# Patient Record
Sex: Male | Born: 1990 | Race: White | Marital: Single | Smoking: Never smoker
Health system: Northeastern US, Academic
[De-identification: ages and names within clinical notes are randomized; demographics above are authoritative.]

## PROBLEM LIST (undated history)

## (undated) DIAGNOSIS — G40909 Epilepsy, unspecified, not intractable, without status epilepticus: Secondary | ICD-10-CM

## (undated) DIAGNOSIS — S069X9A Unspecified intracranial injury with loss of consciousness of unspecified duration, initial encounter: Secondary | ICD-10-CM

## (undated) DIAGNOSIS — J9610 Chronic respiratory failure, unspecified whether with hypoxia or hypercapnia: Secondary | ICD-10-CM

## (undated) DIAGNOSIS — S069XAA Unspecified intracranial injury with loss of consciousness status unknown, initial encounter: Secondary | ICD-10-CM

## (undated) DIAGNOSIS — E119 Type 2 diabetes mellitus without complications: Secondary | ICD-10-CM

## (undated) DIAGNOSIS — Z9911 Dependence on respirator [ventilator] status: Secondary | ICD-10-CM

## (undated) DIAGNOSIS — J45909 Unspecified asthma, uncomplicated: Secondary | ICD-10-CM

## (undated) DIAGNOSIS — R569 Unspecified convulsions: Secondary | ICD-10-CM

## (undated) DIAGNOSIS — G8250 Quadriplegia, unspecified: Secondary | ICD-10-CM

## (undated) HISTORY — PX: SP PERC PLACE GASTRIC TUBE: HXRAD333

## (undated) HISTORY — PX: CSF SHUNT: SHX92

## (undated) HISTORY — PX: TRACHEOSTOMY TUBE PLACEMENT: SHX814

## (undated) HISTORY — DX: Unspecified intracranial injury with loss of consciousness of unspecified duration, initial encounter: S06.9X9A

## (undated) HISTORY — DX: Type 2 diabetes mellitus without complications: E11.9

## (undated) HISTORY — DX: Dependence on respirator (ventilator) status: Z99.11

## (undated) HISTORY — PX: CRANIECTOMY: SHX331

## (undated) HISTORY — PX: SHUNT REVISION: SHX343

## (undated) HISTORY — DX: Chronic respiratory failure, unspecified whether with hypoxia or hypercapnia: J96.10

## (undated) HISTORY — PX: VENTRICULOPERITONEAL SHUNT: SHX204

## (undated) HISTORY — DX: Unspecified intracranial injury with loss of consciousness status unknown, initial encounter: S06.9XAA

## (undated) HISTORY — DX: Unspecified asthma, uncomplicated: J45.909

## (undated) HISTORY — DX: Epilepsy, unspecified, not intractable, without status epilepticus: G40.909

## (undated) NOTE — Progress Notes (Signed)
Formatting of this note is different from the original.  Cardiology Consultation Note:   Etsel presents today accompanied by staff.  He is a 24 year old gentleman with a traumatic brain injury and seizure disorder as well as diabetes who has been followed closely for pulmonary issues.  An x-ray was obtained at some point suggesting the possibility of a tortuous aorta although the specifics are not available and quick review of the x-rays does not reveal significant pathology.  The aide who accompanies him today does not believe he has had any CT scanning lately.  On further history he apparently has been feeling quite well.  He has not had any cardiac specific complaints.  His blood pressure and diabetes have been under good control.  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  At night only  -Asthma  -AODM. Insulin requiring  Meds:  ? ACCU-CHEK AVIVA PLUS TEST STRP Misc Strp USE 1 STRIP BY MISC.(NON-DRUG COMBO ROUTE) ROUTE 3 (THREE) TIMES DAILY AS NEEDED.   ? acetaminophen 160 mg/5 mL (5 mL) Oral suspension (PEDS) Take 15 mg/kg by mouth every 4 (four) hours as needed for Fever.   ? albuterol 2.5 mg /3 mL (0.083 %) Inhl nebulizer solution Take 3 mLs by nebulization every 6 (six) hours as needed for Wheezing.   ? ascorbic acid, vitamin C, 250 mg Oral Chew Take 2 tablets by mouth daily.   ? BACLOFEN 10 MG Oral tablet TAKE 1 TABLET BY MOUTH TWICE A DAY   ? bisacodyl 10 mg Rect suppository Insert one suppository rectally on 3rd day if no BM   ? budesonide 0.25 mg/2 mL Inhl nebulizer solution INHALE 1 VIAL VIA NEBULIZER TWICE DAILY   ? calcium carbonate-vitamin D3 600 mg (1,500 mg)-800 unit Oral Chew Take 1 tablet by mouth daily.   ? carbamide peroxide 6.5 % Otic otic solution use 5 drops twice daily on the first 3 days of each month.   ? cetirizine 10 MG Oral tablet take 1 tablet (10 MILLIGRAM total) by  mouth nightly   ? chlorhexidine 0.12 % MM solution Use as directed 15 mLs in the mouth or throat 2 (two) times daily.   ? cholecalciferol, vitamin D3, 25 mcg (1,000 unit) Oral Chew Take 1,000 Units by mouth.   ? clonazePAM 1 MG Oral tablet 1 mg tablet crushed and put through G tube as needed for cluster seizures (tx no more than 3 back to back sz)   ? dextromethorphan-guaiFENesin 10-100 mg/5 mL Oral syrup Take 10 mLs by mouth every 4 (four) hours if needed for Cough.   ? diabetic supplies, miscellan. Misc Misc Test 6 times daily   ? docusate sodium 50 mg/5 mL Oral liquid take 10 mls (100 MILLIGRAM total) by mouth 2 times daily   ? EPINEPHrine (EPIPEN) 0.3 mg/0.3 mL Inj injection Inject 0.3 mLs into the muscle daily as needed for Anaphylaxis.   ? famotidine 20 MG Oral tablet Take 1 tablet by mouth 2 (two) times daily. Ok to crush it and put it down tube if necessary   ? fluticasone propionate 50 mcg/actuation Nasl nasal spray 1 spray by Nasal route.   ? glucagon HCL 1 mg Inj SolR Inject 1 mg as directed as needed.   ? ibuprofen 600 MG Oral tablet Take 1 tablet by mouth every 8 (eight) hours as needed for Pain.   ? insulin aspart U-100 (NOVOLOG  FLEXPEN U-100 INSULIN) 100 unit/mL (3 mL) SubQ InPn Inject 2 Units into the skin With meals, as needed. (Patient taking differently: Inject 10 Units into the skin With meals, as needed.)   ? inulin (FIBER GUMMIES ORAL) Take 2 tablets by mouth daily.   ? IPRATROPIUM 0.02 % Inhl nebulizer solution TAKE 2.5 MLS BY NEBULIZATION 4 (FOUR) TIMES DAILY.   ? lacosamide 10 mg/mL Oral oral solution Take 10 MILLILITER per tube twice daily. ; MAXIMUM DAILY DOSE 20 MILLILITER   ? LACTULOSE 10 gram/15 mL Oral solution TAKE 15 MLS BY MOUTH 3 (THREE) TIMES DAILY. (Patient taking differently: as needed. TAKE 15 MLS BY MOUTH 3 (THREE) TIMES DAILY.)   ? lancets (ACCU-CHEK SOFTCLIX LANCETS) Misc Misc 1 application by Misc.(Non-Drug; Combo Route) route 3 (three) times daily as needed. DX E11.65   ?  lancing device (LANCING DEVICE WITH LANCETS) Misc Misc 1 each by Misc.(Non-Drug; Combo Route) route 3 (three) times daily. Accuchek fastclix device   ? LEVETIRACETAM 100 mg/mL Oral solution GIVE 15 MLS BY G TUBE ROUTE 2 (TWO) TIMES DAILY.   ? metFORMIN 500 mg/5 mL Oral SSRR administer 5 mls (500 MILLIGRAM total) via g tube 2 times daily (with meals)   ? miconazole 2 % Top powder In groin   ? miscellaneous medical supply Misc Misc Adult diaper- tena  48 inch waist  DX N39.46   ? miscellaneous medical supply Misc Misc Medium gloves- one box  DX N39.46   ? miscellaneous medical supply Misc Misc chux pads  N39.46   ? miscellaneous medical supply Misc Misc Washable chux pads  DX N39.46   ? miscellaneous medical supply Misc Misc Semi electric hospital bed   ? miscellaneous medical supply Misc Misc Patient needs one box of each of the following: 60cc Cath tip piston syringes, 2x2 split gauze, 4x4 Split gauze, Long Q-tips   ? miscellaneous medical supply Misc Misc 60 cc syringes use as needed   ? miscellaneous medical supply Misc Misc Over the bed table.   ? miscellaneous medical supply Misc Misc Alcohol pads   ? miscellaneous medical supply Misc Misc Please dispense single use bullet for Trach/Nebulizer   ? miscellaneous medical supply Misc Misc Please dispense Ceiling Lift to assist with quality of life  Dx: Traumatic Brain Injury S06.9X9S, Tetraplegia G82.50   ? miscellaneous medical supply Misc Misc Please dispense one gel mattress  Dx: U04.9X9A-Traumatic Brain Injury, G82.5-Tetraplegia   ? miscellaneous medical supply Misc Misc Please dispense 14 Fr Kathryne Eriksson with extension/attachments   ? multivit-minerals/folic acid (ADULT MULTIVITAMIN GUMMIES ORAL) Take 2 tablets by mouth daily.   ? nystatin Top powder Apply 1 application topically 4 (four) times daily.   ? pen needle, diabetic (BD ULTRA-FINE MINI PEN NEEDLE) 31 gauge x 3/16" Misc Ndle USE 3 (THREE) TIMES DAILY AS NEEDED.   ? senna 8.6 mg Oral tablet administer  2 tablets via g tube daily   ? sodium chloride 0.9 % IR irrigation IRRITGATE WITH 250 MLS AS DIRECTED THREE TIMES DAILY FOR G-TUBE ADMINISTRATION WHILE ILL   ? tobramycin, PF, (TOBI) 300 mg/5 mL nebulizer solution Inhale 5 mLs into the lungs Every 12 hours.   ? TOUJEO MAX U-300 SOLOSTAR 300 unit/mL (3 mL) SubQ InPn 34 Units at bedtime.   ? VALPROATE 250 mg/5 mL Oral syrup TAKE 5 MLS BY MOUTH 3 (THREE) TIMES DAILY     Allergies:  Allergies   Allergen Reactions   ? Ativan [Lorazepam] Unknown  Unknown   ? Bactrim [Sulfamethoxazole-Trimethoprim] Anaphylaxis     Unknown   ? Zosyn [Piperacillin-Tazobactam] Other (See Comments)     BP drops   ? Vancomycin Rash     unknown     Family and Social History:  Family History   Problem Relation Age of Onset   ? Depression Mother    ? Diabetes Father    ? Heart disease Father    ? Hyperlipidemia Father    ? Hypertension Father    ? Cancer Neg Hx      Social History     Socioeconomic History   ? Marital status: Single     Spouse name: Not on file   ? Number of children: Not on file   ? Years of education: Not on file   ? Highest education level: Not on file   Occupational History   ? Not on file   Tobacco Use   ? Smoking status: Never   ? Smokeless tobacco: Never   Substance and Sexual Activity   ? Alcohol use: No   ? Drug use: Not on file   ? Sexual activity: Not on file   Other Topics Concern   ? Not on file   Social History Narrative   ? Not on file     Social Determinants of Health     Financial Resource Strain: Not on file   Food Insecurity: Not on file   Transportation Needs: Not on file   Physical Activity: Not on file   Stress: Not on file   Social Connections: Not on file   Intimate Partner Violence: Not on file   Housing Stability: Not on file     Review of Systems:  A comprehensive review of systems was performed as follows:   General:  See HPI, HEENT:  Negative, Cardiovascular: See HPI, Respiratory: See HPI,  Musculoskeletal:  Negative, GI: Negative, GU:  Negative,  Skin:  Negative,  Neurologic:  Negative, Psychiatric:  Negative, Endocrine:  Negative, Hematologic/Lymphatic:  Negative,  Allergy/Rheumatologic: Negative,  Other: Negative.     height is 5\' 7"  (1.702 m). His blood pressure is 98/62 and his pulse is 90. His oxygen saturation is 97%.    Wt Readings from Last 3 Encounters:   11/07/17 185 lb (83.9 kg)   09/12/17 185 lb (83.9 kg)   08/23/17 185 lb (83.9 kg)     General Appearance:  No acute distress   Head:  Normocephalic, without obvious abnormality, atraumatic   Eyes:  Non-jaundiced   Neck: Supple,  trachea midline; no carotid bruit. No JVD   Back:   Without obvious scoliosis, lordosis   Lungs:   Clear to auscultation bilaterally, respirations unlabored   Heart:  PMI non-displaced. Regular rate and rhythm, S1 and S2 normal, no murmurs, rub, or gallop.     Abdomen:   Soft, non-tender, bowel sounds active,  no masses, liver and spleen are not enlarged, Aorta does not appear to be enlarged.  No bruits noted   Extremities: Extremities normal, atraumatic, warm with no clubbing, cyanosis or edema   Pulses: Carotids 2+, Radial 2+ Femorals 2+ without bruit, Pedal 2+   Skin: Skin color, texture, turgor normal, no rashes or lesions       Relevant Labs:  No results found for requested labs within last 365 days.     EKG reveals sinus rhythm without acute changes    Assessment:    Keylen presents today accompanied by staff.  He has had  a prior traumatic brain injury and is unable to answer questions.  Per staff apparently some testing was obtained that did suggest the finding that needed to be reviewed.  I did spend time reviewing all tests available including his chest x-rays.  There is a referral suggesting tortuous aorta but at this point I do not see any data to confirm this.  His exam and EKG are unremarkable and according to staff he has not had any cardiac specific symptoms.  There is certainly additional testing that can be done including echocardiography or chest CT  scanning but at this point I think it may be too much and I do not believe any significant cardiac pathology is showing itself at this time.  I would favor a more conservative approach for now and we have opted to hold off on additional testing.  I remain available should any further assistance be required.    Thank you  for allowing me to participate in the care of this patient.     Electronically signed by Jerene Canny, MD at 02/23/2022 11:14 AM EDT

## (undated) NOTE — Telephone Encounter (Signed)
Formatting of this note might be different from the original.  LOV 11/19/18  No upcoming appointments scheduled.  Please schedule an appointment.    Electronically signed by Deirdre Priest, GPN at 04/13/2020  3:37 PM EDT

## (undated) NOTE — Telephone Encounter (Signed)
Formatting of this note might be different from the original.  Dr. Pete Glatter, called pharmacy.  Pharmacy is aware he as a new PCP.  Electronically signed by Kaylyn Lim at 09/19/2019  1:57 PM EST

## (undated) NOTE — Telephone Encounter (Signed)
Formatting of this note is different from the original.  Medication Refill - [facility]   Lab Results   Component Value Date    HBA1C 6.7 (H) 09/14/2017    GLU 114 (H) 10/02/2017    CA 9.0 10/02/2017    NA 133 (L) 10/02/2017    K 3.6 10/02/2017    CL 99 10/02/2017    BUN <5 (L) 10/02/2017    CREAT 0.2 (L) 10/02/2017    AST 43 (H) 09/26/2017     BP Readings from Last 1 Encounters:   11/22/17 118/72      Visit due: Yes  LOV: 11/19/18  No further OV sch    Refill done for: 180 tabs,  1 refills    Labs due: No    Labs pended: No    Prev. orders mailed: No    Notification letter sent to patient: No           AND/OR    Through My Chart, if active: No   Goals    None          Electronically signed by Georgia Duff, LPN at 16/04/9603  3:53 PM EDT

## (undated) NOTE — Telephone Encounter (Signed)
Formatting of this note is different from the original.  LOV 11/20/2018  Medication Refill - [facility]   Lab Results   Component Value Date    HBA1C 6.7 (H) 09/14/2017    GLU 114 (H) 10/02/2017    CA 9.0 10/02/2017    NA 133 (L) 10/02/2017    K 3.6 10/02/2017    CL 99 10/02/2017    BUN <5 (L) 10/02/2017    CREAT 0.2 (L) 10/02/2017    AST 43 (H) 09/26/2017     BP Readings from Last 1 Encounters:   11/22/17 118/72      Visit due: No    Refill done for: 100 tabs,  1 refills    Labs due: No    Labs pended: No    Prev. orders mailed: No    Notification letter sent to patient: No           AND/OR    Through My Chart, if active: No   Goals    None          Electronically signed by Trudi Ida at 05/19/2019 12:35 PM EST

## (undated) NOTE — Telephone Encounter (Signed)
Formatting of this note might be different from the original.  No longer pt here per chart, decline medication.  Electronically signed by Gasper Sells at 04/13/2020  3:49 PM EDT

## (undated) NOTE — Telephone Encounter (Signed)
Formatting of this note might be different from the original.  No longer our patient  Electronically signed by Alinda Dooms, LPN at 16/04/9603 12:49 PM EST

## (undated) NOTE — Telephone Encounter (Signed)
Formatting of this note might be different from the original.  Dr Pete Glatter, please decline. no longer our pt.  Electronically signed by Anastasia Pall, LPN at 16/04/9603  4:43 PM EST

## (undated) NOTE — Telephone Encounter (Signed)
Formatting of this note might be different from the original.  Pt no longer receives care in this office.  Electronically signed by Anastasia Pall, LPN at 16/04/9603  9:25 AM EST

## (undated) NOTE — Telephone Encounter (Signed)
Formatting of this note might be different from the original.  LOV 11/19/18  Electronically signed by Milas Gain at 07/05/2020 12:50 PM EST

## (undated) NOTE — Telephone Encounter (Signed)
Formatting of this note might be different from the original.  LOV 11/19/18    No NOV scheduled  Electronically signed by Kaylyn Lim at 07/01/2019 12:16 PM EST

---
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
## 2015-01-08 ENCOUNTER — Emergency Department: Payer: Medicaid Other

## 2015-01-08 ENCOUNTER — Emergency Department
Admission: EM | Admit: 2015-01-08 | Discharge: 2015-01-08 | Disposition: A | Discharge status: Home / Self Care | Payer: Medicaid Other | Attending: Student | Admitting: Student

## 2015-01-08 DIAGNOSIS — J9501 Hemorrhage from tracheostomy stoma: Secondary | ICD-10-CM | POA: Insufficient documentation

## 2015-01-08 DIAGNOSIS — E119 Type 2 diabetes mellitus without complications: Secondary | ICD-10-CM | POA: Diagnosis not present

## 2015-01-08 DIAGNOSIS — Z880 Allergy status to penicillin: Secondary | ICD-10-CM | POA: Diagnosis not present

## 2015-01-08 DIAGNOSIS — J159 Unspecified bacterial pneumonia: Secondary | ICD-10-CM | POA: Insufficient documentation

## 2015-01-08 DIAGNOSIS — J189 Pneumonia, unspecified organism: Secondary | ICD-10-CM

## 2015-01-08 HISTORY — DX: Unspecified asthma, uncomplicated: J45.909

## 2015-01-08 HISTORY — DX: Unspecified convulsions: R56.9

## 2015-01-08 HISTORY — DX: Unspecified intracranial injury with loss of consciousness of unspecified duration, initial encounter: S06.9X9A

## 2015-01-08 HISTORY — DX: Type 2 diabetes mellitus without complications: E11.9

## 2015-01-08 MED ORDER — LEVOFLOXACIN 750 MG PO TABS
ORAL_TABLET | ORAL | Status: AC
  Filled 2015-01-08: qty 1

## 2015-01-08 MED ORDER — LEVOFLOXACIN 250 MG PO TABS
ORAL_TABLET | ORAL | Status: AC
  Filled 2015-01-08: qty 3

## 2015-01-08 MED ORDER — LEVOFLOXACIN 750 MG PO TABS: 750.0000 mg | ORAL_TABLET | Freq: Every day | ORAL | Status: DC

## 2015-01-08 MED ORDER — LEVOFLOXACIN 750 MG PO TABS
750.0000 mg | ORAL_TABLET | Freq: Once | ORAL | Status: AC
  Administered 2015-01-08: 750 mg

## 2015-01-08 NOTE — ED Provider Notes (Signed)
[facility] Emergency Department Provider Note  ____________________________________________  Time seen: Approximately 3:33 PM  I have reviewed the triage vital signs and the nursing notes.   HISTORY  Chief Complaint Hemoptysis  Caveat-history of present illness and review of systems Limited secondary to the patient's traumatic brain injury. All history of present illness and review of systems obtained from family at bedside as well as the patient's day nurse.  HPI Roger Tate is a 24 y.o. 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 Family at bedside reports approximately 2 days ago, there was concern for aspiration and the patient's day nurse "suctioned him 8 times in one hour". Last night there is a small amount of pink tinged sputum. Today, caregiver suctioned approximately 100 cc of frank blood from the trach however this has nearly resolved. Current severity is mild. Otherwise, he has had no fevers, chills, vomiting, diarrhea. He has been in good spirits. Father has observed him his does not think he is having any kind of pain. Father confirms that his tracheostomy tube was initially placed in 2002. Father does report that one time in the past when the patient had pneumonia he did have some blood from the trach.   Past Medical History  Diagnosis Date  . Brain injury 2002  . Diabetes mellitus without complication   . Seizures   . Asthma     There are no active problems to display for this patient.   Past Surgical History  Procedure Laterality Date  . Brain surgery  2002  . Tracheostomy tube placement    . Sp perc place gastric tube    . Csf shunt      Current Outpatient Rx  Name  Route  Sig  Dispense  Refill  . levofloxacin (LEVAQUIN) 750 MG tablet   Per Tube  Place 1 tablet (750 mg total) into feeding tube daily.   4 tablet   0     Allergies Sulfa antibiotics; Zosyn; and Penicillins  History reviewed. No pertinent family history.  Social History History  Substance Use Topics  . Smoking status: Never Smoker   . Smokeless tobacco: Not on file  . Alcohol Use: No    Review of Systems Constitutional: No fever/chills Gastrointestinal:  No nausea, no vomiting.  No diarrhea.  No constipation. Skin: Negative for rash.   Caveat-history of present illness and review of systems Limited secondary to the patient's tract brain injury. All history of present illness and review of systems obtained from family at bedside as well as the patient's day nurse. ____________________________________________   PHYSICAL EXAM:  VITAL SIGNS: ED Triage Vitals  Enc Vitals Group     BP 01/08/15 1523 105/74 mmHg     Pulse Rate 01/08/15 1523 95     Resp --      Temp 01/08/15 1512 98.4 F (36.9 C)     Temp Source 01/08/15 1512 Oral     SpO2 01/08/15 1523 96 %     Weight 01/08/15 1512 175 lb (79.379 kg)     Height 01/08/15 1512  (1.651 m)     Head Cir --      Peak Flow --      Pain Score 01/08/15 1513 0     Pain Loc --      Pain Edu? --  Excl. in GC? --     Constitutional: Alert, delayed but pleasant cooperative and answers most questions appropriately. Eyes: Conjunctivae are normal. PERRL. EOMI. Head: Atraumatic. Nose: No congestion/rhinnorhea. Mouth/Throat: Mucous membranes are moist.  Oropharynx non-erythematous. Neck: + tracheostomy in anterior neck with no active bleeding. Cardiovascular: Normal rate, regular rhythm. Grossly normal heart sounds.  Good peripheral circulation. Respiratory: Normal respiratory effort.  No retractions. Slightly coarse breath sounds bilaterally Gastrointestinal: Soft and nontender. No distention. No abdominal bruits. No CVA tenderness. Genitourinary: deferred. +wearing adult diaper. Musculoskeletal: No  lower extremity tenderness nor edema.  No joint effusions. Neurologic:  Patient answers most questions appropriately. He follows commands. He has chronic muscle wasting in bilateral upper and lower extremities with limb contractures, clonus in bilateral lower extremities. Skin:  Skin is warm, dry and intact. No rash noted. Psychiatric: Mood and affect are normal. Speech and behavior are normal.  ____________________________________________   LABS (all labs ordered are listed, but only abnormal results are displayed)  Labs Reviewed - No data to display ____________________________________________  EKG  none ____________________________________________  RADIOLOGY  CXR IMPRESSION: 1. Tracheostomy appears in good position. 2. LEFT basilar opacity which may represent atelectasis or airspace disease.  ____________________________________________   PROCEDURES  Procedure(s) performed: None  Critical Care performed: No  ____________________________________________   INITIAL IMPRESSION / ASSESSMENT AND PLAN / ED COURSE  Pertinent labs & imaging results that were available during my care of the patient were reviewed by me and considered in my medical decision making (see chart for details).   Roger Tate is a 24 y.o. 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 On exam, he is nontoxic appearing. Vital signs are stable, he is afebrile. Family at bedside reports that he is at his baseline in terms of mental status, behavior. Respiratory has suction the patient and there is a small amount of blood-tinged mucus from the trach but there is no gross blood. Suspect minor trauma in the setting of over suctioning however at this point no frank lead from the trachea ostomy. Additionally his stoma is very  mature at this point, having been in for 14 years. I discussed plan of care with the parents, we have decided that we will try to afford lab work as the patient is a very difficult stick and it is very unlikely that he has sustained a significant amount of blood loss given that he remains so hemodynamically stable and the bleeding has nearly resolved completely. We'll check a chest x-ray. Anticipate discharge.  ----------------------------------------- 5:55 PM on 01/08/2015 -----------------------------------------  Chest x-ray with possible left basilar opacity. Will Treat with Levaquin. O2 saturation 92-96% which father reports is the patient's baseline O2 saturation. We'll give first dose of Levaquin here. Discussed extensive return precautions with the patient's father and stepmother and they're comfortable with discharge plan. No continued bleeding from the tracheostomy site at this time. ____________________________________________   FINAL CLINICAL IMPRESSION(S) / ED DIAGNOSES  Final diagnoses:  Tracheostomy hemorrhage  Community acquired pneumonia      Gayla DossEryka A Emali Heyward, MD 01/09/15 [phone]

## 2015-01-08 NOTE — Discharge Instructions (Signed)
Roger Tate to return to the emergency department immediately if you again are noting a significant amount of blood from the tracheostomy, he is having fevers, difficulty breathing, if his oxygen level is low, if he is not acting like himself/seems lethargic or is agitated, he is vomiting or having diarrhea, or for any other concerns. Otherwise, see his doctor as soon as possible for follow-up.

## 2015-01-08 NOTE — ED Notes (Signed)
Waiting for EMS transport home, family at bedise

## 2015-01-08 NOTE — ED Notes (Signed)
Pt presents to ED via EMS.  Per EMS, pts home health nurse suctioned approx 100 cc of blood from trach this morning.  Pts home health sts pts HR has been elevated for pt, btw 80-90.  Pt denies pain.

## 2015-01-14 ENCOUNTER — Inpatient Hospital Stay
Admission: EM | Admit: 2015-01-14 | Discharge: 2015-01-19 | DRG: 870 | Disposition: A | Discharge status: Home Health | Payer: Medicaid Other | Attending: Internal Medicine | Admitting: Internal Medicine

## 2015-01-14 ENCOUNTER — Emergency Department: Payer: Medicaid Other

## 2015-01-14 ENCOUNTER — Encounter: Payer: Self-pay | Admitting: Emergency Medicine

## 2015-01-14 DIAGNOSIS — E872 Acidosis: Secondary | ICD-10-CM | POA: Diagnosis present

## 2015-01-14 DIAGNOSIS — Z880 Allergy status to penicillin: Secondary | ICD-10-CM | POA: Diagnosis not present

## 2015-01-14 DIAGNOSIS — Z9889 Other specified postprocedural states: Secondary | ICD-10-CM

## 2015-01-14 DIAGNOSIS — J45909 Unspecified asthma, uncomplicated: Secondary | ICD-10-CM | POA: Diagnosis present

## 2015-01-14 DIAGNOSIS — Z79899 Other long term (current) drug therapy: Secondary | ICD-10-CM | POA: Diagnosis not present

## 2015-01-14 DIAGNOSIS — R561 Post traumatic seizures: Secondary | ICD-10-CM | POA: Diagnosis not present

## 2015-01-14 DIAGNOSIS — M419 Scoliosis, unspecified: Secondary | ICD-10-CM

## 2015-01-14 DIAGNOSIS — M24642 Ankylosis, left hand: Secondary | ICD-10-CM

## 2015-01-14 DIAGNOSIS — Z87820 Personal history of traumatic brain injury: Secondary | ICD-10-CM | POA: Diagnosis not present

## 2015-01-14 DIAGNOSIS — A4102 Sepsis due to Methicillin resistant Staphylococcus aureus: Principal | ICD-10-CM | POA: Diagnosis present

## 2015-01-14 DIAGNOSIS — Z883 Allergy status to other anti-infective agents status: Secondary | ICD-10-CM | POA: Diagnosis not present

## 2015-01-14 DIAGNOSIS — J9601 Acute respiratory failure with hypoxia: Secondary | ICD-10-CM | POA: Diagnosis present

## 2015-01-14 DIAGNOSIS — J9621 Acute and chronic respiratory failure with hypoxia: Secondary | ICD-10-CM | POA: Diagnosis present

## 2015-01-14 DIAGNOSIS — Z982 Presence of cerebrospinal fluid drainage device: Secondary | ICD-10-CM

## 2015-01-14 DIAGNOSIS — J690 Pneumonitis due to inhalation of food and vomit: Secondary | ICD-10-CM | POA: Diagnosis present

## 2015-01-14 DIAGNOSIS — Z7951 Long term (current) use of inhaled steroids: Secondary | ICD-10-CM | POA: Diagnosis not present

## 2015-01-14 DIAGNOSIS — R0902 Hypoxemia: Secondary | ICD-10-CM

## 2015-01-14 DIAGNOSIS — E1165 Type 2 diabetes mellitus with hyperglycemia: Secondary | ICD-10-CM | POA: Diagnosis present

## 2015-01-14 DIAGNOSIS — Z794 Long term (current) use of insulin: Secondary | ICD-10-CM

## 2015-01-14 DIAGNOSIS — J189 Pneumonia, unspecified organism: Secondary | ICD-10-CM

## 2015-01-14 DIAGNOSIS — Z9911 Dependence on respirator [ventilator] status: Secondary | ICD-10-CM | POA: Diagnosis not present

## 2015-01-14 DIAGNOSIS — Z931 Gastrostomy status: Secondary | ICD-10-CM

## 2015-01-14 DIAGNOSIS — Z930 Tracheostomy status: Secondary | ICD-10-CM

## 2015-01-14 DIAGNOSIS — A419 Sepsis, unspecified organism: Secondary | ICD-10-CM

## 2015-01-14 DIAGNOSIS — J181 Lobar pneumonia, unspecified organism: Secondary | ICD-10-CM

## 2015-01-14 DIAGNOSIS — G8220 Paraplegia, unspecified: Secondary | ICD-10-CM | POA: Diagnosis present

## 2015-01-14 DIAGNOSIS — G40909 Epilepsy, unspecified, not intractable, without status epilepticus: Secondary | ICD-10-CM | POA: Diagnosis present

## 2015-01-14 DIAGNOSIS — Z882 Allergy status to sulfonamides status: Secondary | ICD-10-CM | POA: Diagnosis not present

## 2015-01-14 DIAGNOSIS — I959 Hypotension, unspecified: Secondary | ICD-10-CM

## 2015-01-14 LAB — CBC WITH DIFFERENTIAL/PLATELET
BASOS ABS: 0 10*3/uL (ref 0–0.1)
Basophils Relative: 0 %
EOS ABS: 0 10*3/uL (ref 0–0.7)
EOS PCT: 0 %
HCT: 39 % — ABNORMAL LOW (ref 40.0–52.0)
HEMOGLOBIN: 13.3 g/dL (ref 13.0–18.0)
Lymphocytes Relative: 18 %
Lymphs Abs: 2.2 10*3/uL (ref 1.0–3.6)
MCH: 31.5 pg (ref 26.0–34.0)
MCHC: 34.2 g/dL (ref 32.0–36.0)
MCV: 92.1 fL (ref 80.0–100.0)
MONO ABS: 1 10*3/uL (ref 0.2–1.0)
MONOS PCT: 8 %
Neutro Abs: 8.7 10*3/uL — ABNORMAL HIGH (ref 1.4–6.5)
Neutrophils Relative %: 74 %
Platelets: 235 10*3/uL (ref 150–440)
RBC: 4.23 MIL/uL — ABNORMAL LOW (ref 4.40–5.90)
RDW: 13.3 % (ref 11.5–14.5)
WBC: 11.9 10*3/uL — ABNORMAL HIGH (ref 3.8–10.6)

## 2015-01-14 LAB — COMPREHENSIVE METABOLIC PANEL
ALK PHOS: 59 U/L (ref 38–126)
ALT: 108 U/L — AB (ref 17–63)
AST: 104 U/L — ABNORMAL HIGH (ref 15–41)
Albumin: 3.5 g/dL (ref 3.5–5.0)
Anion gap: 12 (ref 5–15)
BUN: 7 mg/dL (ref 6–20)
CO2: 23 mmol/L (ref 22–32)
Calcium: 9.3 mg/dL (ref 8.9–10.3)
Chloride: 100 mmol/L — ABNORMAL LOW (ref 101–111)
Creatinine, Ser: 0.44 mg/dL — ABNORMAL LOW (ref 0.61–1.24)
GFR calc Af Amer: >60 mL/min (ref >60)
GFR calc non Af Amer: >60 mL/min (ref >60)
GLUCOSE: 136 mg/dL — AB (ref 65–99)
Potassium: 3.6 mmol/L (ref 3.5–5.1)
SODIUM: 135 mmol/L (ref 135–145)
Total Bilirubin: 0.8 mg/dL (ref 0.3–1.2)
Total Protein: 8.2 g/dL — ABNORMAL HIGH (ref 6.5–8.1)

## 2015-01-14 LAB — LACTIC ACID, PLASMA
Lactic Acid, Venous: 2.9 mmol/L (ref 0.5–2.0)
Lactic Acid, Venous: 1.9 mmol/L (ref 0.5–2.0)

## 2015-01-14 LAB — EXPECTORATED SPUTUM ASSESSMENT W GRAM STAIN, RFLX TO RESP C

## 2015-01-14 LAB — EXPECTORATED SPUTUM ASSESSMENT W REFEX TO RESP CULTURE

## 2015-01-14 LAB — TSH: TSH: 2.769 u[IU]/mL (ref 0.350–4.500)

## 2015-01-14 LAB — GLUCOSE, CAPILLARY: Glucose-Capillary: 122 mg/dL — ABNORMAL HIGH (ref 65–99)

## 2015-01-14 MED ORDER — ALBUTEROL SULFATE (2.5 MG/3ML) 0.083% IN NEBU: 2.5000 mg | INHALATION_SOLUTION | Freq: Four times a day (QID) | RESPIRATORY_TRACT | Status: DC | PRN

## 2015-01-14 MED ORDER — ONDANSETRON HCL 4 MG PO TABS: 4.0000 mg | ORAL_TABLET | Freq: Four times a day (QID) | ORAL | Status: DC | PRN

## 2015-01-14 MED ORDER — SODIUM CHLORIDE 0.9 % IV SOLN
1.0000 g | Freq: Three times a day (TID) | INTRAVENOUS | Status: DC
  Filled 2015-01-14 (×4): qty 1

## 2015-01-14 MED ORDER — RANITIDINE HCL 75 MG/5ML PO SYRP
150.0000 mg | ORAL_SOLUTION | Freq: Two times a day (BID) | ORAL | Status: DC
  Filled 2015-01-14 (×13): qty 10

## 2015-01-14 MED ORDER — CIPROFLOXACIN IN D5W 400 MG/200ML IV SOLN
400.0000 mg | Freq: Once | INTRAVENOUS | Status: AC
  Administered 2015-01-14: 400 mg via INTRAVENOUS

## 2015-01-14 MED ORDER — ACETAMINOPHEN 325 MG PO TABS
650.0000 mg | ORAL_TABLET | Freq: Four times a day (QID) | ORAL | Status: DC | PRN
  Administered 2015-01-14 – 2015-01-18 (×2): 650 mg via ORAL
  Filled 2015-01-14 (×2): qty 2

## 2015-01-14 MED ORDER — SODIUM CHLORIDE 0.9 % IV SOLN
1000.0000 mL | Freq: Once | INTRAVENOUS | Status: AC
  Administered 2015-01-14: 1000 mL via INTRAVENOUS

## 2015-01-14 MED ORDER — VALPROIC ACID 250 MG PO CAPS
500.0000 mg | ORAL_CAPSULE | Freq: Two times a day (BID) | ORAL | Status: DC
  Filled 2015-01-14 (×3): qty 2

## 2015-01-14 MED ORDER — LINEZOLID 600 MG/300ML IV SOLN
600.0000 mg | Freq: Two times a day (BID) | INTRAVENOUS | Status: DC
  Administered 2015-01-14 – 2015-01-19 (×10): 600 mg via INTRAVENOUS
  Filled 2015-01-14 (×13): qty 300

## 2015-01-14 MED ORDER — CIPROFLOXACIN IN D5W 400 MG/200ML IV SOLN
INTRAVENOUS | Status: AC
  Administered 2015-01-14: 400 mg via INTRAVENOUS
  Filled 2015-01-14: qty 200

## 2015-01-14 MED ORDER — RANITIDINE HCL 150 MG/10ML PO SYRP
150.0000 mg | ORAL_SOLUTION | Freq: Two times a day (BID) | ORAL | Status: DC
  Administered 2015-01-15: 10 mg via ORAL
  Administered 2015-01-15 – 2015-01-19 (×8): 150 mg via ORAL
  Filled 2015-01-14 (×11): qty 10

## 2015-01-14 MED ORDER — VALPROIC ACID 250 MG PO CAPS
750.0000 mg | ORAL_CAPSULE | Freq: Every day | ORAL | Status: DC
  Administered 2015-01-14: 750 mg via ORAL
  Filled 2015-01-14 (×2): qty 3

## 2015-01-14 MED ORDER — ONDANSETRON HCL 4 MG/2ML IJ SOLN: 4.0000 mg | Freq: Four times a day (QID) | INTRAMUSCULAR | Status: DC | PRN

## 2015-01-14 MED ORDER — METHYLPREDNISOLONE SODIUM SUCC 125 MG IJ SOLR
INTRAMUSCULAR | Status: AC
  Filled 2015-01-14: qty 2

## 2015-01-14 MED ORDER — HEPARIN SODIUM (PORCINE) 5000 UNIT/ML IJ SOLN
5000.0000 [IU] | Freq: Three times a day (TID) | INTRAMUSCULAR | Status: DC
  Administered 2015-01-15 – 2015-01-19 (×10): 5000 [IU] via SUBCUTANEOUS
  Filled 2015-01-14 (×9): qty 1

## 2015-01-14 MED ORDER — METHYLPREDNISOLONE SODIUM SUCC 125 MG IJ SOLR: 60.0000 mg | Freq: Four times a day (QID) | INTRAMUSCULAR | Status: DC

## 2015-01-14 MED ORDER — DIPHENHYDRAMINE HCL 50 MG/ML IJ SOLN
25.0000 mg | Freq: Once | INTRAMUSCULAR | Status: AC
  Administered 2015-01-14: 25 mg via INTRAVENOUS

## 2015-01-14 MED ORDER — BACLOFEN 10 MG PO TABS
10.0000 mg | ORAL_TABLET | Freq: Two times a day (BID) | ORAL | Status: DC
  Administered 2015-01-15 – 2015-01-19 (×9): 10 mg via ORAL
  Filled 2015-01-14 (×8): qty 1

## 2015-01-14 MED ORDER — MEROPENEM 1 G IV SOLR
1.0000 g | Freq: Once | INTRAVENOUS | Status: DC
  Filled 2015-01-14: qty 1

## 2015-01-14 MED ORDER — LACTULOSE 10 GM/15ML PO SOLN
20.0000 g | Freq: Two times a day (BID) | ORAL | Status: DC
  Administered 2015-01-15 – 2015-01-18 (×6): 20 g via ORAL
  Filled 2015-01-14 (×5): qty 30

## 2015-01-14 MED ORDER — LEVETIRACETAM 250 MG PO TABS
250.0000 mg | ORAL_TABLET | Freq: Two times a day (BID) | ORAL | Status: DC
  Filled 2015-01-14 (×3): qty 1

## 2015-01-14 MED ORDER — SODIUM CHLORIDE 0.9 % IV SOLN
1.0000 g | Freq: Three times a day (TID) | INTRAVENOUS | Status: DC
  Administered 2015-01-14 – 2015-01-17 (×9): 1 g via INTRAVENOUS
  Filled 2015-01-14 (×13): qty 1

## 2015-01-14 MED ORDER — ACETAMINOPHEN 650 MG RE SUPP: 650.0000 mg | Freq: Four times a day (QID) | RECTAL | Status: DC | PRN

## 2015-01-14 MED ORDER — SODIUM CHLORIDE 0.9 % IV SOLN: 500.0000 mg | Freq: Three times a day (TID) | INTRAVENOUS | Status: DC

## 2015-01-14 MED ORDER — SODIUM CHLORIDE 0.9 % IV SOLN
INTRAVENOUS | Status: DC
  Administered 2015-01-14 – 2015-01-19 (×7): via INTRAVENOUS

## 2015-01-14 MED ORDER — CLONAZEPAM 0.5 MG PO TABS: 0.5000 mg | ORAL_TABLET | Freq: Two times a day (BID) | ORAL | Status: DC | PRN

## 2015-01-14 NOTE — ED Notes (Signed)
Lab notified about missing results for CMP. Spoke with Darl PikesSusan who just came on shift and does not know why it was not ran, but is running the lab work right now.

## 2015-01-14 NOTE — ED Notes (Signed)
Family at bedside. 

## 2015-01-14 NOTE — Progress Notes (Signed)
Pt has a #7 Bivona trach that requires sterile water to maintain a cuff pressure. 3-4 cc's of sterile water per RT and/or RN

## 2015-01-14 NOTE — Progress Notes (Signed)
ANTIBIOTIC CONSULT NOTE - INITIAL  Pharmacy Consult for Meropenem Indication: rule out sepsis, HCAP   Allergies  Allergen Reactions  . Sulfa Antibiotics Anaphylaxis  . Zosyn [Piperacillin-Tazobactam In Dex] Anaphylaxis  . Vancomycin   . Penicillins Rash    Patient Measurements: Height: 5\' 5"  (165.1 cm) Weight: 175 lb (79.379 kg) IBW/kg (Calculated) : 61.5 Adjusted Body Weight:   Vital Signs: Temp: 98.2 F (36.8 C) (07/07 1223) Temp Source: Oral (07/07 1223) BP: 123/88 mmHg (07/07 1800) Pulse Rate: 84 (07/07 1800) Intake/Output from previous day:   Intake/Output from this shift: Total I/O In: 350 [I.V.:250; IV Piggyback:100] Out: -   Labs:  Recent Labs  01/14/15 1228  WBC 11.9*  HGB 13.3  PLT 235  CREATININE 0.44*   Estimated Creatinine Clearance: 140.7 mL/min (by C-G formula based on Cr of 0.44). No results for input(s): VANCOTROUGH, VANCOPEAK, VANCORANDOM, GENTTROUGH, GENTPEAK, GENTRANDOM, TOBRATROUGH, TOBRAPEAK, TOBRARND, AMIKACINPEAK, AMIKACINTROU, AMIKACIN in the last 72 hours.   Microbiology: Recent Results (from the past 720 hour(s))  Culture, expectorated sputum-assessment     Status: None   Collection Time: 01/14/15 12:45 PM  Result Value Ref Range Status   Specimen Description ENDOTRACHEAL  Final   Special Requests NONE  Final   Sputum evaluation THIS SPECIMEN IS ACCEPTABLE FOR SPUTUM CULTURE  Final   Report Status 01/14/2015 FINAL  Final    Medical History: Past Medical History  Diagnosis Date  . Brain injury 2002  . Diabetes mellitus without complication   . Seizures   . Asthma     Medications:  Scheduled:  . heparin  5,000 Units Subcutaneous 3 times per day  . levETIRAcetam  250 mg Oral BID  . linezolid  600 mg Intravenous Q12H  . meropenem (MERREM) IV  1 g Intravenous 3 times per day   Assessment: CrCl = 140.7 ml/min Pt has listed anaphylaxis to Zosyn,  Incidence of cross sensitivity between PCN's and carbapenems is ~ 1 %.  MD  aware of PCN allergy.  Goal of Therapy:  resolution of infection  Plan:  Expected duration 7 days with resolution of temperature and/or normalization of WBC  Meropenem 500 mg IV Q8H originally ordered.  Will adjust dose to meropenem 1 gm IV Q8H based on indication (HCAP).   Aviella Disbrow D 01/14/2015,6:31 PM

## 2015-01-14 NOTE — ED Provider Notes (Signed)
[facility] Emergency Department Provider Note  ____________________________________________  Time seen: On arrival, via EMS  I have reviewed the triage vital signs and the nursing notes.   HISTORY  Chief Complaint Respiratory Distress  History limited secondary to traumatic brain injury  HPI Georgios Kina is a 24 y.o. male who presents with shortness of breath/hypoxia. Patient with a history of TBI, with a tracheostomy who is on a vent at night. Reportedly patient seen in the emergency department last week and started on antibiotics for possible pneumonia. This morning home health nurse unable to disconnect patient from the event. She reports he would immediately desaturate to the 30s and turned blue. Father seems to think that an outpatient course of Cipro will fix this. Patient with multiple allergies     Past Medical History  Diagnosis Date  . Brain injury 2002  . Diabetes mellitus without complication   . Seizures   . Asthma     There are no active problems to display for this patient.   Past Surgical History  Procedure Laterality Date  . Brain surgery  2002  . Tracheostomy tube placement    . Sp perc place gastric tube    . Csf shunt      Current Outpatient Rx  Name  Route  Sig  Dispense  Refill  . levofloxacin (LEVAQUIN) 750 MG tablet   Per Tube   Place 1 tablet (750 mg total) into feeding tube daily.   4 tablet   0     Allergies Sulfa antibiotics; Zosyn; Vancomycin; and Penicillins  History reviewed. No pertinent family history.  Social History History  Substance Use Topics  . Smoking status: Never Smoker   . Smokeless tobacco: Not on file  . Alcohol Use: No    Review of Systems  L5 caveat: Patient  unable to provide full review of symptoms secondary to past TBI ROS per father  Father does not report fever at home. Positive shortness of breath No vomiting or diarrhea No  rash ____________________________________________   PHYSICAL EXAM:  VITAL SIGNS: ED Triage Vitals  Enc Vitals Group     BP 01/14/15 1223 107/74 mmHg     Pulse Rate 01/14/15 1223 96     Resp 01/14/15 1223 23     Temp 01/14/15 1223 98.2 F (36.8 C)     Temp Source 01/14/15 1223 Oral     SpO2 01/14/15 1217 98 %     Weight 01/14/15 1223 175 lb (79.379 kg)     Height 01/14/15 1223  (1.651 m)     Head Cir --      Peak Flow --      Pain Score --      Pain Loc --      Pain Edu? --      Excl. in GC? --      Constitutional: Alert, ill-appearing Eyes: Conjunctivae are normal.  ENT   Head: Normocephalic and atraumatic.   Mouth/Throat: Mucous membranes are dry Cardiovascular: Normal rate, regular rhythm. Normal and symmetric distal pulses are present in all extremities. No murmurs, rubs, or gallops. Respiratory: Positive Rales bilaterally. Tachypnea. Patient being bagged by EMS via trach Gastrointestinal: Soft and non-tender in all quadrants. No distention. There is no CVA tenderness. Genitourinary: deferred Musculoskeletal: Atrophy of bilateral lower extremities, no erythema or rash  Skin:  Skin is warm, damp and intact. No rash noted.   ____________________________________________    LABS (pertinent positives/negatives)  Labs Reviewed  CBC WITH  DIFFERENTIAL/PLATELET - Abnormal; Notable for the following:    WBC 11.9 (*)    RBC 4.23 (*)    HCT 39.0 (*)    Neutro Abs 8.7 (*)    All other components within normal limits  CULTURE, BLOOD (ROUTINE X 2)  CULTURE, BLOOD (ROUTINE X 2)  URINE CULTURE  CULTURE, EXPECTORATED SPUTUM-ASSESSMENT  LACTIC ACID, PLASMA  LACTIC ACID, PLASMA  COMPREHENSIVE METABOLIC PANEL  URINALYSIS COMPLETEWITH MICROSCOPIC ([facility] ONLY)    ____________________________________________   EKG  ED ECG REPORT I, Jene EveryKINNER, Vinay Ertl, the attending physician, personally viewed and interpreted this ECG.   Date: 01/14/2015  EKG Time: 12:18 PM   Rate: 87  Rhythm: normal sinus rhythm  Axis: Normal  Intervals:none  ST&T Change: Normal   ____________________________________________    RADIOLOGY I have personally reviewed any xrays that were ordered on this patient: Significant left-sided opacity on chest x-ray  ____________________________________________   PROCEDURES  Procedure(s) performed: none  Critical Care performed: yes  CRITICAL CARE Performed by: Jene EveryKINNER, Maddock Finigan   Total critical care time:35  Critical care time was exclusive of separately billable procedures and treating other patients.  Critical care was necessary to treat or prevent imminent or life-threatening deterioration.  Critical care was time spent personally by me on the following activities: development of treatment plan with patient and/or surrogate as well as nursing, discussions with consultants, evaluation of patient's response to treatment, examination of patient, obtaining history from patient or surrogate, ordering and performing treatments and interventions, ordering and review of laboratory studies, ordering and review of radiographic studies, pulse oximetry and re-evaluation of patient's condition.   ____________________________________________   INITIAL IMPRESSION / ASSESSMENT AND PLAN / ED COURSE  Pertinent labs & imaging results that were available during my care of the patient were reviewed by me and considered in my medical decision making (see chart for details).  Initial impression patient has worsening pneumonia/sepsis. Based on the vent with 100% FiO2 to keep sats above 90. Father is very hesitant for me to start an IV or to give IV antibiotics because he is worried about allergic reaction. In fact he became upset with me when I ordered antibiotics without discussing with him first. We agreed to wait for the x-ray results as a compromise  ----------------------------------------- 1:34 PM on  01/14/2015 -----------------------------------------  X-ray shows worsening opacity on the left. Blood pressure has declined to 80s over 50s. I have convinced father to allow me to give antibiotics. Given many allergies we will start meropenem and ciprofloxacin for suspected pneumonia. We will give 30 MLS per kilogram normal saline and recheck blood pressure for presumed sepsis  ----------------------------------------- 2:07 PM on 01/14/2015 -----------------------------------------  Blood pressure has improved to 116/67 with IV fluids. Lactic acid of 2.9.  ____________________________________________   FINAL CLINICAL IMPRESSION(S) / ED DIAGNOSES  Final diagnoses:  Pneumonia, organism unspecified  Sepsis, due to unspecified organism     Jene Everyobert Cayle Thunder, MD 01/14/15 1439

## 2015-01-14 NOTE — ED Notes (Signed)
Family requesting that Meropenem be started after transfer to ICU is complete in case he has any reaction to the medication on the way up there.

## 2015-01-14 NOTE — ED Notes (Signed)
Spoke with admitting doctor that patient's family is refusing him to get Solumedrol. Verbal order given to discontinue medication.

## 2015-01-14 NOTE — H&P (Signed)
[facility] - [place] at [facility]   PATIENT NAME: Roger Tate    MR#:  [id number]  DATE OF BIRTH:  [birth year]  DATE OF ADMISSION:  01/14/2015  PRIMARY CARE PHYSICIAN: Dorothey Baseman, MD   REQUESTING/REFERRING PHYSICIAN:   CHIEF COMPLAINT:   Chief Complaint  Patient presents with  . Respiratory Distress    HISTORY OF PRESENT ILLNESS: Roger Tate  is a 24 y.o. male with a known history of multiple medical problems including TBI, status post ventriculoperitoneal shunt, tracheostomy, PEG tube placement, chronic respiratory failure on ventilator at nighttime at home , who presents to the hospital with complaints of hemoptysis and cough with sputum production. Apparently patient had an aspiration episode of week ago after which a he was suctioned vigorously by a home health nurse, leading to mild hemoptysis approximately a week ago. Patient was seen in the emergency room, he was prescribed levofloxacin after he was noted to have left lower lobe pneumonia on chest x-ray. He finished his levofloxacin course, however, has been having problems with cough as well as shortness of breath. Patient's family noted that he still has continuous hemoptysis. Also is not able to talk and the has been coughing vigorously. His phlegm is thick white to yellow in color. There was no wheezing, but patient's lungs sounded rhonchorous. He also has a raspy voice. He has been having somewhat decreased appetite and is afraid to swallow and even to speak. Today in the morning his oxygen saturations were found to be 30 percent by a home health nurse.  He was brought back to emergency room for further evaluation with repeat the chest x-ray revealed worsening left lower lobe pneumonia and hospitalist services were contacted for admission. Patient himself is not able to provide review of systems because of his TBI and altered mental status due to medications and sepsis  PAST MEDICAL HISTORY:   Past  Medical History  Diagnosis Date  . Brain injury 2002  . Diabetes mellitus without complication   . Seizures   . Asthma     PAST SURGICAL HISTORY:  Past Surgical History  Procedure Laterality Date  . Brain surgery  2002  . Tracheostomy tube placement    . Sp perc place gastric tube    . Csf shunt      SOCIAL HISTORY:  History  Substance Use Topics  . Smoking status: Never Smoker   . Smokeless tobacco: Not on file  . Alcohol Use: No    FAMILY HISTORY: History reviewed. No pertinent family history.  DRUG ALLERGIES:  Allergies  Allergen Reactions  . Sulfa Antibiotics Anaphylaxis  . Zosyn [Piperacillin-Tazobactam In Dex] Anaphylaxis  . Vancomycin   . Penicillins Rash    Review of Systems  Unable to perform ROS: critical illness    MEDICATIONS AT HOME:  Prior to Admission medications   Medication Sig Start Date End Date Taking? Authorizing Provider  albuterol (PROVENTIL) (2.5 MG/3ML) 0.083% nebulizer solution Inhale 3 mLs into the lungs 4 (four) times daily as needed. 09/15/14  Yes Historical Provider, MD  Ascorbic Acid (VITAMIN C) 1000 MG tablet Take 1 tablet by mouth daily.   Yes Historical Provider, MD  baclofen (LIORESAL) 10 MG tablet Take 1 tablet by mouth 2 (two) times daily.   Yes Historical Provider, MD  bisacodyl (LAXATIVE) 10 MG suppository Place 1 suppository rectally at bedtime as needed.   Yes Historical Provider, MD  budesonide (PULMICORT) 0.25 MG/2ML nebulizer solution Inhale 2 mLs into the lungs every 12 (  twelve) hours.   Yes Historical Provider, MD  calcium carbonate (TUMS EX) 750 MG chewable tablet Chew 1 tablet by mouth daily.   Yes Historical Provider, MD  cetirizine (ZYRTEC) 10 MG tablet Take 10 mg by mouth as needed for allergies.   Yes Historical Provider, MD  clonazePAM (KLONOPIN) 1 MG tablet Take 1 mg by mouth as needed for anxiety.   Yes Historical Provider, MD  Dextromethorphan-Guaifenesin (GUAIFENESIN DM) 400-20 MG TABS Take 1 tablet by mouth  every 12 (twelve) hours.   Yes Historical Provider, MD  diphenhydrAMINE (BENADRYL) 25 mg capsule Take 1 tablet by mouth daily.   Yes Historical Provider, MD  EPINEPHrine (EPIPEN JR) 0.15 MG/0.3ML injection Inject 0.15 mg into the muscle as needed.   Yes Historical Provider, MD  FIBER PO Take 1 Dose by mouth daily.   Yes Historical Provider, MD  glucagon (GLUCAGON EMERGENCY) 1 MG injection Inject 1 mg into the muscle as needed.   Yes Historical Provider, MD  insulin aspart (NOVOLOG) 100 UNIT/ML injection Inject 2 Units into the skin as needed for high blood sugar. Prn before meals for blood glucose greater than 200   Yes Historical Provider, MD  ketoconazole (NIZORAL) 2 % cream Apply 1 application topically daily.   Yes Historical Provider, MD  lactulose (CHRONULAC) 10 GM/15ML solution Take 30 mLs by mouth 2 (two) times daily.   Yes Historical Provider, MD  levETIRAcetam (KEPPRA) 250 MG tablet Take 250 mg by mouth 2 (two) times daily.   Yes Historical Provider, MD  levofloxacin (LEVAQUIN) 750 MG tablet Place 1 tablet (750 mg total) into feeding tube daily. 01/09/15  Yes Gayla DossEryka A Gayle, MD  neomycin-bacitracin-polymyxin (NEOSPORIN) ophthalmic ointment 1 application daily.   Yes Historical Provider, MD  nystatin ointment (MYCOSTATIN) 1 application daily.   Yes Historical Provider, MD  ranitidine (ZANTAC) 75 MG/5ML syrup Take 10 mLs by mouth 2 (two) times daily. 12/21/14  Yes Historical Provider, MD  valproic acid (DEPAKENE) 250 MG capsule Take 500 mg by mouth 2 (two) times daily.   Yes Historical Provider, MD  valproic acid (DEPAKENE) 250 MG capsule Take 750 mg by mouth at bedtime.   Yes Historical Provider, MD      PHYSICAL EXAMINATION:   VITAL SIGNS: Blood pressure 116/67, pulse 80, temperature 98.2 F (36.8 C), temperature source Oral, resp. rate 20, height 5\' 5"  (1.651 m), weight 79.379 kg (175 lb), SpO2 98 %.  GENERAL:  24 y.o.-year-old patient lying in the bed in moderate respiratory distress.  Tachypneic, using accessory muscles to breathe is very somnolent but able to open his eyes and mumble by himself ineligibly.  EYES: Pupils equal, round, reactive to light and accommodation. No scleral icterus. Extraocular muscles intact.  HEENT: Head atraumatic, normocephalic. Oropharynx and nasopharynx clear. Right frontoparietal area indentation from old injury NECK:  Supple, no jugular venous distention. No thyroid enlargement, no tenderness.  LUNGS: Somewhat diminished breath sounds bilaterally, no wheezing , bilateral diffuse rales and rhonchi , also crepitations. Using accessory muscles of respiration, tachypneic and uncomfortable disconcordant movements of his chest and abdomen.  CARDIOVASCULAR: S1, S2 normal. No murmurs, rubs, or gallops.  ABDOMEN: Soft, nontender, nondistended. Bowel sounds present. No organomegaly or mass.  EXTREMITIES: No pedal edema, cyanosis, or clubbing.  NEUROLOGIC: Cranial nerves II through XII grossly intact. Muscle strength in all extremities. Not able to assess as patient is poorly responsive, able to open his eyes only, but not converse. Sensation grossly intact. Gait not checked. Diffuse tremor. 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 Briefly opens his eyes but does not converse  SKIN: No obvious rash, lesion, or ulcer.   LABORATORY PANEL:   CBC  Recent Labs Lab 01/14/15 1228  WBC 11.9*  HGB 13.3  HCT 39.0*  PLT 235  MCV 92.1  MCH 31.5  MCHC 34.2  RDW 13.3  LYMPHSABS 2.2  MONOABS 1.0  EOSABS 0.0  BASOSABS 0.0   ------------------------------------------------------------------------------------------------------------------  Chemistries  No results for input(s): NA, K, CL, CO2, GLUCOSE, BUN, CREATININE, CALCIUM, MG, AST, ALT, ALKPHOS, BILITOT in the last 168 hours.  Invalid input(s):  GFRCGP ------------------------------------------------------------------------------------------------------------------  Cardiac Enzymes No results for input(s): TROPONINI in the last 168 hours. ------------------------------------------------------------------------------------------------------------------  RADIOLOGY: Dg Chest Port 1 View  01/14/2015   CLINICAL DATA:  Respiratory distress  EXAM: PORTABLE CHEST - 1 VIEW  COMPARISON:  01/08/2015  FINDINGS: Study is limited by patient rotation. Tracheostomy tube is unchanged in position. Persistent opacity in left lower lobe which may represent atelectasis or infiltrate with worsening from prior exam. Right lung is clear. No convincing pulmonary edema.  IMPRESSION: Stable tracheostomy tube position. Again noted opacity in left lower lobe which may represent atelectasis or infiltrate with worsening from prior exam. Limited study by patient rotation.   Electronically Signed   By: Natasha Mead M.D.   On: 01/14/2015 13:01    EKG: No orders found for this or any previous visit.  IMPRESSION AND PLAN:  Principal Problem:   Sepsis Active Problems:   Acute on chronic respiratory failure   Left lower lobe pneumonia   Hypotension   S/P ventriculoperitoneal shunt   H/O tracheostomy   S/P percutaneous endoscopic gastrostomy (PEG) tube placement   Scoliosis   Ankylosis of left hand  1. Sepsis. Admit patient to medical floor, get blood cultures as well as sputum cultures, start him on broad-spectrum antibiotic therapy with meropenem and Zyvox. Following culture results 2. Acute on chronic respiratory failure. Continue mechanical ventilation. Get pulmonary consultation for further recommendations. Continue oxygen therapy at 100% FiO2, keeping pulse oximetry around 94% and above 3. Left lower lobe pneumonia. Sputum cultures will be taken. Continue antibiotic therapy with meropenem and Zyvox as above 4. Hypotension, questionable septic shock, continue IV  fluids, keeping MAP at above 65 5. Leukocytosis. Continue to follow with therapy 6. Metabolic acidosis. Get BMP and the continue IV fluids following perfusion. Follow lactic acid level serially  All the records are reviewed and case discussed with ED provider. Management plans discussed with the patient, family and they are in agreement.  CODE STATUS:  Full code  TOTAL TIME TAKING CARE OF THIS PATIENT: 1 hour 15 minutes minutes.   Prolonged discussion with patient's family about the patient's care Amarachukwu Lakatos M.D on 01/14/2015 at 3:21 PM  Between 7am to 6pm - Pager - [phone] After 6pm go to [email or web address] - password [email or web address] [facility]  [place] [facility]  Office  [phone]  CC: Primary care physician; Dorothey Baseman, MD

## 2015-01-14 NOTE — ED Notes (Signed)
Pt arrived via EMS from home. Pt has recent trauma from suctioning of his trach tube last week and developed pneumonia.  He just finished a round of Levaquin yesterday.  On Saturday he c/o sore throat and rash. He was given some Zyrtec and it cleared up.  Reported that he was talking yesterday and not talking today which family says he does for attention at times.  At home after he was taking off the ventilator his oxygen saturation was in the 40s and 50s and became cyanotic.  EMS did bag mask him and was able to breathe on his own with RR of 22/min and 98%.

## 2015-01-15 LAB — GLUCOSE, CAPILLARY
GLUCOSE-CAPILLARY: 131 mg/dL — AB (ref 65–99)
Glucose-Capillary: 116 mg/dL — ABNORMAL HIGH (ref 65–99)
Glucose-Capillary: 166 mg/dL — ABNORMAL HIGH (ref 65–99)

## 2015-01-15 LAB — CBC
HCT: 36.7 % — ABNORMAL LOW (ref 40.0–52.0)
Hemoglobin: 12.5 g/dL — ABNORMAL LOW (ref 13.0–18.0)
MCH: 31.7 pg (ref 26.0–34.0)
MCHC: 34 g/dL (ref 32.0–36.0)
MCV: 93.4 fL (ref 80.0–100.0)
Platelets: 230 10*3/uL (ref 150–440)
RBC: 3.93 MIL/uL — ABNORMAL LOW (ref 4.40–5.90)
RDW: 13 % (ref 11.5–14.5)
WBC: 10.9 10*3/uL — AB (ref 3.8–10.6)

## 2015-01-15 LAB — EXPECTORATED SPUTUM ASSESSMENT W REFEX TO RESP CULTURE: SPECIAL REQUESTS: NORMAL

## 2015-01-15 LAB — BASIC METABOLIC PANEL
Anion gap: 9 (ref 5–15)
BUN: <5 mg/dL — ABNORMAL LOW (ref 6–20)
CO2: 25 mmol/L (ref 22–32)
Calcium: 9.1 mg/dL (ref 8.9–10.3)
Chloride: 109 mmol/L (ref 101–111)
Creatinine, Ser: 0.45 mg/dL — ABNORMAL LOW (ref 0.61–1.24)
GLUCOSE: 130 mg/dL — AB (ref 65–99)
Potassium: 4.3 mmol/L (ref 3.5–5.1)
Sodium: 143 mmol/L (ref 135–145)

## 2015-01-15 LAB — URINALYSIS COMPLETE WITH MICROSCOPIC (ARMC ONLY)
BACTERIA UA: NONE SEEN
Bilirubin Urine: NEGATIVE
Glucose, UA: NEGATIVE mg/dL
HGB URINE DIPSTICK: NEGATIVE
Ketones, ur: NEGATIVE mg/dL
LEUKOCYTES UA: NEGATIVE
Nitrite: NEGATIVE
Protein, ur: NEGATIVE mg/dL
RBC / HPF: NONE SEEN RBC/hpf (ref 0–5)
Specific Gravity, Urine: 1.005 (ref 1.005–1.030)
Squamous Epithelial / LPF: NONE SEEN
WBC, UA: NONE SEEN WBC/hpf (ref 0–5)
pH: 7 (ref 5.0–8.0)

## 2015-01-15 LAB — MRSA PCR SCREENING: MRSA by PCR: NEGATIVE

## 2015-01-15 LAB — HEMOGLOBIN A1C: Hgb A1c MFr Bld: 5.6 % (ref 4.0–6.0)

## 2015-01-15 LAB — EXPECTORATED SPUTUM ASSESSMENT W GRAM STAIN, RFLX TO RESP C

## 2015-01-15 MED ORDER — FREE WATER
25.0000 mL | Status: DC
Start: 2015-01-15 — End: 2015-01-19
  Administered 2015-01-15: 25 mL

## 2015-01-15 MED ORDER — VALPROIC ACID 250 MG/5ML PO SYRP
750.0000 mg | ORAL_SOLUTION | Freq: Every day | ORAL | Status: DC
  Administered 2015-01-15 – 2015-01-18 (×4): 750 mg
  Filled 2015-01-15 (×6): qty 15

## 2015-01-15 MED ORDER — JEVITY 1.2 CAL PO LIQD
1000.0000 mL | ORAL | Status: DC
  Administered 2015-01-15: 1000 mL

## 2015-01-15 MED ORDER — CETYLPYRIDINIUM CHLORIDE 0.05 % MT LIQD
7.0000 mL | Freq: Four times a day (QID) | OROMUCOSAL | Status: DC
  Administered 2015-01-15 – 2015-01-19 (×18): 7 mL via OROMUCOSAL

## 2015-01-15 MED ORDER — LEVETIRACETAM 100 MG/ML PO SOLN
250.0000 mg | Freq: Two times a day (BID) | ORAL | Status: DC
  Administered 2015-01-15 – 2015-01-18 (×7): 250 mg
  Filled 2015-01-15 (×11): qty 2.5

## 2015-01-15 MED ORDER — ALBUTEROL SULFATE (2.5 MG/3ML) 0.083% IN NEBU
2.5000 mg | INHALATION_SOLUTION | RESPIRATORY_TRACT | Status: DC
  Administered 2015-01-15 – 2015-01-19 (×24): 2.5 mg via RESPIRATORY_TRACT
  Filled 2015-01-15 (×25): qty 3

## 2015-01-15 MED ORDER — BUDESONIDE 0.5 MG/2ML IN SUSP
0.5000 mg | Freq: Two times a day (BID) | RESPIRATORY_TRACT | Status: DC
Start: 2015-01-15 — End: 2015-01-19
  Administered 2015-01-15 – 2015-01-19 (×8): 0.5 mg via RESPIRATORY_TRACT
  Filled 2015-01-15 (×8): qty 2

## 2015-01-15 MED ORDER — CHLORHEXIDINE GLUCONATE 0.12 % MT SOLN
15.0000 mL | Freq: Two times a day (BID) | OROMUCOSAL | Status: DC
  Administered 2015-01-15 – 2015-01-19 (×9): 15 mL via OROMUCOSAL

## 2015-01-15 MED ORDER — VALPROIC ACID 250 MG/5ML PO SYRP
500.0000 mg | ORAL_SOLUTION | Freq: Two times a day (BID) | ORAL | Status: DC
Start: 2015-01-15 — End: 2015-01-19
  Administered 2015-01-15 – 2015-01-19 (×9): 500 mg
  Filled 2015-01-15 (×13): qty 10

## 2015-01-15 NOTE — Progress Notes (Addendum)
Patient stable overnight on vent through chronic tracheostomy. Assist control rate 10/tv350/o80%/peep5. Condom cath for urinary incontinence with adequate output. VSS. SR on monitor. NS@125  2 pivs. Chronic peg tube. See charting for assessments.

## 2015-01-15 NOTE — Progress Notes (Addendum)
ANTIBIOTIC CONSULT NOTE - FOLLOW UP   Pharmacy Consult for Meropenem and Electrolyte  Indication: rule out sepsis, HCAP, Electrolyte management   Allergies  Allergen Reactions  . Sulfa Antibiotics Anaphylaxis  . Zosyn [Piperacillin-Tazobactam In Dex] Anaphylaxis  . Vancomycin   . Penicillins Rash    Patient Measurements: Height: 5\' 5"  (165.1 cm) Weight: 161 lb 6 oz (73.2 kg) IBW/kg (Calculated) : 61.5 Adjusted Body Weight:   Vital Signs: Temp: 99.8 F (37.7 C) (07/08 0740) Temp Source: Oral (07/08 0740) BP: 129/84 mmHg (07/08 0700) Pulse Rate: 87 (07/08 0700) Intake/Output from previous day: 07/07 0701 - 07/08 0700 In: 2589.6 [I.V.:1789.6; IV Piggyback:600] Out: 1300 [Urine:1300] Intake/Output from this shift:    Labs:  Recent Labs  01/14/15 1228 01/15/15 0637  WBC 11.9* 10.9*  HGB 13.3 12.5*  PLT 235 230  CREATININE 0.44* 0.45*   Estimated Creatinine Clearance: 126 mL/min (by C-G formula based on Cr of 0.45). No results for input(s): VANCOTROUGH, VANCOPEAK, VANCORANDOM, GENTTROUGH, GENTPEAK, GENTRANDOM, TOBRATROUGH, TOBRAPEAK, TOBRARND, AMIKACINPEAK, AMIKACINTROU, AMIKACIN in the last 72 hours.   Microbiology: Recent Results (from the past 720 hour(s))  Blood Culture (routine x 2)     Status: None (Preliminary result)   Collection Time: 01/14/15 12:28 PM  Result Value Ref Range Status   Specimen Description BLOOD  Final   Special Requests NONE  Final   Culture NO GROWTH < 24 HOURS  Final   Report Status PENDING  Incomplete  Culture, expectorated sputum-assessment     Status: None   Collection Time: 01/14/15 12:45 PM  Result Value Ref Range Status   Specimen Description ENDOTRACHEAL  Final   Special Requests NONE  Final   Sputum evaluation THIS SPECIMEN IS ACCEPTABLE FOR SPUTUM CULTURE  Final   Report Status 01/14/2015 FINAL  Final  Blood Culture (routine x 2)     Status: None (Preliminary result)   Collection Time: 01/14/15  1:15 PM  Result Value Ref  Range Status   Specimen Description BLOOD LH  Final   Special Requests BOTTLES DRAWN AEROBIC AND ANAEROBIC 5ML  Final   Culture NO GROWTH < 24 HOURS  Final   Report Status PENDING  Incomplete  MRSA PCR Screening     Status: None   Collection Time: 01/14/15  5:25 PM  Result Value Ref Range Status   MRSA by PCR NEGATIVE NEGATIVE Final    Comment:        The GeneXpert MRSA Assay (FDA approved for NASAL specimens only), is one component of a comprehensive MRSA colonization surveillance program. It is not intended to diagnose MRSA infection nor to guide or monitor treatment for MRSA infections.     Medical History: Past Medical History  Diagnosis Date  . Brain injury 2002  . Diabetes mellitus without complication   . Seizures   . Asthma     Medications:  Scheduled:  . antiseptic oral rinse  7 mL Mouth Rinse QID  . baclofen  10 mg Oral BID  . chlorhexidine  15 mL Mouth Rinse BID  . heparin  5,000 Units Subcutaneous 3 times per day  . lactulose  20 g Oral BID  . levETIRAcetam  250 mg Per Tube BID  . linezolid  600 mg Intravenous Q12H  . meropenem (MERREM) IV  1 g Intravenous 3 times per day  . ranitidine  150 mg Oral BID  . Valproic Acid  500 mg Per Tube BID  . Valproic Acid  750 mg Per Tube QHS  Assessment: CrCl = 140.7 ml/min Pt has listed anaphylaxis to Zosyn,  Incidence of cross sensitivity between PCN's and carbapenems is ~ 1 %.  MD aware of PCN allergy.  Electrolytes within normal range Goal of Therapy:  resolution of infection  Plan:  Expected duration 7 days with resolution of temperature and/or normalization of WBC  1) Merrem: Will continue Merrem 1 g q8 hours. 2) Electrolytes: All wnl. Will order electrolyte panel with am labs.    Victoriana Aziz D 01/15/2015,8:35 AM

## 2015-01-15 NOTE — Clinical Social Work Note (Signed)
Clinical Social Worker received consult for transportation needs. Per chart review, pt is not ready for discharge at this time. Pt will likely need EMS transportation. Will re-assess when closer to discharge. Please call with any urgent needs. CSW will continue to follow.   Dede QuerySarah Zacari Stiff, MSW, LCSW Clinical Social Worker [phone]

## 2015-01-15 NOTE — Progress Notes (Signed)
Initial Nutrition Assessment     INTERVENTION:   Coordination of Care: discussed nutritional poc with MD Arrie EasternKasa, Pam M RN and during ICU rounds; received verbal order to start TF via PEG tube while on vent. Agree with SLP consult for diet advancement once off vent support EN: recommend Jevity 1.2 TF at rate of 55 ml/hr with Prostat TID Providing 119 g of protein, 1884 kcals, 1369 ml of free water. Recommend free water flush of 25 ml/hr (additional 600 mL of free water) at present. Continue to assess  NUTRITION DIAGNOSIS:  Inadequate oral intake related to inability to eat, acute illness as evidenced by NPO status.  GOAL:  Provide needs based on ASPEN/SCCM guidelines   MONITOR:   (EN, Energy Intake, Anthropometrics, Digestive System, Electrolyte/Renal profile, Glucose profile)  REASON FOR ASSESSMENT:  Ventilator, Consult Enteral/tube feeding initiation and management  ASSESSMENT:  Pt admitted with acute respiratory failure, LLL pneumonia, currently on vent via trach. Pt with trach and PEG. Noted SLP evaluation ordered.    PMHx:  Past Medical History  Diagnosis Date  . Brain injury 2002  . Diabetes mellitus without complication   . Seizures   . Asthma     Diet Order: NPO  Current Nutrition: NPO  Food/Nutrition-Related History: Family at bedside and reports that at home PEG tube is used for meds and water, no tube feeding. Pt eats regular diet at home, family reports he does have trouble with liquids, drinks too quickly. Of note, family states that in the past has not tolerated bolus feedings well.   Medications:  NS at 125 ml/hr, lactulose  Electrolyte/Renal Profile and Glucose Profile:   Recent Labs Lab 01/14/15 1228 01/15/15 0637  NA 135 143  K 3.6 4.3  CL 100* 109  CO2 23 25  BUN 7 <5*  CREATININE 0.44* 0.45*  CALCIUM 9.3 9.1  GLUCOSE 136* 130*   Protein Profile:   Recent Labs Lab 01/14/15 1228  ALBUMIN 3.5    Gastrointestinal Profile: PEG tube  in place Last BM: unknown   Nutrition-Focused Physical Exam Findings:  Unable to complete Nutrition-Focused physical exam at this time.    Weight Change: family reports pt weight has been stable Anthropometrics:  Height:  Ht Readings from Last 1 Encounters:  01/14/15 5\' 5"  (1.651 m)    Weight:  Wt Readings from Last 1 Encounters:  01/15/15 161 lb 6 oz (73.2 kg)     BMI:  Body mass index is 26.85 kg/(m^2).  Estimated Nutritional Needs:   Kcal:  1910 kcals (BEE 1654, Ve: 7.7, Tmax: 37.7) using current wt of 73.2 kg  Protein:  110-146 g (1.5-2.0 g/kg)   Fluid:  2190-2555 mL(30-35 ml/kg)   Skin:  Reviewed, no issues  Diet Order:  Diet NPO time specified  EDUCATION NEEDS:  No education needs identified at this time   Intake/Output Summary (Last 24 hours) at 01/15/15 1159 Last data filed at 01/15/15 0619  Gross per 24 hour  Intake 2589.58 ml  Output   1300 ml  Net 1289.58 ml    HIGH Care Level  Romelle Starcherate Anahlia Iseminger MS, RD, LDN [phone] Pager

## 2015-01-15 NOTE — Consult Note (Signed)
PULMONARY / CRITICAL CARE MEDICINE   Name: Genia HotterJoshua Hardacre MRN: [id number] DOB: 27-Mar-1991    ADMISSION DATE:  01/14/2015   CHIEF COMPLAINT:     Acute resp failure  HISTORY OF PRESENT ILLNESS  24 yo white male paraplegic admitted to ICU for acute on chronic resp failure, patient has chronic trach  Placed on full vent support yesterday for acute and severe  Hypoxic resp failure  Patient off sedation, on PRVC fio2 at 80%, patient father at bedside,states that he has had low grade fevers with secretions Patient started on Iv abx, sputum cultures pending     SIGNIFICANT EVENTS   On vent 7/7    PAST MEDICAL HISTORY    :  Past Medical History  Diagnosis Date  . Brain injury 2002  . Diabetes mellitus without complication   . Seizures   . Asthma    Past Surgical History  Procedure Laterality Date  . Brain surgery  2002  . Tracheostomy tube placement    . Sp perc place gastric tube    . Csf shunt     Prior to Admission medications   Medication Sig Start Date End Date Taking? Authorizing Provider  albuterol (PROVENTIL) (2.5 MG/3ML) 0.083% nebulizer solution Inhale 3 mLs into the lungs 4 (four) times daily as needed. 09/15/14  Yes Historical Provider, MD  Ascorbic Acid (VITAMIN C) 1000 MG tablet Take 1 tablet by mouth daily.   Yes Historical Provider, MD  baclofen (LIORESAL) 10 MG tablet Take 1 tablet by mouth 2 (two) times daily.   Yes Historical Provider, MD  bisacodyl (LAXATIVE) 10 MG suppository Place 1 suppository rectally at bedtime as needed.   Yes Historical Provider, MD  budesonide (PULMICORT) 0.25 MG/2ML nebulizer solution Inhale 2 mLs into the lungs every 12 (twelve) hours.   Yes Historical Provider, MD  calcium carbonate (TUMS EX) 750 MG chewable tablet Chew 1 tablet by mouth daily.   Yes Historical Provider, MD  cetirizine (ZYRTEC) 10 MG tablet Take 10 mg by mouth as needed for allergies.   Yes Historical Provider, MD  clonazePAM (KLONOPIN) 1 MG tablet Take 1 mg by  mouth as needed for anxiety.   Yes Historical Provider, MD  Dextromethorphan-Guaifenesin (GUAIFENESIN DM) 400-20 MG TABS Take 1 tablet by mouth every 12 (twelve) hours.   Yes Historical Provider, MD  diphenhydrAMINE (BENADRYL) 25 mg capsule Take 1 tablet by mouth daily.   Yes Historical Provider, MD  EPINEPHrine (EPIPEN JR) 0.15 MG/0.3ML injection Inject 0.15 mg into the muscle as needed.   Yes Historical Provider, MD  FIBER PO Take 1 Dose by mouth daily.   Yes Historical Provider, MD  glucagon (GLUCAGON EMERGENCY) 1 MG injection Inject 1 mg into the muscle as needed.   Yes Historical Provider, MD  insulin aspart (NOVOLOG) 100 UNIT/ML injection Inject 2 Units into the skin as needed for high blood sugar. Prn before meals for blood glucose greater than 200   Yes Historical Provider, MD  ketoconazole (NIZORAL) 2 % cream Apply 1 application topically daily.   Yes Historical Provider, MD  lactulose (CHRONULAC) 10 GM/15ML solution Take 30 mLs by mouth 2 (two) times daily.   Yes Historical Provider, MD  levETIRAcetam (KEPPRA) 250 MG tablet Take 250 mg by mouth 2 (two) times daily.   Yes Historical Provider, MD  levofloxacin (LEVAQUIN) 750 MG tablet Place 1 tablet (750 mg total) into feeding tube daily. 01/09/15  Yes Gayla DossEryka A Gayle, MD  neomycin-bacitracin-polymyxin (NEOSPORIN) ophthalmic ointment 1 application daily.  Yes Historical Provider, MD  nystatin ointment (MYCOSTATIN) 1 application daily.   Yes Historical Provider, MD  ranitidine (ZANTAC) 75 MG/5ML syrup Take 10 mLs by mouth 2 (two) times daily. 12/21/14  Yes Historical Provider, MD  valproic acid (DEPAKENE) 250 MG capsule Take 500 mg by mouth 2 (two) times daily.   Yes Historical Provider, MD  valproic acid (DEPAKENE) 250 MG capsule Take 750 mg by mouth at bedtime.   Yes Historical Provider, MD   Allergies  Allergen Reactions  . Sulfa Antibiotics Anaphylaxis  . Zosyn [Piperacillin-Tazobactam In Dex] Anaphylaxis  . Vancomycin   . Penicillins  Rash     FAMILY HISTORY   History reviewed. No pertinent family history.    SOCIAL HISTORY    reports that he has never smoked. He does not have any smokeless tobacco history on file. He reports that he does not drink alcohol. His drug history is not on file.  Review of Systems  Unable to perform ROS: critical illness      VITAL SIGNS    Temp:  [98.2 F (36.8 C)-99.8 F (37.7 C)] 99.8 F (37.7 C) (07/08 0740) Pulse Rate:  [67-96] 82 (07/08 1000) Resp:  [16-24] 22 (07/08 1000) BP: (65-150)/(49-106) 122/75 mmHg (07/08 1000) SpO2:  [89 %-100 %] 91 % (07/08 1000) FiO2 (%):  [70 %-100 %] 80 % (07/08 0746) Weight:  [161 lb 6 oz (73.2 kg)-175 lb (79.379 kg)] 161 lb 6 oz (73.2 kg) (07/08 0619) HEMODYNAMICS:   VENTILATOR SETTINGS: Vent Mode:  [-] PRVC FiO2 (%):  [70 %-100 %] 80 % Set Rate:  [10 bmp] 10 bmp Vt Set:  [350 mL] 350 mL PEEP:  [5 cmH20] 5 cmH20 INTAKE / OUTPUT:  Intake/Output Summary (Last 24 hours) at 01/15/15 1106 Last data filed at 01/15/15 0619  Gross per 24 hour  Intake 2589.58 ml  Output   1300 ml  Net 1289.58 ml       PHYSICAL EXAM   Physical Exam  Constitutional: No distress.  Chronic trach  HENT:  Head: Normocephalic and atraumatic.  Eyes: Pupils are equal, round, and reactive to light. No scleral icterus.  Neck: Normal range of motion. Neck supple.  Cardiovascular: Normal rate and regular rhythm.   No murmur heard. Pulmonary/Chest: He is in respiratory distress. He has no wheezes. He has rales.  resp distress  Abdominal: Soft. He exhibits no distension. There is no tenderness.  Musculoskeletal: He exhibits no edema.  contracted UE and LE ext  Neurological: He displays normal reflexes. Coordination normal.  gcs<8T  Skin: Skin is warm. No rash noted. He is diaphoretic.       LABS   LABS:  CBC  Recent Labs Lab 01/14/15 1228 01/15/15 0637  WBC 11.9* 10.9*  HGB 13.3 12.5*  HCT 39.0* 36.7*  PLT 235 230   Coag's No  results for input(s): APTT, INR in the last 168 hours. BMET  Recent Labs Lab 01/14/15 1228 01/15/15 0637  NA 135 143  K 3.6 4.3  CL 100* 109  CO2 23 25  BUN 7 <5*  CREATININE 0.44* 0.45*  GLUCOSE 136* 130*   Electrolytes  Recent Labs Lab 01/14/15 1228 01/15/15 0637  CALCIUM 9.3 9.1   Sepsis Markers  Recent Labs Lab 01/14/15 1227 01/14/15 1543  LATICACIDVEN 2.9* 1.9   ABG No results for input(s): PHART, PCO2ART, PO2ART in the last 168 hours. Liver Enzymes  Recent Labs Lab 01/14/15 1228  AST 104*  ALT 108*  ALKPHOS 59  BILITOT  0.8  ALBUMIN 3.5   Cardiac Enzymes No results for input(s): TROPONINI, PROBNP in the last 168 hours. Glucose  Recent Labs Lab 01/14/15 1839 01/15/15 0003 01/15/15 0640  GLUCAP 122* 131* 116*     Recent Results (from the past 240 hour(s))  Blood Culture (routine x 2)     Status: None (Preliminary result)   Collection Time: 01/14/15 12:28 PM  Result Value Ref Range Status   Specimen Description BLOOD  Final   Special Requests NONE  Final   Culture NO GROWTH < 24 HOURS  Final   Report Status PENDING  Incomplete  Culture, expectorated sputum-assessment     Status: None   Collection Time: 01/14/15 12:45 PM  Result Value Ref Range Status   Specimen Description ENDOTRACHEAL  Final   Special Requests NONE  Final   Sputum evaluation THIS SPECIMEN IS ACCEPTABLE FOR SPUTUM CULTURE  Final   Report Status 01/14/2015 FINAL  Final  Culture, respiratory (NON-Expectorated)     Status: None (Preliminary result)   Collection Time: 01/14/15 12:45 PM  Result Value Ref Range Status   Specimen Description ENDOTRACHEAL  Final   Special Requests NONE Reflexed from [id number]  Final   Gram Stain PENDING  Incomplete   Culture   Final    MODERATE GROWTH STAPHYLOCOCCUS AUREUS LIGHT GROWTH PROTEUS MIRABILIS SUSCEPTIBILITIES TO FOLLOW ONCE ISOLATED    Report Status PENDING  Incomplete  Blood Culture (routine x 2)     Status: None (Preliminary  result)   Collection Time: 01/14/15  1:15 PM  Result Value Ref Range Status   Specimen Description BLOOD LH  Final   Special Requests BOTTLES DRAWN AEROBIC AND ANAEROBIC  Final   Culture  Setup Time   Final    GRAM POSITIVE COCCI ANAEROBIC BOTTLE ONLY IDENTIFICATION TO FOLLOW CRITICAL RESULT CALLED TO, READ BACK BY AND VERIFIED WITH: PAM MYERS ON 01/15/15 AT 1100AM BY JEF    Culture GRAM POSITIVE COCCI IDENTIFICATION TO FOLLOW   Final   Report Status PENDING  Incomplete  MRSA PCR Screening     Status: None   Collection Time: 01/14/15  5:25 PM  Result Value Ref Range Status   MRSA by PCR NEGATIVE NEGATIVE Final    Comment:        The GeneXpert MRSA Assay (FDA approved for NASAL specimens only), is one component of a comprehensive MRSA colonization surveillance program. It is not intended to diagnose MRSA infection nor to guide or monitor treatment for MRSA infections.   Urine culture     Status: None (Preliminary result)   Collection Time: 01/15/15  2:12 AM  Result Value Ref Range Status   Specimen Description URINE, CLEAN CATCH  Final   Special Requests NONE  Final   Culture NO GROWTH < 12 HOURS  Final   Report Status PENDING  Incomplete     Current facility-administered medications:  .  0.9 %  sodium chloride infusion, , Intravenous, Continuous, Katharina Caper, MD, Last Rate: 125 mL/hr at 01/15/15 1014 .  acetaminophen (TYLENOL) tablet 650 mg, 650 mg, Oral, Q6H PRN, 650 mg at 01/14/15 2326 **OR** acetaminophen (TYLENOL) suppository 650 mg, 650 mg, Rectal, Q6H PRN, Katharina Caper, MD .  albuterol (PROVENTIL) (2.5 MG/3ML) 0.083% nebulizer solution 2.5 mg, 2.5 mg, Nebulization, Q6H PRN, Katharina Caper, MD .  antiseptic oral rinse (CPC / CETYLPYRIDINIUM CHLORIDE 0.05%) solution 7 mL, 7 mL, Mouth Rinse, QID, Oralia Manis, MD, 7 mL at 01/15/15 0315 .  baclofen (LIORESAL) tablet 10 mg,  10 mg, Oral, BID, Oralia Manis, MD, 10 mg at 01/15/15 0314 .  chlorhexidine (PERIDEX) 0.12 %  solution 15 mL, 15 mL, Mouth Rinse, BID, Oralia Manis, MD, 15 mL at 01/15/15 0800 .  clonazePAM (KLONOPIN) tablet 0.5 mg, 0.5 mg, Oral, BID PRN, Katharina Caper, MD .  heparin injection 5,000 Units, 5,000 Units, Subcutaneous, 3 times per day, Katharina Caper, MD, 5,000 Units at 01/14/15 1716 .  lactulose (CHRONULAC) 10 GM/15ML solution 20 g, 20 g, Oral, BID, Oralia Manis, MD, 20 g at 01/15/15 0314 .  levETIRAcetam (KEPPRA) 100 MG/ML solution 250 mg, 250 mg, Per Tube, BID, Oralia Manis, MD, 250 mg at 01/15/15 0315 .  linezolid (ZYVOX) IVPB 600 mg, 600 mg, Intravenous, Q12H, Katharina Caper, MD, 600 mg at 01/15/15 1014 .  meropenem (MERREM) 1 g in sodium chloride 0.9 % 100 mL IVPB, 1 g, Intravenous, 3 times per day, Katharina Caper, MD, 1 g at 01/15/15 0558 .  ondansetron (ZOFRAN) tablet 4 mg, 4 mg, Oral, Q6H PRN **OR** ondansetron (ZOFRAN) injection 4 mg, 4 mg, Intravenous, Q6H PRN, Katharina Caper, MD .  ranitidine (ZANTAC) 150 MG/10ML syrup 150 mg, 150 mg, Oral, BID, Katharina Caper, MD, 150 mg at 01/15/15 0314 .  Valproic Acid (DEPAKENE) 250 MG/5ML syrup SYRP 500 mg, 500 mg, Per Tube, BID, Oralia Manis, MD, 500 mg at 01/15/15 0559 .  Valproic Acid (DEPAKENE) 250 MG/5ML syrup SYRP 750 mg, 750 mg, Per Tube, QHS, Oralia Manis, MD  IMAGING    Dg Chest Port 1 View  01/14/2015   CLINICAL DATA:  Respiratory distress  EXAM: PORTABLE CHEST - 1 VIEW  COMPARISON:  01/08/2015  FINDINGS: Study is limited by patient rotation. Tracheostomy tube is unchanged in position. Persistent opacity in left lower lobe which may represent atelectasis or infiltrate with worsening from prior exam. Right lung is clear. No convincing pulmonary edema.  IMPRESSION: Stable tracheostomy tube position. Again noted opacity in left lower lobe which may represent atelectasis or infiltrate with worsening from prior exam. Limited study by patient rotation.   Electronically Signed   By: Natasha Mead M.D.   On: 01/14/2015 13:01      Indwelling  Urinary Catheter continued, requirement due to   Reason to continue Indwelling Urinary Catheter for strict Intake/Output monitoring for hemodynamic instability         Ventilator continued, requirement due to, resp failure    Ventilator Sedation RASS 0 to -2      ASSESSMENT/PLAN   24 yo white male with paraplegia with acute on chronic resp failure from acute hypoxic resp failure from pneumonia  PULMONARY -Respiratory Failure -continue Full MV support -continue Bronchodilator Therapy -Wean Fio2 and PEEP as tolerated -Pneumonia-Iv abx and cehc sputum cultures  CARDIOVASCULAR Continue ICU monitoring  RENAL Follow chem 7  GASTROINTESTINAL -start Tf's   INFECTIOUS -check sputum cultures -started on zyvox and merepenem  ENDOCRINE Follow fsbs  NEUROLOGIC -sedation as needed  Plan to wean fio2 as tolerated and assess for PS mode    I have personally obtained a history, examined the patient, evaluated laboratory and imaging results, formulated the assessment and plan and placed orders.  The Patient requires high complexity decision making for assessment and support, frequent evaluation and titration of therapies, application of advanced monitoring technologies and extensive interpretation of multiple databases. Critical Care Time devoted to patient care services described in this note is 45 minutes.   Overall, patient is critically ill, prognosis is guarded. Patient at high risk for cardiac arrest  and death.   Lucie Leather, M.D. Pulmonary & Critical Care Medicine [facility] Medical Director Intensive Care Unit   01/15/2015, 11:06 AM

## 2015-01-15 NOTE — Progress Notes (Signed)
[facility] [place] [facility]   PATIENT NAME: Roger Tate    MR#:  [id number]  DATE OF BIRTH:  Feb 15, 1991  SUBJECTIVE:  CHIEF COMPLAINT: Presented with respiratory distress, currently on vent. Dad at bedside   REVIEW OF SYSTEMS:  Unobtainable  DRUG ALLERGIES:   Allergies  Allergen Reactions  . Sulfa Antibiotics Anaphylaxis  . Zosyn [Piperacillin-Tazobactam In Dex] Anaphylaxis  . Vancomycin   . Penicillins Rash    VITALS:  Blood pressure 95/57, pulse 108, temperature 98.5 F (36.9 C), temperature source Oral, resp. rate 21, height 5\' 5"  (1.651 m), weight 73.2 kg (161 lb 6 oz), SpO2 94 %.  PHYSICAL EXAMINATION:  GENERAL:  24 y.o.-year-old patient lying in the bed with no acute distress.  EYES: Pupils equal, round, reactive to light and accommodation. No scleral icterus.  HEENT: Head atraumatic, normocephalic.  NECK:  Supple, no jugular venous distention. Trach site is intact LUNGS: Coarse breath sounds bilaterally, no wheezing, rales,rhonchi or crepitation. No use of accessory muscles of respiration.  CARDIOVASCULAR: S1, S2 normal. No murmurs, rubs, or gallops.  ABDOMEN: Soft, nontender, nondistended. Bowel sounds present. No organomegaly or mass.  EXTREMITIES: No pedal edema, cyanosis, or clubbing.  NEUROLOGIC: History of brain injury  PSYCHIATRIC: The patient is alert  SKIN: No obvious rash, lesion    LABORATORY PANEL:   CBC  Recent Labs Lab 01/15/15 0637  WBC 10.9*  HGB 12.5*  HCT 36.7*  PLT 230   ------------------------------------------------------------------------------------------------------------------  Chemistries   Recent Labs Lab 01/14/15 1228 01/15/15 0637  NA 135 143  K 3.6 4.3  CL 100* 109  CO2 23 25  GLUCOSE 136* 130*  BUN 7 <5*  CREATININE 0.44* 0.45*  CALCIUM 9.3 9.1  AST 104*  --   ALT 108*  --   ALKPHOS 59  --   BILITOT 0.8  --     ------------------------------------------------------------------------------------------------------------------  Cardiac Enzymes No results for input(s): TROPONINI in the last 168 hours. ------------------------------------------------------------------------------------------------------------------  RADIOLOGY:  Dg Chest Port 1 View  01/14/2015   CLINICAL DATA:  Respiratory distress  EXAM: PORTABLE CHEST - 1 VIEW  COMPARISON:  01/08/2015  FINDINGS: Study is limited by patient rotation. Tracheostomy tube is unchanged in position. Persistent opacity in left lower lobe which may represent atelectasis or infiltrate with worsening from prior exam. Right lung is clear. No convincing pulmonary edema.  IMPRESSION: Stable tracheostomy tube position. Again noted opacity in left lower lobe which may represent atelectasis or infiltrate with worsening from prior exam. Limited study by patient rotation.   Electronically Signed   By: Natasha MeadLiviu  Pop M.D.   On: 01/14/2015 13:01    EKG:   Orders placed or performed during the hospital encounter of 01/14/15  . EKG 12-Lead  . EKG 12-Lead    ASSESSMENT AND PLAN:   1. Sepsis. Admit patient to ICU , follow up blood cultures as well as sputum cultures,  on broad-spectrum antibiotic therapy with meropenem and Zyvox. 2. Acute on chronic respiratory failure. Continue mechanical ventilation. Follow-up with pulmonary consultation for further recommendations. Continue oxygen therapy at 60% FiO2, keeping pulse oximetry around 94% and above 3. Left lower lobe pneumonia. Sputum cultures will be taken. Continue antibiotic therapy with meropenem and Zyvox as above 4. Hypotension, questionable septic shock, continue IV fluids, keeping MAP at above 65 5. Leukocytosis. Continue to follow with therapy 6. Metabolic acidosis. Get BMP and the continue IV fluids following perfusion. Follow lactic acid level serially 7. Nutrition  via tube feeds-follow  up with speech  pathology.      All the records are reviewed and case discussed with Care Management/Social Workerr. Management plans discussed with the patient, family and they are in agreement.  CODE STATUS: full  TOTAL critical care  TIME TAKING CARE OF THIS PATIENT: 35 minutes.   POSSIBLE D/C IN 2-3 DAYS, DEPENDING ON CLINICAL CONDITION.   Ramonita Lab M.D on 01/15/2015 at 3:03 PM  Between 7am to 6pm - Pager - [phone] After 6pm go to [email or web address] - password [email or web address] [facility]  [facility]  Office  [phone]  CC: Primary care physician; Dorothey Baseman, MD

## 2015-01-15 NOTE — Progress Notes (Signed)
   01/15/15 1445  Clinical Encounter Type  Visited With Patient and family together  Visit Type Initial  Consult/Referral To Chaplain  Spiritual Encounters  Spiritual Needs Emotional  Stress Factors  Patient Stress Factors None identified  Family Stress Factors None identified  Chaplain rounded in the unit and introduced self to patient and family. Offered supportive and compassionate presence. Chaplain Madden Garron A. Arren Laminack Ext. [phone]

## 2015-01-15 NOTE — Progress Notes (Signed)
Speech Therapy Note: received order, reviewed chart notes. Met w/ Father who reported pt does eat a "regular" diet at home w/ tracheostomy and at times when on vent support. [name] diet includes thin liquids. Father indicated aspiration episodes occur and that he encourages pt to cough "hard", and pt is tracheally suctioned when nec. Discussed w/ Father that while pt remains on vent support (which is not his usual baseline at home), SLP does not rec. attempting any oral foods/liquids d/t increased risk for aspiration. Father agreed. Met w/ MD who agreed that while pt is still not at his baseline and requiring vent support, no po's or oral diet will be attempted. Suggested that pt will be at his best w/ reduced risk for aspiration when his medical status has improved and he is not requiring vent support. Pt does have a PEG and is receiving PEG TFs at this time. NSG updated and agreed. ST will f/u w/ pt's status.

## 2015-01-16 LAB — BASIC METABOLIC PANEL
Anion gap: 7 (ref 5–15)
CALCIUM: 8.9 mg/dL (ref 8.9–10.3)
CHLORIDE: 108 mmol/L (ref 101–111)
CO2: 24 mmol/L (ref 22–32)
CREATININE: 0.34 mg/dL — AB (ref 0.61–1.24)
GLUCOSE: 166 mg/dL — AB (ref 65–99)
POTASSIUM: 3.8 mmol/L (ref 3.5–5.1)
SODIUM: 139 mmol/L (ref 135–145)

## 2015-01-16 LAB — GLUCOSE, CAPILLARY: Glucose-Capillary: 175 mg/dL — ABNORMAL HIGH (ref 65–99)

## 2015-01-16 LAB — MAGNESIUM: MAGNESIUM: 1.9 mg/dL (ref 1.7–2.4)

## 2015-01-16 LAB — URINE CULTURE: Culture: NO GROWTH

## 2015-01-16 LAB — C DIFFICILE QUICK SCREEN W PCR REFLEX
C DIFFICILE (CDIFF) INTERP: NEGATIVE
C DIFFICILE (CDIFF) TOXIN: NEGATIVE
C DIFFICLE (CDIFF) ANTIGEN: NEGATIVE

## 2015-01-16 LAB — PHOSPHORUS: Phosphorus: 4.1 mg/dL (ref 2.5–4.6)

## 2015-01-16 MED ORDER — JEVITY 1.2 CAL PO LIQD
1000.0000 mL | ORAL | Status: DC
  Administered 2015-01-17 – 2015-01-18 (×2): 1000 mL
  Filled 2015-01-16: qty 1000

## 2015-01-16 MED ORDER — PRO-STAT SUGAR FREE PO LIQD
30.0000 mL | Freq: Three times a day (TID) | ORAL | Status: DC
  Administered 2015-01-16 – 2015-01-19 (×10): 30 mL via ORAL

## 2015-01-16 NOTE — Plan of Care (Signed)
Problem: Phase II Progression Outcomes Goal: Review culture results with MD if needed Outcome: Progressing Dr Amado CoeGouru notified at 1350 regarding moderate growth MRSA sputum result. Pt placed on contact isolation.  On zyvox and merrem Goal: Discharge plan established Outcome: Progressing Spoke with care manager.  Pt has [facility] and home vent and supplies.  Will need transport at discharge Goal: Tolerating diet Outcome: Progressing TF at 55/hr.  Having bowel movements.

## 2015-01-16 NOTE — Progress Notes (Signed)
[facility] [place] [facility]   PATIENT NAME: Genia HotterJoshua Ditommaso    MR#:  [id number]  DATE OF BIRTH:  1991/02/03  SUBJECTIVE:  CHIEF COMPLAINT: Presented with respiratory distress, currently on vent. Dad at bedside   REVIEW OF SYSTEMS:  Unobtainable  DRUG ALLERGIES:   Allergies  Allergen Reactions  . Sulfa Antibiotics Anaphylaxis  . Zosyn [Piperacillin-Tazobactam In Dex] Anaphylaxis  . Vancomycin   . Penicillins Rash    VITALS:  Blood pressure 150/96, pulse 93, temperature 98.5 F (36.9 C), temperature source Oral, resp. rate 18, height 5\' 5"  (1.651 m), weight 72.7 kg (160 lb 4.4 oz), SpO2 98 %.  PHYSICAL EXAMINATION:  GENERAL:  24 y.o.-year-old patient lying in the bed with no acute distress.  EYES: Pupils equal, round, reactive to light and accommodation. No scleral icterus.  HEENT: Head atraumatic, normocephalic.  NECK:  Supple, no jugular venous distention. Trach site is intact LUNGS: Coarse breath sounds bilaterally, no wheezing, rales,rhonchi or crepitation. No use of accessory muscles of respiration.  CARDIOVASCULAR: S1, S2 normal. No murmurs, rubs, or gallops.  ABDOMEN: Soft, nontender, nondistended. Bowel sounds present. No organomegaly or mass.  EXTREMITIES: No pedal edema, cyanosis, or clubbing.  NEUROLOGIC: History of brain injury  PSYCHIATRIC: The patient is alert  SKIN: No obvious rash, lesion    LABORATORY PANEL:   CBC  Recent Labs Lab 01/15/15 0637  WBC 10.9*  HGB 12.5*  HCT 36.7*  PLT 230   ------------------------------------------------------------------------------------------------------------------  Chemistries   Recent Labs Lab 01/14/15 1228  01/16/15 0808  NA 135  < > 139  K 3.6  < > 3.8  CL 100*  < > 108  CO2 23  < > 24  GLUCOSE 136*  < > 166*  BUN 7  < > <5*  CREATININE 0.44*  < > 0.34*  CALCIUM 9.3  < > 8.9  MG  --   --  1.9  AST 104*  --   --   ALT 108*  --   --   ALKPHOS 59  --   --    BILITOT 0.8  --   --   < > = values in this interval not displayed. ------------------------------------------------------------------------------------------------------------------  Cardiac Enzymes No results for input(s): TROPONINI in the last 168 hours. ------------------------------------------------------------------------------------------------------------------  RADIOLOGY:  No results found.  EKG:   Orders placed or performed during the hospital encounter of 01/14/15  . EKG 12-Lead  . EKG 12-Lead    ASSESSMENT AND PLAN:   1. Sepsis  -  sputum cultures with Proteus,  on broad-spectrum antibiotic therapy with meropenem and Zyvox. Continue current treatment 2. Acute on chronic respiratory failure with hypoxia with a concern for aspiration. Continue mechanical ventilation. Follow-up with pulmonary consultation for further recommendations. Continue oxygen therapy at 40% FiO2, titrate to wean off  3. Left lower lobe pneumonia probably from aspiration. Continue antibiotic therapy with meropenem and Zyvox as above SLP reports possible aspiration still upon evaluation 4. Hypotension, questionable septic shock, better today ,continue IV fluids, keeping MAP at above 65 5. Leukocytosis. Continue to follow with therapy 6. Hyperglycemia-probably stress-induced-sliding scale 7. Nutrition  via tube feeds-follow up with speech pathology.  father insists on taking him home tomorrow  -Case Worker to assess if it is safe  to consider discharge  Plan of care discussed with the patient's father, RN and pulmonology All the records are reviewed and case discussed with Care Management/Social Workerr. Management plans discussed with the patient, family and they are  in agreement.  CODE STATUS: full  TOTAL critical care  TIME TAKING CARE OF THIS PATIENT: 35 minutes.   POSSIBLE D/C IN 1-2  DAYS, DEPENDING ON CLINICAL CONDITION.   Ramonita Lab M.D on 01/16/2015 at 1:50 PM  Between 7am to 6pm -  Pager - [phone] After 6pm go to [email or web address] - password [email or web address] [facility]  [facility]  Office  [phone]  CC: Primary care physician; Dorothey Baseman, MD

## 2015-01-16 NOTE — Progress Notes (Signed)
ANTIBIOTIC CONSULT NOTE - FOLLOW UP   Pharmacy Consult for Meropenem and Electrolyte  Indication: rule out sepsis, HCAP, Electrolyte management   Allergies  Allergen Reactions  . Sulfa Antibiotics Anaphylaxis  . Zosyn [Piperacillin-Tazobactam In Dex] Anaphylaxis  . Vancomycin   . Penicillins Rash    Patient Measurements: Height:  (165.1 cm) Weight: 160 lb 4.4 oz (72.7 kg) IBW/kg (Calculated) : 61.5 Adjusted Body Weight:   Vital Signs: Temp: 99 F (37.2 C) (07/09 0712) Temp Source: Oral (07/09 0712) BP: 113/70 mmHg (07/09 1000) Pulse Rate: 100 (07/09 1000) Intake/Output from previous day: 07/08 0701 - 07/09 0700 In: 2768.8 [I.V.:1500; NG/GT:368.8; IV Piggyback:900] Out: 550 [Urine:550] Intake/Output from this shift: Total I/O In: 450 [I.V.:375; NG/GT:75] Out: -   Labs:  Recent Labs  01/14/15 1228 01/15/15 0637 01/16/15 0808  WBC 11.9* 10.9*  --   HGB 13.3 12.5*  --   PLT 235 230  --   CREATININE 0.44* 0.45* 0.34*   Estimated Creatinine Clearance: 126 mL/min (by C-Tate formula based on Cr of 0.34). No results for input(s): VANCOTROUGH, VANCOPEAK, VANCORANDOM, GENTTROUGH, GENTPEAK, GENTRANDOM, TOBRATROUGH, TOBRAPEAK, TOBRARND, AMIKACINPEAK, AMIKACINTROU, AMIKACIN in the last 72 hours.   Microbiology: Recent Results (from the past 720 hour(s))  Blood Culture (routine x 2)     Status: None (Preliminary result)   Collection Time: 01/14/15 12:28 PM  Result Value Ref Range Status   Specimen Description BLOOD  Final   Special Requests NONE  Final   Culture NO GROWTH < 24 HOURS  Final   Report Status PENDING  Incomplete  Culture, expectorated sputum-assessment     Status: None   Collection Time: 01/14/15 12:45 PM  Result Value Ref Range Status   Specimen Description ENDOTRACHEAL  Final   Special Requests NONE  Final   Sputum evaluation THIS SPECIMEN IS ACCEPTABLE FOR SPUTUM CULTURE  Final   Report Status 01/14/2015 FINAL  Final  Culture, respiratory  (NON-Expectorated)     Status: None (Preliminary result)   Collection Time: 01/14/15 12:45 PM  Result Value Ref Range Status   Specimen Description ENDOTRACHEAL  Final   Special Requests NONE Reflexed from [id number]  Final   Gram Stain   Final    MANY WBC SEEN FEW GRAM NEGATIVE RODS FEW GRAM POSITIVE COCCI IN CLUSTERS IN PAIRS GOOD SPECIMEN - 80-90% WBCS    Culture   Final    MODERATE GROWTH STAPHYLOCOCCUS AUREUS LIGHT GROWTH PROTEUS MIRABILIS SUSCEPTIBILITIES TO FOLLOW ONCE ISOLATED    Report Status PENDING  Incomplete  Blood Culture (routine x 2)     Status: None (Preliminary result)   Collection Time: 01/14/15  1:15 PM  Result Value Ref Range Status   Specimen Description BLOOD LH  Final   Special Requests BOTTLES DRAWN AEROBIC AND ANAEROBIC  Final   Culture  Setup Time   Final    GRAM POSITIVE COCCI IN BOTH AEROBIC AND ANAEROBIC BOTTLES IDENTIFICATION TO FOLLOW CRITICAL RESULT CALLED TO, READ BACK BY AND VERIFIED WITH: PAM MYERS ON 01/15/15 AT 1100AM BY JEF CRITICAL VALUE NOTED.  VALUE IS CONSISTENT WITH PREVIOUSLY REPORTED AND CALLED VALUE.    Culture   Final    GRAM POSITIVE COCCI IN BOTH AEROBIC AND ANAEROBIC BOTTLES IDENTIFICATION TO FOLLOW SUSCEPTIBILITIES TO FOLLOW AEROBIC BOTTLE ONLY HOLDING FOR POSSIBLE ADDITIONAL PATHOGENS ONCE ISOLATED    Report Status PENDING  Incomplete  MRSA PCR Screening     Status: None   Collection Time: 01/14/15  5:25 PM  Result Value  Ref Range Status   MRSA by PCR NEGATIVE NEGATIVE Final    Comment:        The GeneXpert MRSA Assay (FDA approved for NASAL specimens only), is one component of a comprehensive MRSA colonization surveillance program. It is not intended to diagnose MRSA infection nor to guide or monitor treatment for MRSA infections.   Urine culture     Status: None (Preliminary result)   Collection Time: 01/15/15  2:12 AM  Result Value Ref Range Status   Specimen Description URINE, CLEAN CATCH  Final   Special  Requests NONE  Final   Culture NO GROWTH < 12 HOURS  Final   Report Status PENDING  Incomplete  Culture, expectorated sputum-assessment     Status: None   Collection Time: 01/15/15 12:35 PM  Result Value Ref Range Status   Specimen Description TRACHEAL ASPIRATE  Final   Special Requests Normal  Final   Sputum evaluation THIS SPECIMEN IS ACCEPTABLE FOR SPUTUM CULTURE  Final   Report Status 01/15/2015 FINAL  Final    Medical History: Past Medical History  Diagnosis Date  . Brain injury 2002  . Diabetes mellitus without complication   . Seizures   . Asthma     Medications:  Scheduled:  . albuterol  2.5 mg Nebulization Q4H  . antiseptic oral rinse  7 mL Mouth Rinse QID  . baclofen  10 mg Oral BID  . budesonide (PULMICORT) nebulizer solution  0.5 mg Nebulization BID  . chlorhexidine  15 mL Mouth Rinse BID  . feeding supplement (PRO-STAT SUGAR FREE 64)  30 mL Oral TID  . heparin  5,000 Units Subcutaneous 3 times per day  . lactulose  20 Tate Oral BID  . levETIRAcetam  250 mg Per Tube BID  . linezolid  600 mg Intravenous Q12H  . meropenem (MERREM) IV  1 Tate Intravenous 3 times per day  . ranitidine  150 mg Oral BID  . Valproic Acid  500 mg Per Tube BID  . Valproic Acid  750 mg Per Tube QHS   Assessment: CrCl = 140.7 ml/min Pt has listed anaphylaxis to Zosyn,  Incidence of cross sensitivity between PCN's and carbapenems is ~ 1 %.  MD aware of PCN allergy.  Electrolytes within normal range Goal of Therapy:  resolution of infection  Plan:  Expected duration 7 days with resolution of temperature and/or normalization of WBC  1) Merrem: Will continue Merrem 1 Tate q8 hours. 2) Electrolytes: All wnl. Will order electrolyte panel with am labs.    Roger Tate 01/16/2015,10:02 AM

## 2015-01-16 NOTE — Progress Notes (Signed)
Nutrition Follow-up  INTERVENTION:  EN: Recommend titrating to goal rate of 8055mL/hr of Jevity 1.2 TF with Pro-stat TID; RN Pam adjusted to goal rate this am.    NUTRITION DIAGNOSIS:  Inadequate oral intake related to inability to eat, acute illness as evidenced by NPO status; being addressed with TF   GOAL:  Provide needs based on ASPEN/SCCM guidelines; ongoing  MONITOR:   (EN, Energy Intake, Anthropometrics, Digestive System, Electrolyte/Renal profile, Glucose profile)   ASSESSMENT:  Pt remains intubated; SLP following, continuing NPO at this time.  Diet Order: NPO  Current Nutrition: Jevity 1.2 at 5255mL/hr with Prostat TID, free water flushes of 3425mL/hr   Gastrointestinal Profile: Last BM: 7/9 loose stool Gastrointestinal Profile: WDL per Nsg   Medications: lactulose, NS at 19425mL/hr  Electrolyte/Renal Profile and Glucose Profile:   Recent Labs Lab 01/14/15 1228 01/15/15 0637 01/16/15 0808  NA 135 143 139  K 3.6 4.3 3.8  CL 100* 109 108  CO2 23 25 24   BUN 7 <5* <5*  CREATININE 0.44* 0.45* 0.34*  CALCIUM 9.3 9.1 8.9  MG  --   --  1.9  PHOS  --   --  4.1  GLUCOSE 136* 130* 166*   Protein Profile:  Recent Labs Lab 01/14/15 1228  ALBUMIN 3.5     Weight Trend since Admission: Filed Weights   01/14/15 1223 01/15/15 0619 01/16/15 0600  Weight: 175 lb (79.379 kg) 161 lb 6 oz (73.2 kg) 160 lb 4.4 oz (72.7 kg)    BMI:  Body mass index is 26.67 kg/(m^2).  Estimated Nutritional Needs:  Kcal:  1910 kcals (BEE 1654, Ve: 7.7, Tmax: 37.7) using current wt of 73.2 kg  Protein:  110-146 g (1.5-2.0 g/kg)   Fluid:  [id number] mL(30-35 ml/kg)   Skin:  Reviewed, no issues  Diet Order:  Diet NPO time specified  EDUCATION NEEDS:  No education needs identified at this time   HIGH Care Level  Leda QuailAllyson Mynor Witkop, RD, LDN Pager [phone]

## 2015-01-16 NOTE — Progress Notes (Signed)
Follow up Note - Critical Care Medicine  Patient Details:    Azaan Leask is an 24 y.o. male with h/o TBI chronic vent dependence presented with acute respiratory distress. On initial evaluation was noted to have LLL pneumonia. It is felt he may be aspirating and has been seen by SLP to assess.  Lines/tubes : Gastrostomy/Enterostomy PEG-jejunostomy LUQ (Active)  Surrounding Skin Intact 01/16/2015  8:00 AM  Tube Status Clamped 01/16/2015  8:00 AM  Dressing Status Clean;Dry;Intact 01/16/2015  8:00 AM  Dressing Type Split gauze 01/16/2015  8:00 AM  J Port Intake (mL) 50 ml 01/15/2015  6:19 AM    Microbiology/Sepsis markers: Results for orders placed or performed during the hospital encounter of 01/14/15  Blood Culture (routine x 2)     Status: None (Preliminary result)   Collection Time: 01/14/15 12:28 PM  Result Value Ref Range Status   Specimen Description BLOOD  Final   Special Requests NONE  Final   Culture NO GROWTH < 24 HOURS  Final   Report Status PENDING  Incomplete  Culture, expectorated sputum-assessment     Status: None   Collection Time: 01/14/15 12:45 PM  Result Value Ref Range Status   Specimen Description ENDOTRACHEAL  Final   Special Requests NONE  Final   Sputum evaluation THIS SPECIMEN IS ACCEPTABLE FOR SPUTUM CULTURE  Final   Report Status 01/14/2015 FINAL  Final  Culture, respiratory (NON-Expectorated)     Status: None (Preliminary result)   Collection Time: 01/14/15 12:45 PM  Result Value Ref Range Status   Specimen Description ENDOTRACHEAL  Final   Special Requests NONE Reflexed from [id number]  Final   Gram Stain   Final    MANY WBC SEEN FEW GRAM NEGATIVE RODS FEW GRAM POSITIVE COCCI IN CLUSTERS IN PAIRS GOOD SPECIMEN - 80-90% WBCS    Culture   Final    MODERATE GROWTH STAPHYLOCOCCUS AUREUS LIGHT GROWTH PROTEUS MIRABILIS SUSCEPTIBILITIES TO FOLLOW ONCE ISOLATED    Report Status PENDING  Incomplete  Blood Culture (routine x 2)     Status: None (Preliminary  result)   Collection Time: 01/14/15  1:15 PM  Result Value Ref Range Status   Specimen Description BLOOD LH  Final   Special Requests BOTTLES DRAWN AEROBIC AND ANAEROBIC  Final   Culture  Setup Time   Final    GRAM POSITIVE COCCI IN BOTH AEROBIC AND ANAEROBIC BOTTLES IDENTIFICATION TO FOLLOW CRITICAL RESULT CALLED TO, READ BACK BY AND VERIFIED WITH: PAM MYERS ON 01/15/15 AT 1100AM BY JEF CRITICAL VALUE NOTED.  VALUE IS CONSISTENT WITH PREVIOUSLY REPORTED AND CALLED VALUE.    Culture   Final    GRAM POSITIVE COCCI IN BOTH AEROBIC AND ANAEROBIC BOTTLES IDENTIFICATION TO FOLLOW SUSCEPTIBILITIES TO FOLLOW AEROBIC BOTTLE ONLY HOLDING FOR POSSIBLE ADDITIONAL PATHOGENS ONCE ISOLATED    Report Status PENDING  Incomplete  MRSA PCR Screening     Status: None   Collection Time: 01/14/15  5:25 PM  Result Value Ref Range Status   MRSA by PCR NEGATIVE NEGATIVE Final    Comment:        The GeneXpert MRSA Assay (FDA approved for NASAL specimens only), is one component of a comprehensive MRSA colonization surveillance program. It is not intended to diagnose MRSA infection nor to guide or monitor treatment for MRSA infections.   Urine culture     Status: None (Preliminary result)   Collection Time: 01/15/15  2:12 AM  Result Value Ref Range Status   Specimen Description URINE,  CLEAN CATCH  Final   Special Requests NONE  Final   Culture NO GROWTH < 12 HOURS  Final   Report Status PENDING  Incomplete  Culture, expectorated sputum-assessment     Status: None   Collection Time: 01/15/15 12:35 PM  Result Value Ref Range Status   Specimen Description TRACHEAL ASPIRATE  Final   Special Requests Normal  Final   Sputum evaluation THIS SPECIMEN IS ACCEPTABLE FOR SPUTUM CULTURE  Final   Report Status 01/15/2015 FINAL  Final    Anti-infectives:  Anti-infectives    Start     Dose/Rate Route Frequency Ordered Stop   01/14/15 1700  meropenem (MERREM) 1 g in sodium chloride 0.9 % 100 mL IVPB   Status:  Discontinued     1 g 200 mL/hr over 30 Minutes Intravenous 3 times per day 01/14/15 1654 01/14/15 1655   01/14/15 1700  meropenem (MERREM) 1 g in sodium chloride 0.9 % 100 mL IVPB     1 g 200 mL/hr over 30 Minutes Intravenous 3 times per day 01/14/15 1655     01/14/15 1530  meropenem (MERREM) 1 g in sodium chloride 0.9 % 100 mL IVPB  Status:  Discontinued     1 g 200 mL/hr over 30 Minutes Intravenous  Once 01/14/15 1451 01/14/15 1646   01/14/15 1500  linezolid (ZYVOX) IVPB 600 mg     600 mg 300 mL/hr over 60 Minutes Intravenous Every 12 hours 01/14/15 1456     01/14/15 1500  meropenem (MERREM) 500 mg in sodium chloride 0.9 % 50 mL IVPB  Status:  Discontinued     500 mg 100 mL/hr over 30 Minutes Intravenous 3 times per day 01/14/15 1456 01/14/15 1646   01/14/15 1315  ciprofloxacin (CIPRO) IVPB 400 mg    Comments:  Father says Ciprofloxacin is one of the only antibiotics he tolerates   400 mg 200 mL/hr over 60 Minutes Intravenous  Once 01/14/15 1304 01/14/15 1610       Consults:      Studies: Dg Chest 1 View  01/08/2015   CLINICAL DATA:  Blood from tracheostomy.  EXAM: CHEST  1 VIEW  COMPARISON:  None.  FINDINGS: Patient is rotated to the RIGHT. Tracheostomy appears in good position. Increased density is present at the LEFT lung base, some of which is due to overlapping soft tissues and cardiopericardial silhouette. LEFT base airspace disease cannot be excluded. Levoconvex cervicothoracic scoliosis.  IMPRESSION: 1. Tracheostomy appears in good position. 2. LEFT basilar opacity which may represent atelectasis or airspace disease.   Electronically Signed   By: Andreas Newport M.D.   On: 01/08/2015 16:54   Dg Chest Port 1 View  01/14/2015   CLINICAL DATA:  Respiratory distress  EXAM: PORTABLE CHEST - 1 VIEW  COMPARISON:  01/08/2015  FINDINGS: Study is limited by patient rotation. Tracheostomy tube is unchanged in position. Persistent opacity in left lower lobe which may represent  atelectasis or infiltrate with worsening from prior exam. Right lung is clear. No convincing pulmonary edema.  IMPRESSION: Stable tracheostomy tube position. Again noted opacity in left lower lobe which may represent atelectasis or infiltrate with worsening from prior exam. Limited study by patient rotation.   Electronically Signed   By: Natasha Mead M.D.   On: 01/14/2015 13:01     Events:  Subjective:    Overnight Issues:  No acute events noted. Has some increased Sputum still noted on Meropenem Objective:  Vital signs for last 24 hours: Temp:  [98.5  F (36.9 C)-99 F (37.2 C)] 99 F (37.2 C) (07/09 0712) Pulse Rate:  [77-113] 109 (07/09 0900) Resp:  [18-33] 19 (07/09 0900) BP: (91-140)/(47-90) 99/64 mmHg (07/09 0900) SpO2:  [80 %-100 %] 95 % (07/09 0900) FiO2 (%):  [40 %-60 %] 40 % (07/09 0900) Weight:  [72.7 kg (160 lb 4.4 oz)] 72.7 kg (160 lb 4.4 oz) (07/09 0600)  Hemodynamic parameters for last 24 hours:    Intake/Output from previous day: 07/08 0701 - 07/09 0700 In: 2768.8 [I.V.:1500; NG/GT:368.8; IV Piggyback:900] Out: 550 [Urine:550]  Intake/Output this shift: Total I/O In: 300 [I.V.:250; NG/GT:50] Out: -   Vent settings for last 24 hours: Vent Mode:  [-] PRVC FiO2 (%):  [40 %-60 %] 40 % Set Rate:  [10 bmp] 10 bmp Vt Set:  [350 mL] 350 mL PEEP:  [5 cmH20] 5 cmH20  Physical Exam:  Head atraumatic normocephalic Eyes PERRLA no incterus Throat:has trach in place Respiratory CTA bilateral no ronchi rales noted Cardio RRR s1s2 normal no g/r/m GI soft non-tender no rebound Extremities no C/C Neuro unable to assess  Results for orders placed or performed during the hospital encounter of 01/14/15 (from the past 24 hour(s))  Culture, expectorated sputum-assessment     Status: None   Collection Time: 01/15/15 12:35 PM  Result Value Ref Range   Specimen Description TRACHEAL ASPIRATE    Special Requests Normal    Sputum evaluation THIS SPECIMEN IS ACCEPTABLE FOR  SPUTUM CULTURE    Report Status 01/15/2015 FINAL   Glucose, capillary     Status: Abnormal   Collection Time: 01/15/15  1:24 PM  Result Value Ref Range   Glucose-Capillary 166 (H) 65 - 99 mg/dL  Glucose, capillary     Status: Abnormal   Collection Time: 01/16/15  7:28 AM  Result Value Ref Range   Glucose-Capillary 175 (H) 65 - 99 mg/dL  Basic metabolic panel     Status: Abnormal   Collection Time: 01/16/15  8:08 AM  Result Value Ref Range   Sodium 139 135 - 145 mmol/L   Potassium 3.8 3.5 - 5.1 mmol/L   Chloride 108 101 - 111 mmol/L   CO2 24 22 - 32 mmol/L   Glucose, Bld 166 (H) 65 - 99 mg/dL   BUN <5 (L) 6 - 20 mg/dL   Creatinine, Ser 6.28 (L) 0.61 - 1.24 mg/dL   Calcium 8.9 8.9 - 31.5 mg/dL   GFR calc non Af Amer >60 >60 mL/min   GFR calc Af Amer >60 >60 mL/min   Anion gap 7 5 - 15  Magnesium     Status: None   Collection Time: 01/16/15  8:08 AM  Result Value Ref Range   Magnesium 1.9 1.7 - 2.4 mg/dL  Phosphorus     Status: None   Collection Time: 01/16/15  8:08 AM  Result Value Ref Range   Phosphorus 4.1 2.5 - 4.6 mg/dL     Assessment/Plan:   PULMONARY-Acute respiratory failure with hypoxia -Remains on the ventilator -will titrate fio2 as tolerated -titrate PEEP as tolerated -SLP reports possible aspiration still upon evaluation -father insists on taking him home today or tomorrow I have suggested we get a Case Worker to assess if it is safe at this time to consider discharge  CardioVascular HYPOTENSION -HYPOtension Improved -will follow presures  RENAL -No active issues -monitor IOs -monitor labs  ID Acute LLL Pneumonia secondary to Aspiration -will continue with antibiotics as ordered meropenem Zyvox -reported to still be having aspiration -will  follow up on sputum cultures  GI-Aspiration  -will follow up on SLP recs  ENDO-Hyperglycemia -will monitor FSBS -SSI as needed     LOS: 2 days    Critical Care Total Time: 35MIN  Yevonne PaxSaadat A Annely Sliva,  MD Pulmonary Critical Care Medicine Sleep Medicine  I have personally obtained a history, examined the patient, evaluated laboratory and imaging results, formulated the assessment and plan and placed orders.  The Patient requires high complexity decision making for assessment and support, frequent evaluation and titration of therapies, application of advanced monitoring technologies and extensive interpretation of multiple databases.    *Care during the described time interval was provided by me and/or other providers on the critical care team.  I have reviewed this patient's available data, including medical history, events of note, physical examination and test results as part of my evaluation.

## 2015-01-16 NOTE — Care Management Note (Signed)
Case Management Note  Patient Details  Name: Genia HotterJoshua Vohs MRN: [id number] Date of Birth: 1990/07/13  Subjective/Objective:          Discussed discharge planning with Yvonne KendallJoshua Sobecki's father, per Laneta SimmersJushua is intubated. Per father's report, [facility]. Ivin BootyJoshua has a home ventilator at his home and his [facility] day of discharge if Ivin BootyJoshua is not able to be weaned to oxygen to his trach collar only. In either event, Ivin BootyJoshua will be transported home via EMS. Confirmed this discharge plan with ICU RN Pam.           Action/Plan:   Expected Discharge Date:                  Expected Discharge Plan:     In-House Referral:     Discharge planning Services     Post Acute Care Choice:    Choice offered to:     DME Arranged:    DME Agency:     HH Arranged:    HH Agency:     Status of Service:     Medicare Important Message Given:    Date Medicare IM Given:    Medicare IM give by:    Date Additional Medicare IM Given:    Additional Medicare Important Message give by:     If discussed at Long Length of Stay Meetings, dates discussed:    Additional Comments:  Sulaiman Imbert A, RN 01/16/2015, 9:43 AM

## 2015-01-17 LAB — CULTURE, RESPIRATORY W GRAM STAIN

## 2015-01-17 LAB — BASIC METABOLIC PANEL
Anion gap: 5 (ref 5–15)
BUN: <5 mg/dL — ABNORMAL LOW (ref 6–20)
CHLORIDE: 107 mmol/L (ref 101–111)
CO2: 24 mmol/L (ref 22–32)
Calcium: 8.3 mg/dL — ABNORMAL LOW (ref 8.9–10.3)
Creatinine, Ser: 0.32 mg/dL — ABNORMAL LOW (ref 0.61–1.24)
GFR calc Af Amer: >60 mL/min (ref >60)
Glucose, Bld: 217 mg/dL — ABNORMAL HIGH (ref 65–99)
Potassium: 3.4 mmol/L — ABNORMAL LOW (ref 3.5–5.1)
Sodium: 136 mmol/L (ref 135–145)

## 2015-01-17 LAB — CULTURE, RESPIRATORY

## 2015-01-17 LAB — MAGNESIUM: MAGNESIUM: 1.8 mg/dL (ref 1.7–2.4)

## 2015-01-17 LAB — PHOSPHORUS: Phosphorus: 3.3 mg/dL (ref 2.5–4.6)

## 2015-01-17 MED ORDER — POTASSIUM CHLORIDE 10 MEQ/100ML IV SOLN
10.0000 meq | INTRAVENOUS | Status: AC
  Administered 2015-01-17 (×4): 10 meq via INTRAVENOUS
  Filled 2015-01-17 (×4): qty 100

## 2015-01-17 MED ORDER — CEFAZOLIN SODIUM 1-5 GM-% IV SOLN
1.0000 g | Freq: Three times a day (TID) | INTRAVENOUS | Status: DC
  Administered 2015-01-17 – 2015-01-19 (×6): 1 g via INTRAVENOUS
  Filled 2015-01-17 (×10): qty 50

## 2015-01-17 MED ORDER — POTASSIUM CHLORIDE 10 MEQ/100ML IV SOLN
10.0000 meq | INTRAVENOUS | Status: DC
  Filled 2015-01-17 (×2): qty 100

## 2015-01-17 NOTE — Progress Notes (Signed)
PARENTERAL NUTRITION CONSULT NOTE - FOLLOW UP  Pharmacy Consult for Electrolyte Mgmt   Allergies  Allergen Reactions  . Sulfa Antibiotics Anaphylaxis  . Zosyn [Piperacillin-Tazobactam In Dex] Anaphylaxis  . Vancomycin   . Penicillins Rash    Patient Measurements: Height: 5\' 5"  (165.1 cm) Weight: 160 lb 4.4 oz (72.7 kg) IBW/kg (Calculated) : 61.5  Vital Signs: Temp: 98.9 F (37.2 C) (07/10 0000) Temp Source: Axillary (07/10 0400) BP: 129/83 mmHg (07/10 0600) Pulse Rate: 105 (07/10 0600) Intake/Output from previous day: 07/09 0701 - 07/10 0700 In: 2475 [I.V.:1375; NG/GT:300; IV Piggyback:800] Out: -  Intake/Output from this shift: Total I/O In: 425 [NG/GT:25; IV Piggyback:400] Out: -   Labs:  Recent Labs  01/14/15 1228 01/15/15 0637  WBC 11.9* 10.9*  HGB 13.3 12.5*  HCT 39.0* 36.7*  PLT 235 230     Recent Labs  01/14/15 1228 01/15/15 0637 01/16/15 0808 01/17/15 0531  NA 135 143 139 136  K 3.6 4.3 3.8 3.4*  CL 100* 109 108 107  CO2 23 25 24 24   GLUCOSE 136* 130* 166* 217*  BUN 7 <5* <5* <5*  CREATININE 0.44* 0.45* 0.34* 0.32*  CALCIUM 9.3 9.1 8.9 8.3*  MG  --   --  1.9 1.8  PHOS  --   --  4.1 3.3  PROT 8.2*  --   --   --   ALBUMIN 3.5  --   --   --   AST 104*  --   --   --   ALT 108*  --   --   --   ALKPHOS 59  --   --   --   BILITOT 0.8  --   --   --    Estimated Creatinine Clearance: 126 mL/min (by C-G formula based on Cr of 0.32).    Recent Labs  01/15/15 0640 01/15/15 1324 01/16/15 0728  GLUCAP 116* 166* 175*    Medications:  Scheduled:  . albuterol  2.5 mg Nebulization Q4H  . antiseptic oral rinse  7 mL Mouth Rinse QID  . baclofen  10 mg Oral BID  . budesonide (PULMICORT) nebulizer solution  0.5 mg Nebulization BID  . chlorhexidine  15 mL Mouth Rinse BID  . feeding supplement (PRO-STAT SUGAR FREE 64)  30 mL Oral TID  . heparin  5,000 Units Subcutaneous 3 times per day  . lactulose  20 g Oral BID  . levETIRAcetam  250 mg Per  Tube BID  . linezolid  600 mg Intravenous Q12H  . meropenem (MERREM) IV  1 g Intravenous 3 times per day  . potassium chloride  10 mEq Intravenous Q1 Hr x 2  . ranitidine  150 mg Oral BID  . Valproic Acid  500 mg Per Tube BID  . Valproic Acid  750 mg Per Tube QHS   Infusions:  . sodium chloride 125 mL/hr at 01/16/15 1800  . feeding supplement (JEVITY 1.2 CAL) 1,000 mL (01/17/15 0600)  . free water     PRN: acetaminophen **OR** acetaminophen, clonazePAM, ondansetron **OR** ondansetron (ZOFRAN) IV   Assessment: Potassium is slightly low.   Plan:  Will order potassium chloride 10 meq iv x 2 and f/u AM labs.   Roger Tate, Roger Tate 01/17/2015,6:36 AM

## 2015-01-17 NOTE — Progress Notes (Signed)
Follow up Note - Critical Care Medicine  Patient Details:    Roger Tate is an 24 y.o. male with h/o TBI chronic vent dependence presented with acute respiratory distress. On initial evaluation was noted to have LLL pneumonia. It is felt he may be aspirating SLP to reassess today on T-collar  Lines/tubes : Gastrostomy/Enterostomy PEG-jejunostomy LUQ (Active)  Surrounding Skin Reddened;Dry 01/17/2015  8:00 AM  Tube Status Clamped 01/16/2015  8:00 AM  Dressing Status Clean;Dry;Intact 01/17/2015  4:00 AM  Dressing Intervention Dressing changed 01/17/2015  8:00 AM  Dressing Type Split gauze 01/17/2015  8:00 AM  Dressing Change Due 01/18/15 01/17/2015  8:00 AM  Gastric Residual 30 mL 01/16/2015  2:00 PM  J Port Intake (mL) 50 ml 01/15/2015  6:19 AM     External Urinary Catheter (Active)  Collection Container Standard drainage bag 01/17/2015  8:55 AM  Securement Method Leg strap 01/17/2015  8:55 AM  Output (mL) 100 mL 01/16/2015  3:00 AM    Microbiology/Sepsis markers: Results for orders placed or performed during the hospital encounter of 01/14/15  Blood Culture (routine x 2)     Status: None (Preliminary result)   Collection Time: 01/14/15 12:28 PM  Result Value Ref Range Status   Specimen Description BLOOD  Final   Special Requests NONE  Final   Culture NO GROWTH 3 DAYS  Final   Report Status PENDING  Incomplete  Culture, expectorated sputum-assessment     Status: None   Collection Time: 01/14/15 12:45 PM  Result Value Ref Range Status   Specimen Description ENDOTRACHEAL  Final   Special Requests NONE  Final   Sputum evaluation THIS SPECIMEN IS ACCEPTABLE FOR SPUTUM CULTURE  Final   Report Status 01/14/2015 FINAL  Final  Culture, respiratory (NON-Expectorated)     Status: None   Collection Time: 01/14/15 12:45 PM  Result Value Ref Range Status   Specimen Description ENDOTRACHEAL  Final   Special Requests NONE Reflexed from [id number]  Final   Gram Stain   Final    MANY WBC SEEN FEW GRAM  NEGATIVE RODS FEW GRAM POSITIVE COCCI IN CLUSTERS IN PAIRS GOOD SPECIMEN - 80-90% WBCS    Culture   Final    MODERATE GROWTH METHICILLIN RESISTANT STAPHYLOCOCCUS AUREUS LIGHT GROWTH PROTEUS MIRABILIS CRITICAL RESULT CALLED TO, READ BACK BY AND VERIFIED WITH: PAM MYERS AT 1310 01/16/15 CTJ    Report Status 01/17/2015 FINAL  Final   Organism ID, Bacteria METHICILLIN RESISTANT STAPHYLOCOCCUS AUREUS  Final   Organism ID, Bacteria PROTEUS MIRABILIS  Final      Susceptibility   Proteus mirabilis - MIC*    AMPICILLIN >=32 RESISTANT Resistant     CEFAZOLIN <=4 SENSITIVE Sensitive     CEFTRIAXONE <=1 SENSITIVE Sensitive     CIPROFLOXACIN >=4 RESISTANT Resistant     GENTAMICIN <=1 SENSITIVE Sensitive     IMIPENEM 2 SENSITIVE Sensitive     NITROFURANTOIN 128 RESISTANT Resistant     TRIMETH/SULFA >=320 RESISTANT Resistant     CEFTAZIDIME Value in next row Sensitive      SENSITIVE<=1    PIP/TAZO Value in next row Sensitive      SENSITIVE<=4    LEVOFLOXACIN Value in next row Resistant      RESISTANT>=8    ERTAPENEM Value in next row Sensitive      SENSITIVE<=0.5    * LIGHT GROWTH PROTEUS MIRABILIS   Methicillin resistant staphylococcus aureus - MIC*    CIPROFLOXACIN Value in next row Resistant  SENSITIVE<=0.5    GENTAMICIN Value in next row Sensitive      SENSITIVE<=0.5    OXACILLIN Value in next row Resistant      SENSITIVE<=0.5    VANCOMYCIN Value in next row Sensitive      SENSITIVE<=0.5    TRIMETH/SULFA Value in next row Sensitive      SENSITIVE<=0.5    CEFOXITIN SCREEN Value in next row Resistant      POSITIVECEFOXITIN SCREEN - This test may be used to predict mecA-mediated oxacillin resistance, and it is based on the cefoxitin disk screen test.  The cefoxitin screen and oxacillin work in combination to determine the final interpretation reported for oxacillin.     Inducible Clindamycin Value in next row Sensitive      POSITIVECEFOXITIN SCREEN - This test may be used to predict  mecA-mediated oxacillin resistance, and it is based on the cefoxitin disk screen test.  The cefoxitin screen and oxacillin work in combination to determine the final interpretation reported for oxacillin.     ERYTHROMYCIN Value in next row Resistant      RESISTANT>=8    TETRACYCLINE Value in next row Sensitive      SENSITIVE<=1    CLINDAMYCIN Value in next row Resistant      RESISTANT>=8    LEVOFLOXACIN Value in next row Resistant      RESISTANT>=8    LINEZOLID Value in next row Sensitive      SENSITIVE2    * MODERATE GROWTH METHICILLIN RESISTANT STAPHYLOCOCCUS AUREUS  Blood Culture (routine x 2)     Status: None (Preliminary result)   Collection Time: 01/14/15  1:15 PM  Result Value Ref Range Status   Specimen Description BLOOD LH  Final   Special Requests BOTTLES DRAWN AEROBIC AND ANAEROBIC  Final   Culture  Setup Time   Final    GRAM POSITIVE COCCI IN BOTH AEROBIC AND ANAEROBIC BOTTLES IDENTIFICATION TO FOLLOW CRITICAL RESULT CALLED TO, READ BACK BY AND VERIFIED WITH: PAM MYERS ON 01/15/15 AT 1100AM BY JEF CRITICAL VALUE NOTED.  VALUE IS CONSISTENT WITH PREVIOUSLY REPORTED AND CALLED VALUE.    Culture   Final    GRAM POSITIVE COCCI IN BOTH AEROBIC AND ANAEROBIC BOTTLES IDENTIFICATION TO FOLLOW SUSCEPTIBILITIES TO FOLLOW IN BOTH AEROBIC AND ANAEROBIC BOTTLES    Report Status PENDING  Incomplete  MRSA PCR Screening     Status: None   Collection Time: 01/14/15  5:25 PM  Result Value Ref Range Status   MRSA by PCR NEGATIVE NEGATIVE Final    Comment:        The GeneXpert MRSA Assay (FDA approved for NASAL specimens only), is one component of a comprehensive MRSA colonization surveillance program. It is not intended to diagnose MRSA infection nor to guide or monitor treatment for MRSA infections.   Urine culture     Status: None   Collection Time: 01/15/15  2:12 AM  Result Value Ref Range Status   Specimen Description URINE, CLEAN CATCH  Final   Special Requests NONE   Final   Culture NO GROWTH 1 DAY  Final   Report Status 01/16/2015 FINAL  Final  Culture, expectorated sputum-assessment     Status: None   Collection Time: 01/15/15 12:35 PM  Result Value Ref Range Status   Specimen Description TRACHEAL ASPIRATE  Final   Special Requests Normal  Final   Sputum evaluation THIS SPECIMEN IS ACCEPTABLE FOR SPUTUM CULTURE  Final   Report Status 01/15/2015 FINAL  Final  Culture,  respiratory (NON-Expectorated)     Status: None (Preliminary result)   Collection Time: 01/15/15 12:35 PM  Result Value Ref Range Status   Specimen Description TRACHEAL ASPIRATE  Final   Special Requests Normal Reflexed from [id number]  Final   Gram Stain PENDING  Incomplete   Culture   Final    LIGHT GROWTH PROTEUS MIRABILIS LIGHT GROWTH STAPHYLOCOCCUS AUREUS SUSCEPTIBILITIES TO FOLLOW HOLDING FOR ADDITIONAL POSSIBLE PATHOGENS    Report Status PENDING  Incomplete   Organism ID, Bacteria PROTEUS MIRABILIS  Final      Susceptibility   Proteus mirabilis - MIC*    AMPICILLIN >=32 RESISTANT Resistant     CEFAZOLIN 8 SENSITIVE Sensitive     CEFTRIAXONE <=1 SENSITIVE Sensitive     CIPROFLOXACIN >=4 RESISTANT Resistant     GENTAMICIN <=1 SENSITIVE Sensitive     IMIPENEM 2 SENSITIVE Sensitive     NITROFURANTOIN 64 RESISTANT Resistant     TRIMETH/SULFA >=320 RESISTANT Resistant     CEFTAZIDIME Value in next row Sensitive      SENSITIVE<=1    PIP/TAZO Value in next row Sensitive      SENSITIVE<=4    LEVOFLOXACIN Value in next row Resistant      RESISTANT>=8    AMPICILLIN/SULBACTAM Value in next row Sensitive      SENSITIVE<=2    TOBRAMYCIN Value in next row Sensitive      SENSITIVE<=1    * LIGHT GROWTH PROTEUS MIRABILIS  C difficile quick scan w PCR reflex ([facility] only)     Status: None   Collection Time: 01/16/15  6:12 PM  Result Value Ref Range Status   C Diff antigen NEGATIVE  Final   C Diff toxin NEGATIVE  Final   C Diff interpretation Negative for C. difficile  Final     Anti-infectives:  Anti-infectives    Start     Dose/Rate Route Frequency Ordered Stop   01/17/15 1400  ceFAZolin (ANCEF) IVPB 1 g/50 mL premix     1 g 100 mL/hr over 30 Minutes Intravenous 3 times per day 01/17/15 1037     01/14/15 1700  meropenem (MERREM) 1 g in sodium chloride 0.9 % 100 mL IVPB  Status:  Discontinued     1 g 200 mL/hr over 30 Minutes Intravenous 3 times per day 01/14/15 1654 01/14/15 1655   01/14/15 1700  meropenem (MERREM) 1 g in sodium chloride 0.9 % 100 mL IVPB  Status:  Discontinued     1 g 200 mL/hr over 30 Minutes Intravenous 3 times per day 01/14/15 1655 01/17/15 1037   01/14/15 1530  meropenem (MERREM) 1 g in sodium chloride 0.9 % 100 mL IVPB  Status:  Discontinued     1 g 200 mL/hr over 30 Minutes Intravenous  Once 01/14/15 1451 01/14/15 1646   01/14/15 1500  linezolid (ZYVOX) IVPB 600 mg     600 mg 300 mL/hr over 60 Minutes Intravenous Every 12 hours 01/14/15 1456     01/14/15 1500  meropenem (MERREM) 500 mg in sodium chloride 0.9 % 50 mL IVPB  Status:  Discontinued     500 mg 100 mL/hr over 30 Minutes Intravenous 3 times per day 01/14/15 1456 01/14/15 1646   01/14/15 1315  ciprofloxacin (CIPRO) IVPB 400 mg    Comments:  Father says Ciprofloxacin is one of the only antibiotics he tolerates   400 mg 200 mL/hr over 60 Minutes Intravenous  Once 01/14/15 1304 01/14/15 1610       Consults:  Studies: Dg Chest 1 View  01/08/2015   CLINICAL DATA:  Blood from tracheostomy.  EXAM: CHEST  1 VIEW  COMPARISON:  None.  FINDINGS: Patient is rotated to the RIGHT. Tracheostomy appears in good position. Increased density is present at the LEFT lung base, some of which is due to overlapping soft tissues and cardiopericardial silhouette. LEFT base airspace disease cannot be excluded. Levoconvex cervicothoracic scoliosis.  IMPRESSION: 1. Tracheostomy appears in good position. 2. LEFT basilar opacity which may represent atelectasis or airspace disease.    Electronically Signed   By: Andreas Newport M.D.   On: 01/08/2015 16:54   Dg Chest Port 1 View  01/14/2015   CLINICAL DATA:  Respiratory distress  EXAM: PORTABLE CHEST - 1 VIEW  COMPARISON:  01/08/2015  FINDINGS: Study is limited by patient rotation. Tracheostomy tube is unchanged in position. Persistent opacity in left lower lobe which may represent atelectasis or infiltrate with worsening from prior exam. Right lung is clear. No convincing pulmonary edema.  IMPRESSION: Stable tracheostomy tube position. Again noted opacity in left lower lobe which may represent atelectasis or infiltrate with worsening from prior exam. Limited study by patient rotation.   Electronically Signed   By: Natasha Mead M.D.   On: 01/14/2015 13:01     Events:  Subjective:    Overnight Issues:  Weaned down to T-collar presently. Doing better this am awake Objective:  Vital signs for last 24 hours: Temp:  [98.5 F (36.9 C)-98.9 F (37.2 C)] 98.9 F (37.2 C) (07/10 0000) Pulse Rate:  [89-299] 299 (07/10 0900) Resp:  [13-22] 19 (07/10 0900) BP: (114-159)/(68-116) 122/78 mmHg (07/10 0900) SpO2:  [85 %-100 %] 94 % (07/10 0900) FiO2 (%):  [30 %] 30 % (07/10 0900)  Hemodynamic parameters for last 24 hours:    Intake/Output from previous day: 07/09 0701 - 07/10 0700 In: 2475 [I.V.:1375; NG/GT:300; IV Piggyback:800] Out: -   Intake/Output this shift:    Vent settings for last 24 hours: Vent Mode:  [-] PSV;CPAP FiO2 (%):  [30 %] 30 % PEEP:  [5 cmH20] 5 cmH20 Pressure Support:  [5 cmH20] 5 cmH20  Physical Exam:  Head atraumatic normocephalic Eyes PERRLA no incterus Throat: Respiratory CTA bilateral no ronchi rales noted Cardio RRR s1s2 normal no g/r/m GI soft non-tender no rebound Extremities no C/C/C no edema Neuro grossly normal tone normal strength  Results for orders placed or performed during the hospital encounter of 01/14/15 (from the past 24 hour(s))  C difficile quick scan w PCR reflex ([facility]  only)     Status: None   Collection Time: 01/16/15  6:12 PM  Result Value Ref Range   C Diff antigen NEGATIVE    C Diff toxin NEGATIVE    C Diff interpretation Negative for C. difficile   Basic metabolic panel     Status: Abnormal   Collection Time: 01/17/15  5:31 AM  Result Value Ref Range   Sodium 136 135 - 145 mmol/L   Potassium 3.4 (L) 3.5 - 5.1 mmol/L   Chloride 107 101 - 111 mmol/L   CO2 24 22 - 32 mmol/L   Glucose, Bld 217 (H) 65 - 99 mg/dL   BUN <5 (L) 6 - 20 mg/dL   Creatinine, Ser 1.47 (L) 0.61 - 1.24 mg/dL   Calcium 8.3 (L) 8.9 - 10.3 mg/dL   GFR calc non Af Amer >60 >60 mL/min   GFR calc Af Amer >60 >60 mL/min   Anion gap 5 5 - 15  Magnesium     Status: None   Collection Time: 01/17/15  5:31 AM  Result Value Ref Range   Magnesium 1.8 1.7 - 2.4 mg/dL  Phosphorus     Status: None   Collection Time: 01/17/15  5:31 AM  Result Value Ref Range   Phosphorus 3.3 2.5 - 4.6 mg/dL     Assessment/Plan:   PULMONARY-Acute respiratory failure with hypoxia -currently on T-collar and seems to be tolerating well -will titrate fio2 as tolerated -SLP reports possible aspiration will reassess off vent  CardioVascular HYPOTENSION -HYPOtension Improved -will follow presures  RENAL -No active issues -monitor IOs -monitor labs  ID Acute LLL Pneumonia secondary to Aspiration -will continue with antibiotics as ordered meropenem Zyvox -reported to still be having aspiration -will follow up on sputum cultures  GI-Aspiration  -will follow up on SLP recs  ENDO-Hyperglycemia -will monitor FSBS -SSI as needed     LOS: 3 days    Yevonne Pax MD [facility] Pulmonary Critical Care Medicine Sleep Medicine  Critical Care Total Time:30 MIN  I have personally obtained a history, examined the patient, evaluated laboratory and imaging results, formulated the assessment and plan and placed orders.  The Patient requires high complexity decision making for assessment and support,  frequent evaluation and titration of therapies, application of advanced monitoring technologies and extensive interpretation of multiple databases.    *Care during the described time interval was provided by me and/or other providers on the critical care team.  I have reviewed this patient's available data, including medical history, events of note, physical examination and test results as part of my evaluation.

## 2015-01-17 NOTE — Progress Notes (Signed)
[facility]  at [facility]   PATIENT NAME: Roger HotterJoshua Tate    MR#:  [id number]  DATE OF BIRTH:  03-Sep-1990  SUBJECTIVE:  CHIEF COMPLAINT: Presented with respiratory distress, currently on vent, will try to wean him down to trach collar today   REVIEW OF SYSTEMS:  Unobtainable  DRUG ALLERGIES:   Allergies  Allergen Reactions  . Sulfa Antibiotics Anaphylaxis  . Zosyn [Piperacillin-Tazobactam In Dex] Anaphylaxis  . Vancomycin   . Penicillins Rash    VITALS:  Blood pressure 129/83, pulse 105, temperature 98.9 F (37.2 C), temperature source Axillary, resp. rate 15, height 5\' 5"  (1.651 m), weight 72.7 kg (160 lb 4.4 oz), SpO2 96 %.  PHYSICAL EXAMINATION:  GENERAL:  24 y.o.-year-old patient lying in the bed with no acute distress.  EYES: Pupils equal, round, reactive to light and accommodation. No scleral icterus.  HEENT: Head atraumatic, normocephalic.  NECK:  Supple, no jugular venous distention. Trach site is intact LUNGS: Coarse breath sounds bilaterally, no wheezing,  some rales and rhonchi , no  crepitation. No use of accessory muscles of respiration.  CARDIOVASCULAR: S1, S2 normal. No murmurs, rubs, or gallops.  ABDOMEN: Soft, nontender, nondistended. Bowel sounds present. No organomegaly or mass.  EXTREMITIES: No pedal edema, cyanosis, or clubbing.  NEUROLOGIC: History of brain injury  PSYCHIATRIC: The patient is alert  SKIN: No obvious rash, lesion    LABORATORY PANEL:   CBC  Recent Labs Lab 01/15/15 0637  WBC 10.9*  HGB 12.5*  HCT 36.7*  PLT 230   ------------------------------------------------------------------------------------------------------------------  Chemistries   Recent Labs Lab 01/14/15 1228  01/17/15 0531  NA 135  < > 136  K 3.6  < > 3.4*  CL 100*  < > 107  CO2 23  < > 24  GLUCOSE 136*  < > 217*  BUN 7  < > <5*  CREATININE 0.44*  < > 0.32*  CALCIUM 9.3  < > 8.3*  MG  --   < > 1.8  AST 104*  --    --   ALT 108*  --   --   ALKPHOS 59  --   --   BILITOT 0.8  --   --   < > = values in this interval not displayed. ------------------------------------------------------------------------------------------------------------------  Cardiac Enzymes No results for input(s): TROPONINI in the last 168 hours. ------------------------------------------------------------------------------------------------------------------  RADIOLOGY:  No results found.  EKG:   Orders placed or performed during the hospital encounter of 01/14/15  . EKG 12-Lead  . EKG 12-Lead    ASSESSMENT AND PLAN:   1. Sepsis with gram-positive cocci bacteremia and sputum cultures with Proteus,  on broad-spectrum antibiotic therapy with meropenem and Zyvox.Pending sensitivity results , we will follow up on the culture results  Continue current treatment 2. Acute on chronic respiratory failure with hypoxia with a concern for aspiration. Continue mechanical ventilation. Follow-up with pulmonary consultation for further recommendations. Continue oxygen therapy, titrate to wean off  3. Left lower lobe pneumonia probably from aspiration. Continue antibiotic therapy with meropenem and Zyvox as above SLP reports possible aspiration still upon evaluationOn PEG feeds Jevity ,  4. Hypotension, questionable septic shock, better  ,continue IV fluids, keeping MAP at above 65 5. Leukocytosis. Continue to follow with therapy 6. Hyperglycemia-probably stress-induced-sliding scale 7. Nutrition  via tube feeds-follow up with speech pathology.  father insists on taking him home tomorrow  -Case Worker to assess if it is safe  to consider discharge  Plan of care discussed  with the patient's father, RN and pulmonology All the records are reviewed and case discussed with Care Management/Social Workerr. Management plans discussed with the patient, family and they are in agreement.  CODE STATUS: full  TOTAL critical care  TIME TAKING CARE  OF THIS PATIENT: 35 minutes.   POSSIBLE D/C IN 1-2  DAYS, DEPENDING ON CLINICAL CONDITION.   Ramonita Lab M.D on 01/17/2015 at 9:01 AM  Between 7am to 6pm - Pager - [phone] After 6pm go to [email or web address] - password [email or web address] [facility]  [facility] [place] [facility]  Office  [phone]  CC: Primary care physician; Dorothey Baseman, MD

## 2015-01-18 ENCOUNTER — Inpatient Hospital Stay: Payer: Medicaid Other

## 2015-01-18 DIAGNOSIS — R561 Post traumatic seizures: Secondary | ICD-10-CM

## 2015-01-18 LAB — BASIC METABOLIC PANEL
ANION GAP: 5 (ref 5–15)
BUN: <5 mg/dL — ABNORMAL LOW (ref 6–20)
CO2: 25 mmol/L (ref 22–32)
Calcium: 8.6 mg/dL — ABNORMAL LOW (ref 8.9–10.3)
Chloride: 107 mmol/L (ref 101–111)
Creatinine, Ser: 0.32 mg/dL — ABNORMAL LOW (ref 0.61–1.24)
GFR calc non Af Amer: >60 mL/min (ref >60)
Glucose, Bld: 213 mg/dL — ABNORMAL HIGH (ref 65–99)
Potassium: 4.2 mmol/L (ref 3.5–5.1)
Sodium: 137 mmol/L (ref 135–145)

## 2015-01-18 LAB — CBC
HEMATOCRIT: 34.5 % — AB (ref 40.0–52.0)
Hemoglobin: 11.7 g/dL — ABNORMAL LOW (ref 13.0–18.0)
MCH: 31.8 pg (ref 26.0–34.0)
MCHC: 34 g/dL (ref 32.0–36.0)
MCV: 93.6 fL (ref 80.0–100.0)
Platelets: 255 10*3/uL (ref 150–440)
RBC: 3.69 MIL/uL — ABNORMAL LOW (ref 4.40–5.90)
RDW: 13.2 % (ref 11.5–14.5)
WBC: 6.8 10*3/uL (ref 3.8–10.6)

## 2015-01-18 LAB — BLOOD GAS, ARTERIAL
Acid-Base Excess: 1.2 mmol/L (ref 0.0–3.0)
Allens test (pass/fail): POSITIVE — AB
Bicarbonate: 26.6 mEq/L (ref 21.0–28.0)
FIO2: 0.3 %
O2 SAT: 92.2 %
PEEP: 5 cmH2O
PO2 ART: 65 mmHg — AB (ref 83.0–108.0)
PRESSURE SUPPORT: 5 cmH2O
Patient temperature: 37
pCO2 arterial: 44 mmHg (ref 32.0–48.0)
pH, Arterial: 7.39 (ref 7.350–7.450)

## 2015-01-18 LAB — PHOSPHORUS: Phosphorus: 3 mg/dL (ref 2.5–4.6)

## 2015-01-18 LAB — GLUCOSE, CAPILLARY: GLUCOSE-CAPILLARY: 210 mg/dL — AB (ref 65–99)

## 2015-01-18 LAB — VALPROIC ACID LEVEL: Valproic Acid Lvl: 24 ug/mL — ABNORMAL LOW (ref 50.0–100.0)

## 2015-01-18 LAB — MAGNESIUM: MAGNESIUM: 1.7 mg/dL (ref 1.7–2.4)

## 2015-01-18 MED ORDER — SODIUM CHLORIDE 0.9 % IV SOLN
500.0000 mg | Freq: Two times a day (BID) | INTRAVENOUS | Status: DC
  Administered 2015-01-18 – 2015-01-19 (×3): 500 mg via INTRAVENOUS
  Filled 2015-01-18 (×6): qty 5

## 2015-01-18 MED ORDER — VALPROATE SODIUM 500 MG/5ML IV SOLN
500.0000 mg | Freq: Once | INTRAVENOUS | Status: AC
Start: 2015-01-18 — End: 2015-01-18
  Administered 2015-01-18: 500 mg via INTRAVENOUS
  Filled 2015-01-18: qty 5

## 2015-01-18 NOTE — Progress Notes (Signed)
Dr. Hilton SinclairWeiting called and notified that patient is febrile, tachycardic, and lethargic. Mother at bedside and reports that patient had a seizure last night around 05:30.  Orders received for IV keppra, chest x-ray, and ABG.  Per Dr. Hilton SinclairWeiting leave patient on vent until seen by pulmonary. Nurse will continue to monitor.

## 2015-01-18 NOTE — Progress Notes (Signed)
Dad requesting that patient be placed on trach collar for one hour.  Dr. Meredeth IdeFleming called and request made on behalf of Dad.  Per Dr. Meredeth IdeFleming patient can be placed on trach collar for 1 hour if he tolerates it.  Erica from respiratory called and was asked to place patient on trach collar.  Per nurse and respiratory therapist plan patient to simultaneously have chest PT and breathing treatment. After treatment and chest PT patient will be suctioned and then placed on trach collar as per Dad's request.

## 2015-01-18 NOTE — Consult Note (Signed)
Chief Complaint: seizures  HPI: Roger Tate is an 24 y.o. 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  Pt has chronic history of seizures. As per father pt does have history of seizures which occur on a weekly basis.     Past Medical History  Diagnosis Date  . Brain injury 2002  . Diabetes mellitus without complication   . Seizures   . Asthma     Past Surgical History  Procedure Laterality Date  . Brain surgery  2002  . Tracheostomy tube placement    . Sp perc place gastric tube    . Csf shunt      History reviewed. No pertinent family history. Social History:  reports that he has never smoked. He does not have any smokeless tobacco history on file. He reports that he does not drink alcohol. His drug history is not on file.  Allergies:  Allergies  Allergen Reactions  . Sulfa Antibiotics Anaphylaxis  . Zosyn [Piperacillin-Tazobactam In Dex] Anaphylaxis  . Vancomycin   . Penicillins Rash    Medications: I have reviewed the patient's current medications.  ROS: Unable to obtain   Physical Examination: Blood pressure 116/77, pulse 92, temperature 98.5 F (36.9 C), temperature source Axillary, resp. rate 18, height 5\' 5"  (1.651 m), weight 71.2 kg (156 lb 15.5 oz), SpO2 93 %.  Opens eyes to voice, does not follow commands Responds to visual threats  Withdraws from painful stimuli.    Laboratory Studies:  Basic Metabolic Panel:  Recent Labs Lab 01/14/15 1228 01/15/15 0637 01/16/15 0808 01/17/15 0531 01/18/15 0454  NA 135 143 139 136 137  K 3.6 4.3 3.8 3.4* 4.2  CL 100* 109 108 107 107  CO2 23 25 24 24 25   GLUCOSE 136* 130* 166* 217* 213*  BUN 7 <5* <5* <5* <5*  CREATININE 0.44* 0.45* 0.34* 0.32* 0.32*  CALCIUM 9.3 9.1 8.9 8.3* 8.6*  MG  --   --  1.9 1.8 1.7  PHOS  --   --  4.1 3.3 3.0    Liver Function Tests:  Recent Labs Lab 01/14/15 1228  AST 104*  ALT 108*   ALKPHOS 59  BILITOT 0.8  PROT 8.2*  ALBUMIN 3.5   No results for input(s): LIPASE, AMYLASE in the last 168 hours. No results for input(s): AMMONIA in the last 168 hours.  CBC:  Recent Labs Lab 01/14/15 1228 01/15/15 0637 01/18/15 0454  WBC 11.9* 10.9* 6.8  NEUTROABS 8.7*  --   --   HGB 13.3 12.5* 11.7*  HCT 39.0* 36.7* 34.5*  MCV 92.1 93.4 93.6  PLT 235 230 255    Cardiac Enzymes: No results for input(s): CKTOTAL, CKMB, CKMBINDEX, TROPONINI in the last 168 hours.  BNP: Invalid input(s): POCBNP  CBG:  Recent Labs Lab 01/14/15 1839 01/15/15 0003 01/15/15 0640 01/15/15 1324 01/16/15 0728  GLUCAP 122* 131* 116* 166* 175*    Microbiology: Results for orders placed or performed during the hospital encounter of 01/14/15  Blood Culture (routine x 2)     Status: None (Preliminary result)   Collection Time: 01/14/15 12:28 PM  Result Value Ref Range Status   Specimen Description BLOOD  Final   Special Requests NONE  Final   Culture NO GROWTH 4 DAYS  Final   Report Status PENDING  Incomplete  Culture, expectorated sputum-assessment     Status: None   Collection Time: 01/14/15 12:45 PM  Result Value Ref Range Status   Specimen Description ENDOTRACHEAL  Final   Special Requests NONE  Final   Sputum evaluation THIS SPECIMEN IS ACCEPTABLE FOR SPUTUM CULTURE  Final   Report Status 01/14/2015 FINAL  Final  Culture, respiratory (NON-Expectorated)     Status: None   Collection Time: 01/14/15 12:45 PM  Result Value Ref Range Status   Specimen Description ENDOTRACHEAL  Final   Special Requests NONE Reflexed from [id number]  Final   Gram Stain   Final    MANY WBC SEEN FEW GRAM NEGATIVE RODS FEW GRAM POSITIVE COCCI IN CLUSTERS IN PAIRS GOOD SPECIMEN - 80-90% WBCS    Culture   Final    MODERATE GROWTH METHICILLIN RESISTANT STAPHYLOCOCCUS AUREUS LIGHT GROWTH PROTEUS MIRABILIS CRITICAL RESULT CALLED TO, READ BACK BY AND VERIFIED WITH: PAM MYERS AT 1310 01/16/15 CTJ    Report  Status 01/17/2015 FINAL  Final   Organism ID, Bacteria METHICILLIN RESISTANT STAPHYLOCOCCUS AUREUS  Final   Organism ID, Bacteria PROTEUS MIRABILIS  Final      Susceptibility   Proteus mirabilis - MIC*    AMPICILLIN >=32 RESISTANT Resistant     CEFAZOLIN <=4 SENSITIVE Sensitive     CEFTRIAXONE <=1 SENSITIVE Sensitive     CIPROFLOXACIN >=4 RESISTANT Resistant     GENTAMICIN <=1 SENSITIVE Sensitive     IMIPENEM 2 SENSITIVE Sensitive     NITROFURANTOIN 128 RESISTANT Resistant     TRIMETH/SULFA >=320 RESISTANT Resistant     CEFTAZIDIME Value in next row Sensitive      SENSITIVE<=1    PIP/TAZO Value in next row Sensitive      SENSITIVE<=4    LEVOFLOXACIN Value in next row Resistant      RESISTANT>=8    ERTAPENEM Value in next row Sensitive      SENSITIVE<=0.5    * LIGHT GROWTH PROTEUS MIRABILIS   Methicillin resistant staphylococcus aureus - MIC*    CIPROFLOXACIN Value in next row Resistant      SENSITIVE<=0.5    GENTAMICIN Value in next row Sensitive      SENSITIVE<=0.5    OXACILLIN Value in next row Resistant      SENSITIVE<=0.5    VANCOMYCIN Value in next row Sensitive      SENSITIVE<=0.5    TRIMETH/SULFA Value in next row Sensitive      SENSITIVE<=0.5    CEFOXITIN SCREEN Value in next row Resistant      POSITIVECEFOXITIN SCREEN - This test may be used to predict mecA-mediated oxacillin resistance, and it is based on the cefoxitin disk screen test.  The cefoxitin screen and oxacillin work in combination to determine the final interpretation reported for oxacillin.     Inducible Clindamycin Value in next row Sensitive      POSITIVECEFOXITIN SCREEN - This test may be used to predict mecA-mediated oxacillin resistance, and it is based on the cefoxitin disk screen test.  The cefoxitin screen and oxacillin work in combination to determine the final interpretation reported for oxacillin.     ERYTHROMYCIN Value in next row Resistant      RESISTANT>=8    TETRACYCLINE Value in next row  Sensitive      SENSITIVE<=1    CLINDAMYCIN Value in next row Resistant      RESISTANT>=8    LEVOFLOXACIN Value in next row Resistant      RESISTANT>=8    LINEZOLID Value in next row Sensitive      SENSITIVE2    * MODERATE GROWTH METHICILLIN RESISTANT STAPHYLOCOCCUS  AUREUS  Blood Culture (routine x 2)     Status: None (Preliminary result)   Collection Time: 01/14/15  1:15 PM  Result Value Ref Range Status   Specimen Description BLOOD LH  Final   Special Requests BOTTLES DRAWN AEROBIC AND ANAEROBIC  Final   Culture  Setup Time   Final    GRAM POSITIVE COCCI IN BOTH AEROBIC AND ANAEROBIC BOTTLES IDENTIFICATION TO FOLLOW CRITICAL RESULT CALLED TO, READ BACK BY AND VERIFIED WITH: PAM MYERS ON 01/15/15 AT 1100AM BY JEF CRITICAL VALUE NOTED.  VALUE IS CONSISTENT WITH PREVIOUSLY REPORTED AND CALLED VALUE.    Culture   Final    STAPHYLOCOCCUS CAPITIS IN BOTH AEROBIC AND ANAEROBIC BOTTLES COAGULASE NEGATIVE STAPHYLOCOCCUS ANAEROBIC BOTTLE ONLY POSSIBLE CONTAMINATION WITH SKIN FLORA    Report Status PENDING  Incomplete   Organism ID, Bacteria STAPHYLOCOCCUS CAPITIS  Final      Susceptibility   Staphylococcus capitis - MIC*    CIPROFLOXACIN 2 INTERMEDIATE Intermediate     ERYTHROMYCIN <=0.25 SENSITIVE Sensitive     GENTAMICIN <=0.5 SENSITIVE Sensitive     OXACILLIN Value in next row Sensitive      <=0.25 SENSITIVEWARNING: For oxacillin-resistant S.aureus and coagulase-negative staphylococci (MRS), other beta-lactam agents, ie, penicillins, beta-lactam/beta-lactamase inhibitor combinations, cephems (with the exception of the cephalosporins with anti-MRSA activity), and carbapenems, may appear active in vitro, but are not effective clinically.  --CLSI, Vol.32 No.3, January 2012, pg 70.    TETRACYCLINE Value in next row Sensitive      <=0.25 SENSITIVEWARNING: For oxacillin-resistant S.aureus and coagulase-negative staphylococci (MRS), other beta-lactam agents, ie, penicillins,  beta-lactam/beta-lactamase inhibitor combinations, cephems (with the exception of the cephalosporins with anti-MRSA activity), and carbapenems, may appear active in vitro, but are not effective clinically.  --CLSI, Vol.32 No.3, January 2012, pg 70.    CLINDAMYCIN Value in next row Sensitive      <=0.25 SENSITIVEWARNING: For oxacillin-resistant S.aureus and coagulase-negative staphylococci (MRS), other beta-lactam agents, ie, penicillins, beta-lactam/beta-lactamase inhibitor combinations, cephems (with the exception of the cephalosporins with anti-MRSA activity), and carbapenems, may appear active in vitro, but are not effective clinically.  --CLSI, Vol.32 No.3, January 2012, pg 70.    LEVOFLOXACIN Value in next row Intermediate      INTERMEDIATE4    * STAPHYLOCOCCUS CAPITIS  MRSA PCR Screening     Status: None   Collection Time: 01/14/15  5:25 PM  Result Value Ref Range Status   MRSA by PCR NEGATIVE NEGATIVE Final    Comment:        The GeneXpert MRSA Assay (FDA approved for NASAL specimens only), is one component of a comprehensive MRSA colonization surveillance program. It is not intended to diagnose MRSA infection nor to guide or monitor treatment for MRSA infections.   Urine culture     Status: None   Collection Time: 01/15/15  2:12 AM  Result Value Ref Range Status   Specimen Description URINE, CLEAN CATCH  Final   Special Requests NONE  Final   Culture NO GROWTH 1 DAY  Final   Report Status 01/16/2015 FINAL  Final  Culture, expectorated sputum-assessment     Status: None   Collection Time: 01/15/15 12:35 PM  Result Value Ref Range Status   Specimen Description TRACHEAL ASPIRATE  Final   Special Requests Normal  Final   Sputum evaluation THIS SPECIMEN IS ACCEPTABLE FOR SPUTUM CULTURE  Final   Report Status 01/15/2015 FINAL  Final  Culture, respiratory (NON-Expectorated)     Status: None (Preliminary result)  Collection Time: 01/15/15 12:35 PM  Result Value Ref Range Status    Specimen Description TRACHEAL ASPIRATE  Final   Special Requests Normal Reflexed from [id number]  Final   Gram Stain   Final    MODERATE WBC SEEN FEW GRAM POSITIVE COCCI GOOD SPECIMEN - 80-90% WBCS    Culture   Final    LIGHT GROWTH PROTEUS MIRABILIS LIGHT GROWTH METHICILLIN RESISTANT STAPHYLOCOCCUS AUREUS CRITICAL RESULT CALLED TO, READ BACK BY AND VERIFIED WITH: RACHAEL VERDI AT 1300 01/18/15 DV RULING OUT POSSIBLE SECOND GRAM NEGATIVE ROD    Report Status PENDING  Incomplete   Organism ID, Bacteria PROTEUS MIRABILIS  Final   Organism ID, Bacteria METHICILLIN RESISTANT STAPHYLOCOCCUS AUREUS  Final      Susceptibility   Proteus mirabilis - MIC*    AMPICILLIN >=32 RESISTANT Resistant     CEFAZOLIN 8 SENSITIVE Sensitive     CEFTRIAXONE <=1 SENSITIVE Sensitive     CIPROFLOXACIN >=4 RESISTANT Resistant     GENTAMICIN <=1 SENSITIVE Sensitive     IMIPENEM 2 SENSITIVE Sensitive     NITROFURANTOIN 64 RESISTANT Resistant     TRIMETH/SULFA >=320 RESISTANT Resistant     CEFTAZIDIME Value in next row Sensitive      SENSITIVE<=1    PIP/TAZO Value in next row Sensitive      SENSITIVE<=4    LEVOFLOXACIN Value in next row Resistant      RESISTANT>=8    AMPICILLIN/SULBACTAM Value in next row Sensitive      SENSITIVE<=2    TOBRAMYCIN Value in next row Sensitive      SENSITIVE<=1    * LIGHT GROWTH PROTEUS MIRABILIS   Methicillin resistant staphylococcus aureus - MIC*    CIPROFLOXACIN Value in next row Resistant      SENSITIVE<=1    GENTAMICIN Value in next row Sensitive      SENSITIVE<=1    OXACILLIN Value in next row Resistant      SENSITIVE<=1    VANCOMYCIN Value in next row Sensitive      SENSITIVE<=1    TRIMETH/SULFA Value in next row Sensitive      SENSITIVE<=1    CEFOXITIN SCREEN Value in next row Resistant      POSITIVECEFOXITIN SCREEN - This test may be used to predict mecA-mediated oxacillin resistance, and it is based on the cefoxitin disk screen test.  The cefoxitin screen and  oxacillin work in combination to determine the final interpretation reported for oxacillin.     Inducible Clindamycin Value in next row Sensitive      POSITIVECEFOXITIN SCREEN - This test may be used to predict mecA-mediated oxacillin resistance, and it is based on the cefoxitin disk screen test.  The cefoxitin screen and oxacillin work in combination to determine the final interpretation reported for oxacillin.     TETRACYCLINE Value in next row Sensitive      SENSITIVE<=1    LEVOFLOXACIN Value in next row Resistant      RESISTANT>=8    CLINDAMYCIN Value in next row Resistant      RESISTANT>=8    * LIGHT GROWTH METHICILLIN RESISTANT STAPHYLOCOCCUS AUREUS  C difficile quick scan w PCR reflex ([facility] only)     Status: None   Collection Time: 01/16/15  6:12 PM  Result Value Ref Range Status   C Diff antigen NEGATIVE  Final   C Diff toxin NEGATIVE  Final   C Diff interpretation Negative for C. difficile  Final    Coagulation Studies: No results for input(s): LABPROT,  INR in the last 72 hours.  Urinalysis:  Recent Labs Lab 01/15/15 0212  COLORURINE STRAW*  LABSPEC 1.005  PHURINE 7.0  GLUCOSEU NEGATIVE  HGBUR NEGATIVE  BILIRUBINUR NEGATIVE  KETONESUR NEGATIVE  PROTEINUR NEGATIVE  NITRITE NEGATIVE  LEUKOCYTESUR NEGATIVE    Lipid Panel: No results found for: CHOL, TRIG, HDL, CHOLHDL, VLDL, LDLCALC  HgbA1C:  Lab Results  Component Value Date   HGBA1C 5.6 01/14/2015    Urine Drug Screen:  No results found for: LABOPIA, COCAINSCRNUR, LABBENZ, AMPHETMU, THCU, LABBARB  Alcohol Level: No results for input(s): ETH in the last 168 hours.    Imaging: Dg Chest Port 1 View  01/18/2015   CLINICAL DATA:  Hypoxia.  Pneumonia.  EXAM: PORTABLE CHEST - 1 VIEW  COMPARISON:  01/14/2015.  FINDINGS: Tracheostomy tube noted in good anatomic position. Cardiomegaly. Pulmonary vascularity normal. Mild right lower lobe infiltrate. Right pleural effusion. Interim near complete clearing of left lower  lobe atelectasis and consolidation. No pneumothorax.  IMPRESSION: 1. Interim near complete clearing of left lower lobe atelectasis and/or consolidation. 2. Mild right lower lobe infiltrate and right pleural effusion .   Electronically Signed   By: Maisie Fushomas  Register   On: 01/18/2015 09:07    Assessment: 24 y.o. 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  Pt has chronic history of seizures. As per father pt does have history of seizures which occur on a weekly basis.  Pt is on Keppra 500 q12 VPA 3 times a day VPA level is 24.  Will give another load of 500 IV now As per father frequents seizures despite anti epileptics so will not change doses at this point Keppra can be placed back PO/Peg tube as it is 1:1 PO vs IV  Roger Tate     01/18/2015, 1:52 PM

## 2015-01-18 NOTE — Progress Notes (Signed)
Date: 01/18/2015,   MRN# [id number] Roger Tate 03-Sep-1990 Code Status:     Code Status Orders        Start     Ordered   01/14/15 1708  Full code   Continuous     01/14/15 1708     Hosp day:@LENGTHOFSTAYDAYS @ Referring MD: @     PCP:      HPI: X-COVERING kASSA. Had a fever, ? Seizure last pm. Afebrile now, LLL infiltrate looks better.  PMHX:   Past Medical History  Diagnosis Date  . Brain injury 2002  . Diabetes mellitus without complication   . Seizures   . Asthma    Surgical Hx:  Past Surgical History  Procedure Laterality Date  . Brain surgery  2002  . Tracheostomy tube placement    . Sp perc place gastric tube    . Csf shunt     Family Hx:  History reviewed. No pertinent family history. Social Hx:   History  Substance Use Topics  . Smoking status: Never Smoker   . Smokeless tobacco: Not on file  . Alcohol Use: No   Medication:    Home Medication:  No current outpatient prescriptions on file.  Current Medication: @   Allergies:  Sulfa antibiotics; Zosyn; Vancomycin; and Penicillins  Review of Systems: Gen:  Denies  fever, sweats, chills HEENT: Denies blurred vision, double vision, ear pain, eye pain, hearing loss, nose bleeds, sore throat Cvc:  No dizziness, chest pain or heaviness Resp:    Gi: Denies swallowing difficulty, stomach pain, nausea or vomiting, diarrhea, constipation, bowel incontinence Gu:  Denies bladder incontinence, burning urine Ext:   No Joint pain, stiffness or swelling Skin: No skin rash, easy bruising or bleeding or hives Endoc:  No polyuria, polydipsia , polyphagia or weight change Psych: No depression, insomnia or hallucinations  Other:  All other systems negative  Physical Examination:   VS: BP 118/85 mmHg  Pulse 88  Temp(Src) 98.7 F (37.1 C) (Axillary)  Resp 12  Ht  (1.651 m)  Wt 158 lb 11.7 oz (72 kg)  BMI 26.41 kg/m2  SpO2 93%  General Appearance: No distress  Neuro: without focal  findings, mental status, speech normal, alert and oriented, cranial nerves 2-12 intact, reflexes normal and symmetric, sensation grossly normal  HEENT: PERRLA, EOM intact, no ptosis, no other lesions noticed, Mallampati: Pulmonary:.No wheezing, No rales  Sputum Production:   Cardiovascular:  Normal S1,S2.  No m/r/g.  Abdominal aorta pulsation normal.    Abdomen:Benign, Soft, non-tender, No masses, hepatosplenomegaly, No lymphadenopathy Endoc: No evident thyromegaly, no signs of acromegaly or Cushing features Skin:   warm, no rashes, no ecchymosis  Extremities: normal, no cyanosis, clubbing, no edema, warm with normal capillary refill. Other findings:   Labs results:   Recent Labs     01/16/15  0808  01/17/15  0531  01/18/15  0454  HGB   --    --   11.7*  HCT   --    --   34.5*  MCV   --    --   93.6  WBC   --    --   6.8  BUN  <5*  <5*  <5*  CREATININE  0.34*  0.32*  0.32*  GLUCOSE  166*  217*  213*  CALCIUM  8.9  8.3*  8.6*  ,    No results for input(s): PH in the last 72 hours.  Invalid input(s): PCO2, PO2, BASEEXCESS, BASEDEFICITE, TFT  Culture results:  Rad results:   Dg Chest Port 1 View  01/18/2015   CLINICAL DATA:  Hypoxia.  Pneumonia.  EXAM: PORTABLE CHEST - 1 VIEW  COMPARISON:  01/14/2015.  FINDINGS: Tracheostomy tube noted in good anatomic position. Cardiomegaly. Pulmonary vascularity normal. Mild right lower lobe infiltrate. Right pleural effusion. Interim near complete clearing of left lower lobe atelectasis and consolidation. No pneumothorax.  IMPRESSION: 1. Interim near complete clearing of left lower lobe atelectasis and/or consolidation. 2. Mild right lower lobe infiltrate and right pleural effusion .   Electronically Signed   By: Maisie Fushomas  Register   On: 01/18/2015 09:07      Assessment and Plan: PULMONARY-Acute respiratory failure with hypoxia -t-piece trials, moving towards his home regimen -will titrate fio2 as tolerated -SLP reports possible  aspiration will reassess off vent   ID Acute LLL Pneumonia secondary to Aspiration, CXR LOOKING BETTER -will continue with antibiotics as ordered meropenem Zyvox  CardioVascular HYPOTENSION, resolved -will follow presures  RENAL -No active issues -monitor IOs -monitor labs  GI-Aspiration  -will follow up on SLP recs   I have personally obtained a history, examined the patient, evaluated laboratory and imaging results, formulated the assessment and plan and placed orders.  The Patient requires high complexity decision making for assessment and support, frequent evaluation and titration of therapies, application of advanced monitoring technologies and extensive interpretation of multiple databases.   Herbon Fleming,M.D. Pulmonary & Critical care Medicine [facility]

## 2015-01-18 NOTE — Progress Notes (Signed)
Nutrition Follow-up   INTERVENTION:   EN: recommend continuing Jevity 1.2 TF at goal rate of 55 ml/hr with Prostat TID, free water flush of 25 ml/hr  NUTRITION DIAGNOSIS:  Inadequate oral intake related to inability to eat, acute illness as evidenced by NPO status. Being addressed via TF  GOAL:  Provide needs based on ASPEN/SCCM guidelines  MONITOR:   (EN, Energy Intake, Anthropometrics, Digestive System, Electrolyte/Renal profile, Glucose profile)  ASSESSMENT:  Pt febrile, tachycardic and lethargic this AM; on vent support via trach, trach collar trials as tolerated  EN: tolerating Jevity 1.2 TF at rate of 55 ml/hr, Prostat TID, free water 25 ml/hr; TF off this AM per father request, plan to restart Diet Order: NPO  Digestive System: +2-3 BMs per day, 30 mL residuals, no sign of TF intolerance  Electrolyte/Renal and Glucose Profile:  Recent Labs Lab 01/16/15 0808 01/17/15 0531 01/18/15 0454  NA 139 136 137  K 3.8 3.4* 4.2  CL 108 107 107  CO2 24 24 25   BUN <5* <5* <5*  CREATININE 0.34* 0.32* 0.32*  CALCIUM 8.9 8.3* 8.6*  MG 1.9 1.8 1.7  PHOS 4.1 3.3 3.0  GLUCOSE 166* 217* 213*   Meds: lactulose, NS at 125 ml/hr  Height:  Ht Readings from Last 1 Encounters:  01/14/15 5\' 5"  (1.651 m)    Weight:  Wt Readings from Last 1 Encounters:  01/17/15 156 lb 15.5 oz (71.2 kg)    Ideal Body Weight:     Wt Readings from Last 10 Encounters:  01/17/15 156 lb 15.5 oz (71.2 kg)  01/08/15 175 lb (79.379 kg)    BMI:  Body mass index is 26.12 kg/(m^2).  Estimated Nutritional Needs:  Kcal:  1910 kcals (BEE 1654, Ve: 7.7, Tmax: 37.7) using current wt of 73.2 kg  Protein:  110-146 g (1.5-2.0 g/kg)   Fluid:  2190-2555 mL(30-35 ml/kg)   Skin:  Reviewed, no issues  Diet Order:  Diet NPO time specified  EDUCATION NEEDS:  No education needs identified at this time   Intake/Output Summary (Last 24 hours) at 01/18/15 1057 Last data filed at 01/18/15 1000  Gross  per 24 hour  Intake   3805 ml  Output   4100 ml  Net   -295 ml    MODERATE Care Level  Romelle Starcherate Barnes Florek MS, RD, LDN [phone] Pager

## 2015-01-18 NOTE — Progress Notes (Addendum)
Patient Dad in to visit patient and stated "I want to hear him talk" Dad preceded to deflate cuff on patient trach while patientt on vent.  Nurse explained to Dad the risk of deflating cuff and that nurse and respiratory therapist are responsible for maintaining trach/vent while patient is in hospital unless MD given order for family to make changes. Dad verbalized that he would not touch trach or vent. Erica with respiratory called and asked to assess trach.

## 2015-01-18 NOTE — Progress Notes (Signed)
Patient ID: Roger HotterJoshua Tate, male   DOB: 11-Nov-1990, 24 y.o.   MRN: [id number]  Came back to the bedside at the request of the patient's father. Dr. Welton FlakesKhan pulmonary came to join me.  Patient's father was very upset that he was not put on a ventilator rate last night. This triggered him to be very tired. Likely contributed to his seizure last night. He states that he can have a fever or if he has a seizure. He is upset that no one spoke to him about why he is on the ventilator at night. He is on the ventilator at night because he does not take a deep enough breath.  Dr. Welton FlakesKhan from pulmonary will speak with the respiratory therapist about vent setting at night.  Hopefully tomorrow we can get the patient back to history collar and make a disposition as long as he is afebrile.  Time spent on reevaluation 15 minutes.  Dr. Alford Highlandichard Tacari Repass

## 2015-01-18 NOTE — Progress Notes (Signed)
Patient's Dad intermittently upset about patients progression.  Patient's Dad asking that patients tube feeds be stopped because he doesn't get tube feeds at home via his PEG he only gets medications.  Dad wants patient to be on trach collar and eat like he does at home.  Nurse explained to Dad that patient is not at his baseline secondary to infection.  We have consulted speech to reevaluate once patient is ready to do back on trach collar. Daily routine for vent management discussed with Dad.  Dad verbalized that patient is on trach collar during day and then on vent at night with rate of 10.  Dr. Welton FlakesKhan called and notified about vent routine at home and order obtained for trach collar during day and vent with rate at night once acute issue have been resolved.

## 2015-01-18 NOTE — Progress Notes (Signed)
Per pts father's request Dr. Renae GlossWieting and Dr. Welton FlakesKhan at bedside to discuss plan of care and questions about plans of discharge. Father agreeable to allow pt to rest comfortably on the ventilator today. Md's to reassess pt status in the morning to decide plan of care.

## 2015-01-18 NOTE — Progress Notes (Signed)
Patient ID: Roger Tate, male   DOB: 12-28-1990, 24 y.o.   MRN: [id number] [facility] [place] [facility]   PATIENT NAME: Roger Tate    MR#:  [id number]  DATE OF BIRTH:  September 19, 1990  SUBJECTIVE:  I took over his case secondary to the family requesting another doctor. As per the father at the bedside, he had a rough night with a seizure and did not sleep. I spoke with the nursing staff and he spiked a fever of 100.1. The father wants to take the patient home today, stating that the patient will get worse if he stays here.  REVIEW OF SYSTEMS:  Unobtainable  DRUG ALLERGIES:   Allergies  Allergen Reactions  . Sulfa Antibiotics Anaphylaxis  . Zosyn [Piperacillin-Tazobactam In Dex] Anaphylaxis  . Vancomycin   . Penicillins Rash    VITALS:  Blood pressure 118/67, pulse 116, temperature 98.4 F (36.9 C), temperature source Axillary, resp. rate 20, height  (1.651 m), weight 71.2 kg (156 lb 15.5 oz), SpO2 95 %.  PHYSICAL EXAMINATION:  GENERAL:  24 y.o.-year-old patient lying in the bed on the ventilator. Lethargic EYES: Pupils equal, round, reactive to light and accommodation. No scleral icterus.  HEENT: Head atraumatic, normocephalic.  NECK:  Supple, no jugular venous distention. Trach site is intact LUNGS: Coarse and decreased breath sounds bilaterally, no wheezing,  rhonchi throughout entire lung field. No use of accessory muscles of respiration.  CARDIOVASCULAR: S1, S2 tachycardic. No murmurs, rubs, or gallops.  ABDOMEN: Soft, nontender, nondistended. Bowel sounds present. No organomegaly or mass.  EXTREMITIES: No pedal edema, cyanosis, or clubbing.  NEUROLOGIC: History of brain injury. Able to squeeze hands bilaterally to command. Did not wiggle toes to command PSYCHIATRIC: The patient is alert but lethargic. SKIN: No obvious rash, lesion   LABORATORY PANEL:   CBC  Recent Labs Lab 01/18/15 0454  WBC 6.8  HGB 11.7*  HCT 34.5*  PLT 255     Chemistries   Recent Labs Lab 01/14/15 1228  01/18/15 0454  NA 135  < > 137  K 3.6  < > 4.2  CL 100*  < > 107  CO2 23  < > 25  GLUCOSE 136*  < > 213*  BUN 7  < > <5*  CREATININE 0.44*  < > 0.32*  CALCIUM 9.3  < > 8.6*  MG  --   < > 1.7  AST 104*  --   --   ALT 108*  --   --   ALKPHOS 59  --   --   BILITOT 0.8  --   --   < > = values in this interval not displayed.   RADIOLOGY:  Dg Chest Port 1 View  01/18/2015   CLINICAL DATA:  Hypoxia.  Pneumonia.  EXAM: PORTABLE CHEST - 1 VIEW  COMPARISON:  01/14/2015.  FINDINGS: Tracheostomy tube noted in good anatomic position. Cardiomegaly. Pulmonary vascularity normal. Mild right lower lobe infiltrate. Right pleural effusion. Interim near complete clearing of left lower lobe atelectasis and consolidation. No pneumothorax.  IMPRESSION: 1. Interim near complete clearing of left lower lobe atelectasis and/or consolidation. 2. Mild right lower lobe infiltrate and right pleural effusion .   Electronically Signed   By: Maisie Fus  Register   On: 01/18/2015 09:07     ASSESSMENT AND PLAN:   1. Sepsis with Proteus and MRSA in the tracheal aspirate. Bilateral Pneumonia on chest x-ray. Patient is on Zyvox and cefazolin. Patient did spike a fever this  morning of 100.1. The father wants to take the patient home. I felt uncomfortable sending the patient home with a rough night last night and a fever this morning. 2. Acute on chronic respiratory failure with hypoxia with a concern for aspiration. Continue mechanical ventilation today until pulmonary sees him. 3. Seizure last night- I switched Her to IV and increase the dose check a Depakote level. Neuro evaluation. As per father, does have seizures when he has infection. He does have 2 seizures per week at baseline. 4. Hypotension- improved with fluids 5. Leukocytosis- improved with antibiotics 6. Hyperglycemia-probably stress-induced-sliding scale 7. Nutrition  via tube feeds-follow up with speech  pathology..  Management plans discussed with the patient, family.  Father disagrees and would like to take the patient home. If he does take the patient home today would have to be AGAINST MEDICAL ADVICE. I told him that I would prescribe antibiotics.  CODE STATUS: full  TOTAL TIME TAKING CARE OF THIS PATIENT: 30 minutes.   Alford HighlandWIETING, Deral Schellenberg M.D on 01/18/2015 at 9:23 AM  Between 7am to 6pm - Pager - [phone] After 6pm go to [email or web address] - password [email or web address] [facility]  [facility]  Office  [phone]  CC: Primary care physician; Dorothey BasemanBRONSTEIN, DAVID, MD

## 2015-01-18 NOTE — Progress Notes (Signed)
10:39 patient had witnessed seizure with nurse and Dad present.  Seizure lasted 45 seconds.  Patient on vent, post seizure patient suctioned and Erica with respiratory rounded to assess respiratory status.  Currently patient is post ictal.  Dr. Hilton SinclairWeiting made aware.

## 2015-01-19 LAB — CULTURE, BLOOD (ROUTINE X 2): Culture: NO GROWTH

## 2015-01-19 LAB — BASIC METABOLIC PANEL
Anion gap: 9 (ref 5–15)
BUN: <5 mg/dL — ABNORMAL LOW (ref 6–20)
CO2: 22 mmol/L (ref 22–32)
Calcium: 8.6 mg/dL — ABNORMAL LOW (ref 8.9–10.3)
Chloride: 105 mmol/L (ref 101–111)
Creatinine, Ser: <0.3 mg/dL — ABNORMAL LOW (ref 0.61–1.24)
Glucose, Bld: 284 mg/dL — ABNORMAL HIGH (ref 65–99)
POTASSIUM: 4.1 mmol/L (ref 3.5–5.1)
Sodium: 136 mmol/L (ref 135–145)

## 2015-01-19 LAB — CULTURE, RESPIRATORY

## 2015-01-19 LAB — CULTURE, RESPIRATORY W GRAM STAIN: Special Requests: NORMAL

## 2015-01-19 LAB — GLUCOSE, CAPILLARY: GLUCOSE-CAPILLARY: 255 mg/dL — AB (ref 65–99)

## 2015-01-19 LAB — MAGNESIUM: MAGNESIUM: 1.7 mg/dL (ref 1.7–2.4)

## 2015-01-19 MED ORDER — CEFUROXIME AXETIL 500 MG PO TABS: 500.0000 mg | ORAL_TABLET | Freq: Two times a day (BID) | ORAL | Status: DC

## 2015-01-19 MED ORDER — MAGNESIUM SULFATE 2 GM/50ML IV SOLN
2.0000 g | Freq: Once | INTRAVENOUS | Status: AC
  Administered 2015-01-19: 2 g via INTRAVENOUS
  Filled 2015-01-19: qty 50

## 2015-01-19 MED ORDER — LINEZOLID 600 MG PO TABS: 600.0000 mg | ORAL_TABLET | Freq: Two times a day (BID) | ORAL | Status: DC

## 2015-01-19 MED ORDER — LEVETIRACETAM 250 MG PO TABS: 500.0000 mg | ORAL_TABLET | Freq: Two times a day (BID) | ORAL | Status: DC

## 2015-01-19 NOTE — Progress Notes (Addendum)
Patient ID: Roger Tate, male   DOB: Jan 19, 1991, 24 y.o.   MRN: [id number]  The patient has been in the hospital from 01/14/2015 until 01/19/2015. His father, Roger Tate has been at the bedside the entire hospitalization. Please excuse from work during this timeframe.  Dr. Alford Highlandichard Bernisha Verma.

## 2015-01-19 NOTE — Progress Notes (Signed)
Date: 01/19/2015,   MRN# [id number] Genia HotterJoshua Hach April 20, 1991 Code Status:     Code Status Orders        Start     Ordered   01/14/15 1708  Full code   Continuous     01/14/15 1708     Hosp day:@LENGTHOFSTAYDAYS @ Referring MD: @ATDPROV @         AdmissionWeight: 175 lb (79.379 kg)                 CurrentWeight: 158 lb 11.7 oz (72 kg)  Hpi: Awake, in good spirits, no hemoptysis, fever or chills spoke to his dad. To be d/c home today  PMHX:   Past Medical History  Diagnosis Date  . Brain injury 2002  . Diabetes mellitus without complication   . Seizures   . Asthma    Surgical Hx:  Past Surgical History  Procedure Laterality Date  . Brain surgery  2002  . Tracheostomy tube placement    . Sp perc place gastric tube    . Csf shunt     Family Hx:  History reviewed. No pertinent family history. Social Hx:   History  Substance Use Topics  . Smoking status: Never Smoker   . Smokeless tobacco: Not on file  . Alcohol Use: No   Medication:    Home Medication:  Current Outpatient Rx  Name  Route  Sig  Dispense  Refill  . cefUROXime (CEFTIN) 500 MG tablet   Oral   Take 1 tablet (500 mg total) by mouth 2 (two) times daily with a meal.   10 tablet   0   . levETIRAcetam (KEPPRA) 250 MG tablet   Oral   Take 2 tablets (500 mg total) by mouth 2 (two) times daily.   60 tablet   0   . linezolid (ZYVOX) 600 MG tablet   Oral   Take 1 tablet (600 mg total) by mouth 2 (two) times daily.   10 tablet   0     Current Medication: @CURMEDTAB @   Allergies:  Sulfa antibiotics; Zosyn; Vancomycin; and Penicillins  Review of Systems:  Physical Examination:   VS: BP 141/96 mmHg  Pulse 108  Temp(Src) 97.7 F (36.5 C) (Oral)  Resp 27  Ht 5\' 5"  (1.651 m)  Wt 158 lb 11.7 oz (72 kg)  BMI 26.41 kg/m2  SpO2 97%  General Appearance: No distress  Neuro: without focal findings, mental status, speech normal, alert and oriented, cranial nerves 2-12 intact, reflexes normal and  symmetric, sensation grossly normal  HEENT: PERRLA, EOM intact, no ptosis, no other lesions noticed, Mallampati: Pulmonary:.No wheezing, No rales  Sputum Production:   Cardiovascular:  Normal S1,S2.  No m/r/g.  Abdominal aorta pulsation normal.    Abdomen:Benign, Soft, non-tender, No masses, hepatosplenomegaly, No lymphadenopathy Endoc: No evident thyromegaly, no signs of acromegaly or Cushing features Skin:   warm, no rashes, no ecchymosis  Extremities: normal, no cyanosis, clubbing, no edema, warm with normal capillary refill. Other findings:   Labs results:   Recent Labs     01/17/15  0531  01/18/15  0454  01/19/15  0641  HGB   --   11.7*   --   HCT   --   34.5*   --   MCV   --   93.6   --   WBC   --   6.8   --   BUN  <5*  <5*  <5*  CREATININE  0.32*  0.32*  <0.30*  GLUCOSE  217*  213*  284*  CALCIUM  8.3*  8.6*  8.6*  ,  CLINICAL DATA: Hypoxia. Pneumonia.  EXAM: PORTABLE CHEST - 1 VIEW  COMPARISON: 01/14/2015.  FINDINGS: Tracheostomy tube noted in good anatomic position. Cardiomegaly. Pulmonary vascularity normal. Mild right lower lobe infiltrate. Right pleural effusion. Interim near complete clearing of left lower lobe atelectasis and consolidation. No pneumothorax.  IMPRESSION: 1. Interim near complete clearing of left lower lobe atelectasis and/or consolidation. 2. Mild right lower lobe infiltrate and right pleural effusion .   Electronically Signed  By: Maisie Fus Register  On: 01/18/2015 09:07  Other:   Assessment and Plan: PULMONARY-Acute respiratory failure with hypoxia, much improved -stable to go home -return to his baseline resp/vent management  ID Acute LLL Pneumonia secondary to Aspiration, CXR LOOKING BETTER -agree with d.c plans    I have personally obtained a history, examined the patient, evaluated laboratory and imaging results, formulated the assessment and plan and placed orders.  The Patient requires high complexity  decision making for assessment and support, frequent evaluation and titration of therapies, application of advanced monitoring technologies and extensive interpretation of multiple databases.   Dashanae Longfield,M.D. Pulmonary & Critical care Medicine [facility]

## 2015-01-19 NOTE — Progress Notes (Signed)
PHARMACY - CRITICAL CARE PROGRESS NOTE  Pharmacy Consult for Electrolyte Management  Indication: ICU Status   Allergies  Allergen Reactions  . Sulfa Antibiotics Anaphylaxis  . Zosyn [Piperacillin-Tazobactam In Dex] Anaphylaxis  . Vancomycin   . Penicillins Rash    Patient Measurements: Height: 5\' 5"  (165.1 cm) Weight: 158 lb 11.7 oz (72 kg) IBW/kg (Calculated) : 61.5   Vital Signs: Temp: 97.7 F (36.5 C) (07/12 0728) Temp Source: Oral (07/12 0728) BP: 119/84 mmHg (07/12 0800) Pulse Rate: 92 (07/12 0800) Intake/Output from previous day: 07/11 0701 - 07/12 0700 In: 5730 [I.V.:2875; NG/GT:1840; IV Piggyback:1015] Out: 3250 [Urine:3250] Intake/Output from this shift: Total I/O In: 80 [NG/GT:80] Out: -  Vent settings for last 24 hours: Vent Mode:  [-] PRVC FiO2 (%):  [30 %-35 %] 35 % Set Rate:  [10 bmp] 10 bmp Vt Set:  [350 mL] 350 mL PEEP:  [5 cmH20] 5 cmH20  Labs:  Recent Labs  01/17/15 0531 01/18/15 0454 01/19/15 0641  WBC  --  6.8  --   HGB  --  11.7*  --   HCT  --  34.5*  --   PLT  --  255  --   CREATININE 0.32* 0.32* <0.30*  MG 1.8 1.7 1.7  PHOS 3.3 3.0  --    CrCl cannot be calculated (Patient has no serum creatinine result on file.).   Recent Labs  01/18/15 1954 01/18/15 2359  GLUCAP 210* 255*    Microbiology: Recent Results (from the past 720 hour(s))  Blood Culture (routine x 2)     Status: None   Collection Time: 01/14/15 12:28 PM  Result Value Ref Range Status   Specimen Description BLOOD  Final   Special Requests NONE  Final   Culture NO GROWTH 5 DAYS  Final   Report Status 01/19/2015 FINAL  Final  Culture, expectorated sputum-assessment     Status: None   Collection Time: 01/14/15 12:45 PM  Result Value Ref Range Status   Specimen Description ENDOTRACHEAL  Final   Special Requests NONE  Final   Sputum evaluation THIS SPECIMEN IS ACCEPTABLE FOR SPUTUM CULTURE  Final   Report Status 01/14/2015 FINAL  Final  Culture, respiratory  (NON-Expectorated)     Status: None   Collection Time: 01/14/15 12:45 PM  Result Value Ref Range Status   Specimen Description ENDOTRACHEAL  Final   Special Requests NONE Reflexed from [id number]  Final   Gram Stain   Final    MANY WBC SEEN FEW GRAM NEGATIVE RODS FEW GRAM POSITIVE COCCI IN CLUSTERS IN PAIRS GOOD SPECIMEN - 80-90% WBCS    Culture   Final    MODERATE GROWTH METHICILLIN RESISTANT STAPHYLOCOCCUS AUREUS LIGHT GROWTH PROTEUS MIRABILIS CRITICAL RESULT CALLED TO, READ BACK BY AND VERIFIED WITH: PAM MYERS AT 1310 01/16/15 CTJ    Report Status 01/17/2015 FINAL  Final   Organism ID, Bacteria METHICILLIN RESISTANT STAPHYLOCOCCUS AUREUS  Final   Organism ID, Bacteria PROTEUS MIRABILIS  Final      Susceptibility   Proteus mirabilis - MIC*    AMPICILLIN >=32 RESISTANT Resistant     CEFAZOLIN <=4 SENSITIVE Sensitive     CEFTRIAXONE <=1 SENSITIVE Sensitive     CIPROFLOXACIN >=4 RESISTANT Resistant     GENTAMICIN <=1 SENSITIVE Sensitive     IMIPENEM 2 SENSITIVE Sensitive     NITROFURANTOIN 128 RESISTANT Resistant     TRIMETH/SULFA >=320 RESISTANT Resistant     CEFTAZIDIME Value in next row Sensitive  SENSITIVE<=1    PIP/TAZO Value in next row Sensitive      SENSITIVE<=4    LEVOFLOXACIN Value in next row Resistant      RESISTANT>=8    ERTAPENEM Value in next row Sensitive      SENSITIVE<=0.5    * LIGHT GROWTH PROTEUS MIRABILIS   Methicillin resistant staphylococcus aureus - MIC*    CIPROFLOXACIN Value in next row Resistant      SENSITIVE<=0.5    GENTAMICIN Value in next row Sensitive      SENSITIVE<=0.5    OXACILLIN Value in next row Resistant      SENSITIVE<=0.5    VANCOMYCIN Value in next row Sensitive      SENSITIVE<=0.5    TRIMETH/SULFA Value in next row Sensitive      SENSITIVE<=0.5    CEFOXITIN SCREEN Value in next row Resistant      POSITIVECEFOXITIN SCREEN - This test may be used to predict mecA-mediated oxacillin resistance, and it is based on the cefoxitin disk  screen test.  The cefoxitin screen and oxacillin work in combination to determine the final interpretation reported for oxacillin.     Inducible Clindamycin Value in next row Sensitive      POSITIVECEFOXITIN SCREEN - This test may be used to predict mecA-mediated oxacillin resistance, and it is based on the cefoxitin disk screen test.  The cefoxitin screen and oxacillin work in combination to determine the final interpretation reported for oxacillin.     ERYTHROMYCIN Value in next row Resistant      RESISTANT>=8    TETRACYCLINE Value in next row Sensitive      SENSITIVE<=1    CLINDAMYCIN Value in next row Resistant      RESISTANT>=8    LEVOFLOXACIN Value in next row Resistant      RESISTANT>=8    LINEZOLID Value in next row Sensitive      SENSITIVE2    * MODERATE GROWTH METHICILLIN RESISTANT STAPHYLOCOCCUS AUREUS  Blood Culture (routine x 2)     Status: None (Preliminary result)   Collection Time: 01/14/15  1:15 PM  Result Value Ref Range Status   Specimen Description BLOOD LH  Final   Special Requests BOTTLES DRAWN AEROBIC AND ANAEROBIC  Final   Culture  Setup Time   Final    GRAM POSITIVE COCCI IN BOTH AEROBIC AND ANAEROBIC BOTTLES IDENTIFICATION TO FOLLOW CRITICAL RESULT CALLED TO, READ BACK BY AND VERIFIED WITH: PAM MYERS ON 01/15/15 AT 1100AM BY JEF CRITICAL VALUE NOTED.  VALUE IS CONSISTENT WITH PREVIOUSLY REPORTED AND CALLED VALUE.    Culture   Final    STAPHYLOCOCCUS CAPITIS IN BOTH AEROBIC AND ANAEROBIC BOTTLES COAGULASE NEGATIVE STAPHYLOCOCCUS ANAEROBIC BOTTLE ONLY POSSIBLE CONTAMINATION WITH SKIN FLORA    Report Status PENDING  Incomplete   Organism ID, Bacteria STAPHYLOCOCCUS CAPITIS  Final      Susceptibility   Staphylococcus capitis - MIC*    CIPROFLOXACIN 2 INTERMEDIATE Intermediate     ERYTHROMYCIN <=0.25 SENSITIVE Sensitive     GENTAMICIN <=0.5 SENSITIVE Sensitive     OXACILLIN Value in next row Sensitive      <=0.25 SENSITIVEWARNING: For  oxacillin-resistant S.aureus and coagulase-negative staphylococci (MRS), other beta-lactam agents, ie, penicillins, beta-lactam/beta-lactamase inhibitor combinations, cephems (with the exception of the cephalosporins with anti-MRSA activity), and carbapenems, may appear active in vitro, but are not effective clinically.  --CLSI, Vol.32 No.3, January 2012, pg 70.    TETRACYCLINE Value in next row Sensitive      <=0.25 SENSITIVEWARNING: For oxacillin-resistant  S.aureus and coagulase-negative staphylococci (MRS), other beta-lactam agents, ie, penicillins, beta-lactam/beta-lactamase inhibitor combinations, cephems (with the exception of the cephalosporins with anti-MRSA activity), and carbapenems, may appear active in vitro, but are not effective clinically.  --CLSI, Vol.32 No.3, January 2012, pg 70.    CLINDAMYCIN Value in next row Sensitive      <=0.25 SENSITIVEWARNING: For oxacillin-resistant S.aureus and coagulase-negative staphylococci (MRS), other beta-lactam agents, ie, penicillins, beta-lactam/beta-lactamase inhibitor combinations, cephems (with the exception of the cephalosporins with anti-MRSA activity), and carbapenems, may appear active in vitro, but are not effective clinically.  --CLSI, Vol.32 No.3, January 2012, pg 70.    LEVOFLOXACIN Value in next row Intermediate      INTERMEDIATE4    * STAPHYLOCOCCUS CAPITIS  MRSA PCR Screening     Status: None   Collection Time: 01/14/15  5:25 PM  Result Value Ref Range Status   MRSA by PCR NEGATIVE NEGATIVE Final    Comment:        The GeneXpert MRSA Assay (FDA approved for NASAL specimens only), is one component of a comprehensive MRSA colonization surveillance program. It is not intended to diagnose MRSA infection nor to guide or monitor treatment for MRSA infections.   Urine culture     Status: None   Collection Time: 01/15/15  2:12 AM  Result Value Ref Range Status   Specimen Description URINE, CLEAN CATCH  Final   Special Requests NONE   Final   Culture NO GROWTH 1 DAY  Final   Report Status 01/16/2015 FINAL  Final  Culture, expectorated sputum-assessment     Status: None   Collection Time: 01/15/15 12:35 PM  Result Value Ref Range Status   Specimen Description TRACHEAL ASPIRATE  Final   Special Requests Normal  Final   Sputum evaluation THIS SPECIMEN IS ACCEPTABLE FOR SPUTUM CULTURE  Final   Report Status 01/15/2015 FINAL  Final  Culture, respiratory (NON-Expectorated)     Status: None (Preliminary result)   Collection Time: 01/15/15 12:35 PM  Result Value Ref Range Status   Specimen Description TRACHEAL ASPIRATE  Final   Special Requests Normal Reflexed from [id number]  Final   Gram Stain   Final    MODERATE WBC SEEN FEW GRAM POSITIVE COCCI GOOD SPECIMEN - 80-90% WBCS    Culture   Final    LIGHT GROWTH PROTEUS MIRABILIS LIGHT GROWTH METHICILLIN RESISTANT STAPHYLOCOCCUS AUREUS CRITICAL RESULT CALLED TO, READ BACK BY AND VERIFIED WITH: RACHAEL VERDI AT 1300 01/18/15 DV RULING OUT POSSIBLE SECOND GRAM NEGATIVE ROD    Report Status PENDING  Incomplete   Organism ID, Bacteria PROTEUS MIRABILIS  Final   Organism ID, Bacteria METHICILLIN RESISTANT STAPHYLOCOCCUS AUREUS  Final      Susceptibility   Proteus mirabilis - MIC*    AMPICILLIN >=32 RESISTANT Resistant     CEFAZOLIN 8 SENSITIVE Sensitive     CEFTRIAXONE <=1 SENSITIVE Sensitive     CIPROFLOXACIN >=4 RESISTANT Resistant     GENTAMICIN <=1 SENSITIVE Sensitive     IMIPENEM 2 SENSITIVE Sensitive     NITROFURANTOIN 64 RESISTANT Resistant     TRIMETH/SULFA >=320 RESISTANT Resistant     CEFTAZIDIME Value in next row Sensitive      SENSITIVE<=1    PIP/TAZO Value in next row Sensitive      SENSITIVE<=4    LEVOFLOXACIN Value in next row Resistant      RESISTANT>=8    AMPICILLIN/SULBACTAM Value in next row Sensitive      SENSITIVE<=2    TOBRAMYCIN Value in  next row Sensitive      SENSITIVE<=1    * LIGHT GROWTH PROTEUS MIRABILIS   Methicillin resistant  staphylococcus aureus - MIC*    CIPROFLOXACIN Value in next row Resistant      SENSITIVE<=1    GENTAMICIN Value in next row Sensitive      SENSITIVE<=1    OXACILLIN Value in next row Resistant      SENSITIVE<=1    VANCOMYCIN Value in next row Sensitive      SENSITIVE<=1    TRIMETH/SULFA Value in next row Sensitive      SENSITIVE<=1    CEFOXITIN SCREEN Value in next row Resistant      POSITIVECEFOXITIN SCREEN - This test may be used to predict mecA-mediated oxacillin resistance, and it is based on the cefoxitin disk screen test.  The cefoxitin screen and oxacillin work in combination to determine the final interpretation reported for oxacillin.     Inducible Clindamycin Value in next row Sensitive      POSITIVECEFOXITIN SCREEN - This test may be used to predict mecA-mediated oxacillin resistance, and it is based on the cefoxitin disk screen test.  The cefoxitin screen and oxacillin work in combination to determine the final interpretation reported for oxacillin.     TETRACYCLINE Value in next row Sensitive      SENSITIVE<=1    LEVOFLOXACIN Value in next row Resistant      RESISTANT>=8    CLINDAMYCIN Value in next row Resistant      RESISTANT>=8    * LIGHT GROWTH METHICILLIN RESISTANT STAPHYLOCOCCUS AUREUS  C difficile quick scan w PCR reflex ([facility] only)     Status: None   Collection Time: 01/16/15  6:12 PM  Result Value Ref Range Status   C Diff antigen NEGATIVE  Final   C Diff toxin NEGATIVE  Final   C Diff interpretation Negative for C. difficile  Final    Medications:  Scheduled:  . albuterol  2.5 mg Nebulization Q4H  . antiseptic oral rinse  7 mL Mouth Rinse QID  . baclofen  10 mg Oral BID  . budesonide (PULMICORT) nebulizer solution  0.5 mg Nebulization BID  .  ceFAZolin (ANCEF) IV  1 g Intravenous 3 times per day  . chlorhexidine  15 mL Mouth Rinse BID  . feeding supplement (PRO-STAT SUGAR FREE 64)  30 mL Oral TID  . heparin  5,000 Units Subcutaneous 3 times per day  .  lactulose  20 g Oral BID  . levETIRAcetam  500 mg Intravenous Q12H  . linezolid  600 mg Intravenous Q12H  . magnesium sulfate 1 - 4 g bolus IVPB  2 g Intravenous Once  . ranitidine  150 mg Oral BID  . Valproic Acid  500 mg Per Tube BID  . Valproic Acid  750 mg Per Tube QHS   Infusions:  . sodium chloride 125 mL/hr at 01/18/15 1309  . feeding supplement (JEVITY 1.2 CAL) 1,000 mL (01/18/15 0846)  . free water      Assessment: 24 yo male with history significant for TBI, chronic trach, and seizures currently in ICU. Patient is on day 6 of linezolid and day 3 of cefazolin. Patient previously received 3 days of meropenem.     Plan:   1. Electrolytes : Will order magnesium 2g IV x 1 to maintain magnesium level > 2. Will recheck electrolytes with am labs.    2. Hyperglycemia: Patient with persistent hyperglycemia over the past 24-48 hours. Please consider starting insulin therapy to  maintain blood glucose < 180.    Pharmacy will continue to monitor and adjust per consult.    Francisca Langenderfer L 01/19/2015,8:57 AM

## 2015-01-19 NOTE — Progress Notes (Signed)
Pt was discharged to home via EMS. Father with pt. Report given. VSS.  Sat 98 % on room air.  Suctioned for small white secretions just prior to transfer.  Discharge packet and instruction completed with patient father. Pt is so happy to be headed home.

## 2015-01-19 NOTE — Progress Notes (Signed)
Inpatient Diabetes Program Recommendations  AACE/ADA: New Consensus Statement on Inpatient Glycemic Control (2013)  Target Ranges:  Prepandial:   less than 140 mg/dL      Peak postprandial:   less than 180 mg/dL (1-2 hours)      Critically ill patients:  140 - 180 mg/dL    Results for Roger Tate, Roger Tate (MRN [id number]) as of 01/19/2015 08:09  Ref. Range 01/15/2015 06:40 01/15/2015 13:24 01/16/2015 07:28 01/18/2015 19:54 01/18/2015 23:59  Glucose-Capillary Latest Ref Range: 65-99 mg/dL 409116 (H) 811166 (H) 914175 (H) 210 (H) 255 (H)   Reason for assessment: elevated CBG  Diabetes history: yes Outpatient Diabetes medications: Novolog 2 units pre meals for CBG > 200mg /dl Current orders for Inpatient glycemic control: none  Please consider adding Novolog sensitive correction scale 0-9 units tid with meals and 0-5 units qhs  Consider starting Lantus 10 units qhs (~0.15units/kg)   Susette RacerJulie Norvella Loscalzo, RN, BA, [place]HA, CDE Diabetes Coordinator Inpatient Diabetes Program  [phone] (Team Pager) [phone] [facility] Office) 01/19/2015 8:10 AM

## 2015-01-19 NOTE — Discharge Instructions (Signed)

## 2015-01-19 NOTE — Evaluation (Signed)
Clinical/Bedside Swallow Evaluation Patient Details  Name: Roger Tate MRN: [id number] Date of Birth: 11-21-1990  Today's Date: 01/19/2015 Time: SLP Start Time (ACUTE ONLY): 0830 SLP Stop Time (ACUTE ONLY): 0930 SLP Time Calculation (min) (ACUTE ONLY): 60 min  Past Medical History:  Past Medical History  Diagnosis Date  . Brain injury 2002  . Diabetes mellitus without complication   . Seizures   . Asthma    Past Surgical History:  Past Surgical History  Procedure Laterality Date  . Brain surgery  2002  . Tracheostomy tube placement    . Sp perc place gastric tube    . Csf shunt     HPI:  Pt is is a 24 y.o. male with a known history of multiple medical problems including TBI, status post ventriculoperitoneal shunt, tracheostomy, PEG tube placement, chronic respiratory failure on ventilator at nighttime at home , who presents to the hospital with complaints of hemoptysis and cough with sputum production. Apparently patient had an aspiration episode of week ago after which a he was suctioned vigorously by a home health nurse, leading to mild hemoptysis approximately a week ago. Patient was seen in the emergency room, he was prescribed levofloxacin after he was noted to have left lower lobe pneumonia on chest x-ray. He finished his levofloxacin course, however, has been having problems with cough as well as shortness of breath. Patient's family noted that he still has continuous hemoptysis. Also is not able to talk and the has been coughing vigorously which is what Father stated was the tx for "anytime when pt aspirates". Father acknowledged pt does have "some" aspiration "at times" but pt is usually able to cough and clear it vs having to tracheally suction it. Pt has been septic w/ respiratory failure requring vent support during this hospitalization. Pt was weanted to trach collar last PM/early AM and is not alert/awake. MD requested BSE as pt/Father would like to discharge home "today".     Assessment / Plan / Recommendation Clinical Impression  Pt appeared to present w/ adequate toleration of po's of thin liquids and soft solids following strict aspiration precautions and feeding w/ caution using small, single bites and sips. Pt exhibited min. increased open mouth, munching patten w/ mastication of soft solid trials but was able to clear w/ time and f/u w/ sips of liquid. Pt is presented w/ 1-2 bites of food followed by 1-2 sips of liquids - Father stated this is how pt is fed at home. Father stated he understands pt has "a little aspiration sometimes, even on his own saliva" but that they manage this at home by having pt "cough hard to clear it". Pt is dependent for feeding. Father gives pt a regular diet but cuts bites small. Per Father, and per SLP's acknowledgement, pt appears at his baseline w/ oral intake. Father stated they supplement w/ water via the PEG for hydration. Rec. meds via PEG as well for easier intake overall. Father is eager for pt to start and oral diet. Will monitor first meal before pt discharges home. MD consulted, updated, and agreed. NSG updated.     Aspiration Risk  Mild    Diet Recommendation Age appropriate regular solids;Thin   Medication Administration:  (via PEG) Compensations: Slow rate;Small sips/bites (alternate food/liquid)    Other  Recommendations Oral Care Recommendations: Oral care BID;Oral care before and after PO;Staff/trained caregiver to provide oral care Other Recommendations:  (dietician consult as Arther Dames.)   Follow Up Recommendations  Frequency and Duration min 3x week  1 week   Pertinent Vitals/Pain denied    SLP Swallow Goals  see care plan   Swallow Study Prior Functional Status   pt resides at home w/ family who care for him. Pt has baseline Dysphagia sec. To TBI several years ago. Father stated they are able to manage and oral diet w/ pt at home as well as use the PEG for hydration. Pt requires vent support at night  and at times during the day. He uses the PMV during the day for verbal communication and po's w/out difficulty.     General Date of Onset: 01/14/15 Other Pertinent Information: Pt is is a 24 y.o. male with a known history of multiple medical problems including TBI, status post ventriculoperitoneal shunt, tracheostomy, PEG tube placement, chronic respiratory failure on ventilator at nighttime at home , who presents to the hospital with complaints of hemoptysis and cough with sputum production. Apparently patient had an aspiration episode of week ago after which a he was suctioned vigorously by a home health nurse, leading to mild hemoptysis approximately a week ago. Patient was seen in the emergency room, he was prescribed levofloxacin after he was noted to have left lower lobe pneumonia on chest x-ray. He finished his levofloxacin course, however, has been having problems with cough as well as shortness of breath. Patient's family noted that he still has continuous hemoptysis. Also is not able to talk and the has been coughing vigorously which is what Father stated was the tx for "anytime when pt aspirates". Father acknowledged pt does have "some" aspiration "at times" but pt is usually able to cough and clear it vs having to tracheally suction it. Pt has been septic w/ respiratory failure requring vent support during this hospitalization. Pt was weanted to trach collar last PM/early AM and is not alert/awake. MD requested BSE as pt/Father would like to discharge home "today".  Type of Study: Bedside swallow evaluation Previous Swallow Assessment:  (Father stated pt has had a "cookie swallow test" in the past) Diet Prior to this Study: Regular;Thin liquids (pt is fed a 1-2 bites of food, then a 1-2 sips of liquid ) Temperature Spikes Noted: No (wbc 6.8) Respiratory Status: Room air Trach Size and Type: With PMSV in place (baseline tracheostomy ) History of Recent Intubation:  (baseline tracheostomy; on  vent support at night at home) Behavior/Cognition: Alert;Cooperative;Requires cueing;Distractible Oral Cavity - Dentition: Adequate natural dentition/normal for age Self-Feeding Abilities: Total assist Patient Positioning: Upright in bed (head forward) Baseline Vocal Quality: Normal Volitional Cough: Strong Volitional Swallow:  (unable to assess sec. to head movements and trach)    Oral/Motor/Sensory Function Overall Oral Motor/Sensory Function:  (difficult to assess sec. to pt's follow through/activeness) Labial ROM: Within Functional Limits Labial Symmetry: Within Functional Limits Labial Strength: Within Functional Limits Lingual ROM: Within Functional Limits Lingual Symmetry: Within Functional Limits Lingual Strength: Within Functional Limits Facial Symmetry: Within Functional Limits   Ice Chips Ice chips: Within functional limits Presentation:  (fed by SLP x1 trial(did not want more))   Thin Liquid Thin Liquid: Within functional limits Presentation: Straw (fed by SLP; straw use baseline for pt) Other Comments: took small, min. amounts each sip    Nectar Thick Nectar Thick Liquid: Not tested   Honey Thick Honey Thick Liquid: Not tested   Puree Puree: Within functional limits Presentation: Spoon (fed by SLP; 6 trials)   Solid   GO    Solid: Impaired Presentation:  (fed by  SLP; 2 trials) Oral Phase Impairments:  (munching pattern w/ mastication) Oral Phase Functional Implications:  (cleared w/ time and followed by sips of liquids) Pharyngeal Phase Impairments:  (none w/ all trials) Other Comments: pt easily distracted; talking during/with po trials       Watson,Katherine 01/19/2015,11:32 AM

## 2015-01-19 NOTE — Care Management Note (Signed)
Case Management Note  Patient Details  Name: Roger Tate MRN: [id number] Date of Birth: Feb 23, 1991  Subjective/Objective: Email sent to Theodis Sato with [facility] with discharge plans and resumption of care orders. Met with patient's dad at bedside and patient has all needed equipment at home. All questions answered. No acute concerns. Patient discharging on PO antibiotics. CSW will arrange discharge via EMS.                   Action/Plan: Home with home health/ [facility] (resumption of care)  Expected Discharge Date:                  Expected Discharge Plan:  [place]  In-House Referral:     Discharge planning Services     Post Acute Care Choice:    Choice offered to:     DME Arranged:    DME Agency:     HH Arranged:    [place]:  [place]  Status of Service:  Completed, signed off  Medicare Important Message Given:    Date Medicare IM Given:    Medicare IM give by:    Date Additional Medicare IM Given:    Additional Medicare Important Message give by:     If discussed at [place] of Stay Meetings, dates discussed:    Additional Comments:  Jolly Mango, RN 01/19/2015, 10:41 AM

## 2015-01-19 NOTE — Discharge Summary (Addendum)
[facility] [place] [facility]   PATIENT NAME: Roger HotterJoshua Tate    MR#:  [id number]  DATE OF BIRTH:  10/24/1990  DATE OF ADMISSION:  01/14/2015 ADMITTING PHYSICIAN: Katharina Caperima [place], MD  DATE OF DISCHARGE: 01/19/2015  PRIMARY CARE PHYSICIAN: Dorothey BasemanBRONSTEIN, DAVID, MD    ADMISSION DIAGNOSIS:  Pneumonia, organism unspecified [J18.9] Sepsis, due to unspecified organism [A41.9]  DISCHARGE DIAGNOSIS:  Principal Problem:   Sepsis Active Problems:   Acute on chronic respiratory failure   Left lower lobe pneumonia   Hypotension   S/P ventriculoperitoneal shunt   H/O tracheostomy   S/P percutaneous endoscopic gastrostomy (PEG) tube placement   Scoliosis   Ankylosis of left hand   SECONDARY DIAGNOSIS:   Past Medical History  Diagnosis Date  . Brain injury 2002  . Diabetes mellitus without complication   . Seizures   . Asthma     HOSPITAL COURSE:   1. Clinical sepsis, bilateral pneumonia. MRSA and Proteus grew out of the tracheal aspirate. The patient was initially on Zyvox and meropenem. Patient was switched from meropenem over to Ancef. Upon discharge I will continue course of Zyvox and Ceftin to complete a 10 day course of antibiotics. 2. Seizure disorder- patient had 2 seizures on 01/18/2015. The patient's Keppra was switched to IV and increase dose. The patient's Depakote level was a little low and an IV dose of Depakote was given. I will increase the dose of Keppra to 500 mg twice a day upon discharge. 3. Chronic respiratory failure using the ventilator at night. Father has all the needed equipment at home and will follow-up with home health. 4. Diabetes mellitus without complication 5. Traumatic brain injury in 2002.  DISCHARGE CONDITIONS:   Satisfactory  CONSULTS OBTAINED:  Treatment Team:  Judyann Munsonynthia Snider, MD  DRUG ALLERGIES:   Allergies  Allergen Reactions  . Sulfa Antibiotics Anaphylaxis  . Zosyn [Piperacillin-Tazobactam In Dex]  Anaphylaxis  . Vancomycin   . Penicillins Rash    DISCHARGE MEDICATIONS:   Current Discharge Medication List    START taking these medications   Details  cefUROXime (CEFTIN) 500 MG tablet Take 1 tablet (500 mg total) by mouth 2 (two) times daily with a meal. Qty: 10 tablet, Refills: 0    linezolid (ZYVOX) 600 MG tablet Take 1 tablet (600 mg total) by mouth 2 (two) times daily. Qty: 10 tablet, Refills: 0      CONTINUE these medications which have CHANGED   Details  levETIRAcetam (KEPPRA) 250 MG tablet Take 2 tablets (500 mg total) by mouth 2 (two) times daily. Qty: 60 tablet, Refills: 0      CONTINUE these medications which have NOT CHANGED   Details  albuterol (PROVENTIL) (2.5 MG/3ML) 0.083% nebulizer solution Inhale 3 mLs into the lungs 4 (four) times daily as needed.    Ascorbic Acid (VITAMIN C) 1000 MG tablet Take 1 tablet by mouth daily.    baclofen (LIORESAL) 10 MG tablet Take 1 tablet by mouth 2 (two) times daily.    bisacodyl (LAXATIVE) 10 MG suppository Place 1 suppository rectally at bedtime as needed.    budesonide (PULMICORT) 0.25 MG/2ML nebulizer solution Inhale 2 mLs into the lungs every 12 (twelve) hours.    calcium carbonate (TUMS EX) 750 MG chewable tablet Chew 1 tablet by mouth daily.    cetirizine (ZYRTEC) 10 MG tablet Take 10 mg by mouth as needed for allergies.    clonazePAM (KLONOPIN) 1 MG tablet Take 1 mg by mouth as needed for anxiety.  Dextromethorphan-Guaifenesin (GUAIFENESIN DM) 400-20 MG TABS Take 1 tablet by mouth every 12 (twelve) hours.    diphenhydrAMINE (BENADRYL) 25 mg capsule Take 1 tablet by mouth daily.    EPINEPHrine (EPIPEN JR) 0.15 MG/0.3ML injection Inject 0.15 mg into the muscle as needed.    FIBER PO Take 1 Dose by mouth daily.    glucagon (GLUCAGON EMERGENCY) 1 MG injection Inject 1 mg into the muscle as needed.    insulin aspart (NOVOLOG) 100 UNIT/ML injection Inject 2 Units into the skin as needed for high blood sugar.  Prn before meals for blood glucose greater than 200    ketoconazole (NIZORAL) 2 % cream Apply 1 application topically daily.    lactulose (CHRONULAC) 10 GM/15ML solution Take 30 mLs by mouth 2 (two) times daily.    neomycin-bacitracin-polymyxin (NEOSPORIN) ophthalmic ointment 1 application daily.    nystatin ointment (MYCOSTATIN) 1 application daily.    ranitidine (ZANTAC) 75 MG/5ML syrup Take 10 mLs by mouth 2 (two) times daily. Refills: 4    !! valproic acid (DEPAKENE) 250 MG capsule Take 500 mg by mouth 2 (two) times daily.    !! valproic acid (DEPAKENE) 250 MG capsule Take 750 mg by mouth at bedtime.     !! - Potential duplicate medications found. Please discuss with provider.    STOP taking these medications     levofloxacin (LEVAQUIN) 750 MG tablet          DISCHARGE INSTRUCTIONS:   Follow-up with Dr. Terance Hart one week.  If you experience worsening of your admission symptoms, develop shortness of breath, life threatening emergency, suicidal or homicidal thoughts you must seek medical attention immediately by calling 911 or calling your MD immediately  if symptoms less severe.  You Must read complete instructions/literature along with all the possible adverse reactions/side effects for all the Medicines you take and that have been prescribed to you. Take any new Medicines after you have completely understood and accept all the possible adverse reactions/side effects.   Please note  You were cared for by a hospitalist during your hospital stay. If you have any questions about your discharge medications or the care you received while you were in the hospital after you are discharged, you can call the unit and asked to speak with the hospitalist on call if the hospitalist that took care of you is not available. Once you are discharged, your primary care physician will handle any further medical issues. Please note that NO REFILLS for any discharge medications will be  authorized once you are discharged, as it is imperative that you return to your primary care physician (or establish a relationship with a primary care physician if you do not have one) for your aftercare needs so that they can reassess your need for medications and monitor your lab values.    Today   CHIEF COMPLAINT:   Chief Complaint  Patient presents with  . Respiratory Distress    HISTORY OF PRESENT ILLNESS:  Roger Tate  is a 24 y.o. male with a known history of dramatic brain injury presented with acute respiratory failure, clinical sepsis and found to have bilateral pneumonia which grew out MRSA and Proteus   VITAL SIGNS:  Blood pressure 141/96, pulse 108, temperature 97.7 F (36.5 C), temperature source Oral, resp. rate 27, height 5\' 5"  (1.651 m), weight 72 kg (158 lb 11.7 oz), SpO2 97 %.  I/O:   Intake/Output Summary (Last 24 hours) at 01/19/15 1008 Last data filed at 01/19/15 [phone]  Gross per 24 hour  Intake   5620 ml  Output   3225 ml  Net   2395 ml    PHYSICAL EXAMINATION:  GENERAL:  24 y.o.-year-old patient lying in the bed with no acute distress.  EYES: Pupils equal, round, reactive to light and accommodation. No scleral icterus. Extraocular muscles intact.  HEENT: Head atraumatic, normocephalic. Oropharynx and nasopharynx clear.  NECK:  Supple, no jugular venous distention. No thyroid enlargement, no tenderness.  LUNGS: Normal breath sounds bilaterally, some rhonchi bilaterally- less after suctioning. No use of accessory muscles of respiration.  CARDIOVASCULAR: S1, S2 tachycardic. No murmurs, rubs, or gallops.  ABDOMEN: Soft, non-tender, non-distended. Bowel sounds present. No organomegaly or mass.  EXTREMITIES: No pedal edema, cyanosis, or clubbing.  NEUROLOGIC: Patient moving his arms and his own. PSYCHIATRIC: The patient is alert. Answers questions appropriately. SKIN: No obvious rash, lesion, or ulcer.   DATA REVIEW:   CBC  Recent Labs Lab  01/18/15 0454  WBC 6.8  HGB 11.7*  HCT 34.5*  PLT 255    Chemistries   Recent Labs Lab 01/14/15 1228  01/19/15 0641  NA 135  < > 136  K 3.6  < > 4.1  CL 100*  < > 105  CO2 23  < > 22  GLUCOSE 136*  < > 284*  BUN 7  < > <5*  CREATININE 0.44*  < > <0.30*  CALCIUM 9.3  < > 8.6*  MG  --   < > 1.7  AST 104*  --   --   ALT 108*  --   --   ALKPHOS 59  --   --   BILITOT 0.8  --   --   < > = values in this interval not displayed.    RADIOLOGY:  Dg Chest Port 1 View  01/18/2015   CLINICAL DATA:  Hypoxia.  Pneumonia.  EXAM: PORTABLE CHEST - 1 VIEW  COMPARISON:  01/14/2015.  FINDINGS: Tracheostomy tube noted in good anatomic position. Cardiomegaly. Pulmonary vascularity normal. Mild right lower lobe infiltrate. Right pleural effusion. Interim near complete clearing of left lower lobe atelectasis and consolidation. No pneumothorax.  IMPRESSION: 1. Interim near complete clearing of left lower lobe atelectasis and/or consolidation. 2. Mild right lower lobe infiltrate and right pleural effusion .   Electronically Signed   By: Maisie Fus  Register   On: 01/18/2015 09:07    Management plans discussed with the patient, family and they are in agreement.  CODE STATUS:     Code Status Orders        Start     Ordered   01/14/15 1708  Full code   Continuous     01/14/15 1708      TOTAL TIME TAKING CARE OF THIS PATIENT: 40 minutes.    Alford Highland M.D on 01/19/2015 at 10:08 AM  Between 7am to 6pm - Pager - [phone]  After 6pm go to [email or web address] - password [email or web address] [facility]  [facility]  Office  [phone]  CC: Primary care physician; Dorothey Baseman, MD

## 2015-01-19 NOTE — Plan of Care (Signed)
Problem: SLP Dysphagia Goals Goal: Misc Dysphagia Goal Pt will safely tolerate po diet of least restrictive consistency w/ no overt s/s of aspiration noted by Staff/pt/family x3 sessions.    

## 2015-02-11 ENCOUNTER — Other Ambulatory Visit: Payer: Self-pay | Admitting: Family Medicine

## 2015-02-11 DIAGNOSIS — Z87820 Personal history of traumatic brain injury: Secondary | ICD-10-CM

## 2015-02-17 ENCOUNTER — Ambulatory Visit: Payer: Medicaid Other

## 2015-02-17 ENCOUNTER — Ambulatory Visit: Payer: Medicaid Other | Attending: Family Medicine

## 2015-02-24 ENCOUNTER — Ambulatory Visit: Payer: Medicaid Other | Attending: Family Medicine

## 2015-02-24 ENCOUNTER — Other Ambulatory Visit: Payer: Self-pay | Admitting: Family Medicine

## 2015-02-24 DIAGNOSIS — R569 Unspecified convulsions: Secondary | ICD-10-CM

## 2015-03-06 ENCOUNTER — Emergency Department
Admission: EM | Admit: 2015-03-06 | Discharge: 2015-03-06 | Disposition: A | Discharge status: Home / Self Care | Payer: Medicaid Other | Source: Home / Self Care | Attending: Emergency Medicine | Admitting: Emergency Medicine

## 2015-03-06 ENCOUNTER — Encounter: Payer: Self-pay | Admitting: Emergency Medicine

## 2015-03-06 ENCOUNTER — Emergency Department: Payer: Medicaid Other

## 2015-03-06 DIAGNOSIS — T361X5A Adverse effect of cephalosporins and other beta-lactam antibiotics, initial encounter: Secondary | ICD-10-CM | POA: Diagnosis not present

## 2015-03-06 DIAGNOSIS — E119 Type 2 diabetes mellitus without complications: Secondary | ICD-10-CM

## 2015-03-06 DIAGNOSIS — Z794 Long term (current) use of insulin: Secondary | ICD-10-CM | POA: Insufficient documentation

## 2015-03-06 DIAGNOSIS — Z8701 Personal history of pneumonia (recurrent): Secondary | ICD-10-CM

## 2015-03-06 DIAGNOSIS — G40909 Epilepsy, unspecified, not intractable, without status epilepticus: Secondary | ICD-10-CM | POA: Diagnosis present

## 2015-03-06 DIAGNOSIS — Z880 Allergy status to penicillin: Secondary | ICD-10-CM

## 2015-03-06 DIAGNOSIS — Z792 Long term (current) use of antibiotics: Secondary | ICD-10-CM | POA: Insufficient documentation

## 2015-03-06 DIAGNOSIS — J159 Unspecified bacterial pneumonia: Secondary | ICD-10-CM | POA: Insufficient documentation

## 2015-03-06 DIAGNOSIS — Z79899 Other long term (current) drug therapy: Secondary | ICD-10-CM

## 2015-03-06 DIAGNOSIS — Z7401 Bed confinement status: Secondary | ICD-10-CM

## 2015-03-06 DIAGNOSIS — Z930 Tracheostomy status: Secondary | ICD-10-CM

## 2015-03-06 DIAGNOSIS — M62838 Other muscle spasm: Secondary | ICD-10-CM | POA: Diagnosis present

## 2015-03-06 DIAGNOSIS — Y92239 Unspecified place in hospital as the place of occurrence of the external cause: Secondary | ICD-10-CM

## 2015-03-06 DIAGNOSIS — Z881 Allergy status to other antibiotic agents status: Secondary | ICD-10-CM

## 2015-03-06 DIAGNOSIS — Z87820 Personal history of traumatic brain injury: Secondary | ICD-10-CM

## 2015-03-06 DIAGNOSIS — S069X0S Unspecified intracranial injury without loss of consciousness, sequela: Secondary | ICD-10-CM

## 2015-03-06 DIAGNOSIS — D649 Anemia, unspecified: Secondary | ICD-10-CM | POA: Diagnosis present

## 2015-03-06 DIAGNOSIS — J9811 Atelectasis: Secondary | ICD-10-CM | POA: Diagnosis present

## 2015-03-06 DIAGNOSIS — L270 Generalized skin eruption due to drugs and medicaments taken internally: Secondary | ICD-10-CM | POA: Diagnosis not present

## 2015-03-06 DIAGNOSIS — G8250 Quadriplegia, unspecified: Secondary | ICD-10-CM | POA: Diagnosis present

## 2015-03-06 DIAGNOSIS — J156 Pneumonia due to other aerobic Gram-negative bacteria: Secondary | ICD-10-CM | POA: Diagnosis present

## 2015-03-06 DIAGNOSIS — Z882 Allergy status to sulfonamides status: Secondary | ICD-10-CM

## 2015-03-06 DIAGNOSIS — Z9911 Dependence on respirator [ventilator] status: Secondary | ICD-10-CM

## 2015-03-06 DIAGNOSIS — Z931 Gastrostomy status: Secondary | ICD-10-CM

## 2015-03-06 DIAGNOSIS — Z7951 Long term (current) use of inhaled steroids: Secondary | ICD-10-CM

## 2015-03-06 DIAGNOSIS — Z818 Family history of other mental and behavioral disorders: Secondary | ICD-10-CM

## 2015-03-06 DIAGNOSIS — E222 Syndrome of inappropriate secretion of antidiuretic hormone: Secondary | ICD-10-CM | POA: Diagnosis present

## 2015-03-06 DIAGNOSIS — J690 Pneumonitis due to inhalation of food and vomit: Secondary | ICD-10-CM | POA: Diagnosis present

## 2015-03-06 DIAGNOSIS — T17990A Other foreign object in respiratory tract, part unspecified in causing asphyxiation, initial encounter: Secondary | ICD-10-CM | POA: Diagnosis present

## 2015-03-06 DIAGNOSIS — Z833 Family history of diabetes mellitus: Secondary | ICD-10-CM

## 2015-03-06 DIAGNOSIS — A4159 Other Gram-negative sepsis: Principal | ICD-10-CM | POA: Diagnosis present

## 2015-03-06 DIAGNOSIS — J45909 Unspecified asthma, uncomplicated: Secondary | ICD-10-CM | POA: Diagnosis present

## 2015-03-06 DIAGNOSIS — J9621 Acute and chronic respiratory failure with hypoxia: Secondary | ICD-10-CM | POA: Diagnosis not present

## 2015-03-06 DIAGNOSIS — J189 Pneumonia, unspecified organism: Secondary | ICD-10-CM

## 2015-03-06 DIAGNOSIS — F419 Anxiety disorder, unspecified: Secondary | ICD-10-CM | POA: Diagnosis present

## 2015-03-06 DIAGNOSIS — Z8614 Personal history of Methicillin resistant Staphylococcus aureus infection: Secondary | ICD-10-CM

## 2015-03-06 DIAGNOSIS — J151 Pneumonia due to Pseudomonas: Secondary | ICD-10-CM | POA: Diagnosis present

## 2015-03-06 LAB — CBC
HEMATOCRIT: 37.8 % — AB (ref 40.0–52.0)
HEMOGLOBIN: 13.2 g/dL (ref 13.0–18.0)
MCH: 31.5 pg (ref 26.0–34.0)
MCHC: 34.9 g/dL (ref 32.0–36.0)
MCV: 90.4 fL (ref 80.0–100.0)
Platelets: 208 10*3/uL (ref 150–440)
RBC: 4.18 MIL/uL — AB (ref 4.40–5.90)
RDW: 14.1 % (ref 11.5–14.5)
WBC: 13.2 10*3/uL — AB (ref 3.8–10.6)

## 2015-03-06 LAB — BASIC METABOLIC PANEL
ANION GAP: 11 (ref 5–15)
BUN: <5 mg/dL — ABNORMAL LOW (ref 6–20)
CHLORIDE: 94 mmol/L — AB (ref 101–111)
CO2: 20 mmol/L — AB (ref 22–32)
Calcium: 8.8 mg/dL — ABNORMAL LOW (ref 8.9–10.3)
Creatinine, Ser: 0.37 mg/dL — ABNORMAL LOW (ref 0.61–1.24)
GFR calc non Af Amer: >60 mL/min (ref >60)
GLUCOSE: 134 mg/dL — AB (ref 65–99)
POTASSIUM: 3.7 mmol/L (ref 3.5–5.1)
Sodium: 125 mmol/L — ABNORMAL LOW (ref 135–145)

## 2015-03-06 MED ORDER — LEVOFLOXACIN IN D5W 750 MG/150ML IV SOLN
750.0000 mg | Freq: Once | INTRAVENOUS | Status: AC
  Administered 2015-03-06: 750 mg via INTRAVENOUS
  Filled 2015-03-06: qty 150

## 2015-03-06 MED ORDER — SODIUM CHLORIDE 0.9 % IV BOLUS (SEPSIS)
500.0000 mL | Freq: Once | INTRAVENOUS | Status: AC
  Administered 2015-03-06: 500 mL via INTRAVENOUS

## 2015-03-06 MED ORDER — LEVOFLOXACIN 750 MG PO TABS: 750.0000 mg | ORAL_TABLET | Freq: Every day | ORAL | Status: AC

## 2015-03-06 NOTE — ED Notes (Signed)
Patient suctioned at this time per family request. Small amount of bloody secretions noted. Pt tolerated well. 2 RN's present at this time during procedure.

## 2015-03-06 NOTE — ED Notes (Signed)
Pt arrived via EMS from home with family.  Mother reports patient was seen by a home health agency on Monday and they were changing his clothes and pulled out the trach.  Mother states that he has had trouble coming off the vent this week. A few days ago, red blood has been seen with suctioning which is concerning to family and other home health agency.

## 2015-03-06 NOTE — Discharge Instructions (Signed)
As we have discussed your workup is most consistent with pneumonia. Please take your antibiotics as prescribed for their entire course. Please follow-up with your primary care doctor on Monday for recheck. Return to the emergency department immediately for any worsening cough, congestion, fever, or any trouble breathing.  Pneumonia, Adult Pneumonia is an infection of the lungs. It may be caused by a germ (virus or bacteria). Some types of pneumonia can spread easily from person to person. This can happen when you cough or sneeze. HOME CARE  Only take medicine as told by your doctor.  Take your medicine (antibiotics) as told. Finish it even if you start to feel better.  Do not smoke.  You may use a vaporizer or humidifier in your room. This can help loosen thick spit (mucus).  Sleep so you are almost sitting up (semi-upright). This helps reduce coughing.  Rest. A shot (vaccine) can help prevent pneumonia. Shots are often advised for:  People over 11 years old.  Patients on chemotherapy.  People with long-term (chronic) lung problems.  People with immune system problems. GET HELP RIGHT AWAY IF:   You are getting worse.  You cannot control your cough, and you are losing sleep.  You cough up blood.  Your pain gets worse, even with medicine.  You have a fever.  Any of your problems are getting worse, not better.  You have shortness of breath or chest pain. MAKE SURE YOU:   Understand these instructions.  Will watch your condition.  Will get help right away if you are not doing well or get worse. Document Released: 12/13/2007 Document Revised: 09/18/2011 Document Reviewed: 09/16/2010 [facility] Patient Information 2015 [place], [place]. This information is not intended to replace advice given to you by your health care provider. Make sure you discuss any questions you have with your health care provider.

## 2015-03-06 NOTE — ED Provider Notes (Signed)
[facility] Emergency Department Provider Note  Time seen: 10:02 AM  I have reviewed the triage vital signs and the nursing notes.   HISTORY  Chief Complaint Shortness of Breath    HPI Roger Tate is a 24 y.o. male with a past medical history of brain surgery, C-section, diabetes, seizure disorder, tracheostomy, who presents the emergency department with occasional bloody sputum as well as increased sputum production. According to mom 5 days ago the home health nurse while changing the patient's closed accidentally disrupted his tracheostomy causing a mild amount of bleeding around the tracheostomy site. Mom states this has continued for the past 5 days with intermittent bloody sputum in the morning, and then it clears by the afternoon. Also states cough with increased sputum production, and the patient has complained to her of soreness around the tracheostomy and throat. Mom denies any fever.    Past Medical History  Diagnosis Date  . Brain injury 2002  . Diabetes mellitus without complication   . Seizures   . Asthma     Patient Active Problem List   Diagnosis Date Noted  . Acute on chronic respiratory failure 01/14/2015  . Left lower lobe pneumonia 01/14/2015  . Hypotension 01/14/2015  . Sepsis 01/14/2015  . S/P ventriculoperitoneal shunt 01/14/2015  . H/O tracheostomy 01/14/2015  . S/P percutaneous endoscopic gastrostomy (PEG) tube placement 01/14/2015  . Scoliosis 01/14/2015  . 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 Brain surgery  2002  . Tracheostomy tube placement    . Sp perc place gastric tube    . Csf shunt      Current Outpatient Rx  Name  Route  Sig  Dispense  Refill  . albuterol (PROVENTIL) (2.5 MG/3ML) 0.083% nebulizer solution   Inhalation   Inhale 3 mLs into the lungs 4 (four) times daily as needed.         . Ascorbic Acid (VITAMIN C) 1000 MG tablet   Oral   Take 1  tablet by mouth daily.         . baclofen (LIORESAL) 10 MG tablet   Oral   Take 1 tablet by mouth 2 (two) times daily.         . bisacodyl (LAXATIVE) 10 MG suppository   Rectal   Place 1 suppository rectally at bedtime as needed.         . budesonide (PULMICORT) 0.25 MG/2ML nebulizer solution   Inhalation   Inhale 2 mLs into the lungs every 12 (twelve) hours.         . calcium carbonate (TUMS EX) 750 MG chewable tablet   Oral   Chew 1 tablet by mouth daily.         . cefUROXime (CEFTIN) 500 MG tablet   Oral   Take 1 tablet (500 mg total) by mouth 2 (two) times daily with a meal.   10 tablet   0   . cetirizine (ZYRTEC) 10 MG tablet   Oral   Take 10 mg by mouth as needed for allergies.         . clonazePAM (KLONOPIN) 1 MG tablet   Oral   Take 1 mg by mouth as needed for anxiety.         . Dextromethorphan-Guaifenesin (GUAIFENESIN DM) 400-20 MG TABS   Oral   Take 1 tablet by mouth every 12 (twelve) hours.         . diphenhydrAMINE (BENADRYL) 25  mg capsule   Oral   Take 1 tablet by mouth daily.         Marland Kitchen EPINEPHrine (EPIPEN JR) 0.15 MG/0.3ML injection   Intramuscular   Inject 0.15 mg into the muscle as needed.         Marland Kitchen FIBER PO   Oral   Take 1 Dose by mouth daily.         Marland Kitchen glucagon (GLUCAGON EMERGENCY) 1 MG injection   Intramuscular   Inject 1 mg into the muscle as needed.         . insulin aspart (NOVOLOG) 100 UNIT/ML injection   Subcutaneous   Inject 2 Units into the skin as needed for high blood sugar. Prn before meals for blood glucose greater than 200         . ketoconazole (NIZORAL) 2 % cream   Topical   Apply 1 application topically daily.         Marland Kitchen lactulose (CHRONULAC) 10 GM/15ML solution   Oral   Take 30 mLs by mouth 2 (two) times daily.         Marland Kitchen levETIRAcetam (KEPPRA) 250 MG tablet   Oral   Take 2 tablets (500 mg total) by mouth 2 (two) times daily.   60 tablet   0   . linezolid (ZYVOX) 600 MG tablet    Oral   Take 1 tablet (600 mg total) by mouth 2 (two) times daily.   10 tablet   0   . neomycin-bacitracin-polymyxin (NEOSPORIN) ophthalmic ointment      1 application daily.         Marland Kitchen nystatin ointment (MYCOSTATIN)      1 application daily.         . ranitidine (ZANTAC) 75 MG/5ML syrup   Oral   Take 10 mLs by mouth 2 (two) times daily.      4   . valproic acid (DEPAKENE) 250 MG capsule   Oral   Take 500 mg by mouth 2 (two) times daily.         Marland Kitchen valproic acid (DEPAKENE) 250 MG capsule   Oral   Take 750 mg by mouth at bedtime.           Allergies Sulfa antibiotics; Zosyn; Vancomycin; and Penicillins  History reviewed. No pertinent family history.  Social History Social History  Substance Use Topics  . Smoking status: Never Smoker   . Smokeless tobacco: None  . Alcohol Use: No    Review of Systems Constitutional: Negative for fever Cardiovascular: Negative for chest pain. Respiratory: Positive for intermittent shortness of breath. Increased cough and sputum. Gastrointestinal: Negative for abdominal pain Skin: Negative for rash. No erythema surrounding tracheostomy site. Neurological: Negative for headache 10-point ROS otherwise negative.  ____________________________________________   PHYSICAL EXAM:  VITAL SIGNS: ED Triage Vitals  Enc Vitals Group     BP 03/06/15 0945 103/62 mmHg     Pulse Rate 03/06/15 0945 92     Resp 03/06/15 0945 16     Temp 03/06/15 0945 97.6 F (36.4 C)     Temp Source 03/06/15 0945 Axillary     SpO2 03/06/15 0945 94 %     Weight 03/06/15 0945 115 lb 11.9 oz (52.5 kg)     Height --      Head Cir --      Peak Flow --      Pain Score 03/06/15 0947 0     Pain Loc --  Pain Edu? --      Excl. in GC? --     Constitutional: Alert and oriented. Well appearing and in no distress. Eyes: Normal exam ENT   Mouth/Throat: Mucous membranes are moist. No oral findings noted. Tracheostomy site appears well, no bleeding  noted. No blood within the trach currently. Cardiovascular: Normal rate, regular rhythm. No murmur Respiratory: Normal respiratory effort without tachypnea nor retractions. Patient does have lung sounds consistent with secretions bilaterally, moderate rhonchi bilaterally, but more so on the right side. Gastrointestinal: Soft and nontender. No distention.   Musculoskeletal: Nontender with normal range of motion in all extremities. Neurologic:  Largely at baseline besides the patient does not want to speak per mom. Skin:  Skin is warm, dry and intact.  Psychiatric: Mood and affect are normal.  ____________________________________________     RADIOLOGY  X-ray consistent with left-sided atelectasis versus pneumonia.  ____________________________________________    INITIAL IMPRESSION / ASSESSMENT AND PLAN / ED COURSE  Pertinent labs & imaging results that were available during my care of the patient were reviewed by me and considered in my medical decision making (see chart for details).  Patient with increased cough, sputum, and occasional bloody secretions from his tracheostomy. Mom denies any fever. We'll check labs, chest x-ray, and closely monitor in the emergency department. Respiratory therapy will be down to suction the patient.  X-ray consistent with left-sided atelectasis versus pneumonia. The patient had an elevated white blood cell count of 13, and a low sodium of 125. I discussed these results with the patient's stepmother as well as the patient's father. They strongly wish for the patient to go home, even after I have recommended admission. They were not happy with the care the patient received last time after being admitted to the hospital, and would rather do a trial of home treatment with follow-up with his doctor on Monday. The father states that he will very closely monitor the patient and if anything worsens or changes he'll return the patient immediately to the emergency  department. Given his low sodium, we will dose the patient with 500 cc of normal saline in the emergency department, and begin a dose of IV Levaquin. The patient will then be discharged on Levaquin, with primary care follow-up on Monday. Patient's vitals otherwise remained within normal limits including his temperature.  ____________________________________________   FINAL CLINICAL IMPRESSION(S) / ED DIAGNOSES  Pneumonia   Minna Antis, MD 03/06/15 1444

## 2015-03-06 NOTE — Progress Notes (Signed)
This note also relates to the following rows which could not be included: Pulse Rate - Cannot attach notes to unvalidated device data SpO2 - Cannot attach notes to unvalidated device data     03/06/15 1035  Respiratory Assessment  Respiratory Pattern Regular  Chest Assessment Chest expansion symmetrical  Cough Non-productive;Weak  Sputum How Obtained (trach)  Bilateral Breath Sounds Rhonchi  Tracheostomy Bivona Fome-Cuf 7 mm Cuffed  No Placement Date or Time found.   Inserted prior to hospital arrival?: Yes  Brand: Bivona Fome-Cuf  Size (mm): 7 mm  Style: Cuffed  Status Secured  Site Assessment Dry  Ties Assessment Secure  Respiratory  Airway LDA Tracheostomy  Suctioned patient through tracheostomy revealing blood tinged secretions.  Patients mother states that in home nursing agency had caused trauma due to frequent suctioning of tracheostomy.

## 2015-03-06 NOTE — Progress Notes (Signed)
Spoke with patients step-mother and in home nurse about home ventilator settings, medications, and therapies.  This patient is placed on the ventilator at night with the following settings:  SIMV PS 13, Peep 5, RR 10, inspiratory time 1.0, high alarm 50, low alarm 5.  Fi02 is tirated between 1-5lmp per patient demand; however, there is no mention of Vt.  Ventilator used is [id number].  The patient also uses chest vest q4 for secretion mobilization and Pulmicort nebulizer q12.  The patients trach tube is a bivona size 7 which has a cuff that requires 3-31ml of sterile or distilled water.

## 2015-03-06 NOTE — ED Notes (Signed)
Suctioned at family's request.  Minimal amount of secretions with blood tinge removed.

## 2015-03-06 NOTE — ED Notes (Signed)
Per Mother, patient was due to see Dr. Willeen Cass with ENT on Wednesday this passed week, but was unable to go to appointment because of transportation needs.

## 2015-03-07 ENCOUNTER — Encounter: Payer: Self-pay | Admitting: Emergency Medicine

## 2015-03-07 ENCOUNTER — Inpatient Hospital Stay
Admission: EM | Admit: 2015-03-07 | Discharge: 2015-03-18 | DRG: 853 | Disposition: A | Discharge status: Home / Self Care | Payer: Medicaid Other | Attending: Internal Medicine | Admitting: Internal Medicine

## 2015-03-07 DIAGNOSIS — J156 Pneumonia due to other aerobic Gram-negative bacteria: Secondary | ICD-10-CM | POA: Diagnosis present

## 2015-03-07 DIAGNOSIS — J988 Other specified respiratory disorders: Secondary | ICD-10-CM

## 2015-03-07 DIAGNOSIS — J9601 Acute respiratory failure with hypoxia: Secondary | ICD-10-CM | POA: Diagnosis not present

## 2015-03-07 DIAGNOSIS — Z882 Allergy status to sulfonamides status: Secondary | ICD-10-CM | POA: Diagnosis not present

## 2015-03-07 DIAGNOSIS — J151 Pneumonia due to Pseudomonas: Secondary | ICD-10-CM | POA: Diagnosis present

## 2015-03-07 DIAGNOSIS — Z930 Tracheostomy status: Secondary | ICD-10-CM | POA: Diagnosis not present

## 2015-03-07 DIAGNOSIS — Z8614 Personal history of Methicillin resistant Staphylococcus aureus infection: Secondary | ICD-10-CM | POA: Diagnosis not present

## 2015-03-07 DIAGNOSIS — Z931 Gastrostomy status: Secondary | ICD-10-CM | POA: Diagnosis not present

## 2015-03-07 DIAGNOSIS — J9620 Acute and chronic respiratory failure, unspecified whether with hypoxia or hypercapnia: Secondary | ICD-10-CM | POA: Diagnosis not present

## 2015-03-07 DIAGNOSIS — Z9911 Dependence on respirator [ventilator] status: Secondary | ICD-10-CM | POA: Diagnosis not present

## 2015-03-07 DIAGNOSIS — J9600 Acute respiratory failure, unspecified whether with hypoxia or hypercapnia: Secondary | ICD-10-CM | POA: Diagnosis not present

## 2015-03-07 DIAGNOSIS — Z87820 Personal history of traumatic brain injury: Secondary | ICD-10-CM | POA: Diagnosis not present

## 2015-03-07 DIAGNOSIS — R509 Fever, unspecified: Secondary | ICD-10-CM

## 2015-03-07 DIAGNOSIS — J9811 Atelectasis: Secondary | ICD-10-CM | POA: Diagnosis present

## 2015-03-07 DIAGNOSIS — A4159 Other Gram-negative sepsis: Secondary | ICD-10-CM | POA: Diagnosis present

## 2015-03-07 DIAGNOSIS — L270 Generalized skin eruption due to drugs and medicaments taken internally: Secondary | ICD-10-CM | POA: Diagnosis not present

## 2015-03-07 DIAGNOSIS — D649 Anemia, unspecified: Secondary | ICD-10-CM | POA: Diagnosis present

## 2015-03-07 DIAGNOSIS — Z818 Family history of other mental and behavioral disorders: Secondary | ICD-10-CM | POA: Diagnosis not present

## 2015-03-07 DIAGNOSIS — J189 Pneumonia, unspecified organism: Secondary | ICD-10-CM | POA: Diagnosis not present

## 2015-03-07 DIAGNOSIS — J9621 Acute and chronic respiratory failure with hypoxia: Secondary | ICD-10-CM | POA: Diagnosis not present

## 2015-03-07 DIAGNOSIS — R000 Tachycardia, unspecified: Secondary | ICD-10-CM

## 2015-03-07 DIAGNOSIS — J677 Air conditioner and humidifier lung: Secondary | ICD-10-CM

## 2015-03-07 DIAGNOSIS — G40909 Epilepsy, unspecified, not intractable, without status epilepticus: Secondary | ICD-10-CM | POA: Diagnosis present

## 2015-03-07 DIAGNOSIS — J181 Lobar pneumonia, unspecified organism: Secondary | ICD-10-CM

## 2015-03-07 DIAGNOSIS — J690 Pneumonitis due to inhalation of food and vomit: Secondary | ICD-10-CM | POA: Diagnosis present

## 2015-03-07 DIAGNOSIS — Z79899 Other long term (current) drug therapy: Secondary | ICD-10-CM | POA: Diagnosis not present

## 2015-03-07 DIAGNOSIS — Z833 Family history of diabetes mellitus: Secondary | ICD-10-CM | POA: Diagnosis not present

## 2015-03-07 DIAGNOSIS — M62838 Other muscle spasm: Secondary | ICD-10-CM | POA: Diagnosis present

## 2015-03-07 DIAGNOSIS — Z880 Allergy status to penicillin: Secondary | ICD-10-CM | POA: Diagnosis not present

## 2015-03-07 DIAGNOSIS — R0600 Dyspnea, unspecified: Secondary | ICD-10-CM

## 2015-03-07 DIAGNOSIS — Y92239 Unspecified place in hospital as the place of occurrence of the external cause: Secondary | ICD-10-CM | POA: Diagnosis not present

## 2015-03-07 DIAGNOSIS — J9819 Other pulmonary collapse: Secondary | ICD-10-CM | POA: Diagnosis not present

## 2015-03-07 DIAGNOSIS — R7881 Bacteremia: Secondary | ICD-10-CM

## 2015-03-07 DIAGNOSIS — J9690 Respiratory failure, unspecified, unspecified whether with hypoxia or hypercapnia: Secondary | ICD-10-CM

## 2015-03-07 DIAGNOSIS — E119 Type 2 diabetes mellitus without complications: Secondary | ICD-10-CM | POA: Diagnosis present

## 2015-03-07 DIAGNOSIS — Z8701 Personal history of pneumonia (recurrent): Secondary | ICD-10-CM | POA: Diagnosis not present

## 2015-03-07 DIAGNOSIS — F419 Anxiety disorder, unspecified: Secondary | ICD-10-CM | POA: Diagnosis present

## 2015-03-07 DIAGNOSIS — Z881 Allergy status to other antibiotic agents status: Secondary | ICD-10-CM | POA: Diagnosis not present

## 2015-03-07 DIAGNOSIS — T361X5A Adverse effect of cephalosporins and other beta-lactam antibiotics, initial encounter: Secondary | ICD-10-CM | POA: Diagnosis not present

## 2015-03-07 DIAGNOSIS — E222 Syndrome of inappropriate secretion of antidiuretic hormone: Secondary | ICD-10-CM | POA: Diagnosis present

## 2015-03-07 DIAGNOSIS — J45909 Unspecified asthma, uncomplicated: Secondary | ICD-10-CM | POA: Diagnosis present

## 2015-03-07 DIAGNOSIS — Z7401 Bed confinement status: Secondary | ICD-10-CM | POA: Diagnosis not present

## 2015-03-07 DIAGNOSIS — G8250 Quadriplegia, unspecified: Secondary | ICD-10-CM | POA: Diagnosis present

## 2015-03-07 DIAGNOSIS — Z794 Long term (current) use of insulin: Secondary | ICD-10-CM | POA: Diagnosis not present

## 2015-03-07 DIAGNOSIS — T17990A Other foreign object in respiratory tract, part unspecified in causing asphyxiation, initial encounter: Secondary | ICD-10-CM | POA: Diagnosis present

## 2015-03-07 DIAGNOSIS — S069X0S Unspecified intracranial injury without loss of consciousness, sequela: Secondary | ICD-10-CM | POA: Diagnosis not present

## 2015-03-07 LAB — URINALYSIS COMPLETE WITH MICROSCOPIC (ARMC ONLY)
Bacteria, UA: NONE SEEN
Bilirubin Urine: NEGATIVE
Glucose, UA: NEGATIVE mg/dL
Hgb urine dipstick: NEGATIVE
Ketones, ur: NEGATIVE mg/dL
Leukocytes, UA: NEGATIVE
Nitrite: NEGATIVE
Protein, ur: NEGATIVE mg/dL
Specific Gravity, Urine: 1.012 (ref 1.005–1.030)
Squamous Epithelial / HPF: NONE SEEN
pH: 6 (ref 5.0–8.0)

## 2015-03-07 LAB — BASIC METABOLIC PANEL
Anion gap: 9 (ref 5–15)
BUN: <5 mg/dL — ABNORMAL LOW (ref 6–20)
CO2: 22 mmol/L (ref 22–32)
Calcium: 9.1 mg/dL (ref 8.9–10.3)
Chloride: 100 mmol/L — ABNORMAL LOW (ref 101–111)
Creatinine, Ser: 0.45 mg/dL — ABNORMAL LOW (ref 0.61–1.24)
GFR calc Af Amer: >60 mL/min (ref >60)
GFR calc non Af Amer: >60 mL/min (ref >60)
Glucose, Bld: 128 mg/dL — ABNORMAL HIGH (ref 65–99)
Potassium: 4.3 mmol/L (ref 3.5–5.1)
Sodium: 131 mmol/L — ABNORMAL LOW (ref 135–145)

## 2015-03-07 LAB — CBC
HCT: 36.4 % — ABNORMAL LOW (ref 40.0–52.0)
Hemoglobin: 12.8 g/dL — ABNORMAL LOW (ref 13.0–18.0)
MCH: 32.1 pg (ref 26.0–34.0)
MCHC: 35.1 g/dL (ref 32.0–36.0)
MCV: 91.4 fL (ref 80.0–100.0)
Platelets: 203 10*3/uL (ref 150–440)
RBC: 3.98 MIL/uL — ABNORMAL LOW (ref 4.40–5.90)
RDW: 13.8 % (ref 11.5–14.5)
WBC: 12.2 10*3/uL — ABNORMAL HIGH (ref 3.8–10.6)

## 2015-03-07 LAB — MRSA PCR SCREENING: MRSA by PCR: NEGATIVE

## 2015-03-07 MED ORDER — VITAMIN C 500 MG PO TABS
250.0000 mg | ORAL_TABLET | Freq: Every day | ORAL | Status: DC
  Administered 2015-03-08 – 2015-03-18 (×10): 250 mg via ORAL
  Filled 2015-03-07 (×2): qty 1
  Filled 2015-03-07: qty 2
  Filled 2015-03-07: qty 0.5
  Filled 2015-03-07 (×3): qty 1
  Filled 2015-03-07: qty 0.5
  Filled 2015-03-07: qty 2
  Filled 2015-03-07: qty 1
  Filled 2015-03-07: qty 2

## 2015-03-07 MED ORDER — CALCIUM CARBONATE ANTACID 500 MG PO CHEW
1.0000 | CHEWABLE_TABLET | Freq: Every day | ORAL | Status: DC
  Administered 2015-03-10 – 2015-03-17 (×8): 200 mg via ORAL
  Filled 2015-03-07 (×12): qty 1

## 2015-03-07 MED ORDER — ANTISEPTIC ORAL RINSE SOLUTION (CORINZ)
7.0000 mL | Freq: Four times a day (QID) | OROMUCOSAL | Status: DC
  Administered 2015-03-08 – 2015-03-18 (×36): 7 mL via OROMUCOSAL
  Filled 2015-03-07 (×46): qty 7

## 2015-03-07 MED ORDER — BACLOFEN 10 MG PO TABS
10.0000 mg | ORAL_TABLET | Freq: Two times a day (BID) | ORAL | Status: DC
  Administered 2015-03-08 – 2015-03-09 (×3): 10 mg via ORAL
  Filled 2015-03-07 (×4): qty 1

## 2015-03-07 MED ORDER — LEVOFLOXACIN IN D5W 750 MG/150ML IV SOLN: 750.0000 mg | Freq: Once | INTRAVENOUS | Status: DC

## 2015-03-07 MED ORDER — KETOCONAZOLE 2 % EX CREA
1.0000 "application " | TOPICAL_CREAM | Freq: Two times a day (BID) | CUTANEOUS | Status: DC | PRN
  Filled 2015-03-07: qty 15

## 2015-03-07 MED ORDER — ACETAMINOPHEN 650 MG RE SUPP: 650.0000 mg | Freq: Four times a day (QID) | RECTAL | Status: DC | PRN

## 2015-03-07 MED ORDER — GUAIFENESIN-DM 100-10 MG/5ML PO SYRP
10.0000 mL | ORAL_SOLUTION | Freq: Two times a day (BID) | ORAL | Status: DC
  Administered 2015-03-08 – 2015-03-09 (×3): 10 mL via ORAL
  Filled 2015-03-07 (×4): qty 10

## 2015-03-07 MED ORDER — RANITIDINE HCL 150 MG/10ML PO SYRP
150.0000 mg | ORAL_SOLUTION | Freq: Once | ORAL | Status: AC
  Administered 2015-03-07: 150 mg via ORAL
  Filled 2015-03-07: qty 10

## 2015-03-07 MED ORDER — VALPROIC ACID 250 MG/5ML PO SYRP
500.0000 mg | ORAL_SOLUTION | ORAL | Status: DC
Start: 2015-03-08 — End: 2015-03-07

## 2015-03-07 MED ORDER — VALPROIC ACID 250 MG/5ML PO SYRP: 500.0000 mg | ORAL_SOLUTION | ORAL | Status: DC

## 2015-03-07 MED ORDER — BISACODYL 10 MG RE SUPP: 10.0000 mg | Freq: Every evening | RECTAL | Status: DC | PRN

## 2015-03-07 MED ORDER — DEXTROSE 5 % IV SOLN
250.0000 mg | INTRAVENOUS | Status: DC
  Administered 2015-03-07 – 2015-03-08 (×2): 250 mg via INTRAVENOUS
  Filled 2015-03-07 (×3): qty 250

## 2015-03-07 MED ORDER — LACTULOSE 10 GM/15ML PO SOLN
20.0000 g | Freq: Two times a day (BID) | ORAL | Status: DC
  Administered 2015-03-08 – 2015-03-09 (×3): 20 g via ORAL
  Filled 2015-03-07 (×4): qty 30

## 2015-03-07 MED ORDER — DEXTROSE 5 % IV SOLN
1.0000 g | Freq: Once | INTRAVENOUS | Status: AC
  Administered 2015-03-07: 1 g via INTRAVENOUS
  Filled 2015-03-07: qty 10

## 2015-03-07 MED ORDER — ACETAMINOPHEN 325 MG PO TABS
650.0000 mg | ORAL_TABLET | Freq: Four times a day (QID) | ORAL | Status: DC | PRN
  Administered 2015-03-10 – 2015-03-18 (×10): 650 mg via ORAL
  Filled 2015-03-07 (×10): qty 2

## 2015-03-07 MED ORDER — CLONAZEPAM 0.5 MG PO TABS: 1.0000 mg | ORAL_TABLET | ORAL | Status: DC | PRN

## 2015-03-07 MED ORDER — BACLOFEN 10 MG PO TABS
10.0000 mg | ORAL_TABLET | Freq: Once | ORAL | Status: AC
  Administered 2015-03-07: 10 mg via ORAL
  Filled 2015-03-07: qty 1

## 2015-03-07 MED ORDER — ALBUTEROL SULFATE (2.5 MG/3ML) 0.083% IN NEBU
3.0000 mL | INHALATION_SOLUTION | RESPIRATORY_TRACT | Status: DC | PRN
  Administered 2015-03-09: 3 mL via RESPIRATORY_TRACT
  Filled 2015-03-07: qty 3

## 2015-03-07 MED ORDER — ONDANSETRON HCL 4 MG PO TABS: 4.0000 mg | ORAL_TABLET | Freq: Four times a day (QID) | ORAL | Status: DC | PRN

## 2015-03-07 MED ORDER — DEXTROSE 5 % IV SOLN
2.0000 g | Freq: Three times a day (TID) | INTRAVENOUS | Status: DC
  Administered 2015-03-07 – 2015-03-09 (×5): 2 g via INTRAVENOUS
  Filled 2015-03-07 (×9): qty 2

## 2015-03-07 MED ORDER — CLOTRIMAZOLE 1 % EX CREA
TOPICAL_CREAM | Freq: Two times a day (BID) | CUTANEOUS | Status: DC
  Administered 2015-03-08 – 2015-03-18 (×21): via TOPICAL
  Filled 2015-03-07: qty 15

## 2015-03-07 MED ORDER — VALPROIC ACID 250 MG/5ML PO SYRP
750.0000 mg | ORAL_SOLUTION | ORAL | Status: DC
  Administered 2015-03-07 – 2015-03-09 (×3): 750 mg
  Filled 2015-03-07 (×5): qty 15

## 2015-03-07 MED ORDER — NYSTATIN 100000 UNIT/GM EX CREA
1.0000 "application " | TOPICAL_CREAM | Freq: Three times a day (TID) | CUTANEOUS | Status: DC
  Administered 2015-03-08 – 2015-03-18 (×31): 1 via TOPICAL
  Filled 2015-03-07 (×2): qty 15

## 2015-03-07 MED ORDER — ONDANSETRON HCL 4 MG/2ML IJ SOLN
4.0000 mg | Freq: Four times a day (QID) | INTRAMUSCULAR | Status: DC | PRN
  Administered 2015-03-11 (×2): 4 mg via INTRAVENOUS
  Filled 2015-03-07 (×2): qty 2

## 2015-03-07 MED ORDER — VALPROIC ACID 250 MG/5ML PO SYRP
500.0000 mg | ORAL_SOLUTION | Freq: Once | ORAL | Status: AC
  Administered 2015-03-07: 500 mg
  Filled 2015-03-07: qty 10

## 2015-03-07 MED ORDER — CHLORHEXIDINE GLUCONATE 0.12% ORAL RINSE (MEDLINE KIT)
15.0000 mL | Freq: Two times a day (BID) | OROMUCOSAL | Status: DC
  Administered 2015-03-07 – 2015-03-18 (×21): 15 mL via OROMUCOSAL
  Filled 2015-03-07 (×24): qty 15

## 2015-03-07 MED ORDER — INSULIN ASPART 100 UNIT/ML ~~LOC~~ SOLN: 2.0000 [IU] | SUBCUTANEOUS | Status: DC | PRN

## 2015-03-07 MED ORDER — DIPHENHYDRAMINE HCL 50 MG/ML IJ SOLN
25.0000 mg | Freq: Once | INTRAMUSCULAR | Status: AC
  Administered 2015-03-07: 25 mg via INTRAVENOUS
  Filled 2015-03-07: qty 1

## 2015-03-07 MED ORDER — LORATADINE 10 MG PO TABS
10.0000 mg | ORAL_TABLET | Freq: Every day | ORAL | Status: DC
  Administered 2015-03-08 – 2015-03-17 (×10): 10 mg via ORAL
  Filled 2015-03-07 (×13): qty 1

## 2015-03-07 MED ORDER — SODIUM CHLORIDE 0.9 % IV BOLUS (SEPSIS)
250.0000 mL | Freq: Once | INTRAVENOUS | Status: AC
  Administered 2015-03-07: 250 mL via INTRAVENOUS

## 2015-03-07 MED ORDER — LACTULOSE 10 GM/15ML PO SOLN
20.0000 g | Freq: Once | ORAL | Status: AC
  Administered 2015-03-07: 20 g via ORAL
  Filled 2015-03-07: qty 30

## 2015-03-07 MED ORDER — LEVETIRACETAM 100 MG/ML PO SOLN
2250.0000 mg | Freq: Once | ORAL | Status: AC
  Administered 2015-03-07: 2250 mg
  Filled 2015-03-07: qty 22.5

## 2015-03-07 MED ORDER — VALPROIC ACID 250 MG/5ML PO SYRP
500.0000 mg | ORAL_SOLUTION | ORAL | Status: DC
  Administered 2015-03-08 – 2015-03-10 (×3): 500 mg
  Filled 2015-03-07 (×6): qty 10

## 2015-03-07 MED ORDER — RANITIDINE HCL 150 MG/10ML PO SYRP
150.0000 mg | ORAL_SOLUTION | Freq: Two times a day (BID) | ORAL | Status: DC
  Administered 2015-03-08 – 2015-03-09 (×3): 150 mg via ORAL
  Filled 2015-03-07 (×8): qty 10

## 2015-03-07 MED ORDER — VALPROIC ACID 250 MG/5ML PO SYRP
500.0000 mg | ORAL_SOLUTION | ORAL | Status: DC
  Administered 2015-03-08 – 2015-03-09 (×2): 500 mg
  Filled 2015-03-07 (×3): qty 10

## 2015-03-07 MED ORDER — LEVETIRACETAM 100 MG/ML PO SOLN
2250.0000 mg | Freq: Two times a day (BID) | ORAL | Status: DC
  Administered 2015-03-08 – 2015-03-10 (×5): 2250 mg
  Filled 2015-03-07 (×17): qty 22.5

## 2015-03-07 MED ORDER — DIPHENHYDRAMINE HCL 25 MG PO CAPS
25.0000 mg | ORAL_CAPSULE | Freq: Every day | ORAL | Status: DC | PRN
  Administered 2015-03-13 – 2015-03-17 (×3): 25 mg via ORAL
  Filled 2015-03-07 (×4): qty 1

## 2015-03-07 MED ORDER — BUDESONIDE 0.5 MG/2ML IN SUSP
0.5000 mg | Freq: Two times a day (BID) | RESPIRATORY_TRACT | Status: DC
  Administered 2015-03-07 – 2015-03-13 (×12): 0.5 mg via RESPIRATORY_TRACT
  Filled 2015-03-07 (×15): qty 2

## 2015-03-07 MED ORDER — ENOXAPARIN SODIUM 40 MG/0.4ML ~~LOC~~ SOLN
40.0000 mg | SUBCUTANEOUS | Status: DC
  Administered 2015-03-07 – 2015-03-17 (×11): 40 mg via SUBCUTANEOUS
  Filled 2015-03-07 (×11): qty 0.4

## 2015-03-07 MED ORDER — ADULT MULTIVITAMIN W/MINERALS CH
ORAL_TABLET | Freq: Every day | ORAL | Status: DC
  Administered 2015-03-08 – 2015-03-18 (×11): 1 via ORAL
  Filled 2015-03-07 (×12): qty 1

## 2015-03-07 NOTE — H&P (Signed)
[facility] - [place] at [facility]   PATIENT NAME: Roger Tate    MR#:  [id number]  DATE OF BIRTH:  [birth year]  DATE OF ADMISSION:  03/07/2015  PRIMARY CARE PHYSICIAN: Dorothey Baseman, MD   REQUESTING/REFERRING PHYSICIAN: Dr. Minna Antis  CHIEF COMPLAINT:   Chief Complaint  Patient presents with  . Blood Infection   bacteremia  HISTORY OF PRESENT ILLNESS:  Roger Tate  is a 24 y.o. male with a known history of traumatic brain injury, diabetes, seizures, asthma, status post PEG, status post tracheostomy who presents to the hospital as he was noted to have positive blood cultures. Patient apparently was brought to the emergency room yesterday as he was having some bloody tracheal secretions. Patient was seen in the ER and diagnosed with a pneumonia and discharged home yesterday on oral Levaquin. Today the blood cultures from yesterday were known to be positive with gram-negative rod and the patient's family was called to bring the patient back to the emergency room. As per the patient's parents he has not had any fever, nausea, vomiting, abdominal pain, diarrhea. He's only had some bloody tracheal secretions which they attribute to some trauma to the tracheostomy by the home health nurse. Given his bacteremia hospitalist services were contacted further treatment and evaluation.  PAST MEDICAL HISTORY:   Past Medical History  Diagnosis Date  . Brain injury 2002  . Diabetes mellitus without complication   . Seizures   . Asthma     PAST SURGICAL HISTORY:   Past Surgical History  Procedure Laterality Date  . Brain surgery  2002  . Tracheostomy tube placement    . Sp perc place gastric tube    . Csf shunt      SOCIAL HISTORY:   Social History  Substance Use Topics  . Smoking status: Never Smoker   . Smokeless tobacco: Not on file  . Alcohol Use: No    FAMILY HISTORY:   Family History  Problem Relation Age of Onset  . Bipolar disorder  Mother   . Diabetes Father     DRUG ALLERGIES:   Allergies  Allergen Reactions  . Sulfa Antibiotics Anaphylaxis  . Zosyn [Piperacillin-Tazobactam In Dex] Anaphylaxis  . Ciprofloxacin Other (See Comments)    HYPOTENSION  . Vancomycin   . Penicillins Rash    REVIEW OF SYSTEMS:   Review of Systems  Constitutional: Negative for fever and weight loss.  HENT: Negative for congestion, nosebleeds and tinnitus.   Eyes: Negative for blurred vision, double vision and redness.  Respiratory: Negative for cough, hemoptysis and shortness of breath.   Cardiovascular: Negative for chest pain, orthopnea, leg swelling and PND.  Gastrointestinal: Negative for nausea, vomiting, abdominal pain, diarrhea and melena.  Genitourinary: Negative for dysuria, urgency and hematuria.  Musculoskeletal: Negative for joint pain and falls.  Neurological: Negative for dizziness, tingling, sensory change, focal weakness, seizures, weakness and headaches.  Endo/Heme/Allergies: Negative for polydipsia. Does not bruise/bleed easily.  Psychiatric/Behavioral: Negative for depression and memory loss. The patient is not nervous/anxious.     MEDICATIONS AT HOME:   Prior to Admission medications   Medication Sig Start Date End Date Taking? Authorizing Provider  ACCU-CHEK SMARTVIEW test strip Test 4 times a day (before each meal and at bedtime). 02/17/15  Yes Historical Provider, MD  albuterol (PROVENTIL) (2.5 MG/3ML) 0.083% nebulizer solution Inhale 3 mLs into the lungs 4 (four) times daily as needed. 09/15/14  Yes Historical Provider, MD  ascorbic acid (VITAMIN C) 250 MG CHEW  Chew 250 mg by mouth daily.   Yes Historical Provider, MD  baclofen (LIORESAL) 10 MG tablet Take 1 tablet by mouth 2 (two) times daily.   Yes Historical Provider, MD  calcium carbonate (TUMS EX) 750 MG chewable tablet Chew 1 tablet by mouth daily.   Yes Historical Provider, MD  cetirizine (ZYRTEC) 10 MG tablet Take 10 mg by mouth daily as needed for  allergies or rhinitis.    Yes Historical Provider, MD  clonazePAM (KLONOPIN) 1 MG tablet Take 1 mg by mouth as needed for anxiety (for 3 or more seizures within an hour.).    Yes Historical Provider, MD  Dextromethorphan-Guaifenesin (GUAIFENESIN DM) 400-20 MG TABS Take 1 tablet by mouth every 12 (twelve) hours.   Yes Historical Provider, MD  diphenhydrAMINE (BENADRYL) 25 mg capsule Take 25 mg by mouth daily as needed (to pretreat antibiotics.).    Yes Historical Provider, MD  EPINEPHrine (EPIPEN JR) 0.15 MG/0.3ML injection Inject 0.15 mg into the muscle as needed.   Yes Historical Provider, MD  FIBER PO Take 2 tablets by mouth daily.    Yes Historical Provider, MD  glucagon (GLUCAGON EMERGENCY) 1 MG injection Inject 1 mg into the muscle as needed.   Yes Historical Provider, MD  insulin aspart (NOVOLOG) 100 UNIT/ML injection Inject 2 Units into the skin as needed for high blood sugar. Prn before meals for blood glucose greater than 200   Yes Historical Provider, MD  ketoconazole (NIZORAL) 2 % cream Apply 1 application topically 2 (two) times daily as needed for irritation.    Yes Historical Provider, MD  lactulose (CHRONULAC) 10 GM/15ML solution Take 30 mLs by mouth 2 (two) times daily.   Yes Historical Provider, MD  levETIRAcetam (KEPPRA) 100 MG/ML solution Place 2,250 mg into feeding tube 2 (two) times daily. 01/19/15  Yes Historical Provider, MD  levofloxacin (LEVAQUIN) 750 MG tablet Take 1 tablet (750 mg total) by mouth daily. 03/06/15 03/13/15 Yes Minna Antis, MD  Multiple Vitamin (MULTI-VITAMINS PO) Take 2 each by mouth daily. Take gummies.   Yes Historical Provider, MD  nystatin cream (MYCOSTATIN) Apply 1 application topically 3 (three) times daily as needed. For rash. 01/28/15  Yes Historical Provider, MD  OXYGEN Inhale 1-2 L into the lungs at bedtime.   Yes Historical Provider, MD  PULMICORT 0.5 MG/2ML nebulizer solution Inhale 1 vial into the lungs 2 (two) times daily. 02/08/15  Yes Historical  Provider, MD  ranitidine (ZANTAC) 75 MG/5ML syrup Take 10 mLs by mouth 2 (two) times daily. 12/21/14  Yes Historical Provider, MD  Valproic Acid (DEPAKENE) 250 MG/5ML SYRP syrup Take 10-15 mLs by mouth See admin instructions.  Take 10 mls orally twice daily (5am, 3pm) and 15 mls orally nightly at 11pm. 01/28/15  Yes Historical Provider, MD  bisacodyl (LAXATIVE) 10 MG suppository Place 1 suppository rectally at bedtime as needed for mild constipation, moderate constipation or severe constipation.     Historical Provider, MD  cefUROXime (CEFTIN) 500 MG tablet Take 1 tablet (500 mg total) by mouth 2 (two) times daily with a meal. 01/19/15   Alford Highland, MD  levETIRAcetam (KEPPRA) 250 MG tablet Take 2 tablets (500 mg total) by mouth 2 (two) times daily. 01/19/15   Alford Highland, MD  linezolid (ZYVOX) 600 MG tablet Take 1 tablet (600 mg total) by mouth 2 (two) times daily. 01/19/15   Alford Highland, MD      VITAL SIGNS:  Blood pressure 113/69, pulse 100, temperature 98.1 F (36.7 C), resp. rate  18, height 5\' 5"  (1.651 m), weight 73.029 kg (161 lb), SpO2 95 %.  PHYSICAL EXAMINATION:  Physical Exam  GENERAL:  24 y.o.-year-old patient lying in the bed in no acute distress.  EYES: Pupils equal, round, reactive to light and accommodation. No scleral icterus. Extraocular muscles intact.  HEENT: Head atraumatic, normocephalic. Oropharynx and nasopharynx clear. No oropharyngeal erythema, moist oral mucosa  NECK:  Supple, no jugular venous distention. No thyroid enlargement, no tenderness. Positive tracheostomy in place LUNGS: Normal breath sounds bilaterally, no wheezing, rales, rhonchi. No use of accessory muscles of respiration.  CARDIOVASCULAR: S1, S2 RRR. No murmurs, rubs, gallops, clicks.  ABDOMEN: Soft, nontender, nondistended. Bowel sounds present. No organomegaly or mass. Positive PEG tube in place EXTREMITIES: No pedal edema, cyanosis, or clubbing. + 2 pedal & radial pulses b/l.  Positive  muscular atrophy NEUROLOGIC: Quadriplegic, no focal motor or sensory deficits. PSYCHIATRIC: The patient is alert and oriented x 3.  SKIN: No obvious rash, lesion, or ulcer.   LABORATORY PANEL:   CBC  Recent Labs Lab 03/07/15 1315  WBC 12.2*  HGB 12.8*  HCT 36.4*  PLT 203   ------------------------------------------------------------------------------------------------------------------  Chemistries   Recent Labs Lab 03/07/15 1315  NA 131*  K 4.3  CL 100*  CO2 22  GLUCOSE 128*  BUN <5*  CREATININE 0.45*  CALCIUM 9.1   ------------------------------------------------------------------------------------------------------------------  Cardiac Enzymes No results for input(s): TROPONINI in the last 168 hours. ------------------------------------------------------------------------------------------------------------------  RADIOLOGY:  Dg Chest Portable 1 View  03/06/2015   CLINICAL DATA:  Pt arrived via EMS from home with family. Mother reports patient was seen by a home health agency on Monday and they were changing his clothes and pulled out the trach. Mother states that he has had trouble coming off the vent this week. A few days ago, red blood has been seen with suctioning which is concerning to family and other home health agency.  EXAM: PORTABLE CHEST - 1 VIEW  COMPARISON:  01/18/2015  FINDINGS: The left lung is now mostly opacified. Right lung remains clear. There is some apparent volume loss on the left suggesting that at least a component of the left lung opacity is due to atelectasis.  No pleural effusion or pneumothorax. The cardiac silhouette is well defined on this study.  Tracheostomy appears well positioned.  IMPRESSION: 1. New opacity throughout most of the left lung. This could all reflect atelectasis. This could be due to pneumonia or a combination. Clear right lung.   Electronically Signed   By: Amie Portland M.D.   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  #1 bacteremia-patient is noted to have gram-negative rod bacteremia in 2 out of 2 bottles. -The exact source of this is unclear. We will await urinalysis. Patient has no acute GI or urinary symptoms. -I will go ahead and start patient on IV ceftazidime. We'll get ID consult and discussed the case with Dr. Sampson Goon.  #2 pneumonia-patient was noted to have bloody tracheal secretions and had a chest x-ray findings yesterday in the ER suggested pneumonia. I will continue ceftazidime, Zithromax. -Continue local trach care.  #3 history of seizures-continue Keppra.  #4 diabetes type 2 without complication-continue NovoLog insulin.  #5 anxiety-continue Klonopin.  #6 traumatic brain injury status post tracheostomy/PEG  tube placement-continue local trach care. -Patient will be on a ventilator at bedtime. Continue vent settings as per home settings.    All the records are reviewed and case discussed with ED provider. Management plans discussed with the patient, family and they are in agreement.  CODE STATUS: Full  TOTAL TIME TAKING CARE OF THIS PATIENT: 50 minutes.    Houston Siren M.D on 03/07/2015 at 5:40 PM  Between 7am to 6pm - Pager - [phone]  After 6pm go to [email or web address] - password [email or web address] [facility]  [place] [facility]  Office  [phone]  CC: Primary care physician; Dorothey Baseman, MD

## 2015-03-07 NOTE — ED Provider Notes (Addendum)
[facility] Emergency Department Provider Note  Time seen: 1:20 PM  I have reviewed the triage vital signs and the nursing notes.   HISTORY  Chief Complaint Blood Infection    HPI Exodus Kutzer is a 24 y.o. male with a past medical history of traumatic brain injury, diabetes, seizure disorder, presents the emergency department with positive blood cultures. Patient was seen in the emergency department yesterday, diagnosed with pneumonia and discharged on Levaquin. At that time the patient was encouraged to be admitted but the father strongly wish for the patient to be discharged home. Patient's blood cultures have returned positive for gram-negative rods. Father denies any fever at home, states the patient has been doing well. Continues to state some hemoptysis with bloody sputum from the tracheostomy.     Past Medical History  Diagnosis Date  . Brain injury 2002  . Diabetes mellitus without complication   . Seizures   . Asthma     Patient Active Problem List   Diagnosis Date Noted  . Acute on chronic respiratory failure 01/14/2015  . Left lower lobe pneumonia 01/14/2015  . Hypotension 01/14/2015  . Sepsis 01/14/2015  . S/P ventriculoperitoneal shunt 01/14/2015  . H/O tracheostomy 01/14/2015  . S/P percutaneous endoscopic gastrostomy (PEG) tube placement 01/14/2015  . Scoliosis 01/14/2015  . 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 Brain surgery  2002  . Tracheostomy tube placement    . Sp perc place gastric tube    . Csf shunt      Current Outpatient Rx  Name  Route  Sig  Dispense  Refill  . albuterol (PROVENTIL) (2.5 MG/3ML) 0.083% nebulizer solution   Inhalation   Inhale 3 mLs into the lungs 4 (four) times daily as needed.         . Ascorbic Acid (VITAMIN C) 1000 MG tablet   Oral   Take 1 tablet by mouth daily.         . baclofen (LIORESAL) 10 MG tablet   Oral   Take 1 tablet by  mouth 2 (two) times daily.         . bisacodyl (LAXATIVE) 10 MG suppository   Rectal   Place 1 suppository rectally at bedtime as needed.         . budesonide (PULMICORT) 0.25 MG/2ML nebulizer solution   Inhalation   Inhale 2 mLs into the lungs every 12 (twelve) hours.         . calcium carbonate (TUMS EX) 750 MG chewable tablet   Oral   Chew 1 tablet by mouth daily.         . cefUROXime (CEFTIN) 500 MG tablet   Oral   Take 1 tablet (500 mg total) by mouth 2 (two) times daily with a meal.   10 tablet   0   . cetirizine (ZYRTEC) 10 MG tablet   Oral   Take 10 mg by mouth as needed for allergies.         . clonazePAM (KLONOPIN) 1 MG tablet   Oral   Take 1 mg by mouth as needed for anxiety.         . Dextromethorphan-Guaifenesin (GUAIFENESIN DM) 400-20 MG TABS   Oral   Take 1 tablet by mouth every 12 (twelve) hours.         . diphenhydrAMINE (BENADRYL) 25 mg capsule   Oral   Take 1 tablet by mouth daily.         Marland Kitchen  EPINEPHrine (EPIPEN JR) 0.15 MG/0.3ML injection   Intramuscular   Inject 0.15 mg into the muscle as needed.         Marland Kitchen FIBER PO   Oral   Take 1 Dose by mouth daily.         Marland Kitchen glucagon (GLUCAGON EMERGENCY) 1 MG injection   Intramuscular   Inject 1 mg into the muscle as needed.         . insulin aspart (NOVOLOG) 100 UNIT/ML injection   Subcutaneous   Inject 2 Units into the skin as needed for high blood sugar. Prn before meals for blood glucose greater than 200         . ketoconazole (NIZORAL) 2 % cream   Topical   Apply 1 application topically daily.         Marland Kitchen lactulose (CHRONULAC) 10 GM/15ML solution   Oral   Take 30 mLs by mouth 2 (two) times daily.         Marland Kitchen levETIRAcetam (KEPPRA) 250 MG tablet   Oral   Take 2 tablets (500 mg total) by mouth 2 (two) times daily.   60 tablet   0   . levofloxacin (LEVAQUIN) 750 MG tablet   Oral   Take 1 tablet (750 mg total) by mouth daily.   10 tablet   0   . linezolid (ZYVOX)  600 MG tablet   Oral   Take 1 tablet (600 mg total) by mouth 2 (two) times daily.   10 tablet   0   . neomycin-bacitracin-polymyxin (NEOSPORIN) ophthalmic ointment      1 application daily.         Marland Kitchen nystatin ointment (MYCOSTATIN)      1 application daily.         . ranitidine (ZANTAC) 75 MG/5ML syrup   Oral   Take 10 mLs by mouth 2 (two) times daily.      4   . valproic acid (DEPAKENE) 250 MG capsule   Oral   Take 500 mg by mouth 2 (two) times daily.         Marland Kitchen valproic acid (DEPAKENE) 250 MG capsule   Oral   Take 750 mg by mouth at bedtime.           Allergies Sulfa antibiotics; Zosyn; Vancomycin; and Penicillins  History reviewed. No pertinent family history.  Social History Social History  Substance Use Topics  . Smoking status: Never Smoker   . Smokeless tobacco: None  . Alcohol Use: No    Review of Systems Constitutional: Negative for fever Cardiovascular: Negative for chest pain. Respiratory: Negative for shortness of breath. Positive for cough with occasional bloody sputum. Gastrointestinal: Negative for abdominal pain 10-point ROS otherwise negative.  ____________________________________________   PHYSICAL EXAM:  VITAL SIGNS: ED Triage Vitals  Enc Vitals Group     BP 03/07/15 1317 140/70 mmHg     Pulse Rate 03/07/15 1317 102     Resp 03/07/15 1317 18     Temp 03/07/15 1317 98.1 F (36.7 C)     Temp src --      SpO2 03/07/15 1317 95 %     Weight 03/07/15 1317 161 lb (73.029 kg)     Height 03/07/15 1317 5\' 5"  (1.651 m)     Head Cir --      Peak Flow --      Pain Score --      Pain Loc --      Pain Edu? --  Excl. in GC? --     Constitutional: Alert.  Well appearing and in no distress. Eyes: Normal exam ENT   Head: Normocephalic and atraumatic. Tracheostomy present. No bloody sputum currently. Cardiovascular: Normal rate, regular rhythm around 100 bpm. Respiratory: Normal respiratory effort without tachypnea nor  retractions. Breath sounds are clear and equal bilaterally. No obvious rhonchi on exam. Gastrointestinal: Soft and nontender. No distention.  No reaction to abdominal palpation. Musculoskeletal: Nontender  Neurologic:  Patient at baseline per father. Skin:  Skin is warm, dry and intact.  Psychiatric: Mood and affect are normal, for patient. At baseline.  ____________________________________________   INITIAL IMPRESSION / ASSESSMENT AND PLAN / ED COURSE  Pertinent labs & imaging results that were available during my care of the patient were reviewed by me and considered in my medical decision making (see chart for details).  Patient diagnosed yesterday with a pneumonia. Discharge on Levaquin but has grown positive blood cultures. We will recheck labs today, begin IV antibiotics, recheck blood cultures and admit to the hospital. Father states a rash with penicillin's, we will pretreat with Benadryl and dose Rocephin IV while awaiting blood culture/sensitivity results.  ----------------------------------------- 2:41 PM on 03/07/2015 -----------------------------------------  Ultrasound-guided IV placed by myself under sterile technique, a 20-gauge IV catheter placed the left antecubital fossa. No complications, patient tolerated procedure very well.   Labs, urinalysis pending. I discussed the patient with hospitalist, they are awaiting lab results before admission. Patient care signed out to Dr. Shaune Pollack.  ____________________________________________   FINAL CLINICAL IMPRESSION(S) / ED DIAGNOSES  Bacteremia Pneumonia   Minna Antis, MD 03/07/15 1324  Minna Antis, MD 03/07/15 1442  Minna Antis, MD 03/07/15 [phone]

## 2015-03-07 NOTE — Progress Notes (Signed)
ANTIBIOTIC CONSULT NOTE - INITIAL  Pharmacy Consult for Ceftazidime Indication: sepsis  Allergies  Allergen Reactions  . Sulfa Antibiotics Anaphylaxis  . Zosyn [Piperacillin-Tazobactam In Dex] Anaphylaxis  . Ciprofloxacin Other (See Comments)    HYPOTENSION  . Vancomycin   . Penicillins Rash    Patient Measurements: Height: 5\' 5"  (165.1 cm) Weight: 161 lb (73.029 kg) IBW/kg (Calculated) : 61.5 Adjusted Body Weight:   Vital Signs: Temp: 98.1 F (36.7 C) (08/28 1652) BP: 113/67 mmHg (08/28 1830) Pulse Rate: 102 (08/28 1830) Intake/Output from previous day:   Intake/Output from this shift:    Labs:  Recent Labs  03/06/15 1035 03/07/15 1315  WBC 13.2* 12.2*  HGB 13.2 12.8*  PLT 208 203  CREATININE 0.37* 0.45*   Estimated Creatinine Clearance: 126 mL/min (by C-G formula based on Cr of 0.45). No results for input(s): VANCOTROUGH, VANCOPEAK, VANCORANDOM, GENTTROUGH, GENTPEAK, GENTRANDOM, TOBRATROUGH, TOBRAPEAK, TOBRARND, AMIKACINPEAK, AMIKACINTROU, AMIKACIN in the last 72 hours.   Microbiology: Recent Results (from the past 720 hour(s))  Blood culture (routine x 2)     Status: None (Preliminary result)   Collection Time: 03/06/15 11:47 AM  Result Value Ref Range Status   Specimen Description BLOOD RIGHT HAND  Final   Special Requests BOTTLES DRAWN AEROBIC AND ANAEROBIC 1CC  Final   Culture  Setup Time   Final    GRAM NEGATIVE RODS IN BOTH AEROBIC AND ANAEROBIC BOTTLES CRITICAL RESULT CALLED TO, READ BACK BY AND VERIFIED WITH: RACHEL HAYDEN AT 0536 ON 03/07/15 RWW    Culture   Final    GRAM NEGATIVE RODS IN BOTH AEROBIC AND ANAEROBIC BOTTLES IDENTIFICATION TO FOLLOW    Report Status PENDING  Incomplete    Medical History: Past Medical History  Diagnosis Date  . Brain injury 2002  . Diabetes mellitus without complication   . Seizures   . Asthma     Medications:  Prescriptions prior to admission  Medication Sig Dispense Refill Last Dose  . ACCU-CHEK  SMARTVIEW test strip Test 4 times a day (before each meal and at bedtime).  11 03/06/2015 at Unknown time  . albuterol (PROVENTIL) (2.5 MG/3ML) 0.083% nebulizer solution Inhale 3 mLs into the lungs 4 (four) times daily as needed.   03/06/2015 at Unknown time  . ascorbic acid (VITAMIN C) 250 MG CHEW Chew 250 mg by mouth daily.   03/06/2015 at Unknown time  . baclofen (LIORESAL) 10 MG tablet Take 1 tablet by mouth 2 (two) times daily.   03/06/2015 at Unknown time  . calcium carbonate (TUMS EX) 750 MG chewable tablet Chew 1 tablet by mouth daily.   03/06/2015 at Unknown time  . cetirizine (ZYRTEC) 10 MG tablet Take 10 mg by mouth daily as needed for allergies or rhinitis.    03/06/2015 at Unknown time  . clonazePAM (KLONOPIN) 1 MG tablet Take 1 mg by mouth as needed for anxiety (for 3 or more seizures within an hour.).    prn  . Dextromethorphan-Guaifenesin (GUAIFENESIN DM) 400-20 MG TABS Take 1 tablet by mouth every 12 (twelve) hours.   prn  . diphenhydrAMINE (BENADRYL) 25 mg capsule Take 25 mg by mouth daily as needed (to pretreat antibiotics.).    03/06/2015 at Unknown time  . EPINEPHrine (EPIPEN JR) 0.15 MG/0.3ML injection Inject 0.15 mg into the muscle as needed.   prn  . FIBER PO Take 2 tablets by mouth daily.    03/06/2015 at Unknown time  . glucagon (GLUCAGON EMERGENCY) 1 MG injection Inject 1 mg into  the muscle as needed.   prn  . insulin aspart (NOVOLOG) 100 UNIT/ML injection Inject 2 Units into the skin as needed for high blood sugar. Prn before meals for blood glucose greater than 200   prn  . ketoconazole (NIZORAL) 2 % cream Apply 1 application topically 2 (two) times daily as needed for irritation.    prn  . lactulose (CHRONULAC) 10 GM/15ML solution Take 30 mLs by mouth 2 (two) times daily.   03/06/2015 at Unknown time  . levETIRAcetam (KEPPRA) 100 MG/ML solution Place 2,250 mg into feeding tube 2 (two) times daily.  0 03/06/2015 at Unknown time  . levofloxacin (LEVAQUIN) 750 MG tablet Take 1 tablet  (750 mg total) by mouth daily. 10 tablet 0 03/07/2015 at Unknown time  . Multiple Vitamin (MULTI-VITAMINS PO) Take 2 each by mouth daily. Take gummies.   03/06/2015 at Unknown time  . nystatin cream (MYCOSTATIN) Apply 1 application topically 3 (three) times daily as needed. For rash.  1 prn  . OXYGEN Inhale 1-2 L into the lungs at bedtime.   prn  . PULMICORT 0.5 MG/2ML nebulizer solution Inhale 1 vial into the lungs 2 (two) times daily.  3 03/06/2015 at Unknown time  . ranitidine (ZANTAC) 75 MG/5ML syrup Take 10 mLs by mouth 2 (two) times daily.  4 03/06/2015 at Unknown time  . Valproic Acid (DEPAKENE) 250 MG/5ML SYRP syrup Take 10-15 mLs by mouth See admin instructions.  Take 10 mls orally twice daily (5am, 3pm) and 15 mls orally nightly at 11pm.  4 03/06/2015 at Unknown time  . bisacodyl (LAXATIVE) 10 MG suppository Place 1 suppository rectally at bedtime as needed for mild constipation, moderate constipation or severe constipation.    prn  . cefUROXime (CEFTIN) 500 MG tablet Take 1 tablet (500 mg total) by mouth 2 (two) times daily with a meal. 10 tablet 0   . levETIRAcetam (KEPPRA) 250 MG tablet Take 2 tablets (500 mg total) by mouth 2 (two) times daily. 60 tablet 0   . linezolid (ZYVOX) 600 MG tablet Take 1 tablet (600 mg total) by mouth 2 (two) times daily. 10 tablet 0    Assessment: CrCl = 126 ml/min  Pt growing GNR in BC X 2. Pt allergic to PCN (anaphylaxis to zosyn).   Goal of Therapy:  resolution of infection  Plan:  Will order Ceftazidime 2 gm IV Q8H .  Phelicia Dantes D 03/07/2015,7:38 PM

## 2015-03-07 NOTE — ED Provider Notes (Signed)
[facility]  I accepted care from Dr. Lenard Lance ____________________________________________    LABS (pertinent positives/negatives)  White blood count 12.2, hemoglobin 12.8, platelets 203 Basic metabolic panel significant for sodium 131, chloride 100, glucose 128, BUN less than 5 and creatinine 0.45 Urinalysis pending ____________________________________________    RADIOLOGY All xrays were viewed by me. Imaging interpreted by radiologist.  None  ____________________________________________   PROCEDURES  Procedure(s) performed: None  Critical Care performed: None  ____________________________________________   INITIAL IMPRESSION / ASSESSMENT AND PLAN / ED COURSE  CONSULTATIONS: Face-to-face with hospitalist for admission  Pertinent labs & imaging results that were available during my care of the patient were reviewed by me and considered in my medical decision making (see chart for details).  Patient to be admitted for bacteremia and left lower lobe pneumonia. Extensive amount of time spent with the family and the patient's MAR to determine the home medications, preparation, and times.  Patient's 3 PM meds were written for to be given here in the emergency department.  I provided the [facility] with the chart for the hospitalist physician,Dr. Cherlynn Kaiser, and the inpatient nurse in order to make the home medication schedule for here in Hospital match their home.   Patient / Family / Caregiver informed of clinical course, medical decision-making process, and agree with plan.   ____________________________________________   FINAL CLINICAL IMPRESSION(S) / ED DIAGNOSES  Final diagnoses:  Bacteremia  Left lower lobe pneumonia        Governor Rooks, MD 03/07/15 1752

## 2015-03-07 NOTE — ED Notes (Signed)
Was seen yesterday for possible infection then called back in today for positive blood cultures

## 2015-03-07 NOTE — ED Notes (Signed)
RN attempted contact x2, no response. Left voicemail for return contact to [phone]

## 2015-03-08 DIAGNOSIS — J189 Pneumonia, unspecified organism: Secondary | ICD-10-CM

## 2015-03-08 DIAGNOSIS — J9600 Acute respiratory failure, unspecified whether with hypoxia or hypercapnia: Secondary | ICD-10-CM

## 2015-03-08 DIAGNOSIS — R7881 Bacteremia: Secondary | ICD-10-CM

## 2015-03-08 LAB — BASIC METABOLIC PANEL
ANION GAP: 7 (ref 5–15)
BUN: 5 mg/dL — ABNORMAL LOW (ref 6–20)
CALCIUM: 8.8 mg/dL — AB (ref 8.9–10.3)
CO2: 25 mmol/L (ref 22–32)
Chloride: 104 mmol/L (ref 101–111)
Creatinine, Ser: 0.39 mg/dL — ABNORMAL LOW (ref 0.61–1.24)
GFR calc non Af Amer: >60 mL/min (ref >60)
Glucose, Bld: 111 mg/dL — ABNORMAL HIGH (ref 65–99)
Potassium: 4 mmol/L (ref 3.5–5.1)
SODIUM: 136 mmol/L (ref 135–145)

## 2015-03-08 LAB — BLOOD GAS, ARTERIAL
ALLENS TEST (PASS/FAIL): POSITIVE — AB
Acid-base deficit: 2.7 mmol/L — ABNORMAL HIGH (ref 0.0–2.0)
Acid-base deficit: 1.8 mmol/L (ref 0.0–2.0)
Bicarbonate: 20.6 mEq/L — ABNORMAL LOW (ref 21.0–28.0)
Bicarbonate: 23.7 mEq/L (ref 21.0–28.0)
FIO2: 0.65
FIO2: 1
MECHANICAL RATE: 15
MECHVT: 370 mL
Mechanical Rate: 10
O2 Saturation: 99 %
O2 Saturation: 95.1 %
PATIENT TEMPERATURE: 37
PCO2 ART: 42 mmHg (ref 32.0–48.0)
PEEP: 5 cmH2O
PEEP: 15 cmH2O
PH ART: 7.36 (ref 7.350–7.450)
Patient temperature: 37
RATE: 15 resp/min
VT: 400 mL
pCO2 arterial: 31 mmHg — ABNORMAL LOW (ref 32.0–48.0)
pH, Arterial: 7.43 (ref 7.350–7.450)
pO2, Arterial: 127 mmHg — ABNORMAL HIGH (ref 83.0–108.0)
pO2, Arterial: 79 mmHg — ABNORMAL LOW (ref 83.0–108.0)

## 2015-03-08 LAB — GLUCOSE, CAPILLARY
GLUCOSE-CAPILLARY: 244 mg/dL — AB (ref 65–99)
Glucose-Capillary: 149 mg/dL — ABNORMAL HIGH (ref 65–99)

## 2015-03-08 LAB — CBC
HCT: 37.3 % — ABNORMAL LOW (ref 40.0–52.0)
HEMOGLOBIN: 13 g/dL (ref 13.0–18.0)
MCH: 32.3 pg (ref 26.0–34.0)
MCHC: 34.8 g/dL (ref 32.0–36.0)
MCV: 92.8 fL (ref 80.0–100.0)
Platelets: 220 10*3/uL (ref 150–440)
RBC: 4.02 MIL/uL — AB (ref 4.40–5.90)
RDW: 14.2 % (ref 11.5–14.5)
WBC: 10.9 10*3/uL — AB (ref 3.8–10.6)

## 2015-03-08 LAB — URINE CULTURE: Culture: NO GROWTH

## 2015-03-08 MED ORDER — FUROSEMIDE 10 MG/ML IJ SOLN
40.0000 mg | Freq: Once | INTRAMUSCULAR | Status: AC
  Administered 2015-03-08: 40 mg via INTRAVENOUS
  Filled 2015-03-08: qty 4

## 2015-03-08 MED ORDER — VECURONIUM BROMIDE 10 MG IV SOLR: 10.0000 mg | INTRAVENOUS | Status: DC | PRN

## 2015-03-08 MED ORDER — IPRATROPIUM-ALBUTEROL 0.5-2.5 (3) MG/3ML IN SOLN
RESPIRATORY_TRACT | Status: AC
  Filled 2015-03-08: qty 3

## 2015-03-08 MED ORDER — IPRATROPIUM-ALBUTEROL 0.5-2.5 (3) MG/3ML IN SOLN
RESPIRATORY_TRACT | Status: AC
  Administered 2015-03-08: 3 mL
  Filled 2015-03-08: qty 3

## 2015-03-08 MED ORDER — FENTANYL CITRATE (PF) 100 MCG/2ML IJ SOLN
25.0000 ug | INTRAMUSCULAR | Status: DC | PRN
  Administered 2015-03-08 – 2015-03-14 (×6): 25 ug via INTRAVENOUS
  Filled 2015-03-08 (×7): qty 2

## 2015-03-08 NOTE — Consult Note (Signed)
PULMONARY / CRITICAL CARE MEDICINE   Name: Roger Tate MRN: [id number] DOB: 06-Jul-1991    ADMISSION DATE:  03/07/2015    CHIEF COMPLAINT:   Acute hypoxic resp failure   HISTORY OF PRESENT ILLNESS   24 y.o. male with a known history of traumatic brain injury, diabetes, seizures, asthma, status post PEG, status post tracheostomy who presents to the hospital as he was noted to have positive blood cultures(gram neg bacteremia)   Patient apparently was brought to the emergency room yesterday as he was having some bloody tracheal secretions. Patient was seen in the ER and diagnosed with a pneumonia.    As per the patient's parents he has not had any fever, nausea, vomiting, abdominal pain, diarrhea. He's only had some bloody tracheal secretions which they attribute to some trauma to the tracheostomy by the home health nurse.   CXR shows Left sided opacificiation, fio2 at 55% now Plan for bed side bronc today   The Risks and Benefits of the Bronchoscopy were explained to patient/family and I have discussed the risk for acute bleeding, increased chance of infection, increased chance of respiratory failure and cardiac arrest and death.   The patient/family understand the risks and benefits and have agreed to proceed with procedure. Father signed consent    SIGNIFICANT EVENTS   TBI, s/p trach, s/p peg tube CHRONIC VENT DEPENDANT   PAST MEDICAL HISTORY    :  Past Medical History  Diagnosis Date  . Brain injury 2002  . Diabetes mellitus without complication   . Seizures   . Asthma    Past Surgical History  Procedure Laterality Date  . Brain surgery  2002  . Tracheostomy tube placement    . Sp perc place gastric tube    . Csf shunt     Prior to Admission medications   Medication Sig Start Date End Date Taking? Authorizing Provider  ACCU-CHEK SMARTVIEW test strip Test 4 times a day (before each meal and at bedtime). 02/17/15  Yes Historical Provider, MD  albuterol  (PROVENTIL) (2.5 MG/3ML) 0.083% nebulizer solution Inhale 3 mLs into the lungs 4 (four) times daily as needed. 09/15/14  Yes Historical Provider, MD  ascorbic acid (VITAMIN C) 250 MG CHEW Chew 250 mg by mouth daily.   Yes Historical Provider, MD  baclofen (LIORESAL) 10 MG tablet Take 1 tablet by mouth 2 (two) times daily.   Yes Historical Provider, MD  calcium carbonate (TUMS EX) 750 MG chewable tablet Chew 1 tablet by mouth daily.   Yes Historical Provider, MD  cetirizine (ZYRTEC) 10 MG tablet Take 10 mg by mouth daily as needed for allergies or rhinitis.    Yes Historical Provider, MD  clonazePAM (KLONOPIN) 1 MG tablet Take 1 mg by mouth as needed for anxiety (for 3 or more seizures within an hour.).    Yes Historical Provider, MD  Dextromethorphan-Guaifenesin (GUAIFENESIN DM) 400-20 MG TABS Take 1 tablet by mouth every 12 (twelve) hours.   Yes Historical Provider, MD  diphenhydrAMINE (BENADRYL) 25 mg capsule Take 25 mg by mouth daily as needed (to pretreat antibiotics.).    Yes Historical Provider, MD  EPINEPHrine (EPIPEN JR) 0.15 MG/0.3ML injection Inject 0.15 mg into the muscle as needed.   Yes Historical Provider, MD  FIBER PO Take 2 tablets by mouth daily.    Yes Historical Provider, MD  glucagon (GLUCAGON EMERGENCY) 1 MG injection Inject 1 mg into the muscle as needed.   Yes Historical Provider, MD  insulin aspart (NOVOLOG) 100 UNIT/ML  injection Inject 2 Units into the skin as needed for high blood sugar. Prn before meals for blood glucose greater than 200   Yes Historical Provider, MD  ketoconazole (NIZORAL) 2 % cream Apply 1 application topically 2 (two) times daily as needed for irritation.    Yes Historical Provider, MD  lactulose (CHRONULAC) 10 GM/15ML solution Take 30 mLs by mouth 2 (two) times daily.   Yes Historical Provider, MD  levETIRAcetam (KEPPRA) 100 MG/ML solution Place 2,250 mg into feeding tube 2 (two) times daily. 01/19/15  Yes Historical Provider, MD  levofloxacin (LEVAQUIN)  750 MG tablet Take 1 tablet (750 mg total) by mouth daily. 03/06/15 03/13/15 Yes Minna Antis, MD  Multiple Vitamin (MULTI-VITAMINS PO) Take 2 each by mouth daily. Take gummies.   Yes Historical Provider, MD  nystatin cream (MYCOSTATIN) Apply 1 application topically 3 (three) times daily as needed. For rash. 01/28/15  Yes Historical Provider, MD  OXYGEN Inhale 1-2 L into the lungs at bedtime.   Yes Historical Provider, MD  PULMICORT 0.5 MG/2ML nebulizer solution Inhale 1 vial into the lungs 2 (two) times daily. 02/08/15  Yes Historical Provider, MD  ranitidine (ZANTAC) 75 MG/5ML syrup Take 10 mLs by mouth 2 (two) times daily. 12/21/14  Yes Historical Provider, MD  Valproic Acid (DEPAKENE) 250 MG/5ML SYRP syrup Take 10-15 mLs by mouth See admin instructions.  Take 10 mls orally twice daily (5am, 3pm) and 15 mls orally nightly at 11pm. 01/28/15  Yes Historical Provider, MD  bisacodyl (LAXATIVE) 10 MG suppository Place 1 suppository rectally at bedtime as needed for mild constipation, moderate constipation or severe constipation.     Historical Provider, MD  cefUROXime (CEFTIN) 500 MG tablet Take 1 tablet (500 mg total) by mouth 2 (two) times daily with a meal. 01/19/15   Alford Highland, MD  levETIRAcetam (KEPPRA) 250 MG tablet Take 2 tablets (500 mg total) by mouth 2 (two) times daily. 01/19/15   Alford Highland, MD  linezolid (ZYVOX) 600 MG tablet Take 1 tablet (600 mg total) by mouth 2 (two) times daily. 01/19/15   Alford Highland, MD   Allergies  Allergen Reactions  . Sulfa Antibiotics Anaphylaxis  . Zosyn [Piperacillin-Tazobactam In Dex] Anaphylaxis  . Ciprofloxacin Other (See Comments)    HYPOTENSION  . Vancomycin   . Penicillins Rash     FAMILY HISTORY   Family History  Problem Relation Age of Onset  . Bipolar disorder Mother   . Diabetes Father       SOCIAL HISTORY    reports that he has never smoked. He does not have any smokeless tobacco history on file. He reports that he does  not drink alcohol or use illicit drugs.  Review of Systems  Unable to perform ROS: critical illness      VITAL SIGNS    Temp:  [98 F (36.7 C)-100 F (37.8 C)] 98.7 F (37.1 C) (08/29 0600) Pulse Rate:  [92-107] 105 (08/29 0800) Resp:  [17-26] 18 (08/29 0800) BP: (79-140)/(56-77) 106/66 mmHg (08/29 0800) SpO2:  [87 %-100 %] 89 % (08/29 0800) FiO2 (%):  [28 %-65 %] 65 % (08/29 0550) Weight:  [161 lb (73.029 kg)] 161 lb (73.029 kg) (08/28 1317) HEMODYNAMICS:   VENTILATOR SETTINGS: Vent Mode:  [-] SIMV;PSV FiO2 (%):  [28 %-65 %] 65 % Set Rate:  [10 bmp] 10 bmp Vt Set:  [370 mL] 370 mL PEEP:  [5 cmH20] 5 cmH20 Pressure Support:  [0 cmH20] 0 cmH20 INTAKE / OUTPUT:  Intake/Output Summary (Last  24 hours) at 03/08/15 1109 Last data filed at 03/08/15 0545  Gross per 24 hour  Intake    915 ml  Output    300 ml  Net    615 ml       PHYSICAL EXAM   Physical Exam  Constitutional: He appears distressed.  HENT:  Head: Normocephalic and atraumatic.  Eyes: Pupils are equal, round, and reactive to light. No scleral icterus.  Neck: Normal range of motion. Neck supple.  Cardiovascular: Normal rate and regular rhythm.   No murmur heard. Pulmonary/Chest: He is in respiratory distress. He has no wheezes. He has rales.  resp distress  Abdominal: Soft. He exhibits no distension. There is no tenderness.  Musculoskeletal: He exhibits no edema.  Contracted ext  Neurological: He displays normal reflexes. Coordination normal.  Unresosponsive, moans and groans at bedside  Skin: Skin is warm. No rash noted. He is diaphoretic.       LABS   LABS:  CBC  Recent Labs Lab 03/06/15 1035 03/07/15 1315 03/08/15 0443  WBC 13.2* 12.2* 10.9*  HGB 13.2 12.8* 13.0  HCT 37.8* 36.4* 37.3*  PLT 208 203 220   Coag's No results for input(s): APTT, INR in the last 168 hours. BMET  Recent Labs Lab 03/06/15 1035 03/07/15 1315 03/08/15 0443  NA 125* 131* 136  K 3.7 4.3 4.0  CL  94* 100* 104  CO2 20* 22 25  BUN <5* <5* 5*  CREATININE 0.37* 0.45* 0.39*  GLUCOSE 134* 128* 111*   Electrolytes  Recent Labs Lab 03/06/15 1035 03/07/15 1315 03/08/15 0443  CALCIUM 8.8* 9.1 8.8*   Sepsis Markers No results for input(s): LATICACIDVEN, PROCALCITON, O2SATVEN in the last 168 hours. ABG  Recent Labs Lab 03/08/15 0650  PHART 7.43  PCO2ART 31*  PO2ART 127*   Liver Enzymes No results for input(s): AST, ALT, ALKPHOS, BILITOT, ALBUMIN in the last 168 hours. Cardiac Enzymes No results for input(s): TROPONINI, PROBNP in the last 168 hours. Glucose  Recent Labs Lab 03/08/15 0023 03/08/15 0835  GLUCAP 244* 149*     Recent Results (from the past 240 hour(s))  Blood culture (routine x 2)     Status: None (Preliminary result)   Collection Time: 03/06/15 10:39 AM  Result Value Ref Range Status   Specimen Description BLOOD RIGHT HAND  Final   Special Requests BOTTLES DRAWN AEROBIC AND ANAEROBIC  1CC  Final   Culture NO GROWTH 2 DAYS  Final   Report Status PENDING  Incomplete  Blood culture (routine x 2)     Status: None (Preliminary result)   Collection Time: 03/06/15 11:47 AM  Result Value Ref Range Status   Specimen Description BLOOD RIGHT HAND  Final   Special Requests BOTTLES DRAWN AEROBIC AND ANAEROBIC 1CC  Final   Culture  Setup Time   Final    GRAM POSITIVE COCCI ANAEROBIC BOTTLE ONLY CRITICAL RESULT CALLED TO, READ BACK BY AND VERIFIED WITH: JOANNA LINDSAY AT 1610 03/08/15 DV GRAM NEGATIVE RODS AEROBIC BOTTLE ONLY CRITICAL RESULT CALLED TO, READ BACK BY AND VERIFIED WITH: RACHEL HAYDEN AT 0536 ON 03/07/15 RWW    Culture   Final    GRAM NEGATIVE RODS AEROBIC BOTTLE ONLY COAGULASE NEGATIVE STAPHYLOCOCCUS ANAEROBIC BOTTLE ONLY IDENTIFICATION AND SUSCEPTIBILITIES TO FOLLOW    Report Status PENDING  Incomplete  Blood culture (routine x 2)     Status: None (Preliminary result)   Collection Time: 03/07/15  3:00 PM  Result Value Ref Range Status  Specimen Description BLOOD LEFT ASSIST CONTROL  Final   Special Requests   Final    BOTTLES DRAWN AEROBIC AND ANAEROBIC  AER 5CC ANA 2CC   Culture NO GROWTH < 24 HOURS  Final   Report Status PENDING  Incomplete  Blood culture (routine x 2)     Status: None (Preliminary result)   Collection Time: 03/07/15  4:42 PM  Result Value Ref Range Status   Specimen Description BLOOD LEFT ARM  Final   Special Requests BOTTLES DRAWN AEROBIC AND ANAEROBIC  4CC  Final   Culture NO GROWTH < 24 HOURS  Final   Report Status PENDING  Incomplete  MRSA PCR Screening     Status: None   Collection Time: 03/07/15  7:43 PM  Result Value Ref Range Status   MRSA by PCR NEGATIVE NEGATIVE Final    Comment:        The GeneXpert MRSA Assay (FDA approved for NASAL specimens only), is one component of a comprehensive MRSA colonization surveillance program. It is not intended to diagnose MRSA infection nor to guide or monitor treatment for MRSA infections.      Current facility-administered medications:  .  acetaminophen (TYLENOL) tablet 650 mg, 650 mg, Oral, Q6H PRN **OR** acetaminophen (TYLENOL) suppository 650 mg, 650 mg, Rectal, Q6H PRN, Houston Siren, MD .  albuterol (PROVENTIL) (2.5 MG/3ML) 0.083% nebulizer solution 3 mL, 3 mL, Inhalation, Q4H PRN, Houston Siren, MD .  antiseptic oral rinse solution (CORINZ), 7 mL, Mouth Rinse, QID, Wyatt Haste, MD, 7 mL at 03/08/15 0346 .  azithromycin (ZITHROMAX) 250 mg in dextrose 5 % 125 mL IVPB, 250 mg, Intravenous, Q24H, Houston Siren, MD, 250 mg at 03/07/15 2005 .  baclofen (LIORESAL) tablet 10 mg, 10 mg, Oral, BID, Houston Siren, MD, 10 mg at 03/08/15 1610 .  bisacodyl (DULCOLAX) suppository 10 mg, 10 mg, Rectal, QHS PRN, Houston Siren, MD .  budesonide (PULMICORT) nebulizer solution 0.5 mg, 0.5 mg, Inhalation, BID, Houston Siren, MD, 0.5 mg at 03/08/15 0801 .  calcium carbonate (TUMS - dosed in mg elemental calcium) chewable tablet 200 mg of  elemental calcium, 1 tablet, Oral, Daily, Houston Siren, MD, 200 mg of elemental calcium at 03/07/15 2239 .  cefTAZidime (FORTAZ) 2 g in dextrose 5 % 50 mL IVPB, 2 g, Intravenous, 3 times per day, Houston Siren, MD, 2 g at 03/08/15 0545 .  chlorhexidine gluconate (PERIDEX) 0.12 % solution 15 mL, 15 mL, Mouth Rinse, BID, Wyatt Haste, MD, 15 mL at 03/07/15 2303 .  clonazePAM (KLONOPIN) tablet 1 mg, 1 mg, Oral, PRN, Houston Siren, MD .  clotrimazole (LOTRIMIN) 1 % cream, , Topical, BID, Houston Siren, MD .  diphenhydrAMINE (BENADRYL) capsule 25 mg, 25 mg, Oral, Daily PRN, Houston Siren, MD .  enoxaparin (LOVENOX) injection 40 mg, 40 mg, Subcutaneous, Q24H, Houston Siren, MD, 40 mg at 03/07/15 2258 .  guaiFENesin-dextromethorphan (ROBITUSSIN DM) 100-10 MG/5ML syrup 10 mL, 10 mL, Oral, Q12H, Houston Siren, MD, 10 mL at 03/08/15 0313 .  insulin aspart (novoLOG) injection 2 Units, 2 Units, Subcutaneous, PRN, Houston Siren, MD .  lactulose (CHRONULAC) 10 GM/15ML solution 20 g, 20 g, Oral, BID, Houston Siren, MD, 20 g at 03/08/15 9604 .  levETIRAcetam (KEPPRA) 100 MG/ML solution 2,250 mg, 2,250 mg, Per Tube, BID, Houston Siren, MD, 2,250 mg at 03/08/15 0346 .  loratadine (CLARITIN) tablet 10 mg, 10 mg, Oral, Daily,  Houston Siren, MD, 10 mg at 03/07/15 2238 .  multivitamin with minerals tablet, , Oral, Daily, Houston Siren, MD .  nystatin cream (MYCOSTATIN) 1 application, 1 application, Topical, TID, Houston Siren, MD, 1 application at 03/08/15 0133 .  ondansetron (ZOFRAN) tablet 4 mg, 4 mg, Oral, Q6H PRN **OR** ondansetron (ZOFRAN) injection 4 mg, 4 mg, Intravenous, Q6H PRN, Houston Siren, MD .  ranitidine (ZANTAC) 150 MG/10ML syrup 150 mg, 150 mg, Oral, BID, Houston Siren, MD, 150 mg at 03/08/15 0345 .  Valproic Acid (DEPAKENE) 250 MG/5ML syrup SYRP 500 mg, 500 mg, Per Tube, Daily, Governor Rooks, MD .  Valproic Acid (DEPAKENE) 250 MG/5ML syrup SYRP 500 mg, 500 mg, Per Tube,  Daily, Governor Rooks, MD, 500 mg at 03/08/15 0612 .  Valproic Acid (DEPAKENE) 250 MG/5ML syrup SYRP 500-750 mg, 500-750 mg, Oral, See admin instructions, Houston Siren, MD .  Valproic Acid (DEPAKENE) 250 MG/5ML syrup SYRP 750 mg, 750 mg, Per Tube, Daily, Governor Rooks, MD, 750 mg at 03/07/15 2334 .  vitamin C (ASCORBIC ACID) tablet 250 mg, 250 mg, Oral, Daily, Houston Siren, MD, 250 mg at 03/07/15 2239  IMAGING      MAJOR EVENTS/TEST RESULTS:   INDWELLING DEVICES::  MICRO DATA: MRSA PCR >>neg Urine >> Blood>>gram neg  Resp   ANTIMICROBIALS: fortaz day 2     ASSESSMENT/PLAN   24 yo white male with TBI s/p trach and PEG with acute hypoxic resp failure from acute pneumonia with sepsis with gram negative bacteremia  PULMONARY Pneumonia -plan for BRonch/BAL -continue iv abx  RENAL Follow uo Follow chem 7  GASTROINTESTINAL GI prophyaxis  HEMATOLOGIC Follow CBC, H/h  INFECTIOUS Gram neg bactermia with sepsis-await sens BCx2 gram neg Abx: fortaz day 2  ENDOCRINE - ICU hypoglycemic\Hyperglycemia protocol    NEUROLOGIC -baseline function nonverbal, moans and groans,occasional words -avoid sedatives   I have personally obtained a history, examined the patient, evaluated laboratory and independently reviewed  imaging results, formulated the assessment and plan and placed orders.  The Patient requires high complexity decision making for assessment and support, frequent evaluation and titration of therapies, application of advanced monitoring technologies and extensive interpretation of multiple databases. Critical Care Time devoted to patient care services described in this note is 45 minutes.   Overall, patient is critically ill, prognosis is guarded.    Lucie Leather, M.D.  [facility]  Medical Director [facility] Medical Director [facility] Cardio-Pulmonary Department

## 2015-03-08 NOTE — Consult Note (Signed)
[facility] Infectious Disease     Reason for Consult: Bacteremia     Referring Physician: Gladstone Lighter Date of Admission:  03/07/2015   Active Problems:   Bacteremia   HPI: Roger Tate is a 24 y.o. male with a known history of traumatic brain injury, diabetes, seizures, asthma, status post PEG, status post tracheostomy who was brought to the emergency room 8/27 as he was having some bloody tracheal secretions after accidental disruption of his trach.  He was also having increased sputum production, had a wbc of 13 and possible pna vs atelectasis on cxr. Was treated with IVF and dced on oral levofloxacin. He was called back due to 1 of 2 bcx + for GNR. Since admit cxs now show 1 bottle with coag neg stah, one with a gram neg rod and the other set negative.  WBC on readmit was down to 12 and currently 10.9. Started on IV ceftazidime and azithro pending ID.  He had L sided white out on cxr and had bronch with suctioning of abundant secretions today. Is on vent support through trach currently  Past Medical History  Diagnosis Date  . Brain injury 2002  . Diabetes mellitus without complication   . Seizures   . Asthma    Past Surgical History  Procedure Laterality Date  . Brain surgery  2002  . Tracheostomy tube placement    . Sp perc place gastric tube    . Csf shunt     Social History  Substance Use Topics  . Smoking status: Never Smoker   . Smokeless tobacco: None  . Alcohol Use: No   Family History  Problem Relation Age of Onset  . Bipolar disorder Mother   . Diabetes Father     Allergies:  Allergies  Allergen Reactions  . Sulfa Antibiotics Anaphylaxis  . Zosyn [Piperacillin-Tazobactam In Dex] Anaphylaxis  . Ciprofloxacin Other (See Comments)    HYPOTENSION  . Vancomycin   . Penicillins Rash    Current antibiotics: Antibiotics Given (last 72 hours)    Date/Time Action Medication Dose Rate   03/07/15 2005 Given   azithromycin (ZITHROMAX) 250 mg in dextrose 5 % 125  mL IVPB 250 mg 125 mL/hr   03/07/15 2257 Given   cefTAZidime (FORTAZ) 2 g in dextrose 5 % 50 mL IVPB 2 g 100 mL/hr   03/08/15 0545 Given   cefTAZidime (FORTAZ) 2 g in dextrose 5 % 50 mL IVPB 2 g 100 mL/hr   03/08/15 1440 Given   cefTAZidime (FORTAZ) 2 g in dextrose 5 % 50 mL IVPB 2 g 100 mL/hr      MEDICATIONS: . antiseptic oral rinse  7 mL Mouth Rinse QID  . azithromycin  250 mg Intravenous Q24H  . baclofen  10 mg Oral BID  . budesonide  0.5 mg Inhalation BID  . calcium carbonate  1 tablet Oral Daily  . cefTAZidime (FORTAZ)  IV  2 g Intravenous 3 times per day  . chlorhexidine gluconate  15 mL Mouth Rinse BID  . clotrimazole   Topical BID  . enoxaparin (LOVENOX) injection  40 mg Subcutaneous Q24H  . guaiFENesin-dextromethorphan  10 mL Oral Q12H  . ipratropium-albuterol      . lactulose  20 g Oral BID  . levETIRAcetam  2,250 mg Per Tube BID  . loratadine  10 mg Oral Daily  . multivitamin with minerals   Oral Daily  . nystatin cream  1 application Topical TID  . ranitidine  150 mg Oral BID  .  Valproic Acid  500 mg Per Tube Daily  . Valproic Acid  500 mg Per Tube Daily  . Valproic Acid  500-750 mg Oral See admin instructions  . Valproic Acid  750 mg Per Tube Daily  . vitamin C  250 mg Oral Daily    Review of Systems - unable to obtain   OBJECTIVE: Temp:  [98 F (36.7 C)-100 F (37.8 C)] 99.2 F (37.3 C) (08/29 1410) Pulse Rate:  [92-134] 134 (08/29 1200) Resp:  [17-26] 22 (08/29 1200) BP: (79-115)/(56-83) 112/83 mmHg (08/29 1200) SpO2:  [84 %-100 %] 88 % (08/29 1200) FiO2 (%):  [28 %-85 %] 85 % (08/29 1410) . GENERAL: 24 y.o.-year-old patient lying in the bed in no acute distress. chronically ill apeearing, on vent EYES: Pupils equal, round, reactive to light and accommodation. No scleral icterus. Extraocular muscles intact.  HEENT: Head atraumatic, normocephalic. Oropharynx and nasopharynx clear. No oropharyngeal erythema, moist oral mucosa  NECK: Supple, no  jugular venous distention. No thyroid enlargement, no tenderness. Positive tracheostomy in place LUNGS: rhonchi builaterally CARDIOVASCULAR: S1, S2 RRR.  ABDOMEN: Soft, nontender, nondistended. Bowel sounds present. No organomegaly or mass. Positive PEG tube in place EXTREMITIES: No pedal edema, cyanosis, or clubbing. + 2 pedal & radial pulses b/l. Positive muscular atrophy NEUROLOGIC: Quadriplegic, no focal motor or sensory deficits. PSYCHIATRIC: The patient is alert and oriented x 3.  SKIN: No obvious rash, lesion, or ulcer.   LABS: Results for orders placed or performed during the hospital encounter of 03/07/15 (from the past 48 hour(s))  CBC     Status: Abnormal   Collection Time: 03/07/15  1:15 PM  Result Value Ref Range   WBC 12.2 (H) 3.8 - 10.6 K/uL   RBC 3.98 (L) 4.40 - 5.90 MIL/uL   Hemoglobin 12.8 (L) 13.0 - 18.0 g/dL   HCT 36.4 (L) 40.0 - 52.0 %   MCV 91.4 80.0 - 100.0 fL   MCH 32.1 26.0 - 34.0 pg   MCHC 35.1 32.0 - 36.0 g/dL   RDW 13.8 11.5 - 14.5 %   Platelets 203 150 - 440 K/uL  Basic metabolic panel     Status: Abnormal   Collection Time: 03/07/15  1:15 PM  Result Value Ref Range   Sodium 131 (L) 135 - 145 mmol/L   Potassium 4.3 3.5 - 5.1 mmol/L   Chloride 100 (L) 101 - 111 mmol/L   CO2 22 22 - 32 mmol/L   Glucose, Bld 128 (H) 65 - 99 mg/dL   BUN <5 (L) 6 - 20 mg/dL   Creatinine, Ser 0.45 (L) 0.61 - 1.24 mg/dL   Calcium 9.1 8.9 - 10.3 mg/dL   GFR calc non Af Amer >60 >60 mL/min   GFR calc Af Amer >60 >60 mL/min    Comment: (NOTE) The eGFR has been calculated using the CKD EPI equation. This calculation has not been validated in all clinical situations. eGFR's persistently <60 mL/min signify possible Chronic Kidney Disease.    Anion gap 9 5 - 15  Blood culture (routine x 2)     Status: None (Preliminary result)   Collection Time: 03/07/15  3:00 PM  Result Value Ref Range   Specimen Description BLOOD LEFT ASSIST CONTROL    Special Requests      BOTTLES  DRAWN AEROBIC AND ANAEROBIC  AER 5CC ANA 2CC   Culture NO GROWTH < 24 HOURS    Report Status PENDING   Blood culture (routine x 2)     Status:  None (Preliminary result)   Collection Time: 03/07/15  4:42 PM  Result Value Ref Range   Specimen Description BLOOD LEFT ARM    Special Requests BOTTLES DRAWN AEROBIC AND ANAEROBIC  4CC    Culture NO GROWTH < 24 HOURS    Report Status PENDING   Urinalysis complete, with microscopic ([facility] only)     Status: Abnormal   Collection Time: 03/07/15  6:13 PM  Result Value Ref Range   Color, Urine YELLOW (A) YELLOW   APPearance CLEAR (A) CLEAR   Glucose, UA NEGATIVE NEGATIVE mg/dL   Bilirubin Urine NEGATIVE NEGATIVE   Ketones, ur NEGATIVE NEGATIVE mg/dL   Specific Gravity, Urine 1.012 1.005 - 1.030   Hgb urine dipstick NEGATIVE NEGATIVE   pH 6.0 5.0 - 8.0   Protein, ur NEGATIVE NEGATIVE mg/dL   Nitrite NEGATIVE NEGATIVE   Leukocytes, UA NEGATIVE NEGATIVE   RBC / HPF 0-5 0 - 5 RBC/hpf   WBC, UA 0-5 0 - 5 WBC/hpf   Bacteria, UA NONE SEEN NONE SEEN   Squamous Epithelial / LPF NONE SEEN NONE SEEN   Mucous PRESENT   Urine culture     Status: None   Collection Time: 03/07/15  6:13 PM  Result Value Ref Range   Specimen Description URINE, CLEAN CATCH    Special Requests NONE    Culture NO GROWTH    Report Status 03/08/2015 FINAL   MRSA PCR Screening     Status: None   Collection Time: 03/07/15  7:43 PM  Result Value Ref Range   MRSA by PCR NEGATIVE NEGATIVE    Comment:        The GeneXpert MRSA Assay (FDA approved for NASAL specimens only), is one component of a comprehensive MRSA colonization surveillance program. It is not intended to diagnose MRSA infection nor to guide or monitor treatment for MRSA infections.   Glucose, capillary     Status: Abnormal   Collection Time: 03/08/15 12:23 AM  Result Value Ref Range   Glucose-Capillary 244 (H) 65 - 99 mg/dL  Basic metabolic panel     Status: Abnormal   Collection Time: 03/08/15  4:43 AM   Result Value Ref Range   Sodium 136 135 - 145 mmol/L   Potassium 4.0 3.5 - 5.1 mmol/L   Chloride 104 101 - 111 mmol/L   CO2 25 22 - 32 mmol/L   Glucose, Bld 111 (H) 65 - 99 mg/dL   BUN 5 (L) 6 - 20 mg/dL   Creatinine, Ser 0.39 (L) 0.61 - 1.24 mg/dL   Calcium 8.8 (L) 8.9 - 10.3 mg/dL   GFR calc non Af Amer >60 >60 mL/min   GFR calc Af Amer >60 >60 mL/min    Comment: (NOTE) The eGFR has been calculated using the CKD EPI equation. This calculation has not been validated in all clinical situations. eGFR's persistently <60 mL/min signify possible Chronic Kidney Disease.    Anion gap 7 5 - 15  CBC     Status: Abnormal   Collection Time: 03/08/15  4:43 AM  Result Value Ref Range   WBC 10.9 (H) 3.8 - 10.6 K/uL   RBC 4.02 (L) 4.40 - 5.90 MIL/uL   Hemoglobin 13.0 13.0 - 18.0 g/dL   HCT 37.3 (L) 40.0 - 52.0 %   MCV 92.8 80.0 - 100.0 fL   MCH 32.3 26.0 - 34.0 pg   MCHC 34.8 32.0 - 36.0 g/dL   RDW 14.2 11.5 - 14.5 %   Platelets 220 150 -  440 K/uL  Blood gas, arterial     Status: Abnormal   Collection Time: 03/08/15  6:50 AM  Result Value Ref Range   FIO2 0.65    Delivery systems VENTILATOR    VT 370 mL   Peep/cpap 5.0 cm H20   pH, Arterial 7.43 7.350 - 7.450   pCO2 arterial 31 (L) 32.0 - 48.0 mmHg   pO2, Arterial 127 (H) 83.0 - 108.0 mmHg   Bicarbonate 20.6 (L) 21.0 - 28.0 mEq/L   Acid-base deficit 2.7 (H) 0.0 - 2.0 mmol/L   O2 Saturation 99.0 %   Patient temperature 37.0    Collection site RIGHT RADIAL    Sample type ARTERIAL DRAW    Allens test (pass/fail) PASS PASS   Mechanical Rate 10   Glucose, capillary     Status: Abnormal   Collection Time: 03/08/15  8:35 AM  Result Value Ref Range   Glucose-Capillary 149 (H) 65 - 99 mg/dL   No components found for: ESR, C REACTIVE PROTEIN MICRO: Recent Results (from the past 720 hour(s))  Blood culture (routine x 2)     Status: None (Preliminary result)   Collection Time: 03/06/15 10:39 AM  Result Value Ref Range Status    Specimen Description BLOOD RIGHT HAND  Final   Special Requests BOTTLES DRAWN AEROBIC AND ANAEROBIC  1CC  Final   Culture NO GROWTH 2 DAYS  Final   Report Status PENDING  Incomplete  Blood culture (routine x 2)     Status: None (Preliminary result)   Collection Time: 03/06/15 11:47 AM  Result Value Ref Range Status   Specimen Description BLOOD RIGHT HAND  Final   Special Requests BOTTLES DRAWN AEROBIC AND ANAEROBIC 1CC  Final   Culture  Setup Time   Final    GRAM POSITIVE COCCI ANAEROBIC BOTTLE ONLY CRITICAL RESULT CALLED TO, READ BACK BY AND VERIFIED WITH: JOANNA LINDSAY AT 8270 03/08/15 DV GRAM NEGATIVE RODS AEROBIC BOTTLE ONLY CRITICAL RESULT CALLED TO, READ BACK BY AND VERIFIED WITH: RACHEL HAYDEN AT [place] ON 03/07/15 RWW    Culture   Final    GRAM NEGATIVE RODS AEROBIC BOTTLE ONLY COAGULASE NEGATIVE STAPHYLOCOCCUS ANAEROBIC BOTTLE ONLY IDENTIFICATION AND SUSCEPTIBILITIES TO FOLLOW    Report Status PENDING  Incomplete  Blood culture (routine x 2)     Status: None (Preliminary result)   Collection Time: 03/07/15  3:00 PM  Result Value Ref Range Status   Specimen Description BLOOD LEFT ASSIST CONTROL  Final   Special Requests   Final    BOTTLES DRAWN AEROBIC AND ANAEROBIC  AER 5CC ANA 2CC   Culture NO GROWTH < 24 HOURS  Final   Report Status PENDING  Incomplete  Blood culture (routine x 2)     Status: None (Preliminary result)   Collection Time: 03/07/15  4:42 PM  Result Value Ref Range Status   Specimen Description BLOOD LEFT ARM  Final   Special Requests BOTTLES DRAWN AEROBIC AND ANAEROBIC  4CC  Final   Culture NO GROWTH < 24 HOURS  Final   Report Status PENDING  Incomplete  Urine culture     Status: None   Collection Time: 03/07/15  6:13 PM  Result Value Ref Range Status   Specimen Description URINE, CLEAN CATCH  Final   Special Requests NONE  Final   Culture NO GROWTH  Final   Report Status 03/08/2015 FINAL  Final  MRSA PCR Screening     Status: None   Collection  Time: 03/07/15  7:43 PM  Result Value Ref Range Status   MRSA by PCR NEGATIVE NEGATIVE Final    Comment:        The GeneXpert MRSA Assay (FDA approved for NASAL specimens only), is one component of a comprehensive MRSA colonization surveillance program. It is not intended to diagnose MRSA infection nor to guide or monitor treatment for MRSA infections.     IMAGING: Dg Chest Portable 1 View  03/06/2015   CLINICAL DATA:  Pt arrived via EMS from home with family. Mother reports patient was seen by a home health agency on Monday and they were changing his clothes and pulled out the trach. Mother states that he has had trouble coming off the vent this week. A few days ago, red blood has been seen with suctioning which is concerning to family and other home health agency.  EXAM: PORTABLE CHEST - 1 VIEW  COMPARISON:  01/18/2015  FINDINGS: The left lung is now mostly opacified. Right lung remains clear. There is some apparent volume loss on the left suggesting that at least a component of the left lung opacity is due to atelectasis.  No pleural effusion or pneumothorax. The cardiac silhouette is well defined on this study.  Tracheostomy appears well positioned.  IMPRESSION: 1. New opacity throughout most of the left lung. This could all reflect atelectasis. This could be due to pneumonia or a combination. Clear right lung.   Electronically Signed   By: Lajean Manes M.D.   On: 03/06/2015 10:30    Assessment:   Halo Laski is a 24 y.o. male with TBI, home trach admtted with bloody sputum, Gram neg bacteremia, and white out lung on L s/p suctioning and Bronch 8/29.  Currently on ceftazidime.   Recommendations Continue ceftazidime pending further ID of bcx GNR and of bronch cultures Would add flagyl for anaerobic coverage Further recs based on culture results.  Thank you very much for allowing me to participate in the care of this patient. Please call with questions.   Cheral Marker. Ola Spurr,  MD

## 2015-03-08 NOTE — Progress Notes (Signed)
[facility] [name] at [facility]   PATIENT NAME: Roger Tate    MR#:  [id number]  DATE OF BIRTH:  1991/02/10  SUBJECTIVE:  CHIEF COMPLAINT:   Chief Complaint  Patient presents with  . Blood Infection   - Patient is s/p TBI, bed bound at baseline, parents at bedside. Was from ER for positive blood cultures, pneumonia on CXR, increased trach secretions - for bronch this afternoon -hypoxic, now on 65% fio2  REVIEW OF SYSTEMS:  Review of Systems  Unable to perform ROS: critical illness    DRUG ALLERGIES:   Allergies  Allergen Reactions  . Sulfa Antibiotics Anaphylaxis  . Zosyn [Piperacillin-Tazobactam In Dex] Anaphylaxis  . Ciprofloxacin Other (See Comments)    HYPOTENSION  . Vancomycin   . Penicillins Rash    VITALS:  Blood pressure 106/66, pulse 105, temperature 98.7 F (37.1 C), temperature source Axillary, resp. rate 18, height 5\' 5"  (1.651 m), weight 73.029 kg (161 lb), SpO2 89 %.  PHYSICAL EXAMINATION:  Physical Exam  GENERAL:  24 y.o.-year-old patient lying in the bed with no acute distress. Sleeping, family requested not to wake him up. EYES: Pupils equal, round, reactive to light and accommodation. No scleral icterus.  HEENT: Head atraumatic, normocephalic. Oropharynx and nasopharynx clear.  NECK:  Supple, no jugular venous distention. No thyroid enlargement, no tenderness.  Trach in place. LUNGS: Coarse breath sounds bilaterally with rhonchi, worse on left side. no wheezing, rales, or crepitation. No use of accessory muscles of respiration.  CARDIOVASCULAR: S1, S2 normal. No murmurs, rubs, or gallops.  ABDOMEN: Soft, nontender, nondistended. Bowel sounds present. No organomegaly or mass.  EXTREMITIES: 2+ pedal edema, No cyanosis, or clubbing. Atrophic extremities NEUROLOGIC: Unable to do neuro exam, as patient sleeping. No facial droop, alert and interactive at baseline per family. Quadriplegic and bed bound. PSYCHIATRIC: The  patient is sleeping this morning  SKIN: No obvious rash, lesion, or ulcer.    LABORATORY PANEL:   CBC  Recent Labs Lab 03/08/15 0443  WBC 10.9*  HGB 13.0  HCT 37.3*  PLT 220   ------------------------------------------------------------------------------------------------------------------  Chemistries   Recent Labs Lab 03/08/15 0443  NA 136  K 4.0  CL 104  CO2 25  GLUCOSE 111*  BUN 5*  CREATININE 0.39*  CALCIUM 8.8*   ------------------------------------------------------------------------------------------------------------------  Cardiac Enzymes No results for input(s): TROPONINI in the last 168 hours. ------------------------------------------------------------------------------------------------------------------  RADIOLOGY:  No results found.  EKG:   Orders placed or performed during the hospital encounter of 01/14/15  . EKG 12-Lead  . EKG 12-Lead  . 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  1. Sepsis-tachycardia and temp at home - positive blood cultures- gram negative bacteremia- from 03/06/15 - discharged on levaquin, repeat blood cultures from 03/07/15 so far are negative - bronch and lavage and cultures today - ID consulted - on cefepime for now - UA with no infection  2. Acute hypoxic respiratory failure--secondary to pneumonia, left sided- ? Collapse lung - mucus plug or aspiration - s/p chronic trach, now on 65% fio2 - appreciate pulmonary consult - possible bronch this afternoon - cont vent support.  3. TBI and h/o seizure disorder- on keppra and depakote Also on baclofen for muscle spasms.  4. DM-feeds by mouth normally. Cont  home dose of as needed novolog  5. Anxiety-cont home meds  6. DVT  prophylaxis- lovenox  Parents at bedside, they want to take patient home as soon as possible- they were hoping to be today. Dr. Belia Heman will do bronch later today and I called micro for ID and sensitivities of the organism- they said it would be back tomorrow AM  All the records are reviewed and case discussed with Care Management/Social Workerr. Management plans discussed with the patient, family and they are in agreement.  CODE STATUS: FULL CODE  TOTAL CRITICAL CARE TIME SPENT IN TAKING CARE OF THIS PATIENT: 45 minutes.   POSSIBLE D/C IN 1 DAY, DEPENDING ON CLINICAL CONDITION.   Deavin Forst M.D on 03/08/2015 at 1:19 PM  Between 7am to 6pm - Pager - [phone]  After 6pm go to [email or web address] - password [email or web address] [facility]  [facility]  Office  [phone]  CC: Primary care physician; Dorothey Baseman, MD

## 2015-03-08 NOTE — Progress Notes (Signed)
Patient with low o2 sats. ABG obtained without difficulty. Suctioned and lavaged patient x3 for moderate amount of yellow thick secretions. 100% o2 with suction. After receiving results of abg decreased o2 back to 65%. See abg results. Patient tolerated all well.

## 2015-03-08 NOTE — Procedures (Signed)
PROCEDURE: BRONCHOSCOPY Therapeutic Aspiration of Tracheobronchial Tree  PROCEDURE DATE: 03/08/2015  TIME:  NAME:  Roger Tate  DOB:[birth year]  MRN: [id number] LOC:  IC18A/IC18A-AA    HOSP DAY: @ CODE STATUS:      Code Status Orders        Start     Ordered   03/07/15 1910  Full code   Continuous     03/07/15 1909          Indications/Preliminary Diagnosis:   Consent: (Place X beside choice/s below)  The benefits, risks and possible complications of the procedure were        explained to:  ___ patient  _x__ patient's family  ___ other:___________  who verbalized understanding and gave:  ___ verbal  ___ written  _x__ verbal and written  ___ telephone  ___ other:________ consent.      Unable to obtain consent; procedure performed on emergent basis.     Other:       PRESEDATION ASSESSMENT: History and Physical has been performed. Patient meds and allergies have been reviewed. Presedation airway examination has been performed and documented. Baseline vital signs, sedation score, oxygenation status, and cardiac rhythm were reviewed. Patient was deemed to be in satisfactory condition to undergo the procedure.        PROCEDURE DETAILS: Timeout performed and correct patient, name, & ID confirmed. Following prep per Pulmonary policy, appropriate sedation was administered. The Bronchoscope was inserted in to oral cavity with bite block in place. Therapeutic aspiration of Tracheobronchial tree was performed.  Airway exam proceeded with findings, technical procedures, and specimen collection as noted below. At the end of exam the scope was withdrawn without incident. Impression and Plan as noted below.           Airway Prep (Place X beside choice below)   1% Transtracheal Lidocaine Anesthetization 7 cc   Patient prepped per Bronchoscopy Lab Policy       Insertion Route (Place X beside choice below)   Nasal   Oral   Endotracheal Tube  x  Tracheostomy   INTRAPROCEDURE MEDICATIONS:  Sedative/Narcotic Amt Dose   Versed  mg   Fentanyl  mcg  Diprivan  mg       Medication Amt Dose  Medication Amt Dose  Lidocaine 1%  cc  Epinephrine 1:10,000 sol  cc  Xylocaine 4%  cc  Cocaine  cc   TECHNICAL PROCEDURES: (Place X beside choice below)   Procedures  Description    None     Electrocautery     Cryotherapy     Balloon Dilatation     Bronchography     Stent Placement   x  Therapeutic Aspiration Extensive amounts of purulant secretions form left lung    Laser/Argon Plasma    Brachytherapy Catheter Placement    Foreign Body Removal         SPECIMENS (Sites): (Place X beside choice below)  Specimens Description   No Specimens Obtained     Washings   x Lavage Left Main stem   Biopsies    Fine Needle Aspirates    Brushings    Sputum    FINDINGS: extensive amounst fo purulant secretions ESTIMATED BLOOD LOSS: none COMPLICATIONS/RESOLUTION: patient with worsening hypoxia -will need vent support     IMPRESSION:POST-PROCEDURE DX: pneumonia    RECOMMENDATION/PLAN: follow up BAL -will need repeat Bronc at some point     Lucie Leather, M.D.  [facility]  Medical Director [facility]  Medical Director [place] Department

## 2015-03-08 NOTE — Care Management Note (Signed)
Case Management Note  Patient Details  Name: Roger Tate MRN: [id number] Date of Birth: [birth year]  Subjective/Objective:    TBI in with bacterial infection. Blood cultures positive.                 Action/Plan: Attempted to speak with family and nurse states they had just stepped out. Broch today with possible discharge in the next 1-2 days. On IV antibiotics. Patient has a #7 trach and uses a vent at home. He has a Dentist for medications and flushes. [facility] provides private duty nursing. WiIl clarify with family as to services and follow up  Expected Discharge Date:                  Expected Discharge Plan:  Home w Home Health Services  In-House Referral:     Discharge planning Services  CM Consult  Post Acute Care Choice:    Choice offered to:     DME Arranged:    DME Agency:     HH Arranged:    HH Agency:     Status of Service:  In process, will continue to follow  Medicare Important Message Given:    Date Medicare IM Given:    Medicare IM give by:    Date Additional Medicare IM Given:    Additional Medicare Important Message give by:     If discussed at Long Length of Stay Meetings, dates discussed:    Additional Comments:  Marily Memos, RN 03/08/2015, 3:11 PM

## 2015-03-08 NOTE — Progress Notes (Signed)
Pt. Placed on vent during bronchoscopy for desaturations.

## 2015-03-08 NOTE — Progress Notes (Addendum)
This RN called pt's dad to update on changes in vent support, message left, nursing will cont to monitor Update: [name] dad aware of changes in vent management

## 2015-03-08 NOTE — Progress Notes (Signed)
ANTIBIOTIC CONSULT NOTE - INITIAL  Pharmacy Consult for Ceftazidime Indication: sepsis  Allergies  Allergen Reactions  . Sulfa Antibiotics Anaphylaxis  . Zosyn [Piperacillin-Tazobactam In Dex] Anaphylaxis  . Ciprofloxacin Other (See Comments)    HYPOTENSION  . Vancomycin   . Penicillins Rash    Patient Measurements: Height: 5\' 5"  (165.1 cm) Weight: 161 lb (73.029 kg) IBW/kg (Calculated) : 61.5 Adjusted Body Weight:   Vital Signs: Temp: 98.7 F (37.1 C) (08/29 0600) Temp Source: Axillary (08/29 0600) BP: 106/66 mmHg (08/29 0800) Pulse Rate: 105 (08/29 0800) Intake/Output from previous day: 08/28 0701 - 08/29 0700 In: 915 [P.O.:240; IV Piggyback:225] Out: 300 [Urine:300] Intake/Output from this shift:    Labs:  Recent Labs  03/06/15 1035 03/07/15 1315 03/08/15 0443  WBC 13.2* 12.2* 10.9*  HGB 13.2 12.8* 13.0  PLT 208 203 220  CREATININE 0.37* 0.45* 0.39*   Estimated Creatinine Clearance: 126 mL/min (by C-G formula based on Cr of 0.39). No results for input(s): VANCOTROUGH, VANCOPEAK, VANCORANDOM, GENTTROUGH, GENTPEAK, GENTRANDOM, TOBRATROUGH, TOBRAPEAK, TOBRARND, AMIKACINPEAK, AMIKACINTROU, AMIKACIN in the last 72 hours.   Microbiology: Recent Results (from the past 720 hour(s))  Blood culture (routine x 2)     Status: None (Preliminary result)   Collection Time: 03/06/15 10:39 AM  Result Value Ref Range Status   Specimen Description BLOOD RIGHT HAND  Final   Special Requests BOTTLES DRAWN AEROBIC AND ANAEROBIC  1CC  Final   Culture NO GROWTH 2 DAYS  Final   Report Status PENDING  Incomplete  Blood culture (routine x 2)     Status: None (Preliminary result)   Collection Time: 03/06/15 11:47 AM  Result Value Ref Range Status   Specimen Description BLOOD RIGHT HAND  Final   Special Requests BOTTLES DRAWN AEROBIC AND ANAEROBIC 1CC  Final   Culture  Setup Time   Final    GRAM POSITIVE COCCI ANAEROBIC BOTTLE ONLY CRITICAL RESULT CALLED TO, READ BACK BY AND  VERIFIED WITH: JOANNA LINDSAY AT 0755 03/08/15 DV GRAM NEGATIVE RODS AEROBIC BOTTLE ONLY CRITICAL RESULT CALLED TO, READ BACK BY AND VERIFIED WITH: RACHEL HAYDEN AT 0536 ON 03/07/15 RWW    Culture   Final    GRAM NEGATIVE RODS AEROBIC BOTTLE ONLY COAGULASE NEGATIVE STAPHYLOCOCCUS ANAEROBIC BOTTLE ONLY IDENTIFICATION AND SUSCEPTIBILITIES TO FOLLOW    Report Status PENDING  Incomplete  Blood culture (routine x 2)     Status: None (Preliminary result)   Collection Time: 03/07/15  3:00 PM  Result Value Ref Range Status   Specimen Description BLOOD LEFT ASSIST CONTROL  Final   Special Requests   Final    BOTTLES DRAWN AEROBIC AND ANAEROBIC  AER 5CC ANA 2CC   Culture NO GROWTH < 24 HOURS  Final   Report Status PENDING  Incomplete  Blood culture (routine x 2)     Status: None (Preliminary result)   Collection Time: 03/07/15  4:42 PM  Result Value Ref Range Status   Specimen Description BLOOD LEFT ARM  Final   Special Requests BOTTLES DRAWN AEROBIC AND ANAEROBIC  4CC  Final   Culture NO GROWTH < 24 HOURS  Final   Report Status PENDING  Incomplete  Urine culture     Status: None   Collection Time: 03/07/15  6:13 PM  Result Value Ref Range Status   Specimen Description URINE, CLEAN CATCH  Final   Special Requests NONE  Final   Culture NO GROWTH  Final   Report Status 03/08/2015 FINAL  Final  MRSA  PCR Screening     Status: None   Collection Time: 03/07/15  7:43 PM  Result Value Ref Range Status   MRSA by PCR NEGATIVE NEGATIVE Final    Comment:        The GeneXpert MRSA Assay (FDA approved for NASAL specimens only), is one component of a comprehensive MRSA colonization surveillance program. It is not intended to diagnose MRSA infection nor to guide or monitor treatment for MRSA infections.     Medical History: Past Medical History  Diagnosis Date  . Brain injury 2002  . Diabetes mellitus without complication   . Seizures   . Asthma     Medications:  Prescriptions prior  to admission  Medication Sig Dispense Refill Last Dose  . ACCU-CHEK SMARTVIEW test strip Test 4 times a day (before each meal and at bedtime).  11 03/06/2015 at Unknown time  . albuterol (PROVENTIL) (2.5 MG/3ML) 0.083% nebulizer solution Inhale 3 mLs into the lungs 4 (four) times daily as needed.   03/06/2015 at Unknown time  . ascorbic acid (VITAMIN C) 250 MG CHEW Chew 250 mg by mouth daily.   03/06/2015 at Unknown time  . baclofen (LIORESAL) 10 MG tablet Take 1 tablet by mouth 2 (two) times daily.   03/06/2015 at Unknown time  . calcium carbonate (TUMS EX) 750 MG chewable tablet Chew 1 tablet by mouth daily.   03/06/2015 at Unknown time  . cetirizine (ZYRTEC) 10 MG tablet Take 10 mg by mouth daily as needed for allergies or rhinitis.    03/06/2015 at Unknown time  . clonazePAM (KLONOPIN) 1 MG tablet Take 1 mg by mouth as needed for anxiety (for 3 or more seizures within an hour.).    prn  . Dextromethorphan-Guaifenesin (GUAIFENESIN DM) 400-20 MG TABS Take 1 tablet by mouth every 12 (twelve) hours.   prn  . diphenhydrAMINE (BENADRYL) 25 mg capsule Take 25 mg by mouth daily as needed (to pretreat antibiotics.).    03/06/2015 at Unknown time  . EPINEPHrine (EPIPEN JR) 0.15 MG/0.3ML injection Inject 0.15 mg into the muscle as needed.   prn  . FIBER PO Take 2 tablets by mouth daily.    03/06/2015 at Unknown time  . glucagon (GLUCAGON EMERGENCY) 1 MG injection Inject 1 mg into the muscle as needed.   prn  . insulin aspart (NOVOLOG) 100 UNIT/ML injection Inject 2 Units into the skin as needed for high blood sugar. Prn before meals for blood glucose greater than 200   prn  . ketoconazole (NIZORAL) 2 % cream Apply 1 application topically 2 (two) times daily as needed for irritation.    prn  . lactulose (CHRONULAC) 10 GM/15ML solution Take 30 mLs by mouth 2 (two) times daily.   03/06/2015 at Unknown time  . levETIRAcetam (KEPPRA) 100 MG/ML solution Place 2,250 mg into feeding tube 2 (two) times daily.  0 03/06/2015  at Unknown time  . levofloxacin (LEVAQUIN) 750 MG tablet Take 1 tablet (750 mg total) by mouth daily. 10 tablet 0 03/07/2015 at Unknown time  . Multiple Vitamin (MULTI-VITAMINS PO) Take 2 each by mouth daily. Take gummies.   03/06/2015 at Unknown time  . nystatin cream (MYCOSTATIN) Apply 1 application topically 3 (three) times daily as needed. For rash.  1 prn  . OXYGEN Inhale 1-2 L into the lungs at bedtime.   prn  . PULMICORT 0.5 MG/2ML nebulizer solution Inhale 1 vial into the lungs 2 (two) times daily.  3 03/06/2015 at Unknown time  . ranitidine (  ZANTAC) 75 MG/5ML syrup Take 10 mLs by mouth 2 (two) times daily.  4 03/06/2015 at Unknown time  . Valproic Acid (DEPAKENE) 250 MG/5ML SYRP syrup Take 10-15 mLs by mouth See admin instructions.  Take 10 mls orally twice daily (5am, 3pm) and 15 mls orally nightly at 11pm.  4 03/06/2015 at Unknown time  . bisacodyl (LAXATIVE) 10 MG suppository Place 1 suppository rectally at bedtime as needed for mild constipation, moderate constipation or severe constipation.    prn  . cefUROXime (CEFTIN) 500 MG tablet Take 1 tablet (500 mg total) by mouth 2 (two) times daily with a meal. 10 tablet 0   . levETIRAcetam (KEPPRA) 250 MG tablet Take 2 tablets (500 mg total) by mouth 2 (two) times daily. 60 tablet 0   . linezolid (ZYVOX) 600 MG tablet Take 1 tablet (600 mg total) by mouth 2 (two) times daily. 10 tablet 0    Assessment: 24 y/o with a h/o TBI on empiric abx for sepsis. Pt growing GNR and CNS in 1/2 blood cx.  Pt allergic to PCN (anaphylaxis to zosyn) and vancomycin.   Plan:  Will continue Ceftazidime 2 gm IV Q8H .  Luisa Hart D 03/08/2015,1:33 PM

## 2015-03-08 NOTE — Evaluation (Signed)
Clinical/Bedside Swallow Evaluation Patient Details  Name: Roger Tate MRN: [id number] Date of Birth: [birth year]  Today's Date: 03/08/2015 Time: SLP Start Time (ACUTE ONLY): 0900 SLP Stop Time (ACUTE ONLY): 1000 SLP Time Calculation (min) (ACUTE ONLY): 60 min  Past Medical History:  Past Medical History  Diagnosis Date  . Brain injury 2002  . Diabetes mellitus without complication   . Seizures   . Asthma    Past Surgical History:  Past Surgical History  Procedure Laterality Date  . Brain surgery  2002  . Tracheostomy tube placement    . Sp perc place gastric tube    . Csf shunt     HPI:      Assessment / Plan / Recommendation Clinical Impression  Pt appears to present w/ increased risk for aspiration sec. to his declined respiratory support and inability to cough and protect/clear his airway. Pt does present w/ increased tracheal secretions at this time and is requiring increased tracheal suctioning per NSG and RT. Pt appeared to tolerate small, single sips of thin liquids via straw w/ Mother w/out immediate decline in status(medical and respiratory wise). However, pt remains at risk for aspiration; parents have acknowledged such. MD agreed w/ trial of meal(s) today to determine next step in further assessment such as MBSS. NSG informed; Mother agreed. ST will f/u tomorrow.     Aspiration Risk  Moderate    Diet Recommendation Age appropriate regular solids;Thin (per parent request and ok; monitoring of such)   Medication Administration: Crushed with puree (as able) Compensations: Slow rate;Small sips/bites;Minimize environmental distractions;Check for pocketing;Multiple dry swallows after each bite/sip    Other  Recommendations Recommended Consults:  (Dietician following) Oral Care Recommendations: Oral care before and after PO;Staff/trained caregiver to provide oral care Other Recommendations:  (TBD)   Follow Up Recommendations       Frequency and Duration min 3x week   1 week   Pertinent Vitals/Pain None noted during session    SLP Swallow Goals  see care plan   Swallow Study Prior Functional Status       General Date of Onset: 03/07/15    Oral/Motor/Sensory Function     Ice Chips     Thin Liquid      Nectar Thick     Honey Thick     Puree     Solid   GO           Roger Som, MS, CCC-SLP  Roger Tate 03/08/2015,4:06 PM

## 2015-03-08 NOTE — Progress Notes (Signed)
Initial Nutrition Assessment     INTERVENTION:  Coordination of care: Discussed in ICU rounds, SLP planning to continue current consistency diet at this time.  Pt uses PEG for flushes and medications, no feeding prior to admission. Recommend at least 30ml q 6 hr free water flush to keep tube patent, discussed with RN, [employer] and snacks: Cater to pt preferences  Nutrition Supplement: Will add magic cup for added nutrition BID   NUTRITION DIAGNOSIS:   Increased nutrient needs related to acute illness as evidenced by estimated needs.    GOAL:   Patient will meet greater than or equal to 90% of their needs    MONITOR:    (Energy intake, Digestive system, Electrolyte and renal profile)  REASON FOR ASSESSMENT:   Other (Comment) (trach, PEG)    ASSESSMENT:      Pt admitted with shortness of breath, bactermia, pneumonia  Past Medical History  Diagnosis Date  . Brain injury 2002  . Diabetes mellitus without complication   . Seizures   . Asthma     Current Nutrition: regular with thin liquids. Ate 1/2 pancake and drank juice this am per RN, Dois Davenport  Food/Nutrition-Related History: eats normally at home prior to admission with frequent suctioning   Medications: MVI, vit c, lasix, aspart, lactulose  Electrolyte/Renal Profile and Glucose Profile:   Recent Labs Lab 03/06/15 1035 03/07/15 1315 03/08/15 0443  NA 125* 131* 136  K 3.7 4.3 4.0  CL 94* 100* 104  CO2 20* 22 25  BUN <5* <5* 5*  CREATININE 0.37* 0.45* 0.39*  CALCIUM 8.8* 9.1 8.8*  GLUCOSE 134* 128* 111*    Gastrointestinal Profile:abdomen taut, firm Last BM:PTA   Nutrition-Focused Physical Exam Findings:  Unable to complete Nutrition-Focused physical exam at this time.     Weight Change: reviewed wt trends     Diet Order:  Diet regular Room service appropriate?: Yes; Fluid consistency:: Thin  Skin:   reviewed  Height:   Ht Readings from Last 1 Encounters:  03/07/15  (1.651  m)    Weight:   Wt Readings from Last 1 Encounters:  03/07/15 161 lb (73.029 kg)         BMI:  Body mass index is 26.79 kg/(m^2).  Estimated Nutritional Needs:   Kcal:  BEE 1656 kcals (IF 1.1-1.2, AF 1.2) 9147-8295 kcals/d.   Protein:  (1.1-1.3 g/kg) 80-95 g/d  Fluid:  (30-35 g/kg) 6213-0865 ml/d  EDUCATION NEEDS:   No education needs identified at this time  MODERATE Care Level  Mady Oubre B. Freida Busman, RD, LDN [phone] (pager)

## 2015-03-08 NOTE — Progress Notes (Signed)
Fio2 has been increased throughout the shift due to pt sat reading in the low to mid 80's. Frequent suction per family member has occurred will small to moderate amounts of suctions.

## 2015-03-08 NOTE — Progress Notes (Signed)
Patient and mother at bedside resting intermittently overnight, difficulty obtaining pulse ox overnight with expressed concern from mother.  Patient previously received lasix 40 mg IV with noted results. ABG performed PO2 127, mother updated on condition and plan. Attempts made to reassure mother as she has had many concerns prior to and since admission.  Questions answered as asked POC discussed, see CHL for further update. Will continue to assess for changes/need.

## 2015-03-09 ENCOUNTER — Inpatient Hospital Stay: Payer: Medicaid Other

## 2015-03-09 DIAGNOSIS — J9621 Acute and chronic respiratory failure with hypoxia: Secondary | ICD-10-CM

## 2015-03-09 LAB — BLOOD GAS, ARTERIAL
ACID-BASE DEFICIT: 2.7 mmol/L — AB (ref 0.0–2.0)
ALLENS TEST (PASS/FAIL): POSITIVE — AB
Acid-Base Excess: 2.4 mmol/L (ref 0.0–3.0)
Allens test (pass/fail): POSITIVE — AB
BICARBONATE: 22.6 meq/L (ref 21.0–28.0)
BICARBONATE: 27.2 meq/L (ref 21.0–28.0)
FIO2: 0.85
FIO2: 80
LHR: 15 {breaths}/min
MECHVT: 400 mL
MECHVT: 550 mL
O2 SAT: 81.6 %
O2 SAT: 74.8 %
PCO2 ART: 40 mmHg (ref 32.0–48.0)
PEEP/CPAP: 15 cmH2O
PEEP/CPAP: 12 cmH2O
PH ART: 7.36 (ref 7.350–7.450)
PO2 ART: 39 mmHg — AB (ref 83.0–108.0)
Patient temperature: 37
Patient temperature: 37
RATE: 15 resp/min
pCO2 arterial: 42 mmHg (ref 32.0–48.0)
pH, Arterial: 7.42 (ref 7.350–7.450)
pO2, Arterial: 48 mmHg — ABNORMAL LOW (ref 83.0–108.0)

## 2015-03-09 LAB — CULTURE, BLOOD (ROUTINE X 2)

## 2015-03-09 LAB — BASIC METABOLIC PANEL
ANION GAP: 8 (ref 5–15)
BUN: 7 mg/dL (ref 6–20)
CO2: 25 mmol/L (ref 22–32)
Calcium: 9.2 mg/dL (ref 8.9–10.3)
Chloride: 104 mmol/L (ref 101–111)
Creatinine, Ser: 0.49 mg/dL — ABNORMAL LOW (ref 0.61–1.24)
GLUCOSE: 159 mg/dL — AB (ref 65–99)
POTASSIUM: 4 mmol/L (ref 3.5–5.1)
Sodium: 137 mmol/L (ref 135–145)

## 2015-03-09 MED ORDER — DIAZEPAM 5 MG/ML IJ SOLN: 5.0000 mg | INTRAMUSCULAR | Status: DC | PRN

## 2015-03-09 MED ORDER — RANITIDINE HCL 150 MG/10ML PO SYRP
150.0000 mg | ORAL_SOLUTION | Freq: Two times a day (BID) | ORAL | Status: DC
  Administered 2015-03-09 – 2015-03-18 (×18): 150 mg
  Filled 2015-03-09 (×21): qty 10

## 2015-03-09 MED ORDER — LORAZEPAM 2 MG/ML IJ SOLN: 2.0000 mg | INTRAMUSCULAR | Status: DC

## 2015-03-09 MED ORDER — LACTULOSE 10 GM/15ML PO SOLN
20.0000 g | Freq: Two times a day (BID) | ORAL | Status: DC
  Administered 2015-03-09 – 2015-03-11 (×4): 20 g
  Filled 2015-03-09 (×5): qty 30

## 2015-03-09 MED ORDER — FREE WATER
200.0000 mL | Freq: Three times a day (TID) | Status: DC
  Administered 2015-03-09 – 2015-03-14 (×16): 200 mL

## 2015-03-09 MED ORDER — ALBUTEROL SULFATE (2.5 MG/3ML) 0.083% IN NEBU
2.5000 mg | INHALATION_SOLUTION | Freq: Four times a day (QID) | RESPIRATORY_TRACT | Status: DC
  Administered 2015-03-09 – 2015-03-13 (×16): 2.5 mg via RESPIRATORY_TRACT
  Filled 2015-03-09 (×16): qty 3

## 2015-03-09 MED ORDER — LORAZEPAM 2 MG/ML IJ SOLN: 2.0000 mg | INTRAMUSCULAR | Status: DC | PRN

## 2015-03-09 MED ORDER — VITAL AF 1.2 CAL PO LIQD
1000.0000 mL | ORAL | Status: DC
  Administered 2015-03-09: 1000 mL

## 2015-03-09 MED ORDER — ACETYLCYSTEINE 20 % IN SOLN
2.0000 mL | RESPIRATORY_TRACT | Status: DC
  Filled 2015-03-09 (×11): qty 4

## 2015-03-09 MED ORDER — BACLOFEN 10 MG PO TABS
10.0000 mg | ORAL_TABLET | Freq: Two times a day (BID) | ORAL | Status: DC
  Administered 2015-03-09 – 2015-03-18 (×18): 10 mg
  Filled 2015-03-09 (×20): qty 1

## 2015-03-09 MED ORDER — ACETYLCYSTEINE 10 % IN SOLN
4.0000 mL | RESPIRATORY_TRACT | Status: DC
  Filled 2015-03-09 (×4): qty 4

## 2015-03-09 MED ORDER — DIPHENHYDRAMINE HCL 50 MG/ML IJ SOLN
25.0000 mg | Freq: Once | INTRAMUSCULAR | Status: AC
  Administered 2015-03-09: 25 mg via INTRAVENOUS
  Filled 2015-03-09: qty 1

## 2015-03-09 MED ORDER — SODIUM CHLORIDE 0.9 % IV SOLN
1.0000 g | Freq: Three times a day (TID) | INTRAVENOUS | Status: DC
  Administered 2015-03-09: 1 g via INTRAVENOUS
  Filled 2015-03-09 (×4): qty 1

## 2015-03-09 NOTE — Progress Notes (Addendum)
eLink Physician-Brief Progress Note Patient Name: Roger Tate DOB: 03/16/1991 MRN: [id number]   Date of Service  03/09/2015  HPI/Events of Note  Refractory hypoxemia, increased FiO2 and PEEP requirements--CXR > R lung opacification  eICU Interventions  Started nebulized mucomyst, discussed with on-call physician for possible bronch.      Intervention Category Major Interventions: Respiratory failure - evaluation and management  Shane Crutch 03/09/2015, 8:18 PM

## 2015-03-09 NOTE — Progress Notes (Signed)
ANTIBIOTIC CONSULT NOTE - INITIAL  Pharmacy Consult for Meropenem Dosing Indication: bacteremia  Allergies  Allergen Reactions  . Sulfa Antibiotics Anaphylaxis  . Zosyn [Piperacillin-Tazobactam In Dex] Anaphylaxis  . Ciprofloxacin Other (See Comments)    HYPOTENSION  . Lorazepam Other (See Comments)    Per pt's father. Reaction unknown. Diazepam and clonazepam OK  . Vancomycin   . Penicillins Rash    Patient Measurements: Height: 5\' 5"  (165.1 cm) Weight: 161 lb (73.029 kg) IBW/kg (Calculated) : 61.5  Vital Signs: Temp: 99.5 F (37.5 C) (08/30 0700) BP: 101/68 mmHg (08/30 0700) Pulse Rate: 115 (08/30 0700) Intake/Output from previous day:   Intake/Output from this shift:    Labs:  Recent Labs  03/07/15 1315 03/08/15 0443 03/09/15 0434  WBC 12.2* 10.9*  --   HGB 12.8* 13.0  --   PLT 203 220  --   CREATININE 0.45* 0.39* 0.49*   Estimated Creatinine Clearance: 126 mL/min (by C-G formula based on Cr of 0.49). No results for input(s): VANCOTROUGH, VANCOPEAK, VANCORANDOM, GENTTROUGH, GENTPEAK, GENTRANDOM, TOBRATROUGH, TOBRAPEAK, TOBRARND, AMIKACINPEAK, AMIKACINTROU, AMIKACIN in the last 72 hours.   Microbiology: Recent Results (from the past 720 hour(s))  Blood culture (routine x 2)     Status: None (Preliminary result)   Collection Time: 03/06/15 10:39 AM  Result Value Ref Range Status   Specimen Description BLOOD RIGHT HAND  Final   Special Requests BOTTLES DRAWN AEROBIC AND ANAEROBIC  1CC  Final   Culture NO GROWTH 3 DAYS  Final   Report Status PENDING  Incomplete  Blood culture (routine x 2)     Status: None   Collection Time: 03/06/15 11:47 AM  Result Value Ref Range Status   Specimen Description BLOOD RIGHT HAND  Final   Special Requests BOTTLES DRAWN AEROBIC AND ANAEROBIC 1CC  Final   Culture  Setup Time   Final    GRAM POSITIVE COCCI ANAEROBIC BOTTLE ONLY CRITICAL RESULT CALLED TO, READ BACK BY AND VERIFIED WITH: JOANNA LINDSAY AT 4098 03/08/15 DV GRAM  NEGATIVE RODS AEROBIC BOTTLE ONLY CRITICAL RESULT CALLED TO, READ BACK BY AND VERIFIED WITH: RACHEL HAYDEN AT 0536 ON 03/07/15 RWW    Culture   Final    ACINETOBACTER BAUMANNII AEROBIC BOTTLE ONLY COAGULASE NEGATIVE STAPHYLOCOCCUS ANAEROBIC BOTTLE ONLY COAGULASE NEGATIVE STAPHYLOCOCCUS CONSISTENT WITH CONTAMINTION    Report Status 03/09/2015 FINAL  Final   Organism ID, Bacteria ACINETOBACTER BAUMANNII  Final      Susceptibility   Acinetobacter baumannii - MIC*    CEFTRIAXONE Value in next row Resistant      RESISTANT>=64    CIPROFLOXACIN Value in next row Sensitive      SENSITIVE1    GENTAMICIN Value in next row Sensitive      SENSITIVE<=1    IMIPENEM Value in next row Sensitive      SENSITIVE<=0.25    PIP/TAZO Value in next row Resistant      RESISTANT>=128    CEFTAZIDIME Value in next row Intermediate      INTERMEDIATE16    * ACINETOBACTER BAUMANNII  Blood culture (routine x 2)     Status: None (Preliminary result)   Collection Time: 03/07/15  3:00 PM  Result Value Ref Range Status   Specimen Description BLOOD LEFT ASSIST CONTROL  Final   Special Requests   Final    BOTTLES DRAWN AEROBIC AND ANAEROBIC  AER 5CC ANA 2CC   Culture NO GROWTH 2 DAYS  Final   Report Status PENDING  Incomplete  Blood culture (  routine x 2)     Status: None (Preliminary result)   Collection Time: 03/07/15  4:42 PM  Result Value Ref Range Status   Specimen Description BLOOD LEFT ARM  Final   Special Requests BOTTLES DRAWN AEROBIC AND ANAEROBIC  4CC  Final   Culture NO GROWTH 2 DAYS  Final   Report Status PENDING  Incomplete  Urine culture     Status: None   Collection Time: 03/07/15  6:13 PM  Result Value Ref Range Status   Specimen Description URINE, CLEAN CATCH  Final   Special Requests NONE  Final   Culture NO GROWTH  Final   Report Status 03/08/2015 FINAL  Final  MRSA PCR Screening     Status: None   Collection Time: 03/07/15  7:43 PM  Result Value Ref Range Status   MRSA by PCR  NEGATIVE NEGATIVE Final    Comment:        The GeneXpert MRSA Assay (FDA approved for NASAL specimens only), is one component of a comprehensive MRSA colonization surveillance program. It is not intended to diagnose MRSA infection nor to guide or monitor treatment for MRSA infections.   Culture, bal-quantitative     Status: None (Preliminary result)   Collection Time: 03/08/15  2:50 PM  Result Value Ref Range Status   Specimen Description BRONCHIAL ALVEOLAR LAVAGE  Final   Special Requests NONE  Final   Gram Stain   Final    GOOD SPECIMEN - 80-90% WBCS MODERATE WBC SEEN RARE GRAM POSITIVE COCCI IN PAIRS RARE GRAM POSITIVE RODS    Culture NO GROWTH < 12 HOURS  Final   Report Status PENDING  Incomplete    Medical History: Past Medical History  Diagnosis Date  . Brain injury 2002  . Diabetes mellitus without complication   . Seizures   . Asthma     Medications:  Scheduled:  . albuterol  2.5 mg Inhalation Q6H  . antiseptic oral rinse  7 mL Mouth Rinse QID  . baclofen  10 mg Per Tube BID  . budesonide  0.5 mg Inhalation BID  . calcium carbonate  1 tablet Oral Daily  . chlorhexidine gluconate  15 mL Mouth Rinse BID  . clotrimazole   Topical BID  . enoxaparin (LOVENOX) injection  40 mg Subcutaneous Q24H  . free water  200 mL Per Tube 3 times per day  . lactulose  20 g Per Tube BID  . levETIRAcetam  2,250 mg Per Tube BID  . loratadine  10 mg Oral Daily  . meropenem (MERREM) IV  1 g Intravenous 3 times per day  . multivitamin with minerals   Oral Daily  . nystatin cream  1 application Topical TID  . ranitidine  150 mg Per Tube BID  . Valproic Acid  500 mg Per Tube Daily  . Valproic Acid  500 mg Per Tube Daily  . Valproic Acid  500-750 mg Oral See admin instructions  . Valproic Acid  750 mg Per Tube Daily  . vitamin C  250 mg Oral Daily   Infusions:  . feeding supplement (VITAL AF 1.2 CAL)     Assessment: Pharmacy consulted to dose meropenem for 24 yo male with  complex history being treated for acinetobacter bacteremia. Patient previously treated with cefuroxime and azithromycin.   Plan:  Will initiate patient on meropenem 1g IV Q8hr.   Pharmacy will continue to monitor and adjust per consult.   Simpson,Michael L 03/09/2015,4:28 PM

## 2015-03-09 NOTE — Progress Notes (Signed)
Pt had brief tonic clonic seizure, lasting about one minute. Was postictal for approx 2 min and is now back to baseline. No changes in vital signs or respiratory status. Dr. Clint Guy notified, have order for ativan if needed.

## 2015-03-09 NOTE — Consult Note (Signed)
I was Called to Evaluate patient by [facility], patient family at bedside, patient with acute worsening hypoxia with RT sided opacification on CXR. After discussion with family and obtaining permission from father and mother, I performed aggressive suctioning with saline bullets after placing patient in left lateral decubitus position. Extensive amounts of purulent secretions extracted with 14FR suction catheter with intermittant bagging with ambu bag. Father and Mother at bedside while aggressive suctioning performed  Father and mother also very concerned about facial rash and blotching, I have stopped abx and will give one time dose benadryl IV 25 mg. ABX regimen to be discussed with Dr. Sampson Goon  Follow up CXR pending.    Lucie Leather, M.D.  [facility]  Medical Director [facility] Medical Director [facility] Cardio-Pulmonary Department

## 2015-03-09 NOTE — Progress Notes (Signed)
Nutrition Follow-up    INTERVENTION:  EN: Dr Sung Amabile wanting to start enteral nutrition secondary to PEG in place and pt currently on vent. Recommend vital high protein at goal rate of 25ml/hr. Will provide 1872 kcals, 136 gm of protein and free water. Recommend free water flush of q 8 hr  NUTRITION DIAGNOSIS:   Increased nutrient needs related to acute illness as evidenced by estimated needs.    GOAL:   Patient will meet greater than or equal to 90% of their needs    MONITOR:    (Energy intake, Digestive system, Electrolyte and renal profile)  REASON FOR ASSESSMENT:   Other (Comment) (trach, PEG)    ASSESSMENT:      Pt s/p bronch yesterday on vent at this time   Current Nutrition: NPO   Gastrointestinal Profile: Last BM: PTA   Medications: reviewed  Electrolyte/Renal Profile and Glucose Profile:   Recent Labs Lab 03/07/15 1315 03/08/15 0443 03/09/15 0434  NA 131* 136 137  K 4.3 4.0 4.0  CL 100* 104 104  CO2 BUN <5* 5* 7  CREATININE 0.45* 0.39* 0.49*  CALCIUM 9.1 8.8* 9.2  GLUCOSE 128* 111* 159*    Nutrition-Focused Physical Exam Findings:  Unable to complete Nutrition-Focused physical exam at this time.     Weight Trend since Admission: Filed Weights   03/07/15 1317  Weight: 161 lb (73.029 kg)      Diet Order:  Diet regular Room service appropriate?: Yes; Fluid consistency:: Thin  Skin:   reviewed   Height:   Ht Readings from Last 1 Encounters:  03/07/15  (1.651 m)    Weight:   Wt Readings from Last 1 Encounters:  03/07/15 161 lb (73.029 kg)     BMI:  Body mass index is 26.79 kg/(m^2).  Estimated Nutritional Needs:   Kcal:  TEE 1931 kcals/d (Ve 9.4, Tmax 37.5)   Protein:  (1.2-2.0 g/kg) 88-146 g/d  Fluid:  (30-73ml/kg) 1610-9604 ml/d  EDUCATION NEEDS:   No education needs identified at this time  HIGH Care Level  Allanna Bresee B. Freida Busman, RD, LDN [phone] (pager)

## 2015-03-09 NOTE — Progress Notes (Signed)
[facility] - [place] at [facility]   PATIENT NAME: Roger Tate    MR#:  [id number]  DATE OF BIRTH:  [birth year]  SUBJECTIVE:  CHIEF COMPLAINT:   Chief Complaint  Patient presents with  . Blood Infection   - Patient is s/p TBI, bed bound at baseline, parents at bedside. - Blood cultures from 03/06/2015 growing Acinetobacter sensitive to carbapenems. - Had an episode of tonic-clonic seizure early this morning improved with Ativan. Remains hypoxic, status post trach requiring vent support, FiO2 of 85% this morning. -Chest x-ray with improvement of left lung collapse.  REVIEW OF SYSTEMS:  Review of Systems  Unable to perform ROS: critical illness    DRUG ALLERGIES:   Allergies  Allergen Reactions  . Sulfa Antibiotics Anaphylaxis  . Zosyn [Piperacillin-Tazobactam In Dex] Anaphylaxis  . Ciprofloxacin Other (See Comments)    HYPOTENSION  . Vancomycin   . Penicillins Rash    VITALS:  Blood pressure 101/68, pulse 115, temperature 99.5 F (37.5 C), temperature source Axillary, resp. rate 22, height 5\' 5"  (1.651 m), weight 73.029 kg (161 lb), SpO2 95 %.  PHYSICAL EXAMINATION:  Physical Exam  GENERAL:  24 y.o.-year-old patient lying in the bed with no acute distress. Sleeping, family requested not to wake him up. EYES: Pupils equal, round, reactive to light and accommodation. No scleral icterus.  HEENT: Head atraumatic, normocephalic. Oropharynx and nasopharynx clear.  NECK:  Supple, no jugular venous distention. No thyroid enlargement, no tenderness.  Trach in place. LUNGS: Coarse breath sounds bilaterally with rhonchi, much improved today. no wheezing, rales, or crepitation. No use of accessory muscles of respiration.  CARDIOVASCULAR: S1, S2 normal. No murmurs, rubs, or gallops.  ABDOMEN: Soft, nontender, nondistended. Bowel sounds present. No organomegaly or mass.  EXTREMITIES: 2+ pedal edema, No cyanosis, or clubbing. Atrophic  extremities NEUROLOGIC: Eye contact. Nodding head sometimes. Not following commands. No facial droop, alert and interactive at baseline per family. Quadriplegic and bed bound. PSYCHIATRIC: Patient is alert.  SKIN: No obvious rash, lesion, or ulcer.    LABORATORY PANEL:   CBC  Recent Labs Lab 03/08/15 0443  WBC 10.9*  HGB 13.0  HCT 37.3*  PLT 220   ------------------------------------------------------------------------------------------------------------------  Chemistries   Recent Labs Lab 03/09/15 0434  NA 137  K 4.0  CL 104  CO2 25  GLUCOSE 159*  BUN 7  CREATININE 0.49*  CALCIUM 9.2   ------------------------------------------------------------------------------------------------------------------  Cardiac Enzymes No results for input(s): TROPONINI in the last 168 hours. ------------------------------------------------------------------------------------------------------------------  RADIOLOGY:  Dg Chest 1 View  03/09/2015   CLINICAL DATA:  Ventilation pneumonitis.  EXAM: CHEST  1 VIEW  COMPARISON:  03/06/2015  FINDINGS: Tracheostomy tube unchanged and in adequate position. Lungs are hypoinflated as there is opacification over the medial right base which may be due to atelectasis/ effusion, although cannot exclude infection. Resolved left base opacification. Cardiomediastinal silhouette and remainder of the exam is unchanged.  IMPRESSION: Opacification over the medial right base which may be due to atelectasis/effusion, although cannot exclude infection.  Tracheostomy tube unchanged.   Electronically Signed   By: Elberta Fortis M.D.   On: 03/09/2015 10:35    EKG:   Orders placed or performed during the hospital encounter of 01/14/15  . EKG 12-Lead  . EKG 12-Lead  . 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  1. Sepsis- - positive blood cultures- Acinetobacter- from 03/06/15 - repeat blood cultures from 03/07/15 so far are negative - bronch and lavage and cultures pending - ID consult appreciated - changed ABX to meropenem based on sensitivities - Might need PICC and outpatient 2 weeks ABX - UA with no infection  2. Acute hypoxic respiratory failure--secondary to pneumonia, left sided- ? Collapse lung on admission - s/p chronic trach, now on 85% fio2 - appreciate pulmonary consult - s/p bronch yesterday and lavage done, CXR with improvement, but still requiring high o2. - cont vent support. - Re-evaluate again tomorrow , if needed- repeat bronch. CXR in AM  3. TBI and h/o seizure disorder- on keppra and depakote Also on baclofen for muscle spasms.  4. DM-feeds by mouth normally. Cont home dose of as needed novolog  5. Anxiety-cont home meds  6. DVT prophylaxis- lovenox   All the records are reviewed and case discussed with Care Management/Social Workerr. Management plans discussed with the patient, family and they are in agreement.  CODE STATUS: FULL CODE  TOTAL CRITICAL CARE TIME SPENT IN TAKING CARE OF THIS PATIENT: 40 minutes.   POSSIBLE D/C IN 1-2 DAYS, DEPENDING ON CLINICAL CONDITION.  Franziska Podgurski M.D on 03/09/2015 at 1:07 PM  Between 7am to 6pm - Pager - [phone]  After 6pm go to [email or web address] - password [email or web address] [facility]  [place] [facility]  Office  [phone]  CC: Primary care physician; Dorothey Baseman, MD

## 2015-03-09 NOTE — Progress Notes (Signed)
Called to patients bedside before rounds for low SPO2. Patients SPO2 went into the low 70's. Placed patient from 90%Fio2 back to 100%Fio2. Suctioned patient, bilateral breath sounds heard. After SPO2 returned within parameters treatments were started.

## 2015-03-09 NOTE — Progress Notes (Signed)
RASS 0 No overt distress Remains vent dependent  Filed Vitals:   03/09/15 0353 03/09/15 0400 03/09/15 0513 03/09/15 0700  BP:  105/33  101/68  Pulse: 114 112  115  Temp:  99.1 F (37.3 C)  99.5 F (37.5 C)  TempSrc:      Resp: Height:      Weight:      SpO2: 98% 99% 95% 95%   NAD HEENT WNL Trach site clean Diffuse rhonchi, moderate mucopurulent secretions Tachy, reg, no M Abd soft, G-tube site clean Contractures, no edema  BMP Latest Ref Rng 03/09/2015 03/08/2015 03/07/2015  Glucose 65 - 99 mg/dL 409(W) 119(J) 478(G)  BUN 6 - 20 mg/dL 7 5(L) <9(F)  Creatinine 0.61 - 1.24 mg/dL 6.21(H) 0.86(V) 7.84(O)  Sodium 135 - 145 mmol/L 137 136 131(L)  Potassium 3.5 - 5.1 mmol/L 4.0 4.0 4.3  Chloride 101 - 111 mmol/L 104 104 100(L)  CO2 22 - 32 mmol/L Calcium 8.9 - 10.3 mg/dL 9.2 9.6(E) 9.1    CBC Latest Ref Rng 03/08/2015 03/07/2015 03/06/2015  WBC 3.8 - 10.6 K/uL 10.9(H) 12.2(H) 13.2(H)  Hemoglobin 13.0 - 18.0 g/dL 95.2 12.8(L) 13.2  Hematocrit 40.0 - 52.0 % 37.3(L) 36.4(L) 37.8(L)  Platelets 150 - 440 K/uL 220 203 208    CXR: FUlly re-expanded L lung. RLL atx  IMPRESSION: Remote TBI Chronic trach and nocturnal ventilation Chronic G-tube Acinetobacter PNA and bacteremia Severe, recurrent mucus plugging VDRF reactive sinus tachycardia  PLAN/REC: Cont full vent support - settings reviewed and/or adjusted Cont vent bundle Daily SBT if/when meets criteria Cont chest percussion Cont nebulized steroids and albuterol Frequent trach suctioning Monitor BMET intermittently Monitor I/Os Correct electrolytes as indicated Begin TFs Abx discussed with ID Sampson Goon)  Cont meropenem  Will need PICC for IV abx after DC home - requested Step mother and father both updated independently  CCM time: 22 The above time includes time spent in consultation with patient and/or family members and reviewing care plan on multidisciplinary rounds   Billy Fischer,  MD PCCM service Mobile [phone] Pager [phone]

## 2015-03-09 NOTE — Progress Notes (Addendum)
eLink Physician-Brief Progress Note Patient Name: Roger Tate DOB: 08/02/1990 MRN: [id number]   Date of Service  03/09/2015  HPI/Events of Note  desat after bathing  eICU Interventions  Had RT perform recruitment maneuver, then increased PEEP to 12 from 8. Will check ABG.      Intervention Category Major Interventions: Respiratory failure - evaluation and management  Shane Crutch 03/09/2015, 6:39 PM

## 2015-03-10 ENCOUNTER — Inpatient Hospital Stay: Payer: Medicaid Other

## 2015-03-10 DIAGNOSIS — J9601 Acute respiratory failure with hypoxia: Secondary | ICD-10-CM

## 2015-03-10 LAB — GLUCOSE, CAPILLARY
GLUCOSE-CAPILLARY: 147 mg/dL — AB (ref 65–99)
GLUCOSE-CAPILLARY: 173 mg/dL — AB (ref 65–99)
Glucose-Capillary: 204 mg/dL — ABNORMAL HIGH (ref 65–99)
Glucose-Capillary: 162 mg/dL — ABNORMAL HIGH (ref 65–99)

## 2015-03-10 LAB — CBC
HEMATOCRIT: 32.7 % — AB (ref 40.0–52.0)
HEMOGLOBIN: 11.4 g/dL — AB (ref 13.0–18.0)
MCH: 32.2 pg (ref 26.0–34.0)
MCHC: 34.8 g/dL (ref 32.0–36.0)
MCV: 92.6 fL (ref 80.0–100.0)
Platelets: 222 10*3/uL (ref 150–440)
RBC: 3.53 MIL/uL — AB (ref 4.40–5.90)
RDW: 13.7 % (ref 11.5–14.5)
WBC: 18.6 10*3/uL — AB (ref 3.8–10.6)

## 2015-03-10 LAB — BASIC METABOLIC PANEL
ANION GAP: 7 (ref 5–15)
BUN: 11 mg/dL (ref 6–20)
CO2: 26 mmol/L (ref 22–32)
Calcium: 8.9 mg/dL (ref 8.9–10.3)
Chloride: 104 mmol/L (ref 101–111)
Creatinine, Ser: 0.54 mg/dL — ABNORMAL LOW (ref 0.61–1.24)
Glucose, Bld: 219 mg/dL — ABNORMAL HIGH (ref 65–99)
POTASSIUM: 3.9 mmol/L (ref 3.5–5.1)
SODIUM: 137 mmol/L (ref 135–145)

## 2015-03-10 MED ORDER — CIPROFLOXACIN IN D5W 400 MG/200ML IV SOLN
400.0000 mg | Freq: Two times a day (BID) | INTRAVENOUS | Status: DC
  Administered 2015-03-10 – 2015-03-11 (×2): 400 mg via INTRAVENOUS
  Filled 2015-03-10 (×5): qty 200

## 2015-03-10 MED ORDER — LEVETIRACETAM 100 MG/ML PO SOLN
1500.0000 mg | Freq: Two times a day (BID) | ORAL | Status: DC
  Administered 2015-03-10 – 2015-03-18 (×16): 1500 mg
  Filled 2015-03-10 (×23): qty 15

## 2015-03-10 MED ORDER — VITAL AF 1.2 CAL PO LIQD
1000.0000 mL | ORAL | Status: DC
  Administered 2015-03-10 – 2015-03-14 (×7): 1000 mL

## 2015-03-10 MED ORDER — VALPROIC ACID 250 MG/5ML PO SYRP
500.0000 mg | ORAL_SOLUTION | Freq: Three times a day (TID) | ORAL | Status: DC
  Administered 2015-03-10 – 2015-03-18 (×24): 500 mg
  Filled 2015-03-10 (×28): qty 10

## 2015-03-10 MED ORDER — DIPHENHYDRAMINE HCL 50 MG/ML IJ SOLN
25.0000 mg | Freq: Two times a day (BID) | INTRAMUSCULAR | Status: DC
  Administered 2015-03-10 – 2015-03-16 (×15): 25 mg via INTRAVENOUS
  Filled 2015-03-10 (×15): qty 1

## 2015-03-10 NOTE — Progress Notes (Signed)
Speech Therapy Note: reviewed chart notes; consulted NSG re: pt's status. Pt had to be placed on full vent support last evening post MD assessment d/t declined respiratory status. Pt is currently on the vent and is NPO. Rec. F/u oral care protocol. ST will f/u as indicated per MD.

## 2015-03-10 NOTE — Progress Notes (Addendum)
RASS 0 No overt distress Remains vent dependent  Filed Vitals:   03/10/15 1700 03/10/15 1800 03/10/15 1900 03/10/15 1940  BP:  96/76 109/89   Pulse: 124 119 124   Temp: 98.6 F (37 C)     TempSrc: Axillary     Resp: Height:      Weight:      SpO2: 94% 94% 92% 100%   NAD HEENT WNL Trach site clean Diffuse rhonchi, moderate mucopurulent secretions Tachy, reg, no M Abd soft, G-tube site clean Contractures, no edema  BMP Latest Ref Rng 03/10/2015 03/09/2015 03/08/2015  Glucose 65 - 99 mg/dL 829(F) 621(H) 086(V)  BUN 6 - 20 mg/dL 11 7 5(L)  Creatinine 7.84 - 1.24 mg/dL 6.96(E) 9.52(W) 4.13(K)  Sodium 135 - 145 mmol/L 137 137 136  Potassium 3.5 - 5.1 mmol/L 3.9 4.0 4.0  Chloride 101 - 111 mmol/L 104 104 104  CO2 22 - 32 mmol/L Calcium 8.9 - 10.3 mg/dL 8.9 9.2 4.4(W)    CBC Latest Ref Rng 03/10/2015 03/08/2015 03/07/2015  WBC 3.8 - 10.6 K/uL 18.6(H) 10.9(H) 12.2(H)  Hemoglobin 13.0 - 18.0 g/dL 11.4(L) 13.0 12.8(L)  Hematocrit 40.0 - 52.0 % 32.7(L) 37.3(L) 36.4(L)  Platelets 150 - 440 K/uL 222 220 203    CXR: Partial collapse of R lung  IMPRESSION: Remote TBI Chronic trach and nocturnal ventilation Chronic G-tube Acinetobacter PNA and bacteremia Severe, recurrent mucus plugging VDRF reactive sinus tachycardia  PLAN/REC: Cont full vent support - settings reviewed and/or adjusted Cont vent bundle Daily SBT if/when meets criteria Cont chest percussion Cont nebulized steroids and albuterol Frequent trach suctioning Monitor BMET intermittently Monitor I/Os Correct electrolytes as indicated cont TFs Abx discussed with ID Sampson Goon)  Change to Ciprofloxacin on concern of rash due to meropenem  Father updated at bedside  CCM time: 30 The above time includes time spent in consultation with patient and/or family members and reviewing care plan on multidisciplinary rounds   Billy Fischer, MD PCCM service Mobile [phone] Pager [phone]

## 2015-03-10 NOTE — Progress Notes (Signed)
[facility] -  at [facility]   PATIENT NAME: Roger Tate    MR#:  [id number]  DATE OF BIRTH:  October 29, 1990  SUBJECTIVE:  CHIEF COMPLAINT:   Chief Complaint  Patient presents with  . Blood Infection   - Patient is s/p TBI, bed bound at baseline, parents at bedside. - Blood cultures from 03/06/2015 growing Acinetobacter . - Patient had bronch on 03/08/15 and last night bronch to lavage done as he became hypoxic, now on 85% fio2 - NPO by mouth and on Tube feeds - developed red blotches on skin- meropenem discontinued for the same  REVIEW OF SYSTEMS:  Review of Systems  Unable to perform ROS: critical illness    DRUG ALLERGIES:   Allergies  Allergen Reactions  . Sulfa Antibiotics Anaphylaxis  . Zosyn [Piperacillin-Tazobactam In Dex] Anaphylaxis  . Ciprofloxacin Other (See Comments)    HYPOTENSION  . Lorazepam Other (See Comments)    Per pt's father. Reaction unknown. Diazepam and clonazepam OK  . Vancomycin   . Penicillins Rash    VITALS:  Blood pressure 110/63, pulse 134, temperature 98.8 F (37.1 C), temperature source Axillary, resp. rate 23, height 5\' 5"  (1.651 m), weight 73.029 kg (161 lb), SpO2 95 %.  PHYSICAL EXAMINATION:  Physical Exam  GENERAL:  24 y.o.-year-old patient lying in the bed, bedbound, contracted, with no acute distress. Gets anxious when trying to examine EYES: Pupils equal, round, reactive to light and accommodation. No scleral icterus.  HEENT: Head atraumatic, normocephalic. Oropharynx and nasopharynx clear.  NECK:  Supple, no jugular venous distention. No thyroid enlargement, no tenderness.  Trach in place. LUNGS: Coarse breath sounds bilaterally with rhonchi. no wheezing, rales, or crepitation. No use of accessory muscles of respiration.  CARDIOVASCULAR: S1, S2 normal. No murmurs, rubs, or gallops.  ABDOMEN: Soft, nontender, nondistended. Bowel sounds present. No organomegaly or mass. PEG tube in  place EXTREMITIES: 2+ pedal edema, No cyanosis, or clubbing. Atrophic extremities NEUROLOGIC: Eye contact. Nodding head sometimes. Not following commands. No facial droop, alert and interactive at baseline per family. Quadriplegic and bed bound. PSYCHIATRIC: Patient is alert.  SKIN: No obvious rash, lesion, or ulcer. Right dorsum hand and forearm skin red blotch   LABORATORY PANEL:   CBC  Recent Labs Lab 03/10/15 0537  WBC 18.6*  HGB 11.4*  HCT 32.7*  PLT 222   ------------------------------------------------------------------------------------------------------------------  Chemistries   Recent Labs Lab 03/10/15 0537  NA 137  K 3.9  CL 104  CO2 26  GLUCOSE 219*  BUN 11  CREATININE 0.54*  CALCIUM 8.9   ------------------------------------------------------------------------------------------------------------------  Cardiac Enzymes No results for input(s): TROPONINI in the last 168 hours. ------------------------------------------------------------------------------------------------------------------  RADIOLOGY:  Dg Chest 1 View  03/09/2015   CLINICAL DATA:  Central line placement  EXAM: CHEST  1 VIEW  COMPARISON:  03/09/2015  FINDINGS: The patient is rotated to the right. Right-sided PICC line tip terminates over the upper SVC. Tracheostomy tube is appropriately positioned. Heart size is normal. The left lung is grossly clear. Presumed ventriculoperitoneal shunt catheter tubing partly visualized over the right hemi thorax, contiguous in its visualized aspects. There is new total opacification of the right hemi thorax.  IMPRESSION: New total opacification of the right hemi thorax highly suspicious for lung collapse which could indicate aspiration or airway occlusion at the level of the right mainstem bronchus. Pleural effusion, less likely hemothorax in the setting of recent line placement, could also appear similar.  These results will be called to the ordering clinician  or representative by the Radiologist Assistant, and communication documented in the PACS or zVision Dashboard.   Electronically Signed   By: Christiana Pellant M.D.   On: 03/09/2015 19:53   Dg Chest 1 View  03/09/2015   CLINICAL DATA:  Ventilation pneumonitis.  EXAM: CHEST  1 VIEW  COMPARISON:  03/06/2015  FINDINGS: Tracheostomy tube unchanged and in adequate position. Lungs are hypoinflated as there is opacification over the medial right base which may be due to atelectasis/ effusion, although cannot exclude infection. Resolved left base opacification. Cardiomediastinal silhouette and remainder of the exam is unchanged.  IMPRESSION: Opacification over the medial right base which may be due to atelectasis/effusion, although cannot exclude infection.  Tracheostomy tube unchanged.   Electronically Signed   By: Elberta Fortis M.D.   On: 03/09/2015 10:35   Dg Chest Port 1 View  03/10/2015   CLINICAL DATA:  Respiratory failure, history diabetes mellitus  EXAM: PORTABLE CHEST - 1 VIEW  COMPARISON:  Portable exam 0605 hours compared 03/09/2015  FINDINGS: RIGHT arm PICC line tip projects over SVC.  Shunt tubing traverses RIGHT chest.  Tracheostomy tube projects over tracheal air column with tip 3.8 cm above carina.  Normal heart size and mediastinal contours for technique.  Persistent opacity in RIGHT hemi thorax, likely a combination of pleural effusion and basilar atelectasis versus consolidation, little changed when accounting for differences in technique.  LEFT lung clear.  Thoracolumbar scoliosis and osseous demineralization again seen.  IMPRESSION: Persistent RIGHT pleural effusion with atelectasis versus consolidation in RIGHT lung.   Electronically Signed   By: Ulyses Southward M.D.   On: 03/10/2015 07:10   Dg Chest Port 1 View  03/09/2015   CLINICAL DATA:  Respiratory failure  EXAM: PORTABLE CHEST - 1 VIEW  COMPARISON:  03/09/2015  FINDINGS: Tracheostomy appliance appears unremarkable. Right upper extremity PICC  line appears satisfactorily positioned. There is improved aeration on the right although there still is significant right hemi thorax opacity which probably represents a combination of right lung hypoventilation and right pleural effusion. Left lung is clear. No pneumothorax is evident.  IMPRESSION: Improved, with some aeration in the right lung although significant right hemithorax opacity persists.   Electronically Signed   By: Ellery Plunk M.D.   On: 03/09/2015 22:53    EKG:   Orders placed or performed during the hospital encounter of 01/14/15  . EKG 12-Lead  . EKG 12-Lead  . 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  1. Sepsis- - positive blood cultures- Acinetobacter- from 03/06/15 - repeat blood cultures from 03/07/15 so far are negative - bronch and lavage and cultures pending - ID consult appreciated - on meropenem based on sensitivities- but possible allergic reaction to that. - change ABX- to cipro (but patient allergic to it) or gentamycin - will need 2 weeks ABX - UA with no infection  2. Acute hypoxic respiratory failure--secondary to pneumonia,? Collapse lung on admission - s/p chronic trach, now on 85% fio2 - appreciate pulmonary consult - s/p bronch 03/08/15 done, CXR with improvement of left side followed by that and then last night became hypoxic, increased PEEP, Fio2. Repeat bronch again, right sided mucus plug and consolidation now - WBC elevated as well - cont vent support. - meropenem on hold-  awaiting ID input  3. TBI and h/o seizure disorder- on keppra and depakote Also on baclofen for muscle spasms.  4. DM-Cont home dose of as needed novolog On tube feeds  5. Anxiety-cont home meds  6. DVT prophylaxis-  lovenox   All the records are reviewed and case discussed with Care Management/Social Workerr. Management plans discussed with the patient, family and they are in agreement.  CODE STATUS: FULL CODE  TOTAL CRITICAL CARE TIME SPENT IN TAKING CARE OF THIS PATIENT: 35 minutes.   Enid Baas M.D on 03/10/2015 at 9:49 AM  Between 7am to 6pm - Pager - [phone]  After 6pm go to [email or web address] - password [email or web address] [facility]  [place] [facility]  Office  [phone]  CC: Primary care physician; Dorothey Baseman, MD

## 2015-03-10 NOTE — Progress Notes (Signed)
Inpatient Diabetes Program Recommendations  AACE/ADA: New Consensus Statement on Inpatient Glycemic Control (2013)  Target Ranges:  Prepandial:   less than 140 mg/dL      Peak postprandial:   less than 180 mg/dL (1-2 hours)      Critically ill patients:  140 - 180 mg/dL   Results for MUHANNAD, BIGNELL (MRN [id number]) as of 03/10/2015 11:13  Ref. Range 03/10/2015 08:12  Glucose-Capillary Latest Ref Range: 65-99 mg/dL 782 (H)   Results for JABIN, TAPP (MRN [id number]) as of 03/10/2015 11:13  Ref. Range 03/09/2015 04:34 03/10/2015 05:37  Glucose Latest Ref Range: 65-99 mg/dL 578 (H) 469 (H)    Outpatient Diabetes medications: Novolog 2 units PRN if glucose is greater than 200 mg/dl Current orders for Inpatient glycemic control: Novolog 2 units PRN  Inpatient Diabetes Program Recommendations Correction (SSI): Patient is receiving tube feedings which is contributing to hyperglycemia. Please discontinue current Novolog 2 units PRN order and use Glycemic Control order set to order Novolog 0-9 units Q4H.  Thanks, Orlando Penner, RN, MSN, CCRN, CDE Diabetes Coordinator Inpatient Diabetes Program [phone] (Team Pager from 8am to 5pm) [phone] (AP office) [phone] [facility] office) [phone] [facility] office)

## 2015-03-10 NOTE — Progress Notes (Signed)
[facility] CLINIC INFECTIOUS DISEASE PROGRESS NOTE Date of Admission:  03/07/2015     ID: Roger Tate is a 24 y.o. male with acinetobacter bacteremia  Active Problems:   Bacteremia  Subjective: He remains on vent, low grade temp.  Had hypoxia and then bronch with lavage. Developed rash so meropenem stopped and placed on cipro.  ROS  Eleven systems are reviewed and negative except per hpi  Medications:  Antibiotics Given (last 72 hours)    Date/Time Action Medication Dose Rate   03/07/15 2005 Given   azithromycin (ZITHROMAX) 250 mg in dextrose 5 % 125 mL IVPB 250 mg 125 mL/hr   03/07/15 2257 Given   cefTAZidime (FORTAZ) 2 g in dextrose 5 % 50 mL IVPB 2 g 100 mL/hr   03/08/15 0545 Given   cefTAZidime (FORTAZ) 2 g in dextrose 5 % 50 mL IVPB 2 g 100 mL/hr   03/08/15 1440 Given   cefTAZidime (FORTAZ) 2 g in dextrose 5 % 50 mL IVPB 2 g 100 mL/hr   03/08/15 1918 Given   azithromycin (ZITHROMAX) 250 mg in dextrose 5 % 125 mL IVPB 250 mg 125 mL/hr   03/08/15 2151 Given   cefTAZidime (FORTAZ) 2 g in dextrose 5 % 50 mL IVPB 2 g 100 mL/hr   03/09/15 0508 Given   cefTAZidime (FORTAZ) 2 g in dextrose 5 % 50 mL IVPB 2 g 100 mL/hr   03/09/15 1550 Given   meropenem (MERREM) 1 g in sodium chloride 0.9 % 100 mL IVPB 1 g 200 mL/hr     . albuterol  2.5 mg Inhalation Q6H  . antiseptic oral rinse  7 mL Mouth Rinse QID  . baclofen  10 mg Per Tube BID  . budesonide  0.5 mg Inhalation BID  . calcium carbonate  1 tablet Oral Daily  . chlorhexidine gluconate  15 mL Mouth Rinse BID  . clotrimazole   Topical BID  . enoxaparin (LOVENOX) injection  40 mg Subcutaneous Q24H  . free water  200 mL Per Tube 3 times per day  . lactulose  20 g Per Tube BID  . levETIRAcetam  2,250 mg Per Tube BID  . loratadine  10 mg Oral Daily  . multivitamin with minerals   Oral Daily  . nystatin cream  1 application Topical TID  . ranitidine  150 mg Per Tube BID  . Valproic Acid  500 mg Per Tube Daily  . Valproic Acid   500 mg Per Tube Daily  . Valproic Acid  500-750 mg Oral See admin instructions  . Valproic Acid  750 mg Per Tube Daily  . vitamin C  250 mg Oral Daily    Objective: Vital signs in last 24 hours: Temp:  [98.8 F (37.1 C)-100.3 F (37.9 C)] 99.3 F (37.4 C) (08/31 1130) Pulse Rate:  [111-148] 113 (08/31 1100) Resp:  [15-26] 21 (08/31 1100) BP: (86-121)/(51-78) 98/58 mmHg (08/31 1100) SpO2:  [83 %-100 %] 99 % (08/31 1100) FiO2 (%):  [60 %-100 %] 100 % (08/31 0740) GENERAL: 24 y.o.-year-old patient lying in the bed, bedbound, contracted, with no acute distress. Gets anxious when trying to examine EYES: Pupils equal, round, reactive to light and accommodation. No scleral icterus.  HEENT: Head atraumatic, normocephalic. Oropharynx and nasopharynx clear.  NECK: Supple, no jugular venous distention. No thyroid enlargement, no tenderness.  Trach in place. LUNGS: Coarse breath sounds bilaterally with rhonchi. no wheezing, rales, or crepitation. No use of accessory muscles of respiration.  CARDIOVASCULAR: S1, S2 normal. No  murmurs, rubs, or gallops.  ABDOMEN: Soft, nontender, nondistended. Bowel sounds present. No organomegaly or mass. PEG tube in place EXTREMITIES: 2+ pedal edema, No cyanosis, or clubbing. Atrophic extremities NEUROLOGIC: Eye contact. Nodding head sometimes. Not following commands. No facial droop, alert and interactive at baseline per family. Quadriplegic and bed bound. PSYCHIATRIC: Patient is alert.  SKIN: No obvious rash, lesion, or ulcer. Right dorsum hand and forearm skin red blotch  Lab Results  Recent Labs  03/08/15 0443 03/09/15 0434 03/10/15 0537  WBC 10.9*  --  18.6*  HGB 13.0  --  11.4*  HCT 37.3*  --  32.7*  NA 136 137 137  K 4.0 4.0 3.9  CL 104 104 104  CO2 25 25 26   BUN 5* 7 11  CREATININE 0.39* 0.49* 0.54*    Microbiology: Results for orders placed or performed during the hospital encounter of 03/07/15  Blood culture (routine x 2)      Status: None (Preliminary result)   Collection Time: 03/07/15  3:00 PM  Result Value Ref Range Status   Specimen Description BLOOD LEFT ASSIST CONTROL  Final   Special Requests   Final    BOTTLES DRAWN AEROBIC AND ANAEROBIC  AER 5CC ANA 2CC   Culture NO GROWTH 3 DAYS  Final   Report Status PENDING  Incomplete  Blood culture (routine x 2)     Status: None (Preliminary result)   Collection Time: 03/07/15  4:42 PM  Result Value Ref Range Status   Specimen Description BLOOD LEFT ARM  Final   Special Requests BOTTLES DRAWN AEROBIC AND ANAEROBIC  4CC  Final   Culture NO GROWTH 3 DAYS  Final   Report Status PENDING  Incomplete  Urine culture     Status: None   Collection Time: 03/07/15  6:13 PM  Result Value Ref Range Status   Specimen Description URINE, CLEAN CATCH  Final   Special Requests NONE  Final   Culture NO GROWTH  Final   Report Status 03/08/2015 FINAL  Final  MRSA PCR Screening     Status: None   Collection Time: 03/07/15  7:43 PM  Result Value Ref Range Status   MRSA by PCR NEGATIVE NEGATIVE Final    Comment:        The GeneXpert MRSA Assay (FDA approved for NASAL specimens only), is one component of a comprehensive MRSA colonization surveillance program. It is not intended to diagnose MRSA infection nor to guide or monitor treatment for MRSA infections.   Culture, bal-quantitative     Status: None (Preliminary result)   Collection Time: 03/08/15  2:50 PM  Result Value Ref Range Status   Specimen Description BRONCHIAL ALVEOLAR LAVAGE  Final   Special Requests NONE  Final   Gram Stain   Final    GOOD SPECIMEN - 80-90% WBCS MODERATE WBC SEEN RARE GRAM POSITIVE COCCI IN PAIRS RARE GRAM POSITIVE RODS    Culture   Final    LIGHT GROWTH GRAM NEGATIVE RODS IDENTIFICATION AND SUSCEPTIBILITIES TO FOLLOW    Report Status PENDING  Incomplete    Studies/Results: Dg Chest 1 View  03/09/2015   CLINICAL DATA:  Central line placement  EXAM: CHEST  1 VIEW  COMPARISON:   03/09/2015  FINDINGS: The patient is rotated to the right. Right-sided PICC line tip terminates over the upper SVC. Tracheostomy tube is appropriately positioned. Heart size is normal. The left lung is grossly clear. Presumed ventriculoperitoneal shunt catheter tubing partly visualized over the right hemi thorax,  contiguous in its visualized aspects. There is new total opacification of the right hemi thorax.  IMPRESSION: New total opacification of the right hemi thorax highly suspicious for lung collapse which could indicate aspiration or airway occlusion at the level of the right mainstem bronchus. Pleural effusion, less likely hemothorax in the setting of recent line placement, could also appear similar.  These results will be called to the ordering clinician or representative by the Radiologist Assistant, and communication documented in the PACS or zVision Dashboard.   Electronically Signed   By: Christiana Pellant M.D.   On: 03/09/2015 19:53   Dg Chest 1 View  03/09/2015   CLINICAL DATA:  Ventilation pneumonitis.  EXAM: CHEST  1 VIEW  COMPARISON:  03/06/2015  FINDINGS: Tracheostomy tube unchanged and in adequate position. Lungs are hypoinflated as there is opacification over the medial right base which may be due to atelectasis/ effusion, although cannot exclude infection. Resolved left base opacification. Cardiomediastinal silhouette and remainder of the exam is unchanged.  IMPRESSION: Opacification over the medial right base which may be due to atelectasis/effusion, although cannot exclude infection.  Tracheostomy tube unchanged.   Electronically Signed   By: Elberta Fortis M.D.   On: 03/09/2015 10:35   Dg Chest Port 1 View  03/10/2015   CLINICAL DATA:  Respiratory failure, history diabetes mellitus  EXAM: PORTABLE CHEST - 1 VIEW  COMPARISON:  Portable exam 0605 hours compared 03/09/2015  FINDINGS: RIGHT arm PICC line tip projects over SVC.  Shunt tubing traverses RIGHT chest.  Tracheostomy tube projects  over tracheal air column with tip 3.8 cm above carina.  Normal heart size and mediastinal contours for technique.  Persistent opacity in RIGHT hemi thorax, likely a combination of pleural effusion and basilar atelectasis versus consolidation, little changed when accounting for differences in technique.  LEFT lung clear.  Thoracolumbar scoliosis and osseous demineralization again seen.  IMPRESSION: Persistent RIGHT pleural effusion with atelectasis versus consolidation in RIGHT lung.   Electronically Signed   By: Ulyses Southward M.D.   On: 03/10/2015 07:10   Dg Chest Port 1 View  03/09/2015   CLINICAL DATA:  Respiratory failure  EXAM: PORTABLE CHEST - 1 VIEW  COMPARISON:  03/09/2015  FINDINGS: Tracheostomy appliance appears unremarkable. Right upper extremity PICC line appears satisfactorily positioned. There is improved aeration on the right although there still is significant right hemi thorax opacity which probably represents a combination of right lung hypoventilation and right pleural effusion. Left lung is clear. No pneumothorax is evident.  IMPRESSION: Improved, with some aeration in the right lung although significant right hemithorax opacity persists.   Electronically Signed   By: Ellery Plunk M.D.   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 Was treated with ceftazidime but changed to meropenem once sensis back on acinetobacter. However he developed a mild rash so meropenem stopped and now on cipro IV.  Has had recurrent issues with mucus plugging (the R side on 8/30) and has had 2 bronchs.  Cultures from bronch showing GNR.    Recommendations Start IV ciprofloxacin - can give IV benadryl 30 min prior to dosing May need to broaden if other organisms found on BAL cultures  I have asked micro lab to check sensitivity to minocycline but  this will take several days Further recs based on culture results. Thank you very  much for the consult. Will follow with you.  FITZGERALD, DAVID   03/10/2015, 1:50 PM

## 2015-03-10 NOTE — Progress Notes (Signed)
Nutrition Follow-up       INTERVENTION:  EN: Recommend increasing vital 1.2 to goal rate of 18ml/hr at this time. Discussed with Dr. Sung Amabile and agreed for rate increase.     NUTRITION DIAGNOSIS:   Increased nutrient needs related to acute illness as evidenced by estimated needs, being addressed with tube feeding    GOAL:   Patient will meet greater than or equal to 90% of their needs    MONITOR:    (Energy intake, Digestive system, Electrolyte and renal profile)  REASON FOR ASSESSMENT:   Other (Comment) (trach, PEG)    ASSESSMENT:      Pt remains on vent.    Current Nutrition: Pt tolerating vital 1.2 at 74ml/hr   Gastrointestinal Profile:Residuals 83ml-30ml noted, no BM in 3 days, abdomen with ascites per RN, Charli Last BM: PTA   Medications: lactulose, dulcolax prn  Electrolyte/Renal Profile and Glucose Profile:   Recent Labs Lab 03/08/15 0443 03/09/15 0434 03/10/15 0537  NA 136 137 137  K 4.0 4.0 3.9  CL 104 104 104  CO2 BUN 5* 7 11  CREATININE 0.39* 0.49* 0.54*  CALCIUM 8.8* 9.2 8.9  GLUCOSE 111* 159* 219*     Weight Trend since Admission: Filed Weights   03/07/15 1317  Weight: 161 lb (73.029 kg)       Diet Order:   NPO  Skin:   reviewed   Height:   Ht Readings from Last 1 Encounters:  03/07/15  (1.651 m)    Weight:   Wt Readings from Last 1 Encounters:  03/07/15 161 lb (73.029 kg)       BMI:  Body mass index is 26.79 kg/(m^2).  Estimated Nutritional Needs:   Kcal:  TEE 1931 kcals/d (Ve 9.4, Tmax 37.5)   Protein:  (1.2-2.0 g/kg) 88-146 g/d  Fluid:  (30-6ml/kg) 1610-9604 ml/d  EDUCATION NEEDS:   No education needs identified at this time  HIGH Care Level Meelah Tallo B. Freida Busman, RD, LDN [phone] (pager)

## 2015-03-10 NOTE — Progress Notes (Signed)
Discussed with patient's neurologist Dr. Malvin Johns, about adjusting his antiepileptics. -Keppra levels and Depakote levels that were ordered few weeks ago versus super high as an outpatient. -So neurologist wants to decrease patient's Keppra to 1500 mg twice a day and Depakote to 500 mg 3 times a day.

## 2015-03-10 NOTE — Care Management Note (Signed)
Case Management Note  Patient Details  Name: Dain Laseter MRN: [id number] Date of Birth: 10-22-1990  Subjective/Objective:    Briefly spoke with patients dad. Patient is followed by [facility] for 16 hour per day nursing coverage.   Patient has no additional nursing or personal care needs at home and has all needed equipment.  Following progression.              Action/Plan:   Expected Discharge Date:                  Expected Discharge Plan:  Home w Home Health Services  In-House Referral:     Discharge planning Services  CM Consult  Post Acute Care Choice:    Choice offered to:     DME Arranged:    DME Agency:     HH Arranged:    HH Agency:     Status of Service:  In process, will continue to follow  Medicare Important Message Given:    Date Medicare IM Given:    Medicare IM give by:    Date Additional Medicare IM Given:    Additional Medicare Important Message give by:     If discussed at Long Length of Stay Meetings, dates discussed:    Additional Comments:  Marily Memos, RN 03/10/2015, 1:25 PM

## 2015-03-11 ENCOUNTER — Inpatient Hospital Stay: Payer: Medicaid Other

## 2015-03-11 DIAGNOSIS — Z930 Tracheostomy status: Secondary | ICD-10-CM

## 2015-03-11 LAB — CULTURE, BAL-QUANTITATIVE W GRAM STAIN

## 2015-03-11 LAB — CBC
HCT: 30.3 % — ABNORMAL LOW (ref 40.0–52.0)
Hemoglobin: 10.3 g/dL — ABNORMAL LOW (ref 13.0–18.0)
MCH: 31.6 pg (ref 26.0–34.0)
MCHC: 33.8 g/dL (ref 32.0–36.0)
MCV: 93.5 fL (ref 80.0–100.0)
PLATELETS: 206 10*3/uL (ref 150–440)
RBC: 3.25 MIL/uL — ABNORMAL LOW (ref 4.40–5.90)
RDW: 13.7 % (ref 11.5–14.5)
WBC: 13 10*3/uL — AB (ref 3.8–10.6)

## 2015-03-11 LAB — CULTURE, BAL-QUANTITATIVE

## 2015-03-11 LAB — CULTURE, BLOOD (ROUTINE X 2): Culture: NO GROWTH

## 2015-03-11 LAB — BASIC METABOLIC PANEL
ANION GAP: 7 (ref 5–15)
BUN: 10 mg/dL (ref 6–20)
CALCIUM: 8.6 mg/dL — AB (ref 8.9–10.3)
CO2: 27 mmol/L (ref 22–32)
CREATININE: 0.41 mg/dL — AB (ref 0.61–1.24)
Chloride: 101 mmol/L (ref 101–111)
Glucose, Bld: 241 mg/dL — ABNORMAL HIGH (ref 65–99)
Potassium: 4.1 mmol/L (ref 3.5–5.1)
SODIUM: 135 mmol/L (ref 135–145)

## 2015-03-11 LAB — GLUCOSE, CAPILLARY
GLUCOSE-CAPILLARY: 224 mg/dL — AB (ref 65–99)
GLUCOSE-CAPILLARY: 278 mg/dL — AB (ref 65–99)
Glucose-Capillary: 178 mg/dL — ABNORMAL HIGH (ref 65–99)
Glucose-Capillary: 200 mg/dL — ABNORMAL HIGH (ref 65–99)
Glucose-Capillary: 261 mg/dL — ABNORMAL HIGH (ref 65–99)

## 2015-03-11 MED ORDER — CLONAZEPAM 0.5 MG PO TABS: 1.0000 mg | ORAL_TABLET | ORAL | Status: DC | PRN

## 2015-03-11 MED ORDER — LACTULOSE 10 GM/15ML PO SOLN: 20.0000 g | Freq: Two times a day (BID) | ORAL | Status: DC | PRN

## 2015-03-11 MED ORDER — INSULIN ASPART 100 UNIT/ML ~~LOC~~ SOLN
0.0000 [IU] | SUBCUTANEOUS | Status: DC
  Administered 2015-03-11 (×2): 5 [IU] via SUBCUTANEOUS
  Administered 2015-03-11: 2 [IU] via SUBCUTANEOUS
  Administered 2015-03-12: 5 [IU] via SUBCUTANEOUS
  Administered 2015-03-12: 3 [IU] via SUBCUTANEOUS
  Administered 2015-03-12: 5 [IU] via SUBCUTANEOUS
  Administered 2015-03-12 (×3): 7 [IU] via SUBCUTANEOUS
  Administered 2015-03-13: 5 [IU] via SUBCUTANEOUS
  Administered 2015-03-13: 7 [IU] via SUBCUTANEOUS
  Administered 2015-03-13: 5 [IU] via SUBCUTANEOUS
  Administered 2015-03-13: 3 [IU] via SUBCUTANEOUS
  Administered 2015-03-13: 5 [IU] via SUBCUTANEOUS
  Administered 2015-03-13: 2 [IU] via SUBCUTANEOUS
  Administered 2015-03-14 (×2): 3 [IU] via SUBCUTANEOUS
  Administered 2015-03-14: 2 [IU] via SUBCUTANEOUS
  Administered 2015-03-14 (×2): 3 [IU] via SUBCUTANEOUS
  Administered 2015-03-14: 5 [IU] via SUBCUTANEOUS
  Administered 2015-03-15: 3 [IU] via SUBCUTANEOUS
  Administered 2015-03-15 (×4): 5 [IU] via SUBCUTANEOUS
  Administered 2015-03-15: 2 [IU] via SUBCUTANEOUS
  Administered 2015-03-16: 5 [IU] via SUBCUTANEOUS
  Administered 2015-03-16: 3 [IU] via SUBCUTANEOUS
  Administered 2015-03-16 (×2): 7 [IU] via SUBCUTANEOUS
  Administered 2015-03-16 – 2015-03-17 (×4): 5 [IU] via SUBCUTANEOUS
  Administered 2015-03-17: 2 [IU] via SUBCUTANEOUS
  Administered 2015-03-17: 1 [IU] via SUBCUTANEOUS
  Administered 2015-03-17: 3 [IU] via SUBCUTANEOUS
  Administered 2015-03-18: 5 [IU] via SUBCUTANEOUS
  Administered 2015-03-18: 100 [IU] via SUBCUTANEOUS
  Administered 2015-03-18: 3 [IU] via SUBCUTANEOUS
  Administered 2015-03-18: 1 [IU] via SUBCUTANEOUS
  Filled 2015-03-11: qty 5
  Filled 2015-03-11: qty 7
  Filled 2015-03-11: qty 2
  Filled 2015-03-11 (×2): qty 5
  Filled 2015-03-11: qty 3
  Filled 2015-03-11 (×3): qty 5
  Filled 2015-03-11: qty 1
  Filled 2015-03-11: qty 3
  Filled 2015-03-11: qty 5
  Filled 2015-03-11: qty 7
  Filled 2015-03-11 (×2): qty 5
  Filled 2015-03-11 (×2): qty 3
  Filled 2015-03-11: qty 5
  Filled 2015-03-11: qty 1
  Filled 2015-03-11: qty 2
  Filled 2015-03-11: qty 3
  Filled 2015-03-11: qty 5
  Filled 2015-03-11: qty 7
  Filled 2015-03-11: qty 3
  Filled 2015-03-11: qty 1
  Filled 2015-03-11: qty 5
  Filled 2015-03-11: qty 7
  Filled 2015-03-11 (×2): qty 2
  Filled 2015-03-11: qty 5
  Filled 2015-03-11: qty 2
  Filled 2015-03-11: qty 5
  Filled 2015-03-11: qty 3
  Filled 2015-03-11: qty 7
  Filled 2015-03-11: qty 5
  Filled 2015-03-11: qty 3
  Filled 2015-03-11 (×3): qty 5
  Filled 2015-03-11: qty 9
  Filled 2015-03-11: qty 7
  Filled 2015-03-11: qty 5

## 2015-03-11 MED ORDER — CIPROFLOXACIN IN D5W 400 MG/200ML IV SOLN
400.0000 mg | Freq: Three times a day (TID) | INTRAVENOUS | Status: DC
  Administered 2015-03-11 – 2015-03-13 (×6): 400 mg via INTRAVENOUS
  Filled 2015-03-11 (×10): qty 200

## 2015-03-11 NOTE — Progress Notes (Signed)
Speech Therapy Note: pt remains on full vent support at this time w/ declined respiratory and medical status' as per chart notes. When pt improves and the option to return to an oral diet is discussed by family/MD, would strongly rec. a MBSS to objectively assess pt's swallow function d/t his high risk for aspiration. ST will f/u w/ pt's status next 1-2 days.

## 2015-03-11 NOTE — Progress Notes (Signed)
Nutrition Follow-up    INTERVENTION:  EN: Continue vital 1.2 at 15ml/hr to meet nutritional needs   NUTRITION DIAGNOSIS:   Increased nutrient needs related to acute illness as evidenced by estimated needs.    GOAL:   Patient will meet greater than or equal to 90% of their needs    MONITOR:    (Energy intake, Digestive system, Electrolyte and renal profile)  REASON FOR ASSESSMENT:   Other (Comment) (trach, PEG)    ASSESSMENT:      Pt remains on vent.     Current Nutrition:Tube feeding was off this am until 11:00 secondary to pt compliant of nausea per RN, Charli. Residuals have been , 30ml, 0 ml noted (<557ml threshold to stop feeding)   Gastrointestinal Profile: Last BM: 9/1 large bowel movement 8/31 large bowel movement noted   Medications: reviewed  Electrolyte/Renal Profile and Glucose Profile:   Recent Labs Lab 03/09/15 0434 03/10/15 0537 03/11/15 0408  NA 137 137 135  K 4.0 3.9 4.1  CL 104 104 101  CO2 BUN CREATININE 0.49* 0.54* 0.41*  CALCIUM 9.2 8.9 8.6*  GLUCOSE 159* 219* 241*     Weight Trend since Admission: Filed Weights   03/07/15 1317  Weight: 161 lb (73.029 kg)      Diet Order:   NPO  Skin:   reviewed     Height:   Ht Readings from Last 1 Encounters:  03/07/15  (1.651 m)    Weight:   Wt Readings from Last 1 Encounters:  03/07/15 161 lb (73.029 kg)     BMI:  Body mass index is 26.79 kg/(m^2).  Estimated Nutritional Needs:   Kcal:  TEE 1931 kcals/d (Ve 9.4, Tmax 37.5)   Protein:  (1.2-2.0 g/kg) 88-146 g/d  Fluid:  (30-56ml/kg) 1610-9604 ml/d  EDUCATION NEEDS:   No education needs identified at this time  HIGH Care Level  Samona Chihuahua B. Freida Busman, RD, LDN [phone] (pager)

## 2015-03-11 NOTE — Care Management Note (Signed)
Case Management Note  Patient Details  Name: Roger Tate MRN: [id number] Date of Birth: 05-20-1991  Subjective/Objective:   Blood cultures from 03/06/2015 growing Acinetobacter . Patient remains on 85% FiO2, PEEP decreased to 10. Still febrile, had a temperature of 10 71F this morning. ID consulted.    [facility] updated on clinical status               Action/Plan: Following. Will discharge home when medically stable with resumption of care from [facility]  Expected Discharge Date:                  Expected Discharge Plan:  Home w Home Health Services  In-House Referral:     Discharge planning Services  CM Consult  Post Acute Care Choice:    Choice offered to:     DME Arranged:    DME Agency:     HH Arranged:    HH Agency:     Status of Service:  In process, will continue to follow  Medicare Important Message Given:    Date Medicare IM Given:    Medicare IM give by:    Date Additional Medicare IM Given:    Additional Medicare Important Message give by:     If discussed at Long Length of Stay Meetings, dates discussed:    Additional Comments:  Marily Memos, RN 03/11/2015, 8:22 AM

## 2015-03-11 NOTE — Progress Notes (Signed)
[facility] CLINIC INFECTIOUS DISEASE PROGRESS NOTE Date of Admission:  03/07/2015     ID: Roger Tate is a 24 y.o. male with acinetobacter bacteremia  Active Problems:   Bacteremia  Subjective: He remains on vent - , requiring repeated suctioning with lots of secretions.  Seems to be tolerating ciprofloxacin.  Had a high TF residual and had some nausea when rate was increased   ROS  Eleven systems are reviewed and negative except per hpi  Medications:  Antibiotics Given (last 72 hours)    Date/Time Action Medication Dose Rate   03/08/15 1440 Given   cefTAZidime (FORTAZ) 2 g in dextrose 5 % 50 mL IVPB 2 g 100 mL/hr   03/08/15 1918 Given   azithromycin (ZITHROMAX) 250 mg in dextrose 5 % 125 mL IVPB 250 mg 125 mL/hr   03/08/15 2151 Given   cefTAZidime (FORTAZ) 2 g in dextrose 5 % 50 mL IVPB 2 g 100 mL/hr   03/09/15 0508 Given   cefTAZidime (FORTAZ) 2 g in dextrose 5 % 50 mL IVPB 2 g 100 mL/hr   03/09/15 1550 Given   meropenem (MERREM) 1 g in sodium chloride 0.9 % 100 mL IVPB 1 g 200 mL/hr   03/10/15 1819 Given   ciprofloxacin (CIPRO) IVPB 400 mg 400 mg 200 mL/hr   03/11/15 0223 Given   ciprofloxacin (CIPRO) IVPB 400 mg 400 mg 200 mL/hr     . albuterol  2.5 mg Inhalation Q6H  . antiseptic oral rinse  7 mL Mouth Rinse QID  . baclofen  10 mg Per Tube BID  . budesonide  0.5 mg Inhalation BID  . calcium carbonate  1 tablet Oral Daily  . chlorhexidine gluconate  15 mL Mouth Rinse BID  . ciprofloxacin  400 mg Intravenous Q12H  . clotrimazole   Topical BID  . diphenhydrAMINE  25 mg Intravenous Q12H  . enoxaparin (LOVENOX) injection  40 mg Subcutaneous Q24H  . free water  200 mL Per Tube 3 times per day  . insulin aspart  0-9 Units Subcutaneous 6 times per day  . lactulose  20 g Per Tube BID  . levETIRAcetam  1,500 mg Per Tube BID  . loratadine  10 mg Oral Daily  . multivitamin with minerals   Oral Daily  . nystatin cream  1 application Topical TID  . ranitidine  150 mg Per Tube  BID  . Valproic Acid  500 mg Per Tube TID  . vitamin C  250 mg Oral Daily    Objective: Vital signs in last 24 hours: Temp:  [98.6 F (37 C)-101 F (38.3 C)] 99.9 F (37.7 C) (09/01 0600) Pulse Rate:  [106-132] 119 (09/01 0600) Resp:  [17-30] 19 (09/01 0600) BP: (96-123)/(58-89) 104/63 mmHg (09/01 0600) SpO2:  [90 %-100 %] 97 % (09/01 0600) FiO2 (%):  [85 %] 85 % (09/01 0517) GENERAL: 24 y.o.-year-old patient lying in the bed, bedbound, contracted, with no acute distress. Gets anxious when trying to examine EYES: Pupils equal, round, reactive to light and accommodation. No scleral icterus.  HEENT: Head atraumatic, normocephalic. Oropharynx and nasopharynx clear.  NECK: Supple, no jugular venous distention. No thyroid enlargement, no tenderness.  Trach in place. LUNGS: Coarse breath sounds bilaterally with rhonchi. no wheezing, rales, or crepitation. No use of accessory muscles of respiration.  CARDIOVASCULAR: S1, S2 normal. No murmurs, rubs, or gallops.  PICC RUE ABDOMEN: Soft, nontender, nondistended. Bowel sounds present. No organomegaly or mass. PEG tube in place EXTREMITIES: 2+ pedal edema, No cyanosis,  or clubbing. Atrophic extremities NEUROLOGIC: Eye contact. Nodding head sometimes. Not following commands. No facial droop, alert and interactive at baseline per family. Quadriplegic and bed bound. PSYCHIATRIC: Patient is alert.  SKIN: No obvious rash, lesion, or ulcer. Right dorsum hand and forearm skin red blotch  Lab Results  Recent Labs  03/10/15 0537 03/11/15 0408  WBC 18.6* 13.0*  HGB 11.4* 10.3*  HCT 32.7* 30.3*  NA 137 135  K 3.9 4.1  CL 104 101  CO2 26 27  BUN 11 10  CREATININE 0.54* 0.41*    Microbiology: Results for orders placed or performed during the hospital encounter of 03/07/15  Blood culture (routine x 2)     Status: None (Preliminary result)   Collection Time: 03/07/15  3:00 PM  Result Value Ref Range Status   Specimen Description  BLOOD LEFT ASSIST CONTROL  Final   Special Requests   Final    BOTTLES DRAWN AEROBIC AND ANAEROBIC  AER 5CC ANA 2CC   Culture NO GROWTH 4 DAYS  Final   Report Status PENDING  Incomplete  Blood culture (routine x 2)     Status: None (Preliminary result)   Collection Time: 03/07/15  4:42 PM  Result Value Ref Range Status   Specimen Description BLOOD LEFT ARM  Final   Special Requests BOTTLES DRAWN AEROBIC AND ANAEROBIC  4CC  Final   Culture NO GROWTH 4 DAYS  Final   Report Status PENDING  Incomplete  Urine culture     Status: None   Collection Time: 03/07/15  6:13 PM  Result Value Ref Range Status   Specimen Description URINE, CLEAN CATCH  Final   Special Requests NONE  Final   Culture NO GROWTH  Final   Report Status 03/08/2015 FINAL  Final  MRSA PCR Screening     Status: None   Collection Time: 03/07/15  7:43 PM  Result Value Ref Range Status   MRSA by PCR NEGATIVE NEGATIVE Final    Comment:        The GeneXpert MRSA Assay (FDA approved for NASAL specimens only), is one component of a comprehensive MRSA colonization surveillance program. It is not intended to diagnose MRSA infection nor to guide or monitor treatment for MRSA infections.   Culture, bal-quantitative     Status: None   Collection Time: 03/08/15  2:50 PM  Result Value Ref Range Status   Specimen Description BRONCHIAL ALVEOLAR LAVAGE  Final   Special Requests NONE  Final   Gram Stain   Final    GOOD SPECIMEN - 80-90% WBCS MODERATE WBC SEEN RARE GRAM POSITIVE COCCI IN PAIRS RARE GRAM NEGATIVE RODS    Culture LIGHT GROWTH PSEUDOMONAS AERUGINOSA  Final   Report Status 03/11/2015 FINAL  Final   Organism ID, Bacteria PSEUDOMONAS AERUGINOSA  Final      Susceptibility   Pseudomonas aeruginosa - MIC*    CEFTAZIDIME Value in next row Sensitive      SENSITIVE2    CIPROFLOXACIN Value in next row Sensitive      SENSITIVE0.5    GENTAMICIN Value in next row Resistant      RESISTANT>=16    IMIPENEM Value in next  row Resistant      RESISTANT>=16    PIP/TAZO Value in next row Sensitive      SENSITIVE<=4    CEFEPIME Value in next row Sensitive      SENSITIVE8    * LIGHT GROWTH PSEUDOMONAS AERUGINOSA    Studies/Results: Dg Chest 1 View  03/09/2015   CLINICAL DATA:  Central line placement  EXAM: CHEST  1 VIEW  COMPARISON:  03/09/2015  FINDINGS: The patient is rotated to the right. Right-sided PICC line tip terminates over the upper SVC. Tracheostomy tube is appropriately positioned. Heart size is normal. The left lung is grossly clear. Presumed ventriculoperitoneal shunt catheter tubing partly visualized over the right hemi thorax, contiguous in its visualized aspects. There is new total opacification of the right hemi thorax.  IMPRESSION: New total opacification of the right hemi thorax highly suspicious for lung collapse which could indicate aspiration or airway occlusion at the level of the right mainstem bronchus. Pleural effusion, less likely hemothorax in the setting of recent line placement, could also appear similar.  These results will be called to the ordering clinician or representative by the Radiologist Assistant, and communication documented in the PACS or zVision Dashboard.   Electronically Signed   By: Christiana Pellant M.D.   On: 03/09/2015 19:53   Dg Chest Port 1 View  03/11/2015   CLINICAL DATA:  Respiratory failure, diabetes mellitus  EXAM: PORTABLE CHEST - 1 VIEW  COMPARISON:  Portable exam 0544 hours compared to 03/10/2015  FINDINGS: VP shunt tubing traverses RIGHT chest.  Tip of tracheostomy tube projects 4.4 cm above carina.  RIGHT arm PICC line tip projects over SVC.  Scoliosis and thoracic deformity.  Upper normal heart size.  Persistent atelectasis versus consolidation in medial RIGHT lower lobe.  Probable small layered RIGHT pleural effusion.  New subsegmental atelectasis LEFT base.  No pneumothorax.  IMPRESSION: Persistent opacity at the medial RIGHT lower lobe with new subsegmental  atelectasis at LEFT base.   Electronically Signed   By: Ulyses Southward M.D.   On: 03/11/2015 07:38   Dg Chest Port 1 View  03/10/2015   CLINICAL DATA:  Respiratory failure, history diabetes mellitus  EXAM: PORTABLE CHEST - 1 VIEW  COMPARISON:  Portable exam 0605 hours compared 03/09/2015  FINDINGS: RIGHT arm PICC line tip projects over SVC.  Shunt tubing traverses RIGHT chest.  Tracheostomy tube projects over tracheal air column with tip 3.8 cm above carina.  Normal heart size and mediastinal contours for technique.  Persistent opacity in RIGHT hemi thorax, likely a combination of pleural effusion and basilar atelectasis versus consolidation, little changed when accounting for differences in technique.  LEFT lung clear.  Thoracolumbar scoliosis and osseous demineralization again seen.  IMPRESSION: Persistent RIGHT pleural effusion with atelectasis versus consolidation in RIGHT lung.   Electronically Signed   By: Ulyses Southward M.D.   On: 03/10/2015 07:10   Dg Chest Port 1 View  03/09/2015   CLINICAL DATA:  Respiratory failure  EXAM: PORTABLE CHEST - 1 VIEW  COMPARISON:  03/09/2015  FINDINGS: Tracheostomy appliance appears unremarkable. Right upper extremity PICC line appears satisfactorily positioned. There is improved aeration on the right although there still is significant right hemi thorax opacity which probably represents a combination of right lung hypoventilation and right pleural effusion. Left lung is clear. No pneumothorax is evident.  IMPRESSION: Improved, with some aeration in the right lung although significant right hemithorax opacity persists.   Electronically Signed   By: Ellery Plunk M.D.   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 Was treated with ceftazidime but changed to meropenem once sensis back on acinetobacter. However he  developed a mild  rash so meropenem stopped and now on cipro IV.  Has had recurrent issues with mucus plugging (the R side on 8/30) and has had 2 bronchs.  Cultures from bronch showing Pseudomonas (sensitive to cipro) He continues to have fevers, abundant tracheal secretions and xray findings.   Recommendations Continue IV ciprofloxacin but increase to pseudomonal dosing with 400 IV q 8(ordered)  - continue  IV benadryl 30 min prior to dosing  I have asked micro lab to check sensitivity to minocycline but this will take several days and the minocycline will not cover pseudomonas  Given repeat fever - check bcx x 2.   I suspect he is aspirating given the high TF residual and CXR fidings.  I have discussed with the micro lab doing anaerobic culture on the trach asp which we usually do not do, however given his drug allergies I want to see if we can identify instead of empiric treatment.   If worsens would add clinda for aspiration but high risk for  C diff.  Thank you very much for the consult. Will follow with you.  Roger Tate   03/11/2015, 12:47 PM

## 2015-03-11 NOTE — Progress Notes (Signed)
Patient vitals stable.  ST on monitor. No fever today- though blood culture and sputum culture collected to fever during the night.  Pt to start chest PT tomorrow.  Tolerating Cipro-no reaction- Tolerating tube feeds- no residuals.

## 2015-03-11 NOTE — Progress Notes (Addendum)
RASS 0 No overt distress Remains vent dependent on high FiO2/PEEP  Filed Vitals:   03/11/15 1000 03/11/15 1100 03/11/15 1200 03/11/15 1300  BP: 114/67 143/103 116/71   Pulse: 118 118 108   Temp:    98.6 F (37 C)  TempSrc:    Axillary  Resp: Height:      Weight:      SpO2: 95% 92% 92%    NAD HEENT WNL Trach site clean Diffuse rhonchi, moderate mucopurulent secretions Tachy, reg, no M Abd soft, G-tube site clean Contractures, no edema  BMP Latest Ref Rng 03/11/2015 03/10/2015 03/09/2015  Glucose 65 - 99 mg/dL 161(W) 960(A) 540(J)  BUN 6 - 20 mg/dL Creatinine 0.61 - 1.24 mg/dL 8.11(B) 1.47(W) 2.95(A)  Sodium 135 - 145 mmol/L 135 137 137  Potassium 3.5 - 5.1 mmol/L 4.1 3.9 4.0  Chloride 101 - 111 mmol/L 101 104 104  CO2 22 - 32 mmol/L Calcium 8.9 - 10.3 mg/dL 2.1(H) 8.9 9.2    CBC Latest Ref Rng 03/11/2015 03/10/2015 03/08/2015  WBC 3.8 - 10.6 K/uL 13.0(H) 18.6(H) 10.9(H)  Hemoglobin 13.0 - 18.0 g/dL 10.3(L) 11.4(L) 13.0  Hematocrit 40.0 - 52.0 % 30.3(L) 32.7(L) 37.3(L)  Platelets 150 - 440 K/uL 206 222 220    CXR: genralized haziness of R hemi-thorax  IMPRESSION: Remote TBI Chronic trach and nocturnal ventilation Chronic G-tube Acinetobacter PNA and bacteremia Severe, recurrent mucus plugging VDRF - still on high vent, FiO2 requirements reactive sinus tachycardia, improved  PLAN/REC: Cont full vent support - settings reviewed and/or adjusted Cont vent bundle Daily SBT if/when meets criteria Cont chest percussion and frequent trach suctioning Cont nebulized steroids and albuterol Monitor BMET intermittently Monitor I/Os Correct electrolytes as indicated cont TFs Abx per ID Sampson Goon) Father updated over phone. He indicated that he wishes to take pt home today. I strongly discouraged that as I do not believe that his care requirements can be provided in home environment  CCM time: 35 mins The above time includes time spent in  consultation with patient and/or family members and reviewing care plan on multidisciplinary rounds   Billy Fischer, MD PCCM service Mobile [phone] Pager [phone]    ADD: I spoke with father again in person this afternoon. He expressed extreme frustration and even anger over lack of improvement. I again explained that I do not think that the level of care currently required can be safely provided @ home. He reports that manual chest physiotherapy is most effective @ home and I have ordered this for here. I have asked him to demonstrate to our RTs how they perform this @ home. He has stated adamantly that he wishes to take [facility] (09/03) morning. To this end, I have asked him to bring in the home ventilator so that we may place Melissa on this tomorrow to ensure that it can provide sufficient support.  Billy Fischer, MD PCCM service Mobile [phone] Pager [phone]

## 2015-03-11 NOTE — Progress Notes (Signed)
[facility] - [place] at [place] [facility]   PATIENT NAME: Roger Tate    MR#:  [id number]  DATE OF BIRTH:  08/16/1990  SUBJECTIVE:  CHIEF COMPLAINT:   Chief Complaint  Patient presents with  . Blood Infection   - Patient is s/p TBI, bed bound at baseline, parents at bedside. - Blood cultures from 03/06/2015 growing Acinetobacter . - Patient remains on 85% FiO2, PEEP decreased to 10 -Still febrile, had a temperature of 10 63F this morning  REVIEW OF SYSTEMS:  Review of Systems  Unable to perform ROS: critical illness    DRUG ALLERGIES:   Allergies  Allergen Reactions  . Sulfa Antibiotics Anaphylaxis  . Zosyn [Piperacillin-Tazobactam In Dex] Anaphylaxis  . Ciprofloxacin Other (See Comments)    HYPOTENSION  . Lorazepam Other (See Comments)    Per pt's father. Reaction unknown. Diazepam and clonazepam OK  . Vancomycin   . Penicillins Rash    VITALS:  Blood pressure 104/63, pulse 119, temperature 99.9 F (37.7 C), temperature source Oral, resp. rate 19, height 5\' 5"  (1.651 m), weight 73.029 kg (161 lb), SpO2 97 %.  PHYSICAL EXAMINATION:  Physical Exam  GENERAL:  24 y.o.-year-old patient lying in the bed, bedbound, contracted, with no acute distress. Gets anxious when trying to examine EYES: Pupils equal, round, reactive to light and accommodation. No scleral icterus.  HEENT: Head atraumatic, normocephalic. Oropharynx and nasopharynx clear.  NECK:  Supple, no jugular venous distention. No thyroid enlargement, no tenderness.  Trach in place. LUNGS: Coarse breath sounds bilaterally with rhonchi. no wheezing, rales, or crepitation. No use of accessory muscles of respiration.  CARDIOVASCULAR: S1, S2 normal. No murmurs, rubs, or gallops.  ABDOMEN: Soft, nontender, nondistended. Bowel sounds present. No organomegaly or mass. PEG tube in place EXTREMITIES: 2+ pedal edema, No cyanosis, or clubbing. Atrophic extremities NEUROLOGIC: Eye contact. Nodding head  sometimes. Not following commands. No facial droop, alert and interactive at baseline per family. Quadriplegic and bed bound. PSYCHIATRIC: Patient is alert.  SKIN: No obvious rash, lesion, or ulcer. Right dorsum hand and forearm skin red blotch   LABORATORY PANEL:   CBC  Recent Labs Lab 03/11/15 0408  WBC 13.0*  HGB 10.3*  HCT 30.3*  PLT 206   ------------------------------------------------------------------------------------------------------------------  Chemistries   Recent Labs Lab 03/11/15 0408  NA 135  K 4.1  CL 101  CO2 27  GLUCOSE 241*  BUN 10  CREATININE 0.41*  CALCIUM 8.6*   ------------------------------------------------------------------------------------------------------------------  Cardiac Enzymes No results for input(s): TROPONINI in the last 168 hours. ------------------------------------------------------------------------------------------------------------------  RADIOLOGY:  Dg Chest 1 View  03/09/2015   CLINICAL DATA:  Central line placement  EXAM: CHEST  1 VIEW  COMPARISON:  03/09/2015  FINDINGS: The patient is rotated to the right. Right-sided PICC line tip terminates over the upper SVC. Tracheostomy tube is appropriately positioned. Heart size is normal. The left lung is grossly clear. Presumed ventriculoperitoneal shunt catheter tubing partly visualized over the right hemi thorax, contiguous in its visualized aspects. There is new total opacification of the right hemi thorax.  IMPRESSION: New total opacification of the right hemi thorax highly suspicious for lung collapse which could indicate aspiration or airway occlusion at the level of the right mainstem bronchus. Pleural effusion, less likely hemothorax in the setting of recent line placement, could also appear similar.  These results will be called to the ordering clinician or representative by the Radiologist Assistant, and communication documented in the PACS or zVision Dashboard.    Electronically Signed  By: Christiana Pellant M.D.   On: 03/09/2015 19:53   Dg Chest 1 View  03/09/2015   CLINICAL DATA:  Ventilation pneumonitis.  EXAM: CHEST  1 VIEW  COMPARISON:  03/06/2015  FINDINGS: Tracheostomy tube unchanged and in adequate position. Lungs are hypoinflated as there is opacification over the medial right base which may be due to atelectasis/ effusion, although cannot exclude infection. Resolved left base opacification. Cardiomediastinal silhouette and remainder of the exam is unchanged.  IMPRESSION: Opacification over the medial right base which may be due to atelectasis/effusion, although cannot exclude infection.  Tracheostomy tube unchanged.   Electronically Signed   By: Elberta Fortis M.D.   On: 03/09/2015 10:35   Dg Chest Port 1 View  03/11/2015   CLINICAL DATA:  Respiratory failure, diabetes mellitus  EXAM: PORTABLE CHEST - 1 VIEW  COMPARISON:  Portable exam 0544 hours compared to 03/10/2015  FINDINGS: VP shunt tubing traverses RIGHT chest.  Tip of tracheostomy tube projects 4.4 cm above carina.  RIGHT arm PICC line tip projects over SVC.  Scoliosis and thoracic deformity.  Upper normal heart size.  Persistent atelectasis versus consolidation in medial RIGHT lower lobe.  Probable small layered RIGHT pleural effusion.  New subsegmental atelectasis LEFT base.  No pneumothorax.  IMPRESSION: Persistent opacity at the medial RIGHT lower lobe with new subsegmental atelectasis at LEFT base.   Electronically Signed   By: Ulyses Southward M.D.   On: 03/11/2015 07:38   Dg Chest Port 1 View  03/10/2015   CLINICAL DATA:  Respiratory failure, history diabetes mellitus  EXAM: PORTABLE CHEST - 1 VIEW  COMPARISON:  Portable exam 0605 hours compared 03/09/2015  FINDINGS: RIGHT arm PICC line tip projects over SVC.  Shunt tubing traverses RIGHT chest.  Tracheostomy tube projects over tracheal air column with tip 3.8 cm above carina.  Normal heart size and mediastinal contours for technique.  Persistent  opacity in RIGHT hemi thorax, likely a combination of pleural effusion and basilar atelectasis versus consolidation, little changed when accounting for differences in technique.  LEFT lung clear.  Thoracolumbar scoliosis and osseous demineralization again seen.  IMPRESSION: Persistent RIGHT pleural effusion with atelectasis versus consolidation in RIGHT lung.   Electronically Signed   By: Ulyses Southward M.D.   On: 03/10/2015 07:10   Dg Chest Port 1 View  03/09/2015   CLINICAL DATA:  Respiratory failure  EXAM: PORTABLE CHEST - 1 VIEW  COMPARISON:  03/09/2015  FINDINGS: Tracheostomy appliance appears unremarkable. Right upper extremity PICC line appears satisfactorily positioned. There is improved aeration on the right although there still is significant right hemi thorax opacity which probably represents a combination of right lung hypoventilation and right pleural effusion. Left lung is clear. No pneumothorax is evident.  IMPRESSION: Improved, with some aeration in the right lung although significant right hemithorax opacity persists.   Electronically Signed   By: Ellery Plunk M.D.   On: 03/09/2015 22:53    EKG:   Orders placed or performed during the hospital encounter of 01/14/15  . EKG 12-Lead  . EKG 12-Lead  . 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  1. Sepsis- - positive blood cultures- Acinetobacter- from 03/06/15 - repeat blood cultures from 03/07/15 so far are negative - bronch and lavage and cultures  growing gram-negative rods - ID consult appreciated - Due to allergic reaction to meropenem, started on Cipro with Benadryl -Continues to spike fevers. - will need 2 weeks ABX - UA with no infection -If Blood cultures remain negative, PICC line will  be placed.  2. Acute hypoxic respiratory failure--secondary to pneumonia,? Collapse left lung on admission - s/p chronic trach, now on 85% fio2, PEEP decreased to 10 today - appreciate pulmonary consult - s/p bronch 03/08/15 done, CXR with improvement of left side followed by that and followed by Repeat bronch again on 03/09/2015, right sided mucus plug and consolidation - WBC elevated as well - cont vent support.  3. TBI and h/o seizure disorder- on keppra and depakote-doses adjusted as recent outpatient levels were high. Discussed with Dr. Malvin Johns Also on baclofen for muscle spasms.  4. DM-Cont home dose of as needed novolog On tube feeds-on hold this morning due to high residuals. Will be to restart it later again.  5. Anxiety-cont home meds  6. DVT prophylaxis- lovenox   All the records are reviewed and case discussed with Care Management/Social Workerr. Management plans discussed with the patient, family and they are in agreement.  CODE STATUS: FULL CODE  TOTAL CRITICAL CARE TIME SPENT IN TAKING CARE OF THIS PATIENT: 35 minutes.   Enid Baas M.D on 03/11/2015 at 8:14 AM  Between 7am to 6pm - Pager - [phone]  After 6pm go to [email or web address] - password [email or web address] [facility]  [facility] [facility]  Office  [phone]  CC: Primary care physician; Dorothey Baseman, MD

## 2015-03-11 NOTE — Progress Notes (Signed)
Inpatient Diabetes Program Recommendations  AACE/ADA: New Consensus Statement on Inpatient Glycemic Control (2013)  Target Ranges:  Prepandial:   less than 140 mg/dL      Peak postprandial:   less than 180 mg/dL (1-2 hours)      Critically ill patients:  140 - 180 mg/dL  Results for IVEY, CINA (MRN [id number]) as of 03/11/2015 09:04  Ref. Range 03/08/2015 08:35 03/10/2015 08:12 03/10/2015 11:29 03/10/2015 16:05 03/10/2015 20:57 03/11/2015 00:31 03/11/2015 04:40  Glucose-Capillary Latest Ref Range: 65-99 mg/dL 119 (H) 147 (H) 829 (H) 147 (H) 173 (H) 178 (H) 224 (H)   Outpatient Diabetes medications: Novolog 2 units PRN if glucose is greater than 200 mg/dl Current orders for Inpatient glycemic control: Novolog 2 units PRN  Inpatient Diabetes Program Recommendations Correction (SSI): Patient is receiving tube feedings which is contributing to hyperglycemia. Patient has NOT RECEIVED any Novolog correction. Please discontinue current Novolog 2 units PRN for high glucose order and use Glycemic Control order set to order Novolog 0-9 units Q4H.  Thanks, Orlando Penner, RN, MSN, CCRN, CDE Diabetes Coordinator Inpatient Diabetes Program [phone] (Team Pager from 8am to 5pm) [phone] ([place] office) [phone] [facility] office) [phone] [facility] office)

## 2015-03-12 ENCOUNTER — Inpatient Hospital Stay: Payer: Medicaid Other

## 2015-03-12 DIAGNOSIS — J9819 Other pulmonary collapse: Secondary | ICD-10-CM | POA: Insufficient documentation

## 2015-03-12 LAB — CBC
HEMATOCRIT: 29.9 % — AB (ref 40.0–52.0)
HEMOGLOBIN: 10.1 g/dL — AB (ref 13.0–18.0)
MCH: 31.4 pg (ref 26.0–34.0)
MCHC: 33.7 g/dL (ref 32.0–36.0)
MCV: 93.2 fL (ref 80.0–100.0)
Platelets: 229 10*3/uL (ref 150–440)
RBC: 3.21 MIL/uL — ABNORMAL LOW (ref 4.40–5.90)
RDW: 13.9 % (ref 11.5–14.5)
WBC: 12.3 10*3/uL — ABNORMAL HIGH (ref 3.8–10.6)

## 2015-03-12 LAB — BASIC METABOLIC PANEL
Anion gap: 7 (ref 5–15)
BUN: 8 mg/dL (ref 6–20)
CALCIUM: 8.4 mg/dL — AB (ref 8.9–10.3)
CHLORIDE: 96 mmol/L — AB (ref 101–111)
CO2: 28 mmol/L (ref 22–32)
CREATININE: 0.32 mg/dL — AB (ref 0.61–1.24)
GFR calc Af Amer: >60 mL/min (ref >60)
GFR calc non Af Amer: >60 mL/min (ref >60)
GLUCOSE: 277 mg/dL — AB (ref 65–99)
Potassium: 4.1 mmol/L (ref 3.5–5.1)
Sodium: 131 mmol/L — ABNORMAL LOW (ref 135–145)

## 2015-03-12 LAB — GLUCOSE, CAPILLARY
GLUCOSE-CAPILLARY: 251 mg/dL — AB (ref 65–99)
GLUCOSE-CAPILLARY: 310 mg/dL — AB (ref 65–99)
Glucose-Capillary: 241 mg/dL — ABNORMAL HIGH (ref 65–99)
Glucose-Capillary: 262 mg/dL — ABNORMAL HIGH (ref 65–99)
Glucose-Capillary: 312 mg/dL — ABNORMAL HIGH (ref 65–99)
Glucose-Capillary: 312 mg/dL — ABNORMAL HIGH (ref 65–99)

## 2015-03-12 LAB — CULTURE, BLOOD (ROUTINE X 2)
CULTURE: NO GROWTH
Culture: NO GROWTH

## 2015-03-12 MED ORDER — MIDAZOLAM HCL 2 MG/2ML IJ SOLN
4.0000 mg | Freq: Once | INTRAMUSCULAR | Status: AC
  Administered 2015-03-12: 2 mg via INTRAVENOUS
  Filled 2015-03-12: qty 4

## 2015-03-12 MED ORDER — ACETYLCYSTEINE 20 % IN SOLN
3.0000 mL | Freq: Four times a day (QID) | RESPIRATORY_TRACT | Status: DC
  Filled 2015-03-12 (×4): qty 4

## 2015-03-12 MED ORDER — FENTANYL CITRATE (PF) 100 MCG/2ML IJ SOLN: 50.0000 ug | Freq: Once | INTRAMUSCULAR | Status: DC

## 2015-03-12 MED ORDER — ACETYLCYSTEINE 20 % IN SOLN
3.0000 mL | Freq: Four times a day (QID) | RESPIRATORY_TRACT | Status: AC
  Administered 2015-03-12: 4 mL via RESPIRATORY_TRACT
  Administered 2015-03-12 – 2015-03-14 (×8): 3 mL via RESPIRATORY_TRACT
  Administered 2015-03-15: 30 mL via RESPIRATORY_TRACT
  Administered 2015-03-15: 3 mL via RESPIRATORY_TRACT
  Filled 2015-03-12 (×10): qty 4

## 2015-03-12 MED ORDER — INSULIN GLARGINE 100 UNIT/ML ~~LOC~~ SOLN
20.0000 [IU] | Freq: Every day | SUBCUTANEOUS | Status: DC
  Administered 2015-03-12: 20 [IU] via SUBCUTANEOUS
  Filled 2015-03-12 (×3): qty 0.2

## 2015-03-12 MED ORDER — ACETYLCYSTEINE 20 % IN SOLN
10.0000 mL | Freq: Once | RESPIRATORY_TRACT | Status: AC
  Administered 2015-03-12: 10 mL via RESPIRATORY_TRACT
  Filled 2015-03-12: qty 10

## 2015-03-12 MED ORDER — CLINDAMYCIN PHOSPHATE 600 MG/50ML IV SOLN
600.0000 mg | Freq: Three times a day (TID) | INTRAVENOUS | Status: DC
  Administered 2015-03-12 – 2015-03-17 (×15): 600 mg via INTRAVENOUS
  Filled 2015-03-12 (×19): qty 50

## 2015-03-12 NOTE — Progress Notes (Signed)
[facility] CLINIC INFECTIOUS DISEASE PROGRESS NOTE Date of Admission:  03/07/2015     ID: Roger Tate is a 24 y.o. male with acinetobacter bacteremia  Active Problems:   Bacteremia   Lung collapse  Subjective: He remains on vent - , requiring repeated suctioning with lots of secretions.  Had bronch this am again for removal of thick mucopurulent secretions and large plug iN l main bronchus. Temp to 101 again.  Seems to be tolerating ciprofloxacin.    ROS  Eleven systems are reviewed and negative except per hpi  Medications:  Antibiotics Given (last 72 hours)    Date/Time Action Medication Dose Rate   03/09/15 1550 Given   meropenem (MERREM) 1 g in sodium chloride 0.9 % 100 mL IVPB 1 g 200 mL/hr   03/10/15 1819 Given   ciprofloxacin (CIPRO) IVPB 400 mg 400 mg 200 mL/hr   03/11/15 0223 Given   ciprofloxacin (CIPRO) IVPB 400 mg 400 mg 200 mL/hr   03/11/15 1532 Given   ciprofloxacin (CIPRO) IVPB 400 mg 400 mg 200 mL/hr   03/11/15 2145 Given   ciprofloxacin (CIPRO) IVPB 400 mg 400 mg 200 mL/hr   03/12/15 4098 Given   ciprofloxacin (CIPRO) IVPB 400 mg 400 mg 200 mL/hr   03/12/15 1333 Given   ciprofloxacin (CIPRO) IVPB 400 mg 400 mg 200 mL/hr     . acetylcysteine  3 mL Nebulization Q6H  . albuterol  2.5 mg Inhalation Q6H  . antiseptic oral rinse  7 mL Mouth Rinse QID  . baclofen  10 mg Per Tube BID  . budesonide  0.5 mg Inhalation BID  . calcium carbonate  1 tablet Oral Daily  . chlorhexidine gluconate  15 mL Mouth Rinse BID  . ciprofloxacin  400 mg Intravenous Q8H  . clotrimazole   Topical BID  . diphenhydrAMINE  25 mg Intravenous Q12H  . enoxaparin (LOVENOX) injection  40 mg Subcutaneous Q24H  . fentaNYL (SUBLIMAZE) injection  50 mcg Intravenous Once  . free water  200 mL Per Tube 3 times per day  . insulin aspart  0-9 Units Subcutaneous 6 times per day  . insulin glargine  20 Units Subcutaneous Daily  . levETIRAcetam  1,500 mg Per Tube BID  . loratadine  10 mg Oral Daily   . multivitamin with minerals   Oral Daily  . nystatin cream  1 application Topical TID  . ranitidine  150 mg Per Tube BID  . Valproic Acid  500 mg Per Tube TID  . vitamin C  250 mg Oral Daily    Objective: Vital signs in last 24 hours: Temp:  [98.9 F (37.2 C)-101.3 F (38.5 C)] 99.2 F (37.3 C) (09/02 1300) Pulse Rate:  [107-152] 111 (09/02 1300) Resp:  [16-31] 19 (09/02 1300) BP: (103-140)/(63-88) 115/67 mmHg (09/02 1300) SpO2:  [87 %-100 %] 95 % (09/02 1300) FiO2 (%):  [70 %-80 %] 70 % (09/02 1225) GENERAL: 24 y.o.-year-old patient lying in the bed, bedbound, contracted, with no acute distress. Gets anxious when trying to examine EYES: Pupils equal, round, reactive to light and accommodation. No scleral icterus.  HEENT: Head atraumatic, normocephalic. Oropharynx and nasopharynx clear.  NECK: Supple, no jugular venous distention. No thyroid enlargement, no tenderness.  Trach in place. LUNGS: Coarse breath sounds bilaterally with rhonchi. no wheezing, rales, or crepitation. No use of accessory muscles of respiration.  CARDIOVASCULAR: S1, S2 normal. No murmurs, rubs, or gallops.  PICC RUE ABDOMEN: Soft, nontender, nondistended. Bowel sounds present. No organomegaly or mass. PEG  tube in place EXTREMITIES: 2+ pedal edema, No cyanosis, or clubbing. Atrophic extremities NEUROLOGIC: Eye contact. Nodding head sometimes. Not following commands. No facial droop, alert and interactive at baseline per family. Quadriplegic and bed bound. PSYCHIATRIC: Patient is alert.  SKIN: No obvious rash, lesion, or ulcer. Right dorsum hand and forearm skin red blotch  Lab Results  Recent Labs  03/11/15 0408 03/12/15 0458  WBC 13.0* 12.3*  HGB 10.3* 10.1*  HCT 30.3* 29.9*  NA 135 131*  K 4.1 4.1  CL 101 96*  CO2 27 28  BUN 10 8  CREATININE 0.41* 0.32*    Microbiology: Results for orders placed or performed during the hospital encounter of 03/07/15  Blood culture (routine x 2)      Status: None   Collection Time: 03/07/15  3:00 PM  Result Value Ref Range Status   Specimen Description BLOOD LEFT ASSIST CONTROL  Final   Special Requests   Final    BOTTLES DRAWN AEROBIC AND ANAEROBIC  AER 5CC ANA 2CC   Culture NO GROWTH 5 DAYS  Final   Report Status 03/12/2015 FINAL  Final  Blood culture (routine x 2)     Status: None   Collection Time: 03/07/15  4:42 PM  Result Value Ref Range Status   Specimen Description BLOOD LEFT ARM  Final   Special Requests BOTTLES DRAWN AEROBIC AND ANAEROBIC  4CC  Final   Culture NO GROWTH 5 DAYS  Final   Report Status 03/12/2015 FINAL  Final  Urine culture     Status: None   Collection Time: 03/07/15  6:13 PM  Result Value Ref Range Status   Specimen Description URINE, CLEAN CATCH  Final   Special Requests NONE  Final   Culture NO GROWTH  Final   Report Status 03/08/2015 FINAL  Final  MRSA PCR Screening     Status: None   Collection Time: 03/07/15  7:43 PM  Result Value Ref Range Status   MRSA by PCR NEGATIVE NEGATIVE Final    Comment:        The GeneXpert MRSA Assay (FDA approved for NASAL specimens only), is one component of a comprehensive MRSA colonization surveillance program. It is not intended to diagnose MRSA infection nor to guide or monitor treatment for MRSA infections.   Culture, bal-quantitative     Status: None   Collection Time: 03/08/15  2:50 PM  Result Value Ref Range Status   Specimen Description BRONCHIAL ALVEOLAR LAVAGE  Final   Special Requests NONE  Final   Gram Stain   Final    GOOD SPECIMEN - 80-90% WBCS MODERATE WBC SEEN RARE GRAM POSITIVE COCCI IN PAIRS RARE GRAM NEGATIVE RODS    Culture LIGHT GROWTH PSEUDOMONAS AERUGINOSA  Final   Report Status 03/11/2015 FINAL  Final   Organism ID, Bacteria PSEUDOMONAS AERUGINOSA  Final      Susceptibility   Pseudomonas aeruginosa - MIC*    CEFTAZIDIME Value in next row Sensitive      SENSITIVE2    CIPROFLOXACIN Value in next row Sensitive       SENSITIVE0.5    GENTAMICIN Value in next row Resistant      RESISTANT>=16    IMIPENEM Value in next row Resistant      RESISTANT>=16    PIP/TAZO Value in next row Sensitive      SENSITIVE<=4    CEFEPIME Value in next row Sensitive      SENSITIVE8    * LIGHT GROWTH PSEUDOMONAS AERUGINOSA  Culture,  blood (routine x 2)     Status: None (Preliminary result)   Collection Time: 03/11/15  1:43 PM  Result Value Ref Range Status   Specimen Description Blood  Final   Special Requests Normal  Final   Culture NO GROWTH < 24 HOURS  Final   Report Status PENDING  Incomplete  Culture, blood (routine x 2)     Status: None (Preliminary result)   Collection Time: 03/11/15  1:50 PM  Result Value Ref Range Status   Specimen Description BLOOD LEFT HAND  Final   Special Requests   Final    BOTTLES DRAWN AEROBIC AND ANAEROBIC 5CC AERP 3 CC ANAERO   Culture NO GROWTH < 24 HOURS  Final   Report Status PENDING  Incomplete  Culture, blood (single)     Status: None (Preliminary result)   Collection Time: 03/11/15  5:00 PM  Result Value Ref Range Status   Specimen Description BLOOD RIGHT ASSIST CONTROL  Final   Special Requests BOTTLES DRAWN AEROBIC AND ANAEROBIC  Final   Culture NO GROWTH < 24 HOURS  Final   Report Status PENDING  Incomplete    Studies/Results: Dg Chest Port 1 View  03/12/2015   CLINICAL DATA:  Respiratory failure, tracheostomy 2  EXAM: PORTABLE CHEST - 1 VIEW  COMPARISON:  Portable chest x-ray of March 11, 2015  FINDINGS: The patient is markedly rotated on this study. There has developed opacification throughout much of the left hemi thorax since yesterday's study. A small amount of aerated left lung persists in the perihilar region. The right lung is well-expanded. The interstitial markings are increased on the right. The cardiac silhouette is obscured. The pulmonary vascularity is mildly prominent centrally.  IMPRESSION: 1. Interval near total opacification of the left hemithorax as  compared to yesterday's study which may reflect mucus plugging or aspiration. Fairly stable interstitial density at the right lung base. 2. The endotracheal tube tip lies 4.7 cm above the carina. 3. These results were called by telephone at the time of interpretation on 03/12/2015 at 7:55 am to Huel Coventry, RN,who verbally acknowledged these results.   Electronically Signed   By: Ailany Koren  [place] M.D.   On: 03/12/2015 07:56   Dg Chest Port 1 View  03/11/2015   CLINICAL DATA:  Respiratory failure, diabetes mellitus  EXAM: PORTABLE CHEST - 1 VIEW  COMPARISON:  Portable exam 0544 hours compared to 03/10/2015  FINDINGS: VP shunt tubing traverses RIGHT chest.  Tip of tracheostomy tube projects 4.4 cm above carina.  RIGHT arm PICC line tip projects over SVC.  Scoliosis and thoracic deformity.  Upper normal heart size.  Persistent atelectasis versus consolidation in medial RIGHT lower lobe.  Probable small layered RIGHT pleural effusion.  New subsegmental atelectasis LEFT base.  No pneumothorax.  IMPRESSION: Persistent opacity at the medial RIGHT lower lobe with new subsegmental atelectasis at LEFT base.   Electronically Signed   By: Ulyses Southward M.D.   On: 03/11/2015 07:38    Assessment/Plan: Caylor Tallarico is a 24 y.o. male with TBI, home trach admtted with bloody sputum, Acinetobacter bacteremia, and white out lung on L s/p suctioning and Bronch 8/29. Was treated with ceftazidime but changed to meropenem once sensis back on acinetobacter. However he developed a mild rash so meropenem stopped and now on cipro IV.  Has had recurrent issues with mucus plugging (the R side on 8/30) and has had 2 bronchs.  Cultures from bronch showing Pseudomonas (sensitive to cipro) He continues to have  fevers, abundant tracheal secretions and xray findings. Given repeat fever he had bcx x 2  9/1 and these are negative to date I suspect he is aspirating given the high TF residual and CXR fidings.  I have discussed with the micro lab  doing anaerobic culture on the trach asp which we usually do not do, however given his drug allergies I want to see if we can identify instead of empiric treatment.   - I have also asked micro lab to check sensitivity to minocycline but this will take several days and the minocycline will not cover pseudomonas   Recommendations Continue IV ciprofloxacin at  pseudomonal dosing with 400 IV q 8(ordered).   - continue  IV benadryl 30 min prior to dosing  Since fevers persist I will add clindamycin for a 7 day course. Can be given IV at first but then changed to oral 300 po q 8 hrs at DC  I have completed a home IV abx order sheet in a separate progress note as it is mentioned he will be going home tomorrow at his father's insistence. He will need a 21 day course for the pseudomonas - stop date 9/18. However I do not think all of this needs to be IV. He can cont IV until 9/10 then have picc pulled and start cipro 500 po q 8 until 9/18. He can be switched to oral clinda at dc 300 q 8 until 9/10  Thank you very much for the consult. Will follow with you.  Roger Tate   03/12/2015, 1:58 PM

## 2015-03-12 NOTE — Progress Notes (Signed)
RASS 0 No overt distress Remains vent dependent on high FiO2/PEEP Recurrent L lung collapse  Filed Vitals:   03/12/15 0727 03/12/15 0800 03/12/15 0900 03/12/15 1000  BP:  124/69 117/70 121/71  Pulse:  124 110 108  Temp:      TempSrc:      Resp:  Height:      Weight:      SpO2: 97% 100% 99% 100%   NAD HEENT WNL Trach site clean Absent BS on L, rhonchi on R, moderate mucopurulent secretions Tachy, reg, no M Abd soft, G-tube site clean Contractures, no edema  BMP Latest Ref Rng 03/12/2015 03/11/2015 03/10/2015  Glucose 65 - 99 mg/dL 161(W) 960(A) 540(J)  BUN 6 - 20 mg/dL Creatinine 0.61 - 1.24 mg/dL 8.11(B) 1.47(W) 2.95(A)  Sodium 135 - 145 mmol/L 131(L) 135 137  Potassium 3.5 - 5.1 mmol/L 4.1 4.1 3.9  Chloride 101 - 111 mmol/L 96(L) 101 104  CO2 22 - 32 mmol/L Calcium 8.9 - 10.3 mg/dL 2.1(H) 0.8(M) 8.9    CBC Latest Ref Rng 03/12/2015 03/11/2015 03/10/2015  WBC 3.8 - 10.6 K/uL 12.3(H) 13.0(H) 18.6(H)  Hemoglobin 13.0 - 18.0 g/dL 10.1(L) 10.3(L) 11.4(L)  Hematocrit 40.0 - 52.0 % 29.9(L) 30.3(L) 32.7(L)  Platelets 150 - 440 K/uL 229 206 222    CXR: L lung collapse  IMPRESSION: Remote TBI Chronic trach and chronic nocturnal ventilation Chronic G-tube Acinetobacter PNA and bacteremia Severe, recurrent mucus plugging Recurrent L lung collapse  FOB 8/29, 9/02  VDRF - still on high vent, FiO2 requirements reactive sinus tachycardia, improved  PLAN/REC: Cont full vent support - settings reviewed and/or adjusted Cont vent bundle Daily SBT if/when meets criteria Cont chest percussion, bed and manual, and frequent trach suctioning Cont nebulized steroids and albuterol Monitor BMET intermittently Monitor I/Os Correct electrolytes as indicated cont TFs Abx per ID Sampson Goon) Father updated over phone.   CCM time: 35 mins The above time includes time spent in consultation with patient and/or family members and reviewing care plan on  multidisciplinary rounds   Billy Fischer, MD PCCM service Mobile [phone] Pager [phone]

## 2015-03-12 NOTE — Procedures (Signed)
Bronchoscopy Procedure Note Md Smola [id number] [birth year]  Procedure: Bronchoscopy Indications: Remove secretions  Procedure Details Consent: Risks of procedure as well as the alternatives and risks of each were explained to the (patient/caregiver).  Consent for procedure obtained. Time Out: Verified patient identification, verified procedure, site/side was marked, verified correct patient position, special equipment/implants available, medications/allergies/relevent history reviewed, required imaging and test results available.  Performed  In preparation for procedure, patient was given 100% FiO2 and bronchoscope lubricated. Sedation: Benzodiazepines  Airway entered and the following bronchi were examined: RUL, RML, RLL, LUL, LLL and Bronchi.   Procedures performed: Washings and removal of copious, very thick mucopurulent secretions, Large plug in L main bronchus Bronchoscope removed.    Evaluation Hemodynamic Status: BP stable throughout; O2 sats: stable throughout Patient's Current Condition: stable Specimens:  Gram's stain and culture Complications: No apparent complications Patient did tolerate procedure well.   Billy Fischer 03/12/2015

## 2015-03-12 NOTE — Progress Notes (Signed)
Pt currently resting under full vent support.  Bronch today at bedside, pt tolerated well. Father visited and has been updated on current status.  No apparent distress.  According to father, pt is more alert and communicating better today than previously.

## 2015-03-12 NOTE — Progress Notes (Signed)
Initial Nutrition Assessment    INTERVENTION:   EN: continue TF as tolerated at current goal rate  NUTRITION DIAGNOSIS:   Increased nutrient needs related to acute illness as evidenced by estimated needs.  GOAL:   Patient will meet greater than or equal to 90% of their needs   MONITOR:    (Energy intake, Digestive system, Electrolyte and renal profile)   ASSESSMENT:     Pt remains on vent via trach, bronch today with very thick secretions, large plug in left main bronchus   EN: tolerating Vital High Protein at rate of 65 ml/hr  Digestive System: no signs of TF intolerance, +BM 9/1  Electrolyte and Renal Profile:  Recent Labs Lab 03/10/15 0537 03/11/15 0408 03/12/15 0458  BUN CREATININE 0.54* 0.41* 0.32*  NA 137 135 131*  K 3.9 4.1 4.1   Glucose Profile:  Recent Labs  03/12/15 0423 03/12/15 0751 03/12/15 1226  GLUCAP 241* 312* 251*   Nutritional Anemia Profile:  CBC Latest Ref Rng 03/12/2015 03/11/2015 03/10/2015  WBC 3.8 - 10.6 K/uL 12.3(H) 13.0(H) 18.6(H)  Hemoglobin 13.0 - 18.0 g/dL 10.1(L) 10.3(L) 11.4(L)  Hematocrit 40.0 - 52.0 % 29.9(L) 30.3(L) 32.7(L)  Platelets 150 - 440 K/uL 229 206 222    Meds: ss novolog, lactulose, MVI  Height:   Ht Readings from Last 1 Encounters:  03/07/15  (1.651 m)    Weight:   Wt Readings from Last 1 Encounters:  03/07/15 161 lb (73.029 kg)    BMI:  Body mass index is 26.79 kg/(m^2).  Estimated Nutritional Needs:   Kcal:  TEE 1931 kcals/d (Ve 9.4, Tmax 37.5)   Protein:  (1.2-2.0 g/kg) 88-146 g/d  Fluid:  (30-32ml/kg) 7829-5621 ml/d  EDUCATION NEEDS:   No education needs identified at this time  HIGH Care Level  Romelle Starcher MS, RD, LDN [phone] Pager

## 2015-03-12 NOTE — Progress Notes (Signed)
Infectious Disease Long Term IV Antibiotic Orders  Diagnosis Acinetobacter bacteremia Pseudomonal PNA Possible aspiration PNA  Culture results Results for orders placed or performed during the hospital encounter of 03/07/15  Blood culture (routine x 2)     Status: None   Collection Time: 03/07/15  3:00 PM  Result Value Ref Range Status   Specimen Description BLOOD LEFT ASSIST CONTROL  Final   Special Requests   Final    BOTTLES DRAWN AEROBIC AND ANAEROBIC  AER 5CC ANA 2CC   Culture NO GROWTH 5 DAYS  Final   Report Status 03/12/2015 FINAL  Final  Blood culture (routine x 2)     Status: None   Collection Time: 03/07/15  4:42 PM  Result Value Ref Range Status   Specimen Description BLOOD LEFT ARM  Final   Special Requests BOTTLES DRAWN AEROBIC AND ANAEROBIC  4CC  Final   Culture NO GROWTH 5 DAYS  Final   Report Status 03/12/2015 FINAL  Final  Urine culture     Status: None   Collection Time: 03/07/15  6:13 PM  Result Value Ref Range Status   Specimen Description URINE, CLEAN CATCH  Final   Special Requests NONE  Final   Culture NO GROWTH  Final   Report Status 03/08/2015 FINAL  Final  MRSA PCR Screening     Status: None   Collection Time: 03/07/15  7:43 PM  Result Value Ref Range Status   MRSA by PCR NEGATIVE NEGATIVE Final    Comment:        The GeneXpert MRSA Assay (FDA approved for NASAL specimens only), is one component of a comprehensive MRSA colonization surveillance program. It is not intended to diagnose MRSA infection nor to guide or monitor treatment for MRSA infections.   Culture, bal-quantitative     Status: None   Collection Time: 03/08/15  2:50 PM  Result Value Ref Range Status   Specimen Description BRONCHIAL ALVEOLAR LAVAGE  Final   Special Requests NONE  Final   Gram Stain   Final    GOOD SPECIMEN - 80-90% WBCS MODERATE WBC SEEN RARE GRAM POSITIVE COCCI IN PAIRS RARE GRAM NEGATIVE RODS    Culture LIGHT GROWTH PSEUDOMONAS AERUGINOSA  Final   Report Status 03/11/2015 FINAL  Final   Organism ID, Bacteria PSEUDOMONAS AERUGINOSA  Final      Susceptibility   Pseudomonas aeruginosa - MIC*    CEFTAZIDIME Value in next row Sensitive      SENSITIVE2    CIPROFLOXACIN Value in next row Sensitive      SENSITIVE0.5    GENTAMICIN Value in next row Resistant      RESISTANT>=16    IMIPENEM Value in next row Resistant      RESISTANT>=16    PIP/TAZO Value in next row Sensitive      SENSITIVE<=4    CEFEPIME Value in next row Sensitive      SENSITIVE8    * LIGHT GROWTH PSEUDOMONAS AERUGINOSA  Culture, blood (routine x 2)     Status: None (Preliminary result)   Collection Time: 03/11/15  1:43 PM  Result Value Ref Range Status   Specimen Description Blood  Final   Special Requests Normal  Final   Culture NO GROWTH < 24 HOURS  Final   Report Status PENDING  Incomplete  Culture, blood (routine x 2)     Status: None (Preliminary result)   Collection Time: 03/11/15  1:50 PM  Result Value Ref Range Status   Specimen Description BLOOD  LEFT HAND  Final   Special Requests   Final    BOTTLES DRAWN AEROBIC AND ANAEROBIC 5CC AERP 3 CC ANAERO   Culture NO GROWTH < 24 HOURS  Final   Report Status PENDING  Incomplete  Culture, blood (single)     Status: None (Preliminary result)   Collection Time: 03/11/15  5:00 PM  Result Value Ref Range Status   Specimen Description BLOOD RIGHT ASSIST CONTROL  Final   Special Requests BOTTLES DRAWN AEROBIC AND ANAEROBIC 3ML  Final   Culture NO GROWTH < 24 HOURS  Final   Report Status PENDING  Incomplete     Allergies:  Allergies  Allergen Reactions  . Sulfa Antibiotics Anaphylaxis  . Zosyn [Piperacillin-Tazobactam In Dex] Anaphylaxis  . Ciprofloxacin Other (See Comments)    HYPOTENSION  . Lorazepam Other (See Comments)    Per pt's father. Reaction unknown. Diazepam and clonazepam OK  . Vancomycin   . Penicillins Rash    Discharge antibiotics Ciprofloxacin  400 IV q 8 hours until 9/10 then 500 mg po  q 8 hours until 9/18 Clindamycin 300 mg po q 8 hours until 9/10 then stop   PICC Care per protocol Labs weekly while on IV antibiotics      CBC w diff   Comprehensive met panel  Follow up clinic date TBD  FAX weekly labs to [phone]  Leonel Ramsay, MD

## 2015-03-12 NOTE — Progress Notes (Signed)
[facility] - [place] at [facility]   PATIENT NAME: Roger Tate    MR#:  [id number]  DATE OF BIRTH:  Nov 20, 1990  SUBJECTIVE:  CHIEF COMPLAINT:   Chief Complaint  Patient presents with  . 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 Admitted for aspiration pneumonitis. Bronchoalveolar lavage revealed light growth of Pseudomonas aeruginosa resistant to imipenem, gentamicin. Blood cultures 28th of August 2016 showed no growth, repeated on 02/08/2015 also showed no growth - Blood cultures from 03/06/2015 grew Acinetobacter, per history . - Patient remains on 80% FiO2, PEEP decreased to 10 -Still febrile, had a temperature of 10 63F last night and every night. Status post bronchoscopy today on 02/09/2015 by Dr. Darrol Angel,  large mucous plug was eliminated from left main bronchus.   REVIEW OF SYSTEMS:  Review of Systems  Unable to perform ROS: critical illness    DRUG ALLERGIES:   Allergies  Allergen Reactions  . Sulfa Antibiotics Anaphylaxis  . Zosyn [Piperacillin-Tazobactam In Dex] Anaphylaxis  . Ciprofloxacin Other (See Comments)    HYPOTENSION  . Lorazepam Other (See Comments)    Per pt's father. Reaction unknown. Diazepam and clonazepam OK  . Vancomycin   . Penicillins Rash    VITALS:  Blood pressure 115/67, pulse 111, temperature 99.2 F (37.3 C), temperature source Oral, resp. rate 19, height 5\' 5"  (1.651 m), weight 73.029 kg (161 lb), SpO2 95 %.  PHYSICAL EXAMINATION:  Physical Exam  GENERAL:  24 y.o.-year-old patient lying in the bed, bedbound, contracted, with no acute distress. Sedated, not able to get much response  EYES: Pupils equal, round, reactive to light and accommodation. No scleral icterus.  HEENT: Head atraumatic, normocephalic. Oropharynx and nasopharynx clear.  NECK:  Supple, no jugular venous distention. No thyroid enlargement, no tenderness.  Trach in place. LUNGS: Coarse breath sounds bilaterally with  rhonchi. no wheezing, rales, or crepitation. No use of accessory muscles of respiration. There is good air entrance bilaterally CARDIOVASCULAR: S1, S2 normal. No murmurs, rubs, or gallops.  ABDOMEN: Soft, nontender, nondistended. Bowel sounds present. No organomegaly or mass. PEG tube in place EXTREMITIES: 2+ pedal edema, No cyanosis, or clubbing. Atrophic extremities NEUROLOGIC: Eye contact. Nodding head sometimes. Not following commands. No facial droop, alert and interactive at baseline per family. Quadriplegic and bed bound. PSYCHIATRIC: Patient is alert.  SKIN: No obvious rash, lesion, or ulcer. Right dorsum hand and forearm skin red blotch Somnolent today. Not able to interact  LABORATORY PANEL:   CBC  Recent Labs Lab 03/12/15 0458  WBC 12.3*  HGB 10.1*  HCT 29.9*  PLT 229   ------------------------------------------------------------------------------------------------------------------  Chemistries   Recent Labs Lab 03/12/15 0458  NA 131*  K 4.1  CL 96*  CO2 28  GLUCOSE 277*  BUN 8  CREATININE 0.32*  CALCIUM 8.4*   ------------------------------------------------------------------------------------------------------------------  Cardiac Enzymes No results for input(s): TROPONINI in the last 168 hours. ------------------------------------------------------------------------------------------------------------------  RADIOLOGY:  Dg Chest Port 1 View  03/12/2015   CLINICAL DATA:  Respiratory failure, tracheostomy 2  EXAM: PORTABLE CHEST - 1 VIEW  COMPARISON:  Portable chest x-ray of March 11, 2015  FINDINGS: The patient is markedly rotated on this study. There has developed opacification throughout much of the left hemi thorax since yesterday's study. A small amount of aerated left lung persists in the perihilar region. The right lung is well-expanded. The interstitial markings are increased on the right. The cardiac silhouette is obscured. The pulmonary  vascularity is mildly prominent  centrally.  IMPRESSION: 1. Interval near total opacification of the left hemithorax as compared to yesterday's study which may reflect mucus plugging or aspiration. Fairly stable interstitial density at the right lung base. 2. The endotracheal tube tip lies 4.7 cm above the carina. 3. These results were called by telephone at the time of interpretation on 03/12/2015 at 7:55 am to Huel Coventry, RN,who verbally acknowledged these results.   Electronically Signed   By: David  Swaziland M.D.   On: 03/12/2015 07:56   Dg Chest Port 1 View  03/11/2015   CLINICAL DATA:  Respiratory failure, diabetes mellitus  EXAM: PORTABLE CHEST - 1 VIEW  COMPARISON:  Portable exam 0544 hours compared to 03/10/2015  FINDINGS: VP shunt tubing traverses RIGHT chest.  Tip of tracheostomy tube projects 4.4 cm above carina.  RIGHT arm PICC line tip projects over SVC.  Scoliosis and thoracic deformity.  Upper normal heart size.  Persistent atelectasis versus consolidation in medial RIGHT lower lobe.  Probable small layered RIGHT pleural effusion.  New subsegmental atelectasis LEFT base.  No pneumothorax.  IMPRESSION: Persistent opacity at the medial RIGHT lower lobe with new subsegmental atelectasis at LEFT base.   Electronically Signed   By: Ulyses Southward M.D.   On: 03/11/2015 07:38    EKG:   Orders placed or performed during the hospital encounter of 01/14/15  . EKG 12-Lead  . EKG 12-Lead  . 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  1. Sepsis due to Acinetobacter- - positive blood cultures- Acinetobacter- from 03/06/15 - repeat blood cultures from 03/07/15 so far are negative - bronch and lavage and cultures growing Pseudomonas , now on the ciprofloxacin IV - ID  consult appreciated - Due to allergic reaction to meropenem, started on Cipro with Benadryl -Continues to spike fevers, could be due to atelectasis, now status post bronchoscopy on 03/12/2015 by Dr. Darrol Angel during which large amount of secretions were suctioned from left main bronchus. - will need 2 weeks ABX - UA with no infection -As Blood cultures remain negative, PICC line should be placed.  2. Acute hypoxic respiratory failure--secondary to pneumonia,? Collapse left lung on admission - s/p chronic trach, now on 80% % fio2, PEEP decreased to 10 today - appreciate pulmonary consult - s/p bronch 03/08/15 done, CXR with improvement of left side followed by that and followed by Repeat bronch again on 03/09/2015, right sided mucus plug and consolidation, most recent bronchoscopy second of September 2016 by Dr. Darrol Angel mucous plug from left main bronchus was removed , now on Mucomyst inhalations, other cell count overall is improving daily - WBC elevated as well - cont vent support.  3. TBI and h/o seizure disorder- on keppra and depakote-doses adjusted as recent outpatient levels were high. Discussed with Dr. Malvin Johns Also on baclofen for muscle spasms. Stable. No intervention  4. DM-Cont Lantus at 20 units home dose of as needed novolog On tube feeds, had to be  held yesterday due to high residual, however, restarted again  5. Anxiety-cont home meds  6. DVT prophylaxis- lovenox   All the records are reviewed and case discussed with Care Management/Social Workerr. Management plans discussed with the patient, family and they are in agreement.  CODE STATUS: FULL CODE Discussed with nursing staff TOTAL CRITICAL CARE  TIME SPENT IN TAKING CARE OF THIS PATIENT: 35 minutes.   Katharina Caper M.D on 03/12/2015 at 1:29 PM  Between 7am to 6pm - Pager - [phone]  After 6pm go to [email or web address] - password [email or web address] [facility]  [facility]  Office  [phone]  CC: Primary care physician;  Dorothey Baseman, MD

## 2015-03-13 ENCOUNTER — Inpatient Hospital Stay: Payer: Medicaid Other

## 2015-03-13 LAB — CBC
HCT: 30 % — ABNORMAL LOW (ref 40.0–52.0)
Hemoglobin: 10.1 g/dL — ABNORMAL LOW (ref 13.0–18.0)
MCH: 31.2 pg (ref 26.0–34.0)
MCHC: 33.7 g/dL (ref 32.0–36.0)
MCV: 92.7 fL (ref 80.0–100.0)
PLATELETS: 260 10*3/uL (ref 150–440)
RBC: 3.24 MIL/uL — AB (ref 4.40–5.90)
RDW: 13.7 % (ref 11.5–14.5)
WBC: 12.5 10*3/uL — AB (ref 3.8–10.6)

## 2015-03-13 LAB — GLUCOSE, CAPILLARY
GLUCOSE-CAPILLARY: 135 mg/dL — AB (ref 65–99)
GLUCOSE-CAPILLARY: 238 mg/dL — AB (ref 65–99)
GLUCOSE-CAPILLARY: 264 mg/dL — AB (ref 65–99)
GLUCOSE-CAPILLARY: 276 mg/dL — AB (ref 65–99)
GLUCOSE-CAPILLARY: 311 mg/dL — AB (ref 65–99)
Glucose-Capillary: 169 mg/dL — ABNORMAL HIGH (ref 65–99)
Glucose-Capillary: 244 mg/dL — ABNORMAL HIGH (ref 65–99)
Glucose-Capillary: 280 mg/dL — ABNORMAL HIGH (ref 65–99)

## 2015-03-13 LAB — BASIC METABOLIC PANEL
ANION GAP: 7 (ref 5–15)
BUN: 10 mg/dL (ref 6–20)
CALCIUM: 8.8 mg/dL — AB (ref 8.9–10.3)
CO2: 29 mmol/L (ref 22–32)
Chloride: 95 mmol/L — ABNORMAL LOW (ref 101–111)
Creatinine, Ser: 0.35 mg/dL — ABNORMAL LOW (ref 0.61–1.24)
GFR calc Af Amer: >60 mL/min (ref >60)
GLUCOSE: 309 mg/dL — AB (ref 65–99)
POTASSIUM: 4.3 mmol/L (ref 3.5–5.1)
SODIUM: 131 mmol/L — AB (ref 135–145)

## 2015-03-13 MED ORDER — IPRATROPIUM-ALBUTEROL 0.5-2.5 (3) MG/3ML IN SOLN
3.0000 mL | Freq: Four times a day (QID) | RESPIRATORY_TRACT | Status: DC
  Administered 2015-03-13 – 2015-03-18 (×20): 3 mL via RESPIRATORY_TRACT
  Filled 2015-03-13 (×21): qty 3

## 2015-03-13 MED ORDER — INSULIN GLARGINE 100 UNIT/ML ~~LOC~~ SOLN
35.0000 [IU] | Freq: Every day | SUBCUTANEOUS | Status: DC
  Administered 2015-03-14: 35 [IU] via SUBCUTANEOUS
  Filled 2015-03-13 (×2): qty 0.35

## 2015-03-13 MED ORDER — CIPROFLOXACIN IN D5W 400 MG/200ML IV SOLN
400.0000 mg | Freq: Three times a day (TID) | INTRAVENOUS | Status: DC
  Administered 2015-03-13 – 2015-03-16 (×9): 400 mg via INTRAVENOUS
  Filled 2015-03-13 (×13): qty 200

## 2015-03-13 MED ORDER — INSULIN GLARGINE 100 UNIT/ML ~~LOC~~ SOLN
25.0000 [IU] | Freq: Every day | SUBCUTANEOUS | Status: DC
  Administered 2015-03-13: 25 [IU] via SUBCUTANEOUS
  Filled 2015-03-13 (×2): qty 0.25

## 2015-03-13 NOTE — Progress Notes (Addendum)
[facility] Physicians - [place] at [facility]   PATIENT NAME: Roger Tate    MR#:  [id number]  DATE OF BIRTH:  [birth year]  SUBJECTIVE:  CHIEF COMPLAINT:   Chief Complaint  Patient presents with  . 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 Admitted for aspiration pneumonitis. Bronchoalveolar lavage revealed light growth of Pseudomonas aeruginosa resistant to imipenem, gentamicin. Blood cultures 28th of August 2016 showed no growth, repeated on 02/08/2015 also showed no growth - Blood cultures from 03/06/2015 grew Acinetobacter, per history . - Patient remains on 80% FiO2, PEEP decreased to 10 -Still febrile, had a temperature of 103F last night and every night. Status post bronchoscopy today on 02/09/2015 by Dr. Darrol Angel,  large mucous plug was eliminated from left main bronchus. Oxygen saturations have improved and now on 60% FiO2 with 5 cm of PEEP. Not able to provide review of systems since patient is intubated. Very short of breath, hypoxic when moved around in bed. Tracheal fluid is being cultured for anaerobic bacteria, moderate growth of gram-negative rods is noted in bronchial alveolar lavage, ID pending. Now on ciprofloxacin and clindamycin.   REVIEW OF SYSTEMS:  Review of Systems  Unable to perform ROS: critical illness    DRUG ALLERGIES:   Allergies  Allergen Reactions  . Sulfa Antibiotics Anaphylaxis  . Zosyn [Piperacillin-Tazobactam In Dex] Anaphylaxis  . Ciprofloxacin Other (See Comments)    HYPOTENSION  . Lorazepam Other (See Comments)    Per pt's father. Reaction unknown. Diazepam and clonazepam OK  . Vancomycin   . Penicillins Rash    VITALS:  Blood pressure 121/83, pulse 125, temperature 100.6 F (38.1 C), temperature source Oral, resp. rate 19, height  (1.651 m), weight 73.029 kg (161 lb), SpO2 98 %.  PHYSICAL EXAMINATION:  Physical Exam  GENERAL:  24 y.o.-year-old patient lying in the bed, bedbound, contracted,  in moderate to severe respiratory distress. Struggling to breathe and gasping, tachypneic EYES: Pupils equal, round, reactive to light and accommodation. No scleral icterus.  HEENT: Head atraumatic, normocephalic. Oropharynx and nasopharynx clear.  NECK:  Supple, no jugular venous distention. No thyroid enlargement, no tenderness.  Trach in place. LUNGS: Coarse breath sounds bilaterally with rhonchi. Scattered bilateral wheezing, few rales at bases , no crepitation. Using accessory muscles of respiration. Poor air entrance at bases,  better on the right CARDIOVASCULAR: S1, S2 normal. No murmurs, rubs, or gallops.  ABDOMEN: Soft, nontender, nondistended. Bowel sounds present. No organomegaly or mass. PEG tube in place EXTREMITIES: 2+ pedal edema, No cyanosis, or clubbing. Atrophic extremities NEUROLOGIC: Eye contact. Nodding head sometimes. Not following commands. No facial droop, alert and interactive at baseline per family. Quadriplegic and bed bound. PSYCHIATRIC: Patient is alert.  SKIN: No obvious rash, lesion, or ulcer. Right dorsum hand and forearm skin red blotch Somnolent today. Not able to interact  LABORATORY PANEL:   CBC  Recent Labs Lab 03/13/15 0954  WBC 12.5*  HGB 10.1*  HCT 30.0*  PLT 260   ------------------------------------------------------------------------------------------------------------------  Chemistries   Recent Labs Lab 03/13/15 0954  NA 131*  K 4.3  CL 95*  CO2 29  GLUCOSE 309*  BUN 10  CREATININE 0.35*  CALCIUM 8.8*   ------------------------------------------------------------------------------------------------------------------  Cardiac Enzymes No results for input(s): TROPONINI in the last 168 hours. ------------------------------------------------------------------------------------------------------------------  RADIOLOGY:  Dg Chest Port 1 View  03/13/2015   CLINICAL DATA:  Respiratory failure.  Bacterial infection  EXAM: PORTABLE  CHEST - 1 VIEW  COMPARISON:  Radiograph 03/12/2015  FINDINGS: Tracheostomy tube unchanged in position. Improvement aeration to the LEFT upper lobe compared to prior. There is increased atelectasis in the RIGHT lung with increased central venous congestion. No pneumothorax. Bibasilar atelectasis versus infiltrates.  IMPRESSION: 1. Tracheostomy tube unchanged. 2. Improvement aeration to the LEFT lung with clearing of the LEFT upper lobe. 3. Increased atelectasis in the RIGHT lung. 4. Bibasilar atelectasis versus infiltrates.   Electronically Signed   By: Genevive Bi M.D.   On: 03/13/2015 07:49   Dg Chest Port 1 View  03/12/2015   CLINICAL DATA:  Respiratory failure, tracheostomy 2  EXAM: PORTABLE CHEST - 1 VIEW  COMPARISON:  Portable chest x-ray of March 11, 2015  FINDINGS: The patient is markedly rotated on this study. There has developed opacification throughout much of the left hemi thorax since yesterday's study. A small amount of aerated left lung persists in the perihilar region. The right lung is well-expanded. The interstitial markings are increased on the right. The cardiac silhouette is obscured. The pulmonary vascularity is mildly prominent centrally.  IMPRESSION: 1. Interval near total opacification of the left hemithorax as compared to yesterday's study which may reflect mucus plugging or aspiration. Fairly stable interstitial density at the right lung base. 2. The endotracheal tube tip lies 4.7 cm above the carina. 3. These results were called by telephone at the time of interpretation on 03/12/2015 at 7:55 am to Huel Coventry, RN,who verbally acknowledged these results.   Electronically Signed   By: David  [place] M.D.   On: 03/12/2015 07:56    EKG:   Orders placed or performed during the hospital encounter of 01/14/15  . EKG 12-Lead  . EKG 12-Lead  . 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  1. Sepsis due to Acinetobacter- - positive blood cultures- Acinetobacter- from 03/06/15 - repeat blood cultures from 03/07/15 and from the  first of September 2016 so far are negative - bronch and lavage and cultures growing Pseudomonas , now on the ciprofloxacin IV, repeated bronchoscopy, bronchoalveolar lavage, also shows moderate growth of gram-negative rods. ID to follow likely pseudomonas, patient continues to spike high fevers, rule out anaerobic infection versus fungal - ID consult appreciated - Due to allergic reaction to meropenem, started on Cipro with Benadryl -Continues to spike fevers, could be due to atelectasis, now status post bronchoscopy on 03/12/2015 by Dr. Darrol Angel during which large amount of secretions were suctioned from left main bronchus. Chest x-ray today third of September 2016 revealed improvement of aeration left lung clearing of left upper lobe abnormalities. However, increased atelectasis in the right lung was noted - will need 2 weeks ABX - UA with no infection -As Blood cultures remain negative, PICC line may need to be placed.  2. Acute on chronic hypoxic respiratory failure--secondary to pneumonia,? Collapse left lung on admission - s/p chronic trach, now on 60% % fio2, PEEP decreased to 5 today - appreciate pulmonary consult - s/p bronch 03/08/15 done, CXR with improvement of left side followed by that and followed by Repeat bronch again on 03/09/2015, right sided mucus plug and consolidation, most recent bronchoscopy second of September 2016 by Dr. Darrol Angel mucous plug from left main bronchus was removed , now on Mucomyst inhalations, white blood count cell count overall  is improving daily, following intermittently - WBC remains elevated , but stable - cont vent support.  3. TBI and h/o seizure disorder- on keppra and  depakote-doses adjusted as recent outpatient levels were high. Discussed with Dr. Malvin Johns in the past Also on baclofen for muscle spasms. Stable. No intervention  4. DM-Cont Lantus at 30 units and as needed novolog On tube feeds, had to be  held yesterday due to high residual, however, restarted again  5. Anxiety-cont home meds  6. DVT prophylaxis- lovenox 7. High fevers of unclear etiology. Blood cultures are negative so far, sputum cultures are growing Pseudomonas which is covered with ciprofloxacin. Anaerobic cultures are pending. However, patient is receiving clindamycin, may need to add anti-fungal medication, awaiting for Dr. Jarrett Ables recommendations  All the records are reviewed and case discussed with Care Management/Social Workerr. Management plans discussed with the patient, family and they are in agreement.  CODE STATUS: FULL CODE  prolonged discussion this patient's father for approximately 15 minutes about patient's medical condition, treatment plan,  lab studies, culture results. All questions were answered he voiced understanding.  Emotional support was provided TOTAL CRITICAL CARE TIME SPENT IN TAKING CARE OF THIS PATIENT:  75 minutes.   Katharina Caper M.D on 03/13/2015 at 12:39 PM  Between 7am to 6pm - Pager - [phone]  After 6pm go to [email or web address] - password [email or web address] [facility]  [place] [facility]  Office  [phone]  CC: Primary care physician; Dorothey Baseman, MD

## 2015-03-13 NOTE — Progress Notes (Signed)
RASS 0 No overt distress Remains vent dependent on high FiO2, PEEP weaned down to 5.  Recurrent L lung collapse  Doing better this am, appears more comfotable.   Filed Vitals:   03/13/15 0300 03/13/15 0400 03/13/15 0700 03/13/15 0743  BP: 119/68 119/70 120/70   Pulse: 116 118 102   Temp: 100.7 F (38.2 C)   100.6 F (38.1 C)  TempSrc:    Oral  Resp: Height:      Weight:      SpO2: 92% 97% 100%    NAD HEENT WNL Trach site clean Absent BS on L, rhonchi on R, moderate mucopurulent secretions Tachy, reg, no M Abd soft, G-tube site clean Contractures, no edema  BMP Latest Ref Rng 03/12/2015 03/11/2015 03/10/2015  Glucose 65 - 99 mg/dL 161(W) 960(A) 540(J)  BUN 6 - 20 mg/dL Creatinine 0.61 - 1.24 mg/dL 8.11(B) 1.47(W) 2.95(A)  Sodium 135 - 145 mmol/L 131(L) 135 137  Potassium 3.5 - 5.1 mmol/L 4.1 4.1 3.9  Chloride 101 - 111 mmol/L 96(L) 101 104  CO2 22 - 32 mmol/L Calcium 8.9 - 10.3 mg/dL 2.1(H) 0.8(M) 8.9    CBC Latest Ref Rng 03/12/2015 03/11/2015 03/10/2015  WBC 3.8 - 10.6 K/uL 12.3(H) 13.0(H) 18.6(H)  Hemoglobin 13.0 - 18.0 g/dL 10.1(L) 10.3(L) 11.4(L)  Hematocrit 40.0 - 52.0 % 29.9(L) 30.3(L) 32.7(L)  Platelets 150 - 440 K/uL 229 206 222    CXR: resolving L lung collapse  IMPRESSION: Remote TBI Chronic trach and chronic nocturnal ventilation Chronic G-tube Acinetobacter and pseudomonas PNA and bacteremia Severe, recurrent mucus plugging Recurrent left and right lung collapse due to mucus plugging.   FOB 8/29, 9/02  VDRF - still on high  FiO2 requirements reactive sinus tachycardia, improved  PLAN/REC: Cont full vent support - settings reviewed and/or adjusted Cont vent bundle Wean down FiO2 as tolerated.  Daily SBT if/when meets criteria Cont chest percussion, bed and manual, and frequent trach suctioning Can DC nebulized steroid, continue and albuterol, mucomyst, CPT.  Monitor BMET intermittently Monitor I/Os cont TFs Abx per ID  Sampson Goon)    Wells Guiles, M.D.  PCCM service

## 2015-03-13 NOTE — Progress Notes (Signed)
Pt remains on vent via trach FiO2 60% with O2 sats mid 90's; vss; adequate uop pt incontinent of urine; no c/o pain; tolerating tube feedings; sinus tach on cardiac monitor; temps and blood sugars improved during shift; pts father updated about plan of care and questions answered will continue to monitor and assess pt

## 2015-03-13 NOTE — Progress Notes (Signed)
Patients' dad, Brett Canales, called for an update. Patient just received CPT and is calm, watching tv. Dad suggested giving Tylenol to help son relax.

## 2015-03-14 ENCOUNTER — Inpatient Hospital Stay: Payer: Medicaid Other

## 2015-03-14 LAB — GLUCOSE, CAPILLARY
GLUCOSE-CAPILLARY: 262 mg/dL — AB (ref 65–99)
Glucose-Capillary: 215 mg/dL — ABNORMAL HIGH (ref 65–99)
Glucose-Capillary: 235 mg/dL — ABNORMAL HIGH (ref 65–99)
Glucose-Capillary: 239 mg/dL — ABNORMAL HIGH (ref 65–99)
Glucose-Capillary: 211 mg/dL — ABNORMAL HIGH (ref 65–99)
Glucose-Capillary: 199 mg/dL — ABNORMAL HIGH (ref 65–99)

## 2015-03-14 LAB — BASIC METABOLIC PANEL
Anion gap: 9 (ref 5–15)
BUN: 10 mg/dL (ref 6–20)
CALCIUM: 8.6 mg/dL — AB (ref 8.9–10.3)
CO2: 27 mmol/L (ref 22–32)
Chloride: 94 mmol/L — ABNORMAL LOW (ref 101–111)
GLUCOSE: 266 mg/dL — AB (ref 65–99)
Potassium: 4.2 mmol/L (ref 3.5–5.1)
Sodium: 130 mmol/L — ABNORMAL LOW (ref 135–145)

## 2015-03-14 LAB — CBC
HCT: 28 % — ABNORMAL LOW (ref 40.0–52.0)
Hemoglobin: 9.7 g/dL — ABNORMAL LOW (ref 13.0–18.0)
MCH: 31.8 pg (ref 26.0–34.0)
MCHC: 34.5 g/dL (ref 32.0–36.0)
MCV: 92.3 fL (ref 80.0–100.0)
PLATELETS: 261 10*3/uL (ref 150–440)
RBC: 3.03 MIL/uL — ABNORMAL LOW (ref 4.40–5.90)
RDW: 13.9 % (ref 11.5–14.5)
WBC: 9.8 10*3/uL (ref 3.8–10.6)

## 2015-03-14 MED ORDER — IBUPROFEN 100 MG/5ML PO SUSP
200.0000 mg | Freq: Four times a day (QID) | ORAL | Status: DC | PRN
  Administered 2015-03-14 – 2015-03-16 (×4): 200 mg via ORAL
  Filled 2015-03-14 (×6): qty 10

## 2015-03-14 MED ORDER — INSULIN GLARGINE 100 UNIT/ML ~~LOC~~ SOLN
45.0000 [IU] | Freq: Every day | SUBCUTANEOUS | Status: DC
  Administered 2015-03-15 – 2015-03-16 (×2): 45 [IU] via SUBCUTANEOUS
  Filled 2015-03-14 (×6): qty 0.45

## 2015-03-14 MED ORDER — GUAIFENESIN 100 MG/5ML PO SOLN
200.0000 mg | ORAL | Status: DC
  Administered 2015-03-14 – 2015-03-18 (×20): 200 mg via ORAL
  Filled 2015-03-14 (×32): qty 10

## 2015-03-14 NOTE — Progress Notes (Signed)
Rested on and off during shift. Father visited intermittently during shift along with other friends. VSS. Stach per cardiac monitor.

## 2015-03-14 NOTE — Progress Notes (Signed)
Patient's father requested that tube feeds be stopped so that patient can lay down and nap.  RN stopped tube feeds and patient is laying down at this time with father at bedside.

## 2015-03-14 NOTE — Progress Notes (Signed)
[facility] - [place] [facility]   PATIENT NAME: Roger Tate    MR#:  [id number]  DATE OF BIRTH:  Sep 17, 1990  SUBJECTIVE:  CHIEF COMPLAINT:   Chief Complaint  Patient presents with  . 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 Admitted for aspiration pneumonitis. Bronchoalveolar lavage revealed light growth of Pseudomonas aeruginosa resistant to imipenem, gentamicin. Blood cultures 28th of August 2016 showed no growth, repeated on 02/08/2015 also showed no growth - Blood cultures from 03/06/2015 grew Acinetobacter, per history . - Patient remains on 80% FiO2, PEEP decreased to 10 -Still febrile, had a temperature of  about102F last night and every night. Status post bronchoscopy today on 02/09/2015 by Dr. Darrol Angel,  large mucous plug was eliminated from left main bronchus. Oxygen saturations have improved and now on 50% FiO2 with 5 cm of PEEP. Not able to provide review of systems since patient is intubated. Very short of breath, hypoxic when moved around in bed. Tracheal fluid is being cultured for anaerobic bacteria, moderate growth of gram-negative rods is noted in bronchial alveolar lavage, ID pending. Now on ciprofloxacin and clindamycin.   Nurses report patient complaining of urination issues,  Possibly wanting to avoid and not able to.   Bladder scan was  Done, was found 250 cc,  not postvoidal  though .  Not able to review systems as patient is intubated REVIEW OF SYSTEMS:  Review of Systems  Unable to perform ROS: critical illness    DRUG ALLERGIES:   Allergies  Allergen Reactions  . Sulfa Antibiotics Anaphylaxis  . Zosyn [Piperacillin-Tazobactam In Dex] Anaphylaxis  . Ciprofloxacin Other (See Comments)    HYPOTENSION  . Lorazepam Other (See Comments)    Per pt's father. Reaction unknown. Diazepam and clonazepam OK  . Vancomycin   . Penicillins Rash    VITALS:  Blood pressure 116/72, pulse 118, temperature 101.8 F (38.8  C), temperature source Oral, resp. rate 25, height 5\' 5"  (1.651 m), weight 73.029 kg (161 lb), SpO2 96 %.  PHYSICAL EXAMINATION:  Physical Exam  GENERAL:  24 y.o.-year-old patient lying in the bed, bedbound, contracted, in  mild respiratory distress.  Tachycardic,  Tachypneic,  Suspected due to high fever EYES: Pupils equal, round, reactive to light and accommodation. No scleral icterus.  HEENT: Head atraumatic, normocephalic. Oropharynx and nasopharynx clear.  NECK:  Supple, no jugular venous distention. No thyroid enlargement, no tenderness.  Trach in place. LUNGS: Coarse breath sounds bilaterally with rhonchi. Scattered bilateral wheezing, few rales at bases , no crepitation. Using accessory muscles of respiration. Poor air entrance at bases,  better on the right CARDIOVASCULAR: S1, S2 normal. No murmurs, rubs, or gallops.  ABDOMEN: Soft, nontender, nondistended. Bowel sounds present. No organomegaly or mass. PEG tube in place EXTREMITIES: 2+ pedal edema, No cyanosis, or clubbing. Atrophic extremities NEUROLOGIC: Eye contact. Nodding head sometimes. Not following commands. No facial droop, alert and interactive at baseline per family. Quadriplegic and bed bound. PSYCHIATRIC: Patient is alert.  SKIN: No obvious rash, lesion, or ulcer. Right dorsum hand and forearm skin red blotch Somnolent today. Not able to interact  LABORATORY PANEL:   CBC  Recent Labs Lab 03/14/15 0533  WBC 9.8  HGB 9.7*  HCT 28.0*  PLT 261   ------------------------------------------------------------------------------------------------------------------  Chemistries   Recent Labs Lab 03/14/15 0533  NA 130*  K 4.2  CL 94*  CO2 27  GLUCOSE 266*  BUN 10  CREATININE <0.30*  CALCIUM 8.6*   ------------------------------------------------------------------------------------------------------------------  Cardiac Enzymes No results for input(s): TROPONINI in the last 168  hours. ------------------------------------------------------------------------------------------------------------------  RADIOLOGY:  Dg Chest 1 View  03/14/2015   CLINICAL DATA:  Respiratory failure.  Bacterial infection.  EXAM: CHEST  1 VIEW  COMPARISON:  03/13/2015 and prior radiographs  FINDINGS: A tracheostomy tube and right PICC line with tip overlying the lower SVC again noted.  Right lung airspace disease/ atelectasis has slightly improved. Improved left lower lung aeration also noted.  There is no evidence of pneumothorax.  No other interval changes identified.  IMPRESSION: Improved right lung airspace disease/ atelectasis and left lower lung aeration. No other significant change.   Electronically Signed   By: Harmon Pier M.D.   On: 03/14/2015 09:43   Dg Chest Port 1 View  03/13/2015   CLINICAL DATA:  Respiratory failure.  Bacterial infection  EXAM: PORTABLE CHEST - 1 VIEW  COMPARISON:  Radiograph 03/12/2015  FINDINGS: Tracheostomy tube unchanged in position. Improvement aeration to the LEFT upper lobe compared to prior. There is increased atelectasis in the RIGHT lung with increased central venous congestion. No pneumothorax. Bibasilar atelectasis versus infiltrates.  IMPRESSION: 1. Tracheostomy tube unchanged. 2. Improvement aeration to the LEFT lung with clearing of the LEFT upper lobe. 3. Increased atelectasis in the RIGHT lung. 4. Bibasilar atelectasis versus infiltrates.   Electronically Signed   By: Genevive Bi M.D.   On: 03/13/2015 07:49    EKG:   Orders placed or performed during the hospital encounter of 01/14/15  . EKG 12-Lead  . EKG 12-Lead  . 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  1. Sepsis due to Acinetobacter- - positive blood  cultures- Acinetobacter- from 03/06/15 - repeat blood cultures from 03/07/15 and from the  first of September 2016 so far are negative - bronch and lavage and cultures growing Pseudomonas , now on the ciprofloxacin IV, repeated bronchoscopy, bronchoalveolar lavage, also shows moderate growth of gram-negative rods. ID to follow,  likely pseudomonas, patient continues to spike high fevers, rule out anaerobic infection versus fungal - ID consult appreciated - Due to allergic reaction to meropenem, started on Cipro with Benadryl -Continues to spike fevers, could be due to atelectasis, now status post bronchoscopy on 03/12/2015 by Dr. Darrol Angel during which large amount of secretions were suctioned from left main bronchus. Chest x-ray today third of September 2016 revealed improvement of aeration left lung clearing of left upper lobe abnormalities. However, increased atelectasis in the right lung was noted.  Admitting Robitussin to   Facilitate sputum removal - will need 2 weeks ABX - UA with no infection -As Blood cultures remain negative, PICC line may need to be placed.  2. Acute on chronic hypoxic respiratory failure--secondary to pneumonia,? Collapse left lung on admission - s/p chronic trach, now on 50% % fio2, PEEP decreased to 5  September  third - appreciate pulmonary consult - s/p bronch 03/08/15 done, CXR with improvement of left side followed by that and followed by Repeat bronch again on 03/09/2015, right sided mucus plug and consolidation, most recent bronchoscopy second of September 2016 by Dr. Darrol Angel mucous plug from left main bronchus was removed , now on Mucomyst inhalations, white blood count cell count overall is improving daily, following intermittently - WBC remains elevated , but stable -  cont vent support.  Adding Robitussin  3. TBI and h/o seizure disorder- on keppra and depakote-doses adjusted as recent outpatient levels were high. Discussed with Dr. Malvin Johns in the past Also on  baclofen for muscle spasms. Stable. No intervention  4. DM- advance Lantus    To 45 units and as needed novolog On tube feeds, had to be  held  few days ago due to high residual, however, restarted again  5. Anxiety-cont home meds  6. DVT prophylaxis- lovenox  7. High fevers of unclear etiology. Blood cultures are negative so far, sputum cultures are growing Pseudomonas which is covered with ciprofloxacin. Anaerobic cultures are pending. However, patient is receiving clindamycin, may need to add anti-fungal medication, awaiting for Dr. Jarrett Ables recommendations.  Add Advil as needed  8.  Questionable dysuria  Versus voiding difficulty,  Get postvoid though bladder scan and place Foley catheter is if needed.   All the records are reviewed and case discussed with Care Management/Social Workerr. Management plans discussed with the patient, family and they are in agreement.  CODE STATUS: FULL CODE  Discussed with  patient's father yesterday extensively for approximately  15 minutes,  All questions were  answered. He voiced understanding.   TOTAL CRITICAL CARE TIME SPENT IN TAKING CARE OF THIS PATIENT: .   Katharina Caper M.D on 03/14/2015 at 10:43 AM  Between 7am to 6pm - Pager - [phone]  After 6pm go to [email or web address] - password [email or web address] [facility]  [place] [facility]  Office  [phone]  CC: Primary care physician; Dorothey Baseman, MD

## 2015-03-14 NOTE — Progress Notes (Addendum)
RASS 0 No overt distress Remains vent dependent on high FiO2, PEEP weaned down to 5.  Recurrent L and R lung collapse  Doing better this am, appears more comfotable. Review of this morning's chest x-ray report and images. In comparison with yesterday's chest x-ray shows continued improvement in the aeration of the bilateral lungs. The patient's FiO2 has been decreased from 60% to 50%, currently with an oxygen saturation of 96% on current vent settings.  Filed Vitals:   03/14/15 0600 03/14/15 0700 03/14/15 0800 03/14/15 0900  BP: 115/68 113/71 118/77 135/84  Pulse: 110 114 114 132  Temp:  100.5 F (38.1 C)    TempSrc:  Oral    Resp: 24 32 29 28  Height:      Weight:      SpO2: 93% 99% 100% 95%   NAD HEENT WNL Trach site clean Absent BS on L, rhonchi on R, moderate mucopurulent secretions Tachy, reg, no M Abd soft, G-tube site clean Contractures, no edema  BMP Latest Ref Rng 03/14/2015 03/13/2015 03/12/2015  Glucose 65 - 99 mg/dL 161(W) 960(A) 540(J)  BUN 6 - 20 mg/dL Creatinine 0.61 - 1.24 mg/dL <8.11(B) 1.47(W) 2.95(A)  Sodium 135 - 145 mmol/L 130(L) 131(L) 131(L)  Potassium 3.5 - 5.1 mmol/L 4.2 4.3 4.1  Chloride 101 - 111 mmol/L 94(L) 95(L) 96(L)  CO2 22 - 32 mmol/L Calcium 8.9 - 10.3 mg/dL 2.1(H) 0.8(M) 5.7(Q)    CBC Latest Ref Rng 03/14/2015 03/13/2015 03/12/2015  WBC 3.8 - 10.6 K/uL 9.8 12.5(H) 12.3(H)  Hemoglobin 13.0 - 18.0 g/dL 4.6(N) 10.1(L) 10.1(L)  Hematocrit 40.0 - 52.0 % 28.0(L) 30.0(L) 29.9(L)  Platelets 150 - 440 K/uL 261 260 229    CXR: resolving L lung collapse  IMPRESSION: Remote TBI Chronic trach and chronic nocturnal ventilation Chronic G-tube Acinetobacter and pseudomonas PNA and bacteremia Severe, recurrent mucus plugging Recurrent left and right lung collapse due to mucus plugging. Bilateral lung aeration has improved today. Oxygen saturation has improved on a lower FiO2  FOB 8/29, 9/02  VDRF - still on high  FiO2 requirements, but  improving reactive sinus tachycardia, improved  PLAN/REC: Cont full vent support - settings reviewed and/or adjusted Cont vent bundle Wean down FiO2 as tolerated.  Daily SBT if/when meets criteria Cont chest percussion, bed and manual, and frequent trach suctioning Discontinued nebulized steroid, continue  albuterol, mucomyst, CPT.  Monitor BMET intermittently Monitor I/Os cont TFs Abx per ID Sampson Goon)    Wells Guiles, M.D.  PCCM service

## 2015-03-15 ENCOUNTER — Inpatient Hospital Stay: Payer: Medicaid Other

## 2015-03-15 DIAGNOSIS — J9620 Acute and chronic respiratory failure, unspecified whether with hypoxia or hypercapnia: Secondary | ICD-10-CM

## 2015-03-15 DIAGNOSIS — J9819 Other pulmonary collapse: Secondary | ICD-10-CM

## 2015-03-15 LAB — BASIC METABOLIC PANEL
Anion gap: 8 (ref 5–15)
BUN: 7 mg/dL (ref 6–20)
CO2: 28 mmol/L (ref 22–32)
Calcium: 8.6 mg/dL — ABNORMAL LOW (ref 8.9–10.3)
Chloride: 92 mmol/L — ABNORMAL LOW (ref 101–111)
Glucose, Bld: 157 mg/dL — ABNORMAL HIGH (ref 65–99)
Potassium: 4.1 mmol/L (ref 3.5–5.1)
SODIUM: 128 mmol/L — AB (ref 135–145)

## 2015-03-15 LAB — CBC
HCT: 28.4 % — ABNORMAL LOW (ref 40.0–52.0)
Hemoglobin: 9.8 g/dL — ABNORMAL LOW (ref 13.0–18.0)
MCH: 31.5 pg (ref 26.0–34.0)
MCHC: 34.6 g/dL (ref 32.0–36.0)
MCV: 91.1 fL (ref 80.0–100.0)
PLATELETS: 280 10*3/uL (ref 150–440)
RBC: 3.11 MIL/uL — AB (ref 4.40–5.90)
RDW: 13.7 % (ref 11.5–14.5)
WBC: 11.3 10*3/uL — ABNORMAL HIGH (ref 3.8–10.6)

## 2015-03-15 LAB — GLUCOSE, CAPILLARY
GLUCOSE-CAPILLARY: 237 mg/dL — AB (ref 65–99)
Glucose-Capillary: 160 mg/dL — ABNORMAL HIGH (ref 65–99)
Glucose-Capillary: 257 mg/dL — ABNORMAL HIGH (ref 65–99)
Glucose-Capillary: 255 mg/dL — ABNORMAL HIGH (ref 65–99)
Glucose-Capillary: 278 mg/dL — ABNORMAL HIGH (ref 65–99)
Glucose-Capillary: 300 mg/dL — ABNORMAL HIGH (ref 65–99)

## 2015-03-15 LAB — CULTURE, BAL-QUANTITATIVE

## 2015-03-15 LAB — CULTURE, BAL-QUANTITATIVE W GRAM STAIN

## 2015-03-15 MED ORDER — VITAL 1.5 CAL PO LIQD
1000.0000 mL | ORAL | Status: DC
  Administered 2015-03-15 – 2015-03-17 (×3): 1000 mL

## 2015-03-15 MED ORDER — IOHEXOL 350 MG/ML SOLN
100.0000 mL | Freq: Once | INTRAVENOUS | Status: AC | PRN
  Administered 2015-03-15: 100 mL via INTRAVENOUS

## 2015-03-15 MED ORDER — FREE WATER
25.0000 mL | Freq: Three times a day (TID) | Status: DC
  Administered 2015-03-15 – 2015-03-18 (×9): 25 mL

## 2015-03-15 MED ORDER — PRO-STAT SUGAR FREE PO LIQD
30.0000 mL | Freq: Every day | ORAL | Status: DC
  Administered 2015-03-15 – 2015-03-18 (×4): 30 mL via ORAL

## 2015-03-15 NOTE — Progress Notes (Signed)
Pt requires increase in Fio2 after being turned in bed for bath, linen changes and cleaned following bowel movements.

## 2015-03-15 NOTE — Progress Notes (Signed)
Nutrition Follow-up   INTERVENTION:   EN: in past discussions, father has reported that pt does not tolerate bolus feedings. Discussed changing current 24 hour continuous feeding to an intermittent continuous feeding during ICU rounds. MD Kasa agreeable. Recommend changing to Vital 1.5 (more calorically dense) with goal rate of 75 ml/hr over 16 hour time frame (6am to 10pm)-pt would ideally benefit from volume based vs rate based TF regimen but unable to implement at present. Recommend addition of Prostat daily. Provides total of 1900 kcals, 97 g of protein, 912 mL of free water. Discussed hyponatremia (sodium trending down daily). MD Kasa requesting decrease in free water flushes; order tchanged to 25 mL q 8 hours. Discussed TF plan with Sarah RN   NUTRITION DIAGNOSIS:   Increased nutrient needs related to acute illness as evidenced by estimated needs. Being addressed via TF  GOAL:   Patient will meet greater than or equal to 90% of their needs   MONITOR:    (Energy intake, Digestive system, Electrolyte and renal profile)    ASSESSMENT:    Pt remains on vent via trach, alert  EN: tolerating Vital 1.2 TF at rate of 65 ml/hr; of note, TF on hold at times due to father and pt's request for pt to lie flat for napping  Digestive System: 5 mL residuals, no signs of TF intolerance  Last BM:  9/5  Electrolyte and Renal Profile:  Recent Labs Lab 03/13/15 0954 03/14/15 0533 03/15/15 0435  BUN CREATININE 0.35* <0.30* <0.30*  NA 131* 130* 128*  K 4.3 4.2 4.1   Glucose Profile:  Recent Labs  03/15/15 0421 03/15/15 0805 03/15/15 1103  GLUCAP 160* 257* 255*   Protein Profile: No results for input(s): ALBUMIN in the last 168 hours.   Meds: ss novolog, lantus, MVI  Height:   Ht Readings from Last 1 Encounters:  03/07/15  (1.651 m)    Weight:   Wt Readings from Last 1 Encounters:  03/07/15 161 lb (73.029 kg)    BMI:  Body mass index is 26.79  kg/(m^2).  Estimated Nutritional Needs:   Kcal:  TEE 1931 kcals/d (Ve 9.4, Tmax 37.5)   Protein:  (1.2-2.0 g/kg) 88-146 g/d  Fluid:  (30-61ml/kg) 4098-1191 ml/d  EDUCATION NEEDS:   No education needs identified at this time  HIGH Care Level  Romelle Starcher MS, RD, LDN [phone] Pager

## 2015-03-15 NOTE — Progress Notes (Signed)
RASS 0, more alert and awake this AM,  No overt distress Remains vent dependent on high FiO2, PEEP weaned down to 5.  Recurrent L and R lung collapse  Doing better this am, appears more comfotable.still with febrile episodes -will consider CT chest PE protocol  Filed Vitals:   03/15/15 0900 03/15/15 1000 03/15/15 1100 03/15/15 1103  BP: 116/62 112/80 123/81   Pulse: 127 130 142 131  Temp:      TempSrc:      Resp: Height:      Weight:      SpO2: 92% 96% 78% 100%   NAD HEENT WNL Trach site clean Absent BS on L, rhonchi on R, moderate mucopurulent secretions Tachy, reg, no M Abd soft, G-tube site clean Contractures, no edema  BMP Latest Ref Rng 03/15/2015 03/14/2015 03/13/2015  Glucose 65 - 99 mg/dL 161(W) 960(A) 540(J)  BUN 6 - 20 mg/dL Creatinine 0.61 - 1.24 mg/dL <8.11(B) <1.47(W) 2.95(A)  Sodium 135 - 145 mmol/L 128(L) 130(L) 131(L)  Potassium 3.5 - 5.1 mmol/L 4.1 4.2 4.3  Chloride 101 - 111 mmol/L 92(L) 94(L) 95(L)  CO2 22 - 32 mmol/L Calcium 8.9 - 10.3 mg/dL 2.1(H) 0.8(M) 5.7(Q)    CBC Latest Ref Rng 03/15/2015 03/14/2015 03/13/2015  WBC 3.8 - 10.6 K/uL 11.3(H) 9.8 12.5(H)  Hemoglobin 13.0 - 18.0 g/dL 4.6(N) 6.2(X) 10.1(L)  Hematocrit 40.0 - 52.0 % 28.4(L) 28.0(L) 30.0(L)  Platelets 150 - 440 K/uL 280 261 260    CXR: resolving R/L lung collapse  IMPRESSION: 24 yo white male with chronic vent dep resp failure with acute b/l pneumonia with failure to wean from vent With h/o TBI  Remote TBI Chronic trach and chronic nocturnal ventilation Chronic G-tube Acinetobacter and pseudomonas PNA and bacteremia Severe, recurrent mucus plugging Recurrent left and right lung collapse due to mucus plugging. Bilateral lung aeration has improved today. Oxygen saturation has improved on a lower FiO2  FOB 8/29, 9/02  VDRF - still on high  FiO2 requirements reactive sinus tachycardia, improved  PLAN/REC: Cont full vent support - settings reviewed and/or  adjusted Cont vent bundle Wean down FiO2 as tolerated.  Cont chest percussion, bed and manual, and frequent trach suctioning Discontinued nebulized steroid, continue  albuterol, mucomyst, CPT.  Monitor I/Os cont TFs Abx per ID Sampson Goon) -decrease h2o flushes in setting of hyponatremia    I have personally obtained a history, examined the patient, evaluated Pertinent laboratory and RadioGraphic/imaging results, and  formulated the assessment and plan   The Patient requires high complexity decision making for assessment and support, frequent evaluation and titration of therapies. Time Spent with patient 35 mins   Meloni Hinz Santiago Glad, M.D.  [facility]  Medical Director [facility] Medical Director [facility] Cardio-Pulmonary Department

## 2015-03-15 NOTE — Progress Notes (Signed)
[facility] - [place] at [facility]   PATIENT NAME: Roger Tate    MR#:  [id number]  DATE OF BIRTH:  Nov 11, 1990  SUBJECTIVE:  CHIEF COMPLAINT:   Chief Complaint  Patient presents with  . 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 Admitted for aspiration pneumonitis. Bronchoalveolar lavage revealed light growth of Pseudomonas aeruginosa resistant to imipenem, gentamicin. Blood cultures 28th of August 2016 showed no growth, repeated on 02/08/2015 also showed no growth - Blood cultures from 03/06/2015 grew Acinetobacter, per history . - Patient remains on 80% FiO2, PEEP decreased to 10 -Still febrile, had a temperature of  about103F today in the morning. Status post bronchoscopy today on 02/09/2015 by Dr. Darrol Angel,  large mucous plug was eliminated from left main bronchus. Oxygen saturations have improved , but then worsens to 75% FiO2 today with 5 cm of PEEP. More alert today and is talking. Confused.  Now on ciprofloxacin and clindamycin.   REVIEW OF SYSTEMS:  Review of Systems  Unable to perform ROS: critical illness    DRUG ALLERGIES:   Allergies  Allergen Reactions  . Sulfa Antibiotics Anaphylaxis  . Zosyn [Piperacillin-Tazobactam In Dex] Anaphylaxis  . Ciprofloxacin Other (See Comments)    HYPOTENSION  . Lorazepam Other (See Comments)    Per pt's father. Reaction unknown. Diazepam and clonazepam OK  . Vancomycin   . Penicillins Rash    VITALS:  Blood pressure 121/72, pulse 39, temperature 102.2 F (39 C), temperature source Oral, resp. rate 31, height  (1.651 m), weight 73.029 kg (161 lb), SpO2 100 %.  PHYSICAL EXAMINATION:  Physical Exam  GENERAL:  24 y.o.-year-old patient lying in the bed, bedbound, contracted, in  mild respiratory distress.  Tachycardic,  Tachypneic, open his eyes and converses confused, more alert EYES: Pupils equal, round, reactive to light and accommodation. No scleral icterus.  HEENT: Head  atraumatic, normocephalic. Oropharynx and nasopharynx clear.  NECK:  Supple, no jugular venous distention. No thyroid enlargement, no tenderness.  Trach in place. LUNGS: Coarse breath sounds bilaterally with rhonchi. Scattered bilateral wheezing, few rales at bases , no crepitation. Using accessory muscles of respiration. Better air entrance overall.  CARDIOVASCULAR: S1, S2 normal. No murmurs, rubs, or gallops.  ABDOMEN: Soft, nontender, nondistended. Bowel sounds present. No organomegaly or mass. PEG tube in place EXTREMITIES: 2+ pedal edema, No cyanosis, or clubbing. Atrophic extremities NEUROLOGIC: Eye contact. Nodding head sometimes. Not following commands. No facial droop, alert and interactive at baseline per family. Quadriplegic and bed bound. PSYCHIATRIC: Patient is alert.  SKIN: No obvious rash, lesion, or ulcer. Right dorsum hand and forearm skin red blotch Somnolent today. Not able to interact  LABORATORY PANEL:   CBC  Recent Labs Lab 03/15/15 0435  WBC 11.3*  HGB 9.8*  HCT 28.4*  PLT 280   ------------------------------------------------------------------------------------------------------------------  Chemistries   Recent Labs Lab 03/15/15 0435  NA 128*  K 4.1  CL 92*  CO2 28  GLUCOSE 157*  BUN 7  CREATININE <0.30*  CALCIUM 8.6*   ------------------------------------------------------------------------------------------------------------------  Cardiac Enzymes No results for input(s): TROPONINI in the last 168 hours. ------------------------------------------------------------------------------------------------------------------  RADIOLOGY:  Dg Chest 1 View  03/14/2015   CLINICAL DATA:  Respiratory failure.  Bacterial infection.  EXAM: CHEST  1 VIEW  COMPARISON:  03/13/2015 and prior radiographs  FINDINGS: A tracheostomy tube and right PICC line with tip overlying the lower SVC again noted.  Right lung airspace disease/ atelectasis has slightly improved.  Improved left  lower lung aeration also noted.  There is no evidence of pneumothorax.  No other interval changes identified.  IMPRESSION: Improved right lung airspace disease/ atelectasis and left lower lung aeration. No other significant change.   Electronically Signed   By: Harmon Pier M.D.   On: 03/14/2015 09:43    EKG:   Orders placed or performed during the hospital encounter of 01/14/15  . EKG 12-Lead  . EKG 12-Lead  . 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  1. Sepsis due to Acinetobacter- - positive blood cultures- Acinetobacter- from 03/06/15 - repeat blood cultures from 03/07/15 and from the  first of September 2016 so far are negative - bronch and lavage and cultures growing Pseudomonas , now on the ciprofloxacin IV, repeated bronchoscopy, bronchoalveolar lavage, also shows moderate growth of  pseudomonas, also some other gram-negative flora ID to follow , patient continues to spike high fevers, despite addition of clindamycin - ID consult appreciated - Due to allergic reaction to meropenem, started on Cipro with Benadryl -Continues to spike fevers, could be due to atelectasis, now status post bronchoscopy on 03/12/2015 by Dr. Darrol Angel during which large amount of secretions were suctioned from left main bronchus. Chest x-ray today third of September 2016 revealed improvement of aeration left lung clearing of left upper lobe abnormalities. However, increased atelectasis in the right lung was noted.  Added Robitussin to   Facilitate sputum removal - will need 2 weeks ABX - UA with no infection -As Blood cultures remain negative, PICC line may need to be placed. Getting CT scan of the chest to rule out pulmonary embolism as well as lower extremity  Dopplers to rule out DVT as workup for fevers of unknown etiology  2. Acute on chronic hypoxic respiratory failure--secondary to pneumonia,? Collapse left lung on admission - s/p chronic trach, now on 50% % fio2, PEEP decreased to 5  September  third - appreciate pulmonary consult - s/p bronch 03/08/15 done, CXR with improvement of left side followed by that and followed by Repeat bronch again on 03/09/2015, right sided mucus plug and consolidation, most recent bronchoscopy second of September 2016 by Dr. Darrol Angel mucous plug from left main bronchus was removed , now on Mucomyst inhalations, white blood count cell count overall is improving daily, following intermittently - WBC remains elevated , but stable - cont vent support.  Adding Robitussin. Getting CT scan of the chest with IV contrast to rule out pulmonary embolism since patient's FiO2 went up to 75% again  3. TBI and h/o seizure disorder- on keppra and depakote-doses adjusted as recent outpatient levels were high. Discussed with Dr. Malvin Johns in the past Also on baclofen for muscle spasms. Stable. No intervention. Patient is confused, likely delirious getting sitter involved  4. DM- some better blood glucose control with advanced Lantus    To 45 units, but not good enough. We will be advancing Lantus even higher and continue as needed novolog On tube feeds, had to be  held  few days ago due to high residual, however, restarted again  5. Anxiety-cont home meds  6. DVT prophylaxis- lovenox, getting Dopplers to rule out deep vein thrombosis  7. High fevers of unclear etiology. Blood cultures are negative so far, sputum cultures are growing Pseudomonas which is  covered with ciprofloxacin. Anaerobic cultures are pending. However, patient is receiving clindamycin. Get a CTA of chest to rule out PE as well as Doppler ultrasound to rule out DVT as possible source of patient's fevers 8.  Questionable dysuria  Versus voiding difficulty,  Get postvoid  though bladder scan and place Foley catheter is if needed.   All the records are reviewed and case discussed with Care Management/Social Workerr. Management plans discussed with the patient, family and they are in agreement.  CODE STATUS: FULL CODE Discussed is Dr. Belia Heman treatment plan as well as evaluation  TOTAL CRITICAL CARE TIME SPENT IN TAKING CARE OF THIS PATIENT: .   Katharina Caper M.D on 03/15/2015 at 1:40 PM  Between 7am to 6pm - Pager - [phone]  After 6pm go to [email or web address] - password [email or web address] [facility]  [facility]  Office  [phone]  CC: Primary care physician; Dorothey Baseman, MD

## 2015-03-16 LAB — CBC
HEMATOCRIT: 27.5 % — AB (ref 40.0–52.0)
HEMOGLOBIN: 9.3 g/dL — AB (ref 13.0–18.0)
MCH: 31 pg (ref 26.0–34.0)
MCHC: 33.8 g/dL (ref 32.0–36.0)
MCV: 91.6 fL (ref 80.0–100.0)
PLATELETS: 291 10*3/uL (ref 150–440)
RBC: 3 MIL/uL — AB (ref 4.40–5.90)
RDW: 13.9 % (ref 11.5–14.5)
WBC: 11.7 10*3/uL — AB (ref 3.8–10.6)

## 2015-03-16 LAB — CULTURE, BLOOD (SINGLE): Culture: NO GROWTH

## 2015-03-16 LAB — BASIC METABOLIC PANEL
ANION GAP: 7 (ref 5–15)
BUN: 8 mg/dL (ref 6–20)
CHLORIDE: 92 mmol/L — AB (ref 101–111)
CO2: 28 mmol/L (ref 22–32)
Calcium: 8.3 mg/dL — ABNORMAL LOW (ref 8.9–10.3)
Creatinine, Ser: <0.3 mg/dL — ABNORMAL LOW (ref 0.61–1.24)
Glucose, Bld: 115 mg/dL — ABNORMAL HIGH (ref 65–99)
POTASSIUM: 3.8 mmol/L (ref 3.5–5.1)
SODIUM: 127 mmol/L — AB (ref 135–145)

## 2015-03-16 LAB — GLUCOSE, CAPILLARY
GLUCOSE-CAPILLARY: 313 mg/dL — AB (ref 65–99)
Glucose-Capillary: 116 mg/dL — ABNORMAL HIGH (ref 65–99)
Glucose-Capillary: 228 mg/dL — ABNORMAL HIGH (ref 65–99)
Glucose-Capillary: 258 mg/dL — ABNORMAL HIGH (ref 65–99)
Glucose-Capillary: 281 mg/dL — ABNORMAL HIGH (ref 65–99)
Glucose-Capillary: 306 mg/dL — ABNORMAL HIGH (ref 65–99)

## 2015-03-16 LAB — CULTURE, BLOOD (ROUTINE X 2)
CULTURE: NO GROWTH
Culture: NO GROWTH
Special Requests: NORMAL

## 2015-03-16 LAB — ANAEROBIC CULTURE: SPECIAL REQUESTS: NORMAL

## 2015-03-16 MED ORDER — HALOPERIDOL LACTATE 5 MG/ML IJ SOLN
5.0000 mg | Freq: Once | INTRAMUSCULAR | Status: AC
  Administered 2015-03-16: 5 mg via INTRAVENOUS

## 2015-03-16 MED ORDER — LEVOFLOXACIN IN D5W 750 MG/150ML IV SOLN
750.0000 mg | INTRAVENOUS | Status: DC
  Administered 2015-03-16 – 2015-03-18 (×3): 750 mg via INTRAVENOUS
  Filled 2015-03-16 (×4): qty 150

## 2015-03-16 MED ORDER — HALOPERIDOL LACTATE 5 MG/ML IJ SOLN
INTRAMUSCULAR | Status: AC
  Administered 2015-03-16: 5 mg via INTRAVENOUS
  Filled 2015-03-16: qty 1

## 2015-03-16 MED ORDER — INSULIN ASPART 100 UNIT/ML ~~LOC~~ SOLN: 4.0000 [IU] | Freq: Four times a day (QID) | SUBCUTANEOUS | Status: DC | PRN

## 2015-03-16 MED ORDER — INSULIN ASPART 100 UNIT/ML ~~LOC~~ SOLN
4.0000 [IU] | Freq: Four times a day (QID) | SUBCUTANEOUS | Status: DC
  Administered 2015-03-16 (×2): 4 [IU] via SUBCUTANEOUS
  Administered 2015-03-17: 100 [IU] via SUBCUTANEOUS
  Administered 2015-03-17 – 2015-03-18 (×5): 4 [IU] via SUBCUTANEOUS
  Filled 2015-03-16 (×9): qty 4

## 2015-03-16 MED ORDER — POTASSIUM CHLORIDE IN NACL 20-0.9 MEQ/L-% IV SOLN
INTRAVENOUS | Status: DC
  Administered 2015-03-16 – 2015-03-17 (×2): via INTRAVENOUS
  Filled 2015-03-16 (×6): qty 1000

## 2015-03-16 NOTE — Progress Notes (Signed)
Speech Therapy Note: reviewed chart notes; consulted MD re: pt's status. Pt remains on vent support via trach w/ PEG TFs at this time. Pt has a baseline of aspiration and high risk for aspiration. Pt may benefit from continuing w/ PEG TFs for an extended period of time until medical and respiratory status' are improved. MD to reconsult ST services when appropriate. Rec. Oral care daily.

## 2015-03-16 NOTE — Progress Notes (Signed)
Spoke with Dr. Winona Legato about prn insulin order. MD clarified order, that order is for coverage while on cyclic tube feedings per recommendations from Diabetes Coordinator.

## 2015-03-16 NOTE — Progress Notes (Signed)
Nutrition Follow-up    INTERVENTION:   EN: pt tolerating 16 hours feedings of Vital 1.5 at 75 ml/hr, free water flush of 25 mL q 8 hours. Total free water 987 mL. Discussed current sodium level during ICU rounds, MD started NS infusion. Plan to place condom cath to better assess UOP. No new weight since 8/28, will request new weight   NUTRITION DIAGNOSIS:   Increased nutrient needs related to acute illness as evidenced by estimated needs. Being addressed via TF   GOAL:   Patient will meet greater than or equal to 90% of their needs  MONITOR:    (Energy intake, Digestive system, Electrolyte and renal profile)  REASON FOR ASSESSMENT:   Other (Comment) (trach, PEG)    ASSESSMENT:    Pt remains on vent via trach  EN: tolerated change in formula and rate, Vital 1.5 TF at 75 ml/hr for 16 hours  Digestive System: no signs of TF intolerance, residuals 65 mL  Electrolyte and Renal Profile:  Recent Labs Lab 03/14/15 0533 03/15/15 0435 03/16/15 0358  BUN CREATININE <0.30* <0.30* <0.30*  NA 130* 128* 127*  K 4.2 4.1 3.8   Glucose Profile:  Recent Labs  03/16/15 0349 03/16/15 0740 03/16/15 1143  GLUCAP 116* 228* 306*   Nutritional Anemia Profile:  CBC Latest Ref Rng 03/16/2015 03/15/2015 03/14/2015  WBC 3.8 - 10.6 K/uL 11.7(H) 11.3(H) 9.8  Hemoglobin 13.0 - 18.0 g/dL 1.6(X) 0.9(U) 0.4(V)  Hematocrit 40.0 - 52.0 % 27.5(L) 28.4(L) 28.0(L)  Platelets 150 - 440 K/uL 291 280 261    Meds: NS at 100 ml/hr ordered this AM, ss novolog, novolog, lantus, MVI, prn bowel regimen  Urine Volume: no UOP documented, pt wearing diapers  Height:   Ht Readings from Last 1 Encounters:  03/07/15  (1.651 m)    Weight:   Wt Readings from Last 1 Encounters:  03/07/15 161 lb (73.029 kg)     BMI:  Body mass index is 26.79 kg/(m^2).  Estimated Nutritional Needs:   Kcal:  TEE 1931 kcals/d (Ve 9.4, Tmax 37.5)   Protein:  (1.2-2.0 g/kg) 88-146 g/d  Fluid:   (30-42ml/kg) 4098-1191 ml/d  EDUCATION NEEDS:   No education needs identified at this time  HIGH Care Level  Romelle Starcher MS, RD, LDN [phone] Pager

## 2015-03-16 NOTE — Evaluation (Addendum)
Speech Language Pathology Evaluation Patient Details Name: Roger Tate MRN: [id number] DOB: 1991/03/10 Today's Date: 03/16/2015 Time: 1600-1700 SLP Time Calculation (min) (ACUTE ONLY): 60 min  Problem List:  Patient Active Problem List   Diagnosis Date Noted  . Lung collapse   . Bacteremia 03/07/2015  . Acute on chronic respiratory failure 01/14/2015  . Left lower lobe pneumonia 01/14/2015  . Hypotension 01/14/2015  . Sepsis 01/14/2015  . S/P ventriculoperitoneal shunt 01/14/2015  . H/O tracheostomy 01/14/2015  . S/P percutaneous endoscopic gastrostomy (PEG) tube placement 01/14/2015  . Scoliosis 01/14/2015  . 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 Brain injury 2002  . Diabetes mellitus without complication   . Seizures   . Asthma    Past Surgical History:  Past Surgical History  Procedure Laterality Date  . Brain surgery  2002  . Tracheostomy tube placement    . Sp perc place gastric tube    . Csf shunt     HPI:      Assessment / Plan / Recommendation Clinical Impression  Pt appears to be able to tolerate brief use/wear of PMV for verbal communication w/out significant decline in respiratory status or O2 desaturation. Pt did appear to fatigue w/ continued wear of the PMV after ~15 mins. He stated he wanted the PMV removed and wanted to "go to bed now". Pt appears at min. increased risk for poor toleration of PMV at this time d/t the moderate tracheal secretions but suspect w/ strict supervision and monitoring as well as tracheal suctioning prior to use, pt may be able to tolerate short periods of use/wear. Rec. PMV be removed if any discomfort or desaturation is noted; if pt exhibits increased tracheal secretions. Pt MUST only wear the PMV w/ NSG supervision at this time. Father consulted on above and agreed. NSG present.      SLP Assessment  Patient needs continued Speech Lanaguage Pathology Services     Follow Up Recommendations   (TBD)    Frequency and Duration min 3x week  1 week   Pertinent Vitals/Pain Pain Assessment: No/denies pain   SLP Goals  Patient/Family Stated Goal: to talk Potential to Achieve Goals (ACUTE ONLY): Fair Potential Considerations (ACUTE ONLY): Ability to learn/carryover information;Co-morbidities;Family/community support  SLP Evaluation Prior Functioning   pt resides at home w/ total care for ADLs. Father stated pt is able to use the PMV at home and has even "slept" w/ it on. Father indicated that pt does wear it "all day" sometimes. He stated pt enjoys talking.    Cognition  Orientation Level: Oriented to person;Disoriented to situation;Disoriented to place;Other (comment) (knows month but not year or day)    Comprehension   baseline TBI w/ Cognitive impairment   Expression  see above  Oral / Motor Motor Speech Intelligibility: Intelligible (fair) Decreased breath support for speech - 1-2 word responses noted.   GO      [name], MS, CCC-SLP  Roger Tate 03/16/2015, 5:39 PM

## 2015-03-16 NOTE — Progress Notes (Signed)
Inpatient Diabetes Program Recommendations  AACE/ADA: New Consensus Statement on Inpatient Glycemic Control (2013)  Target Ranges:  Prepandial:   less than 140 mg/dL      Peak postprandial:   less than 180 mg/dL (1-2 hours)      Critically ill patients:  140 - 180 mg/dL    Results for BELAL, SCALLON (MRN [id number]) as of 03/16/2015 10:05  Ref. Range 03/15/2015 00:19 03/15/2015 04:21 03/15/2015 08:05 03/15/2015 11:03 03/15/2015 16:00 03/15/2015 20:24  Glucose-Capillary Latest Ref Range: 65-99 mg/dL 409 (H) 811 (H) 914 (H) 255 (H) 278 (H) 300 (H)    Results for DOVBER, ERNEST (MRN [id number]) as of 03/16/2015 10:05  Ref. Range 03/16/2015 00:09 03/16/2015 03:49 03/16/2015 07:40  Glucose-Capillary Latest Ref Range: 65-99 mg/dL 086 (H) 578 (H) 469 (H)    Current DM Orders: Lantus 45 units daily            Novolog Sensitive SSI (0-9 units) Q4 hours     -Note patient receiving Vital tube feeds at 75 cc/hour between the hours of 6am- 10pm.  -Glucose levels elevated while patient receiving tube feeds.  -Note Lantus increased to 45 units daily yesterday (09/05).    MD- Please consider the following in-hospital insulin adjustments:  Instead of further increasing Lantus today, please consider adding Novolog Tube Feed coverage during the hours patient is receiving continuous tube feeds.  Recommend Novolog 4 units set dose to be given at the following times: 8am, 12pm, 4pm, 8pm.  Hold this dose of Novolog if tube feeds held for any reason.     Will follow Ambrose Finland RN, MSN, CDE Diabetes Coordinator Inpatient Glycemic Control Team Team Pager: [phone] (8a-5p)

## 2015-03-16 NOTE — Progress Notes (Signed)
RASS 0, more alert and awake this AM,  No overt distress Remains vent dependent on high FiO2, PEEP weaned down to 50%.  Recurrent L and R lung collapse Patient placed on PS mode and he felt SOB   Doing better this am, appears more comfotable.still with febrile episodes  Filed Vitals:   03/16/15 0500 03/16/15 0600 03/16/15 0700 03/16/15 0800  BP: 105/60 126/86 110/60 115/74  Pulse: 102 112 105 102  Temp: 101.6 F (38.7 C)   101.7 F (38.7 C)  TempSrc: Axillary   Oral  Resp: Height:      Weight:      SpO2: 96% 100% 98% 98%   NAD HEENT WNL Trach site clean Absent BS on L, rhonchi on R, moderate mucopurulent secretions Tachy, reg, no M Abd soft, G-tube site clean Contractures, no edema  BMP Latest Ref Rng 03/16/2015 03/15/2015 03/14/2015  Glucose 65 - 99 mg/dL 782(N) 562(Z) 308(M)  BUN 6 - 20 mg/dL Creatinine 0.61 - 1.24 mg/dL <5.78(I) <6.96(E) <9.52(W)  Sodium 135 - 145 mmol/L 127(L) 128(L) 130(L)  Potassium 3.5 - 5.1 mmol/L 3.8 4.1 4.2  Chloride 101 - 111 mmol/L 92(L) 92(L) 94(L)  CO2 22 - 32 mmol/L Calcium 8.9 - 10.3 mg/dL 8.3(L) 8.6(L) 8.6(L)    CBC Latest Ref Rng 03/16/2015 03/15/2015 03/14/2015  WBC 3.8 - 10.6 K/uL 11.7(H) 11.3(H) 9.8  Hemoglobin 13.0 - 18.0 g/dL 4.1(L) 2.4(M) 0.1(U)  Hematocrit 40.0 - 52.0 % 27.5(L) 28.4(L) 28.0(L)  Platelets 150 - 440 K/uL 291 280 261    CXR: resolving R/L lung collapse  IMPRESSION: 24 yo white male with chronic vent dep resp failure with acute b/l pneumonia with failure to wean from vent With h/o TBI  Remote TBI Chronic trach and chronic nocturnal ventilation Chronic G-tube Acinetobacter and pseudomonas PNA and bacteremia Severe, recurrent mucus plugging Recurrent left and right lung collapse due to mucus plugging. Bilateral lung aeration has improved today. Oxygen saturation has improved on a lower FiO2  FOB 8/29, 9/02  VDRF - still on high  FiO2 requirements reactive sinus tachycardia,  improved  PLAN/REC: Cont full vent support - settings reviewed and/or adjusted-wean fio2 and to PS as tolerated Cont vent bundle Wean down FiO2 as tolerated.  Cont chest percussion, bed and manual, and frequent trach suctioning Discontinued nebulized steroid, continue  albuterol, mucomyst, CPT.  Monitor I/Os cont TFs Abx per ID Sampson Goon) -decrease h2o flushes in setting of hyponatremia    I have personally obtained a history, examined the patient, evaluated Pertinent laboratory and RadioGraphic/imaging results, and  formulated the assessment and plan   The Patient requires high complexity decision making for assessment and support, frequent evaluation and titration of therapies. Time Spent with patient 35 mins   Kim Oki Santiago Glad, M.D.  [facility]  Medical Director [facility] Medical Director [facility] Cardio-Pulmonary Department

## 2015-03-16 NOTE — Progress Notes (Signed)
[facility] - [place] at [facility]   PATIENT NAME: Roger Tate    MR#:  [id number]  DATE OF BIRTH:  03-25-1991  SUBJECTIVE:  CHIEF COMPLAINT:   Chief Complaint  Patient presents with  . 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 Admitted for aspiration pneumonitis. Bronchoalveolar lavage revealed light growth of Pseudomonas aeruginosa resistant to imipenem, gentamicin. Blood cultures 28th of August 2016 showed no growth, repeated on 02/08/2015 also showed no growth - Blood cultures from 03/06/2015 grew Acinetobacter, per history . - Patient remains on 80% FiO2, PEEP decreased to 10 -Continues fevers despite antibiotic therapy. CT scan of the chest with IV contrast to rule out pulmonary embolism, as well as lower extremity Dopplers to rule out DVT were negative. CT scan reveals pneumonia. BAL cultures reveal moderate growth of Pseudomonas aeruginosa as well as stenotrophomonas maltophilia, patient is on ciprofloxacin and clindamycin at present. According to family members, patient has intermittent fevers at home, which were thought to be related to his brain injury. Sodium level is low at 127.   REVIEW OF SYSTEMS:  Review of Systems  Unable to perform ROS: critical illness    DRUG ALLERGIES:   Allergies  Allergen Reactions  . Sulfa Antibiotics Anaphylaxis  . Zosyn [Piperacillin-Tazobactam In Dex] Anaphylaxis  . Ciprofloxacin Other (See Comments)    HYPOTENSION  . Lorazepam Other (See Comments)    Per pt's father. Reaction unknown. Diazepam and clonazepam OK  . Vancomycin   . Penicillins Rash    VITALS:  Blood pressure 130/82, pulse 114, temperature 100.9 F (38.3 C), temperature source Oral, resp. rate 31, height 5\' 5"  (1.651 m), weight 73.029 kg (161 lb), SpO2 99 %.  PHYSICAL EXAMINATION:  Physical Exam  GENERAL:  24 y.o.-year-old patient lying in the bed, bedbound, contracted, in  mild respiratory distress.  Tachycardic,   Tachypneic, open his eyes and converses confused, more alert EYES: Pupils equal, round, reactive to light and accommodation. No scleral icterus.  HEENT: Head atraumatic, normocephalic. Oropharynx and nasopharynx clear.  NECK:  Supple, no jugular venous distention. No thyroid enlargement, no tenderness.  Trach in place. LUNGS: Coarse breath sounds bilaterally with rhonchi. Scattered bilateral wheezing, few rales at bases , no crepitation. Using accessory muscles of respiration. Better air entrance overall.  CARDIOVASCULAR: S1, S2 normal. No murmurs, rubs, or gallops.  ABDOMEN: Soft, nontender, nondistended. Bowel sounds present. No organomegaly or mass. PEG tube in place EXTREMITIES: 2+ pedal edema, No cyanosis, or clubbing. Atrophic extremities NEUROLOGIC: Eye contact. Nodding head sometimes. Not following commands. No facial droop, alert and interactive at baseline per family. Quadriplegic and bed bound. PSYCHIATRIC: Patient is alert.  SKIN: No obvious rash, lesion, or ulcer. Right dorsum hand and forearm skin red blotch Somnolent today. Not able to interact  LABORATORY PANEL:   CBC  Recent Labs Lab 03/16/15 0358  WBC 11.7*  HGB 9.3*  HCT 27.5*  PLT 291   ------------------------------------------------------------------------------------------------------------------  Chemistries   Recent Labs Lab 03/16/15 0358  NA 127*  K 3.8  CL 92*  CO2 28  GLUCOSE 115*  BUN 8  CREATININE <0.30*  CALCIUM 8.3*   ------------------------------------------------------------------------------------------------------------------  Cardiac Enzymes No results for input(s): TROPONINI in the last 168 hours. ------------------------------------------------------------------------------------------------------------------  RADIOLOGY:  Ct Angio Chest Pe W/cm &/or Wo Cm  03/15/2015   CLINICAL DATA:  Respiratory distress. Dilution dependent. Chronic atelectasis. Concern for pulmonary embolism.   EXAM: CT ANGIOGRAPHY CHEST WITH CONTRAST  TECHNIQUE: Multidetector CT imaging  of the chest was performed using the standard protocol during bolus administration of intravenous contrast. Multiplanar CT image reconstructions and MIPs were obtained to evaluate the vascular anatomy.  CONTRAST:  OMNIPAQUE IOHEXOL 350 MG/ML SOLN  COMPARISON:  Radiograph 03/14/2015  FINDINGS: Mediastinum/Nodes: Exam is degraded by ventilator dependent respiratory motion. No filling defects within the proximal pulmonary arteries. The lower lobe pulmonary arteries are poorly evaluated. No upper lobe filling defect identified. No acute findings aorta great vessels. No pericardial fluid.  Lungs/Pleura: There is dense consolidation in the RIGHT lower lobe. Patchy consolidation in the RIGHT middle lobe. These findings are consistent multi lobar pneumonia. Patchy consolidation in the RIGHT upper lobe additionally. LEFT lung is relatively clear.  Upper abdomen: Limited view of the liver, kidneys, pancreas are unremarkable. Normal adrenal glands.  Musculoskeletal: No aggressive osseous lesion.  Review of the MIP images confirms the above findings.  IMPRESSION: 1. No gross evidence of pulmonary embolism in suboptimal exam secondary to ventilator dependent respiratory motion. 2. Multifocal pneumonia within the RIGHT upper lobe, RIGHT middle lobe and RIGHT lower lobe with dense consolidation in the RIGHT lower lobe.   Electronically Signed   By: Genevive Bi M.D.   On: 03/15/2015 14:05   US Venous Img Lower Bilateral  03/15/2015   CLINICAL DATA:  Fever, quadriplegic patient bed found.  EXAM: BILATERAL LOWER EXTREMITY VENOUS DOPPLER ULTRASOUND  TECHNIQUE: Gray-scale sonography with graded compression, as well as color Doppler and duplex ultrasound were performed to evaluate the lower extremity deep venous systems from the level of the common femoral vein and including the common femoral, femoral, profunda femoral, popliteal and calf veins  including the posterior tibial, peroneal and gastrocnemius veins when visible. The superficial great saphenous vein was also interrogated. Spectral Doppler was utilized to evaluate flow at rest and with distal augmentation maneuvers in the common femoral, femoral and popliteal veins.  COMPARISON:  None.  FINDINGS: RIGHT LOWER EXTREMITY  Common Femoral Vein: No evidence of thrombus. Normal compressibility, respiratory phasicity and response to augmentation.  Saphenofemoral Junction: No evidence of thrombus. Normal compressibility and flow on color Doppler imaging.  Profunda Femoral Vein: No evidence of thrombus. Normal compressibility and flow on color Doppler imaging.  Femoral Vein: No evidence of thrombus. Normal compressibility, respiratory phasicity and response to augmentation.  Popliteal Vein: No evidence of thrombus. Normal compressibility, respiratory phasicity and response to augmentation.  Calf Veins: No evidence of thrombus. Normal compressibility and flow on color Doppler imaging.  Superficial Great Saphenous Vein: No evidence of thrombus. Normal compressibility and flow on color Doppler imaging.  LEFT LOWER EXTREMITY  Common Femoral Vein: No evidence of thrombus. Normal compressibility, respiratory phasicity and response to augmentation.  Saphenofemoral Junction: No evidence of thrombus. Normal compressibility and flow on color Doppler imaging.  Profunda Femoral Vein: No evidence of thrombus. Normal compressibility and flow on color Doppler imaging.  Femoral Vein: No evidence of thrombus. Normal compressibility, respiratory phasicity and response to augmentation.  Popliteal Vein: No evidence of thrombus. Normal compressibility, respiratory phasicity and response to augmentation.  Calf Veins: No evidence of thrombus. Normal compressibility and flow on color Doppler imaging.  Superficial Great Saphenous Vein: No evidence of thrombus. Normal compressibility and flow on color Doppler imaging.  IMPRESSION: No  evidence of deep venous thrombosis.   Electronically Signed   By: Genevive Bi M.D.   On: 03/15/2015 22:22    EKG:   Orders placed or performed during the hospital encounter of 01/14/15  . EKG 12-Lead  .  EKG 12-Lead  . 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  1. Sepsis due to Acinetobacter- - positive blood cultures- Acinetobacter- from 03/06/15 - repeat blood cultures from 03/07/15 and from the  first of September 2016 so far are negative - bronch and lavage and cultures growing Pseudomonas and stenotrophomonas , now on the ciprofloxacin IV, clindamycin - ID consult appreciated - Due to allergic reaction to meropenem, started on Cipro with Benadryl -Continues to spike fevers, possibly related to brain injury - will need 2 weeks ABX - UA with no infection -As Blood cultures remain negative, PICC line should be placed.   2. Acute on chronic hypoxic respiratory failure--secondary to pneumonia,? Collapse left lung on admission - s/p chronic trach, now on 45% % fio2, PEEP decreased to 5  centimeters - appreciate pulmonary consult - s/p bronch 03/08/15 done, CXR with improvement of left side followed by that and followed by Repeat bronch again on 03/09/2015, right sided mucus plug and consolidation, most recent bronchoscopy second of September 2016 by Dr. Darrol Angel mucous plug from left main bronchus was removed , now on Mucomyst inhalations, Robitussin .- WBC remains elevated , but stable - cont vent support.    3. TBI and h/o seizure disorder- on keppra and depakote-doses adjusted as recent outpatient levels were high. Discussed with Dr. Malvin Johns in the past Also on baclofen for muscle spasms. Stable. No intervention. Patient was confused, likely  delirious, given haldol, better with Haldol as well as sitter at the bedside  4. DM- better blood glucose control with advanced Lantus  to 45 units, adding pre-meal novolog for diabetes educator recommendation. On tube feeds, had to be  held  few days ago due to high residual, however, restarted again  5. Anxiety-cont home meds  6. DVT prophylaxis- lovenox, getting Dopplers to rule out deep vein thrombosis  7. High fevers of unclear etiology. Blood cultures are negative so far, likely noninfectious and related to brain injury, as per family 8.  Questionable dysuria  Versus voiding difficulty,  Get postvoid though bladder scan and place Foley catheter is if needed.   All the records are reviewed and case discussed with Care Management/Social Workerr. Management plans discussed with the patient, family and they are in agreement.  CODE STATUS: FULL CODE Discussed is Dr. Belia Heman treatment plan as well as evaluation  TOTAL CRITICAL CARE TIME SPENT IN TAKING CARE OF THIS PATIENT: .   Katharina Caper M.D on 03/16/2015 at 11:18 AM  Between 7am to 6pm - Pager - [phone]  After 6pm go to [email or web address] - password [email or web address]  [facility] [place] [facility]  Office  [phone]  CC: Primary care physician; Dorothey Baseman, MD

## 2015-03-16 NOTE — Progress Notes (Signed)
[facility] CLINIC INFECTIOUS DISEASE PROGRESS NOTE Date of Admission:  03/07/2015     ID: Roger Tate is a 24 y.o. male with acinetobacter bacteremia  Active Problems:   Bacteremia   Lung collapse  Subjective: He remains on vent still febirle CT with multifocal pna Seems to be tolerating ciprofloxacin, clinda WBC done some  ROS  Eleven systems are reviewed and negative except per hpi  Medications:  Antibiotics Given (last 72 hours)    Date/Time Action Medication Dose Rate   03/13/15 1557 Given   clindamycin (CLEOCIN) IVPB 600 mg 600 mg 100 mL/hr   03/13/15 2107 Given   clindamycin (CLEOCIN) IVPB 600 mg 600 mg 100 mL/hr   03/13/15 2107 Given   ciprofloxacin (CIPRO) IVPB 400 mg 400 mg 200 mL/hr   03/14/15 0420 Given   ciprofloxacin (CIPRO) IVPB 400 mg 400 mg 200 mL/hr   03/14/15 0530 Given   clindamycin (CLEOCIN) IVPB 600 mg 600 mg 100 mL/hr   03/14/15 1309 Given   ciprofloxacin (CIPRO) IVPB 400 mg 400 mg 200 mL/hr   03/14/15 1528 Given   clindamycin (CLEOCIN) IVPB 600 mg 600 mg 100 mL/hr   03/14/15 2031 Given   ciprofloxacin (CIPRO) IVPB 400 mg 400 mg 200 mL/hr   03/14/15 2102 Given   clindamycin (CLEOCIN) IVPB 600 mg 600 mg 100 mL/hr   03/15/15 0425 Given   ciprofloxacin (CIPRO) IVPB 400 mg 400 mg 200 mL/hr   03/15/15 1308 Given   clindamycin (CLEOCIN) IVPB 600 mg 600 mg 100 mL/hr   03/15/15 1346 Given   ciprofloxacin (CIPRO) IVPB 400 mg 400 mg 200 mL/hr   03/15/15 1548 Given   clindamycin (CLEOCIN) IVPB 600 mg 600 mg 100 mL/hr   03/15/15 2155 Given   ciprofloxacin (CIPRO) IVPB 400 mg 400 mg 200 mL/hr   03/15/15 2328 Given   clindamycin (CLEOCIN) IVPB 600 mg 600 mg 100 mL/hr   03/16/15 0538 Given   ciprofloxacin (CIPRO) IVPB 400 mg 400 mg 200 mL/hr   03/16/15 6578 Given   clindamycin (CLEOCIN) IVPB 600 mg 600 mg 100 mL/hr   03/16/15 1135 Given   levofloxacin (LEVAQUIN) IVPB 750 mg 750 mg 100 mL/hr   03/16/15 1408 Given   clindamycin (CLEOCIN) IVPB 600 mg 600 mg  100 mL/hr     . antiseptic oral rinse  7 mL Mouth Rinse QID  . baclofen  10 mg Per Tube BID  . calcium carbonate  1 tablet Oral Daily  . chlorhexidine gluconate  15 mL Mouth Rinse BID  . clindamycin (CLEOCIN) IV  600 mg Intravenous 3 times per day  . clotrimazole   Topical BID  . diphenhydrAMINE  25 mg Intravenous Q12H  . enoxaparin (LOVENOX) injection  40 mg Subcutaneous Q24H  . feeding supplement (PRO-STAT SUGAR FREE 64)  30 mL Oral Daily  . fentaNYL (SUBLIMAZE) injection  50 mcg Intravenous Once  . free water  25 mL Per Tube 3 times per day  . guaiFENesin  200 mg Oral Q4H  . insulin aspart  0-9 Units Subcutaneous 6 times per day  . insulin glargine  45 Units Subcutaneous Daily  . ipratropium-albuterol  3 mL Nebulization Q6H  . levETIRAcetam  1,500 mg Per Tube BID  . levofloxacin (LEVAQUIN) IV  750 mg Intravenous Q24H  . loratadine  10 mg Oral Daily  . multivitamin with minerals   Oral Daily  . nystatin cream  1 application Topical TID  . ranitidine  150 mg Per Tube BID  . Valproic  Acid  500 mg Per Tube TID  . vitamin C  250 mg Oral Daily    Objective: Vital signs in last 24 hours: Temp:  [99.7 F (37.6 C)-101.7 F (38.7 C)] 100.9 F (38.3 C) (09/06 0945) Pulse Rate:  [100-123] 108 (09/06 1400) Resp:  [18-32] 21 (09/06 1400) BP: (96-142)/(54-91) 127/76 mmHg (09/06 1400) SpO2:  [77 %-100 %] 95 % (09/06 1400) FiO2 (%):  [30 %-60 %] 40 % (09/06 1355) GENERAL: 24 y.o.-year-old patient lying in the bed, bedbound, contracted, with no acute distress. Gets anxious when trying to examine EYES: Pupils equal, round, reactive to light and accommodation. No scleral icterus.  HEENT: Head atraumatic, normocephalic. Oropharynx and nasopharynx clear.  NECK: Supple, no jugular venous distention. No thyroid enlargement, no tenderness.  Trach in place. LUNGS: Coarse breath sounds bilaterally with rhonchi. no wheezing, rales, or crepitation. No use of accessory muscles of respiration.   CARDIOVASCULAR: S1, S2 normal. No murmurs, rubs, or gallops.  PICC RUE ABDOMEN: Soft, nontender, nondistended. Bowel sounds present. No organomegaly or mass. PEG tube in place EXTREMITIES: 2+ pedal edema, No cyanosis, or clubbing. Atrophic extremities NEUROLOGIC: Eye contact. Nodding head sometimes. Not following commands. No facial droop, alert and interactive at baseline per family. Quadriplegic and bed bound. PSYCHIATRIC: Patient is alert.  SKIN: No obvious rash, lesion, or ulcer. Right dorsum hand and forearm skin red blotch  Lab Results  Recent Labs  03/15/15 0435 03/16/15 0358  WBC 11.3* 11.7*  HGB 9.8* 9.3*  HCT 28.4* 27.5*  NA 128* 127*  K 4.1 3.8  CL 92* 92*  CO2 28 28  BUN 7 8  CREATININE <0.30* <0.30*    Microbiology: Results for orders placed or performed during the hospital encounter of 03/07/15  Blood culture (routine x 2)     Status: None   Collection Time: 03/07/15  3:00 PM  Result Value Ref Range Status   Specimen Description BLOOD LEFT ASSIST CONTROL  Final   Special Requests   Final    BOTTLES DRAWN AEROBIC AND ANAEROBIC  AER 5CC ANA 2CC   Culture NO GROWTH 5 DAYS  Final   Report Status 03/12/2015 FINAL  Final  Blood culture (routine x 2)     Status: None   Collection Time: 03/07/15  4:42 PM  Result Value Ref Range Status   Specimen Description BLOOD LEFT ARM  Final   Special Requests BOTTLES DRAWN AEROBIC AND ANAEROBIC  4CC  Final   Culture NO GROWTH 5 DAYS  Final   Report Status 03/12/2015 FINAL  Final  Urine culture     Status: None   Collection Time: 03/07/15  6:13 PM  Result Value Ref Range Status   Specimen Description URINE, CLEAN CATCH  Final   Special Requests NONE  Final   Culture NO GROWTH  Final   Report Status 03/08/2015 FINAL  Final  MRSA PCR Screening     Status: None   Collection Time: 03/07/15  7:43 PM  Result Value Ref Range Status   MRSA by PCR NEGATIVE NEGATIVE Final    Comment:        The GeneXpert MRSA Assay  (FDA approved for NASAL specimens only), is one component of a comprehensive MRSA colonization surveillance program. It is not intended to diagnose MRSA infection nor to guide or monitor treatment for MRSA infections.   Culture, bal-quantitative     Status: None   Collection Time: 03/08/15  2:50 PM  Result Value Ref Range Status   Specimen  Description BRONCHIAL ALVEOLAR LAVAGE  Final   Special Requests NONE  Final   Gram Stain   Final    GOOD SPECIMEN - 80-90% WBCS MODERATE WBC SEEN RARE GRAM POSITIVE COCCI IN PAIRS RARE GRAM NEGATIVE RODS    Culture LIGHT GROWTH PSEUDOMONAS AERUGINOSA  Final   Report Status 03/11/2015 FINAL  Final   Organism ID, Bacteria PSEUDOMONAS AERUGINOSA  Final      Susceptibility   Pseudomonas aeruginosa - MIC*    CEFTAZIDIME Value in next row Sensitive      SENSITIVE2    CIPROFLOXACIN Value in next row Sensitive      SENSITIVE0.5    GENTAMICIN Value in next row Resistant      RESISTANT>=16    IMIPENEM Value in next row Resistant      RESISTANT>=16    PIP/TAZO Value in next row Sensitive      SENSITIVE<=4    CEFEPIME Value in next row Sensitive      SENSITIVE8    * LIGHT GROWTH PSEUDOMONAS AERUGINOSA  Culture, blood (routine x 2)     Status: None   Collection Time: 03/11/15  1:43 PM  Result Value Ref Range Status   Specimen Description Blood  Final   Special Requests Normal  Final   Culture NO GROWTH 5 DAYS  Final   Report Status 03/16/2015 FINAL  Final  Culture, blood (routine x 2)     Status: None   Collection Time: 03/11/15  1:50 PM  Result Value Ref Range Status   Specimen Description BLOOD LEFT HAND  Final   Special Requests   Final    BOTTLES DRAWN AEROBIC AND ANAEROBIC 5CC AERP 3 CC ANAERO   Culture NO GROWTH 5 DAYS  Final   Report Status 03/16/2015 FINAL  Final  Anaerobic culture     Status: None   Collection Time: 03/11/15  5:00 PM  Result Value Ref Range Status   Specimen Description FLUID  Final   Special Requests Normal   Final   Culture NO ANAEROBES ISOLATED  Final   Report Status 03/16/2015 FINAL  Final  Culture, blood (single)     Status: None   Collection Time: 03/11/15  5:00 PM  Result Value Ref Range Status   Specimen Description BLOOD RIGHT ASSIST CONTROL  Final   Special Requests BOTTLES DRAWN AEROBIC AND ANAEROBIC  Final   Culture NO GROWTH 5 DAYS  Final   Report Status 03/16/2015 FINAL  Final  Culture, bal-quantitative     Status: None   Collection Time: 03/12/15 11:53 AM  Result Value Ref Range Status   Specimen Description BRONCHIAL ALVEOLAR LAVAGE  Final   Special Requests NONE  Final   Gram Stain   Final    MANY WBC SEEN MANY GRAM NEGATIVE RODS EXCELLENT SPECIMEN - 90-100% WBCS    Culture   Final    MODERATE GROWTH PSEUDOMONAS AERUGINOSA MODERATE GROWTH STENOTROPHOMONAS MALTOPHILIA NORMAL FLORA ABSENT    Report Status 03/15/2015 FINAL  Final   Organism ID, Bacteria STENOTROPHOMONAS MALTOPHILIA  Final   Organism ID, Bacteria PSEUDOMONAS AERUGINOSA  Final      Susceptibility   Pseudomonas aeruginosa - MIC*    CEFTAZIDIME Value in next row Sensitive      SENSITIVE4    CIPROFLOXACIN Value in next row Sensitive      SENSITIVE1    GENTAMICIN Value in next row Resistant      RESISTANT>=16    IMIPENEM Value in next row Resistant  RESISTANT>=16    PIP/TAZO Value in next row Sensitive      SENSITIVE8    CEFEPIME Value in next row Sensitive      SENSITIVE8    * MODERATE GROWTH PSEUDOMONAS AERUGINOSA   Stenotrophomonas maltophilia - MIC*    LEVOFLOXACIN Value in next row Sensitive      SENSITIVE1    TRIMETH/SULFA Value in next row Sensitive      SENSITIVE<=20    * MODERATE GROWTH STENOTROPHOMONAS MALTOPHILIA    Studies/Results: Ct Angio Chest Pe W/cm &/or Wo Cm  03/15/2015   CLINICAL DATA:  Respiratory distress. Dilution dependent. Chronic atelectasis. Concern for pulmonary embolism.  EXAM: CT ANGIOGRAPHY CHEST WITH CONTRAST  TECHNIQUE: Multidetector CT imaging of the  chest was performed using the standard protocol during bolus administration of intravenous contrast. Multiplanar CT image reconstructions and MIPs were obtained to evaluate the vascular anatomy.  CONTRAST:  OMNIPAQUE IOHEXOL 350 MG/ML SOLN  COMPARISON:  Radiograph 03/14/2015  FINDINGS: Mediastinum/Nodes: Exam is degraded by ventilator dependent respiratory motion. No filling defects within the proximal pulmonary arteries. The lower lobe pulmonary arteries are poorly evaluated. No upper lobe filling defect identified. No acute findings aorta great vessels. No pericardial fluid.  Lungs/Pleura: There is dense consolidation in the RIGHT lower lobe. Patchy consolidation in the RIGHT middle lobe. These findings are consistent multi lobar pneumonia. Patchy consolidation in the RIGHT upper lobe additionally. LEFT lung is relatively clear.  Upper abdomen: Limited view of the liver, kidneys, pancreas are unremarkable. Normal adrenal glands.  Musculoskeletal: No aggressive osseous lesion.  Review of the MIP images confirms the above findings.  IMPRESSION: 1. No gross evidence of pulmonary embolism in suboptimal exam secondary to ventilator dependent respiratory motion. 2. Multifocal pneumonia within the RIGHT upper lobe, RIGHT middle lobe and RIGHT lower lobe with dense consolidation in the RIGHT lower lobe.   Electronically Signed   By: Genevive Bi M.D.   On: 03/15/2015 14:05   US Venous Img Lower Bilateral  03/15/2015   CLINICAL DATA:  Fever, quadriplegic patient bed found.  EXAM: BILATERAL LOWER EXTREMITY VENOUS DOPPLER ULTRASOUND  TECHNIQUE: Gray-scale sonography with graded compression, as well as color Doppler and duplex ultrasound were performed to evaluate the lower extremity deep venous systems from the level of the common femoral vein and including the common femoral, femoral, profunda femoral, popliteal and calf veins including the posterior tibial, peroneal and gastrocnemius veins when visible. The  superficial great saphenous vein was also interrogated. Spectral Doppler was utilized to evaluate flow at rest and with distal augmentation maneuvers in the common femoral, femoral and popliteal veins.  COMPARISON:  None.  FINDINGS: RIGHT LOWER EXTREMITY  Common Femoral Vein: No evidence of thrombus. Normal compressibility, respiratory phasicity and response to augmentation.  Saphenofemoral Junction: No evidence of thrombus. Normal compressibility and flow on color Doppler imaging.  Profunda Femoral Vein: No evidence of thrombus. Normal compressibility and flow on color Doppler imaging.  Femoral Vein: No evidence of thrombus. Normal compressibility, respiratory phasicity and response to augmentation.  Popliteal Vein: No evidence of thrombus. Normal compressibility, respiratory phasicity and response to augmentation.  Calf Veins: No evidence of thrombus. Normal compressibility and flow on color Doppler imaging.  Superficial Great Saphenous Vein: No evidence of thrombus. Normal compressibility and flow on color Doppler imaging.  LEFT LOWER EXTREMITY  Common Femoral Vein: No evidence of thrombus. Normal compressibility, respiratory phasicity and response to augmentation.  Saphenofemoral Junction: No evidence of thrombus. Normal compressibility and flow on color Doppler  imaging.  Profunda Femoral Vein: No evidence of thrombus. Normal compressibility and flow on color Doppler imaging.  Femoral Vein: No evidence of thrombus. Normal compressibility, respiratory phasicity and response to augmentation.  Popliteal Vein: No evidence of thrombus. Normal compressibility, respiratory phasicity and response to augmentation.  Calf Veins: No evidence of thrombus. Normal compressibility and flow on color Doppler imaging.  Superficial Great Saphenous Vein: No evidence of thrombus. Normal compressibility and flow on color Doppler imaging.  IMPRESSION: No evidence of deep venous thrombosis.   Electronically Signed   By: Genevive Bi  M.D.   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 Was treated with ceftazidime but changed to meropenem once sensis back on acinetobacter. However he developed a mild rash so meropenem stopped and placed on  cipro IV.  Has had recurrent issues with mucus plugging (the R side on 8/30) and has had 2 bronchs.  Cultures from bronch showing Pseudomonas (sensitive to cipro) and acinetobacter He continues to have fevers, abundant tracheal secretions and xray findings. Given repeat fever he had bcx x 2  9/1 and these are negative to date I suspect he is aspirating given the high TF residual and CXR fidings.   Recommendations Continue IV quinolone - changed to levo at  pseudomonal dosing 750 q 24   - continue  IV benadryl 30 min prior to dosing  Continue clindamycin for a 7 day course.   He will need a 21 day course for the pseudomonas - stop date 9/18. However I do not think all of this needs to be IV. IF he stabilizes can be changed to oral. He can be switched to oral clinda at dc 300 q 8 until 9/10  Thank you very much for the consult. Will follow with you.  Bernardo Brayman   03/16/2015, 2:55 PM

## 2015-03-16 NOTE — Progress Notes (Signed)
Pt was becoming increasingly agitated and anxious throughout shift, after multiple attempts to calm pt MD was informed.  Pt was having frequent desats due to continuous talking and anxiousness.  RT informed of desats and adjusted pt vent settings accordingly. MD ordered Haldol  IV push one time, after which pt became calm and slept comfortably. VVS at this time.

## 2015-03-17 LAB — BASIC METABOLIC PANEL
ANION GAP: 6 (ref 5–15)
BUN: 8 mg/dL (ref 6–20)
CALCIUM: 8.7 mg/dL — AB (ref 8.9–10.3)
CO2: 26 mmol/L (ref 22–32)
Chloride: 93 mmol/L — ABNORMAL LOW (ref 101–111)
GLUCOSE: 142 mg/dL — AB (ref 65–99)
Potassium: 4.7 mmol/L (ref 3.5–5.1)
Sodium: 125 mmol/L — ABNORMAL LOW (ref 135–145)

## 2015-03-17 LAB — CBC
HCT: 30.8 % — ABNORMAL LOW (ref 40.0–52.0)
HEMOGLOBIN: 10.6 g/dL — AB (ref 13.0–18.0)
MCH: 31.4 pg (ref 26.0–34.0)
MCHC: 34.5 g/dL (ref 32.0–36.0)
MCV: 90.8 fL (ref 80.0–100.0)
PLATELETS: 319 10*3/uL (ref 150–440)
RBC: 3.39 MIL/uL — AB (ref 4.40–5.90)
RDW: 13.9 % (ref 11.5–14.5)
WBC: 13.5 10*3/uL — ABNORMAL HIGH (ref 3.8–10.6)

## 2015-03-17 LAB — GLUCOSE, CAPILLARY
GLUCOSE-CAPILLARY: 124 mg/dL — AB (ref 65–99)
GLUCOSE-CAPILLARY: 251 mg/dL — AB (ref 65–99)
GLUCOSE-CAPILLARY: 259 mg/dL — AB (ref 65–99)
Glucose-Capillary: 172 mg/dL — ABNORMAL HIGH (ref 65–99)
Glucose-Capillary: 249 mg/dL — ABNORMAL HIGH (ref 65–99)

## 2015-03-17 LAB — OSMOLALITY: OSMOLALITY: 260 mosm/kg — AB (ref 275–300)

## 2015-03-17 LAB — SODIUM
Sodium: 122 mmol/L — ABNORMAL LOW (ref 135–145)
Sodium: 121 mmol/L — ABNORMAL LOW (ref 135–145)

## 2015-03-17 LAB — OSMOLALITY, URINE: OSMOLALITY UR: 381 mosm/kg — AB (ref 390–1090)

## 2015-03-17 LAB — SODIUM, URINE, RANDOM: SODIUM UR: 118 mmol/L

## 2015-03-17 MED ORDER — TOLVAPTAN 15 MG PO TABS
15.0000 mg | ORAL_TABLET | ORAL | Status: DC
  Administered 2015-03-17: 15 mg via ORAL
  Filled 2015-03-17: qty 1

## 2015-03-17 MED ORDER — FUROSEMIDE 10 MG/ML IJ SOLN
40.0000 mg | Freq: Once | INTRAMUSCULAR | Status: AC
  Administered 2015-03-17: 40 mg via INTRAVENOUS
  Filled 2015-03-17: qty 4

## 2015-03-17 MED ORDER — TOBRAMYCIN 300 MG/5ML IN NEBU
300.0000 mg | INHALATION_SOLUTION | Freq: Two times a day (BID) | RESPIRATORY_TRACT | Status: DC
Start: 2015-03-17 — End: 2015-03-18
  Administered 2015-03-17 – 2015-03-18 (×2): 300 mg via RESPIRATORY_TRACT
  Filled 2015-03-17 (×4): qty 5

## 2015-03-17 MED ORDER — VECURONIUM BROMIDE 10 MG IV SOLR
INTRAVENOUS | Status: AC
  Filled 2015-03-17: qty 10

## 2015-03-17 NOTE — Plan of Care (Signed)
Problem: Phase I Progression Outcomes Goal: Patient tolerating weaning plan Outcome: Progressing Seems to have days and nights turned around.  Too tired to stay on trach collar earlier.  Pt now more wakeful and father will be returning this evening. We will place on trach collar then.  Problem: Phase II Progression Outcomes Goal: Hemodynamically stable Outcome: Progressing Vss with exception of elevated temp.  Father states elevated temp is normal for him Goal: Progress activities as ordered Outcome: Progressing Pt is bed bound quadrapalegic. He has been turned q 2-3 hrs. Bed has been rotated and percussed.  He has been logrolled for numerous diaper changes and bath Goal: Tolerating prescribed nutrition plan Outcome: Progressing Tolerating TF.  TF off at night 10p-6am. Goal: Discharge/transfer plan updated Outcome: Progressing Plans to be discharged home tomorrow on levaquin per peg and tobramycin via nebs. He has home vent, percussion vest and home health services. Father is supportive and knowledgeable regarding care.

## 2015-03-17 NOTE — Progress Notes (Signed)
[facility] INFECTIOUS DISEASE PROGRESS NOTE Date of Admission:  03/07/2015     ID: Roger Tate is a 24 y.o. 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 CT with multifocal pna Seems to be tolerating ciprofloxacin, clinda  ROS  Eleven systems are reviewed and negative except per hpi  Medications:  Antibiotics Given (last 72 hours)    Date/Time Action Medication Dose Rate   03/14/15 1528 Given   clindamycin (CLEOCIN) IVPB 600 mg 600 mg 100 mL/hr   03/14/15 2031 Given   ciprofloxacin (CIPRO) IVPB 400 mg 400 mg 200 mL/hr   03/14/15 2102 Given   clindamycin (CLEOCIN) IVPB 600 mg 600 mg 100 mL/hr   03/15/15 0425 Given   ciprofloxacin (CIPRO) IVPB 400 mg 400 mg 200 mL/hr   03/15/15 1610 Given   clindamycin (CLEOCIN) IVPB 600 mg 600 mg 100 mL/hr   03/15/15 1346 Given   ciprofloxacin (CIPRO) IVPB 400 mg 400 mg 200 mL/hr   03/15/15 1548 Given   clindamycin (CLEOCIN) IVPB 600 mg 600 mg 100 mL/hr   03/15/15 2155 Given   ciprofloxacin (CIPRO) IVPB 400 mg 400 mg 200 mL/hr   03/15/15 2328 Given   clindamycin (CLEOCIN) IVPB 600 mg 600 mg 100 mL/hr   03/16/15 0538 Given   ciprofloxacin (CIPRO) IVPB 400 mg 400 mg 200 mL/hr   03/16/15 9604 Given   clindamycin (CLEOCIN) IVPB 600 mg 600 mg 100 mL/hr   03/16/15 1135 Given   levofloxacin (LEVAQUIN) IVPB 750 mg 750 mg 100 mL/hr   03/16/15 1408 Given   clindamycin (CLEOCIN) IVPB 600 mg 600 mg 100 mL/hr   03/16/15 2201 Given   clindamycin (CLEOCIN) IVPB 600 mg 600 mg 100 mL/hr   03/17/15 5409 Given   clindamycin (CLEOCIN) IVPB 600 mg 600 mg 100 mL/hr   03/17/15 1054 Given   levofloxacin (LEVAQUIN) IVPB 750 mg 750 mg 100 mL/hr     . antiseptic oral rinse  7 mL Mouth Rinse QID  . baclofen  10 mg Per Tube BID  . calcium carbonate  1 tablet Oral Daily  . chlorhexidine gluconate  15 mL Mouth Rinse BID  .  clindamycin (CLEOCIN) IV  600 mg Intravenous 3 times per day  . clotrimazole   Topical BID  . enoxaparin (LOVENOX) injection  40 mg Subcutaneous Q24H  . feeding supplement (PRO-STAT SUGAR FREE 64)  30 mL Oral Daily  . fentaNYL (SUBLIMAZE) injection  50 mcg Intravenous Once  . free water  25 mL Per Tube 3 times per day  . guaiFENesin  200 mg Oral Q4H  . insulin aspart  0-9 Units Subcutaneous 6 times per day  . insulin aspart  4 Units Subcutaneous QID  . insulin glargine  45 Units Subcutaneous Daily  . ipratropium-albuterol  3 mL Nebulization Q6H  . levETIRAcetam  1,500 mg Per Tube BID  . levofloxacin (LEVAQUIN) IV  750 mg Intravenous Q24H  . loratadine  10 mg Oral Daily  . multivitamin with minerals   Oral Daily  . nystatin cream  1 application Topical TID  . ranitidine  150 mg Per Tube BID  . tobramycin (PF)  300 mg Nebulization BID  . Valproic Acid  500 mg Per Tube TID  . vecuronium      . vitamin C  250 mg Oral Daily    Objective: Vital signs in last 24 hours:  Temp:  [100.8 F (38.2 C)-101.2 F (38.4 C)] 101 F (38.3 C) (09/07 1200) Pulse Rate:  [102-123] 114 (09/07 1200) Resp:  [21-32] 29 (09/07 1200) BP: (108-140)/(61-96) 121/73 mmHg (09/07 1200) SpO2:  [81 %-100 %] 98 % (09/07 1200) FiO2 (%):  [40 %-70 %] 40 % (09/07 1200) Weight:  [70.8 kg (156 lb 1.4 oz)] 70.8 kg (156 lb 1.4 oz) (09/07 0500) GENERAL: 24 y.o.-year-old patient lying in the bed, bedbound, contracted, with no acute distress. Gets anxious when trying to examine EYES: Pupils equal, round, reactive to light and accommodation. No scleral icterus.  HEENT: Head atraumatic, normocephalic. Oropharynx and nasopharynx clear.  NECK: Supple, no jugular venous distention. No thyroid enlargement, no tenderness.  Trach in place. LUNGS: Coarse breath sounds bilaterally with rhonchi. no wheezing, rales, or crepitation. No use of accessory muscles of respiration.  CARDIOVASCULAR: S1, S2 normal. No murmurs, rubs, or  gallops.  PICC RUE ABDOMEN: Soft, nontender, nondistended. Bowel sounds present. No organomegaly or mass. PEG tube in place EXTREMITIES: 2+ pedal edema, No cyanosis, or clubbing. Atrophic extremities NEUROLOGIC: Eye contact. Nodding head sometimes. Not following commands. No facial droop, alert and interactive at baseline per family. Quadriplegic and bed bound. PSYCHIATRIC: Patient is alert.  SKIN: No obvious rash, lesion, or ulcer. Right dorsum hand and forearm skin red blotch  Lab Results  Recent Labs  03/16/15 0358 03/17/15 0443 03/17/15 1225  WBC 11.7* 13.5*  --   HGB 9.3* 10.6*  --   HCT 27.5* 30.8*  --   NA 127* 125* 122*  K 3.8 4.7  --   CL 92* 93*  --   CO2 28 26  --   BUN 8 8  --   CREATININE <0.30* <0.30*  --     Microbiology: Results for orders placed or performed during the hospital encounter of 03/07/15  Blood culture (routine x 2)     Status: None   Collection Time: 03/07/15  3:00 PM  Result Value Ref Range Status   Specimen Description BLOOD LEFT ASSIST CONTROL  Final   Special Requests   Final    BOTTLES DRAWN AEROBIC AND ANAEROBIC  AER 5CC ANA 2CC   Culture NO GROWTH 5 DAYS  Final   Report Status 03/12/2015 FINAL  Final  Blood culture (routine x 2)     Status: None   Collection Time: 03/07/15  4:42 PM  Result Value Ref Range Status   Specimen Description BLOOD LEFT ARM  Final   Special Requests BOTTLES DRAWN AEROBIC AND ANAEROBIC  4CC  Final   Culture NO GROWTH 5 DAYS  Final   Report Status 03/12/2015 FINAL  Final  Urine culture     Status: None   Collection Time: 03/07/15  6:13 PM  Result Value Ref Range Status   Specimen Description URINE, CLEAN CATCH  Final   Special Requests NONE  Final   Culture NO GROWTH  Final   Report Status 03/08/2015 FINAL  Final  MRSA PCR Screening     Status: None   Collection Time: 03/07/15  7:43 PM  Result Value Ref Range Status   MRSA by PCR NEGATIVE NEGATIVE Final    Comment:        The GeneXpert MRSA Assay  (FDA approved for NASAL specimens only), is one component of a comprehensive MRSA colonization surveillance program. It is not intended to diagnose MRSA infection nor to guide or monitor treatment for MRSA infections.   Culture, bal-quantitative     Status: None  Collection Time: 03/08/15  2:50 PM  Result Value Ref Range Status   Specimen Description BRONCHIAL ALVEOLAR LAVAGE  Final   Special Requests NONE  Final   Gram Stain   Final    GOOD SPECIMEN - 80-90% WBCS MODERATE WBC SEEN RARE GRAM POSITIVE COCCI IN PAIRS RARE GRAM NEGATIVE RODS    Culture LIGHT GROWTH PSEUDOMONAS AERUGINOSA  Final   Report Status 03/11/2015 FINAL  Final   Organism ID, Bacteria PSEUDOMONAS AERUGINOSA  Final      Susceptibility   Pseudomonas aeruginosa - MIC*    CEFTAZIDIME Value in next row Sensitive      SENSITIVE2    CIPROFLOXACIN Value in next row Sensitive      SENSITIVE0.5    GENTAMICIN Value in next row Resistant      RESISTANT>=16    IMIPENEM Value in next row Resistant      RESISTANT>=16    PIP/TAZO Value in next row Sensitive      SENSITIVE<=4    CEFEPIME Value in next row Sensitive      SENSITIVE8    * LIGHT GROWTH PSEUDOMONAS AERUGINOSA  Culture, blood (routine x 2)     Status: None   Collection Time: 03/11/15  1:43 PM  Result Value Ref Range Status   Specimen Description Blood  Final   Special Requests Normal  Final   Culture NO GROWTH 5 DAYS  Final   Report Status 03/16/2015 FINAL  Final  Culture, blood (routine x 2)     Status: None   Collection Time: 03/11/15  1:50 PM  Result Value Ref Range Status   Specimen Description BLOOD LEFT HAND  Final   Special Requests   Final    BOTTLES DRAWN AEROBIC AND ANAEROBIC 5CC AERP 3 CC ANAERO   Culture NO GROWTH 5 DAYS  Final   Report Status 03/16/2015 FINAL  Final  Anaerobic culture     Status: None   Collection Time: 03/11/15  5:00 PM  Result Value Ref Range Status   Specimen Description FLUID  Final   Special Requests Normal   Final   Culture NO ANAEROBES ISOLATED  Final   Report Status 03/16/2015 FINAL  Final  Culture, blood (single)     Status: None   Collection Time: 03/11/15  5:00 PM  Result Value Ref Range Status   Specimen Description BLOOD RIGHT ASSIST CONTROL  Final   Special Requests BOTTLES DRAWN AEROBIC AND ANAEROBIC  Final   Culture NO GROWTH 5 DAYS  Final   Report Status 03/16/2015 FINAL  Final  Culture, bal-quantitative     Status: None   Collection Time: 03/12/15 11:53 AM  Result Value Ref Range Status   Specimen Description BRONCHIAL ALVEOLAR LAVAGE  Final   Special Requests NONE  Final   Gram Stain   Final    MANY WBC SEEN MANY GRAM NEGATIVE RODS EXCELLENT SPECIMEN - 90-100% WBCS    Culture   Final    MODERATE GROWTH PSEUDOMONAS AERUGINOSA MODERATE GROWTH STENOTROPHOMONAS MALTOPHILIA NORMAL FLORA ABSENT    Report Status 03/15/2015 FINAL  Final   Organism ID, Bacteria STENOTROPHOMONAS MALTOPHILIA  Final   Organism ID, Bacteria PSEUDOMONAS AERUGINOSA  Final      Susceptibility   Pseudomonas aeruginosa - MIC*    CEFTAZIDIME Value in next row Sensitive      SENSITIVE4    CIPROFLOXACIN Value in next row Sensitive      SENSITIVE1    GENTAMICIN Value in next row Resistant  RESISTANT>=16    IMIPENEM Value in next row Resistant      RESISTANT>=16    PIP/TAZO Value in next row Sensitive      SENSITIVE8    CEFEPIME Value in next row Sensitive      SENSITIVE8    * MODERATE GROWTH PSEUDOMONAS AERUGINOSA   Stenotrophomonas maltophilia - MIC*    LEVOFLOXACIN Value in next row Sensitive      SENSITIVE1    TRIMETH/SULFA Value in next row Sensitive      SENSITIVE<=20    * MODERATE GROWTH STENOTROPHOMONAS MALTOPHILIA    Studies/Results: Ct Angio Chest Pe W/cm &/or Wo Cm  03/15/2015   CLINICAL DATA:  Respiratory distress. Dilution dependent. Chronic atelectasis. Concern for pulmonary embolism.  EXAM: CT ANGIOGRAPHY CHEST WITH CONTRAST  TECHNIQUE: Multidetector CT imaging of the  chest was performed using the standard protocol during bolus administration of intravenous contrast. Multiplanar CT image reconstructions and MIPs were obtained to evaluate the vascular anatomy.  CONTRAST:  OMNIPAQUE IOHEXOL 350 MG/ML SOLN  COMPARISON:  Radiograph 03/14/2015  FINDINGS: Mediastinum/Nodes: Exam is degraded by ventilator dependent respiratory motion. No filling defects within the proximal pulmonary arteries. The lower lobe pulmonary arteries are poorly evaluated. No upper lobe filling defect identified. No acute findings aorta great vessels. No pericardial fluid.  Lungs/Pleura: There is dense consolidation in the RIGHT lower lobe. Patchy consolidation in the RIGHT middle lobe. These findings are consistent multi lobar pneumonia. Patchy consolidation in the RIGHT upper lobe additionally. LEFT lung is relatively clear.  Upper abdomen: Limited view of the liver, kidneys, pancreas are unremarkable. Normal adrenal glands.  Musculoskeletal: No aggressive osseous lesion.  Review of the MIP images confirms the above findings.  IMPRESSION: 1. No gross evidence of pulmonary embolism in suboptimal exam secondary to ventilator dependent respiratory motion. 2. Multifocal pneumonia within the RIGHT upper lobe, RIGHT middle lobe and RIGHT lower lobe with dense consolidation in the RIGHT lower lobe.   Electronically Signed   By: Genevive Bi M.D.   On: 03/15/2015 14:05   US Venous Img Lower Bilateral  03/15/2015   CLINICAL DATA:  Fever, quadriplegic patient bed found.  EXAM: BILATERAL LOWER EXTREMITY VENOUS DOPPLER ULTRASOUND  TECHNIQUE: Gray-scale sonography with graded compression, as well as color Doppler and duplex ultrasound were performed to evaluate the lower extremity deep venous systems from the level of the common femoral vein and including the common femoral, femoral, profunda femoral, popliteal and calf veins including the posterior tibial, peroneal and gastrocnemius veins when visible. The  superficial great saphenous vein was also interrogated. Spectral Doppler was utilized to evaluate flow at rest and with distal augmentation maneuvers in the common femoral, femoral and popliteal veins.  COMPARISON:  None.  FINDINGS: RIGHT LOWER EXTREMITY  Common Femoral Vein: No evidence of thrombus. Normal compressibility, respiratory phasicity and response to augmentation.  Saphenofemoral Junction: No evidence of thrombus. Normal compressibility and flow on color Doppler imaging.  Profunda Femoral Vein: No evidence of thrombus. Normal compressibility and flow on color Doppler imaging.  Femoral Vein: No evidence of thrombus. Normal compressibility, respiratory phasicity and response to augmentation.  Popliteal Vein: No evidence of thrombus. Normal compressibility, respiratory phasicity and response to augmentation.  Calf Veins: No evidence of thrombus. Normal compressibility and flow on color Doppler imaging.  Superficial Great Saphenous Vein: No evidence of thrombus. Normal compressibility and flow on color Doppler imaging.  LEFT LOWER EXTREMITY  Common Femoral Vein: No evidence of thrombus. Normal compressibility, respiratory phasicity and response to  augmentation.  Saphenofemoral Junction: No evidence of thrombus. Normal compressibility and flow on color Doppler imaging.  Profunda Femoral Vein: No evidence of thrombus. Normal compressibility and flow on color Doppler imaging.  Femoral Vein: No evidence of thrombus. Normal compressibility, respiratory phasicity and response to augmentation.  Popliteal Vein: No evidence of thrombus. Normal compressibility, respiratory phasicity and response to augmentation.  Calf Veins: No evidence of thrombus. Normal compressibility and flow on color Doppler imaging.  Superficial Great Saphenous Vein: No evidence of thrombus. Normal compressibility and flow on color Doppler imaging.  IMPRESSION: No evidence of deep venous thrombosis.   Electronically Signed   By: Genevive Bi  M.D.   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 Was treated with ceftazidime but changed to meropenem once sensis back on acinetobacter. However he developed a mild rash so meropenem stopped and placed on  cipro IV.  Has had recurrent issues with mucus plugging (the R side on 8/30) and has had 2 bronchs.  Cultures from bronch showing Pseudomonas (sensitive to cipro) and acinetobacter He continues to have fevers, abundant tracheal secretions and xray findings. Given repeat fever he had bcx x 2  9/1 and these are negative to date I suspect he is aspirating given the high TF residual and CXR fidings.   Recommendations Continue IV quinolone - changed to levo at  pseudomonal dosing 750 q 24 - can be changed to oral  - stop date 9/18.  Will stop clinda Add inhaled tobramycin 300 bid until 9/18 - I confirmed with lab that the pseudomonas is sensitive to tobra  Thank you very much for the consult. Will follow with you.  Raley Novicki   03/17/2015, 1:46 PM

## 2015-03-17 NOTE — Consult Note (Signed)
[facility]  CONSULT NOTE    Date: 03/17/2015                  Patient Name:  Roger Tate  MRN: [id number]  DOB: 04-05-1991  Age / Sex: 24 y.o., male         PCP: Dorothey Baseman, MD                 Service Requesting Consult: Dr. Belia Heman                 Reason for Consult: Hyponatremia            History of Present Illness: Mr. Roger Tate is a 24 y.o. white male with traumatic brain injury with quadriplegia, tracheostomy on chronic mechanical ventilation, seizure disorder, PEG placement, CSF shunt, diabetes mellitus type II, asthma who was admitted to [facility] on 03/07/2015 for bacteremia and pneumonia.  On admission, patient's serum sodium was 131, close to baseline. However this sodium level has stayed in the 130s during his hospitalization. His hospitalization has been complicated by neurogenic fevers, pneumonia resistant to several antibiotics.  It seems that patient was ordered NS with 20KCl on 9/6. There was concern for patient being hypovolemic. Then his serum sodium started to trend down to 127, then 125 today. Nephrology consulted. There is concern for cerebral salt wasting.  Father at bedside who assists with history taking.   Medications: Outpatient medications: Prescriptions prior to admission  Medication Sig Dispense Refill Last Dose  . ACCU-CHEK SMARTVIEW test strip Test 4 times a day (before each meal and at bedtime).  11 03/06/2015 at Unknown time  . albuterol (PROVENTIL) (2.5 MG/3ML) 0.083% nebulizer solution Inhale 3 mLs into the lungs 4 (four) times daily as needed.   03/06/2015 at Unknown time  . ascorbic acid (VITAMIN C) 250 MG CHEW Chew 250 mg by mouth daily.   03/06/2015 at Unknown time  . baclofen (LIORESAL) 10 MG tablet Take 1 tablet by mouth 2 (two) times daily.   03/06/2015 at Unknown time  . calcium carbonate (TUMS EX) 750 MG chewable tablet Chew 1 tablet by mouth daily.   03/06/2015 at Unknown time  . cetirizine (ZYRTEC) 10 MG tablet Take  10 mg by mouth daily as needed for allergies or rhinitis.    03/06/2015 at Unknown time  . clonazePAM (KLONOPIN) 1 MG tablet Take 1 mg by mouth as needed for anxiety (for 3 or more seizures within an hour.).    prn  . Dextromethorphan-Guaifenesin (GUAIFENESIN DM) 400-20 MG TABS Take 1 tablet by mouth every 12 (twelve) hours.   prn  . diphenhydrAMINE (BENADRYL) 25 mg capsule Take 25 mg by mouth daily as needed (to pretreat antibiotics.).    03/06/2015 at Unknown time  . EPINEPHrine (EPIPEN JR) 0.15 MG/0.3ML injection Inject 0.15 mg into the muscle as needed.   prn  . FIBER PO Take 2 tablets by mouth daily.    03/06/2015 at Unknown time  . glucagon (GLUCAGON EMERGENCY) 1 MG injection Inject 1 mg into the muscle as needed.   prn  . insulin aspart (NOVOLOG) 100 UNIT/ML injection Inject 2 Units into the skin as needed for high blood sugar. Prn before meals for blood glucose greater than 200   prn  . ketoconazole (NIZORAL) 2 % cream Apply 1 application topically 2 (two) times daily as needed for irritation.    prn  . lactulose (CHRONULAC) 10 GM/15ML solution Take 30 mLs by mouth 2 (two) times daily.  03/06/2015 at Unknown time  . levETIRAcetam (KEPPRA) 100 MG/ML solution Place 2,250 mg into feeding tube 2 (two) times daily.  0 03/06/2015 at Unknown time  . [EXPIRED] levofloxacin (LEVAQUIN) 750 MG tablet Take 1 tablet (750 mg total) by mouth daily. 10 tablet 0 03/07/2015 at Unknown time  . Multiple Vitamin (MULTI-VITAMINS PO) Take 2 each by mouth daily. Take gummies.   03/06/2015 at Unknown time  . nystatin cream (MYCOSTATIN) Apply 1 application topically 3 (three) times daily as needed. For rash.  1 prn  . OXYGEN Inhale 1-2 L into the lungs at bedtime.   prn  . PULMICORT 0.5 MG/2ML nebulizer solution Inhale 1 vial into the lungs 2 (two) times daily.  3 03/06/2015 at Unknown time  . ranitidine (ZANTAC) 75 MG/5ML syrup Take 10 mLs by mouth 2 (two) times daily.  4 03/06/2015 at Unknown time  . Valproic Acid  (DEPAKENE) 250 MG/5ML SYRP syrup Take 10-15 mLs by mouth See admin instructions.  Take 10 mls orally twice daily (5am, 3pm) and 15 mls orally nightly at 11pm.  4 03/06/2015 at Unknown time  . bisacodyl (LAXATIVE) 10 MG suppository Place 1 suppository rectally at bedtime as needed for mild constipation, moderate constipation or severe constipation.    prn  . cefUROXime (CEFTIN) 500 MG tablet Take 1 tablet (500 mg total) by mouth 2 (two) times daily with a meal. 10 tablet 0   . levETIRAcetam (KEPPRA) 250 MG tablet Take 2 tablets (500 mg total) by mouth 2 (two) times daily. 60 tablet 0   . linezolid (ZYVOX) 600 MG tablet Take 1 tablet (600 mg total) by mouth 2 (two) times daily. 10 tablet 0     Current medications: Current Facility-Administered Medications  Medication Dose Route Frequency Provider Last Rate Last Dose  . 0.9 % NaCl with KCl 20 mEq/ L  infusion   Intravenous Continuous Katharina Caper, MD 100 mL/hr at 03/17/15 0800    . acetaminophen (TYLENOL) tablet 650 mg  650 mg Oral Q6H PRN Houston Siren, MD   650 mg at 03/16/15 0617   Or  . acetaminophen (TYLENOL) suppository 650 mg  650 mg Rectal Q6H PRN Houston Siren, MD      . antiseptic oral rinse solution (CORINZ)  7 mL Mouth Rinse QID Wyatt Haste, MD   7 mL at 03/17/15 0400  . baclofen (LIORESAL) tablet 10 mg  10 mg Per Tube BID Merwyn Katos, MD   10 mg at 03/17/15 1410  . bisacodyl (DULCOLAX) suppository 10 mg  10 mg Rectal QHS PRN Houston Siren, MD      . calcium carbonate (TUMS - dosed in mg elemental calcium) chewable tablet 200 mg of elemental calcium  1 tablet Oral Daily Houston Siren, MD   200 mg of elemental calcium at 03/17/15 1410  . chlorhexidine gluconate (PERIDEX) 0.12 % solution 15 mL  15 mL Mouth Rinse BID Wyatt Haste, MD   15 mL at 03/17/15 0911  . clonazePAM (KLONOPIN) tablet 1 mg  1 mg Per Tube PRN Merwyn Katos, MD      . clotrimazole (LOTRIMIN) 1 % cream   Topical BID Houston Siren, MD      . diazepam  (VALIUM) injection 5 mg  5 mg Intravenous PRN Merwyn Katos, MD      . diphenhydrAMINE (BENADRYL) capsule 25 mg  25 mg Oral Daily PRN Houston Siren, MD   25 mg at 03/13/15 1242  .  enoxaparin (LOVENOX) injection 40 mg  40 mg Subcutaneous Q24H Houston Siren, MD   40 mg at 03/16/15 1956  . feeding supplement (PRO-STAT SUGAR FREE 64) liquid 30 mL  30 mL Oral Daily Erin Fulling, MD   30 mL at 03/17/15 0921  . feeding supplement (VITAL 1.5 CAL) liquid 1,000 mL  1,000 mL Per Tube Continuous Erin Fulling, MD 75 mL/hr at 03/17/15 0800 1,000 mL at 03/17/15 0800  . fentaNYL (SUBLIMAZE) injection 25 mcg  25 mcg Intravenous Q1H PRN Stephanie Acre, MD   25 mcg at 03/14/15 2058  . fentaNYL (SUBLIMAZE) injection 50 mcg  50 mcg Intravenous Once Merwyn Katos, MD   50 mcg at 03/12/15 1000  . free water 25 mL  25 mL Per Tube 3 times per day Erin Fulling, MD   25 mL at 03/17/15 1400  . guaiFENesin (ROBITUSSIN) 100 MG/5ML solution 200 mg  200 mg Oral Q4H Katharina Caper, MD   200 mg at 03/17/15 1410  . ibuprofen (ADVIL,MOTRIN) 100 MG/5ML suspension 200 mg  200 mg Oral Q6H PRN Katharina Caper, MD   200 mg at 03/16/15 0843  . insulin aspart (novoLOG) injection 0-9 Units  0-9 Units Subcutaneous 6 times per day Merwyn Katos, MD   5 Units at 03/17/15 1245  . insulin aspart (novoLOG) injection 4 Units  4 Units Subcutaneous QID Katharina Caper, MD   4 Units at 03/17/15 1244  . insulin glargine (LANTUS) injection 45 Units  45 Units Subcutaneous Daily Katharina Caper, MD   45 Units at 03/16/15 1044  . ipratropium-albuterol (DUONEB) 0.5-2.5 (3) MG/3ML nebulizer solution 3 mL  3 mL Nebulization Q6H Shane Crutch, MD   3 mL at 03/17/15 1412  . lactulose (CHRONULAC) 10 GM/15ML solution 20 g  20 g Per Tube BID PRN Enid Baas, MD      . levETIRAcetam (KEPPRA) 100 MG/ML solution 1,500 mg  1,500 mg Per Tube BID Enid Baas, MD   1,500 mg at 03/17/15 1410  . levofloxacin (LEVAQUIN) IVPB 750 mg  750 mg Intravenous Q24H  Clydie Braun, MD   750 mg at 03/17/15 1054  . loratadine (CLARITIN) tablet 10 mg  10 mg Oral Daily Houston Siren, MD   10 mg at 03/16/15 2201  . multivitamin with minerals tablet   Oral Daily Houston Siren, MD   1 tablet at 03/17/15 0859  . nystatin cream (MYCOSTATIN) 1 application  1 application Topical TID Houston Siren, MD   1 application at 03/17/15 [phone]  . ondansetron (ZOFRAN) tablet 4 mg  4 mg Oral Q6H PRN Houston Siren, MD       Or  . ondansetron (ZOFRAN) injection 4 mg  4 mg Intravenous Q6H PRN Houston Siren, MD   4 mg at 03/11/15 1037  . ranitidine (ZANTAC) 150 MG/10ML syrup 150 mg  150 mg Per Tube BID Merwyn Katos, MD   150 mg at 03/17/15 1409  . tobramycin (PF) (TOBI) nebulizer solution 300 mg  300 mg Nebulization BID Clydie Braun, MD      . Valproic Acid (DEPAKENE) 250 MG/5ML syrup SYRP 500 mg  500 mg Per Tube TID Enid Baas, MD   500 mg at 03/17/15 0909  . vitamin C (ASCORBIC ACID) tablet 250 mg  250 mg Oral Daily Houston Siren, MD   250 mg at 03/17/15 0859      Allergies: Allergies  Allergen Reactions  . Sulfa Antibiotics Anaphylaxis  . Zosyn [Piperacillin-Tazobactam In  Dex] Anaphylaxis  . Ciprofloxacin Other (See Comments)    HYPOTENSION  . Lorazepam Other (See Comments)    Per pt's father. Reaction unknown. Diazepam and clonazepam OK  . Vancomycin   . Penicillins Rash      Past Medical History: Past Medical History  Diagnosis Date  . Brain injury 2002  . Diabetes mellitus without complication   . Seizures   . Asthma      Past Surgical History: Past Surgical History  Procedure Laterality Date  . Brain surgery  2002  . Tracheostomy tube placement    . Sp perc place gastric tube    . Csf shunt       Family History: Family History  Problem Relation Age of Onset  . Bipolar disorder Mother   . Diabetes Father      Social History: Social History   Social History  . Marital Status: Single    Spouse Name: N/A  . Number  of Children: N/A  . Years of Education: N/A   Occupational History  . Not on file.   Social History Main Topics  . Smoking status: Never Smoker   . Smokeless tobacco: Not on file  . Alcohol Use: No  . Drug Use: No  . Sexual Activity: Not on file   Other Topics Concern  . Not on file   Social History Narrative     Review of Systems: Review of Systems  Unable to perform ROS: acuity of condition    Vital Signs: Blood pressure 120/79, pulse 110, temperature 100.9 F (38.3 C), temperature source Oral, resp. rate 28, height 5\' 5"  (1.651 m), weight 70.8 kg (156 lb 1.4 oz), SpO2 98 %.  Weight trends: Filed Weights   03/07/15 1317 03/17/15 0500  Weight: 73.029 kg (161 lb) 70.8 kg (156 lb 1.4 oz)    Physical Exam: General: Critically ill  Head: Moist oral mucosal membranes  Eyes: Anicteric, PERRL  Neck: tracheostomy  Lungs:  Course breath sounds, PRVC FiO2 40%  Heart: Sinus tachycardia  Abdomen:  Soft, nontender,  +PEG  Extremities: trace peripheral edema.  Neurologic: Does not follow commands.   Skin: No lesions        Lab results: Basic Metabolic Panel:  Recent Labs Lab 03/15/15 0435 03/16/15 0358 03/17/15 0443 03/17/15 1225  NA 128* 127* 125* 122*  K 4.1 3.8 4.7  --   CL 92* 92* 93*  --   CO2 28 28 26   --   GLUCOSE 157* 115* 142*  --   BUN 7 8 8   --   CREATININE <0.30* <0.30* <0.30*  --   CALCIUM 8.6* 8.3* 8.7*  --     Liver Function Tests: No results for input(s): AST, ALT, ALKPHOS, BILITOT, PROT, ALBUMIN in the last 168 hours. No results for input(s): LIPASE, AMYLASE in the last 168 hours. No results for input(s): AMMONIA in the last 168 hours.  CBC:  Recent Labs Lab 03/13/15 0954 03/14/15 0533 03/15/15 0435 03/16/15 0358 03/17/15 0443  WBC 12.5* 9.8 11.3* 11.7* 13.5*  HGB 10.1* 9.7* 9.8* 9.3* 10.6*  HCT 30.0* 28.0* 28.4* 27.5* 30.8*  MCV 92.7 92.3 91.1 91.6 90.8  PLT 260 261 280 291 319    Cardiac Enzymes: No results for input(s):  CKTOTAL, CKMB, CKMBINDEX, TROPONINI in the last 168 hours.  BNP: Invalid input(s): POCBNP  CBG:  Recent Labs Lab 03/16/15 1941 03/17/15 0002 03/17/15 0342 03/17/15 0808 03/17/15 1122  GLUCAP 313* 259* 124* 172* 251*    Microbiology:  Results for orders placed or performed during the hospital encounter of 03/07/15  Blood culture (routine x 2)     Status: None   Collection Time: 03/07/15  3:00 PM  Result Value Ref Range Status   Specimen Description BLOOD LEFT ASSIST CONTROL  Final   Special Requests   Final    BOTTLES DRAWN AEROBIC AND ANAEROBIC  AER 5CC ANA 2CC   Culture NO GROWTH 5 DAYS  Final   Report Status 03/12/2015 FINAL  Final  Blood culture (routine x 2)     Status: None   Collection Time: 03/07/15  4:42 PM  Result Value Ref Range Status   Specimen Description BLOOD LEFT ARM  Final   Special Requests BOTTLES DRAWN AEROBIC AND ANAEROBIC  4CC  Final   Culture NO GROWTH 5 DAYS  Final   Report Status 03/12/2015 FINAL  Final  Urine culture     Status: None   Collection Time: 03/07/15  6:13 PM  Result Value Ref Range Status   Specimen Description URINE, CLEAN CATCH  Final   Special Requests NONE  Final   Culture NO GROWTH  Final   Report Status 03/08/2015 FINAL  Final  MRSA PCR Screening     Status: None   Collection Time: 03/07/15  7:43 PM  Result Value Ref Range Status   MRSA by PCR NEGATIVE NEGATIVE Final    Comment:        The GeneXpert MRSA Assay (FDA approved for NASAL specimens only), is one component of a comprehensive MRSA colonization surveillance program. It is not intended to diagnose MRSA infection nor to guide or monitor treatment for MRSA infections.   Culture, bal-quantitative     Status: None   Collection Time: 03/08/15  2:50 PM  Result Value Ref Range Status   Specimen Description BRONCHIAL ALVEOLAR LAVAGE  Final   Special Requests NONE  Final   Gram Stain   Final    GOOD SPECIMEN - 80-90% WBCS MODERATE WBC SEEN RARE GRAM POSITIVE  COCCI IN PAIRS RARE GRAM NEGATIVE RODS    Culture LIGHT GROWTH PSEUDOMONAS AERUGINOSA  Final   Report Status 03/11/2015 FINAL  Final   Organism ID, Bacteria PSEUDOMONAS AERUGINOSA  Final      Susceptibility   Pseudomonas aeruginosa - MIC*    CEFTAZIDIME Value in next row Sensitive      SENSITIVE2    CIPROFLOXACIN Value in next row Sensitive      SENSITIVE0.5    GENTAMICIN Value in next row Resistant      RESISTANT>=16    IMIPENEM Value in next row Resistant      RESISTANT>=16    PIP/TAZO Value in next row Sensitive      SENSITIVE<=4    CEFEPIME Value in next row Sensitive      SENSITIVE8    * LIGHT GROWTH PSEUDOMONAS AERUGINOSA  Culture, blood (routine x 2)     Status: None   Collection Time: 03/11/15  1:43 PM  Result Value Ref Range Status   Specimen Description Blood  Final   Special Requests Normal  Final   Culture NO GROWTH 5 DAYS  Final   Report Status 03/16/2015 FINAL  Final  Culture, blood (routine x 2)     Status: None   Collection Time: 03/11/15  1:50 PM  Result Value Ref Range Status   Specimen Description BLOOD LEFT HAND  Final   Special Requests   Final    BOTTLES DRAWN AEROBIC AND ANAEROBIC 5CC AERP 3  CC ANAERO   Culture NO GROWTH 5 DAYS  Final   Report Status 03/16/2015 FINAL  Final  Anaerobic culture     Status: None   Collection Time: 03/11/15  5:00 PM  Result Value Ref Range Status   Specimen Description FLUID  Final   Special Requests Normal  Final   Culture NO ANAEROBES ISOLATED  Final   Report Status 03/16/2015 FINAL  Final  Culture, blood (single)     Status: None   Collection Time: 03/11/15  5:00 PM  Result Value Ref Range Status   Specimen Description BLOOD RIGHT ASSIST CONTROL  Final   Special Requests BOTTLES DRAWN AEROBIC AND ANAEROBIC  Final   Culture NO GROWTH 5 DAYS  Final   Report Status 03/16/2015 FINAL  Final  Culture, bal-quantitative     Status: None   Collection Time: 03/12/15 11:53 AM  Result Value Ref Range Status    Specimen Description BRONCHIAL ALVEOLAR LAVAGE  Final   Special Requests NONE  Final   Gram Stain   Final    MANY WBC SEEN MANY GRAM NEGATIVE RODS EXCELLENT SPECIMEN - 90-100% WBCS    Culture   Final    MODERATE GROWTH PSEUDOMONAS AERUGINOSA MODERATE GROWTH STENOTROPHOMONAS MALTOPHILIA NORMAL FLORA ABSENT    Report Status 03/15/2015 FINAL  Final   Organism ID, Bacteria STENOTROPHOMONAS MALTOPHILIA  Final   Organism ID, Bacteria PSEUDOMONAS AERUGINOSA  Final      Susceptibility   Pseudomonas aeruginosa - MIC*    CEFTAZIDIME Value in next row Sensitive      SENSITIVE4    CIPROFLOXACIN Value in next row Sensitive      SENSITIVE1    GENTAMICIN Value in next row Resistant      RESISTANT>=16    IMIPENEM Value in next row Resistant      RESISTANT>=16    PIP/TAZO Value in next row Sensitive      SENSITIVE8    CEFEPIME Value in next row Sensitive      SENSITIVE8    * MODERATE GROWTH PSEUDOMONAS AERUGINOSA   Stenotrophomonas maltophilia - MIC*    LEVOFLOXACIN Value in next row Sensitive      SENSITIVE1    TRIMETH/SULFA Value in next row Sensitive      SENSITIVE<=20    * MODERATE GROWTH STENOTROPHOMONAS MALTOPHILIA    Coagulation Studies: No results for input(s): LABPROT, INR in the last 72 hours.  Urinalysis: No results for input(s): COLORURINE, LABSPEC, PHURINE, GLUCOSEU, HGBUR, BILIRUBINUR, KETONESUR, PROTEINUR, UROBILINOGEN, NITRITE, LEUKOCYTESUR in the last 72 hours.  Invalid input(s): APPERANCEUR    Imaging: US Venous Img Lower Bilateral  03/15/2015   CLINICAL DATA:  Fever, quadriplegic patient bed found.  EXAM: BILATERAL LOWER EXTREMITY VENOUS DOPPLER ULTRASOUND  TECHNIQUE: Gray-scale sonography with graded compression, as well as color Doppler and duplex ultrasound were performed to evaluate the lower extremity deep venous systems from the level of the common femoral vein and including the common femoral, femoral, profunda femoral, popliteal and calf veins including the  posterior tibial, peroneal and gastrocnemius veins when visible. The superficial great saphenous vein was also interrogated. Spectral Doppler was utilized to evaluate flow at rest and with distal augmentation maneuvers in the common femoral, femoral and popliteal veins.  COMPARISON:  None.  FINDINGS: RIGHT LOWER EXTREMITY  Common Femoral Vein: No evidence of thrombus. Normal compressibility, respiratory phasicity and response to augmentation.  Saphenofemoral Junction: No evidence of thrombus. Normal compressibility and flow on color Doppler imaging.  Profunda Femoral Vein: No  evidence of thrombus. Normal compressibility and flow on color Doppler imaging.  Femoral Vein: No evidence of thrombus. Normal compressibility, respiratory phasicity and response to augmentation.  Popliteal Vein: No evidence of thrombus. Normal compressibility, respiratory phasicity and response to augmentation.  Calf Veins: No evidence of thrombus. Normal compressibility and flow on color Doppler imaging.  Superficial Great Saphenous Vein: No evidence of thrombus. Normal compressibility and flow on color Doppler imaging.  LEFT LOWER EXTREMITY  Common Femoral Vein: No evidence of thrombus. Normal compressibility, respiratory phasicity and response to augmentation.  Saphenofemoral Junction: No evidence of thrombus. Normal compressibility and flow on color Doppler imaging.  Profunda Femoral Vein: No evidence of thrombus. Normal compressibility and flow on color Doppler imaging.  Femoral Vein: No evidence of thrombus. Normal compressibility, respiratory phasicity and response to augmentation.  Popliteal Vein: No evidence of thrombus. Normal compressibility, respiratory phasicity and response to augmentation.  Calf Veins: No evidence of thrombus. Normal compressibility and flow on color Doppler imaging.  Superficial Great Saphenous Vein: No evidence of thrombus. Normal compressibility and flow on color Doppler imaging.  IMPRESSION: No evidence of  deep venous thrombosis.   Electronically Signed   By: Genevive Bi M.D.   On: 03/15/2015 22:22      Assessment & Plan: Mr. Zackaria Burkey is a 24 y.o. white male with traumatic brain injury with quadriplegia, tracheostomy on chronic mechanical ventilation, seizure disorder, PEG placement, CSF shunt, diabetes mellitus type II, asthma who was admitted to [facility] on 03/07/2015 for bacteremia and pneumonia.   1. Hyponatremia: worsening with IV saline. Concern for SIADH versus cerebral salt wasting. Difficult to determine volume status with chronic peripheral edema and decreased muscle mass.  Usually, cerebral salt wasting has a high urine sodium, will attempt to check this level. Seems to be difficult to urine specimen. Suspect this is acute on chronic hyponatremia exacerbated by free water surplus.  - Agree with decreasing free water flushes with tube feeds.  - Stop IV fluids, Give IV furosemide to see this is excess free water. Serially check serum sodiums - Salt tabs and saline IV infusion usually will help if this is cerebral salt wasting.   2. Sepsis with pneumonia: peeudomonas and stenotrophomonas growing from BAL 9/2 and 8/29. Initial blood cultures with Acinetobacer on 8/27 - ID following. Current antibiotic regimen of inhaled tobramycin, levofloxacin - Unclear if fevers are neurogenic.   3. Seizure disorder status post TBI: on valproic acid and levetiracetam. No recent seizures   4. Anemia: normocytic with hemoglobin of 10.6. Concern for anemia of chronic illness. Continue to monitor.    LOS: 10 Clotilde Loth 9/7/20164:41 PM

## 2015-03-17 NOTE — Progress Notes (Signed)
Sleepy much of day.  Days and nights seem turned around. It was reported pt was awake all night. Tried trach collar with passi- muir valve for . Sats were 93- 96% but pt was drowsy and dad requested we let him sleep. Secretions have been white to tan and slightly yellow at times. Good cough effort. Janina Mayo site looks good. Lungs with rhonchi throughout. Temp 100.8-101.0 oral. . Dad states this is normal for pt. Dr Sampson Goon has IV levaquin and tobramycin nebs ordered  Dr Wynelle Link was consulted regarding low sodium.  Lasix was given with large unmeasured output in diapers.  Condom cath was reapplied but falls off. Dr Wynelle Link was called with repeat Na+ and urine osmolality and urine sodium results . Dr Wynelle Link will write orders for treatment. Pt and his father hope he will be discharged home in am.

## 2015-03-17 NOTE — Plan of Care (Signed)
Problem: Phase I Progression Outcomes Goal: Initial discharge plan identified Outcome: Progressing To home with father and home health nursing via EMS

## 2015-03-17 NOTE — Progress Notes (Signed)
RASS 0,  alert and awake this AM, father participated in ICU rounds this AM-discussion of possible d/c home No overt distress Remains vent dependent on high FiO2, PEEP weaned down to 40%.  Plan for TCT  Doing better this am, appears more comfotable.still with febrile episodes  Filed Vitals:   03/17/15 0500 03/17/15 0600 03/17/15 0700 03/17/15 0800  BP: 130/83 140/96 138/91 117/74  Pulse: 116 114 104 102  Temp:    100.8 F (38.2 C)  TempSrc:    Oral  Resp: Height:      Weight: 156 lb 1.4 oz (70.8 kg)     SpO2: 98% 100% 95% 99%   NAD HEENT WNL Trach site clean Absent BS on L, rhonchi on R, moderate mucopurulent secretions Tachy, reg, no M Abd soft, G-tube site clean Contractures, no edema  BMP Latest Ref Rng 03/17/2015 03/16/2015 03/15/2015  Glucose 65 - 99 mg/dL 409(W) 119(J) 478(G)  BUN 6 - 20 mg/dL Creatinine 0.61 - 1.24 mg/dL <9.56(O) <1.30(Q) <6.57(Q)  Sodium 135 - 145 mmol/L 125(L) 127(L) 128(L)  Potassium 3.5 - 5.1 mmol/L 4.7 3.8 4.1  Chloride 101 - 111 mmol/L 93(L) 92(L) 92(L)  CO2 22 - 32 mmol/L Calcium 8.9 - 10.3 mg/dL 4.6(N) 8.3(L) 8.6(L)    CBC Latest Ref Rng 03/17/2015 03/16/2015 03/15/2015  WBC 3.8 - 10.6 K/uL 13.5(H) 11.7(H) 11.3(H)  Hemoglobin 13.0 - 18.0 g/dL 10.6(L) 9.3(L) 9.8(L)  Hematocrit 40.0 - 52.0 % 30.8(L) 27.5(L) 28.4(L)  Platelets 150 - 440 K/uL 319 291 280    CXR: resolving R/L lung collapse  IMPRESSION: 24 yo white male with chronic vent dep resp failure with acute b/l pneumonia with failure to wean from vent With h/o TBI  Remote TBI Chronic trach and chronic nocturnal ventilation Chronic G-tube Acinetobacter bacteremia and pseudomonas PNA Severe, recurrent mucus plugging Recurrent left and right lung collapse due to mucus plugging. Bilateral lung aeration has improved today. Oxygen saturation has improved on a lower FiO2  FOB 8/29, 9/02  VDRF - wean as tolerated reactive sinus tachycardia,  improved  PLAN/REC: Cont full vent support - settings reviewed and/or adjusted-wean fio2 and to PS as tolerated and TCT as tolerated Cont vent bundle Wean down FiO2 as tolerated.  Cont chest percussion, bed and manual, and frequent trach suctioning -continue  albuterol, CPT.  Monitor I/Os cont TFs Abx per ID Sampson Goon) -decrease h2o flushes in setting of hyponatremia-obtain nephrology consult -plan to D/c home in next 24-48 hrs. Father happy about plan of action    I have personally obtained a history, examined the patient, evaluated Pertinent laboratory and RadioGraphic/imaging results, and  formulated the assessment and plan   The Patient requires high complexity decision making for assessment and support, frequent evaluation and titration of therapies. Time Spent with patient 35 mins   Aisia Correira Santiago Glad, M.D.  [facility]  Medical Director [facility] Medical Director [facility] Cardio-Pulmonary Department

## 2015-03-17 NOTE — Plan of Care (Signed)
Problem: Phase III Progression Outcomes Goal: Nutritional plan in place Outcome: Progressing Pt on tube feeds at goal

## 2015-03-17 NOTE — Care Management Note (Signed)
Case Management Note  Patient Details  Name: Roger Tate MRN: [id number] Date of Birth: May 20, 1991  Subjective/Objective:  Case discussed with team in progression. Plan is for patient to return home tomorrow with resumption of home care services provided by [facility]. Have spoken with Rennis Chris with [facility] in a face to face meeting regarding current level of care. Also spoke with [facility] with [facility] and updated of POC and Level of care.  Both agencies can resume services tomorrow. Patient will be discharged without PICC line and on antibiotics via PEG. Monica, CSW updated. Dad is in agreement with POC.              Action/Plan: Home tomorrow with resumption of home care services.  Expected Discharge Date:   03/18/2015               Expected Discharge Plan:  Home w Home Health Services  In-House Referral:     Discharge planning Services  CM Consult  Post Acute Care Choice:    Choice offered to:     DME Arranged:    DME Agency:     HH Arranged:    HH Agency:     Status of Service:  In process, will continue to follow  Medicare Important Message Given:    Date Medicare IM Given:    Medicare IM give by:    Date Additional Medicare IM Given:    Additional Medicare Important Message give by:     If discussed at Long Length of Stay Meetings, dates discussed:    Additional Comments:  Marily Memos, RN 03/17/2015, 11:18 AM

## 2015-03-17 NOTE — Progress Notes (Signed)
Nutrition Follow-up    INTERVENTION:   EN: pt tolerating 16 hours feedings of Vital 1.5 at 75 ml/hr, free water flush of 25 mL q 8 hours. Total free water 987 mL. Pt also on NS at 100 ml/hr; limited documentation of UOP as condom cath kept following off, not currently on. Discussed current sodium level during ICU rounds, MD ordering nephrology consult  NUTRITION DIAGNOSIS:   Increased nutrient needs related to acute illness as evidenced by estimated needs. Being addressed via TF  GOAL:   Patient will meet greater than or equal to 90% of their needs  MONITOR:    (Energy intake, Digestive system, Electrolyte and renal profile)   ASSESSMENT:    Pt remains on vent via trach, trach collar trial today as tolerated, using PMV as tolerated  EN: tolerating Vital 1.5 TF at rate of 75 ml/hr for 16 hours daily  Digestive System: no signs of TF intolerance, +BM 9/7, 10 mL residuals  Electrolyte and Renal Profile:  Recent Labs Lab 03/15/15 0435 03/16/15 0358 03/17/15 0443  BUN CREATININE <0.30* <0.30* <0.30*  NA 128* 127* 125*  K 4.1 3.8 4.7   Glucose Profile:  Recent Labs  03/17/15 0002 03/17/15 0342 03/17/15 0808  GLUCAP 259* 124* 172*   Nutritional Anemia Profile:  CBC Latest Ref Rng 03/17/2015 03/16/2015 03/15/2015  WBC 3.8 - 10.6 K/uL 13.5(H) 11.7(H) 11.3(H)  Hemoglobin 13.0 - 18.0 g/dL 10.6(L) 9.3(L) 9.8(L)  Hematocrit 40.0 - 52.0 % 30.8(L) 27.5(L) 28.4(L)  Platelets 150 - 440 K/uL 319 291 280    Meds: NS at 100 ml/hr, ss novolog, lantus, MVI  Urine Volume: 350 mL documented yesterday, not accurate as not able to keep condom cath on  Height:   Ht Readings from Last 1 Encounters:  03/07/15  (1.651 m)    Weight: noted weight trend  Wt Readings from Last 1 Encounters:  03/17/15 156 lb 1.4 oz (70.8 kg)   Filed Weights   03/07/15 1317 03/17/15 0500  Weight: 161 lb (73.029 kg) 156 lb 1.4 oz (70.8 kg)     BMI:  Body mass index is 25.97  kg/(m^2).  Estimated Nutritional Needs:   Kcal:  TEE 1931 kcals/d (Ve 9.4, Tmax 37.5)   Protein:  (1.2-2.0 g/kg) 88-146 g/d  Fluid:  (30-73ml/kg) 1610-9604 ml/d  EDUCATION NEEDS:   No education needs identified at this time  HIGH Care Level  Romelle Starcher MS, RD, LDN [phone] Pager

## 2015-03-17 NOTE — Progress Notes (Signed)
[facility] [place] at [facility]   PATIENT NAME: Roger Tate    MR#:  [id number]  DATE OF BIRTH:  12/08/1990  SUBJECTIVE:  CHIEF COMPLAINT:   Chief Complaint  Patient presents with  . 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 Admitted for aspiration pneumonitis. Bronchoalveolar lavage revealed light growth of Pseudomonas aeruginosa resistant to imipenem, gentamicin. Blood cultures 28th of August 2016 showed no growth, repeated on 02/08/2015 also showed no growth - Blood cultures from 03/06/2015 grew Acinetobacter, per history . - Patient remains on 80% FiO2, PEEP decreased  -Continues fevers despite antibiotic therapy. CT scan of the chest with IV contrast to rule out pulmonary embolism, as well as lower extremity Dopplers to rule out DVT were negative. CT scan reveals pneumonia. BAL cultures reveal moderate growth of Pseudomonas aeruginosa as well as stenotrophomonas maltophilia, patient is on levofloxacin and clindamycin at present. According to family members, patient has intermittent fevers at home, which were thought to be related to his brain injury. Sodium level is low at 125.   REVIEW OF SYSTEMS:  Review of Systems  Unable to perform ROS: critical illness    DRUG ALLERGIES:   Allergies  Allergen Reactions  . Sulfa Antibiotics Anaphylaxis  . Zosyn [Piperacillin-Tazobactam In Dex] Anaphylaxis  . Ciprofloxacin Other (See Comments)    HYPOTENSION  . Lorazepam Other (See Comments)    Per pt's father. Reaction unknown. Diazepam and clonazepam OK  . Vancomycin   . Penicillins Rash    VITALS:  Blood pressure 117/74, pulse 102, temperature 100.8 F (38.2 C), temperature source Oral, resp. rate 27, height  (1.651 m), weight 70.8 kg (156 lb 1.4 oz), SpO2 99 %.  PHYSICAL EXAMINATION:  Physical Exam  GENERAL:  24 y.o.-year-old patient lying in the bed, bedbound, contracted, in  mild respiratory distress.  Tachycardic,   Tachypneic, open his eyes and converses confused, more alert EYES: Pupils equal, round, reactive to light and accommodation. No scleral icterus.  HEENT: Head atraumatic, normocephalic. Oropharynx and nasopharynx clear.  NECK:  Supple, no jugular venous distention. No thyroid enlargement, no tenderness.  Trach in place. LUNGS: Coarse breath sounds bilaterally with rhonchi. Scattered bilateral wheezing, few rales at bases , no crepitation. On vent support via Trach tube. Better air entrance overall.  CARDIOVASCULAR: S1, S2 normal. No murmurs, rubs, or gallops.  ABDOMEN: Soft, nontender, nondistended. Bowel sounds present. No organomegaly or mass. PEG tube in place EXTREMITIES: 2+ pedal edema, No cyanosis, or clubbing. Atrophic extremities NEUROLOGIC: Eye contact. Nodding head sometimes. Not following commands. No facial droop, alert and interactive at baseline per family. Quadriplegic and bed bound. PSYCHIATRIC: Patient is alert.  SKIN: No obvious rash, lesion, or ulcer.   LABORATORY PANEL:   CBC  Recent Labs Lab 03/17/15 0443  WBC 13.5*  HGB 10.6*  HCT 30.8*  PLT 319   ------------------------------------------------------------------------------------------------------------------  Chemistries   Recent Labs Lab 03/17/15 0443  NA 125*  K 4.7  CL 93*  CO2 26  GLUCOSE 142*  BUN 8  CREATININE <0.30*  CALCIUM 8.7*   ------------------------------------------------------------------------------------------------------------------  Cardiac Enzymes No results for input(s): TROPONINI in the last 168 hours. ------------------------------------------------------------------------------------------------------------------  RADIOLOGY:  Ct Angio Chest Pe W/cm &/or Wo Cm  03/15/2015   CLINICAL DATA:  Respiratory distress. Dilution dependent. Chronic atelectasis. Concern for pulmonary embolism.  EXAM: CT ANGIOGRAPHY CHEST WITH CONTRAST  TECHNIQUE: Multidetector CT imaging of the chest  was performed using the standard protocol during bolus  administration of intravenous contrast. Multiplanar CT image reconstructions and MIPs were obtained to evaluate the vascular anatomy.  CONTRAST:  OMNIPAQUE IOHEXOL 350 MG/ML SOLN  COMPARISON:  Radiograph 03/14/2015  FINDINGS: Mediastinum/Nodes: Exam is degraded by ventilator dependent respiratory motion. No filling defects within the proximal pulmonary arteries. The lower lobe pulmonary arteries are poorly evaluated. No upper lobe filling defect identified. No acute findings aorta great vessels. No pericardial fluid.  Lungs/Pleura: There is dense consolidation in the RIGHT lower lobe. Patchy consolidation in the RIGHT middle lobe. These findings are consistent multi lobar pneumonia. Patchy consolidation in the RIGHT upper lobe additionally. LEFT lung is relatively clear.  Upper abdomen: Limited view of the liver, kidneys, pancreas are unremarkable. Normal adrenal glands.  Musculoskeletal: No aggressive osseous lesion.  Review of the MIP images confirms the above findings.  IMPRESSION: 1. No gross evidence of pulmonary embolism in suboptimal exam secondary to ventilator dependent respiratory motion. 2. Multifocal pneumonia within the RIGHT upper lobe, RIGHT middle lobe and RIGHT lower lobe with dense consolidation in the RIGHT lower lobe.   Electronically Signed   By: Genevive Bi M.D.   On: 03/15/2015 14:05   US Venous Img Lower Bilateral  03/15/2015   CLINICAL DATA:  Fever, quadriplegic patient bed found.  EXAM: BILATERAL LOWER EXTREMITY VENOUS DOPPLER ULTRASOUND  TECHNIQUE: Gray-scale sonography with graded compression, as well as color Doppler and duplex ultrasound were performed to evaluate the lower extremity deep venous systems from the level of the common femoral vein and including the common femoral, femoral, profunda femoral, popliteal and calf veins including the posterior tibial, peroneal and gastrocnemius veins when visible. The  superficial great saphenous vein was also interrogated. Spectral Doppler was utilized to evaluate flow at rest and with distal augmentation maneuvers in the common femoral, femoral and popliteal veins.  COMPARISON:  None.  FINDINGS: RIGHT LOWER EXTREMITY  Common Femoral Vein: No evidence of thrombus. Normal compressibility, respiratory phasicity and response to augmentation.  Saphenofemoral Junction: No evidence of thrombus. Normal compressibility and flow on color Doppler imaging.  Profunda Femoral Vein: No evidence of thrombus. Normal compressibility and flow on color Doppler imaging.  Femoral Vein: No evidence of thrombus. Normal compressibility, respiratory phasicity and response to augmentation.  Popliteal Vein: No evidence of thrombus. Normal compressibility, respiratory phasicity and response to augmentation.  Calf Veins: No evidence of thrombus. Normal compressibility and flow on color Doppler imaging.  Superficial Great Saphenous Vein: No evidence of thrombus. Normal compressibility and flow on color Doppler imaging.  LEFT LOWER EXTREMITY  Common Femoral Vein: No evidence of thrombus. Normal compressibility, respiratory phasicity and response to augmentation.  Saphenofemoral Junction: No evidence of thrombus. Normal compressibility and flow on color Doppler imaging.  Profunda Femoral Vein: No evidence of thrombus. Normal compressibility and flow on color Doppler imaging.  Femoral Vein: No evidence of thrombus. Normal compressibility, respiratory phasicity and response to augmentation.  Popliteal Vein: No evidence of thrombus. Normal compressibility, respiratory phasicity and response to augmentation.  Calf Veins: No evidence of thrombus. Normal compressibility and flow on color Doppler imaging.  Superficial Great Saphenous Vein: No evidence of thrombus. Normal compressibility and flow on color Doppler imaging.  IMPRESSION: No evidence of deep venous thrombosis.   Electronically Signed   By: Genevive Bi  M.D.   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  1. Sepsis due to Acinetobacter- - positive blood cultures- Acinetobacter- from 03/06/15 - repeat blood cultures from 03/07/15 and from the  first of September 2016 so far are negative - bronch and lavage and cultures growing Pseudomonas and stenotrophomonas , now on the levofloxacin IV, clindamycin - ID consult appreciated - Due to allergic reaction to meropenem, started on Cipro with Benadryl - Continues to spike fevers, possibly related to brain injury - As per Dr. Sampson Goon- need Levoflox up to 03/28/15, and clinda till 03/20/15 - UA with no infection -As Blood cultures remain negative, PICC line in place.   2. Acute on chronic hypoxic respiratory failure--secondary to pneumonia,? Collapse left lung on admission - s/p chronic trach, now on 45% % fio2, PEEP decreased to 5  centimeters - appreciate pulmonary consult - s/p bronch 03/08/15 done, CXR with improvement of left side followed by that and followed by Repeat bronch again on 03/09/2015, right sided mucus plug and consolidation, most recent bronchoscopy second of September 2016 by Dr. Darrol Angel mucous plug from left main bronchus was removed , now on Mucomyst inhalations, Robitussin .- WBC remains elevated , but stable - cont vent support.   - might be difficult to wean.  3. TBI and h/o seizure disorder- on keppra and depakote-doses adjusted as recent outpatient levels were high. Discussed with Dr. Malvin Johns in the past Also on baclofen for muscle spasms. Stable. No intervention. Patient was confused, likely delirious, given haldol, better with Haldol as well as sitter at the bedside  4. DM- better blood glucose control with advanced Lantus   to 45 units, adding pre-meal novolog for diabetes educator recommendation. On tube feeds, had to be  held  few days ago due to high residual, however, restarted again  5. Anxiety-cont home meds  6. DVT prophylaxis- lovenox, getting Dopplers to rule out deep vein thrombosis  7. High fevers of unclear etiology. Blood cultures are negative so far, likely noninfectious and related to brain injury, as per family  8.  Questionable dysuria  Versus voiding difficulty,  Get postvoid though bladder scan and place Foley catheter is if needed.   9. Hyponatremia- unknown reason for now.   May be due to infection, cont IV fluids- monitor.  All the records are reviewed and case discussed with Care Management/Social Workerr. Management plans discussed with the patient, family and they are in agreement.  CODE STATUS: FULL CODE Discussed is Dr. Belia Heman treatment plan as well as evaluation  TOTAL CRITICAL CARE TIME SPENT IN TAKING CARE OF THIS PATIENT: .   Altamese Dilling M.D on 03/17/2015 at 8:59 AM  Between 7am to 6pm - Pager - [phone]  After 6pm go to [email or web address] - password [email or web address] [facility]  [place] [facility]  Office  [phone]  CC: Primary care physician; Dorothey Baseman, MD

## 2015-03-18 DIAGNOSIS — J151 Pneumonia due to Pseudomonas: Secondary | ICD-10-CM

## 2015-03-18 LAB — BLOOD GAS, ARTERIAL
ACID-BASE EXCESS: 3.5 mmol/L — AB (ref 0.0–3.0)
Allens test (pass/fail): POSITIVE — AB
BICARBONATE: 26.7 meq/L (ref 21.0–28.0)
FIO2: 0.4
LHR: 15 {breaths}/min
MECHVT: 500 mL
Mechanical Rate: 15
O2 SAT: 97.2 %
PATIENT TEMPERATURE: 37.8
PEEP/CPAP: 5 cmH2O
PH ART: 7.48 — AB (ref 7.350–7.450)
PO2 ART: 89 mmHg (ref 83.0–108.0)
pCO2 arterial: 36 mmHg (ref 32.0–48.0)

## 2015-03-18 LAB — SODIUM: Sodium: 129 mmol/L — ABNORMAL LOW (ref 135–145)

## 2015-03-18 LAB — CBC
HEMATOCRIT: 31.7 % — AB (ref 40.0–52.0)
Hemoglobin: 10.9 g/dL — ABNORMAL LOW (ref 13.0–18.0)
MCH: 31.1 pg (ref 26.0–34.0)
MCHC: 34.3 g/dL (ref 32.0–36.0)
MCV: 90.5 fL (ref 80.0–100.0)
Platelets: 274 10*3/uL (ref 150–440)
RBC: 3.5 MIL/uL — AB (ref 4.40–5.90)
RDW: 14.2 % (ref 11.5–14.5)
WBC: 13.5 10*3/uL — AB (ref 3.8–10.6)

## 2015-03-18 LAB — BASIC METABOLIC PANEL
ANION GAP: 8 (ref 5–15)
BUN: 8 mg/dL (ref 6–20)
CALCIUM: 8.9 mg/dL (ref 8.9–10.3)
CO2: 26 mmol/L (ref 22–32)
Chloride: 93 mmol/L — ABNORMAL LOW (ref 101–111)
Creatinine, Ser: 0.32 mg/dL — ABNORMAL LOW (ref 0.61–1.24)
Glucose, Bld: 139 mg/dL — ABNORMAL HIGH (ref 65–99)
POTASSIUM: 4.4 mmol/L (ref 3.5–5.1)
Sodium: 127 mmol/L — ABNORMAL LOW (ref 135–145)

## 2015-03-18 LAB — GLUCOSE, CAPILLARY
GLUCOSE-CAPILLARY: 283 mg/dL — AB (ref 65–99)
GLUCOSE-CAPILLARY: 157 mg/dL — AB (ref 65–99)
Glucose-Capillary: 142 mg/dL — ABNORMAL HIGH (ref 65–99)
Glucose-Capillary: 263 mg/dL — ABNORMAL HIGH (ref 65–99)
Glucose-Capillary: 226 mg/dL — ABNORMAL HIGH (ref 65–99)

## 2015-03-18 LAB — COMPREHENSIVE METABOLIC PANEL
ALBUMIN: 2.7 g/dL — AB (ref 3.5–5.0)
ALT: 58 U/L (ref 17–63)
ANION GAP: 9 (ref 5–15)
AST: 74 U/L — ABNORMAL HIGH (ref 15–41)
Alkaline Phosphatase: 56 U/L (ref 38–126)
BUN: 10 mg/dL (ref 6–20)
CALCIUM: 8.8 mg/dL — AB (ref 8.9–10.3)
CHLORIDE: 95 mmol/L — AB (ref 101–111)
CO2: 26 mmol/L (ref 22–32)
Creatinine, Ser: 0.31 mg/dL — ABNORMAL LOW (ref 0.61–1.24)
GFR calc non Af Amer: >60 mL/min (ref >60)
Glucose, Bld: 211 mg/dL — ABNORMAL HIGH (ref 65–99)
POTASSIUM: 4.2 mmol/L (ref 3.5–5.1)
SODIUM: 130 mmol/L — AB (ref 135–145)
Total Bilirubin: 0.7 mg/dL (ref 0.3–1.2)
Total Protein: 7.3 g/dL (ref 6.5–8.1)

## 2015-03-18 MED ORDER — LEVOFLOXACIN 750 MG PO TABS: 750.0000 mg | ORAL_TABLET | Freq: Every day | ORAL | Status: AC

## 2015-03-18 MED ORDER — PRO-STAT SUGAR FREE PO LIQD: 30.0000 mL | Freq: Every day | ORAL | Status: AC

## 2015-03-18 MED ORDER — TOBRAMYCIN 300 MG/5ML IN NEBU: 300.0000 mg | INHALATION_SOLUTION | Freq: Two times a day (BID) | RESPIRATORY_TRACT | Status: AC

## 2015-03-18 NOTE — Discharge Summary (Signed)
[facility] - [place] at [facility]   PATIENT NAME: Roger Tate    MR#:  [id number]  DATE OF BIRTH:  1991-04-08  DATE OF ADMISSION:  03/07/2015 ADMITTING PHYSICIAN: Houston Siren, MD  DATE OF DISCHARGE:03/18/2015  PRIMARY CARE PHYSICIAN: Dorothey Baseman, MD    ADMISSION DIAGNOSIS:  Bacteremia [R78.81] Left lower lobe pneumonia [J18.9]  DISCHARGE DIAGNOSIS:  Active Problems:   Bacteremia   Lung collapse  pseudomonas in Bronchial lavage  ACINETOBACTER BAUMANNII in blood cx on 03/07/15  SECONDARY DIAGNOSIS:   Past Medical History  Diagnosis Date  . Brain injury 2002  . Diabetes mellitus without complication   . Seizures   . 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  1. Sepsis due to Acinetobacter- - positive blood cultures- Acinetobacter- from 03/06/15 - repeat blood cultures from 03/07/15 and from the first of September 2016 so far are negative - bronch and lavage and cultures growing Pseudomonas and stenotrophomonas , now on the levofloxacin IV, clindamycin - ID consult appreciated - Due to allergic reaction to meropenem, started on Cipro with Benadryl - Continues to spike fevers, possibly related to brain injury - As per Dr. Sampson Goon- need Levoflox up to 03/28/15, and clinda till 03/20/15 - UA with no infection -As Blood cultures remain negative, PICC line in place- later on d/c removed PICC line as no need for IV ABx.  - final recommendation from ID- Levaquin oral and tobramycin inhaled till 03/28/15.  2. Acute on chronic hypoxic respiratory failure--secondary to pneumonia,? Collapse left lung on admission - s/p chronic trach, now on 45% % fio2, PEEP decreased to 5 centimeters - appreciate pulmonary  consult - s/p bronch 03/08/15 done, CXR with improvement of left side followed by that and followed by Repeat bronch again on 03/09/2015, right sided mucus plug and consolidation, most recent bronchoscopy second of September 2016 by Dr. Darrol Angel mucous plug from left main bronchus was removed , now on Mucomyst inhalations, Robitussin .- WBC remains elevated , but stable - cont vent support.  - as per father - he has ventilator at home. And he put on it, if needed.  3. TBI and h/o seizure disorder- on keppra and depakote-doses adjusted as recent outpatient levels were high. Discussed with Dr. Malvin Johns in the past Also on baclofen for muscle spasms. Stable. No intervention. Patient was confused, likely delirious, given haldol, better with Haldol as well as sitter at the bedside  4. DM- better blood glucose control with advanced Lantus to 45 units, adding pre-meal novolog for diabetes educator recommendation. On tube feeds, had to be held few days ago due to high residual, however, restarted again  5. Anxiety-cont home meds  6. DVT prophylaxis- lovenox, getting Dopplers to rule out deep vein thrombosis  7. High fevers of unclear etiology. Blood cultures are negative so far, likely noninfectious and related to brain injury, as per family  8. Questionable dysuria Versus voiding difficulty, Get postvoid though bladder scan and place Foley catheter is if needed.   9. Hyponatremia- unknown reason for now.  May be due to infection, cont IV fluids- monitor.  appreciated nephrology consult- they gave tolvaptan- Na came up to 127.  DISCHARGE CONDITIONS:   stable  CONSULTS OBTAINED:  Treatment Team:  Clydie Braun, MD Wallis Bamberg  Belia Heman, MD Altamese Dilling, MD Lamont Dowdy, MD  DRUG ALLERGIES:   Allergies  Allergen Reactions  . Sulfa Antibiotics Anaphylaxis  . Zosyn [Piperacillin-Tazobactam In Dex] Anaphylaxis  . Ciprofloxacin Other (See Comments)    HYPOTENSION  . Lorazepam  Other (See Comments)    Per pt's father. Reaction unknown. Diazepam and clonazepam OK  . Vancomycin   . Penicillins Rash    DISCHARGE MEDICATIONS:   Current Discharge Medication List    START taking these medications   Details  Amino Acids-Protein Hydrolys (FEEDING SUPPLEMENT, PRO-STAT SUGAR FREE 64,) LIQD Take 30 mLs by mouth daily. Qty: 900 mL, Refills: 0    tobramycin, PF, (TOBI) 300 MG/5ML nebulizer solution Take 5 mLs (300 mg total) by nebulization 2 (two) times daily. Qty: 280 mL, Refills: 0      CONTINUE these medications which have CHANGED   Details  levofloxacin (LEVAQUIN) 750 MG tablet Take 1 tablet (750 mg total) by mouth daily. Qty: 11 tablet, Refills: 0      CONTINUE these medications which have NOT CHANGED   Details  ACCU-CHEK SMARTVIEW test strip Test 4 times a day (before each meal and at bedtime). Refills: 11    albuterol (PROVENTIL) (2.5 MG/3ML) 0.083% nebulizer solution Inhale 3 mLs into the lungs 4 (four) times daily as needed.    ascorbic acid (VITAMIN C) 250 MG CHEW Chew 250 mg by mouth daily.    baclofen (LIORESAL) 10 MG tablet Take 1 tablet by mouth 2 (two) times daily.    calcium carbonate (TUMS EX) 750 MG chewable tablet Chew 1 tablet by mouth daily.    cetirizine (ZYRTEC) 10 MG tablet Take 10 mg by mouth daily as needed for allergies or rhinitis.     clonazePAM (KLONOPIN) 1 MG tablet Take 1 mg by mouth as needed for anxiety (for 3 or more seizures within an hour.).     Dextromethorphan-Guaifenesin (GUAIFENESIN DM) 400-20 MG TABS Take 1 tablet by mouth every 12 (twelve) hours.    diphenhydrAMINE (BENADRYL) 25 mg capsule Take 25 mg by mouth daily as needed (to pretreat antibiotics.).     EPINEPHrine (EPIPEN JR) 0.15 MG/0.3ML injection Inject 0.15 mg into the muscle as needed.    FIBER PO Take 2 tablets by mouth daily.     glucagon (GLUCAGON EMERGENCY) 1 MG injection Inject 1 mg into the muscle as needed.    insulin aspart (NOVOLOG) 100  UNIT/ML injection Inject 2 Units into the skin as needed for high blood sugar. Prn before meals for blood glucose greater than 200    ketoconazole (NIZORAL) 2 % cream Apply 1 application topically 2 (two) times daily as needed for irritation.     lactulose (CHRONULAC) 10 GM/15ML solution Take 30 mLs by mouth 2 (two) times daily.    levETIRAcetam (KEPPRA) 100 MG/ML solution Place 2,250 mg into feeding tube 2 (two) times daily. Refills: 0    Multiple Vitamin (MULTI-VITAMINS PO) Take 2 each by mouth daily. Take gummies.    nystatin cream (MYCOSTATIN) Apply 1 application topically 3 (three) times daily as needed. For rash. Refills: 1    OXYGEN Inhale 1-2 L into the lungs at bedtime.    PULMICORT 0.5 MG/2ML nebulizer solution Inhale 1 vial into the lungs 2 (two) times daily. Refills: 3    ranitidine (ZANTAC) 75 MG/5ML syrup Take 10 mLs by mouth 2 (two) times daily. Refills: 4    Valproic Acid (DEPAKENE) 250 MG/5ML SYRP syrup Take 10-15 mLs by mouth See admin instructions.  Take 10 mls orally twice daily (5am, 3pm) and 15 mls orally nightly at 11pm. Refills: 4    bisacodyl (LAXATIVE) 10 MG suppository Place 1 suppository rectally at bedtime as needed for mild constipation, moderate constipation or severe constipation.       STOP taking these medications     cefUROXime (CEFTIN) 500 MG tablet      levETIRAcetam (KEPPRA) 250 MG tablet      linezolid (ZYVOX) 600 MG tablet          DISCHARGE INSTRUCTIONS:    Follow with PMD in 1 week.  If you experience worsening of your admission symptoms, develop shortness of breath, life threatening emergency, suicidal or homicidal thoughts you must seek medical attention immediately by calling 911 or calling your MD immediately  if symptoms less severe.  You Must read complete instructions/literature along with all the possible adverse reactions/side effects for all the Medicines you take and that have been prescribed to you. Take any new  Medicines after you have completely understood and accept all the possible adverse reactions/side effects.   Please note  You were cared for by a hospitalist during your hospital stay. If you have any questions about your discharge medications or the care you received while you were in the hospital after you are discharged, you can call the unit and asked to speak with the hospitalist on call if the hospitalist that took care of you is not available. Once you are discharged, your primary care physician will handle any further medical issues. Please note that NO REFILLS for any discharge medications will be authorized once you are discharged, as it is imperative that you return to your primary care physician (or establish a relationship with a primary care physician if you do not have one) for your aftercare needs so that they can reassess your need for medications and monitor your lab values.    Today   CHIEF COMPLAINT:   Chief Complaint  Patient presents with  . Blood Infection    HISTORY OF PRESENT ILLNESS:  Rollyn Scialdone  is a 24 y.o. male with a known history of traumatic brain injury, diabetes, seizures, asthma, status post PEG, status post tracheostomy who presents to the hospital as he was noted to have positive blood cultures. Patient apparently was brought to the emergency room yesterday as he was having some bloody tracheal secretions. Patient was seen in the ER and diagnosed with a pneumonia and discharged home yesterday on oral Levaquin. Today the blood cultures from yesterday were known to be positive with gram-negative rod and the patient's family was called to bring the patient back to the emergency room. As per the patient's parents he has not had any fever, nausea, vomiting, abdominal pain, diarrhea. He's only had some bloody tracheal secretions which they attribute to some trauma to the tracheostomy by the home health nurse. Given his bacteremia hospitalist services were contacted  further treatment and evaluation.   VITAL SIGNS:  Blood pressure 108/64, pulse 108, temperature 100 F (37.8 C), temperature source Axillary, resp. rate 23, height 5\' 5"  (1.651 m), weight 70.8 kg (156 lb 1.4 oz), SpO2 95 %.  I/O:   Intake/Output Summary (Last 24 hours) at 03/18/15 1151 Last data filed at 03/18/15 1000  Gross per 24 hour  Intake   1745 ml  Output      0 ml  Net   1745 ml    PHYSICAL EXAMINATION:  GENERAL: 24 y.o.-year-old patient lying in the bed, bedbound, contracted,  in mild respiratory distress. Tachycardic, Tachypneic, open his eyes and converses confused, more alert EYES: Pupils equal, round, reactive to light and accommodation. No scleral icterus.  HEENT: Head atraumatic, normocephalic. Oropharynx and nasopharynx clear.  NECK: Supple, no jugular venous distention. No thyroid enlargement, no tenderness.  Trach in place. LUNGS: Coarse breath sounds bilaterally with rhonchi. Scattered bilateral wheezing, few rales at bases , no crepitation. On vent support via Trach tube. Better air entrance overall.  CARDIOVASCULAR: S1, S2 normal. No murmurs, rubs, or gallops.  ABDOMEN: Soft, nontender, nondistended. Bowel sounds present. No organomegaly or mass. PEG tube in place EXTREMITIES: 2+ pedal edema, No cyanosis, or clubbing. Atrophic extremities NEUROLOGIC: Eye contact. Nodding head sometimes. Not following commands. No facial droop, alert and interactive at baseline per family. Quadriplegic and bed bound. PSYCHIATRIC: Patient is alert.  SKIN: No obvious rash, lesion, or ulcer.   DATA REVIEW:   CBC  Recent Labs Lab 03/18/15 0330  WBC 13.5*  HGB 10.9*  HCT 31.7*  PLT 274    Chemistries   Recent Labs Lab 03/18/15 1005  NA 130*  129*  K 4.2  CL 95*  CO2 26  GLUCOSE 211*  BUN 10  CREATININE 0.31*  CALCIUM 8.8*  AST 74*  ALT 58  ALKPHOS 56  BILITOT 0.7    Cardiac Enzymes No results for input(s): TROPONINI in the last 168  hours.  Microbiology Results  Results for orders placed or performed during the hospital encounter of 03/07/15  Blood culture (routine x 2)     Status: None   Collection Time: 03/07/15  3:00 PM  Result Value Ref Range Status   Specimen Description BLOOD LEFT ASSIST CONTROL  Final   Special Requests   Final    BOTTLES DRAWN AEROBIC AND ANAEROBIC  AER 5CC ANA 2CC   Culture NO GROWTH 5 DAYS  Final   Report Status 03/12/2015 FINAL  Final  Blood culture (routine x 2)     Status: None   Collection Time: 03/07/15  4:42 PM  Result Value Ref Range Status   Specimen Description BLOOD LEFT ARM  Final   Special Requests BOTTLES DRAWN AEROBIC AND ANAEROBIC  4CC  Final   Culture NO GROWTH 5 DAYS  Final   Report Status 03/12/2015 FINAL  Final  Urine culture     Status: None   Collection Time: 03/07/15  6:13 PM  Result Value Ref Range Status   Specimen Description URINE, CLEAN CATCH  Final   Special Requests NONE  Final   Culture NO GROWTH  Final   Report Status 03/08/2015 FINAL  Final  MRSA PCR Screening     Status: None   Collection Time: 03/07/15  7:43 PM  Result Value Ref Range Status   MRSA by PCR NEGATIVE NEGATIVE Final    Comment:        The GeneXpert MRSA Assay (FDA approved for NASAL specimens only), is one component of a comprehensive MRSA colonization surveillance program. It is not intended to diagnose MRSA infection nor to guide or monitor treatment for MRSA infections.   Culture, bal-quantitative     Status: None   Collection Time: 03/08/15  2:50 PM  Result Value Ref Range Status   Specimen Description BRONCHIAL ALVEOLAR LAVAGE  Final   Special Requests NONE  Final   Gram Stain   Final    GOOD SPECIMEN - 80-90% WBCS MODERATE WBC SEEN RARE GRAM POSITIVE COCCI IN PAIRS RARE GRAM NEGATIVE RODS    Culture LIGHT  GROWTH PSEUDOMONAS AERUGINOSA  Final   Report Status 03/11/2015 FINAL  Final   Organism ID, Bacteria PSEUDOMONAS AERUGINOSA  Final      Susceptibility    Pseudomonas aeruginosa - MIC*    CEFTAZIDIME Value in next row Sensitive      SENSITIVE2    CIPROFLOXACIN Value in next row Sensitive      SENSITIVE0.5    GENTAMICIN Value in next row Resistant      RESISTANT>=16    IMIPENEM Value in next row Resistant      RESISTANT>=16    PIP/TAZO Value in next row Sensitive      SENSITIVE<=4    CEFEPIME Value in next row Sensitive      SENSITIVE8    * LIGHT GROWTH PSEUDOMONAS AERUGINOSA  Culture, blood (routine x 2)     Status: None   Collection Time: 03/11/15  1:43 PM  Result Value Ref Range Status   Specimen Description Blood  Final   Special Requests Normal  Final   Culture NO GROWTH 5 DAYS  Final   Report Status 03/16/2015 FINAL  Final  Culture, blood (routine x 2)     Status: None   Collection Time: 03/11/15  1:50 PM  Result Value Ref Range Status   Specimen Description BLOOD LEFT HAND  Final   Special Requests   Final    BOTTLES DRAWN AEROBIC AND ANAEROBIC 5CC AERP 3 CC ANAERO   Culture NO GROWTH 5 DAYS  Final   Report Status 03/16/2015 FINAL  Final  Anaerobic culture     Status: None   Collection Time: 03/11/15  5:00 PM  Result Value Ref Range Status   Specimen Description FLUID  Final   Special Requests Normal  Final   Culture NO ANAEROBES ISOLATED  Final   Report Status 03/16/2015 FINAL  Final  Culture, blood (single)     Status: None   Collection Time: 03/11/15  5:00 PM  Result Value Ref Range Status   Specimen Description BLOOD RIGHT ASSIST CONTROL  Final   Special Requests BOTTLES DRAWN AEROBIC AND ANAEROBIC  Final   Culture NO GROWTH 5 DAYS  Final   Report Status 03/16/2015 FINAL  Final  Culture, bal-quantitative     Status: None   Collection Time: 03/12/15 11:53 AM  Result Value Ref Range Status   Specimen Description BRONCHIAL ALVEOLAR LAVAGE  Final   Special Requests NONE  Final   Gram Stain   Final    MANY WBC SEEN MANY GRAM NEGATIVE RODS EXCELLENT SPECIMEN - 90-100% WBCS    Culture   Final    MODERATE  GROWTH PSEUDOMONAS AERUGINOSA MODERATE GROWTH STENOTROPHOMONAS MALTOPHILIA NORMAL FLORA ABSENT    Report Status 03/15/2015 FINAL  Final   Organism ID, Bacteria STENOTROPHOMONAS MALTOPHILIA  Final   Organism ID, Bacteria PSEUDOMONAS AERUGINOSA  Final      Susceptibility   Pseudomonas aeruginosa - MIC*    CEFTAZIDIME Value in next row Sensitive      SENSITIVE4    CIPROFLOXACIN Value in next row Sensitive      SENSITIVE1    GENTAMICIN Value in next row Resistant      RESISTANT>=16    IMIPENEM Value in next row Resistant      RESISTANT>=16    PIP/TAZO Value in next row Sensitive      SENSITIVE8    CEFEPIME Value in next row Sensitive      SENSITIVE8    * MODERATE GROWTH PSEUDOMONAS AERUGINOSA   Stenotrophomonas  maltophilia - MIC*    LEVOFLOXACIN Value in next row Sensitive      SENSITIVE1    TRIMETH/SULFA Value in next row Sensitive      SENSITIVE<=20    * MODERATE GROWTH STENOTROPHOMONAS MALTOPHILIA    RADIOLOGY:  No results found.  EKG:   Orders placed or performed during the hospital encounter of 01/14/15  . EKG 12-Lead  . EKG 12-Lead  . EKG      Management plans discussed with the patient, family and they are in agreement.  CODE STATUS:     Code Status Orders        Start     Ordered   03/07/15 1910  Full code   Continuous     03/07/15 1909      TOTAL TIME TAKING CARE OF THIS PATIENT: 40 minutes.    Altamese Dilling M.D on 03/18/2015 at 11:51 AM  Between 7am to 6pm - Pager - [phone]  After 6pm go to [email or web address] - password [email or web address] [facility]  [facility]  Office  [phone]  CC: Primary care physician; Dorothey Baseman, MD

## 2015-03-18 NOTE — Progress Notes (Signed)
Pt was sleepy this am.  ABG result was good.  Awake and alert later in morning. Placed on trach collar and tolerated well.  Father states his son is at baseline and is ready to go home. He is confident with the care provided at home.  He verbalizes very good understanding of meds, new ABX prescriptions, home care and symptoms to report. Father has spoken to both Dr Greggory Brandy and Dr Belia Heman regarding home care and new RX. Pt with rhonchi throughout lung fields is less than yesterday.  Secretions tan to white. Tolerates suctioning well. Sodium has improved to 129 and 130. And Dr Wynelle Link aware of most recent Na+ and pt plans for discharge soon.

## 2015-03-18 NOTE — Care Management Note (Signed)
Case Management Note  Patient Details  Name: Roger Tate MRN: [id number] Date of Birth: Dec 31, 1990  Subjective/Objective:  [facility] to transport home today with pick up at approximately 3 pm. Primary nurse, Pam notified,dad updated and MAxim and BAyada notified with resumption of care today                  Action/Plan:   Expected Discharge Date:    03/18/2015              Expected Discharge Plan:  Home w Home Health Services  In-House Referral:     Discharge planning Services  CM Consult  Post Acute Care Choice:    Choice offered to:     DME Arranged:    DME Agency:     HH Arranged:    HH Agency:     Status of Service:  Complete  Medicare Important Message Given:    Date Medicare IM Given:    Medicare IM give by:    Date Additional Medicare IM Given:    Additional Medicare Important Message give by:     If discussed at Long Length of Stay Meetings, dates discussed:    Additional Comments:  Marily Memos, RN 03/18/2015, 11:30 AM

## 2015-03-18 NOTE — Discharge Instructions (Signed)
Follow with PMD in 1-2 weeks. °

## 2015-03-18 NOTE — Care Management Note (Signed)
Case Management Note  Patient Details  Name: Roger Tate MRN: [id number] Date of Birth: [birth year]  Subjective/Objective:   Patient on trach collar. Dad is insistent that he doesn't want to wait on care link. Nurse scheduled transport with [employer]. RNCM notified [facility]link                 Action/Plan:   Expected Discharge Date:                  Expected Discharge Plan:  Home w Home Health Services  In-House Referral:     Discharge planning Services  CM Consult  Post Acute Care Choice:    Choice offered to:     DME Arranged:    DME Agency:     HH Arranged:    HH Agency:     Status of Service:  In process, will continue to follow  Medicare Important Message Given:    Date Medicare IM Given:    Medicare IM give by:    Date Additional Medicare IM Given:    Additional Medicare Important Message give by:     If discussed at Long Length of Stay Meetings, dates discussed:    Additional Comments:  Marily Memos, RN 03/18/2015, 1:11 PM

## 2015-03-18 NOTE — Progress Notes (Signed)
RASS 0,  alert and awake this AM,  discussion of possible d/c home No overt distress Remains vent dependent on high FiO2, PEEP weaned down to 40%.  Plan for TCT as tolerated, secretions better this AM, sodium level back up to 127  Doing better this am, appears more comfotable  Filed Vitals:   03/18/15 0326 03/18/15 0400 03/18/15 0500 03/18/15 0600  BP:  121/74 115/71 110/64  Pulse:  114 109 104  Temp: 100.8 F (38.2 C)     TempSrc:      Resp:  Height:      Weight:      SpO2:  96% 97% 97%   NAD HEENT WNL Trach site clean Absent BS on L, rhonchi on R, moderate mucopurulent secretions Tachy, reg, no M Abd soft, G-tube site clean Contractures, no edema  BMP Latest Ref Rng 03/18/2015 03/17/2015 03/17/2015  Glucose 65 - 99 mg/dL 161(W) - -  BUN 6 - 20 mg/dL 8 - -  Creatinine 9.60 - 1.24 mg/dL 4.54(U) - -  Sodium 981 - 145 mmol/L 127(L) 121(L) 122(L)  Potassium 3.5 - 5.1 mmol/L 4.4 - -  Chloride 101 - 111 mmol/L 93(L) - -  CO2 22 - 32 mmol/L 26 - -  Calcium 8.9 - 10.3 mg/dL 8.9 - -    CBC Latest Ref Rng 03/18/2015 03/17/2015 03/16/2015  WBC 3.8 - 10.6 K/uL 13.5(H) 13.5(H) 11.7(H)  Hemoglobin 13.0 - 18.0 g/dL 10.9(L) 10.6(L) 9.3(L)  Hematocrit 40.0 - 52.0 % 31.7(L) 30.8(L) 27.5(L)  Platelets 150 - 440 K/uL 274 319 291    CXR: resolving R/L lung collapse  IMPRESSION: 24 yo white male with chronic vent dep resp failure with acute b/l pneumonia with failure to wean from vent With h/o TBI  Remote TBI Chronic trach and chronic  ventilation Chronic G-tube Acinetobacter bacteremia and pseudomonas PNA Severe, recurrent mucus plugging-improving Recurrent left and right lung collapse due to mucus plugging. Bilateral lung aeration has improved today. Oxygen saturation has improved on a lower FiO2  FOB 8/29, 9/02  VDRF - wean as tolerated reactive sinus tachycardia, improved  PLAN/REC: Cont full vent support -as needed settings reviewed and/or adjusted- wean fio2 and to PS  as tolerated and TCT as tolerated Cont vent bundle Wean down FiO2 as tolerated.  Cont chest percussion, bed and manual, and frequent trach suctioning -continue dounebs, CPT.  Monitor I/Os cont TFs Abx per ID Sampson Goon) -sodium level improved -plan to D/c home in next 24 hrs if father agrees   I have personally obtained a history, examined the patient, evaluated Pertinent laboratory and RadioGraphic/imaging results, and  formulated the assessment and plan   The Patient requires high complexity decision making for assessment and support, frequent evaluation and titration of therapies. Time Spent with patient 35 mins   Kiyana Vazguez Santiago Glad, M.D.  [facility]  Medical Director [facility] Medical Director [facility] Cardio-Pulmonary Department

## 2015-03-18 NOTE — Progress Notes (Signed)
[facility]  ROUNDING NOTE   Subjective:   Patient resting comfortably. Sitter at bedside.  Na 127 - status post tolvaptan  Objective:  Vital signs in last 24 hours:  Temp:  [100.4 F (38 C)-101 F (38.3 C)] 100.8 F (38.2 C) (09/08 0326) Pulse Rate:  [104-119] 104 (09/08 0600) Resp:  [20-33] 20 (09/08 0600) BP: (110-147)/(57-99) 110/64 mmHg (09/08 0600) SpO2:  [94 %-100 %] 97 % (09/08 0600) FiO2 (%):  [40 %-50 %] 40 % (09/08 0800)  Weight change:  Filed Weights   03/07/15 1317 03/17/15 0500  Weight: 73.029 kg (161 lb) 70.8 kg (156 lb 1.4 oz)    Intake/Output: I/O last 3 completed shifts: In: 4135 [I.V.:2200; NG/GT:1685; IV Piggyback:250] Out: -    Intake/Output this shift:     Physical Exam: General: Critically ill  Head: Moist oral mucosal membranes  Eyes: Anicteric, PERRL  Neck: +tracheostomy  Lungs:  Coarse breath sounds, PRVC FiO2 40%  Heart: Sinus tachycardia  Abdomen:  Soft, nontender, +PEG  Extremities: trace peripheral and dependent edema.  Neurologic: Not following commands  Skin: No lesions       Basic Metabolic Panel:  Recent Labs Lab 03/14/15 0533 03/15/15 0435 03/16/15 0358 03/17/15 0443 03/17/15 1225 03/17/15 1815 03/18/15 0330  NA 130* 128* 127* 125* 122* 121* 127*  K 4.2 4.1 3.8 4.7  --   --  4.4  CL 94* 92* 92* 93*  --   --  93*  CO2 27 28 28 26   --   --  26  GLUCOSE 266* 157* 115* 142*  --   --  139*  BUN 10 7 8 8   --   --  8  CREATININE <0.30* <0.30* <0.30* <0.30*  --   --  0.32*  CALCIUM 8.6* 8.6* 8.3* 8.7*  --   --  8.9    Liver Function Tests: No results for input(s): AST, ALT, ALKPHOS, BILITOT, PROT, ALBUMIN in the last 168 hours. No results for input(s): LIPASE, AMYLASE in the last 168 hours. No results for input(s): AMMONIA in the last 168 hours.  CBC:  Recent Labs Lab 03/14/15 0533 03/15/15 0435 03/16/15 0358 03/17/15 0443 03/18/15 0330  WBC 9.8 11.3* 11.7* 13.5* 13.5*  HGB 9.7* 9.8* 9.3*  10.6* 10.9*  HCT 28.0* 28.4* 27.5* 30.8* 31.7*  MCV 92.3 91.1 91.6 90.8 90.5  PLT 261 280 291 319 274    Cardiac Enzymes: No results for input(s): CKTOTAL, CKMB, CKMBINDEX, TROPONINI in the last 168 hours.  BNP: Invalid input(s): POCBNP  CBG:  Recent Labs Lab 03/17/15 1639 03/17/15 2011 03/18/15 0007 03/18/15 0409 03/18/15 0727  GLUCAP 249* 283* 263* 157* 142*    Microbiology: Results for orders placed or performed during the hospital encounter of 03/07/15  Blood culture (routine x 2)     Status: None   Collection Time: 03/07/15  3:00 PM  Result Value Ref Range Status   Specimen Description BLOOD LEFT ASSIST CONTROL  Final   Special Requests   Final    BOTTLES DRAWN AEROBIC AND ANAEROBIC  AER 5CC ANA 2CC   Culture NO GROWTH 5 DAYS  Final   Report Status 03/12/2015 FINAL  Final  Blood culture (routine x 2)     Status: None   Collection Time: 03/07/15  4:42 PM  Result Value Ref Range Status   Specimen Description BLOOD LEFT ARM  Final   Special Requests BOTTLES DRAWN AEROBIC AND ANAEROBIC  4CC  Final   Culture NO GROWTH  5 DAYS  Final   Report Status 03/12/2015 FINAL  Final  Urine culture     Status: None   Collection Time: 03/07/15  6:13 PM  Result Value Ref Range Status   Specimen Description URINE, CLEAN CATCH  Final   Special Requests NONE  Final   Culture NO GROWTH  Final   Report Status 03/08/2015 FINAL  Final  MRSA PCR Screening     Status: None   Collection Time: 03/07/15  7:43 PM  Result Value Ref Range Status   MRSA by PCR NEGATIVE NEGATIVE Final    Comment:        The GeneXpert MRSA Assay (FDA approved for NASAL specimens only), is one component of a comprehensive MRSA colonization surveillance program. It is not intended to diagnose MRSA infection nor to guide or monitor treatment for MRSA infections.   Culture, bal-quantitative     Status: None   Collection Time: 03/08/15  2:50 PM  Result Value Ref Range Status   Specimen Description  BRONCHIAL ALVEOLAR LAVAGE  Final   Special Requests NONE  Final   Gram Stain   Final    GOOD SPECIMEN - 80-90% WBCS MODERATE WBC SEEN RARE GRAM POSITIVE COCCI IN PAIRS RARE GRAM NEGATIVE RODS    Culture LIGHT GROWTH PSEUDOMONAS AERUGINOSA  Final   Report Status 03/11/2015 FINAL  Final   Organism ID, Bacteria PSEUDOMONAS AERUGINOSA  Final      Susceptibility   Pseudomonas aeruginosa - MIC*    CEFTAZIDIME Value in next row Sensitive      SENSITIVE2    CIPROFLOXACIN Value in next row Sensitive      SENSITIVE0.5    GENTAMICIN Value in next row Resistant      RESISTANT>=16    IMIPENEM Value in next row Resistant      RESISTANT>=16    PIP/TAZO Value in next row Sensitive      SENSITIVE<=4    CEFEPIME Value in next row Sensitive      SENSITIVE8    * LIGHT GROWTH PSEUDOMONAS AERUGINOSA  Culture, blood (routine x 2)     Status: None   Collection Time: 03/11/15  1:43 PM  Result Value Ref Range Status   Specimen Description Blood  Final   Special Requests Normal  Final   Culture NO GROWTH 5 DAYS  Final   Report Status 03/16/2015 FINAL  Final  Culture, blood (routine x 2)     Status: None   Collection Time: 03/11/15  1:50 PM  Result Value Ref Range Status   Specimen Description BLOOD LEFT HAND  Final   Special Requests   Final    BOTTLES DRAWN AEROBIC AND ANAEROBIC 5CC AERP 3 CC ANAERO   Culture NO GROWTH 5 DAYS  Final   Report Status 03/16/2015 FINAL  Final  Anaerobic culture     Status: None   Collection Time: 03/11/15  5:00 PM  Result Value Ref Range Status   Specimen Description FLUID  Final   Special Requests Normal  Final   Culture NO ANAEROBES ISOLATED  Final   Report Status 03/16/2015 FINAL  Final  Culture, blood (single)     Status: None   Collection Time: 03/11/15  5:00 PM  Result Value Ref Range Status   Specimen Description BLOOD RIGHT ASSIST CONTROL  Final   Special Requests BOTTLES DRAWN AEROBIC AND ANAEROBIC  Final   Culture NO GROWTH 5 DAYS  Final    Report Status 03/16/2015 FINAL  Final  Culture,  bal-quantitative     Status: None   Collection Time: 03/12/15 11:53 AM  Result Value Ref Range Status   Specimen Description BRONCHIAL ALVEOLAR LAVAGE  Final   Special Requests NONE  Final   Gram Stain   Final    MANY WBC SEEN MANY GRAM NEGATIVE RODS EXCELLENT SPECIMEN - 90-100% WBCS    Culture   Final    MODERATE GROWTH PSEUDOMONAS AERUGINOSA MODERATE GROWTH STENOTROPHOMONAS MALTOPHILIA NORMAL FLORA ABSENT    Report Status 03/15/2015 FINAL  Final   Organism ID, Bacteria STENOTROPHOMONAS MALTOPHILIA  Final   Organism ID, Bacteria PSEUDOMONAS AERUGINOSA  Final      Susceptibility   Pseudomonas aeruginosa - MIC*    CEFTAZIDIME Value in next row Sensitive      SENSITIVE4    CIPROFLOXACIN Value in next row Sensitive      SENSITIVE1    GENTAMICIN Value in next row Resistant      RESISTANT>=16    IMIPENEM Value in next row Resistant      RESISTANT>=16    PIP/TAZO Value in next row Sensitive      SENSITIVE8    CEFEPIME Value in next row Sensitive      SENSITIVE8    * MODERATE GROWTH PSEUDOMONAS AERUGINOSA   Stenotrophomonas maltophilia - MIC*    LEVOFLOXACIN Value in next row Sensitive      SENSITIVE1    TRIMETH/SULFA Value in next row Sensitive      SENSITIVE<=20    * MODERATE GROWTH STENOTROPHOMONAS MALTOPHILIA    Coagulation Studies: No results for input(s): LABPROT, INR in the last 72 hours.  Urinalysis: No results for input(s): COLORURINE, LABSPEC, PHURINE, GLUCOSEU, HGBUR, BILIRUBINUR, KETONESUR, PROTEINUR, UROBILINOGEN, NITRITE, LEUKOCYTESUR in the last 72 hours.  Invalid input(s): APPERANCEUR    Imaging: No results found.   Medications:   . feeding supplement (VITAL 1.5 CAL) 1,000 mL (03/17/15 0800)   . antiseptic oral rinse  7 mL Mouth Rinse QID  . baclofen  10 mg Per Tube BID  . calcium carbonate  1 tablet Oral Daily  . chlorhexidine gluconate  15 mL Mouth Rinse BID  . clotrimazole   Topical BID  .  enoxaparin (LOVENOX) injection  40 mg Subcutaneous Q24H  . feeding supplement (PRO-STAT SUGAR FREE 64)  30 mL Oral Daily  . fentaNYL (SUBLIMAZE) injection  50 mcg Intravenous Once  . free water  25 mL Per Tube 3 times per day  . guaiFENesin  200 mg Oral Q4H  . insulin aspart  0-9 Units Subcutaneous 6 times per day  . insulin aspart  4 Units Subcutaneous QID  . insulin glargine  45 Units Subcutaneous Daily  . ipratropium-albuterol  3 mL Nebulization Q6H  . levETIRAcetam  1,500 mg Per Tube BID  . levofloxacin (LEVAQUIN) IV  750 mg Intravenous Q24H  . loratadine  10 mg Oral Daily  . multivitamin with minerals   Oral Daily  . nystatin cream  1 application Topical TID  . ranitidine  150 mg Per Tube BID  . tobramycin (PF)  300 mg Nebulization BID  . tolvaptan  15 mg Oral Q24H  . Valproic Acid  500 mg Per Tube TID  . vitamin C  250 mg Oral Daily   acetaminophen **OR** acetaminophen, bisacodyl, clonazePAM, diazepam, diphenhydrAMINE, fentaNYL (SUBLIMAZE) injection, ibuprofen, lactulose, ondansetron **OR** ondansetron (ZOFRAN) IV  Assessment/ Plan:  Mr. Roger Tate is a 24 y.o. white male with traumatic brain injury with quadriplegia, tracheostomy on chronic mechanical ventilation, seizure disorder, PEG  placement, CSF shunt, diabetes mellitus type II, asthma who was admitted to [facility] on 03/07/2015 for bacteremia and pneumonia.   1. Hyponatremia: worsening with IV saline. Concern for SIADH versus cerebral salt wasting. More likely due to acute SIADH from lung injury and pneumonia. Acute on chronic hyponatremia exacerbated by free water surplus.  Difficult to determine volume status with chronic peripheral edema and decreased muscle mass.  Usually, cerebral salt wasting has a high urine sodium, will attempt to check this level.  - no recent albumin - Stop IV fluids, Limit free water. Serially check serum sodiums - Continue tolvaptan, goal sodium of >130  2. Sepsis with pneumonia: pseudomonas  and stenotrophomonas growing from BAL 9/2 and 8/29. Initial blood cultures with Acinetobacer on 8/27 - ID following. Current antibiotic regimen of inhaled tobramycin, levofloxacin - Unclear if fevers are neurogenic.   3. Seizure disorder status post TBI: on valproic acid and levetiracetam. No recent seizures   4. Anemia: normocytic with hemoglobin of 10.6. Concern for anemia of chronic illness. Continue to monitor.    LOS: 11 Roger Tate 9/8/20169:39 AM

## 2015-03-18 NOTE — Plan of Care (Signed)
Problem: Phase I Progression Outcomes Goal: Baseline oxygen/pH stable Outcome: Completed/Met Date Met:  03/18/15 On 40 % trach collar with speaking valve at intervals.  Tolerates well with good sats and strong voice. Goal: Progressing towards optiumm acitivities Outcome: Completed/Met Date Met:  03/18/15 At baseline for prior admission. Pt  is quadrapalegic since TBI at 80yr Goal: Initial discharge plan identified Outcome: Completed/Met Date Met:  03/18/15 Pt sent home via EMS on trach collar. Prescriptions for tobramycin nebs and levaquin were given to father.  All discharge instructions regarding meds and symptoms to report with good teachback from father.  Father is very knowledgeable and confident with care of his son at home.   Problem: Phase II Progression Outcomes Goal: Date pt extubated/weaned off vent Outcome: Completed/Met Date Met:  03/18/15 Pt off vent and very alert. Communicating with speaking valve.  Sats good.  Father states he is at his baseline,    Problem: Phase III Progression Outcomes Goal: Transfer/discharge plan in place Outcome: Completed/Met Date Met:  03/18/15 Left via EMS at 1400.  Father following EMS

## 2015-03-20 LAB — MISC LABCORP TEST (SEND OUT): LABCORP TEST CODE: 96388

## 2015-06-06 ENCOUNTER — Emergency Department: Payer: Medicaid Other

## 2015-06-06 ENCOUNTER — Emergency Department
Admission: EM | Admit: 2015-06-06 | Discharge: 2015-06-06 | Disposition: A | Discharge status: Home / Self Care | Payer: Medicaid Other | Attending: Emergency Medicine | Admitting: Emergency Medicine

## 2015-06-06 DIAGNOSIS — Z7951 Long term (current) use of inhaled steroids: Secondary | ICD-10-CM | POA: Diagnosis not present

## 2015-06-06 DIAGNOSIS — J209 Acute bronchitis, unspecified: Secondary | ICD-10-CM | POA: Insufficient documentation

## 2015-06-06 DIAGNOSIS — Z794 Long term (current) use of insulin: Secondary | ICD-10-CM | POA: Diagnosis not present

## 2015-06-06 DIAGNOSIS — Z79899 Other long term (current) drug therapy: Secondary | ICD-10-CM | POA: Insufficient documentation

## 2015-06-06 DIAGNOSIS — Z880 Allergy status to penicillin: Secondary | ICD-10-CM | POA: Insufficient documentation

## 2015-06-06 DIAGNOSIS — Z930 Tracheostomy status: Secondary | ICD-10-CM | POA: Insufficient documentation

## 2015-06-06 DIAGNOSIS — R0602 Shortness of breath: Secondary | ICD-10-CM | POA: Diagnosis present

## 2015-06-06 DIAGNOSIS — E119 Type 2 diabetes mellitus without complications: Secondary | ICD-10-CM | POA: Diagnosis not present

## 2015-06-06 LAB — COMPREHENSIVE METABOLIC PANEL
ALK PHOS: 64 U/L (ref 38–126)
ALT: 61 U/L (ref 17–63)
AST: 45 U/L — AB (ref 15–41)
Albumin: 4.1 g/dL (ref 3.5–5.0)
Anion gap: 9 (ref 5–15)
BILIRUBIN TOTAL: 0.7 mg/dL (ref 0.3–1.2)
CALCIUM: 9.6 mg/dL (ref 8.9–10.3)
CO2: 25 mmol/L (ref 22–32)
Chloride: 99 mmol/L — ABNORMAL LOW (ref 101–111)
Creatinine, Ser: 0.39 mg/dL — ABNORMAL LOW (ref 0.61–1.24)
GFR calc Af Amer: >60 mL/min (ref >60)
Glucose, Bld: 128 mg/dL — ABNORMAL HIGH (ref 65–99)
POTASSIUM: 4.6 mmol/L (ref 3.5–5.1)
Sodium: 133 mmol/L — ABNORMAL LOW (ref 135–145)
TOTAL PROTEIN: 9 g/dL — AB (ref 6.5–8.1)

## 2015-06-06 LAB — CBC
HEMATOCRIT: 40.5 % (ref 40.0–52.0)
Hemoglobin: 13.8 g/dL (ref 13.0–18.0)
MCH: 30.3 pg (ref 26.0–34.0)
MCHC: 34 g/dL (ref 32.0–36.0)
MCV: 89 fL (ref 80.0–100.0)
Platelets: 175 10*3/uL (ref 150–440)
RBC: 4.55 MIL/uL (ref 4.40–5.90)
RDW: 14 % (ref 11.5–14.5)
WBC: 14.9 10*3/uL — ABNORMAL HIGH (ref 3.8–10.6)

## 2015-06-06 MED ORDER — LEVOFLOXACIN IN D5W 750 MG/150ML IV SOLN: 750.0000 mg | Freq: Once | INTRAVENOUS | Status: DC

## 2015-06-06 MED ORDER — LEVOFLOXACIN 25 MG/ML PO SOLN: 750.0000 mg | Freq: Every day | ORAL | Status: DC

## 2015-06-06 MED ORDER — LEVOFLOXACIN 750 MG PO TABS: 750.0000 mg | ORAL_TABLET | Freq: Every day | ORAL | Status: DC

## 2015-06-06 MED ORDER — DIPHENHYDRAMINE HCL 50 MG/ML IJ SOLN
25.0000 mg | Freq: Once | INTRAMUSCULAR | Status: DC
  Filled 2015-06-06: qty 1

## 2015-06-06 MED ORDER — LEVOFLOXACIN IN D5W 750 MG/150ML IV SOLN
INTRAVENOUS | Status: AC
  Filled 2015-06-06: qty 150

## 2015-06-06 MED ORDER — LEVOFLOXACIN 750 MG PO TABS
750.0000 mg | ORAL_TABLET | Freq: Once | ORAL | Status: AC
  Administered 2015-06-06: 750 mg via ORAL
  Filled 2015-06-06: qty 1

## 2015-06-06 MED ORDER — LEVOFLOXACIN 750 MG PO TABS: 750.0000 mg | ORAL_TABLET | Freq: Once | ORAL | Status: DC

## 2015-06-06 NOTE — ED Notes (Signed)
Pt BIB EMS from home. Pt on ventilator at night. Pts parents report that when taking him off his ventilator his sats dropped to 50s-60s and there were secretions and blood. Pts parents report not talking for short period of time. Pt came in 100% with EMS bagging.

## 2015-06-06 NOTE — ED Provider Notes (Signed)
[facility] Emergency Department Provider Note  ____________________________________________  Time seen: 3:05 PM  I have reviewed the triage vital signs and the nursing notes.   HISTORY  Chief Complaint Shortness of Breath    HPI Roger Tate is a 24 y.o. male status post TBI with VP shunt and tracheostomy who is chronically ventilator dependent at night is brought by his parents due to lower oxygen saturations on room air today and increased secretions. They have not changed then settings and his oxygen saturation has been good with the event and he has not had increased work of breathing. This morning when they discontinued the ventilator, on room air his oxygen saturation dropped down into the 50s. It often will be low and then when they talked to him and get him moving and more active, his sat will come up into the 90s. Today when they were talking to him in getting him more active his sat came up to a peak of 90. No fever or chills,vomiting diarrhea or pain. The patient denies complaints when asked yes/no questions     Past Medical History  Diagnosis Date  . Brain injury (HCC) 2002  . Diabetes mellitus without complication (HCC)   . Seizures (HCC)   . Asthma      Patient Active Problem List   Diagnosis Date Noted  . Lung collapse   . Bacteremia 03/07/2015  . Acute on chronic respiratory failure (HCC) 01/14/2015  . Left lower lobe pneumonia 01/14/2015  . Hypotension 01/14/2015  . Sepsis (HCC) 01/14/2015  . S/P ventriculoperitoneal shunt 01/14/2015  . H/O tracheostomy (HCC) 01/14/2015  . S/P percutaneous endoscopic gastrostomy (PEG) tube placement (HCC) 01/14/2015  . Scoliosis 01/14/2015  . 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 Brain surgery  2002  . Tracheostomy tube placement    . Sp perc place gastric tube    . Csf shunt       Current Outpatient Rx  Name  Route  Sig  Dispense   Refill  . ACCU-CHEK SMARTVIEW test strip      Test 4 times a day (before each meal and at bedtime).      11     Dispense as written.   Marland Kitchen albuterol (PROVENTIL) (2.5 MG/3ML) 0.083% nebulizer solution   Inhalation   Inhale 3 mLs into the lungs 4 (four) times daily as needed.         . Amino Acids-Protein Hydrolys (FEEDING SUPPLEMENT, PRO-STAT SUGAR FREE 64,) LIQD   Oral   Take 30 mLs by mouth daily.   900 mL   0   . ascorbic acid (VITAMIN C) 250 MG CHEW   Oral   Chew 250 mg by mouth daily.         . baclofen (LIORESAL) 10 MG tablet   Oral   Take 1 tablet by mouth 2 (two) times daily.         . bisacodyl (LAXATIVE) 10 MG suppository   Rectal   Place 1 suppository rectally at bedtime as needed for mild constipation, moderate constipation or severe constipation.          . calcium carbonate (TUMS EX) 750 MG chewable tablet   Oral   Chew 1 tablet by mouth daily.         . cetirizine (ZYRTEC) 10 MG tablet   Oral   Take 10 mg by mouth daily as needed for allergies or rhinitis.          Marland Kitchen  clonazePAM (KLONOPIN) 1 MG tablet   Oral   Take 1 mg by mouth as needed for anxiety (for 3 or more seizures within an hour.).          Marland Kitchen Dextromethorphan-Guaifenesin (GUAIFENESIN DM) 400-20 MG TABS   Oral   Take 1 tablet by mouth every 12 (twelve) hours.         . diphenhydrAMINE (BENADRYL) 25 mg capsule   Oral   Take 25 mg by mouth daily as needed (to pretreat antibiotics.).          Marland Kitchen EPINEPHrine (EPIPEN JR) 0.15 MG/0.3ML injection   Intramuscular   Inject 0.15 mg into the muscle as needed.         Marland Kitchen FIBER PO   Oral   Take 2 tablets by mouth daily.          Marland Kitchen glucagon (GLUCAGON EMERGENCY) 1 MG injection   Intramuscular   Inject 1 mg into the muscle as needed.         . insulin aspart (NOVOLOG) 100 UNIT/ML injection   Subcutaneous   Inject 2 Units into the skin as needed for high blood sugar. Prn before meals for blood glucose greater than 200          . ketoconazole (NIZORAL) 2 % cream   Topical   Apply 1 application topically 2 (two) times daily as needed for irritation.          Marland Kitchen lactulose (CHRONULAC) 10 GM/15ML solution   Oral   Take 30 mLs by mouth 2 (two) times daily.         Marland Kitchen levETIRAcetam (KEPPRA) 100 MG/ML solution   Per Tube   Place 2,250 mg into feeding tube 2 (two) times daily.      0   . levofloxacin (LEVAQUIN) 750 MG tablet   Oral   Take 1 tablet (750 mg total) by mouth daily.   7 tablet   0   . Multiple Vitamin (MULTI-VITAMINS PO)   Oral   Take 2 each by mouth daily. Take gummies.         Marland Kitchen nystatin cream (MYCOSTATIN)   Topical   Apply 1 application topically 3 (three) times daily as needed. For rash.      1   . OXYGEN   Inhalation   Inhale 1-2 L into the lungs at bedtime.         Marland Kitchen PULMICORT 0.5 MG/2ML nebulizer solution   Inhalation   Inhale 1 vial into the lungs 2 (two) times daily.      3     Dispense as written.   . ranitidine (ZANTAC) 75 MG/5ML syrup   Oral   Take 10 mLs by mouth 2 (two) times daily.      4   . Valproic Acid (DEPAKENE) 250 MG/5ML SYRP syrup   Oral   Take 10-15 mLs by mouth See admin instructions.  Take 10 mls orally twice daily (5am, 3pm) and 15 mls orally nightly at 11pm.      4      Allergies Sulfa antibiotics; Zosyn; Ciprofloxacin; Lorazepam; Vancomycin; and Penicillins   Family History  Problem Relation Age of Onset  . Bipolar disorder Mother   . Diabetes Father     Social History Social History  Substance Use Topics  . Smoking status: Never Smoker   . Smokeless tobacco: None  . Alcohol Use: No    Review of Systems  Constitutional:   No fever or chills. No weight  changes Eyes:   No blurry vision or double vision.  ENT:   No sore throat. Cardiovascular:   No chest pain. Respiratory:   No dyspnea. Increased cough, increased sputum production. Gastrointestinal:   Negative for abdominal pain, vomiting and diarrhea.  No BRBPR or  melena. Genitourinary:   Negative for dysuria, urinary retention, bloody urine, or difficulty urinating. Musculoskeletal:   Negative for back pain. No joint swelling or pain. Skin:   Negative for rash. Neurological:   Negative for headaches, focal weakness or numbness. Psychiatric:  No anxiety or depression.   Endocrine:  No hot/cold intolerance, changes in energy, or sleep difficulty.  10-point ROS otherwise negative.  ____________________________________________   PHYSICAL EXAM:  VITAL SIGNS: ED Triage Vitals  Enc Vitals Group     BP 06/06/15 1433 116/80 mmHg     Pulse Rate 06/06/15 1433 104     Resp 06/06/15 1500 17     Temp 06/06/15 1433 97.9 F (36.6 C)     Temp Source 06/06/15 1433 Oral     SpO2 06/06/15 1433 100 %     Weight 06/06/15 1433 175 lb (79.379 kg)     Height 06/06/15 1433  (1.676 m)     Head Cir --      Peak Flow --      Pain Score --      Pain Loc --      Pain Edu? --      Excl. in GC? --      Constitutional:   Alert and oriented. Well appearing and in no distress. Eyes:   No scleral icterus. No conjunctival pallor. PERRL. EOMI ENT   Head:   Normocephalic and atraumatic.   Nose:   No congestion/rhinnorhea. No septal hematoma   Mouth/Throat:   MMM, no pharyngeal erythema. No peritonsillar mass. No uvula shift.   Neck:   No stridor. No SubQ emphysema. No meningismus. Tracheostomy site clean and intact without bleeding or copious secretions. No inflammation. Hematological/Lymphatic/Immunilogical:   No cervical lymphadenopathy. Cardiovascular:   RRR. Normal and symmetric distal pulses are present in all extremities. No murmurs, rubs, or gallops. Respiratory:   Normal respiratory effort without tachypnea nor retractions. Coarse breath sounds diffusely without focal rhonchi or consolidative findings. Gastrointestinal:   Soft and nontender. No distention. There is no CVA tenderness.  No rebound, rigidity, or guarding. Genitourinary:    deferred Musculoskeletal:   Nontender with normal range of motion in all extremities. No joint effusions.  No lower extremity tenderness.  No edema. Neurologic:   Baseline speech and language.  CN 2-10 normal. Motor grossly intact. No gross focal neurologic deficits are appreciated. Patient in neurologic baseline per parents at bedside Skin:    Skin is warm, dry and intact. No rash noted.  No petechiae, purpura, or bullae. Psychiatric:   Mood and affect are normal. Speech and behavior are normal.  ____________________________________________    LABS (pertinent positives/negatives) (all labs ordered are listed, but only abnormal results are displayed) Labs Reviewed  CBC - Abnormal; Notable for the following:    WBC 14.9 (*)    All other components within normal limits  COMPREHENSIVE METABOLIC PANEL - Abnormal; Notable for the following:    Sodium 133 (*)    Chloride 99 (*)    Glucose, Bld 128 (*)    BUN <5 (*)    Creatinine, Ser 0.39 (*)    Total Protein 9.0 (*)    AST 45 (*)    All other components within  normal limits   ____________________________________________   EKG    ____________________________________________    RADIOLOGY  Chest x-ray unremarkable  ____________________________________________   PROCEDURES   ____________________________________________   INITIAL IMPRESSION / ASSESSMENT AND PLAN / ED COURSE  Pertinent labs & imaging results that were available during my care of the patient were reviewed by me and considered in my medical decision making (see chart for details).  Patient presents with increased coughing and increased secretions. X-ray and exam are reassuring. Vital signs are unremarkable, afebrile. His slightly elevated white blood cell count is roughly baseline. He is active and energetic and speaking in the ED and his caregivers are reassured about his current mental state. This appears to be a bronchitis. He does have humidified air  and his ventilator at night, but I encouraged him to also add room humidifier's during the day as we are currently having a change of seasons and home heating systems are starting to be more active.. That we will also be helpful at night for his upper airway. Due to his chronic pulmonary comorbidities, I will start him on Levaquin. His father reports that he tolerates Levaquin just fine if he takes Benadryl with it and that he is not certain that he even has a fluoroquinolone allergy. They have a hospital bed and ventilator at home, respiratory therapy and 16 hour nursing care per day. They're very well-informed and knowledgeable and I think that there is no benefit to keeping him in the hospital. [facility] Low suspicion of PE pneumothorax or sepsis.     ____________________________________________   FINAL CLINICAL IMPRESSION(S) / ED DIAGNOSES  Final diagnoses:  Acute bronchitis, unspecified organism      Sharman CheekPhillip Ercell Perlman, MD 06/06/15 1538

## 2015-06-06 NOTE — Discharge Instructions (Signed)

## 2016-12-09 ENCOUNTER — Inpatient Hospital Stay
Admission: EM | Admit: 2016-12-09 | Discharge: 2016-12-13 | DRG: 853 | Disposition: A | Discharge status: Home / Self Care | Payer: Medicaid Other | Attending: Internal Medicine | Admitting: Internal Medicine

## 2016-12-09 ENCOUNTER — Emergency Department: Payer: Medicaid Other

## 2016-12-09 ENCOUNTER — Encounter: Payer: Self-pay | Admitting: Internal Medicine

## 2016-12-09 DIAGNOSIS — R0600 Dyspnea, unspecified: Secondary | ICD-10-CM

## 2016-12-09 DIAGNOSIS — G8250 Quadriplegia, unspecified: Secondary | ICD-10-CM | POA: Diagnosis present

## 2016-12-09 DIAGNOSIS — Z930 Tracheostomy status: Secondary | ICD-10-CM

## 2016-12-09 DIAGNOSIS — J189 Pneumonia, unspecified organism: Secondary | ICD-10-CM | POA: Diagnosis present

## 2016-12-09 DIAGNOSIS — M419 Scoliosis, unspecified: Secondary | ICD-10-CM | POA: Diagnosis present

## 2016-12-09 DIAGNOSIS — J181 Lobar pneumonia, unspecified organism: Secondary | ICD-10-CM

## 2016-12-09 DIAGNOSIS — Z794 Long term (current) use of insulin: Secondary | ICD-10-CM | POA: Diagnosis not present

## 2016-12-09 DIAGNOSIS — Z881 Allergy status to other antibiotic agents status: Secondary | ICD-10-CM | POA: Diagnosis not present

## 2016-12-09 DIAGNOSIS — T17990A Other foreign object in respiratory tract, part unspecified in causing asphyxiation, initial encounter: Secondary | ICD-10-CM | POA: Diagnosis present

## 2016-12-09 DIAGNOSIS — Z818 Family history of other mental and behavioral disorders: Secondary | ICD-10-CM

## 2016-12-09 DIAGNOSIS — Z888 Allergy status to other drugs, medicaments and biological substances status: Secondary | ICD-10-CM | POA: Diagnosis not present

## 2016-12-09 DIAGNOSIS — Z880 Allergy status to penicillin: Secondary | ICD-10-CM

## 2016-12-09 DIAGNOSIS — E119 Type 2 diabetes mellitus without complications: Secondary | ICD-10-CM | POA: Diagnosis present

## 2016-12-09 DIAGNOSIS — Z833 Family history of diabetes mellitus: Secondary | ICD-10-CM

## 2016-12-09 DIAGNOSIS — Z982 Presence of cerebrospinal fluid drainage device: Secondary | ICD-10-CM

## 2016-12-09 DIAGNOSIS — A419 Sepsis, unspecified organism: Secondary | ICD-10-CM | POA: Diagnosis present

## 2016-12-09 DIAGNOSIS — Z8249 Family history of ischemic heart disease and other diseases of the circulatory system: Secondary | ICD-10-CM

## 2016-12-09 DIAGNOSIS — G40909 Epilepsy, unspecified, not intractable, without status epilepticus: Secondary | ICD-10-CM | POA: Diagnosis present

## 2016-12-09 DIAGNOSIS — Z931 Gastrostomy status: Secondary | ICD-10-CM | POA: Diagnosis not present

## 2016-12-09 DIAGNOSIS — J45909 Unspecified asthma, uncomplicated: Secondary | ICD-10-CM | POA: Diagnosis present

## 2016-12-09 DIAGNOSIS — A4101 Sepsis due to Methicillin susceptible Staphylococcus aureus: Secondary | ICD-10-CM | POA: Diagnosis not present

## 2016-12-09 DIAGNOSIS — J9621 Acute and chronic respiratory failure with hypoxia: Secondary | ICD-10-CM | POA: Diagnosis present

## 2016-12-09 DIAGNOSIS — E871 Hypo-osmolality and hyponatremia: Secondary | ICD-10-CM | POA: Diagnosis present

## 2016-12-09 DIAGNOSIS — E872 Acidosis: Secondary | ICD-10-CM | POA: Diagnosis present

## 2016-12-09 DIAGNOSIS — J9690 Respiratory failure, unspecified, unspecified whether with hypoxia or hypercapnia: Secondary | ICD-10-CM

## 2016-12-09 DIAGNOSIS — J9819 Other pulmonary collapse: Secondary | ICD-10-CM | POA: Diagnosis not present

## 2016-12-09 DIAGNOSIS — R0602 Shortness of breath: Secondary | ICD-10-CM | POA: Diagnosis present

## 2016-12-09 DIAGNOSIS — Z87820 Personal history of traumatic brain injury: Secondary | ICD-10-CM

## 2016-12-09 DIAGNOSIS — J9809 Other diseases of bronchus, not elsewhere classified: Secondary | ICD-10-CM | POA: Diagnosis not present

## 2016-12-09 HISTORY — DX: Quadriplegia, unspecified: G82.50

## 2016-12-09 LAB — CBC WITH DIFFERENTIAL/PLATELET
BASOS ABS: 0 10*3/uL (ref 0–0.1)
Basophils Relative: 0 %
Eosinophils Absolute: 0 10*3/uL (ref 0–0.7)
Eosinophils Relative: 0 %
HEMATOCRIT: 36.1 % — AB (ref 40.0–52.0)
HEMOGLOBIN: 12.9 g/dL — AB (ref 13.0–18.0)
LYMPHS PCT: 7 %
Lymphs Abs: 0.9 10*3/uL — ABNORMAL LOW (ref 1.0–3.6)
MCH: 31.9 pg (ref 26.0–34.0)
MCHC: 35.7 g/dL (ref 32.0–36.0)
MCV: 89.6 fL (ref 80.0–100.0)
Monocytes Absolute: 0.7 10*3/uL (ref 0.2–1.0)
Monocytes Relative: 5 %
NEUTROS ABS: 12.1 10*3/uL — AB (ref 1.4–6.5)
NEUTROS PCT: 88 %
Platelets: 141 10*3/uL — ABNORMAL LOW (ref 150–440)
RBC: 4.03 MIL/uL — AB (ref 4.40–5.90)
RDW: 13.6 % (ref 11.5–14.5)
WBC: 13.7 10*3/uL — AB (ref 3.8–10.6)

## 2016-12-09 LAB — COMPREHENSIVE METABOLIC PANEL
ALK PHOS: 57 U/L (ref 38–126)
ALT: 76 U/L — ABNORMAL HIGH (ref 17–63)
ANION GAP: 10 (ref 5–15)
AST: 45 U/L — ABNORMAL HIGH (ref 15–41)
Albumin: 3.6 g/dL (ref 3.5–5.0)
BILIRUBIN TOTAL: 0.9 mg/dL (ref 0.3–1.2)
BUN: <5 mg/dL — ABNORMAL LOW (ref 6–20)
CALCIUM: 8.9 mg/dL (ref 8.9–10.3)
CO2: 24 mmol/L (ref 22–32)
Chloride: 89 mmol/L — ABNORMAL LOW (ref 101–111)
Creatinine, Ser: 0.38 mg/dL — ABNORMAL LOW (ref 0.61–1.24)
GFR calc non Af Amer: >60 mL/min (ref >60)
Glucose, Bld: 204 mg/dL — ABNORMAL HIGH (ref 65–99)
POTASSIUM: 3.6 mmol/L (ref 3.5–5.1)
Sodium: 123 mmol/L — ABNORMAL LOW (ref 135–145)
TOTAL PROTEIN: 7.7 g/dL (ref 6.5–8.1)

## 2016-12-09 LAB — PROCALCITONIN: Procalcitonin: 0.22 ng/mL

## 2016-12-09 LAB — LACTIC ACID, PLASMA
LACTIC ACID, VENOUS: 3 mmol/L — AB (ref 0.5–1.9)
LACTIC ACID, VENOUS: 2.5 mmol/L — AB (ref 0.5–1.9)
Lactic Acid, Venous: 3.4 mmol/L (ref 0.5–1.9)

## 2016-12-09 LAB — MRSA PCR SCREENING: MRSA by PCR: NEGATIVE

## 2016-12-09 LAB — GLUCOSE, CAPILLARY
GLUCOSE-CAPILLARY: 291 mg/dL — AB (ref 65–99)
Glucose-Capillary: 349 mg/dL — ABNORMAL HIGH (ref 65–99)

## 2016-12-09 MED ORDER — LAMOTRIGINE 25 MG PO TABS
25.0000 mg | ORAL_TABLET | Freq: Every day | ORAL | Status: DC
  Administered 2016-12-10: 25 mg via ORAL
  Filled 2016-12-09: qty 1

## 2016-12-09 MED ORDER — IPRATROPIUM-ALBUTEROL 0.5-2.5 (3) MG/3ML IN SOLN: 3.0000 mL | Freq: Four times a day (QID) | RESPIRATORY_TRACT | Status: DC | PRN

## 2016-12-09 MED ORDER — VALPROATE SODIUM 250 MG/5ML PO SOLN
500.0000 mg | Freq: Three times a day (TID) | ORAL | Status: DC
  Administered 2016-12-09 – 2016-12-11 (×5): 500 mg via ORAL
  Filled 2016-12-09 (×8): qty 10

## 2016-12-09 MED ORDER — PRO-STAT SUGAR FREE PO LIQD
30.0000 mL | Freq: Every day | ORAL | Status: DC
  Administered 2016-12-10 – 2016-12-13 (×4): 30 mL via ORAL

## 2016-12-09 MED ORDER — BISACODYL 10 MG RE SUPP: 10.0000 mg | Freq: Every evening | RECTAL | Status: DC | PRN

## 2016-12-09 MED ORDER — ENOXAPARIN SODIUM 40 MG/0.4ML ~~LOC~~ SOLN
40.0000 mg | SUBCUTANEOUS | Status: DC
  Administered 2016-12-09 – 2016-12-12 (×4): 40 mg via SUBCUTANEOUS
  Filled 2016-12-09 (×5): qty 0.4

## 2016-12-09 MED ORDER — SODIUM CHLORIDE 0.9 % IV BOLUS (SEPSIS)
1000.0000 mL | Freq: Once | INTRAVENOUS | Status: AC
Start: 2016-12-09 — End: 2016-12-09
  Administered 2016-12-09: 1000 mL via INTRAVENOUS

## 2016-12-09 MED ORDER — CLINDAMYCIN HCL 150 MG PO CAPS
300.0000 mg | ORAL_CAPSULE | Freq: Three times a day (TID) | ORAL | Status: DC
  Administered 2016-12-09 – 2016-12-11 (×5): 300 mg via ORAL
  Filled 2016-12-09 (×6): qty 2

## 2016-12-09 MED ORDER — MUPIROCIN 2 % EX OINT
TOPICAL_OINTMENT | Freq: Two times a day (BID) | CUTANEOUS | Status: DC
  Administered 2016-12-11 – 2016-12-13 (×5): via NASAL
  Filled 2016-12-09 (×2): qty 22

## 2016-12-09 MED ORDER — LEVOFLOXACIN IN D5W 500 MG/100ML IV SOLN: 500.0000 mg | INTRAVENOUS | Status: DC

## 2016-12-09 MED ORDER — SODIUM CHLORIDE 0.9 % IV SOLN
INTRAVENOUS | Status: AC
  Administered 2016-12-09: 17:00:00 via INTRAVENOUS
  Administered 2016-12-10: 100 mL/h via INTRAVENOUS
  Administered 2016-12-10: 04:00:00 via INTRAVENOUS

## 2016-12-09 MED ORDER — PSYLLIUM 95 % PO PACK
1.0000 | PACK | Freq: Every day | ORAL | Status: DC
  Administered 2016-12-10: 1 via ORAL
  Filled 2016-12-09: qty 1

## 2016-12-09 MED ORDER — LEVOFLOXACIN IN D5W 500 MG/100ML IV SOLN
500.0000 mg | INTRAVENOUS | Status: DC
  Administered 2016-12-10 – 2016-12-11 (×2): 500 mg via INTRAVENOUS
  Filled 2016-12-09 (×3): qty 100

## 2016-12-09 MED ORDER — CALCIUM CARBONATE ANTACID 1250 MG/5ML PO SUSP
500.0000 mg | Freq: Every day | ORAL | Status: DC
  Filled 2016-12-09: qty 5

## 2016-12-09 MED ORDER — LORATADINE 10 MG PO TABS
10.0000 mg | ORAL_TABLET | Freq: Every day | ORAL | Status: DC
  Administered 2016-12-10: 10 mg via ORAL
  Filled 2016-12-09: qty 1

## 2016-12-09 MED ORDER — LACTULOSE 10 GM/15ML PO SOLN
20.0000 g | Freq: Two times a day (BID) | ORAL | Status: DC
  Administered 2016-12-09 – 2016-12-13 (×8): 20 g via ORAL
  Filled 2016-12-09 (×8): qty 30

## 2016-12-09 MED ORDER — CALCIUM CARBONATE ANTACID 500 MG PO CHEW
2.5000 | CHEWABLE_TABLET | Freq: Every day | ORAL | Status: DC
  Administered 2016-12-10 – 2016-12-13 (×4): 500 mg via ORAL
  Filled 2016-12-09: qty 1
  Filled 2016-12-09: qty 2
  Filled 2016-12-09 (×3): qty 1

## 2016-12-09 MED ORDER — INSULIN ASPART 100 UNIT/ML ~~LOC~~ SOLN
0.0000 [IU] | Freq: Every day | SUBCUTANEOUS | Status: DC
  Administered 2016-12-09: 4 [IU] via SUBCUTANEOUS
  Administered 2016-12-10 – 2016-12-11 (×2): 5 [IU] via SUBCUTANEOUS
  Filled 2016-12-09 (×2): qty 5

## 2016-12-09 MED ORDER — LEVETIRACETAM 100 MG/ML PO SOLN
2000.0000 mg | Freq: Two times a day (BID) | ORAL | Status: DC
  Administered 2016-12-09 – 2016-12-13 (×8): 2000 mg
  Filled 2016-12-09 (×10): qty 20

## 2016-12-09 MED ORDER — BUDESONIDE 0.5 MG/2ML IN SUSP
0.5000 mg | Freq: Two times a day (BID) | RESPIRATORY_TRACT | Status: DC
  Administered 2016-12-10 – 2016-12-13 (×7): 0.5 mg via RESPIRATORY_TRACT
  Filled 2016-12-09 (×7): qty 2

## 2016-12-09 MED ORDER — ACETAMINOPHEN 650 MG RE SUPP
650.0000 mg | Freq: Four times a day (QID) | RECTAL | Status: DC | PRN
Start: 2016-12-09 — End: 2016-12-13

## 2016-12-09 MED ORDER — BACLOFEN 10 MG PO TABS
10.0000 mg | ORAL_TABLET | Freq: Two times a day (BID) | ORAL | Status: DC
  Administered 2016-12-09 – 2016-12-10 (×3): 10 mg via ORAL
  Filled 2016-12-09 (×3): qty 1

## 2016-12-09 MED ORDER — ZINC OXIDE 40 % EX OINT
1.0000 "application " | TOPICAL_OINTMENT | Freq: Two times a day (BID) | CUTANEOUS | Status: DC | PRN
  Filled 2016-12-09: qty 57

## 2016-12-09 MED ORDER — ACETAMINOPHEN 160 MG/5ML PO SOLN
1000.0000 mg | Freq: Once | ORAL | Status: DC
  Filled 2016-12-09: qty 40

## 2016-12-09 MED ORDER — INSULIN DETEMIR 100 UNIT/ML ~~LOC~~ SOLN
45.0000 [IU] | Freq: Every day | SUBCUTANEOUS | Status: DC
  Administered 2016-12-09 – 2016-12-12 (×4): 45 [IU] via SUBCUTANEOUS
  Filled 2016-12-09 (×5): qty 0.45

## 2016-12-09 MED ORDER — INULIN 2 G PO CHEW: 2.0000 | CHEWABLE_TABLET | Freq: Every day | ORAL | Status: DC

## 2016-12-09 MED ORDER — SODIUM CHLORIDE 0.9 % IV BOLUS (SEPSIS)
1400.0000 mL | Freq: Once | INTRAVENOUS | Status: AC
  Administered 2016-12-09: 1400 mL via INTRAVENOUS

## 2016-12-09 MED ORDER — ACETAMINOPHEN 650 MG/20.3ML PO SOLN
1000.0000 mg | Freq: Once | ORAL | Status: AC
  Administered 2016-12-09: 1000 mg via ORAL
  Filled 2016-12-09 (×2): qty 1000

## 2016-12-09 MED ORDER — NYSTATIN 100000 UNIT/GM EX POWD
1.0000 g | Freq: Two times a day (BID) | CUTANEOUS | Status: DC | PRN
  Filled 2016-12-09: qty 15

## 2016-12-09 MED ORDER — GUAIFENESIN ER 600 MG PO TB12
600.0000 mg | ORAL_TABLET | Freq: Two times a day (BID) | ORAL | Status: DC
  Administered 2016-12-09 – 2016-12-10 (×3): 600 mg via ORAL
  Filled 2016-12-09 (×3): qty 1

## 2016-12-09 MED ORDER — MUPIROCIN CALCIUM 2 % EX CREA
1.0000 "application " | TOPICAL_CREAM | Freq: Two times a day (BID) | CUTANEOUS | Status: DC
  Filled 2016-12-09: qty 15

## 2016-12-09 MED ORDER — LEVOFLOXACIN IN D5W 750 MG/150ML IV SOLN
750.0000 mg | Freq: Once | INTRAVENOUS | Status: AC
  Administered 2016-12-09: 750 mg via INTRAVENOUS
  Filled 2016-12-09: qty 150

## 2016-12-09 MED ORDER — RANITIDINE HCL 150 MG/10ML PO SYRP
150.0000 mg | ORAL_SOLUTION | Freq: Two times a day (BID) | ORAL | Status: DC
  Administered 2016-12-09 – 2016-12-13 (×8): 150 mg via ORAL
  Filled 2016-12-09 (×9): qty 10

## 2016-12-09 MED ORDER — ACETAMINOPHEN 325 MG PO TABS
650.0000 mg | ORAL_TABLET | Freq: Four times a day (QID) | ORAL | Status: DC | PRN
  Administered 2016-12-11 – 2016-12-12 (×2): 650 mg via ORAL
  Filled 2016-12-09 (×2): qty 2

## 2016-12-09 MED ORDER — CLONAZEPAM 1 MG PO TABS: 1.0000 mg | ORAL_TABLET | ORAL | Status: DC | PRN

## 2016-12-09 MED ORDER — ONDANSETRON HCL 4 MG/2ML IJ SOLN: 4.0000 mg | Freq: Four times a day (QID) | INTRAMUSCULAR | Status: DC | PRN

## 2016-12-09 MED ORDER — VITAMINS A & D EX OINT
1.0000 "application " | TOPICAL_OINTMENT | CUTANEOUS | Status: DC | PRN
  Filled 2016-12-09: qty 1

## 2016-12-09 MED ORDER — INSULIN ASPART 100 UNIT/ML ~~LOC~~ SOLN
0.0000 [IU] | Freq: Three times a day (TID) | SUBCUTANEOUS | Status: DC
  Administered 2016-12-09 – 2016-12-10 (×2): 5 [IU] via SUBCUTANEOUS
  Administered 2016-12-10: 2 [IU] via SUBCUTANEOUS
  Administered 2016-12-10: 7 [IU] via SUBCUTANEOUS
  Administered 2016-12-11: 9 [IU] via SUBCUTANEOUS
  Administered 2016-12-11: 7 [IU] via SUBCUTANEOUS
  Administered 2016-12-11: 2 [IU] via SUBCUTANEOUS
  Administered 2016-12-12: 7 [IU] via SUBCUTANEOUS
  Filled 2016-12-09: qty 7
  Filled 2016-12-09: qty 2
  Filled 2016-12-09: qty 4
  Filled 2016-12-09: qty 9
  Filled 2016-12-09: qty 7
  Filled 2016-12-09: qty 5
  Filled 2016-12-09: qty 2
  Filled 2016-12-09: qty 5
  Filled 2016-12-09: qty 7

## 2016-12-09 MED ORDER — ONDANSETRON HCL 4 MG PO TABS: 4.0000 mg | ORAL_TABLET | Freq: Four times a day (QID) | ORAL | Status: DC | PRN

## 2016-12-09 MED ORDER — VITAMIN C 500 MG PO TABS
1000.0000 mg | ORAL_TABLET | Freq: Every day | ORAL | Status: DC
  Administered 2016-12-10: 1000 mg via ORAL
  Filled 2016-12-09: qty 2

## 2016-12-09 NOTE — Progress Notes (Signed)
Patient in no distress. Just received feeding from his mom. Requires some suctioning of airway post feeding. BBS coarse with some rhonci. SXN x3 for clear to white secretions. Saturation noted 94-95% Speaking valve reapplied post sxn. Patient tolerated well. Mom at bedside

## 2016-12-09 NOTE — Progress Notes (Signed)
eLink Physician-Brief Progress Note Patient Name: Roger HotterJoshua Tate DOB: Feb 21, 1991 MRN: [id number]   Date of Service  12/09/2016  HPI/Events of Note  Patient on nocturnal mechanical ventilation at home.  eICU Interventions  Will order: 1. Nocturnal Ventilator settings: 50%/PRVC 14/TV 8 mL/kg/P 5. Titrate FiO2 to keep sat > 92%.     Intervention Category Major Interventions: Respiratory failure - evaluation and management  Yobana Culliton Eugene 12/09/2016, 6:56 PM

## 2016-12-09 NOTE — ED Triage Notes (Signed)
Pt presents via EMS with c/o SOB and episode of hypoxia [facility] States 02 saturation dropped to 89% at home. Pt hx TBI with chronic trach, bed bound. Saturation 92% ra currently. Mother and HH RN at bedside.

## 2016-12-09 NOTE — Progress Notes (Signed)
ANTIBIOTIC CONSULT NOTE - INITIAL  Pharmacy Consult for Levaquin  Indication: pneumonia  Allergies  Allergen Reactions  . Sulfa Antibiotics Anaphylaxis  . Zosyn [Piperacillin-Tazobactam In Dex] Anaphylaxis  . Ciprofloxacin Other (See Comments)    HYPOTENSION  . Lorazepam Other (See Comments)    Per pt's father. Reaction unknown. Diazepam and clonazepam OK  . Vancomycin   . Penicillins Rash    Patient Measurements: Height: 5\' 4"  (162.6 cm) Weight: 161 lb 9.6 oz (73.3 kg) IBW/kg (Calculated) : 59.2 Adjusted Body Weight:   Vital Signs: Temp: 99.1 F (37.3 C) (06/02 1651) Temp Source: Oral (06/02 1651) BP: 100/48 (06/02 1700) Pulse Rate: 103 (06/02 1700) Intake/Output from previous day: No intake/output data recorded. Intake/Output from this shift: No intake/output data recorded.  Labs:  Recent Labs  12/09/16 1229  WBC 13.7*  HGB 12.9*  PLT 141*  CREATININE 0.38*   Estimated Creatinine Clearance: 130.5 mL/min (A) (by C-G formula based on SCr of 0.38 mg/dL (L)). No results for input(s): VANCOTROUGH, VANCOPEAK, VANCORANDOM, GENTTROUGH, GENTPEAK, GENTRANDOM, TOBRATROUGH, TOBRAPEAK, TOBRARND, AMIKACINPEAK, AMIKACINTROU, AMIKACIN in the last 72 hours.   Microbiology: No results found for this or any previous visit (from the past 720 hour(s)).  Medical History: Past Medical History:  Diagnosis Date  . Asthma   . Brain injury (HCC) 2002  . Diabetes mellitus without complication (HCC)   . Quadriplegia (HCC)   . Seizures (HCC)     Medications:  Prescriptions Prior to Admission  Medication Sig Dispense Refill Last Dose  . acetaminophen (TYLENOL) 160 MG/5ML elixir Take 640 mg by mouth every 4 (four) hours as needed for fever.   prn at prn  . albuterol (PROVENTIL) (2.5 MG/3ML) 0.083% nebulizer solution Inhale 3 mLs into the lungs 4 (four) times daily as needed.   prn at prn  . Ascorbic Acid 500 MG CHEW Chew 1,000 mg by mouth daily.    12/08/2016 at 1200  . baclofen  (LIORESAL) 10 MG tablet Take 1 tablet by mouth 2 (two) times daily.   12/09/2016 at 0300  . bisacodyl (LAXATIVE) 10 MG suppository Place 1 suppository rectally at bedtime as needed for mild constipation, moderate constipation or severe constipation.    prn at prn  . brompheniramine-pseudoephedrine-dextromethorphan (DIMETAPP DM) 15-1-5 MG/5ML ELIX Take 10 mLs by mouth every 6 (six) hours as needed.   prn at prn  . Calcium Carbonate Antacid (CALCIUM CARBONATE, DOSED IN MG ELEMENTAL CALCIUM,) 1250 MG/5ML SUSP Take 500 mg of elemental calcium by mouth daily.    12/08/2016 at 1400  . cetirizine (ZYRTEC) 10 MG tablet Take 10 mg by mouth daily.    12/09/2016 at 0900  . clonazePAM (KLONOPIN) 1 MG tablet Take 1 mg by mouth as needed for anxiety (for 3 or more seizures within an hour.).    prn at prn  . diphenhydrAMINE (BENADRYL) 25 mg capsule Take 25 mg by mouth daily as needed (to pretreat antibiotics.).    prn at prn  . EPINEPHrine 0.3 mg/0.3 mL IJ SOAJ injection Inject 0.3 mg into the muscle as needed.    prn at prn  . glucagon (GLUCAGON EMERGENCY) 1 MG injection Inject 1 mg into the muscle as needed.   prn at prn  . guaiFENesin (ROBITUSSIN) 100 MG/5ML SOLN Take 5 mLs by mouth every 4 (four) hours as needed for cough or to loosen phlegm.   prn at prn   . insulin aspart (NOVOLOG) 100 UNIT/ML injection Inject 8 Units into the skin as needed for high  blood sugar. Prn before meals for blood glucose greater than 200    12/09/2016 at 0900  . insulin detemir (LEVEMIR) 100 UNIT/ML injection Inject 45 Units into the skin at bedtime.   12/08/2016 at qhs  . Inulin (FIBERCHOICE) 2 g CHEW Chew 2 tablets by mouth daily.   12/08/2016 at 1200  . lactulose (CHRONULAC) 10 GM/15ML solution Take 30 mLs by mouth 2 (two) times daily.   12/09/2016 at 0300  . lamoTRIgine (LAMICTAL) 25 MG tablet Take 25 mg by mouth daily. Pt is tapering up starting at 25mg  for 2 weeks, then pt will take 1 tab bid for 2 weeks, then pt will take 1 tab qam and 2  tabs qpm for 2 weeks, then 2 tabs bid for 2 weeks, then 50mg  qam and 75 mg qpm for 2 weeks, then 75mg  bid for 2 weeks, then 100mg  qam and 75 qpm for 2 weeks, then 100mg  bid for 2 weeks, then 125mg  qam and 100mg  qpm for 2 weeks, then 125mg  bid   12/08/2016 at Unknown time  . levETIRAcetam (KEPPRA) 100 MG/ML solution Place 2,000 mg into feeding tube 2 (two) times daily.   0 12/09/2016 at 0300  . mupirocin cream (BACTROBAN) 2 % Apply 1 application topically as needed.   prn at prn  . nystatin (NYSTATIN) powder Apply 1 g topically 2 (two) times daily as needed.   prn at prn  . PEDIATRIC MULTIPLE VITAMINS PO Take 2 tablets by mouth daily.   12/08/2016 at 1200  . PULMICORT 0.5 MG/2ML nebulizer solution Inhale 1 vial into the lungs 2 (two) times daily.  3 12/08/2016 at 2300  . ranitidine (ZANTAC) 150 MG/10ML syrup Take 10 mLs by mouth 2 (two) times daily.  4 12/09/2016 at 0300  . sodium chloride irrigation 0.9 % irrigation Irrigate with as directed as needed.   prn at prn  . triamcinolone lotion (KENALOG) 0.1 % Apply 1 application topically 2 (two) times daily as needed.   prn at prn  . Valproic Acid (DEPAKENE) 250 MG/5ML SYRP syrup Take 10 mLs by mouth every 8 (eight) hours.   4 12/09/2016 at 0600  . Vitamins A & D (VITAMIN A & D) ointment Apply 1 application topically as needed for dry skin.   prn at prn  . zinc oxide 20 % ointment Apply 1 application topically 2 (two) times daily as needed for irritation.   prn at prn  . Amino Acids-Protein Hydrolys (FEEDING SUPPLEMENT, PRO-STAT SUGAR FREE 64,) LIQD Take 30 mLs by mouth daily. 900 mL 0    Assessment: CrCl = 130.5 ml/min   Goal of Therapy:  resolution of infection  Plan:  Expected duration 7 days with resolution of temperature and/or normalization of WBC   Levaquin 750 mg IV X 1 given on 6/2 @ 12:50.  Will continue with levaquin 500 mg IV Q24H to start on 6/3 @ 10:00.   Roger Tate D 12/09/2016,5:54 PM

## 2016-12-09 NOTE — ED Provider Notes (Signed)
[facility] Emergency Department Provider Note  ____________________________________________   First MD Initiated Contact with Patient 12/09/16 1102     (approximate)  I have reviewed the triage vital signs and the nursing notes.   HISTORY  Chief Complaint Shortness of Breath  Level V exemption history Limited by the patient's traumatic brain injury  HPI Roger Tate is a 26 y.o. male who comes to the emergency department via EMS with cough and hypoxia. 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 According to EMS the patient normally has saturations in the mid 80s however this morning he had a coughing fit in his sat dropped to the low 80s. Mom suctioned his trachea and he perked back up but she called 911 to make sure everything was okay. He is currently taking antibiotics for her pneumonia.  ----------------------------------------- 11:43 AM on 12/09/2016 -----------------------------------------  Further collateral history provided by the patient's stepmother and caretaker at bedside. They report that the patient has not in fact been sick recently and his last pneumonia was several years ago. He spiked a fever at home to 101 yesterday associated with a cough. He had a brief desaturation down to the high 70s. His secretions or thicker and more green than they usually are. His normal oxygen saturation is anywhere between 88 and 100%.   Past Medical History:  Diagnosis Date  . Asthma   . Brain injury (HCC) 2002  . Diabetes mellitus without complication (HCC)   . Seizures [facility])     Patient Active Problem List   Diagnosis Date Noted  . Lung collapse   . Bacteremia 03/07/2015  . Acute on chronic respiratory failure (HCC) 01/14/2015  . Left lower lobe pneumonia (HCC) 01/14/2015  . Hypotension 01/14/2015  . Sepsis (HCC) 01/14/2015  . S/P ventriculoperitoneal shunt 01/14/2015  . H/O tracheostomy  01/14/2015  . S/P percutaneous endoscopic gastrostomy (PEG) tube placement (HCC) 01/14/2015  . Scoliosis 01/14/2015  . 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 BRAIN SURGERY  2002  . CSF SHUNT    . SP PERC PLACE GASTRIC TUBE    . TRACHEOSTOMY TUBE PLACEMENT      Prior to Admission medications   Medication Sig Start Date End Date Taking? Authorizing Provider  albuterol (PROVENTIL) (2.5 MG/3ML) 0.083% nebulizer solution Inhale 3 mLs into the lungs 4 (four) times daily as needed. 09/15/14   [provider]  Amino Acids-Protein Hydrolys (FEEDING SUPPLEMENT, PRO-STAT SUGAR FREE 64,) LIQD Take 30 mLs by mouth daily. 03/18/15   Altamese Dilling, MD  ascorbic acid (VITAMIN C) 250 MG CHEW Chew 250 mg by mouth daily.    [provider]  baclofen (LIORESAL) 10 MG tablet Take 1 tablet by mouth 2 (two) times daily.    [provider]  bisacodyl (LAXATIVE) 10 MG suppository Place 1 suppository rectally at bedtime as needed for mild constipation, moderate constipation or severe constipation.     [provider]  calcium carbonate (TUMS EX) 750 MG chewable tablet Chew 1 tablet by mouth daily.    [provider]  cetirizine (ZYRTEC) 10 MG tablet Take 10 mg by mouth daily as needed for allergies or rhinitis.     [provider]  clonazePAM (KLONOPIN) 1 MG tablet Take 1 mg by mouth as needed for anxiety (for 3 or more seizures within an hour.).     [provider]  Dextromethorphan-Guaifenesin (GUAIFENESIN DM) 400-20 MG TABS Take 1 tablet by mouth every 12 (twelve) hours.    [provider]  diphenhydrAMINE (BENADRYL) 25 mg capsule Take 25 mg by mouth daily as needed (to pretreat antibiotics.).     [provider]  EPINEPHrine (EPIPEN JR) 0.15 MG/0.3ML injection Inject 0.15 mg into the muscle as needed.    [provider]  FIBER PO Take 2 tablets by mouth daily.      [provider]  glucagon (GLUCAGON EMERGENCY) 1 MG injection Inject 1 mg into the muscle as needed.    [provider]  insulin aspart (NOVOLOG) 100 UNIT/ML injection Inject 2 Units into the skin as needed for high blood sugar. Prn before meals for blood glucose greater than 200    [provider]  ketoconazole (NIZORAL) 2 % cream Apply 1 application topically 2 (two) times daily as needed for irritation.     [provider]  lactulose (CHRONULAC) 10 GM/15ML solution Take 30 mLs by mouth 2 (two) times daily.    [provider]  levETIRAcetam (KEPPRA) 100 MG/ML solution Place 2,250 mg into feeding tube 2 (two) times daily. 01/19/15   [provider]  Multiple Vitamin (MULTI-VITAMINS PO) Take 2 each by mouth daily. Take gummies.    [provider]  nystatin cream (MYCOSTATIN) Apply 1 application topically 3 (three) times daily as needed. For rash. 01/28/15   [provider]  PULMICORT 0.5 MG/2ML nebulizer solution Inhale 1 vial into the lungs 2 (two) times daily. 02/08/15   [provider]  ranitidine (ZANTAC) 75 MG/5ML syrup Take 10 mLs by mouth 2 (two) times daily. 12/21/14   [provider]  Valproic Acid (DEPAKENE) 250 MG/5ML SYRP syrup Take 10-15 mLs by mouth See admin instructions.  Take 10 mls orally twice daily (5am, 3pm) and 15 mls orally nightly at 11pm. 01/28/15   [provider]    Allergies Sulfa antibiotics; Zosyn [piperacillin-tazobactam in dex]; Ciprofloxacin; Lorazepam; Vancomycin; and Penicillins  Family History  Problem Relation Age of Onset  . Bipolar disorder Mother   . Diabetes Father     Social History Social History  Substance Use Topics  . Smoking status: Never Smoker  . Smokeless tobacco: Not on file  . Alcohol use No    Review of Systems Level V exemption history Limited by the patient's clinical condition and chronic  disease  ____________________________________________   PHYSICAL EXAM:  VITAL SIGNS: ED Triage Vitals  Enc Vitals Group     BP      Pulse      Resp      Temp      Temp src      SpO2      Weight      Height      Head Circumference      Peak Flow      Pain Score      Pain Loc      Pain Edu?      Excl. in GC?     Constitutional: Trach dependent contorted and diaphoretic with elevated respiratory rate Eyes: PERRL EOMI. Head: Atraumatic. Nose: No congestion/rhinnorhea. Mouth/Throat: No trismusTrach in place with no discharge Neck: No stridor.   Cardiovascular: Tachycardic rate, regular rhythm. Grossly normal heart sounds.  Good peripheral circulation. Respiratory: Increased respiratory effort coarse breath sounds throughout Gastrointestinal: Soft nontender feeding tube in place Musculoskeletal: No lower extremity edema   Neurologic: No gross focal neurologic deficits are appreciated. Skin:  Diaphoretic  ____________________________________________   DIFFERENTIAL  Pneumonia, pneumothorax, tracheitis, aspiration ____________________________________________   LABS (all labs ordered are listed, but only abnormal results are displayed)  Labs Reviewed  LACTIC ACID, PLASMA - Abnormal; Notable for the following:       Result Value   Lactic Acid, Venous 3.4 (*)    All other components within normal limits  COMPREHENSIVE METABOLIC PANEL - Abnormal; Notable for the following:    Sodium 123 (*)    Chloride 89 (*)    Glucose, Bld 204 (*)    BUN <5 (*)    Creatinine, Ser 0.38 (*)    AST 45 (*)    ALT 76 (*)    All other components within normal limits  CBC WITH DIFFERENTIAL/PLATELET - Abnormal; Notable for the following:    WBC 13.7 (*)    RBC 4.03 (*)    Hemoglobin 12.9 (*)    HCT 36.1 (*)    Platelets 141 (*)    Neutro Abs 12.1 (*)    Lymphs Abs 0.9 (*)    All other components within normal limits  CULTURE, BLOOD (ROUTINE X 2)  CULTURE, BLOOD (ROUTINE X  2)  CULTURE, RESPIRATORY (NON-EXPECTORATED)  LACTIC ACID, PLASMA  PROCALCITONIN    Lactate of 3.4 consistent with severe sepsis elevated white count suggestive of bacterial source __________________________________________  EKG   ____________________________________________  RADIOLOGY  Chest x-ray with left lower lobe pneumonia ____________________________________________   PROCEDURES  Procedure(s) performed: no  Procedures  Critical Care performed: yes  CRITICAL CARE Performed by: Merrily Brittle   Total critical care time: 35 minutes  Critical care time was exclusive of separately billable procedures and treating other patients.  Critical care was necessary to treat or prevent imminent or life-threatening deterioration.  Critical care was time spent personally by me on the following activities: development of treatment plan with patient and/or surrogate as well as nursing, discussions with consultants, evaluation of patient's response to treatment, examination of patient, obtaining history from patient or surrogate, ordering and performing treatments and interventions, ordering and review of laboratory studies, ordering and review of radiographic studies, pulse oximetry and re-evaluation of patient's condition.   Observation: no ____________________________________________   INITIAL IMPRESSION / ASSESSMENT AND PLAN / ED COURSE  Pertinent labs & imaging results that were available during my care of the patient were reviewed by me and considered in my medical decision making (see chart for details).  On arrival the patient is febrile diaphoretic to An tachycardic. I'm most concerned about pneumonia and a chest x-ray confirms left lower lobe. Stepmom at bedside has asked me to do everything I can tell the patient to be treated as an outpatient. He has multiple allergies to antibiotics but is able to take Levaquin. We will start with Levaquin as well as a liter of fluid  now and reevaluate.  The patient's lactate came back at 3.4 which is concerning for severe sepsis. All cultures have been drawn but at this point he requires inpatient admission for IV antibiotics and 30 cc/kg of IV fluids.      ____________________________________________   FINAL CLINICAL IMPRESSION(S) / ED DIAGNOSES  Final diagnoses:  Community acquired pneumonia of left lower lobe of lung (HCC)  Sepsis, due to unspecified organism [facility])      NEW MEDICATIONS STARTED DURING THIS VISIT:  New Prescriptions   No medications on file     Note:  This document was prepared using Dragon voice recognition software and may include unintentional dictation errors.  Merrily Brittleifenbark, Jadalee Westcott, MD 12/09/16 1335

## 2016-12-09 NOTE — Progress Notes (Signed)
eLink Physician-Brief Progress Note Patient Name: Genia HotterJoshua Shiflet DOB: 1990-07-21 MRN: [id number]   Date of Service  12/09/2016  HPI/Events of Note  26 yo male with PMH of Quadriplegia, TBI and VDRF s/p trach. Admitted with CAP and sepsis. VSS. Needs repeat Lactic Acid levels for Sepsis Bundle follow up.  eICU Interventions  Will order: 1. Lactic Acid level now and Q 6 hours X 2.      Intervention Category Evaluation Type: New Patient Evaluation  Lenell AntuSommer,Jillaine Waren Eugene 12/09/2016, 5:10 PM

## 2016-12-09 NOTE — ED Notes (Signed)
Called code sepsis

## 2016-12-09 NOTE — Progress Notes (Signed)
Placed patient on vent.. See flowsheet for settings. 5 ml of saline added to cuff for inflation. Per Mom, 4 to 5 mls is what is added at home when patient is placed on vent. Patient tolerated well. Answers questions appropriately. BBS diminished on right/rhonci on left. Suction applied. Vitals stable. Will continue to monitor.

## 2016-12-09 NOTE — Consult Note (Signed)
PULMONARY / CRITICAL CARE MEDICINE   Name: Roger Tate MRN: [id number] DOB: Nov 02, 1990    ADMISSION DATE:  12/09/2016   CONSULTATION DATE:  12/09/2016  REFERRING MD:  Dr. Nemiah Tate  REASON: ICU management of Pneumonia  CHIEF COMPLAINT:  Shortness of breath  HISTORY OF PRESENT ILLNESS:   This is a 26 y/o WM  With a history of asthma, traumatic brain injury, typ 2 diabetes, quadriplegia and seizures who presented to the ED via EMS with worsening dyspnea and hypoxia. SPO2 on arrival was in the 80s. Patient was being treated for pneumonia. Chest x-ray suggested left lower lobe pneumonia. He is being admitted to the ICU for chronic vent management and CAP  PAST MEDICAL HISTORY :  He  has a past medical history of Asthma; Brain injury (HCC) (2002); Diabetes mellitus without complication (HCC); Quadriplegia (HCC); and Seizures (HCC).  PAST SURGICAL HISTORY: He  has a past surgical history that includes Brain surgery (2002); Tracheostomy tube placement; SP PERC PLACE GASTRIC TUBE; and CSF shunt.  Allergies  Allergen Reactions  . Sulfa Antibiotics Anaphylaxis  . Zosyn [Piperacillin-Tazobactam In Dex] Anaphylaxis  . Ciprofloxacin Other (See Comments)    HYPOTENSION  . Lorazepam Other (See Comments)    Per pt's father. Reaction unknown. Diazepam and clonazepam OK  . Vancomycin   . Penicillins Rash    No current facility-administered medications on file prior to encounter.    Current Outpatient Prescriptions on File Prior to Encounter  Medication Sig  . albuterol (PROVENTIL) (2.5 MG/3ML) 0.083% nebulizer solution Inhale 3 mLs into the lungs 4 (four) times daily as needed.  . Ascorbic Acid 500 MG CHEW Chew 1,000 mg by mouth daily.   . baclofen (LIORESAL) 10 MG tablet Take 1 tablet by mouth 2 (two) times daily.  . bisacodyl (LAXATIVE) 10 MG suppository Place 1 suppository rectally at bedtime as needed for mild constipation, moderate constipation or severe constipation.   . Calcium  Carbonate Antacid (CALCIUM CARBONATE, DOSED IN MG ELEMENTAL CALCIUM,) 1250 MG/5ML SUSP Take 500 mg of elemental calcium by mouth daily.   . cetirizine (ZYRTEC) 10 MG tablet Take 10 mg by mouth daily.   . clonazePAM (KLONOPIN) 1 MG tablet Take 1 mg by mouth as needed for anxiety (for 3 or more seizures within an hour.).   Marland Kitchen. diphenhydrAMINE (BENADRYL) 25 mg capsule Take 25 mg by mouth daily as needed (to pretreat antibiotics.).   Marland Kitchen. EPINEPHrine 0.3 mg/0.3 mL IJ SOAJ injection Inject 0.3 mg into the muscle as needed.   Marland Kitchen. glucagon (GLUCAGON EMERGENCY) 1 MG injection Inject 1 mg into the muscle as needed.  . insulin aspart (NOVOLOG) 100 UNIT/ML injection Inject 8 Units into the skin as needed for high blood sugar. Prn before meals for blood glucose greater than 200   . lactulose (CHRONULAC) 10 GM/15ML solution Take 30 mLs by mouth 2 (two) times daily.  Marland Kitchen. levETIRAcetam (KEPPRA) 100 MG/ML solution Place 2,000 mg into feeding tube 2 (two) times daily.   Marland Kitchen. PULMICORT 0.5 MG/2ML nebulizer solution Inhale 1 vial into the lungs 2 (two) times daily.  . ranitidine (ZANTAC) 150 MG/10ML syrup Take 10 mLs by mouth 2 (two) times daily.  . Valproic Acid (DEPAKENE) 250 MG/5ML SYRP syrup Take 10 mLs by mouth every 8 (eight) hours.   . Amino Acids-Protein Hydrolys (FEEDING SUPPLEMENT, PRO-STAT SUGAR FREE 64,) LIQD Take 30 mLs by mouth daily.    FAMILY HISTORY:  His indicated that his mother is alive. He indicated that his father is  alive.    SOCIAL HISTORY: He  reports that he has never smoked. He has never used smokeless tobacco. He reports that he does not drink alcohol or use drugs.  REVIEW OF SYSTEMS:    UNABLE TO OBTAIN DUE TO PATIENT'S CONDITION  SUBJECTIVE:   VITAL SIGNS: BP (!) 105/51   Pulse 94   Temp 99.1 F (37.3 C) (Oral)   Resp (!) 21   Ht 5\' 4"  (1.626 m)   Wt 161 lb 9.6 oz (73.3 kg)   SpO2 94%   BMI 27.74 kg/m   HEMODYNAMICS:    VENTILATOR SETTINGS: FiO2 (%):  [35 %-45 %] 45  %  INTAKE / OUTPUT: No intake/output data recorded.  PHYSICAL EXAMINATION: General:  No acute distress , awake Neuro:   Alert, follows basic comments , quadriplegic HEENT:  PERRLA,  tracheostomy Cardiovascular: RRR, S1/S2, no MRG Lungs: Normal WOB, + breath sounds, + rhonchi, no wheezing Abdomen: Peg tube, normal bowel sounds Musculoskeletal:  Contractures in BLLE Skin: warm and dry  LABS:  BMET  Recent Labs Lab 12/09/16 1229  NA 123*  K 3.6  CL 89*  CO2 24  BUN <5*  CREATININE 0.38*  GLUCOSE 204*    Electrolytes  Recent Labs Lab 12/09/16 1229  CALCIUM 8.9    CBC  Recent Labs Lab 12/09/16 1229  WBC 13.7*  HGB 12.9*  HCT 36.1*  PLT 141*    Coag's No results for input(s): APTT, INR in the last 168 hours.  Sepsis Markers  Recent Labs Lab 12/09/16 1229 12/09/16 1813  LATICACIDVEN 3.4* 3.0*  PROCALCITON 0.22  --     ABG No results for input(s): PHART, PCO2ART, PO2ART in the last 168 hours.  Liver Enzymes  Recent Labs Lab 12/09/16 1229  AST 45*  ALT 76*  ALKPHOS 57  BILITOT 0.9  ALBUMIN 3.6    Cardiac Enzymes No results for input(s): TROPONINI, PROBNP in the last 168 hours.  Glucose  Recent Labs Lab 12/09/16 1656  GLUCAP 291*    Imaging Dg Chest Port 1 View  Result Date: 12/09/2016 CLINICAL DATA:  Fever and hypoxia. EXAM: PORTABLE CHEST 1 VIEW COMPARISON:  06/05/2015 and prior radiographs FINDINGS: A tracheostomy tube again noted on this rotated study. Probable left lower lung airspace disease noted. There is no evidence of pneumothorax or pleural effusion. A catheter overlying the right chest is noted. IMPRESSION: Probable left lower lung airspace disease/pneumonia. Electronically Signed   By: Roger Tate M.D.   On: 12/09/2016 11:27    STUDIES:  none  CULTURES: Blood cultures x 2  ANTIBIOTICS: Levaquin  Clindamycin  SIGNIFICANT EVENTS: 06/02>admitted with LLL pneumonia  LINES/TUBES: Peg tube Tracheostomy  tube  DISCUSSION: 26 y/o WM with chronic respiratory failure on nocturnal vent support presenting with CAP and acute on chronic respiratory failure  ASSESSMENT  CAP Acute on chronic hypoxemic respiratory failure Lactic acidosis Chronic hyponatremia H/o T2DM H/O Asthma H/O Seizures H/O TBI with quadriplegia  PLAN Nocturnal vent support VAP protocol Nebulized bronchodilator Procalcitonin protocol ABG and CXR prn IV fluids Resume all home medications Blood glucose monitoring with SSI coverage   FAMILY  - Updates: Mother updated at bedside.   - Inter-disciplinary family meet or Palliative Care meeting due by:  day 7   Magdalene S. [facility] ANP-BC Pulmonary and Critical Care Medicine [facility] Pager [phone] or [phone]  12/09/2016, 7:35 PM

## 2016-12-09 NOTE — H&P (Signed)
[facility] [place] [facility]   PATIENT NAME: Roger Tate    MR#:  [id number]  DATE OF BIRTH:  Jun 13, 1991  DATE OF ADMISSION:  12/09/2016  PRIMARY CARE PHYSICIAN: Judeen HammansSoles, Meredith Key, MD   REQUESTING/REFERRING PHYSICIAN:  Dr. Merrily BrittleNeil Rifenbark  CHIEF COMPLAINT:   Chief Complaint  Patient presents with  . Shortness of Breath    HISTORY OF PRESENT ILLNESS:  Roger Tate  is a 26 y.o. 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 Most of the history is obtained from patient's mother at bedside. According to her, patient was noted to have increased secretions which were thick and green colored yesterday. He spiked a fever last night. Usually at baseline during the daytime patient does not use oxygen and his trach is. However at nighttime to use supplemental oxygen during sleeping. Last night his oxygen saturations dropped down to 70% even with the support of oxygen. His fevers were not coming down and he was noted to use his accessory muscles to breathe and so was brought to the emergency room. Noted to be septic here with increased lactic acid, fevers and elevated white count. Chest x-ray shows left lower lobe pneumonia.  PAST MEDICAL HISTORY:   Past Medical History:  Diagnosis Date  . Asthma   . Brain injury (HCC) 2002  . Diabetes mellitus without complication (HCC)   . Quadriplegia (HCC)   . Seizures (HCC)     PAST SURGICAL HISTORY:   Past Surgical History:  Procedure Laterality Date  . BRAIN SURGERY  2002  . CSF SHUNT    . SP PERC PLACE GASTRIC TUBE    . TRACHEOSTOMY TUBE PLACEMENT      SOCIAL HISTORY:   Social History  Substance Use Topics  . Smoking status: Never Smoker  .  Smokeless tobacco: Never Used  . Alcohol use No    FAMILY HISTORY:   Family History  Problem Relation Age of Onset  . Bipolar disorder Mother   . Diabetes Father   . CAD Father     DRUG ALLERGIES:   Allergies  Allergen Reactions  . Sulfa Antibiotics Anaphylaxis  . Zosyn [Piperacillin-Tazobactam In Dex] Anaphylaxis  . Ciprofloxacin Other (See Comments)    HYPOTENSION  . Lorazepam Other (See Comments)    Per pt's father. Reaction unknown. Diazepam and clonazepam OK  . Vancomycin   . Penicillins Rash    REVIEW OF SYSTEMS:   Review of Systems  Constitutional: Positive for chills, fever and malaise/fatigue. Negative for weight loss.  HENT: Negative for ear discharge, ear pain, hearing loss, nosebleeds and tinnitus.   Eyes: Negative for blurred vision, double vision and photophobia.  Respiratory: Positive for cough and shortness of breath. Negative for hemoptysis and wheezing.   Cardiovascular: Negative for chest pain, palpitations, orthopnea and leg swelling.  Gastrointestinal: Negative for abdominal pain, constipation, diarrhea, melena, nausea and vomiting.  Genitourinary: Negative for dysuria, frequency, hematuria and urgency.  Musculoskeletal: Negative for back pain, myalgias and neck pain.  Skin: Negative for rash.  Neurological: Negative for dizziness, tingling, sensory change, speech change, focal weakness and headaches.  Endo/Heme/Allergies: Does not bruise/bleed easily.  Psychiatric/Behavioral: Negative for depression.    MEDICATIONS AT HOME:   Prior to Admission medications   Medication Sig Start Date End Date Taking?  Authorizing Provider  acetaminophen (TYLENOL) 160 MG/5ML elixir Take 640 mg by mouth every 4 (four) hours as needed for fever.   Yes [provider]  albuterol (PROVENTIL) (2.5 MG/3ML) 0.083% nebulizer solution Inhale 3 mLs into the lungs 4 (four) times daily as needed. 09/15/14  Yes [provider]  Ascorbic Acid 500 MG CHEW Chew  1,000 mg by mouth daily.    Yes [provider]  baclofen (LIORESAL) 10 MG tablet Take 1 tablet by mouth 2 (two) times daily.   Yes [provider]  bisacodyl (LAXATIVE) 10 MG suppository Place 1 suppository rectally at bedtime as needed for mild constipation, moderate constipation or severe constipation.    Yes [provider]  brompheniramine-pseudoephedrine-dextromethorphan (DIMETAPP DM) 15-1-5 MG/5ML ELIX Take 10 mLs by mouth every 6 (six) hours as needed.   Yes [provider]  Calcium Carbonate Antacid (CALCIUM CARBONATE, DOSED IN MG ELEMENTAL CALCIUM,) 1250 MG/5ML SUSP Take 500 mg of elemental calcium by mouth daily.    Yes [provider]  cetirizine (ZYRTEC) 10 MG tablet Take 10 mg by mouth daily.    Yes [provider]  clonazePAM (KLONOPIN) 1 MG tablet Take 1 mg by mouth as needed for anxiety (for 3 or more seizures within an hour.).    Yes [provider]  diphenhydrAMINE (BENADRYL) 25 mg capsule Take 25 mg by mouth daily as needed (to pretreat antibiotics.).    Yes [provider]  EPINEPHrine 0.3 mg/0.3 mL IJ SOAJ injection Inject 0.3 mg into the muscle as needed.    Yes [provider]  glucagon (GLUCAGON EMERGENCY) 1 MG injection Inject 1 mg into the muscle as needed.   Yes [provider]  guaiFENesin (ROBITUSSIN) 100 MG/5ML SOLN Take 5 mLs by mouth every 4 (four) hours as needed for cough or to loosen phlegm.   Yes [provider]  insulin aspart (NOVOLOG) 100 UNIT/ML injection Inject 8 Units into the skin as needed for high blood sugar. Prn before meals for blood glucose greater than 200    Yes [provider]  insulin detemir (LEVEMIR) 100 UNIT/ML injection Inject 45 Units into the skin at bedtime.   Yes [provider]  Inulin (FIBERCHOICE) 2 g CHEW Chew 2 tablets by mouth daily.   Yes [provider]  lactulose (CHRONULAC) 10 GM/15ML solution Take 30 mLs by  mouth 2 (two) times daily.   Yes [provider]  lamoTRIgine (LAMICTAL) 25 MG tablet Take 25 mg by mouth daily. Pt is tapering up starting at 25mg  for 2 weeks, then pt will take 1 tab bid for 2 weeks, then pt will take 1 tab qam and 2 tabs qpm for 2 weeks, then 2 tabs bid for 2 weeks, then 50mg  qam and 75 mg qpm for 2 weeks, then 75mg  bid for 2 weeks, then 100mg  qam and 75 qpm for 2 weeks, then 100mg  bid for 2 weeks, then 125mg  qam and 100mg  qpm for 2 weeks, then 125mg  bid   Yes [provider]  levETIRAcetam (KEPPRA) 100 MG/ML solution Place 2,000 mg into feeding tube 2 (two) times daily.  01/19/15  Yes [provider]  mupirocin cream (BACTROBAN) 2 % Apply 1 application topically as needed.   Yes [provider]  nystatin (NYSTATIN) powder Apply 1 g topically 2 (two) times daily as needed.   Yes [provider]  PEDIATRIC MULTIPLE VITAMINS PO Take 2 tablets by mouth daily.   Yes [provider]  PULMICORT 0.5 MG/2ML nebulizer solution Inhale 1 vial into the lungs 2 (two) times daily. 02/08/15  Yes [provider]  ranitidine (ZANTAC) 150 MG/10ML syrup Take 10 mLs by mouth 2 (two) times daily. 12/21/14  Yes [provider]  sodium chloride irrigation 0.9 % irrigation Irrigate with as directed as needed.   Yes [provider]  triamcinolone lotion (KENALOG) 0.1 % Apply 1 application topically 2 (two) times daily as needed.   Yes [provider]  Valproic Acid (DEPAKENE) 250 MG/5ML SYRP syrup Take 10 mLs by mouth every 8 (eight) hours.  01/28/15  Yes [provider]  Vitamins A & D (VITAMIN A & D) ointment Apply 1 application topically as needed for dry skin.   Yes [provider]  zinc oxide 20 % ointment Apply 1 application topically 2 (two) times daily as needed for irritation.   Yes [provider]  Amino Acids-Protein Hydrolys (FEEDING SUPPLEMENT, PRO-STAT SUGAR FREE 64,) LIQD Take 30 mLs  by mouth daily. 03/18/15   Altamese Dilling, MD      VITAL SIGNS:  Blood pressure (!) 143/91, pulse (!) 112, temperature (!) 100.6 F (38.1 C), temperature source Oral, resp. rate 18, SpO2 98 %.  PHYSICAL EXAMINATION:   Physical Exam  GENERAL:  26 y.o.-year-old patient lying in the bed with no acute distress.  EYES: Pupils equal, round, reactive to light and accommodation. No scleral icterus. Extraocular muscles intact.  HEENT: Head atraumatic, normocephalic. Oropharynx and nasopharynx clear.  NECK:  Supple, no jugular venous distention. No thyroid enlargement, no tenderness.  LUNGS: coarse breath sounds bilaterally, bibasilar rhonchi heard.  no wheezing, rales  or crepitation. No use of accessory muscles of respiration.  CARDIOVASCULAR: S1, S2 normal. No murmurs, rubs, or gallops.  ABDOMEN: Soft, nontender, nondistended. Bowel sounds present. No organomegaly or mass.  EXTREMITIES: No pedal edema, cyanosis, or clubbing.  NEUROLOGIC: Cranial nerves II through XII are intact. Quadriplegic . Sensation intact. Muscle strength 2-3/5 all extremities. Gait not checked.  PSYCHIATRIC: The patient is alert and at baseline, answers some simple questions. Repeating words.  SKIN: No obvious rash, lesion, or ulcer.   LABORATORY PANEL:   CBC  Recent Labs Lab 12/09/16 1229  WBC 13.7*  HGB 12.9*  HCT 36.1*  PLT 141*   ------------------------------------------------------------------------------------------------------------------  Chemistries   Recent Labs Lab 12/09/16 1229  NA 123*  K 3.6  CL 89*  CO2 24  GLUCOSE 204*  BUN <5*  CREATININE 0.38*  CALCIUM 8.9  AST 45*  ALT 76*  ALKPHOS 57  BILITOT 0.9   ------------------------------------------------------------------------------------------------------------------  Cardiac Enzymes No results for input(s): TROPONINI in the last 168  hours. ------------------------------------------------------------------------------------------------------------------  RADIOLOGY:  Dg Chest Port 1 View  Result Date: 12/09/2016 CLINICAL DATA:  Fever and hypoxia. EXAM: PORTABLE CHEST 1 VIEW COMPARISON:  06/05/2015 and prior radiographs FINDINGS: A tracheostomy tube again noted on this rotated study. Probable left lower lung airspace disease noted. There is no evidence of pneumothorax or pleural effusion. A catheter overlying the right chest is noted. IMPRESSION: Probable left lower lung airspace disease/pneumonia. Electronically Signed   By: Harmon Pier M.D.   On: 12/09/2016 11:27    EKG:   Orders placed or performed during the hospital encounter of 01/14/15  . EKG 12-Lead  . EKG 12-Lead  . EKG    IMPRESSION AND PLAN:   Raydan Schlabach  is a 26 y.o. 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  #1 sepsis-secondary to community-acquired pneumonia, left lower lobe infiltrate on chest x-ray. -Family denies any overt aspiration episodes. Silent aspiration cannot be ruled out. His last hospitalization for pneumonia was almost 2 years ago. -He feeds himself orally, has a PEG tube which they use for medications. -Allergy to multiple antibiotics. So we'll start Levaquin and clindamycin in case any aspiration. -IV fluids. Monitor and stepdown unit. -Repeat lactic acid.  #2 acute respiratory failure with hypoxia-has a trach, on room air at baseline. Currently requiring 45% FiO2. -Some tachypnea noted as well. Will admit to ICU for close monitoring, increased suctioning of respiratory secretions. -Pulmonary consult. Last time patient has required a bronchoscopy to clear mucous plug.  Monitor closely. -Continue IV antibiotics -continue inhalers and nebulizers  #3 diabetes mellitus-insulin-dependent. Continue Levemir at bedtime. Also on sliding scale insulin currently.  #4 seizure disorder-continue home medications. Started recently and Lamictal and being titrated up. Also on Keppra and Depakene (this will be discontinued once Lamictal is titrated up as outpatient)  #5 DVT prophylaxis-on Lovenox    All the records are reviewed and case discussed with ED provider. Management plans discussed with the patient, family and they are in agreement.  CODE STATUS: Full Code  TOTAL CRITICAL CARE TIME SPENT IN  TAKING CARE OF THIS PATIENT: 55 minutes.    Enid Baas M.D on 12/09/2016 at 2:13 PM  Between 7am to 6pm - Pager - [phone]  After 6pm go to [email or web address] - Social research officer, government  [facility]  Office  [phone]  CC: Primary care physician; Judeen Hammans, MD

## 2016-12-10 ENCOUNTER — Inpatient Hospital Stay: Payer: Medicaid Other

## 2016-12-10 DIAGNOSIS — A4101 Sepsis due to Methicillin susceptible Staphylococcus aureus: Secondary | ICD-10-CM

## 2016-12-10 DIAGNOSIS — J189 Pneumonia, unspecified organism: Secondary | ICD-10-CM

## 2016-12-10 DIAGNOSIS — J181 Lobar pneumonia, unspecified organism: Secondary | ICD-10-CM

## 2016-12-10 LAB — BLOOD CULTURE ID PANEL (REFLEXED)
Acinetobacter baumannii: NOT DETECTED
CANDIDA ALBICANS: NOT DETECTED
CANDIDA GLABRATA: NOT DETECTED
CANDIDA KRUSEI: NOT DETECTED
Candida parapsilosis: NOT DETECTED
Candida tropicalis: NOT DETECTED
ENTEROBACTER CLOACAE COMPLEX: NOT DETECTED
ENTEROBACTERIACEAE SPECIES: NOT DETECTED
ENTEROCOCCUS SPECIES: NOT DETECTED
ESCHERICHIA COLI: NOT DETECTED
Haemophilus influenzae: NOT DETECTED
Klebsiella oxytoca: NOT DETECTED
Klebsiella pneumoniae: NOT DETECTED
LISTERIA MONOCYTOGENES: NOT DETECTED
Methicillin resistance: NOT DETECTED
Neisseria meningitidis: NOT DETECTED
PROTEUS SPECIES: NOT DETECTED
PSEUDOMONAS AERUGINOSA: NOT DETECTED
STREPTOCOCCUS AGALACTIAE: NOT DETECTED
STREPTOCOCCUS PNEUMONIAE: NOT DETECTED
STREPTOCOCCUS PYOGENES: NOT DETECTED
STREPTOCOCCUS SPECIES: NOT DETECTED
Serratia marcescens: NOT DETECTED
Staphylococcus aureus (BCID): NOT DETECTED
Staphylococcus species: DETECTED — AB

## 2016-12-10 LAB — PHOSPHORUS: Phosphorus: 2.9 mg/dL (ref 2.5–4.6)

## 2016-12-10 LAB — BASIC METABOLIC PANEL
Anion gap: 6 (ref 5–15)
BUN: <5 mg/dL — ABNORMAL LOW (ref 6–20)
CO2: 23 mmol/L (ref 22–32)
Calcium: 8.7 mg/dL — ABNORMAL LOW (ref 8.9–10.3)
Chloride: 105 mmol/L (ref 101–111)
Creatinine, Ser: 0.41 mg/dL — ABNORMAL LOW (ref 0.61–1.24)
GFR calc Af Amer: >60 mL/min (ref >60)
GFR calc non Af Amer: >60 mL/min (ref >60)
GLUCOSE: 248 mg/dL — AB (ref 65–99)
POTASSIUM: 4 mmol/L (ref 3.5–5.1)
Sodium: 134 mmol/L — ABNORMAL LOW (ref 135–145)

## 2016-12-10 LAB — GLUCOSE, CAPILLARY
GLUCOSE-CAPILLARY: 319 mg/dL — AB (ref 65–99)
GLUCOSE-CAPILLARY: 352 mg/dL — AB (ref 65–99)
Glucose-Capillary: 181 mg/dL — ABNORMAL HIGH (ref 65–99)
Glucose-Capillary: 268 mg/dL — ABNORMAL HIGH (ref 65–99)

## 2016-12-10 LAB — PROCALCITONIN: Procalcitonin: 0.45 ng/mL

## 2016-12-10 LAB — CBC
HEMATOCRIT: 33.9 % — AB (ref 40.0–52.0)
HEMOGLOBIN: 11.8 g/dL — AB (ref 13.0–18.0)
MCH: 31.2 pg (ref 26.0–34.0)
MCHC: 34.9 g/dL (ref 32.0–36.0)
MCV: 89.5 fL (ref 80.0–100.0)
Platelets: 145 10*3/uL — ABNORMAL LOW (ref 150–440)
RBC: 3.79 MIL/uL — ABNORMAL LOW (ref 4.40–5.90)
RDW: 13.6 % (ref 11.5–14.5)
WBC: 13.5 10*3/uL — ABNORMAL HIGH (ref 3.8–10.6)

## 2016-12-10 LAB — MAGNESIUM: Magnesium: 1.9 mg/dL (ref 1.7–2.4)

## 2016-12-10 MED ORDER — IPRATROPIUM-ALBUTEROL 0.5-2.5 (3) MG/3ML IN SOLN
3.0000 mL | Freq: Four times a day (QID) | RESPIRATORY_TRACT | Status: DC
  Administered 2016-12-10 – 2016-12-11 (×5): 3 mL via RESPIRATORY_TRACT
  Filled 2016-12-10 (×5): qty 3

## 2016-12-10 MED ORDER — ACETYLCYSTEINE 20 % IN SOLN
2.0000 mL | Freq: Four times a day (QID) | RESPIRATORY_TRACT | Status: DC
  Administered 2016-12-10 (×2): 2 mL via RESPIRATORY_TRACT
  Administered 2016-12-11: 02:00:00 via RESPIRATORY_TRACT
  Administered 2016-12-11: 4 mL via RESPIRATORY_TRACT
  Administered 2016-12-11: 2 mL via RESPIRATORY_TRACT
  Administered 2016-12-11 – 2016-12-12 (×2): via RESPIRATORY_TRACT
  Administered 2016-12-12 – 2016-12-13 (×4): 2 mL via RESPIRATORY_TRACT
  Administered 2016-12-13: 07:00:00 via RESPIRATORY_TRACT
  Filled 2016-12-10 (×14): qty 4

## 2016-12-10 NOTE — Progress Notes (Signed)
Patients sats 86%, patient suctioned getting back small amount white beige.Sats increased to 93% after suction.

## 2016-12-10 NOTE — Progress Notes (Signed)
Initial Nutrition Assessment  DOCUMENTATION CODES:   Not applicable  INTERVENTION:  1. No interventions necessary at this point in time 2. Continue Pro-Stat 30mL daily  NUTRITION DIAGNOSIS:   Swallowing difficulty related to other (see comment) (TBI) as evidenced by per patient/family report.  GOAL:   Patient will meet greater than or equal to 90% of their needs  MONITOR:   PO intake, Skin, Weight trends, I & O's, Labs  REASON FOR ASSESSMENT:   Ventilator    ASSESSMENT:  Spoke with patient's Mother at bedside. History of TBI, Quadriplegia, Trach/PEG - he can't provide history Uses vent nocturnally, oxygen during the day. She denies weight loss. Endorses good appetite, "as soon as he finishes a meal, she's thinking about his next one."  Consumes NDD1-pureed, takes medications via PEG No acute complaints Nutrition-Focused physical exam completed. Findings are no fat depletion, no muscle depletion, and no edema.  Labs and medications reviewed: CBGs 181-249, Na 134 Ca-Carbonate, Vitamin C NS @ 15100mL/hr  Diet Order:  DIET - DYS 1 Room service appropriate? Yes; Fluid consistency: Thin  Skin:  Reviewed, no issues  Last BM:  PTA  Height:   Ht Readings from Last 1 Encounters:  12/09/16 5\' 4"  (1.626 m)    Weight:   Wt Readings from Last 1 Encounters:  12/09/16 161 lb 9.6 oz (73.3 kg)    Ideal Body Weight:  53.18 kg  BMI:  Body mass index is 27.74 kg/m.  Estimated Nutritional Needs:   Kcal:  1650-1830 calories  Protein:  88-110 grams  Fluid:  >/= 1.65L  EDUCATION NEEDS:   No education needs identified at this time  Dionne AnoWilliam M. Ladena Jacquez, MS, RD LDN Inpatient Clinical Dietitian Pager [phone]

## 2016-12-10 NOTE — Consult Note (Signed)
PULMONARY / CRITICAL CARE MEDICINE   Name: Genia HotterJoshua Haze MRN: [id number] DOB: Nov 02, 1990    ADMISSION DATE:  12/09/2016   CONSULTATION DATE:  12/09/2016  REFERRING MD:  Dr. Nemiah CommanderKalisetti  REASON: ICU management of Pneumonia  CHIEF COMPLAINT:  Shortness of breath  HISTORY OF PRESENT ILLNESS:   This is a 26 y/o WM  With a history of asthma, traumatic brain injury, typ 2 diabetes, quadriplegia and seizures who presented to the ED via EMS with worsening dyspnea and hypoxia. SPO2 on arrival was in the 80s. Patient was being treated for pneumonia. Chest x-ray suggested left lower lobe pneumonia. He is being admitted to the ICU for chronic vent management and CAP  PAST MEDICAL HISTORY :  He  has a past medical history of Asthma; Brain injury (HCC) (2002); Diabetes mellitus without complication (HCC); Quadriplegia (HCC); and Seizures (HCC).  PAST SURGICAL HISTORY: He  has a past surgical history that includes Brain surgery (2002); Tracheostomy tube placement; SP PERC PLACE GASTRIC TUBE; and CSF shunt.  Allergies  Allergen Reactions  . Sulfa Antibiotics Anaphylaxis  . Zosyn [Piperacillin-Tazobactam In Dex] Anaphylaxis  . Ciprofloxacin Other (See Comments)    HYPOTENSION  . Lorazepam Other (See Comments)    Per pt's father. Reaction unknown. Diazepam and clonazepam OK  . Vancomycin   . Penicillins Rash    No current facility-administered medications on file prior to encounter.    Current Outpatient Prescriptions on File Prior to Encounter  Medication Sig  . albuterol (PROVENTIL) (2.5 MG/3ML) 0.083% nebulizer solution Inhale 3 mLs into the lungs 4 (four) times daily as needed.  . Ascorbic Acid 500 MG CHEW Chew 1,000 mg by mouth daily.   . baclofen (LIORESAL) 10 MG tablet Take 1 tablet by mouth 2 (two) times daily.  . bisacodyl (LAXATIVE) 10 MG suppository Place 1 suppository rectally at bedtime as needed for mild constipation, moderate constipation or severe constipation.   . Calcium  Carbonate Antacid (CALCIUM CARBONATE, DOSED IN MG ELEMENTAL CALCIUM,) 1250 MG/5ML SUSP Take 500 mg of elemental calcium by mouth daily.   . cetirizine (ZYRTEC) 10 MG tablet Take 10 mg by mouth daily.   . clonazePAM (KLONOPIN) 1 MG tablet Take 1 mg by mouth as needed for anxiety (for 3 or more seizures within an hour.).   Marland Kitchen. diphenhydrAMINE (BENADRYL) 25 mg capsule Take 25 mg by mouth daily as needed (to pretreat antibiotics.).   Marland Kitchen. EPINEPHrine 0.3 mg/0.3 mL IJ SOAJ injection Inject 0.3 mg into the muscle as needed.   Marland Kitchen. glucagon (GLUCAGON EMERGENCY) 1 MG injection Inject 1 mg into the muscle as needed.  . insulin aspart (NOVOLOG) 100 UNIT/ML injection Inject 8 Units into the skin as needed for high blood sugar. Prn before meals for blood glucose greater than 200   . lactulose (CHRONULAC) 10 GM/15ML solution Take 30 mLs by mouth 2 (two) times daily.  Marland Kitchen. levETIRAcetam (KEPPRA) 100 MG/ML solution Place 2,000 mg into feeding tube 2 (two) times daily.   Marland Kitchen. PULMICORT 0.5 MG/2ML nebulizer solution Inhale 1 vial into the lungs 2 (two) times daily.  . ranitidine (ZANTAC) 150 MG/10ML syrup Take 10 mLs by mouth 2 (two) times daily.  . Valproic Acid (DEPAKENE) 250 MG/5ML SYRP syrup Take 10 mLs by mouth every 8 (eight) hours.   . Amino Acids-Protein Hydrolys (FEEDING SUPPLEMENT, PRO-STAT SUGAR FREE 64,) LIQD Take 30 mLs by mouth daily.    FAMILY HISTORY:  His indicated that his mother is alive. He indicated that his father is  alive.    SOCIAL HISTORY: He  reports that he has never smoked. He has never used smokeless tobacco. He reports that he does not drink alcohol or use drugs.  REVIEW OF SYSTEMS:    UNABLE TO OBTAIN DUE TO PATIENT'S CONDITION  SUBJECTIVE:   VITAL SIGNS: BP 113/64   Pulse 82   Temp 97.5 F (36.4 C) (Axillary)   Resp 18   Ht 5\' 4"  (1.626 m)   Wt 161 lb 9.6 oz (73.3 kg)   SpO2 97%   BMI 27.74 kg/m   HEMODYNAMICS:    VENTILATOR SETTINGS: Vent Mode: PRVC FiO2 (%):  [35 %-45  %] 45 % Set Rate:  [14 bmp] 14 bmp Vt Set:  [470 mL] 470 mL PEEP:  [5 cmH20] 5 cmH20 Plateau Pressure:  [22 cmH20] 22 cmH20  INTAKE / OUTPUT: I/O last 3 completed shifts: In: 1145.7 [I.V.:1145.7] Out: -   PHYSICAL EXAMINATION: General:  No acute distress , awake Neuro:   Alert, follows basic comments , quadriplegic HEENT:  PERRLA,  tracheostomy Cardiovascular: RRR, S1/S2, no MRG Lungs: Normal WOB, + breath sounds, + rhonchi, no wheezing Abdomen: Peg tube, normal bowel sounds Musculoskeletal:  Contractures in BLLE Skin: warm and dry  LABS:  BMET  Recent Labs Lab 12/09/16 1229 12/10/16 0439  NA 123* 134*  K 3.6 4.0  CL 89* 105  CO2 24 23  BUN <5* <5*  CREATININE 0.38* 0.41*  GLUCOSE 204* 248*    Electrolytes  Recent Labs Lab 12/09/16 1229 12/10/16 0439  CALCIUM 8.9 8.7*  MG  --  1.9  PHOS  --  2.9    CBC  Recent Labs Lab 12/09/16 1229 12/10/16 0439  WBC 13.7* 13.5*  HGB 12.9* 11.8*  HCT 36.1* 33.9*  PLT 141* 145*    Coag's No results for input(s): APTT, INR in the last 168 hours.  Sepsis Markers  Recent Labs Lab 12/09/16 1229 12/09/16 1813 12/09/16 2056 12/10/16 0439  LATICACIDVEN 3.4* 3.0* 2.5*  --   PROCALCITON 0.22  --   --  0.45    ABG No results for input(s): PHART, PCO2ART, PO2ART in the last 168 hours.  Liver Enzymes  Recent Labs Lab 12/09/16 1229  AST 45*  ALT 76*  ALKPHOS 57  BILITOT 0.9  ALBUMIN 3.6    Cardiac Enzymes No results for input(s): TROPONINI, PROBNP in the last 168 hours.  Glucose  Recent Labs Lab 12/09/16 1656 12/09/16 2148 12/10/16 0753  GLUCAP 291* 349* 181*    Imaging Dg Chest Port 1 View  Result Date: 12/10/2016 CLINICAL DATA:  Worsening oxygen saturation.  Tracheostomy. EXAM: PORTABLE CHEST 1 VIEW COMPARISON:  12/09/2016 FINDINGS: Worsened consolidation/ collapse in the left lung. Question increasing infiltrate in the right lower lobe as well. Tracheostomy appears satisfactory. Shunt  tube overlies the right chest. IMPRESSION: Worsened consolidation/ collapse of the left lung. Hazy opacity in the right lower lobe consistent with developing pneumonia. Electronically Signed   By: Paulina Fusi M.D.   On: 12/10/2016 08:03   Dg Chest Port 1 View  Result Date: 12/09/2016 CLINICAL DATA:  Fever and hypoxia. EXAM: PORTABLE CHEST 1 VIEW COMPARISON:  06/05/2015 and prior radiographs FINDINGS: A tracheostomy tube again noted on this rotated study. Probable left lower lung airspace disease noted. There is no evidence of pneumothorax or pleural effusion. A catheter overlying the right chest is noted. IMPRESSION: Probable left lower lung airspace disease/pneumonia. Electronically Signed   By: Harmon Pier M.D.   On: 12/09/2016 11:27  STUDIES:  none  CULTURES: Blood cultures x 2  ANTIBIOTICS: Levaquin  Clindamycin  SIGNIFICANT EVENTS: 06/02>admitted with LLL pneumonia  LINES/TUBES: Peg tube Tracheostomy tube  DISCUSSION: 26 y/o WM with chronic respiratory failure on nocturnal vent support presenting with CAP and acute on chronic respiratory failure  ASSESSMENT  CAP Acute on chronic hypoxemic respiratory failure Lactic acidosis Chronic hyponatremia H/o T2DM H/O Asthma H/O Seizures H/O TBI with quadriplegia  PLAN Nocturnal vent support VAP protocol Nebulized bronchodilator Procalcitonin protocol ABG and CXR prn IV fluids Resume all home medications Blood glucose monitoring with SSI coverage   FAMILY  - Updates: Mother updated at bedside.   - Inter-disciplinary family meet or Palliative Care meeting due by:  day 7   [facility]dalene S. [facility] ANP-BC Pulmonary and Critical Care Medicine [facility] Pager [phone] or [phone]  12/10/2016, 10:10 AM   Patient seen, examined, agree with findings, assessment, plan. The patient is not able to provide history, he is currently on a ventilator. History is obtained from the chart, and from the patient's  stepmother at the bedside. He was having increasing respiratory difficulty over the last few days, they were suctioning, increasing amounts of secretions from his lungs. He has a history of TBI with vent dependence at night only. He usually does not require the vent during the day unless he is sick. On my personal review the patient's chest imaging. There is chronic left lower lobe atelectasis with chronic left-sided mediastinal shift, this appears significantly more pronounced on the most recent chest x-ray imaging consistent with likely left-sided pneumonia and atelectasis. We'll attempt to suction aggressively with pulmonary toilet and chest percussion. Continue nebulizers, Mucomyst. Will wean to trach collar as tolerated with this strategy, if he cannot be weaned, we will consider bronchoscopy.  GNR in sputum, results pending, pt likely colonized. Continue abx.   Wells Guiles-Deep Sukhdeep Wieting, M.D. 12/10/2016

## 2016-12-10 NOTE — Progress Notes (Signed)
Pt tolerated vent. Denies pain and discomfort. Mother was able to get adapter for peg tube. Abdomen remain firm and distended. Continue on IVF.

## 2016-12-10 NOTE — Progress Notes (Signed)
Took patient off of vent and placed on 45% trach collar for the day. Will continue to monitor.

## 2016-12-10 NOTE — Progress Notes (Signed)
[employer] -  at [facility]                                                                                                                                                                                  Patient Demographics   Roger Tate, is a 26 y.o. male, DOB - 09-Jun-1991, WUJ:[id number]  Admit date - 12/09/2016   Admitting Physician Enid Baas, MD  Outpatient Primary MD for the patient is Soles, Willaim Rayas, MD   LOS - 1  Subjective: Pt admitted with pna, Patient's mother at bedside she states that he is hungry     Review of Systems:   CONSTITUTIONAL: Unable to provide due to being on the vent   Vitals:   Vitals:   12/10/16 0400 12/10/16 0500 12/10/16 0600 12/10/16 0816  BP: (!) 106/57 110/67 113/64   Pulse: 81 81 82   Resp: 14 15 18    Temp:      TempSrc:      SpO2: 96% 93% 96% 97%  Weight:      Height:        Wt Readings from Last 3 Encounters:  12/09/16 161 lb 9.6 oz (73.3 kg)  06/06/15 175 lb (79.4 kg)  03/17/15 156 lb 1.4 oz (70.8 kg)     Intake/Output Summary (Last 24 hours) at 12/10/16 1227 Last data filed at 12/10/16 0600  Gross per 24 hour  Intake          1145.67 ml  Output                0 ml  Net          1145.67 ml    Physical Exam:   GENERAL: Pleasant-appearing in no apparent distress.  HEAD, EYES, EARS, NOSE AND THROAT: Atraumatic, normocephalic. Extraocular muscles are intact. Pupils equal and reactive to light. Sclerae anicteric. No conjunctival injection. No oro-pharyngeal erythema.  NECK: Supple. There is no jugular venous distention. No bruits, no lymphadenopathy, no thyromegaly.  HEART: Regular rate and rhythm,. No murmurs, no rubs, no clicks.  LUNGS: decreased breath sounds bilaterally  ABDOMEN: Soft, flat, nontender, nondistended. Has good bowel sounds. No hepatosplenomegaly appreciated.  EXTREMITIES: No evidence of any cyanosis, clubbing, or peripheral edema.  +2 pedal and radial pulses bilaterally.   NEUROLOGIC: The patient is alert, awake, and oriented x3 with no focal motor or sensory deficits appreciated bilaterally.  SKIN: Moist and warm with no rashes appreciated.  Psych: Not anxious, depressed LN: No inguinal LN enlargement    Antibiotics   Anti-infectives    Start     Dose/Rate Route Frequency Ordered Stop   12/10/16 1800  levofloxacin (LEVAQUIN) IVPB 500  mg  Status:  Discontinued     500 mg 100 mL/hr over 60 Minutes Intravenous Every 24 hours 12/09/16 1753 12/09/16 1757   12/10/16 1000  levofloxacin (LEVAQUIN) IVPB 500 mg     500 mg 100 mL/hr over 60 Minutes Intravenous Every 24 hours 12/09/16 1757     12/09/16 2200  clindamycin (CLEOCIN) capsule 300 mg     300 mg Oral Every 8 hours 12/09/16 1411     12/09/16 1145  levofloxacin (LEVAQUIN) IVPB 750 mg     750 mg 100 mL/hr over 90 Minutes Intravenous  Once 12/09/16 1140 12/09/16 1439      Medications   Scheduled Meds: . acetylcysteine  2 mL Nebulization Q6H  . baclofen  10 mg Oral BID  . budesonide  0.5 mg Inhalation BID  . calcium carbonate  2.5 tablet Oral Daily  . clindamycin  300 mg Oral Q8H  . enoxaparin (LOVENOX) injection  40 mg Subcutaneous Q24H  . feeding supplement (PRO-STAT SUGAR FREE 64)  30 mL Oral Daily  . guaiFENesin  600 mg Oral BID  . insulin aspart  0-5 Units Subcutaneous QHS  . insulin aspart  0-9 Units Subcutaneous TID WC  . insulin detemir  45 Units Subcutaneous QHS  . ipratropium-albuterol  3 mL Nebulization Q6H  . lactulose  20 g Oral BID  . lamoTRIgine  25 mg Oral Daily  . levETIRAcetam  2,000 mg Per Tube BID  . loratadine  10 mg Oral Daily  . mupirocin ointment   Nasal BID  . psyllium  1 packet Oral Daily  . ranitidine  150 mg Oral BID  . Valproate Sodium  500 mg Oral Q8H  . vitamin C  1,000 mg Oral Daily   Continuous Infusions: . sodium chloride 100 mL/hr at 12/10/16 0417  . levofloxacin (LEVAQUIN) IV Stopped (12/10/16 1054)   PRN Meds:.acetaminophen **OR** acetaminophen,  bisacodyl, clonazePAM, liver oil-zinc oxide, nystatin, ondansetron **OR** ondansetron (ZOFRAN) IV, vitamin A & D   Data Review:   Micro Results Recent Results (from the past 240 hour(s))  Culture, blood (routine x 2)     Status: None (Preliminary result)   Collection Time: 12/09/16 12:15 PM  Result Value Ref Range Status   Specimen Description BLOOD LEFT UPPER ARM  Final   Special Requests   Final    BOTTLES DRAWN AEROBIC AND ANAEROBIC Blood Culture adequate volume   Culture NO GROWTH < 24 HOURS  Final   Report Status PENDING  Incomplete  Culture, blood (routine x 2)     Status: None (Preliminary result)   Collection Time: 12/09/16 12:38 PM  Result Value Ref Range Status   Specimen Description BLOOD LEFT UPPER ARM  Final   Special Requests   Final    BOTTLES DRAWN AEROBIC AND ANAEROBIC Blood Culture adequate volume   Culture NO GROWTH < 24 HOURS  Final   Report Status PENDING  Incomplete  Culture, respiratory (NON-Expectorated)     Status: None (Preliminary result)   Collection Time: 12/09/16  1:41 PM  Result Value Ref Range Status   Specimen Description TRACHEAL ASPIRATE  Final   Special Requests NONE  Final   Gram Stain   Final    FEW WBC PRESENT, PREDOMINANTLY PMN ABUNDANT GRAM NEGATIVE RODS FEW GRAM POSITIVE COCCI IN PAIRS RARE GRAM POSITIVE RODS Performed at [facility], [address] [place], [place] [address]    Culture PENDING  Incomplete   Report Status PENDING  Incomplete  MRSA PCR  Screening     Status: None   Collection Time: 12/09/16  4:53 PM  Result Value Ref Range Status   MRSA by PCR NEGATIVE NEGATIVE Final    Comment:        The GeneXpert MRSA Assay (FDA approved for NASAL specimens only), is one component of a comprehensive MRSA colonization surveillance program. It is not intended to diagnose MRSA infection nor to guide or monitor treatment for MRSA infections.     Radiology Reports Dg Chest Port 1 View  Result Date:  12/10/2016 CLINICAL DATA:  Worsening oxygen saturation.  Tracheostomy. EXAM: PORTABLE CHEST 1 VIEW COMPARISON:  12/09/2016 FINDINGS: Worsened consolidation/ collapse in the left lung. Question increasing infiltrate in the right lower lobe as well. Tracheostomy appears satisfactory. Shunt tube overlies the right chest. IMPRESSION: Worsened consolidation/ collapse of the left lung. Hazy opacity in the right lower lobe consistent with developing pneumonia. Electronically Signed   By: Paulina FusiMark  Shogry M.D.   On: 12/10/2016 08:03   Dg Chest Port 1 View  Result Date: 12/09/2016 CLINICAL DATA:  Fever and hypoxia. EXAM: PORTABLE CHEST 1 VIEW COMPARISON:  06/05/2015 and prior radiographs FINDINGS: A tracheostomy tube again noted on this rotated study. Probable left lower lung airspace disease noted. There is no evidence of pneumothorax or pleural effusion. A catheter overlying the right chest is noted. IMPRESSION: Probable left lower lung airspace disease/pneumonia. Electronically Signed   By: Harmon PierJeffrey  Hu M.D.   On: 12/09/2016 11:27     CBC  Recent Labs Lab 12/09/16 1229 12/10/16 0439  WBC 13.7* 13.5*  HGB 12.9* 11.8*  HCT 36.1* 33.9*  PLT 141* 145*  MCV 89.6 89.5  MCH 31.9 31.2  MCHC 35.7 34.9  RDW 13.6 13.6  LYMPHSABS 0.9*  --   MONOABS 0.7  --   EOSABS 0.0  --   BASOSABS 0.0  --     Chemistries   Recent Labs Lab 12/09/16 1229 12/10/16 0439  NA 123* 134*  K 3.6 4.0  CL 89* 105  CO2 24 23  GLUCOSE 204* 248*  BUN <5* <5*  CREATININE 0.38* 0.41*  CALCIUM 8.9 8.7*  MG  --  1.9  AST 45*  --   ALT 76*  --   ALKPHOS 57  --   BILITOT 0.9  --    ------------------------------------------------------------------------------------------------------------------ estimated creatinine clearance is 130.5 mL/min (A) (by C-G formula based on SCr of 0.41 mg/dL (L)). ------------------------------------------------------------------------------------------------------------------ No results for  input(s): HGBA1C in the last 72 hours. ------------------------------------------------------------------------------------------------------------------ No results for input(s): CHOL, HDL, LDLCALC, TRIG, CHOLHDL, LDLDIRECT in the last 72 hours. ------------------------------------------------------------------------------------------------------------------ No results for input(s): TSH, T4TOTAL, T3FREE, THYROIDAB in the last 72 hours.  Invalid input(s): FREET3 ------------------------------------------------------------------------------------------------------------------ No results for input(s): VITAMINB12, FOLATE, FERRITIN, TIBC, IRON, RETICCTPCT in the last 72 hours.  Coagulation profile No results for input(s): INR, PROTIME in the last 168 hours.  No results for input(s): DDIMER in the last 72 hours.  Cardiac Enzymes No results for input(s): CKMB, TROPONINI, MYOGLOBIN in the last 168 hours.  Invalid input(s): CK ------------------------------------------------------------------------------------------------------------------ Invalid input(s): POCBNP    Assessment & Plan   Roger Tate  is a 26 y.o. 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  #1 sepsis-secondary to community-acquired pneumonia, left lower lobe infiltrate on chest x-ray. Chest x-ray shows worsening,  continue respiratory suction and pulmonary toileting Continue IV antibiotics   #2 acute respiratory failure with hypoxia- Continue vent management as per the intensivist Continue nebulizers and breathing treatments   #3 diabetes mellitus-insulin-dependent. Continue Levemir at bedtime. Also on sliding scale insulin currently.  #4 seizure  disorder-continue home medications. Started recently and Lamictal and being titrated up. Also on Keppra and Depakene (this will be discontinued once Lamictal is titrated up as outpatient)  #5 DVT prophylaxis-on Lovenox       Code Status Orders        Start     Ordered   12/09/16 1652  Full code  Continuous     12/09/16 1651    Code Status History    Date Active Date Inactive Code Status Order ID Comments User Context   03/07/2015  7:10 PM 03/18/2015  8:31 PM Full Code [id number]  Houston Siren, MD Inpatient   01/14/2015  5:08 PM 01/19/2015  4:00 PM Full Code [id number]  Katharina Caper, MD Inpatient           Consults  intensivitst   DVT Prophylaxis  Lovenox   Lab Results  Component Value Date   PLT 145 (L) 12/10/2016     Time Spent in minutes   Greater than 50% of time spent in care coordination and counseling patient regarding the condition and plan of care.   Auburn Bilberry M.D on 12/10/2016 at 12:27 PM  Between 7am to 6pm - Pager - [phone]  After 6pm go to [email or web address] - password [email or web address] [facility]  [facility]   Office  [phone]

## 2016-12-10 NOTE — Progress Notes (Signed)
PHARMACY - PHYSICIAN COMMUNICATION CRITICAL VALUE ALERT - BLOOD CULTURE IDENTIFICATION (BCID)  Results for orders placed or performed during the hospital encounter of 12/09/16  Blood Culture ID Panel (Reflexed) (Collected: 12/09/2016 12:15 PM)  Result Value Ref Range   Enterococcus species NOT DETECTED NOT DETECTED   Listeria monocytogenes NOT DETECTED NOT DETECTED   Staphylococcus species DETECTED (A) NOT DETECTED   Staphylococcus aureus NOT DETECTED NOT DETECTED   Methicillin resistance NOT DETECTED NOT DETECTED   Streptococcus species NOT DETECTED NOT DETECTED   Streptococcus agalactiae NOT DETECTED NOT DETECTED   Streptococcus pneumoniae NOT DETECTED NOT DETECTED   Streptococcus pyogenes NOT DETECTED NOT DETECTED   Acinetobacter baumannii NOT DETECTED NOT DETECTED   Enterobacteriaceae species NOT DETECTED NOT DETECTED   Enterobacter cloacae complex NOT DETECTED NOT DETECTED   Escherichia coli NOT DETECTED NOT DETECTED   Klebsiella oxytoca NOT DETECTED NOT DETECTED   Klebsiella pneumoniae NOT DETECTED NOT DETECTED   Proteus species NOT DETECTED NOT DETECTED   Serratia marcescens NOT DETECTED NOT DETECTED   Haemophilus influenzae NOT DETECTED NOT DETECTED   Neisseria meningitidis NOT DETECTED NOT DETECTED   Pseudomonas aeruginosa NOT DETECTED NOT DETECTED   Candida albicans NOT DETECTED NOT DETECTED   Candida glabrata NOT DETECTED NOT DETECTED   Candida krusei NOT DETECTED NOT DETECTED   Candida parapsilosis NOT DETECTED NOT DETECTED   Candida tropicalis NOT DETECTED NOT DETECTED    Name of physician (or Provider) Contacted: M. Tukov  Changes to prescribed antibiotics required: No, most likely contaminant, continue pt on current regimen (levaquin and clindamycin)   Conrad Zajkowski D 12/10/2016  9:36 PM

## 2016-12-11 ENCOUNTER — Inpatient Hospital Stay: Payer: Medicaid Other

## 2016-12-11 DIAGNOSIS — J9819 Other pulmonary collapse: Secondary | ICD-10-CM

## 2016-12-11 DIAGNOSIS — Z930 Tracheostomy status: Secondary | ICD-10-CM

## 2016-12-11 DIAGNOSIS — J181 Lobar pneumonia, unspecified organism: Secondary | ICD-10-CM

## 2016-12-11 DIAGNOSIS — J9621 Acute and chronic respiratory failure with hypoxia: Secondary | ICD-10-CM

## 2016-12-11 DIAGNOSIS — J9809 Other diseases of bronchus, not elsewhere classified: Secondary | ICD-10-CM

## 2016-12-11 LAB — GLUCOSE, CAPILLARY
GLUCOSE-CAPILLARY: 315 mg/dL — AB (ref 65–99)
GLUCOSE-CAPILLARY: 402 mg/dL — AB (ref 65–99)
Glucose-Capillary: 192 mg/dL — ABNORMAL HIGH (ref 65–99)
Glucose-Capillary: 358 mg/dL — ABNORMAL HIGH (ref 65–99)

## 2016-12-11 LAB — BASIC METABOLIC PANEL
Anion gap: 8 (ref 5–15)
BUN: 6 mg/dL (ref 6–20)
CHLORIDE: 104 mmol/L (ref 101–111)
CO2: 27 mmol/L (ref 22–32)
Calcium: 9.3 mg/dL (ref 8.9–10.3)
Creatinine, Ser: 0.39 mg/dL — ABNORMAL LOW (ref 0.61–1.24)
GFR calc Af Amer: >60 mL/min (ref >60)
GFR calc non Af Amer: >60 mL/min (ref >60)
GLUCOSE: 210 mg/dL — AB (ref 65–99)
POTASSIUM: 3.7 mmol/L (ref 3.5–5.1)
Sodium: 139 mmol/L (ref 135–145)

## 2016-12-11 LAB — CBC
HEMATOCRIT: 35.9 % — AB (ref 40.0–52.0)
HEMOGLOBIN: 12.5 g/dL — AB (ref 13.0–18.0)
MCH: 31.7 pg (ref 26.0–34.0)
MCHC: 34.8 g/dL (ref 32.0–36.0)
MCV: 91 fL (ref 80.0–100.0)
Platelets: 147 10*3/uL — ABNORMAL LOW (ref 150–440)
RBC: 3.95 MIL/uL — AB (ref 4.40–5.90)
RDW: 13.9 % (ref 11.5–14.5)
WBC: 7.7 10*3/uL (ref 3.8–10.6)

## 2016-12-11 LAB — PROCALCITONIN: Procalcitonin: 0.17 ng/mL

## 2016-12-11 LAB — HIV ANTIBODY (ROUTINE TESTING W REFLEX): HIV SCREEN 4TH GENERATION: NONREACTIVE

## 2016-12-11 MED ORDER — ORAL CARE MOUTH RINSE
15.0000 mL | Freq: Four times a day (QID) | OROMUCOSAL | Status: DC
  Administered 2016-12-11 – 2016-12-13 (×5): 15 mL via OROMUCOSAL

## 2016-12-11 MED ORDER — CHLORHEXIDINE GLUCONATE 0.12% ORAL RINSE (MEDLINE KIT)
15.0000 mL | Freq: Two times a day (BID) | OROMUCOSAL | Status: DC
  Administered 2016-12-11 – 2016-12-13 (×4): 15 mL via OROMUCOSAL

## 2016-12-11 MED ORDER — ALBUTEROL SULFATE (2.5 MG/3ML) 0.083% IN NEBU
2.5000 mg | INHALATION_SOLUTION | RESPIRATORY_TRACT | Status: DC | PRN
  Administered 2016-12-11: 2.5 mg via RESPIRATORY_TRACT

## 2016-12-11 MED ORDER — LAMOTRIGINE 25 MG PO TABS
25.0000 mg | ORAL_TABLET | Freq: Every day | ORAL | Status: DC
  Administered 2016-12-11 – 2016-12-13 (×3): 25 mg
  Filled 2016-12-11 (×3): qty 1

## 2016-12-11 MED ORDER — PSYLLIUM 95 % PO PACK
1.0000 | PACK | Freq: Every day | ORAL | Status: DC
  Administered 2016-12-11 – 2016-12-13 (×3): 1
  Filled 2016-12-11 (×3): qty 1

## 2016-12-11 MED ORDER — INSULIN ASPART 100 UNIT/ML ~~LOC~~ SOLN
3.0000 [IU] | Freq: Once | SUBCUTANEOUS | Status: AC
  Administered 2016-12-11: 3 [IU] via SUBCUTANEOUS
  Filled 2016-12-11: qty 3

## 2016-12-11 MED ORDER — MIDAZOLAM HCL 2 MG/2ML IJ SOLN
INTRAMUSCULAR | Status: AC
  Filled 2016-12-11: qty 4

## 2016-12-11 MED ORDER — VALPROATE SODIUM 250 MG/5ML PO SOLN
500.0000 mg | Freq: Three times a day (TID) | ORAL | Status: DC
  Administered 2016-12-11 – 2016-12-13 (×6): 500 mg
  Filled 2016-12-11 (×8): qty 10

## 2016-12-11 MED ORDER — DIPHENHYDRAMINE HCL 50 MG/ML IJ SOLN
12.5000 mg | Freq: Four times a day (QID) | INTRAMUSCULAR | Status: DC | PRN
  Administered 2016-12-11: 12.5 mg via INTRAVENOUS
  Filled 2016-12-11: qty 1

## 2016-12-11 MED ORDER — BACLOFEN 10 MG PO TABS
10.0000 mg | ORAL_TABLET | Freq: Two times a day (BID) | ORAL | Status: DC
  Administered 2016-12-11 – 2016-12-13 (×5): 10 mg
  Filled 2016-12-11 (×5): qty 1

## 2016-12-11 MED ORDER — ALBUTEROL SULFATE (2.5 MG/3ML) 0.083% IN NEBU
2.5000 mg | INHALATION_SOLUTION | Freq: Four times a day (QID) | RESPIRATORY_TRACT | Status: DC
  Administered 2016-12-11 – 2016-12-13 (×8): 2.5 mg via RESPIRATORY_TRACT
  Filled 2016-12-11 (×8): qty 3

## 2016-12-11 MED ORDER — MIDAZOLAM HCL 2 MG/2ML IJ SOLN
2.0000 mg | Freq: Once | INTRAMUSCULAR | Status: AC
  Administered 2016-12-11: 2 mg via INTRAVENOUS

## 2016-12-11 MED ORDER — VITAMIN C 500 MG PO TABS
1000.0000 mg | ORAL_TABLET | Freq: Every day | ORAL | Status: DC
  Administered 2016-12-11 – 2016-12-13 (×3): 1000 mg
  Filled 2016-12-11 (×3): qty 2

## 2016-12-11 NOTE — Progress Notes (Signed)
Patient slept on and off on vent this shift. Called RT when patient's sats dropped to 84. Then called RT to suction patient since pt has been having episodes of coughing with thick secretions. Stepmother asleep at bedside. Discontinued condom cath earlier in shift because it was ineffective. Stepmother thought the day shift nurses were trying to prove a point about making the cath work, but explained to stepmother that the cath was being used in an effort to try to keep the patient dry since the bed and gown had to be changed several times on the previous shift. However, since it is not working and patient has been having heavy wetting  yesterday, staff will check patient more frequently and not use the condom cath. Stepmother voiced understanding.
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
## 2016-12-11 NOTE — Progress Notes (Signed)
Patient with sats in 80's while on vent.  SVN was given with bed percussion. Manually suctioned and lavaged patient several times due to very thick copious white tan secretions. o2 initiated during the process at 100%. Patient now back to 45% with saturation noted at 96%. Remaining vitals stable. Patient sleeping comfortably at this time

## 2016-12-11 NOTE — Plan of Care (Signed)
Problem: Respiratory: Goal: Ability to maintain a clear airway and adequate ventilation will improve Outcome: Progressing Bronchoscopy was performed at bedside by MD to remove mucous build up.

## 2016-12-11 NOTE — Progress Notes (Signed)
Pharmacy Antibiotic Note  Roger HotterJoshua Tate is a 26 y.o. male admitted on 12/09/2016 with pneumonia.  Pharmacy has been consulted for levofloxacin dosing. Patient has past medical history significant for TBI, quadriplegia, seizure disorder, and chronic respiratory failure s/p trach. Patient admitted with increased secretions and oxygen desaturations.   Plan: Patient being treated for CAP, will continue levofloxacin 500mg  IV Q24hr.   Height: 5\' 4"  (162.6 cm) Weight: 161 lb 9.6 oz (73.3 kg) IBW/kg (Calculated) : 59.2  Temp (24hrs), Avg:98.4 F (36.9 C), Min:97.5 F (36.4 C), Max:99 F (37.2 C)   Recent Labs Lab 12/09/16 1229 12/09/16 1813 12/09/16 2056 12/10/16 0439 12/11/16 0528  WBC 13.7*  --   --  13.5* 7.7  CREATININE 0.38*  --   --  0.41* 0.39*  LATICACIDVEN 3.4* 3.0* 2.5*  --   --     Estimated Creatinine Clearance: 130.5 mL/min (A) (by C-G formula based on SCr of 0.39 mg/dL (Tate)).    Allergies  Allergen Reactions  . Sulfa Antibiotics Anaphylaxis  . Zosyn [Piperacillin-Tazobactam In Dex] Anaphylaxis  . Ciprofloxacin Other (See Comments)    HYPOTENSION  . Lorazepam Other (See Comments)    Per pt's father. Reaction unknown. Diazepam and clonazepam OK  . Vancomycin   . Penicillins Rash    Antimicrobials this admission: Clindamycin 6/2 >> 6/4 Levofloxacin 6/2 >>   Dose adjustments this admission: N/A  Microbiology results: 6/2 BCx: 1/2 Staph Species 6/2 Sputum: reincubated for better growth   6/2 MRSA WUJ:[id number]:negative   Thank you for allowing pharmacy to be a part of this patient's care.  Roger Tate 12/11/2016 3:16 PM

## 2016-12-11 NOTE — Care Management (Signed)
Message left for patient's father Roger Tate [phone] to call this RNCM back. Patient appears to be from home with home health services. Trach/vent patient. RNCM will continue to follow.

## 2016-12-11 NOTE — Progress Notes (Signed)
Comfortable on ATC No distress RASS 0. Very animated and appears happy  Vitals:   12/11/16 1200 12/11/16 1300 12/11/16 1317 12/11/16 1400  BP: 131/90 102/63  (!) 139/101  Pulse: (!) 115 (!) 113  (!) 141  Resp: (!) 22 (!) 21  (!) 24  Temp:    99 F (37.2 C)  TempSrc:    Axillary  SpO2: 100% 99% 99% 98%  Weight:      Height:       NAD HEENT WNL Neck: stoma site erythematous Decreased BS on L Tachy, reg, no M NABS, soft No edema  BMP Latest Ref Rng & Units 12/11/2016 12/10/2016 12/09/2016  Glucose 65 - 99 mg/dL 161(W210(H) 960(A248(H) 540(J204(H)  BUN 6 - 20 mg/dL 6 <8(J<5(L) <1(B<5(L)  Creatinine 0.61 - 1.24 mg/dL 1.47(W0.39(L) 2.95(A0.41(L) 2.13(Y0.38(L)  Sodium 135 - 145 mmol/L 139 134(L) 123(L)  Potassium 3.5 - 5.1 mmol/L 3.7 4.0 3.6  Chloride 101 - 111 mmol/L 104 105 89(L)  CO2 22 - 32 mmol/L 27 23 24   Calcium 8.9 - 10.3 mg/dL 9.3 8.6(V8.7(L) 8.9   CBC Latest Ref Rng & Units 12/11/2016 12/10/2016 12/09/2016  WBC 3.8 - 10.6 K/uL 7.7 13.5(H) 13.7(H)  Hemoglobin 13.0 - 18.0 g/dL 12.5(L) 11.8(L) 12.9(L)  Hematocrit 40.0 - 52.0 % 35.9(L) 33.9(L) 36.1(L)  Platelets 150 - 440 K/uL 147(L) 145(L) 141(L)   CXR: L lung collapse/atelectasis with mediastinal shift to L  IMP: S/P TBI Chronic trach tube Chronic nocturnal ventilation Suspected PNA, NOS Mucus plugging with L lung collapse DM 2  PLAN/REC: Bronch today - moderate mucoid secretions suctioned from B lung. Secretions were more abundant on L Continue current abx Cont nocturnal ventilation Rest of care plan without major changes  Billy Fischeravid Maicy Filip, MD PCCM service Mobile [phone] Pager [phone] 12/11/2016 2:34 PM

## 2016-12-11 NOTE — Progress Notes (Signed)
Inpatient Diabetes Program Recommendations  AACE/ADA: New Consensus Statement on Inpatient Glycemic Control (2015)  Target Ranges:  Prepandial:   less than 140 mg/dL      Peak postprandial:   less than 180 mg/dL (1-2 hours)      Critically ill patients:  140 - 180 mg/dL   Results for Roger Tate, Roger Tate (MRN [id number]) as of 12/11/2016 09:25  Ref. Range 12/10/2016 07:53 12/10/2016 11:47 12/10/2016 16:04 12/10/2016 22:38  Glucose-Capillary Latest Ref Range: 65 - 99 mg/dL 865181 (H) 784268 (H) 696319 (H) 352 (H)   Results for Roger Tate, Roger Tate (MRN [id number]) as of 12/11/2016 09:25  Ref. Range 12/11/2016 07:00  Glucose-Capillary Latest Ref Range: 65 - 99 mg/dL 440192 (H)    Admit with: Pneumonia  History: DM, Quadriplegia, TBI  Home DM Meds: Levemir 45 units QHS       Novolog SSI  Current Insulin Orders: Levemir 45 units QHS      Novolog Sensitive Correction Scale/ SSI (0-9 units) TID AC + HS     Patient eating 75% of meals.  Having slightly elevated fasting glucose levels and significantly elevated postprandial glucose levels.   MD- Please consider the following in-hospital insulin adjustments:  1. Increase Levemir slightly to 48 units QHS  2. Start Novolog Meal Coverage: Novolog 4 units TID with meals (hold if pt eats <50% of meal)      --Will follow patient during hospitalization--  Ambrose FinlandJeannine Johnston Charle Clear RN, MSN, CDE Diabetes Coordinator Inpatient Glycemic Control Team Team Pager: [phone] (8a-5p)

## 2016-12-11 NOTE — Progress Notes (Signed)
Placed patient on vent per home settings received earlier in the day by patient's home health RN.  Setting given to covering day shift RT.  Vent set accordingly. Patient tolerating vent settings well. Patient immediately went to sleep once on vent. No distress noted

## 2016-12-11 NOTE — Progress Notes (Signed)
Routine trach tube change done. #7 portex Bivona TTS trach tube placed without difficulty. Tolerated well

## 2016-12-11 NOTE — Plan of Care (Signed)
Problem: Pain Managment: Goal: General experience of comfort will improve Outcome: Progressing Pain has been assessed multiple times this shift and appropriate measures have been taken.  Problem: Nutrition: Goal: Adequate nutrition will be maintained Outcome: Progressing Patient has eating adequately this shift, fed by step-mother. Received medications and pro-stat through "Micky" tube.

## 2016-12-11 NOTE — Progress Notes (Signed)
[place] [facility]                                                                                                                                                                                  Patient Demographics   Roger Tate, is a 26 y.o. male, DOB - Feb 02, 1991, FTD:[id number]  Admit date - 12/09/2016   Admitting Physician Gladstone Lighter, MD  Outpatient Primary MD for the patient is Soles, Howell Rucks, MD   LOS - 2  Subjective: Pt admitted with pna, Patient's mother at bedside she states that he is hungry     Review of Systems:   CONSTITUTIONAL: Unable to provide due to being on the vent   Vitals:   Vitals:   12/11/16 1200 12/11/16 1300 12/11/16 1317 12/11/16 1400  BP: 131/90 102/63  (!) 139/101  Pulse: (!) 115 (!) 113  (!) 141  Resp: (!) 22 (!) 21  (!) 24  Temp:    99 F (37.2 C)  TempSrc:    Axillary  SpO2: 100% 99% 99% 98%  Weight:      Height:        Wt Readings from Last 3 Encounters:  12/09/16 161 lb 9.6 oz (73.3 kg)  06/06/15 175 lb (79.4 kg)  03/17/15 156 lb 1.4 oz (70.8 kg)     Intake/Output Summary (Last 24 hours) at 12/11/16 1418 Last data filed at 12/11/16 1400  Gross per 24 hour  Intake              820 ml  Output              350 ml  Net              470 ml    Physical Exam:   GENERAL: Pleasant-appearing in no apparent distress.  HEAD, EYES, EARS, NOSE AND THROAT: Atraumatic, normocephalic. Extraocular muscles are intact. Pupils equal and reactive to light. Sclerae anicteric. No conjunctival injection. No oro-pharyngeal erythema.  NECK: Supple. There is no jugular venous distention. No bruits, no lymphadenopathy, no thyromegaly.  HEART: Regular rate and rhythm,. No murmurs, no rubs, no clicks.  LUNGS: decreased breath sounds bilaterally  ABDOMEN: Soft, flat, nontender, nondistended. Has good bowel sounds. No hepatosplenomegaly appreciated.  EXTREMITIES: No evidence of any cyanosis, clubbing, or  peripheral edema.  +2 pedal and radial pulses bilaterally.  NEUROLOGIC: The patient is alert, awake, and oriented x3 with no focal motor or sensory deficits appreciated bilaterally.  SKIN: Moist and warm with no rashes appreciated.  Psych: Not anxious, depressed LN: No inguinal LN enlargement    Antibiotics   Anti-infectives    Start  Dose/Rate Route Frequency Ordered Stop   12/10/16 1800  levofloxacin (LEVAQUIN) IVPB 500 mg  Status:  Discontinued     500 mg 100 mL/hr over 60 Minutes Intravenous Every 24 hours 12/09/16 1753 12/09/16 1757   12/10/16 1000  levofloxacin (LEVAQUIN) IVPB 500 mg     500 mg 100 mL/hr over 60 Minutes Intravenous Every 24 hours 12/09/16 1757     12/09/16 2200  clindamycin (CLEOCIN) capsule 300 mg  Status:  Discontinued     300 mg Oral Every 8 hours 12/09/16 1411 12/11/16 0926   12/09/16 1145  levofloxacin (LEVAQUIN) IVPB 750 mg     750 mg 100 mL/hr over 90 Minutes Intravenous  Once 12/09/16 1140 12/09/16 1439      Medications   Scheduled Meds: . acetylcysteine  2 mL Nebulization Q6H  . albuterol  2.5 mg Nebulization Q6H  . baclofen  10 mg Per Tube BID  . budesonide  0.5 mg Inhalation BID  . calcium carbonate  2.5 tablet Oral Daily  . chlorhexidine gluconate (MEDLINE KIT)  15 mL Mouth Rinse BID  . enoxaparin (LOVENOX) injection  40 mg Subcutaneous Q24H  . feeding supplement (PRO-STAT SUGAR FREE 64)  30 mL Oral Daily  . insulin aspart  0-5 Units Subcutaneous QHS  . insulin aspart  0-9 Units Subcutaneous TID WC  . insulin detemir  45 Units Subcutaneous QHS  . lactulose  20 g Oral BID  . lamoTRIgine  25 mg Per Tube Daily  . levETIRAcetam  2,000 mg Per Tube BID  . mouth rinse  15 mL Mouth Rinse QID  . mupirocin ointment   Nasal BID  . psyllium  1 packet Per Tube Daily  . ranitidine  150 mg Oral BID  . Valproate Sodium  500 mg Per Tube Q8H  . vitamin C  1,000 mg Per Tube Daily   Continuous Infusions: . levofloxacin (LEVAQUIN) IV Stopped  (12/11/16 1122)   PRN Meds:.acetaminophen **OR** acetaminophen, albuterol, bisacodyl, clonazePAM, liver oil-zinc oxide, nystatin, [DISCONTINUED] ondansetron **OR** ondansetron (ZOFRAN) IV, vitamin A & D   Data Review:   Micro Results Recent Results (from the past 240 hour(s))  Culture, blood (routine x 2)     Status: None (Preliminary result)   Collection Time: 12/09/16 12:15 PM  Result Value Ref Range Status   Specimen Description BLOOD LEFT UPPER ARM  Final   Special Requests   Final    BOTTLES DRAWN AEROBIC AND ANAEROBIC Blood Culture adequate volume   Culture  Setup Time   Final    Organism ID to follow GRAM POSITIVE COCCI AEROBIC BOTTLE ONLY CRITICAL RESULT CALLED TO, READ BACK BY AND VERIFIED WITH: JASON ROBBINS 12/10/16 AT 2121 BY HS    Culture GRAM POSITIVE COCCI  Final   Report Status PENDING  Incomplete  Blood Culture ID Panel (Reflexed)     Status: Abnormal   Collection Time: 12/09/16 12:15 PM  Result Value Ref Range Status   Enterococcus species NOT DETECTED NOT DETECTED Final   Listeria monocytogenes NOT DETECTED NOT DETECTED Final   Staphylococcus species DETECTED (A) NOT DETECTED Final    Comment: Methicillin (oxacillin) susceptible coagulase negative staphylococcus. Possible blood culture contaminant (unless isolated from more than one blood culture draw or clinical case suggests pathogenicity). No antibiotic treatment is indicated for blood  culture contaminants. CRITICAL RESULT CALLED TO, READ BACK BY AND VERIFIED WITH: JASON ROBBINS 12/10/16 AT 2121 BY HS    Staphylococcus aureus NOT DETECTED NOT DETECTED Final   Methicillin resistance  NOT DETECTED NOT DETECTED Final   Streptococcus species NOT DETECTED NOT DETECTED Final   Streptococcus agalactiae NOT DETECTED NOT DETECTED Final   Streptococcus pneumoniae NOT DETECTED NOT DETECTED Final   Streptococcus pyogenes NOT DETECTED NOT DETECTED Final   Acinetobacter baumannii NOT DETECTED NOT DETECTED Final    Enterobacteriaceae species NOT DETECTED NOT DETECTED Final   Enterobacter cloacae complex NOT DETECTED NOT DETECTED Final   Escherichia coli NOT DETECTED NOT DETECTED Final   Klebsiella oxytoca NOT DETECTED NOT DETECTED Final   Klebsiella pneumoniae NOT DETECTED NOT DETECTED Final   Proteus species NOT DETECTED NOT DETECTED Final   Serratia marcescens NOT DETECTED NOT DETECTED Final   Haemophilus influenzae NOT DETECTED NOT DETECTED Final   Neisseria meningitidis NOT DETECTED NOT DETECTED Final   Pseudomonas aeruginosa NOT DETECTED NOT DETECTED Final   Candida albicans NOT DETECTED NOT DETECTED Final   Candida glabrata NOT DETECTED NOT DETECTED Final   Candida krusei NOT DETECTED NOT DETECTED Final   Candida parapsilosis NOT DETECTED NOT DETECTED Final   Candida tropicalis NOT DETECTED NOT DETECTED Final  Culture, blood (routine x 2)     Status: None (Preliminary result)   Collection Time: 12/09/16 12:38 PM  Result Value Ref Range Status   Specimen Description BLOOD LEFT UPPER ARM  Final   Special Requests   Final    BOTTLES DRAWN AEROBIC AND ANAEROBIC Blood Culture adequate volume   Culture NO GROWTH 2 DAYS  Final   Report Status PENDING  Incomplete  Culture, respiratory (NON-Expectorated)     Status: None (Preliminary result)   Collection Time: 12/09/16  1:41 PM  Result Value Ref Range Status   Specimen Description TRACHEAL ASPIRATE  Final   Special Requests NONE  Final   Gram Stain   Final    FEW WBC PRESENT, PREDOMINANTLY PMN ABUNDANT GRAM NEGATIVE RODS FEW GRAM POSITIVE COCCI IN PAIRS RARE GRAM POSITIVE RODS    Culture   Final    CULTURE REINCUBATED FOR BETTER GROWTH Performed at [facility], [address] [place], [place] [address]    Report Status PENDING  Incomplete  MRSA PCR Screening     Status: None   Collection Time: 12/09/16  4:53 PM  Result Value Ref Range Status   MRSA by PCR NEGATIVE NEGATIVE Final    Comment:        The GeneXpert MRSA Assay  (FDA approved for NASAL specimens only), is one component of a comprehensive MRSA colonization surveillance program. It is not intended to diagnose MRSA infection nor to guide or monitor treatment for MRSA infections.     Radiology Reports Dg Chest 1 View  Result Date: 12/11/2016 CLINICAL DATA:  26 year old male with dyspnea. Quadriplegic. Brain injury. Subsequent encounter. EXAM: CHEST 1 VIEW COMPARISON:  01/06/2017 and 12/09/2016 chest x-ray. FINDINGS: Tracheostomy tube tip midline. Complete consolidation left lung with mediastinal shift to the left. This may represent presence of central obstructing lesion possibly mucous plug. Superimposed left lung infiltrate not excluded. Central pulmonary vascular prominence. Scoliosis convex left. Ventricular shunt courses over the right thorax. IMPRESSION: Complete consolidation left lung with mediastinal shift to left may represent mucous plug with left lung atelectasis. Superimposed infection not excluded. Follow-up until clearance recommended to exclude obstructing lesion. These results will be called to the ordering clinician or representative by the Radiologist Assistant, and communication documented in the PACS or zVision Dashboard. Electronically Signed   By: Genia Del M.D.   On: 12/11/2016 07:10   Dg  Chest Port 1 View  Result Date: 12/10/2016 CLINICAL DATA:  Worsening oxygen saturation.  Tracheostomy. EXAM: PORTABLE CHEST 1 VIEW COMPARISON:  12/09/2016 FINDINGS: Worsened consolidation/ collapse in the left lung. Question increasing infiltrate in the right lower lobe as well. Tracheostomy appears satisfactory. Shunt tube overlies the right chest. IMPRESSION: Worsened consolidation/ collapse of the left lung. Hazy opacity in the right lower lobe consistent with developing pneumonia. Electronically Signed   By: Nelson Chimes M.D.   On: 12/10/2016 08:03   Dg Chest Port 1 View  Result Date: 12/09/2016 CLINICAL DATA:  Fever and hypoxia. EXAM:  PORTABLE CHEST 1 VIEW COMPARISON:  06/05/2015 and prior radiographs FINDINGS: A tracheostomy tube again noted on this rotated study. Probable left lower lung airspace disease noted. There is no evidence of pneumothorax or pleural effusion. A catheter overlying the right chest is noted. IMPRESSION: Probable left lower lung airspace disease/pneumonia. Electronically Signed   By: Margarette Canada M.D.   On: 12/09/2016 11:27     CBC  Recent Labs Lab 12/09/16 1229 12/10/16 0439 12/11/16 0528  WBC 13.7* 13.5* 7.7  HGB 12.9* 11.8* 12.5*  HCT 36.1* 33.9* 35.9*  PLT 141* 145* 147*  MCV 89.6 89.5 91.0  MCH 31.9 31.2 31.7  MCHC 35.7 34.9 34.8  RDW 13.6 13.6 13.9  LYMPHSABS 0.9*  --   --   MONOABS 0.7  --   --   EOSABS 0.0  --   --   BASOSABS 0.0  --   --     Chemistries   Recent Labs Lab 12/09/16 1229 12/10/16 0439 12/11/16 0528  NA 123* 134* 139  K 3.6 4.0 3.7  CL 89* 105 104  CO2 24 23 27   GLUCOSE 204* 248* 210*  BUN <5* <5* 6  CREATININE 0.38* 0.41* 0.39*  CALCIUM 8.9 8.7* 9.3  MG  --  1.9  --   AST 45*  --   --   ALT 76*  --   --   ALKPHOS 57  --   --   BILITOT 0.9  --   --    ------------------------------------------------------------------------------------------------------------------ estimated creatinine clearance is 130.5 mL/min (A) (by C-G formula based on SCr of 0.39 mg/dL (L)). ------------------------------------------------------------------------------------------------------------------ No results for input(s): HGBA1C in the last 72 hours. ------------------------------------------------------------------------------------------------------------------ No results for input(s): CHOL, HDL, LDLCALC, TRIG, CHOLHDL, LDLDIRECT in the last 72 hours. ------------------------------------------------------------------------------------------------------------------ No results for input(s): TSH, T4TOTAL, T3FREE, THYROIDAB in the last 72 hours.  Invalid input(s):  FREET3 ------------------------------------------------------------------------------------------------------------------ No results for input(s): VITAMINB12, FOLATE, FERRITIN, TIBC, IRON, RETICCTPCT in the last 72 hours.  Coagulation profile No results for input(s): INR, PROTIME in the last 168 hours.  No results for input(s): DDIMER in the last 72 hours.  Cardiac Enzymes No results for input(s): CKMB, TROPONINI, MYOGLOBIN in the last 168 hours.  Invalid input(s): CK ------------------------------------------------------------------------------------------------------------------ Invalid input(s): [place]  is a 26 y.o. 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  #1 sepsis-secondary to community-acquired pneumonia, left lower lobe infiltrate on chest x-ray. Continue current management per pulmonary. Patient may need a repeat chest  #2 acute respiratory failure with hypoxia- Continue nebulizers and breathing treatments  #3 diabetes mellitus-insulin-dependent. Continue Levemir at bedtime. Also on sliding scale insulin currently.  #4 seizure disorder-continue  home medications. Stable on current regimen  #5 DVT prophylaxis-on Lovenox       Code Status Orders        Start     Ordered   12/09/16 1652  Full code  Continuous     12/09/16 1651    Code Status History    Date Active Date Inactive Code Status Order ID Comments User Context   03/07/2015  7:10 PM 03/18/2015  8:31 PM Full Code [id number]  Henreitta Leber, MD Inpatient   01/14/2015  5:08 PM 01/19/2015  4:00 PM Full Code [id number]  Theodoro Grist, MD Inpatient           Consults  intensivitst   DVT  Prophylaxis  Lovenox   Lab Results  Component Value Date   PLT 147 (L) 12/11/2016     Time Spent in minutes   73mn Greater than 50% of time spent in care coordination and counseling patient regarding the condition and plan of care.   PDustin FlockM.D on 12/11/2016 at 2:18 PM  Between 7am to 6pm - Pager - [phone]  After 6pm go to [email or web address] - password [email or web address] [facility]   Office  [phone]

## 2016-12-12 ENCOUNTER — Inpatient Hospital Stay: Payer: Medicaid Other

## 2016-12-12 LAB — GLUCOSE, CAPILLARY
GLUCOSE-CAPILLARY: 398 mg/dL — AB (ref 65–99)
GLUCOSE-CAPILLARY: 403 mg/dL — AB (ref 65–99)
Glucose-Capillary: 320 mg/dL — ABNORMAL HIGH (ref 65–99)
Glucose-Capillary: 401 mg/dL — ABNORMAL HIGH (ref 65–99)
Glucose-Capillary: 427 mg/dL — ABNORMAL HIGH (ref 65–99)

## 2016-12-12 LAB — CULTURE, RESPIRATORY

## 2016-12-12 LAB — CULTURE, RESPIRATORY W GRAM STAIN: Culture: NORMAL

## 2016-12-12 MED ORDER — INSULIN ASPART 100 UNIT/ML ~~LOC~~ SOLN: 0.0000 [IU] | Freq: Every day | SUBCUTANEOUS | Status: DC

## 2016-12-12 MED ORDER — INSULIN ASPART 100 UNIT/ML ~~LOC~~ SOLN
25.0000 [IU] | Freq: Once | SUBCUTANEOUS | Status: AC
  Administered 2016-12-12: 25 [IU] via SUBCUTANEOUS

## 2016-12-12 MED ORDER — LEVOFLOXACIN 500 MG PO TABS
500.0000 mg | ORAL_TABLET | Freq: Every day | ORAL | Status: DC
  Administered 2016-12-12 – 2016-12-13 (×2): 500 mg
  Filled 2016-12-12 (×2): qty 1

## 2016-12-12 MED ORDER — INSULIN ASPART 100 UNIT/ML ~~LOC~~ SOLN
0.0000 [IU] | Freq: Three times a day (TID) | SUBCUTANEOUS | Status: DC
  Administered 2016-12-12: 20 [IU] via SUBCUTANEOUS
  Administered 2016-12-13: 4 [IU] via SUBCUTANEOUS
  Filled 2016-12-12: qty 20
  Filled 2016-12-12: qty 25
  Filled 2016-12-12 (×2): qty 20
  Filled 2016-12-12: qty 4

## 2016-12-12 MED ORDER — IBUPROFEN 100 MG/5ML PO SUSP
600.0000 mg | Freq: Once | ORAL | Status: AC
  Administered 2016-12-12: 600 mg
  Filled 2016-12-12: qty 30

## 2016-12-12 MED ORDER — INSULIN ASPART 100 UNIT/ML ~~LOC~~ SOLN
20.0000 [IU] | Freq: Once | SUBCUTANEOUS | Status: AC
  Administered 2016-12-12: 20 [IU] via SUBCUTANEOUS

## 2016-12-12 MED ORDER — IBUPROFEN 100 MG/5ML PO SUSP: 600.0000 mg | Freq: Once | ORAL | Status: DC

## 2016-12-12 NOTE — Progress Notes (Signed)
Inpatient Diabetes Program Recommendations  AACE/ADA: New Consensus Statement on Inpatient Glycemic Control (2015)  Target Ranges:  Prepandial:   less than 140 mg/dL      Peak postprandial:   less than 180 mg/dL (1-2 hours)      Critically ill patients:  140 - 180 mg/dL   Results for Genia HotterGOSTO, Ares (MRN [id number]) as of 12/12/2016 09:58  Ref. Range 12/11/2016 07:00 12/11/2016 11:04 12/11/2016 16:14 12/11/2016 22:04  Glucose-Capillary Latest Ref Range: 65 - 99 mg/dL 409192 (H)  2 units Novolog 358 (H)  9 units Novolog 315 (H)  7 units Novolog 402 (H)  8 units Novolog +   45 units Levemir   Results for Genia HotterGOSTO, Davi (MRN [id number]) as of 12/12/2016 09:58  Ref. Range 12/12/2016 07:32  Glucose-Capillary Latest Ref Range: 65 - 99 mg/dL 956320 (H)  7 units Novolog    Admit with: Pneumonia  History: DM, Quadriplegia, TBI  Home DM Meds: Levemir 45 units QHS                             Novolog SSI  Current Insulin Orders: Levemir 45 units QHS                                       Novolog Sensitive Correction Scale/ SSI (0-9 units) TID AC + HS     Patient eating 75% of meals.  Having elevated fasting glucose levels and significantly elevated postprandial glucose levels.   MD- Please consider the following in-hospital insulin adjustments:  1. Increase Levemir slightly to 48 units QHS  2. Start Novolog Meal Coverage: Novolog 6 units TID with meals (hold if pt eats <50% of meal)     --Will follow patient during hospitalization--  Ambrose FinlandJeannine Johnston Apolonia Ellwood RN, MSN, CDE Diabetes Coordinator Inpatient Glycemic Control Team Team Pager: [phone] (8a-5p)

## 2016-12-12 NOTE — Care Management (Addendum)
I spoke with patient's [facility]. He and his wife are aware that patient may return home tomorrow and that I have talked to patient's home care team about this plan- he agrees. He has also been given (with permission) [facility] contact (available 9-1P) ext [phone] and that they can assist him with a 1000-2000 watt portable generator for son's/patient's home ventilator- he appreciates that and will follow up with Amity.

## 2016-12-12 NOTE — Progress Notes (Signed)
Again comfortable on ATC No distress. Pleasant mood. RASS 0  Vitals:   12/12/16 0700 12/12/16 0800 12/12/16 0900 12/12/16 1000  BP: 111/70 120/64 93/61 110/73  Pulse: 87 (!) 105 (!) 107 (!) 114  Resp: 10 12 20 19   Temp:  97.7 F (36.5 C)    TempSrc:  Oral    SpO2: 94% 97% 92% 96%  Weight:      Height:       NAD HEENT WNL Neck: Tracheostomy stoma site erythematous Few scattered rhonchi Tachy, reg, no M NABS, soft No edema  BMP Latest Ref Rng & Units 12/11/2016 12/10/2016 12/09/2016  Glucose 65 - 99 mg/dL 161(W210(H) 960(A248(H) 540(J204(H)  BUN 6 - 20 mg/dL 6 <8(J<5(L) <1(B<5(L)  Creatinine 0.61 - 1.24 mg/dL 1.47(W0.39(L) 2.95(A0.41(L) 2.13(Y0.38(L)  Sodium 135 - 145 mmol/L 139 134(L) 123(L)  Potassium 3.5 - 5.1 mmol/L 3.7 4.0 3.6  Chloride 101 - 111 mmol/L 104 105 89(L)  CO2 22 - 32 mmol/L 27 23 24   Calcium 8.9 - 10.3 mg/dL 9.3 8.6(V8.7(L) 8.9   CBC Latest Ref Rng & Units 12/11/2016 12/10/2016 12/09/2016  WBC 3.8 - 10.6 K/uL 7.7 13.5(H) 13.7(H)  Hemoglobin 13.0 - 18.0 g/dL 12.5(L) 11.8(L) 12.9(L)  Hematocrit 40.0 - 52.0 % 35.9(L) 33.9(L) 36.1(L)  Platelets 150 - 440 K/uL 147(L) 145(L) 141(L)   CXR: Much improved aeration of left lung  IMP: S/P TBI Chronic trach tube Chronic nocturnal ventilation Suspected PNA, NOS Mucus plugging with L lung collapse - improved 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 Care management assisting in planning  Billy Fischeravid Malarie Tappen, MD PCCM service Mobile [phone] Pager [phone] 12/12/2016 11:25 AM

## 2016-12-12 NOTE — Progress Notes (Signed)
Patient tolerated trach collar well today, only one episode of desaturation. Trach collar increased to 50% and patient suctioned. Saturations maintained for the remainder of shift. Will continue to assess. Trudee KusterBrandi R Tate

## 2016-12-12 NOTE — Progress Notes (Signed)
[place] at [facility]                                                                                                                                                                                  Patient Demographics   Roger Tate, is a 26 y.o. male, DOB - 05-05-1991, TAV:[id number]  Admit date - 12/09/2016   Admitting Physician Gladstone Lighter, MD  Outpatient Primary MD for the patient is Soles, Howell Rucks, MD   LOS - 3  Subjective: Patient currently denies any symptoms chest x-ray shows improvement    Review of Systems:   CONSTITUTIONAL: Unable to provide due to being on the vent   Vitals:   Vitals:   12/12/16 1100 12/12/16 1103 12/12/16 1200 12/12/16 1300  BP:  (!) 149/89 118/70 118/88  Pulse: (!) 114 (!) 109 (!) 104 (!) 116  Resp: 18 19 17  (!) 29  Temp:   98.2 F (36.8 C)   TempSrc:   Oral   SpO2: 95% 100% 92% 94%  Weight:      Height:        Wt Readings from Last 3 Encounters:  12/09/16 161 lb 9.6 oz (73.3 kg)  06/06/15 175 lb (79.4 kg)  03/17/15 156 lb 1.4 oz (70.8 kg)     Intake/Output Summary (Last 24 hours) at 12/12/16 1344 Last data filed at 12/12/16 0600  Gross per 24 hour  Intake               60 ml  Output              950 ml  Net             -890 ml    Physical Exam:   GENERAL: Pleasant-appearing in no apparent distress.  HEAD, EYES, EARS, NOSE AND THROAT: Atraumatic, normocephalic. Extraocular muscles are intact. Pupils equal and reactive to light. Sclerae anicteric. No conjunctival injection. No oro-pharyngeal erythema.  NECK: Supple. There is no jugular venous distention. No bruits, no lymphadenopathy, no thyromegaly.  HEART: Regular rate and rhythm,. No murmurs, no rubs, no clicks.  LUNGS: decreased breath sounds bilaterally  ABDOMEN: Soft, flat, nontender, nondistended. Has good bowel sounds. No hepatosplenomegaly appreciated.  EXTREMITIES: No evidence of any cyanosis, clubbing, or peripheral edema.  +2  pedal and radial pulses bilaterally.  NEUROLOGIC: The patient is alert, awake, and oriented x3 with no focal motor or sensory deficits appreciated bilaterally.  SKIN: Moist and warm with no rashes appreciated.  Psych: Not anxious, depressed LN: No inguinal LN enlargement    Antibiotics   Anti-infectives    Start     Dose/Rate Route Frequency Ordered Stop  12/12/16 1000  levofloxacin (LEVAQUIN) tablet 500 mg     500 mg Per Tube Daily 12/12/16 0902 12/17/16 0959   12/10/16 1800  levofloxacin (LEVAQUIN) IVPB 500 mg  Status:  Discontinued     500 mg 100 mL/hr over 60 Minutes Intravenous Every 24 hours 12/09/16 1753 12/09/16 1757   12/10/16 1000  levofloxacin (LEVAQUIN) IVPB 500 mg  Status:  Discontinued     500 mg 100 mL/hr over 60 Minutes Intravenous Every 24 hours 12/09/16 1757 12/12/16 0902   12/09/16 2200  clindamycin (CLEOCIN) capsule 300 mg  Status:  Discontinued     300 mg Oral Every 8 hours 12/09/16 1411 12/11/16 0926   12/09/16 1145  levofloxacin (LEVAQUIN) IVPB 750 mg     750 mg 100 mL/hr over 90 Minutes Intravenous  Once 12/09/16 1140 12/09/16 1439      Medications   Scheduled Meds: . acetylcysteine  2 mL Nebulization Q6H  . albuterol  2.5 mg Nebulization Q6H  . baclofen  10 mg Per Tube BID  . budesonide  0.5 mg Inhalation BID  . calcium carbonate  2.5 tablet Oral Daily  . chlorhexidine gluconate (MEDLINE KIT)  15 mL Mouth Rinse BID  . enoxaparin (LOVENOX) injection  40 mg Subcutaneous Q24H  . feeding supplement (PRO-STAT SUGAR FREE 64)  30 mL Oral Daily  . insulin aspart  0-20 Units Subcutaneous TID WC  . insulin aspart  0-5 Units Subcutaneous QHS  . insulin detemir  45 Units Subcutaneous QHS  . lactulose  20 g Oral BID  . lamoTRIgine  25 mg Per Tube Daily  . levETIRAcetam  2,000 mg Per Tube BID  . levofloxacin  500 mg Per Tube Daily  . mouth rinse  15 mL Mouth Rinse QID  . mupirocin ointment   Nasal BID  . psyllium  1 packet Per Tube Daily  . ranitidine  150  mg Oral BID  . Valproate Sodium  500 mg Per Tube Q8H  . vitamin C  1,000 mg Per Tube Daily   Continuous Infusions:  PRN Meds:.acetaminophen **OR** acetaminophen, albuterol, bisacodyl, clonazePAM, diphenhydrAMINE, liver oil-zinc oxide, nystatin, [DISCONTINUED] ondansetron **OR** ondansetron (ZOFRAN) IV, vitamin A & D   Data Review:   Micro Results Recent Results (from the past 240 hour(s))  Culture, blood (routine x 2)     Status: Abnormal (Preliminary result)   Collection Time: 12/09/16 12:15 PM  Result Value Ref Range Status   Specimen Description BLOOD LEFT UPPER ARM  Final   Special Requests   Final    BOTTLES DRAWN AEROBIC AND ANAEROBIC Blood Culture adequate volume   Culture  Setup Time   Final    Organism ID to follow GRAM POSITIVE COCCI AEROBIC BOTTLE ONLY CRITICAL RESULT CALLED TO, READ BACK BY AND VERIFIED WITH: JASON ROBBINS 12/10/16 AT 2121 BY HS    Culture (A)  Final    STAPHYLOCOCCUS SPECIES (COAGULASE NEGATIVE) THE SIGNIFICANCE OF ISOLATING THIS ORGANISM FROM A SINGLE SET OF BLOOD CULTURES WHEN MULTIPLE SETS ARE DRAWN IS UNCERTAIN. PLEASE NOTIFY THE MICROBIOLOGY DEPARTMENT WITHIN ONE WEEK IF SPECIATION AND SENSITIVITIES ARE REQUIRED. Performed at [facility], [place] [address], [place], [place] [address]    Report Status PENDING  Incomplete  Blood Culture ID Panel (Reflexed)     Status: Abnormal   Collection Time: 12/09/16 12:15 PM  Result Value Ref Range Status   Enterococcus species NOT DETECTED NOT DETECTED Final   Listeria monocytogenes NOT DETECTED NOT DETECTED Final   Staphylococcus  species DETECTED (A) NOT DETECTED Final    Comment: Methicillin (oxacillin) susceptible coagulase negative staphylococcus. Possible blood culture contaminant (unless isolated from more than one blood culture draw or clinical case suggests pathogenicity). No antibiotic treatment is indicated for blood  culture contaminants. CRITICAL RESULT CALLED TO, READ BACK BY AND VERIFIED  WITH: JASON ROBBINS 12/10/16 AT 2121 BY HS    Staphylococcus aureus NOT DETECTED NOT DETECTED Final   Methicillin resistance NOT DETECTED NOT DETECTED Final   Streptococcus species NOT DETECTED NOT DETECTED Final   Streptococcus agalactiae NOT DETECTED NOT DETECTED Final   Streptococcus pneumoniae NOT DETECTED NOT DETECTED Final   Streptococcus pyogenes NOT DETECTED NOT DETECTED Final   Acinetobacter baumannii NOT DETECTED NOT DETECTED Final   Enterobacteriaceae species NOT DETECTED NOT DETECTED Final   Enterobacter cloacae complex NOT DETECTED NOT DETECTED Final   Escherichia coli NOT DETECTED NOT DETECTED Final   Klebsiella oxytoca NOT DETECTED NOT DETECTED Final   Klebsiella pneumoniae NOT DETECTED NOT DETECTED Final   Proteus species NOT DETECTED NOT DETECTED Final   Serratia marcescens NOT DETECTED NOT DETECTED Final   Haemophilus influenzae NOT DETECTED NOT DETECTED Final   Neisseria meningitidis NOT DETECTED NOT DETECTED Final   Pseudomonas aeruginosa NOT DETECTED NOT DETECTED Final   Candida albicans NOT DETECTED NOT DETECTED Final   Candida glabrata NOT DETECTED NOT DETECTED Final   Candida krusei NOT DETECTED NOT DETECTED Final   Candida parapsilosis NOT DETECTED NOT DETECTED Final   Candida tropicalis NOT DETECTED NOT DETECTED Final  Culture, blood (routine x 2)     Status: None (Preliminary result)   Collection Time: 12/09/16 12:38 PM  Result Value Ref Range Status   Specimen Description BLOOD LEFT UPPER ARM  Final   Special Requests   Final    BOTTLES DRAWN AEROBIC AND ANAEROBIC Blood Culture adequate volume   Culture NO GROWTH 3 DAYS  Final   Report Status PENDING  Incomplete  Culture, respiratory (NON-Expectorated)     Status: None   Collection Time: 12/09/16  1:41 PM  Result Value Ref Range Status   Specimen Description TRACHEAL ASPIRATE  Final   Special Requests NONE  Final   Gram Stain   Final    FEW WBC PRESENT, PREDOMINANTLY PMN ABUNDANT GRAM NEGATIVE  RODS FEW GRAM POSITIVE COCCI IN PAIRS RARE GRAM POSITIVE RODS    Culture   Final    Consistent with normal respiratory flora. Performed at [facility], [place] [address], [place], [place] [address]    Report Status 12/12/2016 FINAL  Final  MRSA PCR Screening     Status: None   Collection Time: 12/09/16  4:53 PM  Result Value Ref Range Status   MRSA by PCR NEGATIVE NEGATIVE Final    Comment:        The GeneXpert MRSA Assay (FDA approved for NASAL specimens only), is one component of a comprehensive MRSA colonization surveillance program. It is not intended to diagnose MRSA infection nor to guide or monitor treatment for MRSA infections.     Radiology Reports Dg Chest 1 View  Result Date: 12/11/2016 CLINICAL DATA:  26 year old male with dyspnea. Quadriplegic. Brain injury. Subsequent encounter. EXAM: CHEST 1 VIEW COMPARISON:  01/06/2017 and 12/09/2016 chest x-ray. FINDINGS: Tracheostomy tube tip midline. Complete consolidation left lung with mediastinal shift to the left. This may represent presence of central obstructing lesion possibly mucous plug. Superimposed left lung infiltrate not excluded. Central pulmonary vascular prominence. Scoliosis convex left. Ventricular shunt courses over  the right thorax. IMPRESSION: Complete consolidation left lung with mediastinal shift to left may represent mucous plug with left lung atelectasis. Superimposed infection not excluded. Follow-up until clearance recommended to exclude obstructing lesion. These results will be called to the ordering clinician or representative by the Radiologist Assistant, and communication documented in the PACS or zVision Dashboard. Electronically Signed   By: Genia Del M.D.   On: 12/11/2016 07:10   Dg Chest Port 1 View  Result Date: 12/12/2016 CLINICAL DATA:  Respiratory failure, community acquired pneumonia, sepsis; history of quadriplegia following brain injury, diabetes, asthma EXAM: PORTABLE CHEST 1 VIEW  COMPARISON:  Portable chest x-ray of December 11, 2016 FINDINGS: Aeration of the left lung has improved. There is persistent left basilar atelectasis or pneumonia. There is probable subsegmental atelectasis at the right lung base which is stable. The heart is top-normal in size. The pulmonary vascularity is not clearly engorged. The tracheostomy tube tip lies 3.1 cm above the carina. A ventriculoperitoneal shunt tube is visible to the right of midline. IMPRESSION: Improved appearance of the left lung consistent with improving atelectasis or pneumonia. There is persistent left lower lobe density consistent with residual pneumonia. Minimal atelectasis at the right lung base is suspected. Electronically Signed   By: David  Martinique M.D.   On: 12/12/2016 07:30   Dg Chest Port 1 View  Result Date: 12/10/2016 CLINICAL DATA:  Worsening oxygen saturation.  Tracheostomy. EXAM: PORTABLE CHEST 1 VIEW COMPARISON:  12/09/2016 FINDINGS: Worsened consolidation/ collapse in the left lung. Question increasing infiltrate in the right lower lobe as well. Tracheostomy appears satisfactory. Shunt tube overlies the right chest. IMPRESSION: Worsened consolidation/ collapse of the left lung. Hazy opacity in the right lower lobe consistent with developing pneumonia. Electronically Signed   By: Nelson Chimes M.D.   On: 12/10/2016 08:03   Dg Chest Port 1 View  Result Date: 12/09/2016 CLINICAL DATA:  Fever and hypoxia. EXAM: PORTABLE CHEST 1 VIEW COMPARISON:  06/05/2015 and prior radiographs FINDINGS: A tracheostomy tube again noted on this rotated study. Probable left lower lung airspace disease noted. There is no evidence of pneumothorax or pleural effusion. A catheter overlying the right chest is noted. IMPRESSION: Probable left lower lung airspace disease/pneumonia. Electronically Signed   By: Margarette Canada M.D.   On: 12/09/2016 11:27     CBC  Recent Labs Lab 12/09/16 1229 12/10/16 0439 12/11/16 0528  WBC 13.7* 13.5* 7.7  HGB 12.9*  11.8* 12.5*  HCT 36.1* 33.9* 35.9*  PLT 141* 145* 147*  MCV 89.6 89.5 91.0  MCH 31.9 31.2 31.7  MCHC 35.7 34.9 34.8  RDW 13.6 13.6 13.9  LYMPHSABS 0.9*  --   --   MONOABS 0.7  --   --   EOSABS 0.0  --   --   BASOSABS 0.0  --   --     Chemistries   Recent Labs Lab 12/09/16 1229 12/10/16 0439 12/11/16 0528  NA 123* 134* 139  K 3.6 4.0 3.7  CL 89* 105 104  CO2 24 23 27   GLUCOSE 204* 248* 210*  BUN <5* <5* 6  CREATININE 0.38* 0.41* 0.39*  CALCIUM 8.9 8.7* 9.3  MG  --  1.9  --   AST 45*  --   --   ALT 76*  --   --   ALKPHOS 57  --   --   BILITOT 0.9  --   --    ------------------------------------------------------------------------------------------------------------------ estimated creatinine clearance is 130.5 mL/min (A) (by C-G formula based  on SCr of 0.39 mg/dL (L)). ------------------------------------------------------------------------------------------------------------------ No results for input(s): HGBA1C in the last 72 hours. ------------------------------------------------------------------------------------------------------------------ No results for input(s): CHOL, HDL, LDLCALC, TRIG, CHOLHDL, LDLDIRECT in the last 72 hours. ------------------------------------------------------------------------------------------------------------------ No results for input(s): TSH, T4TOTAL, T3FREE, THYROIDAB in the last 72 hours.  Invalid input(s): FREET3 ------------------------------------------------------------------------------------------------------------------ No results for input(s): VITAMINB12, FOLATE, FERRITIN, TIBC, IRON, RETICCTPCT in the last 72 hours.  Coagulation profile No results for input(s): INR, PROTIME in the last 168 hours.  No results for input(s): DDIMER in the last 72 hours.  Cardiac Enzymes No results for input(s): CKMB, TROPONINI, MYOGLOBIN in the last 168 hours.  Invalid input(s):  CK ------------------------------------------------------------------------------------------------------------------ Invalid input(s): [place]  is a 26 y.o. 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  #1 sepsis-secondary to community-acquired pneumonia, left lower lobe infiltrate on chest x-ray. Change to oral antibiotics per pulmonary chest x-ray shows improvement  #2 acute respiratory failure with hypoxia- Continue nebulizers and breathing treatments  #3 diabetes mellitus-insulin-dependent. Continue Levemir at bedtime. Also on sliding scale insulin currently.  #4 seizure disorder-continue home medications. Stable on current regimen  #5 DVT prophylaxis-on Lovenox  Disposition likely home tomorrow       Code Status Orders        Start     Ordered   12/09/16 1652  Full code  Continuous     12/09/16 1651    Code Status History    Date Active Date Inactive Code Status Order ID Comments User Context   03/07/2015  7:10 PM 03/18/2015  8:31 PM Full Code [id number]  Henreitta Leber, MD Inpatient   01/14/2015  5:08 PM 01/19/2015  4:00 PM Full Code [id number]  Theodoro Grist, MD Inpatient           Consults  intensivitst   DVT Prophylaxis  Lovenox   Lab Results  Component Value Date   PLT 147 (L) 12/11/2016     Time Spent in minutes   58mn Greater than 50% of time spent in care coordination and counseling patient regarding the condition and plan of care.   PDustin FlockM.D on 12/12/2016 at 1:44 PM  Between 7am to 6pm - Pager - [phone]  After 6pm go to [email or web address] - password [email or web address]   Office  [phone]

## 2016-12-12 NOTE — Progress Notes (Signed)
CBG 401, Dr. Sung AmabileSimonds notified and gave verbal order for 25 units of novolog x1. Order placed. Trudee KusterBrandi R Mansfield

## 2016-12-12 NOTE — Progress Notes (Signed)
Pharmacy Antibiotic Note  Roger Tate is a 26 y.o. male admitted on 12/09/2016 with pneumonia.  Pharmacy has been consulted for levofloxacin dosing. Patient has past medical history significant for TBI, quadriplegia, seizure disorder, and chronic respiratory failure s/p trach. Patient admitted with increased secretions and oxygen desaturations.   Plan: Patient being treated for CAP, will continue levofloxacin 500mg  PO Q24hr.    Height: 5\' 4"  (162.6 cm) Weight: 161 lb 9.6 oz (73.3 kg) IBW/kg (Calculated) : 59.2  Temp (24hrs), Avg:99.1 F (37.3 C), Min:97.7 F (36.5 C), Max:100.5 F (38.1 C)   Recent Labs Lab 12/09/16 1229 12/09/16 1813 12/09/16 2056 12/10/16 0439 12/11/16 0528  WBC 13.7*  --   --  13.5* 7.7  CREATININE 0.38*  --   --  0.41* 0.39*  LATICACIDVEN 3.4* 3.0* 2.5*  --   --     Estimated Creatinine Clearance: 130.5 mL/min (A) (by C-G formula based on SCr of 0.39 mg/dL (L)).    Allergies  Allergen Reactions  . Sulfa Antibiotics Anaphylaxis  . Zosyn [Piperacillin-Tazobactam In Dex] Anaphylaxis  . Ciprofloxacin Other (See Comments)    HYPOTENSION  . Lorazepam Other (See Comments)    Per pt's father. Reaction unknown. Diazepam and clonazepam OK  . Vancomycin   . Penicillins Rash    Antimicrobials this admission: Clindamycin 6/2 >> 6/4 Levofloxacin 6/2 >>   Dose adjustments this admission: 6/5 Levofloxacin transitioned to PO  Microbiology results: 6/2 BCx: 1/2 Staph Species 6/2 Sputum: normal respiratory flora    6/2 MRSA [id number]:negative   Thank you for allowing pharmacy to be a part of this patient's care.  Simpson,Michael L 12/12/2016 3:40 PM

## 2016-12-12 NOTE — Progress Notes (Signed)
Patient seems to be more delirious than earlier in shift, temp checked and 99.3. [place], NP notified and stated that she will round on patient shortly. Trudee KusterBrandi R Mansfield

## 2016-12-12 NOTE — Care Management (Signed)
RNCM notified by MD that plans are to discharge patient to home tomorrow. EMS packet completed. I have also [place] [facility] [place] the home health agency [phone]  and they will be ready for him however actually discharge will need to be communicated by [facility].  Orders needed for resumption of care needs to be faxed to [phone] when available. I have also spoke with patient's father at [phone] and he had a visit [place] his [place] ([facility] so we had to disconnect. I am also waiting to speak with [place] with [facility] regarding assistance with paying for portable generator. I will call father back when I have more information to share.

## 2016-12-13 LAB — CULTURE, BLOOD (ROUTINE X 2): SPECIAL REQUESTS: ADEQUATE

## 2016-12-13 LAB — GLUCOSE, CAPILLARY
GLUCOSE-CAPILLARY: 284 mg/dL — AB (ref 65–99)
Glucose-Capillary: 163 mg/dL — ABNORMAL HIGH (ref 65–99)

## 2016-12-13 MED ORDER — FENTANYL CITRATE (PF) 100 MCG/2ML IJ SOLN
INTRAMUSCULAR | Status: AC
  Filled 2016-12-13: qty 2

## 2016-12-13 MED ORDER — LEVOFLOXACIN 500 MG PO TABS: 500.0000 mg | ORAL_TABLET | Freq: Every day | ORAL | 0 refills | Status: AC

## 2016-12-13 MED ORDER — ROCURONIUM BROMIDE 50 MG/5ML IV SOLN
INTRAVENOUS | Status: AC
  Filled 2016-12-13: qty 1

## 2016-12-13 MED ORDER — ACETYLCYSTEINE 20 % IN SOLN: 2.0000 mL | Freq: Four times a day (QID) | RESPIRATORY_TRACT | 12 refills | Status: AC

## 2016-12-13 NOTE — Care Management (Signed)
Home care agency ready to accept patient and so is patient's mother. RN can call EMS for transport. RNCM will fax discharge summary to care agency. No other RNCM needs.

## 2016-12-13 NOTE — Discharge Summary (Signed)
[facility] - [place] at [facility], [place] y.o., DOB 1991-06-19, MRN [id number]. Admission date: 12/09/2016 Discharge Date 12/13/2016 Primary MD Soles, Willaim Rayas, MD Admitting Physician Enid Baas, MD  Admission Diagnosis  Pneumonia [J18.9] Sepsis, due to unspecified organism [facility]) [A41.9] Community acquired pneumonia of left lower lobe of lung (HCC) [J18.1]  Discharge Diagnosis   Active Problems:   Sepsis (HCC)   Community acquired pneumonia of left lower lobe of lung (HCC)   Acute respiratory failure with hypoxia   Diabetes type 2   Seizure disorder   History of brain injury   Chronic trach   Quadriplegia          Hospital Course Roger Tate  is a 26 y.o. 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 Patient was noted to have acute pneumonia. He was treated with IV antibiotics. He does have a intermittent ventilator at home which he was placed during the hospitalization. He was treated with nebulizers and mucolytics with significant improvement in his symptoms. He also underwent a bronchoscopy. The sputum culture shows mixed respiratory flora. Patient's breathing is much improved currently back to baseline. And is stable for discharge.            Consults  pulmonary/intensive care  Significant Tests:  See full reports for all details     Dg Chest 1 View  Result Date: 12/11/2016 CLINICAL DATA:  26 year old male with dyspnea. Quadriplegic. Brain injury. Subsequent encounter. EXAM: CHEST 1 VIEW COMPARISON:  01/06/2017 and 12/09/2016 chest x-ray. FINDINGS: Tracheostomy tube tip midline. Complete consolidation left lung with mediastinal shift to the left. This may represent  presence of central obstructing lesion possibly mucous plug. Superimposed left lung infiltrate not excluded. Central pulmonary vascular prominence. Scoliosis convex left. Ventricular shunt courses over the right thorax. IMPRESSION: Complete consolidation left lung with mediastinal shift to left may represent mucous plug with left lung atelectasis. Superimposed infection not excluded. Follow-up until clearance recommended to exclude obstructing lesion. These results will be called to the ordering clinician or representative by the Radiologist Assistant, and communication documented in the PACS or zVision Dashboard. Electronically Signed   By: Lacy Duverney M.D.   On: 12/11/2016 07:10   Dg Chest Port 1 View  Result Date: 12/12/2016 CLINICAL DATA:  Respiratory failure, community acquired pneumonia, sepsis; history of quadriplegia following brain injury, diabetes, asthma EXAM: PORTABLE CHEST 1 VIEW COMPARISON:  Portable chest x-ray of December 11, 2016 FINDINGS: Aeration of the left lung has improved. There is persistent left basilar atelectasis or pneumonia. There is probable subsegmental atelectasis at the right lung base which is stable. The heart is top-normal in size. The pulmonary vascularity is not clearly engorged. The tracheostomy tube tip lies 3.1 cm above the carina. A ventriculoperitoneal shunt tube is visible to the right of midline. IMPRESSION: Improved appearance of the left lung consistent with improving atelectasis or pneumonia. There is persistent left lower lobe density consistent with residual pneumonia. Minimal atelectasis at the right lung base is suspected. Electronically Signed   By: David  [place]az[place] M.D.   On: 12/12/2016 07:30   Dg Chest Port 1 View  Result Date: 12/10/2016 CLINICAL DATA:  Worsening oxygen saturation.  Tracheostomy. EXAM: PORTABLE CHEST 1 VIEW COMPARISON:  12/09/2016 FINDINGS: Worsened consolidation/ collapse in the left lung. Question  increasing infiltrate in the right lower lobe  as well. Tracheostomy appears satisfactory. Shunt tube overlies the right chest. IMPRESSION: Worsened consolidation/ collapse of the left lung. Hazy opacity in the right lower lobe consistent with developing pneumonia. Electronically Signed   By: Paulina FusiMark  Shogry M.D.   On: 12/10/2016 08:03   Dg Chest Port 1 View  Result Date: 12/09/2016 CLINICAL DATA:  Fever and hypoxia. EXAM: PORTABLE CHEST 1 VIEW COMPARISON:  06/05/2015 and prior radiographs FINDINGS: A tracheostomy tube again noted on this rotated study. Probable left lower lung airspace disease noted. There is no evidence of pneumothorax or pleural effusion. A catheter overlying the right chest is noted. IMPRESSION: Probable left lower lung airspace disease/pneumonia. Electronically Signed   By: Harmon PierJeffrey  Hu M.D.   On: 12/09/2016 11:27       Today   Subjective:   Roger HotterJoshua Tate  patient currently in good spirit has no complaints  Objective:   Blood pressure (!) 139/98, pulse 98, temperature 98.4 F (36.9 C), temperature source Oral, resp. rate 19, height 5\' 4"  (1.626 m), weight 161 lb 9.6 oz (73.3 kg), SpO2 97 %.  .  Intake/Output Summary (Last 24 hours) at 12/13/16 1408 Last data filed at 12/12/16 2310  Gross per 24 hour  Intake              420 ml  Output             1600 ml  Net            -1180 ml    Exam VITAL SIGNS: Blood pressure (!) 139/98, pulse 98, temperature 98.4 F (36.9 C), temperature source Oral, resp. rate 19, height 5\' 4"  (1.626 m), weight 161 lb 9.6 oz (73.3 kg), SpO2 97 %.  GENERAL:  26 y.o.-year-old patient lying in the bed with no acute distress.  EYES: Pupils equal, round, reactive to light and accommodation. No scleral icterus. Extraocular muscles intact.  HEENT: Head atraumatic, normocephalic. Oropharynx and nasopharynx clear.  NECK:  Supple, no jugular venous distention. No thyroid enlargement, no tenderness.  LUNGS: Normal breath sounds bilaterally, no wheezing, rales,rhonchi or crepitation. No use of  accessory muscles of respiration.  CARDIOVASCULAR: S1, S2 normal. No murmurs, rubs, or gallops.  ABDOMEN: Soft, nontender, nondistended. Bowel sounds present. No organomegaly or mass.  EXTREMITIES: No pedal edema, cyanosis, or clubbing.  NEUROLOGIC: Patient is contracted, [place] [employer] [place] much need to use my neck that PSYCHIATRIC: The patient is alert and oriented x 3.  SKIN: No obvious rash, lesion, or ulcer.   Data Review     CBC w Diff: Lab Results  Component Value Date   WBC 7.7 12/11/2016   HGB 12.5 (L) 12/11/2016   HCT 35.9 (L) 12/11/2016   PLT 147 (L) 12/11/2016   LYMPHOPCT 7 12/09/2016   MONOPCT 5 12/09/2016   EOSPCT 0 12/09/2016   BASOPCT 0 12/09/2016   CMP: Lab Results  Component Value Date   NA 139 12/11/2016   K 3.7 12/11/2016   CL 104 12/11/2016   CO2 27 12/11/2016   BUN 6 12/11/2016   CREATININE 0.39 (L) 12/11/2016   PROT 7.7 12/09/2016   ALBUMIN 3.6 12/09/2016   BILITOT 0.9 12/09/2016   ALKPHOS 57 12/09/2016   AST 45 (H) 12/09/2016   ALT 76 (H) 12/09/2016  .  Micro Results Recent Results (from the past 240 hour(s))  Culture, blood (routine x 2)     Status: Abnormal   Collection Time: 12/09/16 12:15 PM  Result Value Ref Range Status   Specimen Description BLOOD LEFT UPPER ARM  Final   Special Requests   Final    BOTTLES DRAWN AEROBIC AND ANAEROBIC Blood Culture adequate volume   Culture  Setup Time   Final    Organism ID to follow GRAM POSITIVE COCCI AEROBIC BOTTLE ONLY CRITICAL RESULT CALLED TO, READ BACK BY AND VERIFIED WITH: JASON ROBBINS 12/10/16 AT 2121 BY HS    Culture (A)  Final    STAPHYLOCOCCUS SPECIES (COAGULASE NEGATIVE) THE SIGNIFICANCE OF ISOLATING THIS ORGANISM FROM A SINGLE SET OF BLOOD CULTURES WHEN MULTIPLE SETS ARE DRAWN IS UNCERTAIN. PLEASE NOTIFY THE MICROBIOLOGY DEPARTMENT WITHIN ONE WEEK IF SPECIATION AND SENSITIVITIES ARE REQUIRED. Performed at [facility], [address] [place], [place] [address]    Report  Status 12/13/2016 FINAL  Final  Blood Culture ID Panel (Reflexed)     Status: Abnormal   Collection Time: 12/09/16 12:15 PM  Result Value Ref Range Status   Enterococcus species NOT DETECTED NOT DETECTED Final   Listeria monocytogenes NOT DETECTED NOT DETECTED Final   Staphylococcus species DETECTED (A) NOT DETECTED Final    Comment: Methicillin (oxacillin) susceptible coagulase negative staphylococcus. Possible blood culture contaminant (unless isolated from more than one blood culture draw or clinical case suggests pathogenicity). No antibiotic treatment is indicated for blood  culture contaminants. CRITICAL RESULT CALLED TO, READ BACK BY AND VERIFIED WITH: JASON ROBBINS 12/10/16 AT 2121 BY HS    Staphylococcus aureus NOT DETECTED NOT DETECTED Final   Methicillin resistance NOT DETECTED NOT DETECTED Final   Streptococcus species NOT DETECTED NOT DETECTED Final   Streptococcus agalactiae NOT DETECTED NOT DETECTED Final   Streptococcus pneumoniae NOT DETECTED NOT DETECTED Final   Streptococcus pyogenes NOT DETECTED NOT DETECTED Final   Acinetobacter baumannii NOT DETECTED NOT DETECTED Final   Enterobacteriaceae species NOT DETECTED NOT DETECTED Final   Enterobacter cloacae complex NOT DETECTED NOT DETECTED Final   Escherichia coli NOT DETECTED NOT DETECTED Final   Klebsiella oxytoca NOT DETECTED NOT DETECTED Final   Klebsiella pneumoniae NOT DETECTED NOT DETECTED Final   Proteus species NOT DETECTED NOT DETECTED Final   Serratia marcescens NOT DETECTED NOT DETECTED Final   Haemophilus influenzae NOT DETECTED NOT DETECTED Final   Neisseria meningitidis NOT DETECTED NOT DETECTED Final   Pseudomonas aeruginosa NOT DETECTED NOT DETECTED Final   Candida albicans NOT DETECTED NOT DETECTED Final   Candida glabrata NOT DETECTED NOT DETECTED Final   Candida krusei NOT DETECTED NOT DETECTED Final   Candida parapsilosis NOT DETECTED NOT DETECTED Final   Candida tropicalis NOT DETECTED NOT  DETECTED Final  Culture, blood (routine x 2)     Status: None (Preliminary result)   Collection Time: 12/09/16 12:38 PM  Result Value Ref Range Status   Specimen Description BLOOD LEFT UPPER ARM  Final   Special Requests   Final    BOTTLES DRAWN AEROBIC AND ANAEROBIC Blood Culture adequate volume   Culture NO GROWTH 4 DAYS  Final   Report Status PENDING  Incomplete  Culture, respiratory (NON-Expectorated)     Status: None   Collection Time: 12/09/16  1:41 PM  Result Value Ref Range Status   Specimen Description TRACHEAL ASPIRATE  Final   Special Requests NONE  Final   Gram Stain   Final    FEW WBC PRESENT, PREDOMINANTLY PMN ABUNDANT GRAM NEGATIVE RODS FEW GRAM POSITIVE COCCI IN PAIRS RARE GRAM POSITIVE RODS    Culture   Final  Consistent with normal respiratory flora. Performed at [facility], [address] [place], [place] [address]    Report Status 12/12/2016 FINAL  Final  MRSA PCR Screening     Status: None   Collection Time: 12/09/16  4:53 PM  Result Value Ref Range Status   MRSA by PCR NEGATIVE NEGATIVE Final    Comment:        The GeneXpert MRSA Assay (FDA approved for NASAL specimens only), is one component of a comprehensive MRSA colonization surveillance program. It is not intended to diagnose MRSA infection nor to guide or monitor treatment for MRSA infections.         Code Status Orders        Start     Ordered   12/09/16 1652  Full code  Continuous     12/09/16 1651    Code Status History    Date Active Date Inactive Code Status Order ID Comments User Context   03/07/2015  7:10 PM 03/18/2015  8:31 PM Full Code [id number]  Houston Siren, MD Inpatient   01/14/2015  5:08 PM 01/19/2015  4:00 PM Full Code [id number]  Katharina Caper, MD Inpatient          Follow-up Information    Soles, Willaim Rayas, MD Follow up in 1 week(s).   Specialty:  Family Medicine Contact information: [address] [place]  [address] [phone]        Judeen Hammans, MD In 1 week.   Specialty:  Family Medicine Contact information: [address] [place] [address] [phone]        Judeen Hammans, MD In 1 week.   Specialty:  Family Medicine Why:  [facility] December 20, 2016 1540PM ARRIVE AT 1525PM.  Contact information: [address] [place] [address] [phone]           Discharge Medications   Allergies as of 12/13/2016      Reactions   Sulfa Antibiotics Anaphylaxis   Zosyn [piperacillin-tazobactam In Dex] Anaphylaxis   Ciprofloxacin Other (See Comments)   HYPOTENSION   Lorazepam Other (See Comments)   Per pt's father. Reaction unknown. Diazepam and clonazepam OK   Vancomycin    Penicillins Rash      Medication List    TAKE these medications   acetaminophen 160 MG/5ML elixir Commonly known as:  TYLENOL Take 640 mg by mouth every 4 (four) hours as needed for fever.   acetylcysteine 20 % nebulizer solution Commonly known as:  MUCOMYST Take 2 mLs by nebulization every 6 (six) hours.   albuterol (2.5 MG/3ML) 0.083% nebulizer solution Commonly known as:  PROVENTIL Inhale 3 mLs into the lungs 4 (four) times daily as needed.   Ascorbic Acid 500 MG Chew Chew 1,000 mg by mouth daily.   baclofen 10 MG tablet Commonly known as:  LIORESAL Take 1 tablet by mouth 2 (two) times daily.   brompheniramine-pseudoephedrine-dextromethorphan 15-1-5 MG/5ML Elix Commonly known as:  DIMETAPP DM Take 10 mLs by mouth every 6 (six) hours as needed.   calcium carbonate (dosed in mg elemental calcium) 1250 MG/5ML Susp Take 500 mg of elemental calcium by mouth daily.   cetirizine 10 MG tablet Commonly known as:  ZYRTEC Take 10 mg by mouth daily.   clonazePAM 1 MG tablet Commonly known as:  KLONOPIN Take 1 mg by mouth as needed for anxiety (for 3 or more seizures within an hour.).   diphenhydrAMINE 25 mg capsule Commonly known  as:  BENADRYL Take 25 mg by  mouth daily as needed (to pretreat antibiotics.).   EPINEPHrine 0.3 mg/0.3 mL Soaj injection Commonly known as:  EPI-PEN Inject 0.3 mg into the muscle as needed.   feeding supplement (PRO-STAT SUGAR FREE 64) Liqd Take 30 mLs by mouth daily.   FIBERCHOICE 2 g Chew Generic drug:  Inulin Chew 2 tablets by mouth daily.   GLUCAGON EMERGENCY 1 MG injection Generic drug:  glucagon Inject 1 mg into the muscle as needed.   guaiFENesin 100 MG/5ML Soln Commonly known as:  ROBITUSSIN Take 5 mLs by mouth every 4 (four) hours as needed for cough or to loosen phlegm.   insulin detemir 100 UNIT/ML injection Commonly known as:  LEVEMIR Inject 45 Units into the skin at bedtime.   lactulose 10 GM/15ML solution Commonly known as:  CHRONULAC Take 30 mLs by mouth 2 (two) times daily.   lamoTRIgine 25 MG tablet Commonly known as:  LAMICTAL Take 25 mg by mouth daily. Pt is tapering up starting at 25mg  for 2 weeks, then pt will take 1 tab bid for 2 weeks, then pt will take 1 tab qam and 2 tabs qpm for 2 weeks, then 2 tabs bid for 2 weeks, then 50mg  qam and 75 mg qpm for 2 weeks, then 75mg  bid for 2 weeks, then 100mg  qam and 75 qpm for 2 weeks, then 100mg  bid for 2 weeks, then 125mg  qam and 100mg  qpm for 2 weeks, then 125mg  bid   LAXATIVE 10 MG suppository Generic drug:  bisacodyl Place 1 suppository rectally at bedtime as needed for mild constipation, moderate constipation or severe constipation.   levETIRAcetam 100 MG/ML solution Commonly known as:  KEPPRA Place 2,000 mg into feeding tube 2 (two) times daily.   levofloxacin 500 MG tablet Commonly known as:  LEVAQUIN Place 1 tablet (500 mg total) into feeding tube daily.   mupirocin cream 2 % Commonly known as:  BACTROBAN Apply 1 application topically as needed.   NOVOLOG 100 UNIT/ML injection Generic drug:  insulin aspart Inject 8 Units into the skin as needed for high blood sugar. Prn before meals for blood glucose greater than 200    nystatin powder Generic drug:  nystatin Apply 1 g topically 2 (two) times daily as needed.   PEDIATRIC MULTIPLE VITAMINS PO Take 2 tablets by mouth daily.   PULMICORT 0.5 MG/2ML nebulizer solution Generic drug:  budesonide Inhale 1 vial into the lungs 2 (two) times daily.   ranitidine 150 MG/10ML syrup Commonly known as:  ZANTAC Take 10 mLs by mouth 2 (two) times daily.   sodium chloride irrigation 0.9 % irrigation Irrigate with as directed as needed.   triamcinolone lotion 0.1 % Commonly known as:  KENALOG Apply 1 application topically 2 (two) times daily as needed.   Valproic Acid 250 MG/5ML Syrp syrup Commonly known as:  DEPAKENE Take 10 mLs by mouth every 8 (eight) hours.   vitamin A & D ointment Apply 1 application topically as needed for dry skin.   zinc oxide 20 % ointment Apply 1 application topically 2 (two) times daily as needed for irritation.          Total Time in preparing paper work, data evaluation and todays exam - 35 minutes  Auburn Bilberry M.D on 12/13/2016 at 2:08 [facility]  [facility]   Office  [phone]

## 2016-12-13 NOTE — Discharge Instructions (Addendum)
[facility] - [place] at [facility]  DIET:  Regular diet as before  DISCHARGE CONDITION:  Stable  ACTIVITY:  Bedrest  OXYGEN:  Home Oxygen: Yes.     Oxygen Delivery:  As doing previously , vent use as previously  DISCHARGE LOCATION:  home   RESUME previous Home Health care  ADDITIONAL DISCHARGE INSTRUCTION: please continue all pre hosptilaztion care as before   If you experience worsening of your admission symptoms, develop shortness of breath, life threatening emergency, suicidal or homicidal thoughts you must seek medical attention immediately by calling 911 or calling your MD immediately  if symptoms less severe.  You Must read complete instructions/literature along with all the possible adverse reactions/side effects for all the Medicines you take and that have been prescribed to you. Take any new Medicines after you have completely understood and accpet all the possible adverse reactions/side effects.   Please note  You were cared for by a hospitalist during your hospital stay. If you have any questions about your discharge medications or the care you received while you were in the hospital after you are discharged, you can call the unit and asked to speak with the hospitalist on call if the hospitalist that took care of you is not available. Once you are discharged, your primary care physician will handle any further medical issues. Please note that NO REFILLS for any discharge medications will be authorized once you are discharged, as it is imperative that you return to your primary care physician (or establish a relationship with a primary care physician if you do not have one) for your aftercare needs so that they can reassess your need for medications and monitor your lab values.

## 2016-12-13 NOTE — Care Management (Signed)
This RNCM received call from Case Manager with [facility] Alanson AlyJulie Porter [phone]. Patient's PCP is Dr. Sallee LangeSoles also with [facility]. Raynelle FanningJulie would like to talk to this [facility] regarding "concern for patient in current living environment". This RNCM spoke with ICU RN- patient has not wounds/bedsores, not malnourished, and no signs of bruising or abuse. Patient's mother has visited with patient while in the hospital. Patient's [facility] is [facility] bypass [facility]. I have attempted to contact Raynelle FanningJulie to discuss these concerns however she did not answer- message left. I have updated CSW of this call.

## 2016-12-13 NOTE — Care Management (Addendum)
Discharge order is in however I cannot reach neither patient's mother or father by number's listed. I have text patient's father. Message also left for caergiver  [phone]; I will fax discharge summary to [phone].

## 2016-12-13 NOTE — Care Management (Signed)
I spoke with case manager Raynelle FanningJulie with patient's PCP. Her concern is that his blood sugars are elevated and both mother and father have been educated on medication administrated and not following diet for diabetic. I explained that patient's blood sugar is still elevated in 200's even during hospitalization/a controlled environment. She states she receives calls from home health nurse Maurine Minister(D[place]) often about these concerns. I asked Raynelle FanningJulie to assist with following patient in the outpatient setting. I explained that patient presented with collapsed lung this visit however after talking with bedside nurse I don't see any reason for concern at this time- she agreed. RNCM still trying to reach home health agency and parents- [facility]. SOMEONE HAS TO BE AT THE HOME TO RECEIVE PATIENT FROM EMS before he can discharge.

## 2016-12-13 NOTE — Progress Notes (Signed)
Transport called for patient. Discharge completed. Family updated. ETA within the hour. Trudee KusterBrandi R Mansfield

## 2016-12-13 NOTE — Progress Notes (Signed)
Patient d/c'd at 1155. Transported by EMS to residence. Step mother at bedside and updated on plan of care. Follow up appointment made and prescriptions sent to pharmacy. Discharge education provided to step mother, no further questions. Trudee KusterBrandi R Mansfield

## 2016-12-14 LAB — CULTURE, BLOOD (ROUTINE X 2)
Culture: NO GROWTH
Special Requests: ADEQUATE

## 2016-12-16 NOTE — Procedures (Addendum)
Bronchoscopy:  Indication: Mucus plugging, left lung collapse Sedation: Midazolam 2 mg IV Procedure: The tracheostomy tube was removed (as the bronchoscope could not be advanced through the tube due to its diameter). The bronchoscope was advanced through the tracheal stoma and a cursory airway examination was performed which revealed significant secretions in the left lung and to a lesser extent in the right lung. These were successfully suctioned. After the airways were cleaned out, airway examination revealed no endobronchial tumors masses or foreign bodies.  Roger Fischeravid Ashliegh Parekh, MD PCCM service Mobile [phone] Pager [phone] 12/16/2016 1:09 PM

## 2017-09-10 DIAGNOSIS — N3946 Mixed incontinence: Secondary | ICD-10-CM | POA: Insufficient documentation

## 2017-09-11 ENCOUNTER — Encounter: Payer: Self-pay | Admitting: Gastroenterology

## 2017-09-11 LAB — UNMAPPED LAB RESULTS
Basophil # (HT): 0 10 3/uL — NL (ref 0.0–0.2)
Basophil % (HT): 0 % — NL (ref 0–3)
Eosinophil # (HT): 0 10 3/uL — NL (ref 0.0–0.6)
Eosinophil % (HT): 0 % — NL (ref 0–5)
Hematocrit (HT): 42 % — NL (ref 40–52)
Hemoglobin (HGB) (HT): 14.1 g/dL — NL (ref 13.0–18.0)
Lymphocyte # (HT): 2.5 10 3/uL — NL (ref 1.0–4.8)
Lymphocyte % (HT): 19 % — NL (ref 15–45)
MCHC (HT): 33.9 g/dL — NL (ref 32.0–37.5)
MCV (HT): 93 fL — NL (ref 80–100)
Mean Corpuscular Hemoglobin (MCH) (HT): 31.6 pg — NL (ref 26.0–34.0)
Monocyte # (HT): 0.8 10 3/uL — NL (ref 0.1–1.0)
Monocyte % (HT): 6 % — NL (ref 0–15)
Neutrophil # (HT): 9.8 10 3/uL — ABNORMAL HIGH (ref 1.8–8.0)
Platelets (HT): 156 10 3/uL — NL (ref 150–450)
RBC (HT): 4.46 10 6/uL — NL (ref 4.40–6.20)
RDW (HT): 13 % — NL (ref 0.0–15.2)
Seg Neut % (HT): 75 % — NL (ref 45–75)
WBC (HT): 13.2 10 3/uL — ABNORMAL HIGH (ref 4.0–11.0)

## 2017-09-12 LAB — UNMAPPED LAB RESULTS
Basophil # (HT): 0 10 3/uL — NL (ref 0.0–0.2)
Basophil % (HT): 0 % — NL (ref 0–3)
Eosinophil # (HT): 0.1 10 3/uL — NL (ref 0.0–0.6)
Eosinophil % (HT): 1 % — NL (ref 0–5)
Hematocrit (HT): 36 % — ABNORMAL LOW (ref 40–52)
Hemoglobin (HGB) (HT): 12.4 g/dL — ABNORMAL LOW (ref 13.0–18.0)
Lymphocyte # (HT): 2.6 10 3/uL — NL (ref 1.0–4.8)
Lymphocyte % (HT): 26 % — NL (ref 15–45)
MCHC (HT): 34.3 g/dL — NL (ref 32.0–37.5)
MCV (HT): 92 fL — NL (ref 80–100)
Mean Corpuscular Hemoglobin (MCH) (HT): 31.5 pg — NL (ref 26.0–34.0)
Monocyte # (HT): 0.7 10 3/uL — NL (ref 0.1–1.0)
Monocyte % (HT): 7 % — NL (ref 0–15)
Neutrophil # (HT): 6.5 10 3/uL — NL (ref 1.8–8.0)
Platelets (HT): 148 10 3/uL — ABNORMAL LOW (ref 150–450)
RBC (HT): 3.94 10 6/uL — ABNORMAL LOW (ref 4.40–6.20)
RDW (HT): 13 % — NL (ref 0.0–15.2)
Seg Neut % (HT): 66 % — NL (ref 45–75)
WBC (HT): 9.8 10 3/uL — NL (ref 4.0–11.0)

## 2017-09-13 LAB — UNMAPPED LAB RESULTS
Hematocrit (HT): 35 % — ABNORMAL LOW (ref 40–52)
Hemoglobin (HGB) (HT): 12.2 g/dL — ABNORMAL LOW (ref 13.0–18.0)
MCHC (HT): 35 g/dL — NL (ref 32.0–37.5)
MCV (HT): 91 fL — NL (ref 80–100)
Mean Corpuscular Hemoglobin (MCH) (HT): 31.9 pg — NL (ref 26.0–34.0)
RBC (HT): 3.83 10 6/uL — ABNORMAL LOW (ref 4.40–6.20)
RDW (HT): 12.7 % — NL (ref 0.0–15.2)
WBC (HT): 7.5 10 3/uL — NL (ref 4.0–11.0)

## 2017-09-14 LAB — UNMAPPED LAB RESULTS
Hematocrit (HT): 34 % — ABNORMAL LOW (ref 40–52)
Hemoglobin (HGB) (HT): 11.9 g/dL — ABNORMAL LOW (ref 13.0–18.0)
MCHC (HT): 34.6 g/dL — NL (ref 32.0–37.5)
MCV (HT): 91 fL — NL (ref 80–100)
Mean Corpuscular Hemoglobin (MCH) (HT): 31.5 pg — NL (ref 26.0–34.0)
Platelets (HT): 157 10 3/uL — NL (ref 150–450)
RBC (HT): 3.78 10 6/uL — ABNORMAL LOW (ref 4.40–6.20)
RDW (HT): 12.5 % — NL (ref 0.0–15.2)
WBC (HT): 8.2 10 3/uL — NL (ref 4.0–11.0)

## 2017-09-15 LAB — UNMAPPED LAB RESULTS
Hematocrit (HT): 32 % — ABNORMAL LOW (ref 40–52)
Hemoglobin (HGB) (HT): 11.4 g/dL — ABNORMAL LOW (ref 13.0–18.0)
MCHC (HT): 35.2 g/dL — NL (ref 32.0–37.5)
MCV (HT): 89 fL — NL (ref 80–100)
Mean Corpuscular Hemoglobin (MCH) (HT): 31.4 pg — NL (ref 26.0–34.0)
Platelets (HT): 133 10 3/uL — ABNORMAL LOW (ref 150–450)
RBC (HT): 3.63 10 6/uL — ABNORMAL LOW (ref 4.40–6.20)
RDW (HT): 12.6 % — NL (ref 0.0–15.2)
WBC (HT): 8.6 10 3/uL — NL (ref 4.0–11.0)

## 2017-09-16 LAB — UNMAPPED LAB RESULTS
Hematocrit (HT): 32 % — ABNORMAL LOW (ref 40–52)
Hemoglobin (HGB) (HT): 11.6 g/dL — ABNORMAL LOW (ref 13.0–18.0)
MCHC (HT): 36.4 g/dL — NL (ref 32.0–37.5)
MCV (HT): 89 fL — NL (ref 80–100)
Mean Corpuscular Hemoglobin (MCH) (HT): 32.2 pg — NL (ref 26.0–34.0)
Platelets (HT): 172 10 3/uL — NL (ref 150–450)
RBC (HT): 3.6 10 6/uL — ABNORMAL LOW (ref 4.40–6.20)
RDW (HT): 12.5 % — NL (ref 0.0–15.2)
WBC (HT): 8.2 10 3/uL — NL (ref 4.0–11.0)

## 2017-09-17 LAB — UNMAPPED LAB RESULTS
Hematocrit (HT): 34 % — ABNORMAL LOW (ref 40–52)
Hemoglobin (HGB) (HT): 12.4 g/dL — ABNORMAL LOW (ref 13.0–18.0)
MCHC (HT): 36.2 g/dL — NL (ref 32.0–37.5)
MCV (HT): 88 fL — NL (ref 80–100)
Mean Corpuscular Hemoglobin (MCH) (HT): 31.7 pg — NL (ref 26.0–34.0)
Platelets (HT): 179 10 3/uL — NL (ref 150–450)
RBC (HT): 3.91 10 6/uL — ABNORMAL LOW (ref 4.40–6.20)
RDW (HT): 12.7 % — NL (ref 0.0–15.2)
WBC (HT): 9.9 10 3/uL — NL (ref 4.0–11.0)

## 2017-09-18 ENCOUNTER — Telehealth: Payer: Self-pay

## 2017-09-18 LAB — UNMAPPED LAB RESULTS
Hematocrit (HT): 31 % — ABNORMAL LOW (ref 40–52)
Hemoglobin (HGB) (HT): 11 g/dL — ABNORMAL LOW (ref 13.0–18.0)
MCHC (HT): 35.7 g/dL — NL (ref 32.0–37.5)
MCV (HT): 91 fL — NL (ref 80–100)
Mean Corpuscular Hemoglobin (MCH) (HT): 32.4 pg — NL (ref 26.0–34.0)
Platelets (HT): 140 10 3/uL — ABNORMAL LOW (ref 150–450)
RBC (HT): 3.39 10 6/uL — ABNORMAL LOW (ref 4.40–6.20)
RDW (HT): 13.6 % — NL (ref 0.0–15.2)
WBC (HT): 15 10 3/uL — ABNORMAL HIGH (ref 4.0–11.0)

## 2017-09-18 NOTE — Telephone Encounter (Signed)
Records received requesting Pulmonary consult.  Patient has a history of asthma and recurring pneumonia.  Traumatic brain injury at age 89.  History of trach, oxygen dependent at night, 1 ltr.    Spoke with patients Dad who noted that [facility] and he is not sure when he will be discharged.  Dad is in hospital as well s/p leg amputation.  His hope is to get Ivin BootyJoshua and himself [facility] for [facility] and follow up care for [place]ua.  He does not wish to schedule an appointment at this time.  [facility]  Records sent to scanning.

## 2017-09-19 LAB — UNMAPPED LAB RESULTS
Hematocrit (HT): 29 % — ABNORMAL LOW (ref 40–52)
Hemoglobin (HGB) (HT): 10.2 g/dL — ABNORMAL LOW (ref 13.0–18.0)
MCHC (HT): 34.7 g/dL — NL (ref 32.0–37.5)
MCV (HT): 92 fL — NL (ref 80–100)
Mean Corpuscular Hemoglobin (MCH) (HT): 31.8 pg — NL (ref 26.0–34.0)
Platelets (HT): 162 10 3/uL — NL (ref 150–450)
RBC (HT): 3.21 10 6/uL — ABNORMAL LOW (ref 4.40–6.20)
RDW (HT): 13.7 % — NL (ref 0.0–15.2)
WBC (HT): 8.5 10 3/uL — NL (ref 4.0–11.0)

## 2017-09-19 NOTE — Telephone Encounter (Signed)
[facility] and sent to scanning.

## 2017-09-20 LAB — UNMAPPED LAB RESULTS
Hematocrit (HT): 31 % — ABNORMAL LOW (ref 40–52)
Hemoglobin (HGB) (HT): 10.5 g/dL — ABNORMAL LOW (ref 13.0–18.0)
MCHC (HT): 33.8 g/dL — NL (ref 32.0–37.5)
MCV (HT): 95 fL — NL (ref 80–100)
Mean Corpuscular Hemoglobin (MCH) (HT): 31.9 pg — NL (ref 26.0–34.0)
Platelets (HT): 188 10 3/uL — NL (ref 150–450)
RBC (HT): 3.29 10 6/uL — ABNORMAL LOW (ref 4.40–6.20)
RDW (HT): 13.6 % — NL (ref 0.0–15.2)
WBC (HT): 6.6 10 3/uL — NL (ref 4.0–11.0)

## 2017-09-21 LAB — UNMAPPED LAB RESULTS
Hematocrit (HT): 36 % — ABNORMAL LOW (ref 40–52)
Hemoglobin (HGB) (HT): 12.3 g/dL — ABNORMAL LOW (ref 13.0–18.0)
MCHC (HT): 34.5 g/dL — NL (ref 32.0–37.5)
MCV (HT): 93 fL — NL (ref 80–100)
Mean Corpuscular Hemoglobin (MCH) (HT): 31.9 pg — NL (ref 26.0–34.0)
Platelets (HT): 186 10 3/uL — NL (ref 150–450)
RBC (HT): 3.86 10 6/uL — ABNORMAL LOW (ref 4.40–6.20)
RDW (HT): 13.2 % — NL (ref 0.0–15.2)
WBC (HT): 5.8 10 3/uL — NL (ref 4.0–11.0)

## 2017-09-22 LAB — UNMAPPED LAB RESULTS
Hematocrit (HT): 34 % — ABNORMAL LOW (ref 40–52)
Hemoglobin (HGB) (HT): 11.8 g/dL — ABNORMAL LOW (ref 13.0–18.0)
MCHC (HT): 35 g/dL — NL (ref 32.0–37.5)
MCV (HT): 90 fL — NL (ref 80–100)
Mean Corpuscular Hemoglobin (MCH) (HT): 31.6 pg — NL (ref 26.0–34.0)
Platelets (HT): 208 10 3/uL — NL (ref 150–450)
RBC (HT): 3.74 10 6/uL — ABNORMAL LOW (ref 4.40–6.20)
RDW (HT): 12.9 % — NL (ref 0.0–15.2)
WBC (HT): 5.7 10 3/uL — NL (ref 4.0–11.0)

## 2017-09-23 LAB — UNMAPPED LAB RESULTS
Hematocrit (HT): 37 % — ABNORMAL LOW (ref 40–52)
Hemoglobin (HGB) (HT): 12.9 g/dL — ABNORMAL LOW (ref 13.0–18.0)
MCHC (HT): 34.6 g/dL — NL (ref 32.0–37.5)
MCV (HT): 91 fL — NL (ref 80–100)
Mean Corpuscular Hemoglobin (MCH) (HT): 31.4 pg — NL (ref 26.0–34.0)
Platelets (HT): 228 10 3/uL — NL (ref 150–450)
RBC (HT): 4.11 10 6/uL — ABNORMAL LOW (ref 4.40–6.20)
RDW (HT): 13.1 % — NL (ref 0.0–15.2)
WBC (HT): 6.6 10 3/uL — NL (ref 4.0–11.0)

## 2017-09-24 LAB — UNMAPPED LAB RESULTS
Hematocrit (HT): 34 % — ABNORMAL LOW (ref 40–52)
Hemoglobin (HGB) (HT): 12.1 g/dL — ABNORMAL LOW (ref 13.0–18.0)
MCHC (HT): 35.2 g/dL — NL (ref 32.0–37.5)
MCV (HT): 90 fL — NL (ref 80–100)
Mean Corpuscular Hemoglobin (MCH) (HT): 31.5 pg — NL (ref 26.0–34.0)
Platelets (HT): 216 10 3/uL — NL (ref 150–450)
RBC (HT): 3.84 10 6/uL — ABNORMAL LOW (ref 4.40–6.20)
RDW (HT): 13.2 % — NL (ref 0.0–15.2)
WBC (HT): 6.4 10 3/uL — NL (ref 4.0–11.0)

## 2017-09-25 LAB — UNMAPPED LAB RESULTS
Hematocrit (HT): 36 % — ABNORMAL LOW (ref 40–52)
Hemoglobin (HGB) (HT): 12.3 g/dL — ABNORMAL LOW (ref 13.0–18.0)
MCHC (HT): 34.5 g/dL — NL (ref 32.0–37.5)
MCV (HT): 90 fL — NL (ref 80–100)
Mean Corpuscular Hemoglobin (MCH) (HT): 31.1 pg — NL (ref 26.0–34.0)
Platelets (HT): 206 10 3/uL — NL (ref 150–450)
RBC (HT): 3.95 10 6/uL — ABNORMAL LOW (ref 4.40–6.20)
RDW (HT): 13.1 % — NL (ref 0.0–15.2)
WBC (HT): 6.3 10 3/uL — NL (ref 4.0–11.0)

## 2017-09-26 DIAGNOSIS — J9611 Chronic respiratory failure with hypoxia: Secondary | ICD-10-CM | POA: Insufficient documentation

## 2017-09-26 LAB — UNMAPPED LAB RESULTS
Basophil # (HT): 0 10 3/uL — NL (ref 0.0–0.2)
Basophil % (HT): 0 % — NL (ref 0–3)
Eosinophil # (HT): 0.1 10 3/uL — NL (ref 0.0–0.6)
Eosinophil % (HT): 1 % — NL (ref 0–5)
Hematocrit (HT): 33 % — ABNORMAL LOW (ref 40–52)
Hematocrit (HT): 32 % — ABNORMAL LOW (ref 40–52)
Hemoglobin (HGB) (HT): 11.4 g/dL — ABNORMAL LOW (ref 13.0–18.0)
Hemoglobin (HGB) (HT): 11.4 g/dL — ABNORMAL LOW (ref 13.0–18.0)
Lymphocyte # (HT): 2.9 10 3/uL — NL (ref 1.0–4.8)
Lymphocyte % (HT): 31 % — NL (ref 15–45)
MCHC (HT): 35 g/dL — NL (ref 32.0–37.5)
MCHC (HT): 36.2 g/dL — NL (ref 32.0–37.5)
MCV (HT): 90 fL — NL (ref 80–100)
MCV (HT): 90 fL — NL (ref 80–100)
Mean Corpuscular Hemoglobin (MCH) (HT): 31.5 pg — NL (ref 26.0–34.0)
Mean Corpuscular Hemoglobin (MCH) (HT): 32.4 pg — NL (ref 26.0–34.0)
Monocyte # (HT): 0.4 10 3/uL — NL (ref 0.1–1.0)
Monocyte % (HT): 4 % — NL (ref 0–15)
Neutrophil # (HT): 5.9 10 3/uL — NL (ref 1.8–8.0)
Platelets (HT): 204 10 3/uL — NL (ref 150–450)
Platelets (HT): 171 10 3/uL — NL (ref 150–450)
RBC (HT): 3.62 10 6/uL — ABNORMAL LOW (ref 4.40–6.20)
RBC (HT): 3.52 10 6/uL — ABNORMAL LOW (ref 4.40–6.20)
RDW (HT): 13 % — NL (ref 0.0–15.2)
RDW (HT): 13.2 % — NL (ref 0.0–15.2)
Seg Neut % (HT): 61 % — NL (ref 45–75)
WBC (HT): 9.4 10 3/uL — NL (ref 4.0–11.0)
WBC (HT): 9.5 10 3/uL — NL (ref 4.0–11.0)

## 2017-09-27 LAB — UNMAPPED LAB RESULTS
Hematocrit (HT): 32 % — ABNORMAL LOW (ref 40–52)
Hemoglobin (HGB) (HT): 11.5 g/dL — ABNORMAL LOW (ref 13.0–18.0)
MCHC (HT): 35.9 g/dL — NL (ref 32.0–37.5)
MCV (HT): 89 fL — NL (ref 80–100)
Mean Corpuscular Hemoglobin (MCH) (HT): 32.1 pg — NL (ref 26.0–34.0)
Platelets (HT): 179 10 3/uL — NL (ref 150–450)
RBC (HT): 3.58 10 6/uL — ABNORMAL LOW (ref 4.40–6.20)
RDW (HT): 13.3 % — NL (ref 0.0–15.2)
WBC (HT): 10.2 10 3/uL — NL (ref 4.0–11.0)

## 2017-09-28 LAB — UNMAPPED LAB RESULTS
Hematocrit (HT): 34 % — ABNORMAL LOW (ref 40–52)
Hemoglobin (HGB) (HT): 11.8 g/dL — ABNORMAL LOW (ref 13.0–18.0)
MCHC (HT): 34.9 g/dL — NL (ref 32.0–37.5)
MCV (HT): 91 fL — NL (ref 80–100)
Mean Corpuscular Hemoglobin (MCH) (HT): 31.8 pg — NL (ref 26.0–34.0)
Platelets (HT): 166 10 3/uL — NL (ref 150–450)
RBC (HT): 3.71 10 6/uL — ABNORMAL LOW (ref 4.40–6.20)
RDW (HT): 13.7 % — NL (ref 0.0–15.2)
WBC (HT): 8.5 10 3/uL — NL (ref 4.0–11.0)

## 2017-09-29 LAB — UNMAPPED LAB RESULTS
Hematocrit (HT): 33 % — ABNORMAL LOW (ref 40–52)
Hemoglobin (HGB) (HT): 11.9 g/dL — ABNORMAL LOW (ref 13.0–18.0)
MCHC (HT): 35.6 g/dL — NL (ref 32.0–37.5)
MCV (HT): 90 fL — NL (ref 80–100)
Mean Corpuscular Hemoglobin (MCH) (HT): 32.2 pg — NL (ref 26.0–34.0)
Platelets (HT): 205 10 3/uL — NL (ref 150–450)
RBC (HT): 3.7 10 6/uL — ABNORMAL LOW (ref 4.40–6.20)
RDW (HT): 14 % — NL (ref 0.0–15.2)
WBC (HT): 11.5 10 3/uL — ABNORMAL HIGH (ref 4.0–11.0)

## 2017-09-30 LAB — UNMAPPED LAB RESULTS
Basophil # (HT): 0 10 3/uL — NL (ref 0.0–0.2)
Basophil % (HT): 0 % — NL (ref 0–3)
Eosinophil # (HT): 0.1 10 3/uL — NL (ref 0.0–0.6)
Eosinophil % (HT): 1 % — NL (ref 0–5)
Hematocrit (HT): 33 % — ABNORMAL LOW (ref 40–52)
Hematocrit (HT): 33 % — ABNORMAL LOW (ref 40–52)
Hemoglobin (HGB) (HT): 11.7 g/dL — ABNORMAL LOW (ref 13.0–18.0)
Hemoglobin (HGB) (HT): 11.7 g/dL — ABNORMAL LOW (ref 13.0–18.0)
Lymphocyte # (HT): 2.3 10 3/uL — NL (ref 1.0–4.8)
Lymphocyte % (HT): 18 % — NL (ref 15–45)
MCHC (HT): 35.2 g/dL — NL (ref 32.0–37.5)
MCHC (HT): 35.2 g/dL — NL (ref 32.0–37.5)
MCV (HT): 91 fL — NL (ref 80–100)
MCV (HT): 91 fL — NL (ref 80–100)
Mean Corpuscular Hemoglobin (MCH) (HT): 31.9 pg — NL (ref 26.0–34.0)
Mean Corpuscular Hemoglobin (MCH) (HT): 31.9 pg — NL (ref 26.0–34.0)
Monocyte # (HT): 0.6 10 3/uL — NL (ref 0.1–1.0)
Monocyte % (HT): 5 % — NL (ref 0–15)
Neutrophil # (HT): 9.7 10 3/uL — ABNORMAL HIGH (ref 1.8–8.0)
Platelets (HT): 178 10 3/uL — NL (ref 150–450)
Platelets (HT): 178 10 3/uL — NL (ref 150–450)
RBC (HT): 3.67 10 6/uL — ABNORMAL LOW (ref 4.40–6.20)
RBC (HT): 3.67 10 6/uL — ABNORMAL LOW (ref 4.40–6.20)
RDW (HT): 14.4 % — NL (ref 0.0–15.2)
RDW (HT): 14.4 % — NL (ref 0.0–15.2)
Seg Neut % (HT): 76 % — ABNORMAL HIGH (ref 45–75)
WBC (HT): 12.8 10 3/uL — ABNORMAL HIGH (ref 4.0–11.0)
WBC (HT): 12.8 10 3/uL — ABNORMAL HIGH (ref 4.0–11.0)

## 2017-10-01 LAB — UNMAPPED LAB RESULTS
Hematocrit (HT): 34 % — ABNORMAL LOW (ref 40–52)
Hemoglobin (HGB) (HT): 11.8 g/dL — ABNORMAL LOW (ref 13.0–18.0)
MCHC (HT): 35.2 g/dL — NL (ref 32.0–37.5)
MCV (HT): 92 fL — NL (ref 80–100)
Mean Corpuscular Hemoglobin (MCH) (HT): 32.5 pg — NL (ref 26.0–34.0)
Platelets (HT): 191 10 3/uL — NL (ref 150–450)
RBC (HT): 3.63 10 6/uL — ABNORMAL LOW (ref 4.40–6.20)
RDW (HT): 15 % — NL (ref 0.0–15.2)
WBC (HT): 17.2 10 3/uL — ABNORMAL HIGH (ref 4.0–11.0)

## 2017-10-02 LAB — UNMAPPED LAB RESULTS
Hematocrit (HT): 35 % — ABNORMAL LOW (ref 40–52)
Hemoglobin (HGB) (HT): 12.4 g/dL — ABNORMAL LOW (ref 13.0–18.0)
MCHC (HT): 35.5 g/dL — NL (ref 32.0–37.5)
MCV (HT): 91 fL — NL (ref 80–100)
Mean Corpuscular Hemoglobin (MCH) (HT): 32.2 pg — NL (ref 26.0–34.0)
Platelets (HT): 201 10 3/uL — NL (ref 150–450)
RBC (HT): 3.85 10 6/uL — ABNORMAL LOW (ref 4.40–6.20)
RDW (HT): 14.7 % — NL (ref 0.0–15.2)
WBC (HT): 9.2 10 3/uL — NL (ref 4.0–11.0)

## 2017-10-09 ENCOUNTER — Telehealth: Payer: Self-pay

## 2017-10-09 NOTE — Telephone Encounter (Signed)
[name] contacted and inquiring on status of referral. Writer did inform that the Father did not wish to make appointment however she stated that was when patient was in the hospital and now the patient has been discharged and needs an appointment as soon as possible. Needs vent orders.    Please contact [name] at [phone] to discuss.

## 2017-10-12 NOTE — Telephone Encounter (Signed)
New patient paperwork mailed to patient.

## 2017-10-14 ENCOUNTER — Encounter: Payer: Self-pay | Admitting: Gastroenterology

## 2017-10-15 ENCOUNTER — Ambulatory Visit: Payer: MEDICAID

## 2017-11-18 NOTE — Progress Notes (Deleted)
***   returns to Pulmonary Vent Clinic for ***    Since last visit ***  Early April, foul secretions    ROS:  Denies headache, increasing daytime somnolence, visual disturbances.  Denies hallucinations or confusion.  Trach tube changes are routinely done by***  #***   Trach tube  Suctioning of secretions *** times per day on average, there has been no recent increase in secretions and they are usually *** in color.  No ventilator settings changes since last visit.  FiO2*** ***mode rate*** vT*** PEEP***  Ventilator vendor is ***  No recent weight changes or development/increase if lower extremity edema     PHYSICAL EXAM & DATA   There were no vitals taken for this visit. on ***    Gen: NAD, speaking in full sentences.  HEENT: MMM, patent nares with non-edematous turbinates and no rhinorrhea, no oropharyngeal lesions, a non-deviated trachea, *** tracheostomy tube in place,  no stridor, and no cervical or supraclavicular lymphadenopathy.    Resp: No increased WOB, CTAB without crackles or wheezes.    Card: S1S2 without murmurs, normal jugular venous pressure, and ***pedal edema.  Abd: Soft, NT, ND, normoactive BS  MSK: No cyanosis, clubbing, or joint tenderness.    Neuro: No focal motor or sensory defects.    Integument: No significant lesions or rashes.     Assessment/Plan:              ***Will plan to return in *** months.    Kenna Gilbert, NP   10:07 PM  11/18/2017

## 2017-11-19 ENCOUNTER — Ambulatory Visit: Payer: MEDICAID

## 2017-11-21 ENCOUNTER — Telehealth: Payer: Self-pay

## 2017-11-21 NOTE — Telephone Encounter (Signed)
Fax request received requesting pulmonary notes to be faxed to [facility].  Patient was scheduled in vent clinic twice and appts were cancelled.  Voice message was left for Viviann Spare, patients father, to advise if he would like to reschedule a future Vent appt for Britney. (NPV/VENT APPT in vent clinic.)  [facility] advised.

## 2017-11-28 NOTE — Telephone Encounter (Signed)
Advised Angel Wells that the first opening was 6/10.  Angel Wells is scheduled on that day and Angel Wells will notify his family and arrange transportation.      Angel Wells noted that there are many issues around care for this patient.  He should be in a facility per PCP.  After a long discussion, family moved to [place] from [place] bringing equipment from a vendor without permission.  Angel Wells is working with [facility] to get equipment locally so they can return the other equipment.  PCP has not received records noting settings for vent so [facility] will not distribute without appropriate referral request.  Advised Angel Wells to call the previous DME company and request settings. She will try this.

## 2017-11-28 NOTE — Telephone Encounter (Signed)
[name], care manager from [facility] office is calling to check on the status of this patients appointment at the vent clinic being moved up. She stated this is a urgent matter. Glee Arvin is requesting a call back.    Glee Arvin can be reached at [phone].

## 2017-11-28 NOTE — Telephone Encounter (Signed)
Angel Wells has a scheduled appointment on 8/12 with the vent clinic. [name] would like a sooner appointment if possible to be seen due to the patient's vent heater working improperly and [facility] is requesting he be seen before their able to assist.   She stated this is an urgent matter.  Writer called hotline and was advised to send a message.  Please call [name] back at [phone] to discuss.

## 2017-12-11 ENCOUNTER — Telehealth: Payer: Self-pay

## 2017-12-11 NOTE — Telephone Encounter (Signed)
Called patient and spoke with his father (EC) [name]. Explained that the Vent clinic for next Monday has been cancelled. The provider for the clinic will be on call. The clinic has been moved to Thursday June 20th. The patient's appointment time will be the same 2:15pm. He wrote down the information and the patient will be there.

## 2017-12-17 ENCOUNTER — Ambulatory Visit: Payer: MEDICAID

## 2017-12-26 ENCOUNTER — Telehealth: Payer: Self-pay

## 2017-12-26 NOTE — Telephone Encounter (Signed)
Glee ArvinLatoya is calling from Dr. Pamala HurryHolley's office regarding patient, Genia HotterJoshua Baney; there is a concern as to why the Vent appointments has been rescheduled and would like to discuss with staff.    Glee ArvinLatoya can be reached at phone #[phone].

## 2017-12-26 NOTE — Telephone Encounter (Addendum)
Called patient and spoke with his father, [name] to advise that Ching's appointment in VENT clinic tomorrow 12/27/2017 has been cancelled due to provider family emergency . Patient is rescheduled to 01/14/2018 at 3PM. His Dad confirms. Mailing letter to his home address.

## 2017-12-27 ENCOUNTER — Ambulatory Visit: Payer: MEDICAID

## 2017-12-27 ENCOUNTER — Telehealth: Payer: Self-pay | Admitting: Pulmonary Disease

## 2017-12-27 NOTE — Telephone Encounter (Signed)
Called and left message on [name]'s confidential Vm to advise that [name]'s VENT appointment has been reschedule to 01/14/18 due to Dr. Sharlyne PacasManoff having to leave the state emergently for a family emergency.... Informed her that this information was told to [name]'s Father [name] when the appointment was rescheduled.

## 2017-12-27 NOTE — Telephone Encounter (Signed)
Angel Wells, Angel Wells would like a call back about the message that was left this morning. Would not go into further detail.     Please contact at [phone]

## 2017-12-27 NOTE — Telephone Encounter (Signed)
Called [name] back and left message requesting a call back.

## 2017-12-28 NOTE — Telephone Encounter (Signed)
Message to Nurse manager and procedure scheduling for AC3 to please advise scheduling for a possible 6/27 at 4PM NPV

## 2017-12-28 NOTE — Telephone Encounter (Signed)
Called [name] back and left vm to advise the day Dr. Sharlyne PacasManoff was able to get him in is not available staffing wise per our Nurse Manager, Elon JesterMichele. Informed her that the next available date is 01/14/2018 in which he is scheduled.

## 2017-12-28 NOTE — Telephone Encounter (Signed)
Called [name] back she is requesting that we may be able to get Ivin BootyJoshua in sooner as he has been waiting and he need to get orders set up with [facility] for a VENT..    Informed [name] that I will send a message to Dr. Sharlyne PacasManoff to please advise scheduling.

## 2018-01-14 ENCOUNTER — Telehealth: Payer: Self-pay

## 2018-01-14 ENCOUNTER — Ambulatory Visit: Payer: MEDICAID

## 2018-01-14 NOTE — Telephone Encounter (Signed)
This pt has a trach. Was scheduled to see Dr. Sharlyne PacasManoff. Not sure if any general pulm provider can see trach pt, or only specific providers. Forwarding to CM to advise.   Original request was for pulmonary consult, no mention of trach change.

## 2018-01-14 NOTE — Telephone Encounter (Signed)
Liborio NixonJanice, patient's step mother is calling to cancel Venice's appointment for today 7.8.19. She stated she was not aware of this appointment, her husband had emergency surgery and has been in rehab since last month and had no way to make her aware of this.  Liborio NixonJanice stated her step son's appointments were previously being arranged with her husband and Lotoya  from his PCP's office to set up his transportation. She also stated Ivin BootyJoshua will be getting his own care management nurse through [employer] who will be setting up his transportation for future appointments. Liborio NixonJanice stated that she would like to have them reschedule this appointment but can not wait until august(next available) for him to be seen. Liborio NixonJanice stated she is unsure who the contact person is at [employer] but the phone number is [phone].    Liborio NixonJanice can be reached at [phone].

## 2018-01-15 ENCOUNTER — Telehealth: Payer: Self-pay

## 2018-01-15 NOTE — Telephone Encounter (Signed)
This bronch scheduling was an error, should have been a vent clinic appointment only.  Dr. Jari SportsmanManoff's OAS will schedule.

## 2018-01-15 NOTE — Telephone Encounter (Signed)
Spoke with patients father [name](Stephen) to schedule bronch on 7/10.  He knew nothing about it, advised patient has not yet even been seen in clinic yet.  He declined 7/10 appointment, they will need more notice for transportation for any future appointments.  He was at rehab but will be home this afternoon.  He is asking for a call back from MD this afternoon when he will be home and able to discuss with his wife as well.  [name] or Liborio NixonJanice (parents):  [phone]

## 2018-01-15 NOTE — Telephone Encounter (Signed)
Per Dr. Manoff:  I just Sharlyne Pacasspoke with Joshs father regarding rescheduling.  Theyre tentatively able to come for 10:00 tomorrow for an NPV in the recovery room (theyre going to need to get transport; the father said that normally the doctors offices arrange transport for them).  I spoke to AdaMichele and the spot is currently on hold.  Theyre going to need new prescriptions for pretty much everything so I doubt well be doing a trach change.    Per Tiauna: Scheduled and confirmed. His father stated his PCP office is calling to get transportation worked out for tomorrow as patient now goes through [employer].

## 2018-01-16 ENCOUNTER — Ambulatory Visit: Payer: MEDICAID | Admitting: Pulmonary Disease

## 2018-01-16 NOTE — Telephone Encounter (Signed)
Ben an [facility] Care manager of the patient's care manager is calling back for patient, Mr. Angel Wells regarding rescheduling an appointment for patient (see previous detailed call).  Writer spoke w/Brittany on the back line which stated [facility]  Writer confirmed an ok to reschedule for the next available.  Writer Rescheduled the patient from the previously cancelled (first available) appointment for 02/18/18.  Romeo AppleBen stated he will communicate with the patient and he's parents as well as make sure [facility] is set up [facility]  Romeo AppleBen also stated the patient will be transported to [facility] by a Doctor, general practicetretcher.     Is the caller requesting a call back from the team nurse? No, but if any questions Romeo AppleBen can be reached at [phone]

## 2018-01-16 NOTE — Telephone Encounter (Signed)
Per Dr. Sharlyne PacasManoff: Patient can be scheduled in next Vent clinic..Marland Kitchen

## 2018-01-16 NOTE — Telephone Encounter (Signed)
Patients care manager is calling to cancel his appointment for today at 9:30. Writer was unable to obtain any detail as call was disconnected.

## 2018-02-18 ENCOUNTER — Ambulatory Visit: Payer: MEDICAID

## 2018-02-18 ENCOUNTER — Ambulatory Visit: Payer: Medicare Other | Attending: Pulmonology | Admitting: Pulmonology

## 2018-02-18 VITALS — BP 120/74 | Ht 65.0 in | Wt 185.0 lb

## 2018-02-18 DIAGNOSIS — J45909 Unspecified asthma, uncomplicated: Secondary | ICD-10-CM | POA: Insufficient documentation

## 2018-02-18 DIAGNOSIS — G40909 Epilepsy, unspecified, not intractable, without status epilepticus: Secondary | ICD-10-CM | POA: Insufficient documentation

## 2018-02-18 DIAGNOSIS — E119 Type 2 diabetes mellitus without complications: Secondary | ICD-10-CM

## 2018-02-18 DIAGNOSIS — S069X9A Unspecified intracranial injury with loss of consciousness of unspecified duration, initial encounter: Secondary | ICD-10-CM | POA: Insufficient documentation

## 2018-02-18 DIAGNOSIS — J9610 Chronic respiratory failure, unspecified whether with hypoxia or hypercapnia: Secondary | ICD-10-CM | POA: Insufficient documentation

## 2018-02-18 DIAGNOSIS — Z9911 Dependence on respirator [ventilator] status: Secondary | ICD-10-CM

## 2018-02-18 DIAGNOSIS — S069XAA Unspecified intracranial injury with loss of consciousness status unknown, initial encounter: Secondary | ICD-10-CM

## 2018-02-18 LAB — VENOUS BLOOD GAS
Base Excess,VENOUS: -1 mmol/L (ref <=2)
Bicarbonate,VENOUS: 23 mmol/L (ref 21–28)
CO2 (Calc),VENOUS: 24 mmol/L (ref 22–31)
CO: 0.8 %
FO2 HB,VENOUS: 89 % — ABNORMAL HIGH (ref 63–83)
Hemoglobin: 13.4 g/dL — ABNORMAL LOW (ref 13.7–17.5)
Methemoglobin: 0.2 % (ref 0.0–1.0)
PCO2,VENOUS: 38 mm Hg — ABNORMAL LOW (ref 40–50)
PH,VENOUS: 7.41 (ref 7.32–7.42)
PO2,VENOUS: 62 mm Hg — ABNORMAL HIGH (ref 25–43)

## 2018-02-18 LAB — POCT GLUCOSE: Glucose POCT: 139 mg/dL — ABNORMAL HIGH (ref 60–99)

## 2018-02-18 MED ORDER — GUAIFENESIN 100 MG/5ML PO SYRUP *WRAPPED*
200.0000 mg | ORAL_SOLUTION | Freq: Three times a day (TID) | 1 refills | Status: DC | PRN
Start: 2018-02-18 — End: 2019-01-29
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
## 2018-02-18 NOTE — Patient Instructions (Signed)
Continue current practice of HME on trach tube around the clock  Add humidified trach collar if you start to get thicker secretions, at least 4 hours a day    Please call the Pulmonary Clinic [phone] for:  - increased secretions that persist for more than 24hrs and/or associated with fever or feeling ill  - thick secretions that you are unable to suction  - need for increased O2 to keep O2 sat > 90% that persist for > 24hrs  - increased daytime sleepiness, increasing frequency of headache, increasing weight/lower extremity edema    Start Guaifenesin for cough    Continue Budesonide twice a day  Continue Duonebs four times a day  Continue pulmonary tolieting as you are.

## 2018-02-20 ENCOUNTER — Encounter: Payer: Self-pay | Admitting: Pulmonology

## 2018-02-20 DIAGNOSIS — J45909 Unspecified asthma, uncomplicated: Secondary | ICD-10-CM | POA: Insufficient documentation

## 2018-02-20 DIAGNOSIS — E1165 Type 2 diabetes mellitus with hyperglycemia: Secondary | ICD-10-CM | POA: Insufficient documentation

## 2018-02-20 DIAGNOSIS — S069XAA Unspecified intracranial injury with loss of consciousness status unknown, initial encounter: Secondary | ICD-10-CM | POA: Insufficient documentation

## 2018-02-20 DIAGNOSIS — Z794 Long term (current) use of insulin: Secondary | ICD-10-CM | POA: Insufficient documentation

## 2018-02-20 DIAGNOSIS — S069X9A Unspecified intracranial injury with loss of consciousness of unspecified duration, initial encounter: Secondary | ICD-10-CM | POA: Insufficient documentation

## 2018-02-20 DIAGNOSIS — E119 Type 2 diabetes mellitus without complications: Secondary | ICD-10-CM | POA: Insufficient documentation

## 2018-02-20 DIAGNOSIS — G40219 Localization-related (focal) (partial) symptomatic epilepsy and epileptic syndromes with complex partial seizures, intractable, without status epilepticus: Secondary | ICD-10-CM | POA: Insufficient documentation

## 2018-02-22 ENCOUNTER — Telehealth: Payer: Self-pay

## 2018-02-22 MED ORDER — GENERIC DME *A*
4 refills | Status: DC
Start: 2018-02-22 — End: 2018-07-23

## 2018-02-22 NOTE — Telephone Encounter (Signed)
Faxed to [facility]'s attention at [facility] for Tracheostomy 7.0 Bivona TTS - change every 3 months, Patient Discharge Assessment form and Medicare qualification for exercise testing form to fax numbers [phone] and [phone].

## 2018-02-26 ENCOUNTER — Other Ambulatory Visit: Payer: Self-pay | Admitting: Pulmonology

## 2018-02-26 ENCOUNTER — Telehealth: Payer: Self-pay | Admitting: Pulmonology

## 2018-02-26 DIAGNOSIS — J9610 Chronic respiratory failure, unspecified whether with hypoxia or hypercapnia: Secondary | ICD-10-CM

## 2018-02-26 DIAGNOSIS — J9690 Respiratory failure, unspecified, unspecified whether with hypoxia or hypercapnia: Secondary | ICD-10-CM

## 2018-02-26 MED ORDER — OXYGEN *A*
0 refills | Status: DC
Start: 2018-02-26 — End: 2018-02-26

## 2018-02-26 MED ORDER — GENERIC DME *A*
0 refills | Status: DC
Start: 2018-02-26 — End: 2018-07-23

## 2018-02-26 MED ORDER — OXYGEN *A*
0 refills | Status: DC
Start: 2018-02-26 — End: 2018-07-23

## 2018-02-26 NOTE — Telephone Encounter (Signed)
Jari SportsmanShelly Vonglis with [facility] and Maggie SchwalbeJanice Wynter, patient's mother, called regarding a few things. Liborio NixonJanice wanted to give the office permission to discuss the patient's case with any nurse from [facility]. Burnett HarryShelly stated she has multiple questions regarding patient's medications and lab results. Shelly stated they need orders for patient's medications. She also wants to know if there is any other information that is needed from her for the Ventilator order for [facility]. Burnett HarryShelly is requesting a call back at [phone].     Maggie SchwalbeJanice Reppond can be reached at [phone] if necessary.  Lawanda CousinsStephen Eisenhardt, patient's father, can be reached at [phone] if necessary.

## 2018-02-26 NOTE — Telephone Encounter (Signed)
Dondra SpryGail with [facility] called requesting for Angel Wells to fax [facility] an order for a Ventilator, as soon as possible. She stated patient's length is 70 inches. She stated the fax number for [facility] is on the paper that patient brought to appointment last week. Dondra SpryGail can be reached at [phone] if necessary.

## 2018-02-26 NOTE — Telephone Encounter (Signed)
Called Angel Wells back to clarify what is needed.  Asked her to call back. Notes and all orders that they needed for ventilator and trach's were sent in on Friday.  Kenna Gilbertina M Alianna Wurster, NP

## 2018-02-26 NOTE — Telephone Encounter (Signed)
Spoke with Burnett HarryShelly at [facility].  She is requesting   Rx for:  -2 L/m via vent at night.    -Portable suction machine  -ventilator supplies   All Rx given to Wynelle ClevelandKristina Bond Ashmead to [facility] to [facility]  Kenna Gilbertina M Fatiha Guzy, NP

## 2018-02-28 ENCOUNTER — Telehealth: Payer: Self-pay | Admitting: Pulmonology

## 2018-02-28 ENCOUNTER — Other Ambulatory Visit: Payer: Self-pay | Admitting: Pulmonology

## 2018-02-28 DIAGNOSIS — J9690 Respiratory failure, unspecified, unspecified whether with hypoxia or hypercapnia: Secondary | ICD-10-CM

## 2018-02-28 NOTE — Telephone Encounter (Signed)
Called [facility] at [facility] to get clarification of what else is needed for the patient. Informed her of the message the office received from [name] at the patient's PCP's office. She said that the information in the message was old and not to worry about it. The only thing she is waiting for from the office is a new request for the patient to do an overnight oximetry.     Stated that the order has been written and will be faxed to her attention shortly.

## 2018-02-28 NOTE — Telephone Encounter (Signed)
Faxed to Kym Johnson's attention progress note from 02/18/18 office visit, Script for ventilator supplies, script for portable suction machine, and a script for oxygen at 2L/Min bleed through ventilator at night to fax number [phone].

## 2018-02-28 NOTE — Telephone Encounter (Signed)
Called [facility] and spoke with Coy SaunasKym Johnson, office manager who is handling the patient's care. Asked for her to confirm that the fax that was sent over on Friday had been received. She stated that she had received it. Explained to her what the patient's nurse from [facility] was requesting be sent over. Asked if there was anything else she will need from [facility].     Confirmed that a copy of the most recent progress note was needed; any and all scripts for supplies for the Ventilator and portable suction machine. She said that she the patient does not qualify for oxygen.  He did not meet the necessary qualifications when he was tested in the Spring.    She will contact [facility] back directly if she needs anything else for the patient.

## 2018-02-28 NOTE — Telephone Encounter (Signed)
[name], the care manager from Dr. Pamala HurryHolley's office (PCP) is calling requesting that the forms that were [facility] were never completed and refaxed over. Glee ArvinLatoya is requesting that the forms that were faxed over sre filled out and faxed back along with the office visit notes from 8/20. [name] stated from the patients testing that the office received the patient does not qualify so is requesting that the patient gets retested or a overnight oximetry test is completed. Glee ArvinLatoya is requesting that all this information is faxed to [facility] at [phone] [facility] K[facility] Johnson.    Glee ArvinLatoya can be reached with any questions or concerns at [phone]

## 2018-03-06 ENCOUNTER — Other Ambulatory Visit: Payer: Self-pay | Admitting: Pulmonology

## 2018-03-06 ENCOUNTER — Encounter: Payer: Self-pay | Admitting: Gastroenterology

## 2018-03-06 DIAGNOSIS — J45909 Unspecified asthma, uncomplicated: Secondary | ICD-10-CM

## 2018-03-06 MED ORDER — NEBULIZER/TUBING/MOUTHPIECE KIT *A*
PACK | 0 refills | Status: DC
Start: 2018-03-06 — End: 2018-03-06

## 2018-03-06 MED ORDER — NEBULIZER/TUBING/MOUTHPIECE KIT *A*
PACK | 0 refills | Status: DC
Start: 2018-03-06 — End: 2018-07-23

## 2018-03-06 NOTE — Telephone Encounter (Signed)
Pulse oximetry summary report of 03/06/18 received and given to Columbiaina. A copy will be sent to scanning.

## 2018-03-07 ENCOUNTER — Encounter: Payer: Self-pay | Admitting: Gastroenterology

## 2018-03-18 ENCOUNTER — Encounter: Payer: Self-pay | Admitting: Gastroenterology

## 2018-03-20 ENCOUNTER — Encounter: Payer: Self-pay | Admitting: Gastroenterology

## 2018-06-04 ENCOUNTER — Telehealth: Payer: Self-pay | Admitting: Pulmonology

## 2018-06-04 NOTE — Telephone Encounter (Signed)
[facility], left message for her to call back.  RT spoke with [employer] rep.  They require 6 months worth of clinic notes (we do not have that) recent xray, and proof of frequent pneumonias that required treatments. He may not qualify for coverage of vest.

## 2018-06-04 NOTE — Telephone Encounter (Signed)
Copied from CRM [id number]. Topic: Medications/Prescriptions - Medication Question/Problem  >> Jun 04, 2018 12:00 PM Rayden Scheper, [place] wrote:  Benetta SparGail McWilliams from [facility] called to see if it is possible to have the vest [facility]  reference #[id number] cm to prescribe. She states that DME equipment can be prescribed from [facility]. Please fax prescription at [phone].    Dondra SpryGail can be reached at [phone] to discuss.

## 2018-07-19 ENCOUNTER — Other Ambulatory Visit: Payer: Self-pay | Admitting: Pulmonology

## 2018-07-19 NOTE — Telephone Encounter (Signed)
Copied from CRM [id number]. Topic: Medications/Prescriptions - Prescription Status/Information  >> Jul 19, 2018  3:55 PM Louellen Molder wrote:  Benetta Spar from [facility] is calling requesting that a prescription is written for the patient for vent and supplies, trach, nebulizer, oxygen condencer.vest and cough assist and all additional supplies. [name] is requesting that all of this is now sent to [facility]    [name] is requesting call when this has been completed as the patients father is getting eager. [name] can be reached at [phone]

## 2018-07-23 ENCOUNTER — Other Ambulatory Visit: Payer: Self-pay | Admitting: Pulmonology

## 2018-07-23 DIAGNOSIS — Z9911 Dependence on respirator [ventilator] status: Secondary | ICD-10-CM

## 2018-07-23 DIAGNOSIS — J9690 Respiratory failure, unspecified, unspecified whether with hypoxia or hypercapnia: Secondary | ICD-10-CM

## 2018-07-23 DIAGNOSIS — J9610 Chronic respiratory failure, unspecified whether with hypoxia or hypercapnia: Secondary | ICD-10-CM

## 2018-07-23 DIAGNOSIS — J45909 Unspecified asthma, uncomplicated: Secondary | ICD-10-CM

## 2018-07-23 MED ORDER — GENERIC DME *A*
0 refills | Status: DC
Start: 2018-07-23 — End: 2018-12-24

## 2018-07-23 MED ORDER — NEBULIZER/TUBING/MOUTHPIECE KIT *A*
PACK | 0 refills | Status: DC
Start: 2018-07-23 — End: 2018-12-24

## 2018-07-23 MED ORDER — GENERIC DME *A*
0 refills | Status: DC
Start: 2018-07-23 — End: 2019-06-12

## 2018-07-23 MED ORDER — OXYGEN *A*
0 refills | Status: DC
Start: 2018-07-23 — End: 2022-06-23

## 2018-07-23 MED ORDER — GENERIC DME *A*
4 refills | Status: DC
Start: 2018-07-23 — End: 2019-02-07

## 2018-07-23 NOTE — Progress Notes (Signed)
Rx for respiratory supplies reprinted and given to Elnita Maxwell to fax to vendor. Kenna Gilbert, NP

## 2018-07-23 NOTE — Telephone Encounter (Signed)
Vent supplies faxed to [facility].  See med list for list of supplies faxed. [name] has been notified.  She noted that [facility] needs to review what is needed and will then advise if they can take this patient on.

## 2018-08-27 ENCOUNTER — Telehealth: Payer: Self-pay

## 2018-08-27 NOTE — Telephone Encounter (Signed)
Copied from CRM [id number]. Topic: Appointments - Schedule Appointment  >> Aug 27, 2018  3:48 PM Benay Spice wrote:  Angel Wells with [facility] called to scheduled patient for appt with vent clinic. She stated patient is having seizures, harder time to get off vent in the morning and has gained weight. They want to make sure the settings for the vent are correct. Writer scheduled patient for 09/02/18 at 2:15pm. She stated [facility] will be bringing the pt to the appt and will be transporting him by stretcher. [facility] can be reached at [phone] if necessary.

## 2018-09-02 ENCOUNTER — Ambulatory Visit: Payer: Medicare Other | Attending: Pulmonology | Admitting: Pulmonology

## 2018-09-02 ENCOUNTER — Telehealth: Payer: Self-pay

## 2018-09-02 VITALS — BP 121/97 | HR 79

## 2018-09-02 DIAGNOSIS — K219 Gastro-esophageal reflux disease without esophagitis: Secondary | ICD-10-CM

## 2018-09-02 DIAGNOSIS — R0902 Hypoxemia: Secondary | ICD-10-CM

## 2018-09-02 DIAGNOSIS — Z9911 Dependence on respirator [ventilator] status: Secondary | ICD-10-CM

## 2018-09-02 LAB — VENOUS BLOOD GAS
Base Excess,VENOUS: -3 mmol/L (ref <=2)
Bicarbonate,VENOUS: 22 mmol/L (ref 21–28)
CO2 (Calc),VENOUS: 23 mmol/L (ref 22–31)
CO: 0.8 %
FO2 HB,VENOUS: 85 % — ABNORMAL HIGH (ref 63–83)
Hemoglobin: 13.9 g/dL (ref 13.7–17.5)
Methemoglobin: 0.4 % (ref 0.0–1.0)
PCO2,VENOUS: 39 mm Hg — ABNORMAL LOW (ref 40–50)
PH,VENOUS: 7.37 (ref 7.32–7.42)
PO2,VENOUS: 55 mm Hg — ABNORMAL HIGH (ref 25–43)

## 2018-09-02 NOTE — Progress Notes (Signed)
Angel Wells returns to [facility] for urgent visit.   Angel Wells returns today as an urgent visit. Nursing staff called because he has been having increased seizure activity, it has been harder for him to come off the vent in the morning and they are concerned about weight gain.       -He has been treated twice for pneumonia since last visit.   -Nursing staff uses albuterol/duonebs piror to vest therapy and nebulized budesonide BID  -He occasionally will have thick secretions, nurse is not sure if he is getting appropriate amount of H2O by other nursing staff.   -Nurse is concerned with vent settings, she does not feel like they are correct.  -At times they do have problem's weaning pt off vent in the morning. She notes that he does desaturate into 60's. she will put him back on ventilator, give nebs, suction, vest therapy, and then he is able to come off vent.  She also notices his lung sounds are more "junky" when he is off vent  Nursing staff does not feel his anti-siezure medication is adequate- they are requesting levels to be drawn.  They have no means to weigh patient at home    ROS:  Denies headache, increasing daytime somnolence, visual disturbances.  Denies hallucinations or confusion.  Trach tube changes are routinely done by home nurse- done every two weeks  # 7.0, Bivona T.T.S trach tube  No ventilator settings changes since last visit.  FiO2 2 L, SIMV/CPAP PC-21/PS-13, rate 10 vT 310 PEEP 5  1-2 LPM bleed   Ventilator vendor is [employer].   No recent weight changes or development/increase if lower extremity edema     PHYSICAL EXAM & DATA   Blood pressure (!) 121/97, pulse 79, SpO2 97 %. on RA    Gen: NAD, laying in bed, asking to go home  HEENT: MMM, no oropharyngeal lesions, a non-deviated trachea, tracheostomy tube in place with Passy no stridor, and no cervical or supraclavicular lymphadenopathy.    Resp: No increased WOB, CTAB without crackles or wheezes.   Card: S1S2 without murmurs, normal  jugular venous pressure, and NO pedal edema.    Abd: Soft, NT, ND, normoactive BS  MSK: No cyanosis, clubbing, or joint tenderness.    Neuro: deferred      VBG  Results for Angel Wells, Angel Wells (MRN [id number]) as of 09/10/2018 16:20   Ref. Range 09/02/2018 15:14   PH,VENOUS Latest Ref Range: 7.32 - 7.42  7.37   PCO2,VENOUS Latest Ref Range: 40 - 50 mm Hg 39 (L)   PO2,VENOUS Latest Ref Range: 25 - 43 mm Hg 55 (H)   Bicarbonate,VENOUS Latest Ref Range: 21 - 28 mmol/L 22   FO2 HB,VENOUS Latest Ref Range: 63 - 83 % 85 (H)   Base Excess,VENOUS Latest Ref Range: -3 - 2 mmol/L -3   CO2 (Calc),VENOUS Latest Ref Range: 22 - 31 mmol/L 23   Methemoglobin Latest Ref Range: 0.0 - 1.0 % 0.4   CO Latest Units: % 0.8       Assessment/Plan:  Angel Wells is a 28 y.o. male with a past medical history of chronic respiratory failure and hypoxia, nocturnal vent dependence, asthma, diabetes, GERD, seizures, and a TBI. There is no clear indication at this time that Vent settings need to be adjusted.  Will obtain an overnight oximetry on vent and 1 LPM bleed to make sure he does not have desaturations at night. The aventasious lung sounds nurse is hearing is likely upper respiratory  sounds caused by passy muir valve. I let nurse know that I do not feel comfortable managing anti-seizure medications, he does not have neurologist in area. I have made a referral to our complex care center due to his complex medical history.        Kenna Gilbert, NP     2:43 PM  09/02/2018

## 2018-09-02 NOTE — Patient Instructions (Signed)
Continue current vent settings for now    Continue current nebulizer regimen and vest therapy    Continue trach changes as you are    VBG was normal     He will need an overnight pulse ox this needs to be performed on current vent settings and 1 LPM.      Continue current practice of HME on trach tube around the clock    Add humidified trach collar if you start to get thicker secretions, at least 4 hours a day    Please call the Pulmonary Clinic @[phone] for:    - increased secretions that persist for more than 24hrs and/or associated with fever or feeling ill  - thick secretions that you are unable to suction  - need for increased O2 to keep O2 sat > 90% that persist for > 24hrs  - increased daytime sleepiness, increasing frequency of headache, increasing weight/lower extremity edema

## 2018-09-02 NOTE — Telephone Encounter (Signed)
Request for Overnight Oximetry faxed to [facility] at [phone].

## 2018-09-02 NOTE — H&P (Signed)
Patient here for Vent clinic appointment at 2:15 on AC3. Appointment completed and return transportation called at 2:45 by RN with [facility] to return. At 2:45 RN again called for transport and no answer- left a voice message at [facility]. At 3:30 RN again called and spoke with dispatch for pick up ASAP as ambulatory tower closes by 5pm. Assured that someone was on the way. At 4pm RN called and again left a voice meesgae. At 4:15 RN called and spoke with dispatch and again RN called at 4:50 pm and was assured that it would be within 30 minutes. At 5:30 RN spoke with Earvin Hansen dispatch at [facility] and we were told there was no record of previous calls, That there was 1 vehicle to transport nad was given a supervisor [employer]. I left a message. Earvin Hansen stated that driver was at [facility] location and would be at [facility] shortly. At 6:17 [facility] again contacted by RN. Earvin Hansen stated they should be at [facility]. [facility] drivers arrived on unit at 6:45 PM with a stretcher. When I spoke with the drivers (Vadim was one transporter and refused to give last name and name of partner) that this patient waited 4 hours for a pick up, they stated "call our supervisor",  "Only 1 vehicle". This Clinical research associate left a message with Rossie Muskrat Supervisor at [employer] and spoke with Vernona Rieger at [employer] who arranged this transportation, to make them aware of this unacceptable situation. Patient left [facility] via stretcher accompanied by home RN, that will ride in the [place] with patient.

## 2018-09-05 ENCOUNTER — Encounter: Payer: Self-pay | Admitting: Gastroenterology

## 2018-09-09 ENCOUNTER — Encounter: Payer: Self-pay | Admitting: Gastroenterology

## 2018-09-09 ENCOUNTER — Telehealth: Payer: Self-pay | Admitting: Family Medicine

## 2018-09-09 NOTE — Telephone Encounter (Signed)
Referral received.   Placed in office manager's mailbox.

## 2018-09-11 NOTE — Telephone Encounter (Signed)
Called the patients current primary care office, they were not aware that the patient was looking to transfer care. I asked that a message be sent to the provider and for them to give [place] a call if they have any questions or concerns.

## 2018-09-11 NOTE — Telephone Encounter (Signed)
Writer spoke to the patients father. Patient scheduled for NPV on 04/24 at 1:30 PM with Dr. [name].

## 2018-09-11 NOTE — Telephone Encounter (Signed)
Please call and schedule new patient appointment with Dr.Lie

## 2018-10-04 ENCOUNTER — Telehealth: Payer: Self-pay | Admitting: Pulmonology

## 2018-10-04 MED ORDER — ALBUTEROL SULFATE (2.5 MG/3ML) 0.083% IN NEBU *I*
2.5000 mg | INHALATION_SOLUTION | Freq: Four times a day (QID) | RESPIRATORY_TRACT | 3 refills | Status: DC | PRN
Start: 2018-10-04 — End: 2018-10-09

## 2018-10-04 NOTE — Telephone Encounter (Signed)
Copied from CRM [id number]. Topic: Medications/Prescriptions - Medication Question/Problem  >> Oct 04, 2018 11:09 AM Arlyce Harman wrote:  Tanya, the patient's day nurse, is calling to request a script from [place] for albuterol nebulizer solution, also known as Proventil, for the patient to inhale 2.5 mg into lungs be sent to [id number] - [place], [place] - [address].  She stated the patient's duo neb is no longer covered by the patient's insurance company.  [name] stated [facility] but they haven't received a response.  She mentioned the script should come from [place] because the patient recently saw her on 2/24.  Writer did not reorder medication    If needed, [name] can be reached at [phone]

## 2018-10-09 ENCOUNTER — Other Ambulatory Visit: Payer: Self-pay | Admitting: Pulmonology

## 2018-10-09 DIAGNOSIS — J45909 Unspecified asthma, uncomplicated: Secondary | ICD-10-CM

## 2018-10-09 MED ORDER — ALBUTEROL SULFATE (2.5 MG/3ML) 0.083% IN NEBU *I*
2.5000 mg | INHALATION_SOLUTION | Freq: Four times a day (QID) | RESPIRATORY_TRACT | 3 refills | Status: DC | PRN
Start: 2018-10-09 — End: 2019-02-06

## 2018-11-01 ENCOUNTER — Ambulatory Visit: Payer: Medicare Other | Admitting: Pediatrics

## 2018-11-15 ENCOUNTER — Telehealth: Payer: Self-pay | Admitting: Pulmonology

## 2018-11-15 NOTE — Telephone Encounter (Signed)
Copied from CRM [id number]. Topic: Access to Care - Medical Records Request  >> Nov 15, 2018  2:57 PM Lanette Hampshire D wrote:  Angel Wells patient's nurse is calling to request records from 09/02/18 office notes be faxed to [phone].     Angel Wells can be reached at [phone] with questions.    Has patient been informed of the need for written consent? N/A

## 2018-11-16 LAB — UNMAPPED LAB RESULTS
Basophil # (HT): 0 10 3/uL — NL (ref 0.0–0.2)
Basophil % (HT): 0 % — NL (ref 0–2)
Eosinophil # (HT): 0 10 3/uL — NL (ref 0.0–0.5)
Eosinophil % (HT): 0 % — NL (ref 0–7)
Hematocrit (HT): 37 % — ABNORMAL LOW (ref 40–52)
Hemoglobin (HGB) (HT): 13.3 g/dL — NL (ref 13.0–17.5)
Lymphocyte # (HT): 2.1 10 3/uL — NL (ref 0.9–3.8)
Lymphocyte % (HT): 23 % — NL (ref 17–44)
MCHC (HT): 36.1 g/dL — ABNORMAL HIGH (ref 32.0–36.0)
MCV (HT): 85 fL — NL (ref 81–99)
Mean Corpuscular Hemoglobin (MCH) (HT): 30.8 pg — NL (ref 26.0–34.0)
Monocyte # (HT): 0.4 10 3/uL — NL (ref 0.2–1.0)
Monocyte % (HT): 5 % — NL (ref 4–15)
Neutrophil # (HT): 6.5 10 3/uL — NL (ref 1.5–7.7)
Platelets (HT): 271 10 3/uL — NL (ref 140–400)
RBC (HT): 4.32 10 6/uL — ABNORMAL LOW (ref 4.60–6.20)
RDW (HT): 12.6 % — NL (ref 11.5–15.0)
Seg Neut % (HT): 71 % — NL (ref 43–75)
WBC (HT): 9.1 10 3/uL — NL (ref 4.0–10.0)

## 2018-11-19 DIAGNOSIS — Z9911 Dependence on respirator [ventilator] status: Secondary | ICD-10-CM | POA: Insufficient documentation

## 2018-11-22 NOTE — Telephone Encounter (Signed)
Writer faxed request documents to [name] (patient's nurse) at fax number [phone].

## 2018-12-04 ENCOUNTER — Telehealth: Payer: Self-pay

## 2018-12-04 NOTE — Telephone Encounter (Signed)
Is it appropriate for patient to be seen at [facility] or in ED with pre-existing conditions and bed dependence. Please advise

## 2018-12-05 ENCOUNTER — Ambulatory Visit: Payer: Medicare Other | Admitting: Student in an Organized Health Care Education/Training Program

## 2018-12-05 ENCOUNTER — Encounter: Payer: Self-pay | Admitting: Student in an Organized Health Care Education/Training Program

## 2018-12-05 VITALS — BP 134/78 | HR 95

## 2018-12-05 DIAGNOSIS — K122 Cellulitis and abscess of mouth: Secondary | ICD-10-CM

## 2018-12-05 MED ORDER — CLINDAMYCIN HCL 300 MG PO CAPS *I*
300.0000 mg | ORAL_CAPSULE | Freq: Three times a day (TID) | ORAL | 0 refills | Status: DC
Start: 2018-12-05 — End: 2018-12-24

## 2018-12-05 NOTE — Patient Instructions (Signed)
Incision and Drainage DISCHARGE INSTRUCTIONS  After a sedation procedure DO NOT drive, operate heavy machinery, drink alcoholic beverages, make important personal or business decisions, and/or sign legal documents for 24 hours.    1. First 30 minutes after procedure:   Bite on gauze to help stop bleeding as continued pressure is desired. If bleeding continues after 30 minutes, bite on fresh gauze as follows:  moisten the gauze then fold twice. Place gauze on extraction site(s) for 30 minutes without talking. Repeat as needed.    A small amount of blood tinged saliva is expected during the first 24 hours.    2. NO BRUSHING for the first 24 hours. Begin tooth brushing the day after surgery. Brush gently around the extraction site(s).    The day after surgery, begin warm salt water rinses after meals (or 4-5 times per day): 1/2 teaspoon of salt in an 8oz. glass of warm water.    A mouth rinse may be prescribed. If prescribed, use as instructed starting the day after surgery.      3. DO NOT USE STRAWS for 7 days after surgery.  Straws create suction pressure inside your mouth and could dislodge the blood clots that form in the extraction site(s).      4. DO NOT SMOKE during the first 21 days after surgery.  Smoking increases the risk of dry socket and other healing complications.     5. Stitches will dissolve in 7-10 days. If stitches fall out or become loose before 7-10 days, it does not need further attention. If stitches are not dissolvable a follow up visit will be made to remove them.     You may expect:  1. Mild to moderate pain may occur after the local anesthetic wears off. Avoid aspirin, this may lead to more bleeding.  For moderate to severe pain, take the prescription medication(s) as prescribed for you.  Do NOT operate heavy machinery, drink alcoholic beverages, or make important decisions while on narcotics.  2. Limited mouth opening due to swelling and tightness of jaw muscles.  3. Bruising may develop  after surgery.  Bruising should resolve after the first 7 to 10 days.     In case of uncontrolled bleeding, extreme pain, or unusual circumstances, please call the office immediately at [phone].  DIET   1. Avoid eating or drinking anything within the first hour after surgery. Avoid hot liquids and foods for the first 24 hours.    2. During the first week, avoid hard, crispy foods such as chips, hard pretzels, and popcorn.   3. Soft foods are recommended on the day of surgery. Return to a normal diet as tolerated unless otherwise instructed. The following food choices are recommended:   During the first few days: soup broth, well-cooked pasta, eggs, yogurt, cottage cheese, Jell-O, pudding, custard, applesauce, bananas and ice cream.     After the first few days, slowly increase your diet to include: fish, chicken, meatloaf, mashed potatoes, cooked vegetables, soups and cereals.

## 2018-12-05 NOTE — Procedures (Signed)
Chief Complaint   Patient presents with    Initial Evaluation     Evaluation right buccal space abscess    Procedure     I+D, right buccal space       The referral, consult documentation, imaging and finalized treatment plan were reviewed with Dr. Basilia Jumbo prior to the start of the procedure.    Medical history reviewed with patient/guardian and in chart without changes since consultation visit.        Discussed risks, benefits and alternatives extraction and the patient's HCP (father) gave consent over the phone with myself and Dr. Kristen Cardinal.    Time out performed. Biteblock placed.     Procedure:            # of carpules  2% Lidocaine with 1:100000 Epi  3  4% Septocaine with 1:100000 Epi 0    Local Anesthetic was administered via blocks and infiltrations. Lidocaine used for all blocks.    Throat screen placed.     An 18 gauge needle was used to attempt to aspirate the abscess without success. A 15 blade was used to make an incision through the buccal mucosa. The abscess cavity was entered bluntly with a hemostat with no further purulence obtained. The buccal space was explored bluntly to break any loculations. The area was irrigated with sterile saline. No drain was placed. Throat screen was removed. Gauze placed for hemostasis.     Complications:  None    Monitored in recovery until alert and vital signs stable then discharged without needing assistance.  Postop instructions and extra gauze given to pt and/or escort.     Rx: Clindamycin     Follow up appointment arranged for: 2 weeks

## 2018-12-05 NOTE — Invasive Procedure Plan of Care (Signed)
[facility]  [facility]  [facility]    CONSENT FOR MEDICAL  OR SURGICAL PROCEDURE                            Patient Name: Angel HotterJoshua Wells  [facility] [id number]                                                              DOB: 1990-10-17         Please read this form or have someone read it to you.   It's important to understand all parts of this form. If something isn't clear, ask us to explain.   When you sign it, that means you understand the form and give us permission to do this surgery or procedure.     I agree for Pollyann KennedySpenrath, Ballard Budney, DMD , and [facility] Attending  [facility] Residents along with any assistants* they may choose, to treat the following condition(s): Right buccal space infection; dental caries   By doing this surgery or procedure on me: - Intraoral drainage of right cheek abscess   This is also known as: - Intraoral drainage of right buccal space abscess. Possible penrose drain placement   Laterality:     *if you'd like a list of the assistants, please ask. We can give that to you.    1. The care provider has explained my condition to me. They have told me how the procedure can help me. They have told me about other ways of treating my condition. I understand the care provider cannot guarantee the result of the procedure. If I don't have this procedure, my other choices are: antibiotics only; no treatment    2. The care provider has told me the risks (problems that can happen) of the procedure. I understand there may be unwanted results. The risks that are related to this procedure include: Pain, bleeding, swelling, recurrent infection, damage to adjacent teeth or structures, paresthesia, facial muscle weakness, scarring, need for future additional procedures, risks of anesthesia    3. I understand that during the procedure, my care provider may find a condition that we didn't know about before the treatment started. Therefore, I agree that my care provider can perform any other  treatment which they think is necessary and available.    4. I understand the care provider may remove tissue, body parts, or materials during this procedure. These materials may be used to help with my diagnosis and treatment. They might also be used for teaching purposes or for research studies that I have separately agreed to participate in. Otherwise they will be disposed of as required by law.    5. My care provider might want a representative from a medical device company to be there during my procedure. I understand that person works for:          The ways they might help my care provider during my procedure include:            6. Here are my decisions about receiving blood, blood products, or tissues. I understand my decisions cover the time before, during and after my procedure, my treatment, and my time in the hospital. After my procedure, if my condition changes a lot,  my care provider will talk with me again about receiving blood or blood products. At that time, my care provider might need me to review and sign another consent form, about getting or refusing blood.    I understand that the blood is from the community blood supply. Volunteers donated the blood, the volunteers were screened for health problems. The blood was examined with very sensitive and accurate tests to look for hepatitis, HIV/AIDS, and other diseases. Before I receive blood, it is tested again to make sure it is the correct type.    My chances of getting a sickness from blood products are small. But no transfusion is 100% safe. I understand that my care provider feels the good I will receive from the blood is greater than the chances of something going wrong. My care provider has answered my questions about blood products.      My decision  about blood or  blood products           My decision   about tissue  Implants              I understand this  form.    My care provider  or his/her  assistants have  explained:   What I am having  done and why I need it.  What other choices I can make instead of having this done.  The benefits and possible risks (problems) to me of having this done.  The benefits and possible risks (problems) to me of receiving transplants, blood, or blood products.  There is no guarantee of the results.  The care provider may not stay with me the entire time that I am in the operating or procedure room.  My provider has explained how this may affect my procedure. My provider has answered my  questions about this.         I give my  permission for  this surgery or  procedure.            _______________________________________________                                     My signature  (or parent or other person authorized to sign for you, if you are unable to sign for  yourself or if you are under 77 years old)        ______           Date        _____        Time   Electronic Signatures will display at the bottom of the consent form.    Care provider's statement: I have discussed the planned procedure, including the possibility for transfusion of blood  products or receipt of tissue as necessary; expected benefits; the possible complications and risks; and possible alternatives  and their benefits and risks with the patients or the patient's surrogate. In my opinion, the patient or the patient's surrogate  understands the proposed procedure, its risks, benefits and alternatives.              Electronically signed by: Pollyann Kennedy, DMD                                                12/05/2018  Date        10:52 AM        Time

## 2018-12-05 NOTE — Progress Notes (Addendum)
Oral and Maxillofacial Surgery  Consult H&P Note      No chief complaint on file.      Past Medical History:   Diagnosis Date    Asthma     Chronic respiratory failure     Diabetes     Seizure disorder     TBI (traumatic brain injury)     Ventilator dependence     nocturnal only        No past surgical history on file.    Prior to Admission medications    Medication Sig Start Date End Date Taking? Authorizing Provider   clindamycin (CLEOCIN) 300 MG capsule Take 1 capsule (300 mg total) by mouth 3 times daily 12/05/18   Jimmy Picket, DMD   albuterol (PROVENTIL) (2.5 mg/51m) 0.083% nebulizer solution Take 3 mLs (2.5 mg total) by nebulization 4 times daily as needed for Wheezing 10/09/18   Parmenter, TSerafina Mitchell NP   acetaminophen (TYLENOL) 325 MG tablet Take 650 mg by mouth every 6 hours as needed for Pain    [provider]   generic DME Tracheostomy 7.0 Bivona TTS change every 3 months 07/23/18   Parmenter, TSerafina Mitchell NP   generic DME Cough Assist Machine Use as directed. 07/23/18   Parmenter, TSerafina Mitchell NP   generic DME Ventilator supplies 07/23/18   Parmenter, TSerafina Mitchell NP   generic DME Portable suction machine 07/23/18   Parmenter, TSerafina Mitchell NP   nebulizer device with mask and tubing Use as directed 07/23/18   PAzalee Course NP   oxygen 2 l/m bleed through ventilator at night 07/23/18   Parmenter, TSerafina Mitchell NP   ascorbic acid (VITAMIN C) 250 MG tablet Take 250 mg by mouth daily    [provider]   baclofen (LIORESAL) 10 MG tablet Take 10 mg by mouth 2 times daily 10/30/17   [provider]   Blood Glucose Monitoring Suppl (FIFTY50 GLUCOSE METER 2.0) w/Device KIT by Other route    [provider]   budesonide (PULMICORT) 0.25 MG/2ML nebulizer solution Inhale 0.25 mg into the lungs daily 10/30/17   [provider]   calcium-vitamin D (OYSTER SHELL CALCIUM/D) 500-200 MG-UNIT per tablet Take 1 tablet by mouth    [provider]   calcium carbonate (CALCIUM CARBONATE  ANTACID) 1250 (500 CA) MG/5ML suspension Take 1,250 mg by mouth daily 02/04/18   [provider]   ciprofloxacin (CIPRO) 500 MG tablet Take 500 mg by mouth every 12 hours 10/02/17   [provider]   clonazePAM (KLONOPIN) 1 MG tablet 1 mg tablet crushed and put through G tube as needed for cluster seizures (tx no more than 3 back to back sz) 02/04/18   [provider]   guaiFENesin-dextromethorphan (ROBITUSSIN DM) 100-10 MG/5ML syrup Take 10 mLs by mouth    [provider]   EPINEPHrine 0.15 mg/0.3 mL auto-injector Inject 0.15 mg into the muscle    [provider]   insulin detemir (LEVEMIR FLEXPEN) 100 UNIT/ML injection pen Inject 70 Units into the skin daily 11/21/17   [provider]   lactulose (LACTULOSE) 20 GM/30ML solution TAKE 15 MLS BY MOUTH 3 (THREE) TIMES DAILY. 12/05/17   [provider]   lancets ([place] 1 application by [place](Non-Drug; Combo Route) route 3 (three) times daily as needed. DX E11.65 11/07/17   [provider]   levETIRAcetam (KEPPRA) 100 MG/ML solution GIVE 15 MLS BY PER G TUBE ROUTE 2 (TWO) TIMES DAILY. 02/12/18  [provider]   miconazole (MICATIN) 2 % powder In groin 09/26/17   [provider]   valproate (VALPROIC ACID) 250 MG/5ML syrup Take 250 mg by mouth 3 times daily 12/14/17   [provider]   triamcinolone (KENALOG) 0.025 % cream Apply topically 2 times daily    [provider]   ranitidine (ZANTAC) 75 MG/5ML syrup Take 150 mg by mouth 2 times daily 10/30/17   [provider]   insulin pen needle (FIFTY50 PEN NEEDLES) 31G X 8 MM 1 each 09/10/17   [provider]   nystatin (MYCOSTATIN) powder Apply topically 4 times daily 11/07/17   [provider]   guaiFENesin (ROBITUSSIN) 100 MG/5ML syrup Take 10 mLs (200 mg total) by mouth 3 times daily as needed for Cough or Congestion 02/18/18   Parmenter, Serafina Mitchell, NP          Current Outpatient Medications    Medication    clindamycin (CLEOCIN) 300 MG capsule    albuterol (PROVENTIL) (2.5 mg/27m) 0.083% nebulizer solution    acetaminophen (TYLENOL) 325 MG tablet    generic DME    generic DME    generic DME    generic DME    nebulizer device with mask and tubing    oxygen    ascorbic acid (VITAMIN C) 250 MG tablet    baclofen (LIORESAL) 10 MG tablet    Blood Glucose Monitoring Suppl (FIFTY50 GLUCOSE METER 2.0) w/Device KIT    budesonide (PULMICORT) 0.25 MG/2ML nebulizer solution    calcium-vitamin D (OYSTER SHELL CALCIUM/D) 500-200 MG-UNIT per tablet    calcium carbonate (CALCIUM CARBONATE ANTACID) 1250 (500 CA) MG/5ML suspension    ciprofloxacin (CIPRO) 500 MG tablet    clonazePAM (KLONOPIN) 1 MG tablet    guaiFENesin-dextromethorphan (ROBITUSSIN DM) 100-10 MG/5ML syrup    EPINEPHrine 0.15 mg/0.3 mL auto-injector    insulin detemir (LEVEMIR FLEXPEN) 100 UNIT/ML injection pen    lactulose (LACTULOSE) 20 GM/30ML solution    lancets (ACCU-CHEK SOFTCLIX)    levETIRAcetam (KEPPRA) 100 MG/ML solution    miconazole (MICATIN) 2 % powder    valproate (VALPROIC ACID) 250 MG/5ML syrup    triamcinolone (KENALOG) 0.025 % cream    ranitidine (ZANTAC) 75 MG/5ML syrup    insulin pen needle (FIFTY50 PEN NEEDLES) 31G X 8 MM    nystatin (MYCOSTATIN) powder    guaiFENesin (ROBITUSSIN) 100 MG/5ML syrup     No current facility-administered medications for this visit.           Allergies:  Lorazepam; Piperacillin-tazobactam in dex; Sulfamethoxazole-trimethoprim; and Vancomycin    Social:  reports that he has never smoked. He has never used smokeless tobacco. He reports that he does not drink alcohol or use drugs.     Family Hx:  Family History   Problem Relation Age of Onset    Blood clots Father     Heart failure Father     Diabetes Father     Depression Father     GERD Father     Heart Disease Father     Hypertension Father        VS: There were no vitals taken for this visit.    [facility]   HPI: Angel Wells a 28y.o.male who was referred to the OMFS clinic for evaluation of a right buccal abscess. The patient has a history of severe TBI and is interactive, but not oriented. He has a long-term trach. He is vent-dependent overnight  and uses a speaking valve during the day. His nurse reports that he has frequent seizures, approximately 2x daily. The patient's nurse states that he started developing pain and swelling of his right cheek one week ago. The swelling continued to increase, prompting a visit to [place]. She reports that since then, his swelling has decreased considerably and he appears much more comfortable. He has been on clindamycin 32m TID for the last 5 days. Fever, chills, and any other s/s of systemic infection are denied at this time. Pain is currently rated as 0/10.    Review of Systems   Constitutional: Negative for chills and fever.   HENT: Negative for sore throat. Positive for oral lesions (as noted below)  Eyes: Negative for blurred vision and double vision.   Respiratory: Trached. Vent-dependent at night.  Cardiovascular: Negative for chest pain and palpitations.   Gastrointestinal: Negative for abdominal pain, nausea and vomiting.   Musculoskeletal: Quadriplegia.   Skin: Negative.    Neurological: Negative for sensory change, focal weakness, seizures.  Endo/Heme/Allergies: Does not bruise/bleed easily.   Psychiatric/Behavioral: Negative for substance abuse. Positive for nervous/anxious    Objective:              Physical Exam by Systems:   General: NAD, appears stated age  HEENT: NCAT, oral lesions as noted below  Neuro: AAOx3, no focal deficits     Extraoral Exam: No LAD, No TTP, TMJ WNL, no extraoral facial asymmetry noted.     Intraoral Exam: FOM soft and nonelevated, OP clear and uvula at midline,    MIO 447m Right buccal swelling with edema of the surrounding buccal tissue. Macerated buccal mucosa at the level of the occlusal plane. Purulent drainage  noted through an area of maceration on palpation. No restriction of jaw opening. Oral hygiene is good, dentition appears to be in good repair.    Mallampati: 4      Assessment: JoJery Hollerns a [age].o. male who presents today for evaluation of a right buccal abscess. Medical hx reviewed with pt and updated in chart. PMH significant for severe TBI, now with trach dependence, seizures, quadriplegia, PEG dependence. Patient not oriented, father is HCP. He has seizures approximately twice daily. He appears to be traumatizing the buccal tissues on the right side of his mouth, likely occurring as a result of his frequent seizures. He has a right buccal swelling today with spontaneous purulent drainage. Most likely, he developed a buccal abscess after traumatizing the cheek and it began draining yesterday. He has no extraoral swelling today and his nurse reports that he is significantly less swollen than yesterday. I+D was performed today, see procedure note for details. Phone consent was obtained from father via 2-provider consent with Dr. KiMaudie MercuryDue to the urgent nature of the procedure, COVID testing was not performed prior to the procedure. The patient displays no new respiratory or constitutional signs or symptoms concerning for COVID.    At least 30 mins of time spent with patient at this visit    Patient was seen and personally evaluated by Dr. GaLoraine Maple  Informed Consent:   I fully discussed the rationale, technical details, risks/benefits, and alternatives of surgery and anesthesia including no treatment as an option.    Preoperative and postoperative expectations were reviewed.   I reviewed with patient the risks and alternatives of Local, N2O, IV Sedation, and General Anesthesia.  I fully discussed the following potential complications: Pain, Bleeding, Swelling, Bruising, Infection, Numbness, and  Damage to Adjacent Structures as well as attempts to minimize these risks.  Questions were encouraged and answered  to the patient's apparent satisfaction.   The patient was given preoperative information.      All questions answered and pt left clinic in good condition. Patient was provided with a copy of the treatment plan and the AVS from today's visit.    Jimmy Picket, DMD  OMFS Resident     I saw and evaluated the patient.  I personally reviewed the case and agree with the resident's findings and plan of care as documented above.    20 minutes were spent with the patient, and patient's care providers.   Plan for I and D of the right buccal space abscess under [place].    Regan Lemming, DDS, MPH

## 2018-12-10 ENCOUNTER — Encounter: Payer: Self-pay | Admitting: Gastroenterology

## 2018-12-11 NOTE — Telephone Encounter (Signed)
Patient's RN Dorisann Frames requested a telemedicine appointment for patient's NPV with Dr. [name].   Zoom information can be sent to her e-mail  E-mail: [email or web address]

## 2018-12-19 ENCOUNTER — Encounter: Payer: Self-pay | Admitting: Student in an Organized Health Care Education/Training Program

## 2018-12-19 ENCOUNTER — Ambulatory Visit: Payer: Medicare Other | Admitting: Student in an Organized Health Care Education/Training Program

## 2018-12-19 VITALS — BP 130/85 | HR 74 | Temp 97.0°F

## 2018-12-19 DIAGNOSIS — K122 Cellulitis and abscess of mouth: Secondary | ICD-10-CM

## 2018-12-19 NOTE — Progress Notes (Signed)
Oral and Maxillofacial Surgery   Progress Note      Chief Complaint   Patient presents with    Follow-up     2 wks s/p I&D of R buccal space abscess       Past Medical History:   Diagnosis Date    Asthma     Chronic respiratory failure     Diabetes     Seizure disorder     TBI (traumatic brain injury)     Ventilator dependence     nocturnal only        History reviewed. No pertinent surgical history.    Prior to Admission medications    Medication Sig Start Date End Date Taking? Authorizing Provider   clindamycin (CLEOCIN) 300 MG capsule Take 1 capsule (300 mg total) by mouth 3 times daily 12/05/18   Jimmy Picket, DMD   albuterol (PROVENTIL) (2.5 mg/40m) 0.083% nebulizer solution Take 3 mLs (2.5 mg total) by nebulization 4 times daily as needed for Wheezing 10/09/18   Parmenter, TSerafina Mitchell NP   acetaminophen (TYLENOL) 325 MG tablet Take 650 mg by mouth every 6 hours as needed for Pain    [provider]   generic DME Tracheostomy 7.0 Bivona TTS change every 3 months 07/23/18   Parmenter, TSerafina Mitchell NP   generic DME Cough Assist Machine Use as directed. 07/23/18   Parmenter, TSerafina Mitchell NP   generic DME Ventilator supplies 07/23/18   Parmenter, TSerafina Mitchell NP   generic DME Portable suction machine 07/23/18   Parmenter, TSerafina Mitchell NP   nebulizer device with mask and tubing Use as directed 07/23/18   PAzalee Course NP   oxygen 2 l/m bleed through ventilator at night 07/23/18   Parmenter, TSerafina Mitchell NP   ascorbic acid (VITAMIN C) 250 MG tablet Take 250 mg by mouth daily    [provider]   baclofen (LIORESAL) 10 MG tablet Take 10 mg by mouth 2 times daily 10/30/17   [provider]   Blood Glucose Monitoring Suppl (FIFTY50 GLUCOSE METER 2.0) w/Device KIT by Other route    [provider]   budesonide (PULMICORT) 0.25 MG/2ML nebulizer solution Inhale 0.25 mg into the lungs daily 10/30/17   [provider]   calcium-vitamin D (OYSTER SHELL CALCIUM/D) 500-200 MG-UNIT per tablet Take 1  tablet by mouth    [provider]   calcium carbonate (CALCIUM CARBONATE ANTACID) 1250 (500 CA) MG/5ML suspension Take 1,250 mg by mouth daily 02/04/18   [provider]   ciprofloxacin (CIPRO) 500 MG tablet Take 500 mg by mouth every 12 hours 10/02/17   [provider]   clonazePAM (KLONOPIN) 1 MG tablet 1 mg tablet crushed and put through G tube as needed for cluster seizures (tx no more than 3 back to back sz) 02/04/18   [provider]   guaiFENesin-dextromethorphan (ROBITUSSIN DM) 100-10 MG/5ML syrup Take 10 mLs by mouth    [provider]   EPINEPHrine 0.15 mg/0.3 mL auto-injector Inject 0.15 mg into the muscle    [provider]   insulin detemir (LEVEMIR FLEXPEN) 100 UNIT/ML injection pen Inject 70 Units into the skin daily 11/21/17   [provider]   lactulose (LACTULOSE) 20 GM/30ML solution TAKE 15 MLS BY MOUTH 3 (THREE) TIMES DAILY. 12/05/17   [provider]   lancets ([place] 1 application by [place](Non-Drug; Combo Route) route 3 (three) times daily as needed. DX E11.65 11/07/17   [provider]  levETIRAcetam (KEPPRA) 100 MG/ML solution GIVE 15 MLS BY PER G TUBE ROUTE 2 (TWO) TIMES DAILY. 02/12/18   [provider]   miconazole (MICATIN) 2 % powder In groin 09/26/17   [provider]   valproate (VALPROIC ACID) 250 MG/5ML syrup Take 250 mg by mouth 3 times daily 12/14/17   [provider]   triamcinolone (KENALOG) 0.025 % cream Apply topically 2 times daily    [provider]   ranitidine (ZANTAC) 75 MG/5ML syrup Take 150 mg by mouth 2 times daily 10/30/17   [provider]   insulin pen needle (FIFTY50 PEN NEEDLES) 31G X 8 MM 1 each 09/10/17   [provider]   nystatin (MYCOSTATIN) powder Apply topically 4 times daily 11/07/17   [provider]   guaiFENesin (ROBITUSSIN) 100 MG/5ML syrup Take 10 mLs (200 mg total) by mouth 3 times daily as needed for Cough or  Congestion 02/18/18   Parmenter, Serafina Mitchell, NP          Current Outpatient Medications   Medication    clindamycin (CLEOCIN) 300 MG capsule    albuterol (PROVENTIL) (2.5 mg/50m) 0.083% nebulizer solution    acetaminophen (TYLENOL) 325 MG tablet    generic DME    generic DME    generic DME    generic DME    nebulizer device with mask and tubing    oxygen    ascorbic acid (VITAMIN C) 250 MG tablet    baclofen (LIORESAL) 10 MG tablet    Blood Glucose Monitoring Suppl (FIFTY50 GLUCOSE METER 2.0) w/Device KIT    budesonide (PULMICORT) 0.25 MG/2ML nebulizer solution    calcium-vitamin D (OYSTER SHELL CALCIUM/D) 500-200 MG-UNIT per tablet    calcium carbonate (CALCIUM CARBONATE ANTACID) 1250 (500 CA) MG/5ML suspension    ciprofloxacin (CIPRO) 500 MG tablet    clonazePAM (KLONOPIN) 1 MG tablet    guaiFENesin-dextromethorphan (ROBITUSSIN DM) 100-10 MG/5ML syrup    EPINEPHrine 0.15 mg/0.3 mL auto-injector    insulin detemir (LEVEMIR FLEXPEN) 100 UNIT/ML injection pen    lactulose (LACTULOSE) 20 GM/30ML solution    lancets (ACCU-CHEK SOFTCLIX)    levETIRAcetam (KEPPRA) 100 MG/ML solution    miconazole (MICATIN) 2 % powder    valproate (VALPROIC ACID) 250 MG/5ML syrup    triamcinolone (KENALOG) 0.025 % cream    ranitidine (ZANTAC) 75 MG/5ML syrup    insulin pen needle (FIFTY50 PEN NEEDLES) 31G X 8 MM    nystatin (MYCOSTATIN) powder    guaiFENesin (ROBITUSSIN) 100 MG/5ML syrup     No current facility-administered medications for this visit.           Allergies:  Lorazepam; Piperacillin-tazobactam in dex; Sulfamethoxazole-trimethoprim; and Vancomycin    S:   JJeyden Coffeltreturns for post op 2wk s/p I&D of R buccal space abscess. He is on a stretcher and is accompanied by his nurse ([name]. No acute events reported. Pt and his nurse report pain to be well controlled and minimal. They denied any drainage or swelling after the procedure. They added that patient has been compliant with abx course and oral  hygiene instructions.  Patient denies any fever, difficulty swallowing/breathing, N/V, or chest pain.           O:   Gen - NAD, no LAD, well appearing, calm and comfortable. Patient is on a stretcher.  EOE- unremarkable, no facial asymmetry, no swelling erythema or ecchymosis  IOE - Firmness of mucosa/muscle around I&D site (intraoral only). No erythema. The Incision area  has healed and is now closed.  No pain, drainage, purulence, or signs of infection.  No paresthesias.  Healing within normal limits.      A:   Kellan Raffield is a 28 y.o. male who presents 2wk s/p I&D of R buccal space abscess. He's doing well.  Patient is afebrile today and has afebrile during the past 2 weeks according to his nurse. No signs of acute infection. I&D site has healed with moderate firmness. Patient and the nurse were informed that the firmness is due to scar tissue formed due to surgery and will heal within few weeks.No further surgical intervention required at this time.    P:  -  No further surgical intervention indicated  -  Warm/moist compress to alleviate muscle tenderness     Case reviewed and discussed with Dr. Rexene Agent    Follow up:  PRN    Kiano Terrien B. Burton Apley, DDS  OMFS Intern

## 2018-12-20 ENCOUNTER — Encounter: Payer: Self-pay | Admitting: Gastroenterology

## 2018-12-24 ENCOUNTER — Encounter: Payer: Self-pay | Admitting: Pediatrics

## 2018-12-24 ENCOUNTER — Ambulatory Visit: Payer: Medicare Other

## 2018-12-24 ENCOUNTER — Ambulatory Visit: Payer: Medicare Other | Attending: Pediatrics | Admitting: Pediatrics

## 2018-12-24 DIAGNOSIS — S069XAA Unspecified intracranial injury with loss of consciousness status unknown, initial encounter: Secondary | ICD-10-CM

## 2018-12-24 DIAGNOSIS — J9690 Respiratory failure, unspecified, unspecified whether with hypoxia or hypercapnia: Secondary | ICD-10-CM

## 2018-12-24 DIAGNOSIS — S069X9A Unspecified intracranial injury with loss of consciousness of unspecified duration, initial encounter: Secondary | ICD-10-CM | POA: Insufficient documentation

## 2018-12-24 DIAGNOSIS — E1165 Type 2 diabetes mellitus with hyperglycemia: Secondary | ICD-10-CM | POA: Insufficient documentation

## 2018-12-24 DIAGNOSIS — Z9911 Dependence on respirator [ventilator] status: Secondary | ICD-10-CM

## 2018-12-24 DIAGNOSIS — J9611 Chronic respiratory failure with hypoxia: Secondary | ICD-10-CM | POA: Insufficient documentation

## 2018-12-24 DIAGNOSIS — G40909 Epilepsy, unspecified, not intractable, without status epilepticus: Secondary | ICD-10-CM | POA: Insufficient documentation

## 2018-12-24 DIAGNOSIS — Z794 Long term (current) use of insulin: Secondary | ICD-10-CM | POA: Insufficient documentation

## 2018-12-24 DIAGNOSIS — J45909 Unspecified asthma, uncomplicated: Secondary | ICD-10-CM | POA: Insufficient documentation

## 2018-12-24 MED ORDER — NEBULIZER/TUBING/MOUTHPIECE KIT *A*
PACK | 0 refills | Status: DC
Start: 2018-12-24 — End: 2019-06-12

## 2018-12-24 MED ORDER — GENERIC DME *A*
0 refills | Status: DC
Start: 2018-12-24 — End: 2019-06-12

## 2018-12-24 MED ORDER — ACCU-CHEK SOFTCLIX LANCETS MISC *A*
5 refills | Status: DC
Start: 2018-12-24 — End: 2019-07-01

## 2018-12-24 MED ORDER — MIC-KEY GASTROSTOMY KIT 14FR MISC *A*
3 refills | Status: DC
Start: 2018-12-24 — End: 2019-03-21

## 2018-12-24 MED ORDER — OXYGEN CONCENTRATOR DEVI *A*
0 refills | Status: DC
Start: 2018-12-24 — End: 2019-06-12

## 2018-12-24 NOTE — Progress Notes (Signed)
Met Wlliam's care team vis video visit. They are in the process of changing from [facility] to [facility].   Lue has an LTV vent (settings documented in Complex RT Flowsheet)He also has an Afflovest and a cough assist  Device. The Afflovest is not working well for him as he does not tolerate the vest fit. The family are going to try to return the Afflovest and get fit for a new vest next month when in the office and they can try multiple models for comfort, ease of use and tolerability. He is on Room Air when off the vent during the day, unless he is ill. At night on the vent he is on 1-2 lpm to maintain sats.   Spoke with contact from [facility] and they are working on getting all equipment changed over and will contact the family tomorrow. He is currently on Albuterol and Ipratropium as well as budesonide. I gave them instruction to do the first two while vesting and the third after airway clearance. When [name] comes off the vent in the morning he often desaturates requiring oxygen until he is up and stable.

## 2018-12-24 NOTE — Patient Instructions (Addendum)
Give albuterol while he is using his vest, followed by atrovent (ipratropium), then suction.  Give budesonide AFTER vesting and suctioning. You want the budesonide left in his lungs.    We will try and help facilitate [facility] as new supplier    We sent in new rx for accuchek softclix lancing device to [facility]    Please send [place] your updated med list to [phone] (fax)    Follow up in person next month
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
## 2018-12-24 NOTE — Progress Notes (Signed)
Home oxygen assessment:  Room air saturation:  88%   O2 sat on 2LPM was 95%

## 2018-12-25 ENCOUNTER — Other Ambulatory Visit: Payer: Self-pay

## 2018-12-25 ENCOUNTER — Encounter: Payer: Self-pay | Admitting: Gastroenterology

## 2018-12-25 NOTE — Progress Notes (Signed)
Patient is new to [facility]. Pharmacist performing medication reconciliation.     Walker Kehr, PharmD, BCPS  [phone]

## 2018-12-27 ENCOUNTER — Telehealth: Payer: Self-pay

## 2018-12-27 DIAGNOSIS — J9600 Acute respiratory failure, unspecified whether with hypoxia or hypercapnia: Secondary | ICD-10-CM

## 2018-12-27 DIAGNOSIS — Z9911 Dependence on respirator [ventilator] status: Secondary | ICD-10-CM

## 2018-12-27 MED ORDER — GENERIC DME *A*
5 refills | Status: DC
Start: 2018-12-27 — End: 2022-06-23

## 2018-12-27 MED ORDER — GENERIC DME *A*
5 refills | Status: DC
Start: 2018-12-27 — End: 2019-02-21

## 2018-12-27 MED ORDER — DISTILLED WATER PO LIQD
ORAL | 5 refills | Status: DC
Start: 2018-12-27 — End: 2022-06-23

## 2018-12-27 MED ORDER — GENERIC DME *A*
5 refills | Status: DC
Start: 2018-12-27 — End: 2020-12-30

## 2018-12-27 MED ORDER — GENERIC DME *A*
0 refills | Status: DC
Start: 2018-12-27 — End: 2019-06-12

## 2018-12-27 MED ORDER — GENERIC DME *A*
5 refills | Status: DC
Start: 2018-12-27 — End: 2020-04-14

## 2018-12-27 MED ORDER — GENERIC DME *A*
5 refills | Status: DC
Start: 2018-12-27 — End: 2019-06-12

## 2018-12-27 MED ORDER — GENERIC DME *A*
5 refills | Status: DC
Start: 2018-12-27 — End: 2019-02-06

## 2018-12-27 MED ORDER — SODIUM CHLORIDE 0.9 % IN NEBU *I*
3.0000 mL | INHALATION_SOLUTION | RESPIRATORY_TRACT | 5 refills | Status: DC | PRN
Start: 2018-12-27 — End: 2020-05-13

## 2018-12-27 MED ORDER — GENERIC DME *A*
3 refills | Status: DC
Start: 2018-12-27 — End: 2023-10-10

## 2018-12-27 MED ORDER — GENERIC DME *A*
5 refills | Status: DC
Start: 2018-12-27 — End: 2020-04-04

## 2018-12-27 MED ORDER — GENERIC DME *A*
5 refills | Status: DC
Start: 2018-12-27 — End: 2019-07-03

## 2018-12-27 MED ORDER — SURGILUBE EX GEL *I*
CUTANEOUS | 5 refills | Status: DC | PRN
Start: 2018-12-27 — End: 2022-06-23

## 2018-12-27 MED ORDER — CURITY GAUZE SPONGE 4"X4" PADS *A*
MEDICATED_PAD | 5 refills | Status: DC
Start: 2018-12-27 — End: 2022-06-23

## 2018-12-27 MED ORDER — GENERIC DME *A*
5 refills | Status: DC
Start: 2018-12-27 — End: 2019-01-24

## 2018-12-27 MED ORDER — ADHESIVE TAPE 1"X5YD TAPE
1.0000 [IU] | MEDICATED_TAPE | 5 refills | Status: DC | PRN
Start: 2018-12-27 — End: 2022-06-23

## 2018-12-27 NOTE — Telephone Encounter (Signed)
All new scripts required to change DME providers

## 2018-12-30 ENCOUNTER — Telehealth: Payer: Self-pay | Admitting: Pediatrics

## 2018-12-30 NOTE — Telephone Encounter (Signed)
[place]     Reason for call: [facility] RN [name] requested guidance on where to order new DME equipment. Caller stated that [facility] is unable to supply a few of the items that they need.   [name] is looking for recommendations for other suppliers and provided her phone number for a return call.     Name of caller: [name] for [employer]  Relationship to patient: Designer, industrial/product (if applicable): [facility]  Phone: [phone]

## 2018-12-31 NOTE — Telephone Encounter (Signed)
Tanya asking about vest and Mickey button. I told her to return the [employer] vest and we would fit Angel Wells for a new vest at his July appointment. I called our dietician and requested a call back with instructions for supplier for [facility] button.

## 2019-01-01 ENCOUNTER — Telehealth: Payer: Self-pay | Admitting: Pediatrics

## 2019-01-01 NOTE — Telephone Encounter (Signed)
Incoming paperwork for completion to the [place]  Date Received: 01/01/2019    Type of paperwork: Other - Revision to plan of care    Completed paperwork to be sent to:  Fax to [facility] [employer] [phone]    Special Requests:     Placed in box for: Lie

## 2019-01-03 ENCOUNTER — Telehealth: Payer: Self-pay | Admitting: Pediatrics

## 2019-01-03 NOTE — Telephone Encounter (Signed)
[place]     Reason for call: Anda Kraft wanted the provider to know she will be drawing blood either today or Tuesday for patient. That way the results are in before patient's appointment on the 14th. No call back is needed.     Name of caller: Anda Kraft for Mujahid Jalomo  Relationship to patient:RN  Phone:  [phone]

## 2019-01-03 NOTE — Telephone Encounter (Signed)
Per PCP- these orders are from prior to patient transitioning care to [facility].  Forwarded to previous PCP.

## 2019-01-07 ENCOUNTER — Other Ambulatory Visit
Admission: RE | Admit: 2019-01-07 | Discharge: 2019-01-07 | Disposition: A | Discharge status: Home / Self Care | Payer: Medicare Other | Source: Ambulatory Visit | Attending: Pediatrics | Admitting: Pediatrics

## 2019-01-07 ENCOUNTER — Telehealth: Payer: Self-pay | Admitting: Pediatrics

## 2019-01-07 DIAGNOSIS — Z794 Long term (current) use of insulin: Secondary | ICD-10-CM | POA: Insufficient documentation

## 2019-01-07 DIAGNOSIS — Z9911 Dependence on respirator [ventilator] status: Secondary | ICD-10-CM

## 2019-01-07 DIAGNOSIS — G40909 Epilepsy, unspecified, not intractable, without status epilepticus: Secondary | ICD-10-CM | POA: Insufficient documentation

## 2019-01-07 DIAGNOSIS — E1165 Type 2 diabetes mellitus with hyperglycemia: Secondary | ICD-10-CM | POA: Insufficient documentation

## 2019-01-07 LAB — VITAMIN D: 25-OH Vit Total: 52 ng/mL (ref 30–60)

## 2019-01-07 LAB — COMPREHENSIVE METABOLIC PANEL
ALT: 52 U/L — ABNORMAL HIGH (ref 0–50)
AST: 17 U/L (ref 0–50)
Albumin: 4.3 g/dL (ref 3.5–5.2)
Alk Phos: 57 U/L (ref 40–130)
Anion Gap: 19 — ABNORMAL HIGH (ref 7–16)
Bilirubin,Total: 0.3 mg/dL (ref 0.0–1.2)
CO2: 19 mmol/L — ABNORMAL LOW (ref 20–28)
Calcium: 9.7 mg/dL (ref 9.0–10.3)
Chloride: 97 mmol/L (ref 96–108)
Creatinine: 0.27 mg/dL — ABNORMAL LOW (ref 0.67–1.17)
GFR,Black: 222 *
GFR,Caucasian: 192 *
Glucose: 136 mg/dL — ABNORMAL HIGH (ref 60–99)
Lab: 6 mg/dL (ref 6–20)
Potassium: 3.9 mmol/L (ref 3.3–5.1)
Sodium: 135 mmol/L (ref 133–145)
Total Protein: 7.4 g/dL (ref 6.3–7.7)

## 2019-01-07 LAB — LIPID PANEL
Chol/HDL Ratio: 4.1
Cholesterol: 124 mg/dL
HDL: 30 mg/dL — ABNORMAL LOW (ref 40–60)
LDL Calculated: 70 mg/dL
Non HDL Cholesterol: 94 mg/dL
Triglycerides: 121 mg/dL

## 2019-01-07 LAB — TSH: TSH: 1.81 u[IU]/mL (ref 0.27–4.20)

## 2019-01-07 NOTE — Telephone Encounter (Signed)
[place]     Reason for call: [name] requested to speak with respiratory therapist. Osvaldo Human stated that they are still having issues with [employer]. They have not received the proper circuit for the patient for 2 months now and [employer] is advising that they put together a circuit from supplies in the home.     [name] provided her phone number and requested advice from RT.    Name of caller: [name] for [employer]  Relationship to patient: RN  Organization (if applicable): [employer]  Phone:  [phone]

## 2019-01-08 ENCOUNTER — Other Ambulatory Visit
Admission: RE | Admit: 2019-01-08 | Discharge: 2019-01-08 | Disposition: A | Discharge status: Home / Self Care | Payer: Medicare Other | Source: Ambulatory Visit | Attending: Pediatrics | Admitting: Pediatrics

## 2019-01-08 DIAGNOSIS — Z794 Long term (current) use of insulin: Secondary | ICD-10-CM | POA: Insufficient documentation

## 2019-01-08 DIAGNOSIS — G40909 Epilepsy, unspecified, not intractable, without status epilepticus: Secondary | ICD-10-CM | POA: Insufficient documentation

## 2019-01-08 DIAGNOSIS — E1165 Type 2 diabetes mellitus with hyperglycemia: Secondary | ICD-10-CM | POA: Insufficient documentation

## 2019-01-08 LAB — CBC AND DIFFERENTIAL
Baso # K/uL: 0 10*3/uL (ref 0.0–0.1)
Basophil %: 0.3 %
Eos # K/uL: 0.1 10*3/uL (ref 0.0–0.5)
Eosinophil %: 1 %
Hematocrit: 35 % — ABNORMAL LOW (ref 40–51)
Hemoglobin: 11.9 g/dL — ABNORMAL LOW (ref 13.7–17.5)
IMM Granulocytes #: 0.1 10*3/uL — ABNORMAL HIGH (ref 0.0–0.0)
IMM Granulocytes: 0.9 %
Lymph # K/uL: 2.4 10*3/uL (ref 1.3–3.6)
Lymphocyte %: 27.7 %
MCH: 30 pg/cell (ref 26–32)
MCHC: 34 g/dL (ref 32–37)
MCV: 88 fL (ref 79–92)
Mono # K/uL: 0.4 10*3/uL (ref 0.3–0.8)
Monocyte %: 4.9 %
Neut # K/uL: 5.7 10*3/uL — ABNORMAL HIGH (ref 1.8–5.4)
Nucl RBC # K/uL: 0 10*3/uL (ref 0.0–0.0)
Nucl RBC %: 0 /100 WBC (ref 0.0–0.2)
Platelets: 278 10*3/uL (ref 150–330)
RBC: 4 MIL/uL — ABNORMAL LOW (ref 4.6–6.1)
RDW: 13.2 % (ref 11.6–14.4)
Seg Neut %: 65.2 %
WBC: 8.8 10*3/uL (ref 4.2–9.1)

## 2019-01-08 LAB — COMPREHENSIVE METABOLIC PANEL
ALT: 50 U/L (ref 0–50)
AST: 19 U/L (ref 0–50)
Albumin: 4.3 g/dL (ref 3.5–5.2)
Alk Phos: 60 U/L (ref 40–130)
Anion Gap: 18 — ABNORMAL HIGH (ref 7–16)
Bilirubin,Total: 0.5 mg/dL (ref 0.0–1.2)
CO2: 18 mmol/L — ABNORMAL LOW (ref 20–28)
Calcium: 9.5 mg/dL (ref 9.0–10.3)
Chloride: 92 mmol/L — ABNORMAL LOW (ref 96–108)
Creatinine: 0.28 mg/dL — ABNORMAL LOW (ref 0.67–1.17)
GFR,Black: 218 *
GFR,Caucasian: 189 *
Glucose: 146 mg/dL — ABNORMAL HIGH (ref 60–99)
Lab: 5 mg/dL — ABNORMAL LOW (ref 6–20)
Potassium: 3.8 mmol/L (ref 3.3–5.1)
Sodium: 128 mmol/L — ABNORMAL LOW (ref 133–145)
Total Protein: 7.3 g/dL (ref 6.3–7.7)

## 2019-01-08 LAB — VITAMIN D: 25-OH Vit Total: 48 ng/mL (ref 30–60)

## 2019-01-08 LAB — TSH: TSH: 2.24 u[IU]/mL (ref 0.27–4.20)

## 2019-01-08 LAB — LIPID PANEL
Chol/HDL Ratio: 3.9
Cholesterol: 121 mg/dL
HDL: 31 mg/dL — ABNORMAL LOW (ref 40–60)
LDL Calculated: 69 mg/dL
Non HDL Cholesterol: 90 mg/dL
Triglycerides: 107 mg/dL

## 2019-01-09 LAB — HEMOGLOBIN A1C: Hemoglobin A1C: 5.5 %

## 2019-01-11 ENCOUNTER — Telehealth: Payer: Self-pay | Admitting: Pediatrics

## 2019-01-11 DIAGNOSIS — Z9911 Dependence on respirator [ventilator] status: Secondary | ICD-10-CM

## 2019-01-11 MED ORDER — AMOXICILLIN-POT CLAVULANATE 250-62.5 MG/5ML PO SUSR *I*
ORAL | 0 refills | Status: AC
Start: 2019-01-11 — End: 2019-01-21

## 2019-01-11 NOTE — Telephone Encounter (Signed)
[facility]  After Hours Phone Note      History   [name], RN, the patient's authorized caregiver of Angel Wells 27-May-1991 called reporting increased oxygen need, seems short of breath     PDN states he is rhonchorous, wheezes, O2 86-96, tried to get him back on the vent but it keeps alarming (typically uses vent at night)   Secretions - yellow thick started yesterday   Afebrile and other vitals are stable.    [name] does not think this is COVID though he does have PDN coming in and out of the house   Last bout of pneumonia was back in May, she states he typically gets clindamycin, prednisone and a chest xray      Assessment     27yo hx of TBI, chronic respiratory failure, trach dependent and intermittently dependent on vent who presents with 1 day history of increased secretions, increased pressure on vent alarm and mild hypoxia.     Plan      Will treat for presumed pneumonia with Augmentin for 14 days per gtube.  Will check in with him early next week and assess at that time if he may need a burst of steroids.  At this point would not get cxr as this would not change management.    [name], RN, the patient's authorized caregiver expressed understanding of plan and will call for further concerns     Angel Wells was instructed to call back if symptoms worsen, change or progress.      Doretha Imus, MD  01/11/2019  3:11 PM

## 2019-01-13 NOTE — Telephone Encounter (Signed)
Writer spoke to patient's [facility] RN, [name]. She reports patient seems to be doing better. Patient is still congested and has a cough but patient is in good spirits and able to eat. [name] is inquiring on whether patient needs a follow up appointment regarding this. [name] has requested a call back.    Phone: [phone]

## 2019-01-14 ENCOUNTER — Other Ambulatory Visit: Payer: Self-pay | Admitting: Pediatrics

## 2019-01-14 MED ORDER — IPRATROPIUM BROMIDE 0.02 % IN SOLN *I*
500.0000 ug | Freq: Four times a day (QID) | RESPIRATORY_TRACT | 5 refills | Status: DC
Start: 2019-01-14 — End: 2019-01-20

## 2019-01-14 NOTE — Telephone Encounter (Signed)
Spoke to [place], she is concerned that the switch hasn't happened to [facility]. I called Katharine Look from [facility] and the move is in process. [facility] is sending out a supply order even though they cannot bill yet as [name] has been unable to get supplies from [place]. Katharine Look will check on the status and call Tonya to follow up.

## 2019-01-14 NOTE — Telephone Encounter (Signed)
Spoke with Angel Wells, she states that she feels secretions have much improved. She did note some comments from staff overnight felt things were thicker but states she had no problems during the day and wonder if this is just things not moving as much with him on the vent during the night.  Angel Wells states again that things were much better than over the weekend, noting that he no longer has the yellow thick mucous he was producing and she feels he is much better.    Angel Wells also asked if an earlier follow up was needed.  Discussed that as it sounds that Angel Wells has improved we can update Dr Shanda Howells and for now keep his current 7/14 appointment.  If the Angel Wells begins to have greater concern over Angel Wells decompensates they can call and we can reschedule.  Alternately if Dr Shanda Howells feels that he should be seen sooner we will contact them.  Angel Wells verbalized understanding and states they will call if any more concerns or questions.

## 2019-01-14 NOTE — Telephone Encounter (Signed)
Writer spoke to [employer]. She is requesting to speak to RT Butch Penny regarding switching companies to [facility]. [name] has requested a call back.    Phone: [phone]

## 2019-01-15 ENCOUNTER — Telehealth: Payer: Self-pay | Admitting: Pediatrics

## 2019-01-15 IMAGING — DX DG CHEST 1V PORT
1 series · 1 of 1 positions shown · non-contrast
Comparison: Portable chest x-ray December 11, 2016

CLINICAL DATA: Respiratory failure, community acquired pneumonia,
sepsis; history of quadriplegia following brain injury, diabetes,
asthma

EXAM:
PORTABLE CHEST 1 VIEW

[chest ap]
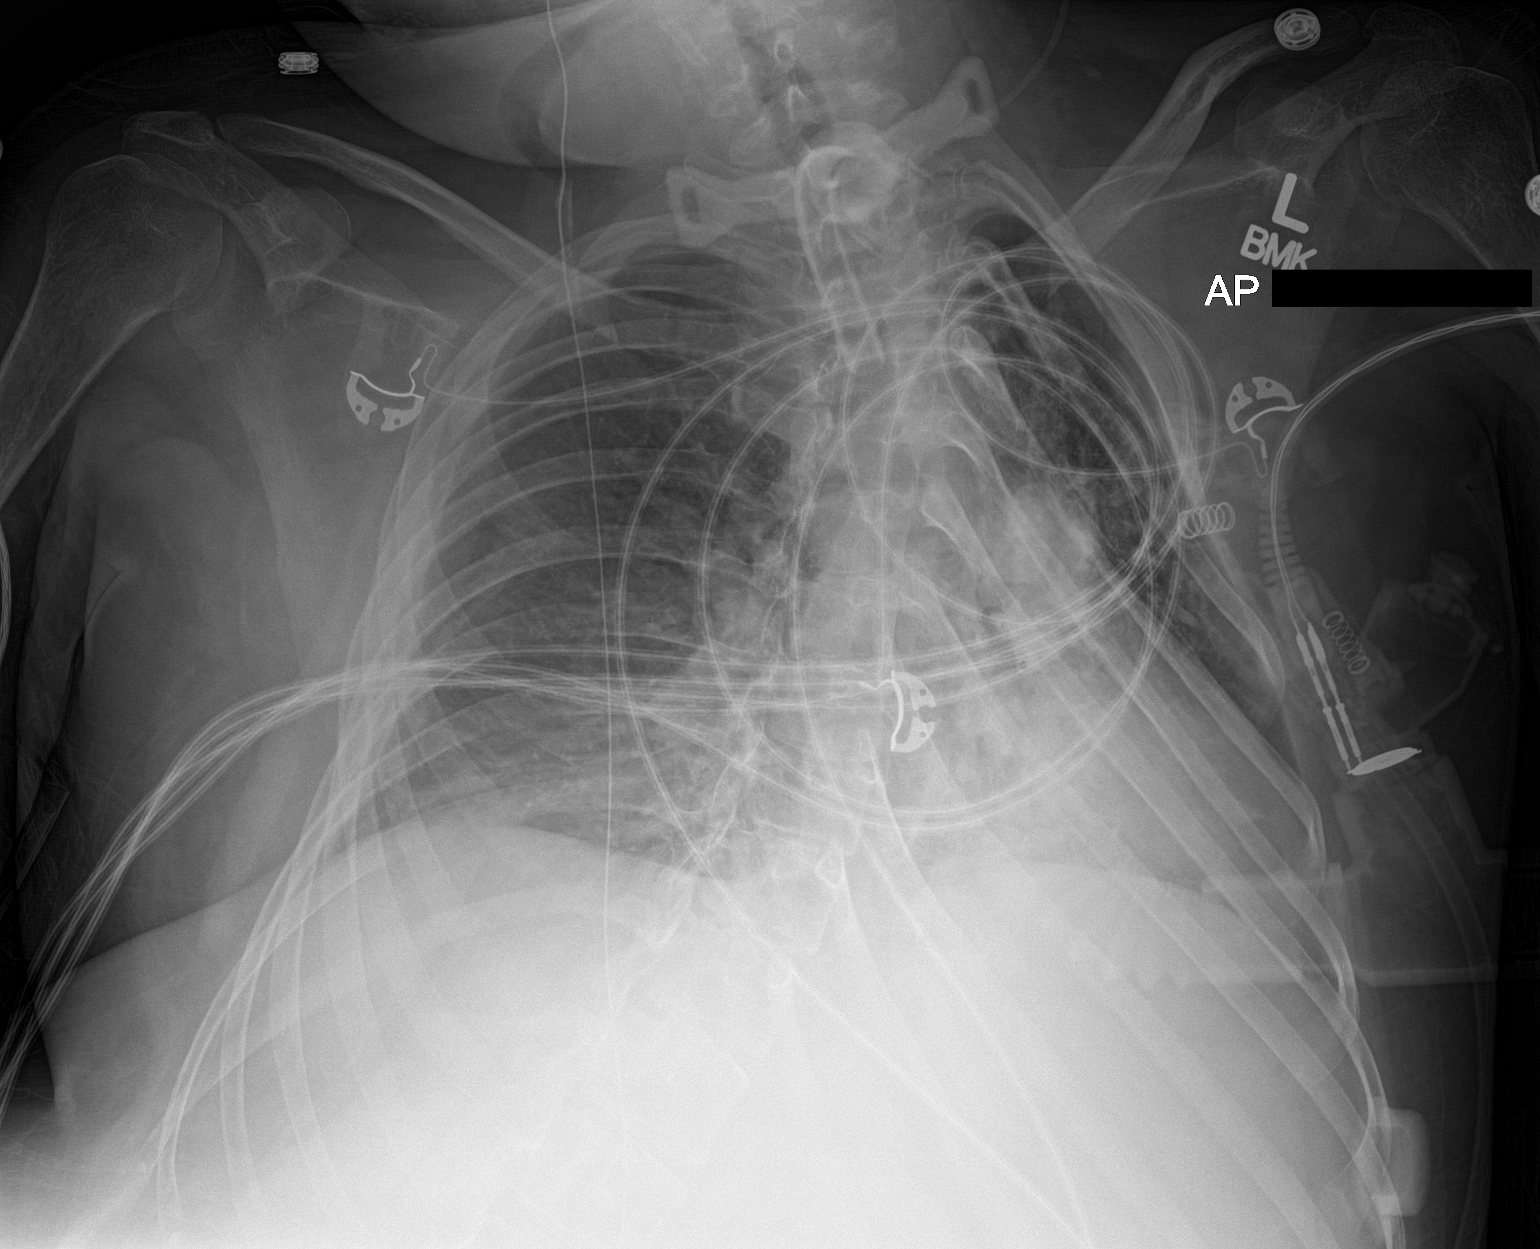

[1 of 1 positions shown; findings below may reference images not displayed]

FINDINGS: Aeration of the left lung has improved. There is persistent left
basilar atelectasis or pneumonia. There is probable subsegmental
atelectasis at the right lung base which is stable. The heart is
top-normal in size. The pulmonary vascularity is not clearly
engorged. The tracheostomy tube tip lies 3.1 cm above the carina. A
ventriculoperitoneal shunt tube is visible to the right of midline.
IMPRESSION: Improved appearance of the left lung consistent with improving
atelectasis or pneumonia. There is persistent left lower lobe
density consistent with residual pneumonia. Minimal atelectasis at
the right lung base is suspected.

## 2019-01-15 NOTE — Telephone Encounter (Signed)
Attempted to contact [name] back from [facility] [employer] regarding orders per Dr Shanda Howells for [place].  Call went to voice mail, left general message for [name] to contact the office back.  Call back number provided.

## 2019-01-15 NOTE — Telephone Encounter (Signed)
[name] from [place] requested a return call regarding patient's loose stools when possible.   She provided her cell phone number: [phone]

## 2019-01-15 NOTE — Telephone Encounter (Signed)
If he still is having diarrhea, would recommend at least 230ml of NS per gtube daily. If diarrhea gets worse I would give 539ml per day until diarrhea resolves.

## 2019-01-15 NOTE — Telephone Encounter (Signed)
[place]     Reason for call: [name] states the patient seems to be doing better after being prescribed Augmentin. [name] reports the patients BP is 118/70, HR 80, and oxygen saturation on vent is 97 on 2 liters. [name] states the patients secretions are slightly yellow. Per [name], the patients fluids have been increased due to having diarrhea five times at night. [name] requested to speak to a nurse or the patients provider. [name] inquired how the patients provider would like to continue treating the patient.      Name of caller: [name] for [employer]  Relationship to patient: Clinical Director  Organization: [facility]  Phone: [phone]

## 2019-01-16 ENCOUNTER — Encounter: Payer: Self-pay | Admitting: Gastroenterology

## 2019-01-16 MED ORDER — LACTOBACILLUS RHAMNOSUS (GG) PO CAPS *I*
1.0000 | ORAL_CAPSULE | Freq: Every day | ORAL | 0 refills | Status: DC
Start: 2019-01-16 — End: 2019-04-23

## 2019-01-16 MED ORDER — GENERIC DME *A*
0 refills | Status: DC
Start: 2019-01-16 — End: 2019-02-06

## 2019-01-16 NOTE — Addendum Note (Signed)
Addended by: Doretha Imus on: 01/16/2019 02:02 PM     Modules accepted: Orders

## 2019-01-16 NOTE — Telephone Encounter (Signed)
D/C order sent to [facility]

## 2019-01-16 NOTE — Telephone Encounter (Signed)
Patient's day nurse [name] reported that [employer] is refusing to take the patient's vest without a d/c order.  She is requesting a d/c order be sent to [place] so they can pick the vest up.     Phone: [phone]    Patient's father: [phone]

## 2019-01-16 NOTE — Addendum Note (Signed)
Addended by: Doretha Imus on: 01/16/2019 03:54 PM     Modules accepted: Orders

## 2019-01-16 NOTE — Addendum Note (Signed)
Addended by: Priscella Mann on: 01/16/2019 03:47 PM     Modules accepted: Orders

## 2019-01-17 MED ORDER — ZINC OXIDE 40 % EX PSTE *I*
PASTE | CUTANEOUS | 1 refills | Status: DC | PRN
Start: 2019-01-17 — End: 2020-05-13

## 2019-01-17 NOTE — Telephone Encounter (Signed)
I will send in zinc oxide for the butt cream.  It sounds like he is improving on this antibiotic and I think we should NOT change the antibiotic, please relay message to [facility], thank you

## 2019-01-17 NOTE — Addendum Note (Signed)
Addended by: Doretha Imus on: 01/17/2019 11:43 AM     Modules accepted: Orders

## 2019-01-17 NOTE — Telephone Encounter (Signed)
Spoke to [employer], Interior and spatial designer at [employer].  Reported seeing [name] yesterday.  Reporting pulse 70-90's, 94-96% on RA.  Having expiratory wheezing and rhoncorous lungs.   Still suctioning yellow mucus.  Denies fever.  [name] appearing more alert and in good spirits ("he's very funny").  Diarrhea has transitioned to soft stools.  Receiving about 1870mL/d of NS.    [name] requested a new cream for [name]'s bottom.  Red from previous days of diarrhea.  No open wounds.  Also wondering if he needs a change to his abx (still has approx week left on augmentin).    [name] updated that he was transitioned to [facility] for Respiratory needs and changed vent settings yesterday as well.

## 2019-01-20 ENCOUNTER — Other Ambulatory Visit: Payer: Self-pay | Admitting: Pediatrics

## 2019-01-20 ENCOUNTER — Telehealth: Payer: Self-pay | Admitting: Pediatrics

## 2019-01-20 MED ORDER — IPRATROPIUM BROMIDE 0.02 % IN SOLN *I*
500.0000 ug | Freq: Four times a day (QID) | RESPIRATORY_TRACT | 5 refills | Status: DC
Start: 2019-01-20 — End: 2019-01-22

## 2019-01-20 NOTE — Telephone Encounter (Addendum)
Per pharmacy- "need new Rx with diagnosis code in order to run through medicare part B. Thank you"

## 2019-01-20 NOTE — Telephone Encounter (Signed)
Incoming paperwork for completion to the [place]  Date Received: 01/20/2019    Type of paperwork: Other - Revision to plan of care    Completed paperwork to be sent to:  Fax to Nurse core [phone]    Special Requests:     Placed in box for: Lie

## 2019-01-21 ENCOUNTER — Encounter: Payer: Self-pay | Admitting: Pediatrics

## 2019-01-21 ENCOUNTER — Ambulatory Visit: Payer: Medicare Other | Attending: Pediatrics | Admitting: Pediatrics

## 2019-01-21 VITALS — BP 98/64 | HR 85 | Temp 98.7°F | Ht 65.0 in | Wt 155.6 lb

## 2019-01-21 DIAGNOSIS — Z794 Long term (current) use of insulin: Secondary | ICD-10-CM

## 2019-01-21 DIAGNOSIS — G8250 Quadriplegia, unspecified: Secondary | ICD-10-CM | POA: Insufficient documentation

## 2019-01-21 DIAGNOSIS — S069XAA Unspecified intracranial injury with loss of consciousness status unknown, initial encounter: Secondary | ICD-10-CM

## 2019-01-21 DIAGNOSIS — G40909 Epilepsy, unspecified, not intractable, without status epilepticus: Secondary | ICD-10-CM | POA: Insufficient documentation

## 2019-01-21 DIAGNOSIS — S069X9A Unspecified intracranial injury with loss of consciousness of unspecified duration, initial encounter: Secondary | ICD-10-CM | POA: Insufficient documentation

## 2019-01-21 DIAGNOSIS — J9611 Chronic respiratory failure with hypoxia: Secondary | ICD-10-CM | POA: Insufficient documentation

## 2019-01-21 DIAGNOSIS — E1165 Type 2 diabetes mellitus with hyperglycemia: Secondary | ICD-10-CM | POA: Insufficient documentation

## 2019-01-21 MED ORDER — FLUCONAZOLE 150 MG PO TABS *I*
ORAL_TABLET | ORAL | 0 refills | Status: AC
Start: 2019-01-21 — End: 2019-01-22

## 2019-01-21 NOTE — Progress Notes (Addendum)
[facility]  [address]  [place] [address]  Phone: [phone]  Fax: [phone]     REASON FOR VISIT      Follow up    PRIMARY DIAGNOSIS      TBI, chronic respiratory failure     SUBJECTIVE      Angel Wells was accompanied by parent and [name] (nurse) who provided some of subjective history     TBI - currently still waiting on ceiling hoyer lift to be installed at the house once that is in place, he typically spends more time in a chair or recliner but currently he is bed bound at home.     DM2 - fasting 114, usually sugars premeal are below 200.  His short acting is only for if BG>200.  Last A1c at goal 5.5.  Perhaps we could decrease his levemir but would like to get more data as far as his postprandial sugars    Weight- today 155.6, Dad reports this is stable for him    Chronic resp failure - recently on treatment for presumed pneumonia given increased secretions, [name] reports this is greatly improved, still having soft stool but his breathing has been more stable and less rhonchorous. He has a pretty bad skin rash on his bottom though due to the frequent soft stool    Dad has not had any luck with getting supports through [facility].  He is open to our care Technical sales engineer helping him navigate this, he is also trying to get guardianship in place for [place]        Review of Systems   Constitutional: Negative.    Respiratory: Positive for sputum production.    Cardiovascular: Negative.    Gastrointestinal: Negative for blood in stool.        Soft and frequent stool     Genitourinary: Negative.    Musculoskeletal: Negative.    Skin: Positive for rash.   Neurological: Negative.    Psychiatric/Behavioral: Negative.      Review of Systems as per HPI above    Past Medical History, Social History, Family History, and Medications/allergies reviewed during this visit    Current Outpatient Medications   Medication    ipratropium (ATROVENT) 0.02 % nebulizer solution    lactobacillus rhamnosus, GG,  (CULTURELLE) capsule    amoxicillin-clavulanate (AUGMENTIN) 250-62.5 MG/5ML suspension    generic DME    generic DME    generic DME    generic DME    generic DME    generic DME    generic DME    Distilled Water LIQD    generic DME    generic DME    generic DME    generic DME    generic DME    generic DME    generic DME    generic DME    generic DME    generic DME    generic DME    generic DME    generic DME    generic DME    generic DME    Calcium Carb-Cholecalciferol (CALCIUM 600+D) 600-800 MG-UNIT TABS    famotidine (PEPCID) 20 MG tablet    FIBER SELECT GUMMIES PO    Multiple Vitamins-Minerals (HM MULTIVITAMIN ADULT GUMMY PO)    Ascorbic Acid (VITAMIN C ADULT GUMMIES PO)    Cholecalciferol (VITAMIN D3) 50 MCG (2000 UT) capsule    cetirizine (ZYRTEC) 1 MG/ML syrup    Insulin Aspart (NOVOLOG FLEXPEN SC)    generic DME    generic  DME    nebulizer device with mask and tubing    oxygen concentrator    Feeding Tubes - Sets (MIC-KEY GASTROSTOMY KIT 14FR) MISC    Lancet Devices (SIMPLE DIAGNOSTICS LANCING DEV) MISC    generic DME    generic DME    oxygen    budesonide (PULMICORT) 0.25 MG/2ML nebulizer solution    insulin detemir (LEVEMIR FLEXPEN) 100 UNIT/ML injection pen    lactulose (LACTULOSE) 20 GM/30ML solution    levETIRAcetam (KEPPRA) 100 MG/ML solution    valproate (VALPROIC ACID) 250 MG/5ML syrup    insulin pen needle (FIFTY50 PEN NEEDLES) 31G X 8 MM    zinc oxide (DESITIN) 40 % paste    generic DME    sodium chloride 0.9 % nebulizer solution    gauze sponge (CURITY) 4"X4" pads    Adhesive Tape 1"x5yd TAPE    surgical lubricant (SURGILUBE) gel    diphenhydrAMINE (BENADRYL) 25 MG oral solid    lancets (ACCU-CHEK SOFTCLIX)    albuterol (PROVENTIL) (2.5 mg/70m) 0.083% nebulizer solution    baclofen (LIORESAL) 10 MG tablet    Blood Glucose Monitoring Suppl (FIFTY50 GLUCOSE METER 2.0) w/Device KIT    clonazePAM (KLONOPIN) 1 MG tablet    EPINEPHrine 0.15  mg/0.3 mL auto-injector    miconazole (MICATIN) 2 % powder    triamcinolone (KENALOG) 0.025 % cream    guaiFENesin (ROBITUSSIN) 100 MG/5ML syrup     No current facility-administered medications for this visit.          OBJECTIVE      BP 98/64    Pulse 85    Temp 37.1 C (98.7 F)    Ht 1.651 m ('5\' 5"' )    SpO2 98%    BMI 30.79 kg/m     Physical Exam   Constitutional: He is oriented to person, place, and time.   Patient laying in EMS stretcher   Musculoskeletal:      Comments: Thoracic scoliosis, cannot move lower extremities, limited movement of upper extremities RUE >LUE, has flexion contractures at the wrist, no witnessed spasms    Neurological: He is alert and oriented to person, place, and time. GCS score is 15.   Hyperreflexic in patellar/achilles reflex bilateral   Skin: Rash noted.   Excoriation and pustular erythematous rash over buttocks and rectal area, no sacral ulcer seen   Psychiatric:   Cognition level per Dad around [place].    Diabetic foot exam:   Neurologic: Response to tactile sensation was normal                     Monofilament test was abnormal, intact in toes but not in balls of feet bilaterally  Skin:   Lesion on the feet were absent             Lesion on the toes were absent  Pulses: DGL:OVFIEPP               TP: present    ASSESSMENT / DJeromewas seen today for new patient visit.    Diagnoses and all orders for this visit:    TBI (traumatic brain injury), cognition level around age 784-9yo will have our care manager help with navigating [facility] and help with guardianship    Seizure disorder - stable, neuro referral pending (awaiting previous neuro notes)    Type 2 diabetes mellitus with hyperglycemia, with long-term current use of insulin - will check post  prandial sugar for one week, given HbA1c may be able to decrease levemir dose    Chronic respiratory failure with hypoxia - STOP antibiotics given the rash and his symptoms have cleared  Will give fluconazole x  1 for the yeast type rash  Has follow up scheduled with pulmonology          Orders Placed This Encounter   No orders placed during this encounter.     There are no Patient Instructions on file for this visit.       --Patient instructed to call if symptoms are not improving or worsening  --Follow-up arranged  No follow-ups on file.     Electronically signed by Doretha Imus, MD , 01/21/2019 @   [facility], Phone: [phone]

## 2019-01-21 NOTE — Progress Notes (Signed)
Angel Wells and RN Tonya  Diarrhea still daily, little consistency (not pure water).  I Deboraha Sprang, RN, personally documented the review of systems and the past medical, family, social history. I obtained history of present illness and presented to the billing provider.

## 2019-01-21 NOTE — Patient Instructions (Addendum)
STOP the antibiotics  Please give fluconazole to him for the diaper rash  His weight today is 155.6lb  Please check your records for when he last had pneumovax and tetanus booster.    For next week, please check a post prandial (2 hours after a meal) once daily.

## 2019-01-22 ENCOUNTER — Telehealth: Payer: Self-pay | Admitting: Pediatrics

## 2019-01-22 ENCOUNTER — Telehealth: Payer: Self-pay

## 2019-01-22 DIAGNOSIS — Z930 Tracheostomy status: Secondary | ICD-10-CM

## 2019-01-22 DIAGNOSIS — Z9911 Dependence on respirator [ventilator] status: Secondary | ICD-10-CM

## 2019-01-22 MED ORDER — GENERIC DME *A*
0 refills | Status: DC
Start: 2019-01-22 — End: 2019-02-07

## 2019-01-22 NOTE — Telephone Encounter (Addendum)
AVS printed, will have provider sign.  Plan to fax to [facility] per her request.  Confirmed sent received.

## 2019-01-22 NOTE — Addendum Note (Signed)
Addended by: Virl Axe on: 01/22/2019 04:14 PM     Modules accepted: Orders

## 2019-01-22 NOTE — Telephone Encounter (Signed)
Writer spoke to [employer]. She would like a return call once diagnosis code has been added and sent in.     Phone: [phone]

## 2019-01-22 NOTE — Progress Notes (Signed)
I spoke to Dad, I am going to reach out to [facility] to see what happened with the eligibility request.    They have a care manager with their [place] she in  the process of helping the family get a ceiling tract hoyer.I will follow up when I have an answer from [facility].

## 2019-01-22 NOTE — Telephone Encounter (Signed)
[place]     Reason for call: [name] states patient was seen yesterday and there were some medication changes. [name] states she could take the office note from yesterday but she prefers orders of the medication changed signed and faxed to her. [name] provided fax number and phone number for call back if needed.     Name of caller: [name] for [employer] [employer]ing  Relationship to patient: Clinical director  Organization (if applicable): [facility]  Phone: [phone]  Fax (if applicable): [phone]

## 2019-01-22 NOTE — Telephone Encounter (Signed)
Nurse Core reporting the suction machine for Angel Wells is not functioning properly, new order sent.

## 2019-01-22 NOTE — Telephone Encounter (Signed)
Writer spoke to staff from Pharmacy. They are requesting a diagnosis code for this medication.     Phone: [phone]

## 2019-01-23 ENCOUNTER — Telehealth: Payer: Self-pay | Admitting: Pediatrics

## 2019-01-23 MED ORDER — IPRATROPIUM BROMIDE 0.02 % IN SOLN *I*
500.0000 ug | Freq: Four times a day (QID) | RESPIRATORY_TRACT | 5 refills | Status: DC
Start: 2019-01-23 — End: 2019-02-06

## 2019-01-23 NOTE — Telephone Encounter (Signed)
[place]     Reason for call: [name] reports last office visit it was discussed provider wanted to stop patient's antibiotics. On the medication list it states to continue taking it, [name] is inquiring whether or not patient is suppose to take it. Also, [name] is requesting a d/c of Benadryl since patient is taking Zyrtec. [name] states the updated medication list can be faxed to her. Any questions can be addressed to the number provider.     Name of caller: [name] for [employer] [employer]  Relationship to patient: RN  Organization (if applicable): nurse core  Phone:  [phone]  Fax: [phone]

## 2019-01-23 NOTE — Telephone Encounter (Signed)
Angel Wells reported the orders she received stated the patient has a metal trach and they are changing it every 3 months.   Angel Wells clarified that patient has a [place] trach and they change it every 2 weeks PRN.  She requested updated order be faxed to: [phone]

## 2019-01-24 ENCOUNTER — Other Ambulatory Visit: Payer: Self-pay | Admitting: Pediatrics

## 2019-01-24 DIAGNOSIS — Z9911 Dependence on respirator [ventilator] status: Secondary | ICD-10-CM

## 2019-01-24 DIAGNOSIS — J9600 Acute respiratory failure, unspecified whether with hypoxia or hypercapnia: Secondary | ICD-10-CM

## 2019-01-24 NOTE — Telephone Encounter (Signed)
Patient's care manager through [facility] is requesting a Rx so that patient can receive more gloves/month.   Lattie Haw is trying to advocate for them to receive 2-3 boxes instead of 1.     She requested we fax this prescription to her. She is sending a release of information to our office.   Fax: [phone]

## 2019-01-27 ENCOUNTER — Encounter: Payer: Self-pay | Admitting: Gastroenterology

## 2019-01-27 MED ORDER — GENERIC DME *A*
5 refills | Status: DC
Start: 2019-01-27 — End: 2021-06-06

## 2019-01-27 NOTE — Telephone Encounter (Signed)
Updated med list faxed  Confirmation received

## 2019-01-27 NOTE — Progress Notes (Signed)
[facility] nurse calling for clarification:  Instructions direct them to stop antibiotics, but the AVS appears to show guaifenesin being discontinued.  I see the AVS indicates discontinuation, but the medication remains on the active med list.    Directed to continue with this sputum thinning medication for now, along with fluconazole. Reiterate to stop/hold antibiotics.    Hulan Fess, MD

## 2019-01-27 NOTE — Telephone Encounter (Signed)
Please send updated med list.  I will keep diphenydramine on his list as this is a PRN for sleep (not allergies) and I do not have this as a regularly scheduled medicine.  Antibiotic is no longer on the list.

## 2019-01-28 ENCOUNTER — Encounter: Payer: Self-pay | Admitting: Gastroenterology

## 2019-01-28 ENCOUNTER — Telehealth: Payer: Self-pay | Admitting: Pediatrics

## 2019-01-28 NOTE — Telephone Encounter (Signed)
Incoming paperwork for completion to the [place]  Date Received: 01/28/2019    Type of paperwork: Other - Practitioner Note    Completed paperwork to be sent to:  Fax to [facility] [phone]    Special Requests:     Placed in box for: Shanda Howells

## 2019-01-28 NOTE — Telephone Encounter (Signed)
Faxed script to [facility] at [place]

## 2019-01-28 NOTE — Telephone Encounter (Signed)
[place]     Reason for call: [name] RN requested clarification on patient's guaifenesin. [name] reported patient is currently taking 400mg  tablets twice daily and PRN for cough/congestion.   He is not taking the syrup.  Tonya requested to clarify this order and provided her phone number for a return call.       Name of caller: [name] for [facility] Handing  Relationship to patient: Day RN  Phone: [phone]

## 2019-01-28 NOTE — Telephone Encounter (Signed)
Paperwork completed 01/28/2019  Sent as outlined in prior documentation

## 2019-01-29 NOTE — Telephone Encounter (Signed)
Spoke with [place].  Adjusted med list to represent which guaifenesin Angel Wells is taking.  Asked that his med list be faxed to our office to make appropriate adjustments and to decrease the amount of communications back and forth regarding singular medication issues/changes.  Fax number given, will be sent attn: Nursing.

## 2019-01-29 NOTE — Telephone Encounter (Signed)
Paperwork completed 01/29/2019  Sent as outlined in prior documentation

## 2019-01-29 NOTE — Addendum Note (Signed)
Addended by: Roxan Hockey on: 01/29/2019 11:07 AM     Modules accepted: Orders

## 2019-01-30 NOTE — Progress Notes (Signed)
I spoke with my contact at [employer] and was informed that Angel Wells is eligible for OPWDD services. There is a note  in his [facility]WDD  record stating that the family declined services back in September 2019 and decided to pursue services through a different waiver that was better equipped to meet his medical needs. That waiver program is [employer] and you can not have that waiver and [employer]. When I relayed this information to Dad he remembered declining. I informed Dad that they can reconnect with [employer] by contacting the front door. Dad states he will think about it and get back to me.

## 2019-01-30 NOTE — Progress Notes (Signed)
Thank you :)

## 2019-02-04 ENCOUNTER — Telehealth: Payer: Self-pay | Admitting: Pediatrics

## 2019-02-04 ENCOUNTER — Other Ambulatory Visit: Payer: Self-pay | Admitting: Pediatrics

## 2019-02-04 NOTE — Telephone Encounter (Signed)
Search Terms: Carole Deere, 10/22/1990   Search Date: 02/04/2019 09:08:07 AM   Searching on behalf of: [id number] - Forrestine Him Busick   The Drug Utilization Report below displays all of the controlled substance prescriptions, if any, that your patient has filled in the last twelve months. The information displayed on this report is compiled from pharmacy submissions to the Department, and accurately reflects the information as submitted by the pharmacies.  This report was requested by: Jennye Boroughs   Reference #: [id number]   Others' Prescriptions  Patient Name: Angel Wells   Birth Date: Mar 14, 1991   Address: [address] [place], [place] [address]   Sex: Male   Rx Written Rx Dispensed Drug Quantity Days Supply Prescriber Name Payment Method Dispenser   02/04/2018 02/06/2018 clonazepam 1 mg tablet  10 4 Lovina Reach R MD [facility] [place] [phone]

## 2019-02-04 NOTE — Telephone Encounter (Signed)
Incoming paperwork for completion to the [place]  Date Received: 02/04/2019    Type of paperwork: Other - Revision to plan of care    Completed paperwork to be sent to:  Fax to [facility]    Special Requests:     Placed in box for: Shanda Howells

## 2019-02-04 NOTE — Telephone Encounter (Signed)
Please call Angel Wells and set up appt for me to do video with Angel Wells and home nurse to discuss changes she is requesting.

## 2019-02-05 NOTE — Telephone Encounter (Signed)
Spoke to [place], Loss adjuster, chartered.  Requesting telemed appt with PCP to discuss med changes (as needed by Angel Wells) and paper scripts needed.  Angel Wells voiced that Angel Wells's dad likes to be aware of all changes made.  Dad will be in house tomorrow for appt.    Appt scheduled for 7/30 at 10am.  Emailed link to [place] to:  [email or web address]    Contacted [name] and gave verbal numbers and detailed instructions on how to attend zoom meeting remotely.  Will plan on downloading zoom now to trial it.  Will route to PCP.    [phone]

## 2019-02-06 ENCOUNTER — Encounter: Payer: Self-pay | Admitting: Pediatrics

## 2019-02-06 ENCOUNTER — Telehealth: Payer: Self-pay | Admitting: Pediatrics

## 2019-02-06 ENCOUNTER — Ambulatory Visit: Payer: Medicare Other | Attending: Pediatrics | Admitting: Pediatrics

## 2019-02-06 DIAGNOSIS — J45909 Unspecified asthma, uncomplicated: Secondary | ICD-10-CM | POA: Insufficient documentation

## 2019-02-06 DIAGNOSIS — Z794 Long term (current) use of insulin: Secondary | ICD-10-CM

## 2019-02-06 DIAGNOSIS — Z9911 Dependence on respirator [ventilator] status: Secondary | ICD-10-CM

## 2019-02-06 DIAGNOSIS — E1165 Type 2 diabetes mellitus with hyperglycemia: Secondary | ICD-10-CM | POA: Insufficient documentation

## 2019-02-06 DIAGNOSIS — Z930 Tracheostomy status: Secondary | ICD-10-CM | POA: Insufficient documentation

## 2019-02-06 DIAGNOSIS — J9600 Acute respiratory failure, unspecified whether with hypoxia or hypercapnia: Secondary | ICD-10-CM

## 2019-02-06 MED ORDER — ALBUTEROL SULFATE (2.5 MG/3ML) 0.083% IN NEBU *I*
2.5000 mg | INHALATION_SOLUTION | Freq: Two times a day (BID) | RESPIRATORY_TRACT | 3 refills | Status: DC
Start: 2019-02-06 — End: 2019-11-07

## 2019-02-06 MED ORDER — ALBUTEROL SULFATE (2.5 MG/3ML) 0.083% IN NEBU *I*
2.5000 mg | INHALATION_SOLUTION | Freq: Four times a day (QID) | RESPIRATORY_TRACT | 3 refills | Status: DC
Start: 2019-02-06 — End: 2019-02-06

## 2019-02-06 MED ORDER — BISACODYL 10 MG RE SUPP *I*
RECTAL | 1 refills | Status: DC
Start: 2019-02-06 — End: 2020-05-04

## 2019-02-06 MED ORDER — ALBUTEROL SULFATE (2.5 MG/3ML) 0.083% IN NEBU *I*
INHALATION_SOLUTION | RESPIRATORY_TRACT | 5 refills | Status: DC
Start: 2019-02-06 — End: 2019-06-12

## 2019-02-06 NOTE — Telephone Encounter (Signed)
Incoming paperwork for completion to the [place]  Date Received: 02/06/2019    Type of paperwork: Other - Certificate of medical necessity     Completed paperwork to be sent to:  Fax to [facility] [phone]    Special Requests:     Placed in box for: Shanda Howells

## 2019-02-06 NOTE — Patient Instructions (Addendum)
STOP Ipratropium    Decrease vesting to 2 times a day when he is well.  Increase to 4 times a day only when he is sick.    Use albuterol nebulizer twice daily with vesting and then can use every 6 hours PRN wheezing.    Supplies for 2x2, cough assist tubing and filter will be sent to [facility], let us know when you need sputum traps and we can get those out to you.

## 2019-02-06 NOTE — Progress Notes (Deleted)
[facility]  [address]  [place] [address]  Phone: [phone]  Fax: [phone]     REASON FOR VISIT      ***    PRIMARY DIAGNOSIS      ***     SUBJECTIVE      Angel Wells was accompanied by parent and caregiver who provided {DESC; MOST / UVO:53664} of subjective history       Chronic Respiratory Failure: Tolerating wrap vest well (sent back the aflo vest) using 6 times a day.His O2 sat was going down after albuterol and then ipratropium 15 min later insurance will not pay for duoneb so he gets albuterol/ipratropium every 6 hours via neb.    G tube: Uses gtube 14Fr 2.5cm for meds and fluids (changed within the last 2 months) 1800-2028m per day and his meds.  He does not take much PO liquid by mouth due to his risk for aspiration.          ROS  Review of Systems as per HPI above    Past Medical History, Social History, Family History, and Medications/allergies reviewed during this visit    Current Outpatient Medications   Medication    clonazePAM (KLONOPIN) 1 MG tablet    GUAIFENESIN PO    generic DME    ipratropium (ATROVENT) 0.02 % nebulizer solution    generic DME    zinc oxide (DESITIN) 40 % paste    lactobacillus rhamnosus, GG, (CULTURELLE) capsule    generic DME    generic DME    generic DME    generic DME    sodium chloride 0.9 % nebulizer solution    gauze sponge (CURITY) 4"X4" pads    generic DME    Adhesive Tape 1"x5yd TAPE    generic DME    generic DME    surgical lubricant (SURGILUBE) gel    Distilled Water LIQD    generic DME    generic DME    generic DME    generic DME    generic DME    generic DME    generic DME    generic DME    generic DME    generic DME    generic DME    generic DME    generic DME    generic DME    generic DME    diphenhydrAMINE (BENADRYL) 25 MG oral solid    Calcium Carb-Cholecalciferol (CALCIUM 600+D) 600-800 MG-UNIT TABS    famotidine (PEPCID) 20 MG tablet    FIBER SELECT GUMMIES PO    Multiple Vitamins-Minerals (HM  MULTIVITAMIN ADULT GUMMY PO)    Ascorbic Acid (VITAMIN C ADULT GUMMIES PO)    Cholecalciferol (VITAMIN D3) 50 MCG (2000 UT) capsule    cetirizine (ZYRTEC) 1 MG/ML syrup    Insulin Aspart (NOVOLOG FLEXPEN SC)    generic DME    generic DME    nebulizer device with mask and tubing    oxygen concentrator    Feeding Tubes - Sets (MIC-KEY GASTROSTOMY KIT 14FR) MISC    Lancet Devices (SIMPLE DIAGNOSTICS LANCING DEV) MISC    lancets (ACCU-CHEK SOFTCLIX)    albuterol (PROVENTIL) (2.5 mg/356m 0.083% nebulizer solution    generic DME    generic DME    oxygen    baclofen (LIORESAL) 10 MG tablet    Blood Glucose Monitoring Suppl (FIFTY50 GLUCOSE METER 2.0) w/Device KIT    budesonide (PULMICORT) 0.25 MG/2ML nebulizer solution    EPINEPHrine 0.15 mg/0.3 mL auto-injector    insulin  detemir (LEVEMIR FLEXPEN) 100 UNIT/ML injection pen    lactulose (LACTULOSE) 20 GM/30ML solution    levETIRAcetam (KEPPRA) 100 MG/ML solution    miconazole (MICATIN) 2 % powder    valproate (VALPROIC ACID) 250 MG/5ML syrup    triamcinolone (KENALOG) 0.025 % cream    insulin pen needle (FIFTY50 PEN NEEDLES) 31G X 8 MM     No current facility-administered medications for this visit.          OBJECTIVE      There were no vitals taken for this visit.    Physical Exam      ASSESSMENT / DIAGNOSIS     There are no diagnoses linked to this encounter.      There are no diagnoses linked to this encounter.    Orders Placed This Encounter   No orders placed during this encounter.     There are no Patient Instructions on file for this visit.       --Patient instructed to call if symptoms are not improving or worsening  --Follow-up arranged  No follow-ups on file.     Electronically signed by Doretha Imus, MD , 02/06/2019 @   [place], Phone: [phone]

## 2019-02-07 ENCOUNTER — Encounter: Payer: Self-pay | Admitting: Pediatrics

## 2019-02-07 ENCOUNTER — Encounter: Payer: Self-pay | Admitting: Gastroenterology

## 2019-02-07 MED ORDER — GENERIC DME *A*
5 refills | Status: DC
Start: 2019-02-07 — End: 2019-06-12

## 2019-02-07 MED ORDER — GAUZE PADS & DRESSINGS 2"X2" PADS *A*
MEDICATED_PAD | 5 refills | Status: DC
Start: 2019-02-07 — End: 2019-02-21

## 2019-02-07 NOTE — Telephone Encounter (Signed)
Paperwork completed 02/07/2019  Sent as outlined in prior documentation

## 2019-02-07 NOTE — Progress Notes (Signed)
[facility]    TELEMEDICINE VISIT  CHIEF COMPLAINT     Chief Complaint   Patient presents with    Follow-up     pneumonia    Cough       TELEMEDICINE CONSENT     Visit being conducted by Video in lieu of a face to face visit to minimize health care worker and patient exposure to COVID-19, and conserve PPE.    Location of Patient: home  Location of Telemedicine Provider: clinical office  Other Participants in telemedicine encounter and roles: Mallie Snooks RN supervisor  Butch Penny RRT, Hewitt Blade RD    Consent was obtained from the patient to complete this telemedicine visit; including the potential for financial liability.  How did the patient provide consent? Form Completed    SUBJECTIVE     Angel Wells was accompanied by parent and caregiver [name] and [name] RN supervisor who provided most of subjective history     Chronic Respiratory Failure: Tolerating wrap vest well (sent back the aflo vest) using 6 times a day.His O2 sat was going down after albuterol and then ipratropium 15 min later insurance will not pay for duoneb so he gets albuterol/ipratropium every 6 hours via neb.  He has recovered well since he got treated for pneumonia, budesonide was originally increased to TID and now is back down to BID.  He was initially started on ipratropium after a hospitalization and this was never removed, he has hx of exercise induced asthma but no other underlying issues of bronchiectasis or hx of COPD so I suspect ipratropium is no longer needed.     G tube: Uses gtube 14Fr 2.5cm for meds and fluids (changed within the last 2 months) 1800-2049m per day and his meds.  He does not take much PO liquid by mouth due to his risk for aspiration so he requires Gtube for fluid support and medications.  They are looking for new enteral supplier to get a replacement tube.      Patient is currently bed bound as family is waiting for ceiling hoyer lift to be installed into the home. I am ordering a gel overlay  mattress as he would benefit from this given his challenges in repositioning on his own and currently is unable to get into his wheelchair    Review of Systems   Constitutional: Negative.    Respiratory: Negative for cough and sputum production.      MEDICATIONS     Current Outpatient Medications   Medication Sig    gauze pads 2"X2" Use as directed around trach.    generic DME Cough assist tubing and filters.  Use as directed, replace once monthly.    albuterol (PROVENTIL) (2.5 mg/355m 0.083% nebulizer solution Take 3 mLs (2.5 mg total) by nebulization 2 times daily Give twice daily with vesting    albuterol (PROVENTIL) (2.5 mg/6m66m0.083% nebulizer solution NEBULIZE 1 UNIT DOSE EVERY 6 HOURS PRN FOR WHEEZING OR SHORTNESS OF BREATH    bisacodyl (DULCOLAX) 10 MG suppository Please give suppository as needed  if no BM in 3 days, please check with Dad prior to giving    clonazePAM (KLONOPIN) 1 MG tablet TAKE 1 CRUSHED TABLET VIA G-TUBE AS NEEDED FOR CLUSTER SEIZURES, MAX 3 TABS BACK TO BACK    GUAIFENESIN PO Take 200 mg by mouth every 4 hours as needed (For cough or congestion)     generic DME Non-sterile gloves size medium Use as directed. MCAID ID [id number]/month  zinc oxide (DESITIN) 40 % paste Apply topically as needed for Diaper Rash    lactobacillus rhamnosus, GG, (CULTURELLE) capsule Take 1 capsule (1 each total) by mouth daily    generic DME Trilogy vent, SIMV/PC 21, PS 13, VT 310, PEEP 5 VIA TRACH, NOCTURNAL AND PRN USE, BLEED IN 2 LPM O2 TO MAINTAIN SATS >90%, HEATED HUMIDIFIER, WEAN VENT AS TOLERATED TO KEEP SATS > 92%,MCAID ID [id number]    generic DME MCAID ID [id number], BIVONA CUFFED TRACH 7.0 Use as directed.    generic DME tRACH TIES WITH O2 PORT. Use as directed.MCAID ID [id number]    sodium chloride 0.9 % nebulizer solution Take 3 mLs by nebulization as needed for Wheezing MCAID ID [id number]    gauze sponge (CURITY) 4"X4" pads Use PRN as instructed.MCAID ID [id number]    generic DME  PASSEY MUIR VALVE (PURPLE) Use as tolerated.MCAID ID [id number]    Adhesive Tape 1"x5yd TAPE By 1 Units no specified route as needed MCAID ID [id number]    generic DME 10 ml syringes Use as directed.MCAID ID [id number]    generic DME Trach care kits Use as directed.MCAID ID [id number]    surgical lubricant (SURGILUBE) gel Apply topically as needed MCAID ID [id number]    Distilled Water LIQD Distilled water, use as directedMCAID ID [id number]    generic DME Mepilex 4X 4. Use as directed.MCAID ID [id number]    generic DME HME for trach humidification Use as directed.MCAID ID [id number]    generic DME Oxygen concentrator and portable oxygenUse as directed.MCAID ID [id number]. 2 lpm via ventilator nocturnally to maintain sats greater than 90%    generic DME Philips Cough Assist machine , settings Auto mode, 3-5 cycles 3-5 sets, +/- 20 to 40 cm h2o BID and PRN via trach tubeUse as directed.MCAID ID [id number]    generic DME Cough Assist circuits Use as directed.MCAID ID [id number]    generic DME Suction machine Use as directed.MCAID ID [id number]    generic DME Suction tubing Use as directed.MCAID ID [id number]    generic DME Suction canister Use as directed.MCAID ID [id number]    generic DME 12 F Kindel suction catheter kit Use as directed.MCAID ID [id number]    generic DME Suction filter Use as directed.MCAID ID [id number]    generic DME Nasal adapters Use as directed.MCAID ID [id number]    generic DME Pulse oximeter with continuous alarms Use as directed.MCAID ID [id number]    generic DME Pulse ox probes (disposable)Use as directed.MCAID ID [id number]    generic DME Oximeter alarms Low HR 40, High HR 200, High SaO2 off, low SaO2 83% Use as directed.MCAID ID [id number]  Hours of use nocturnal and PRN    diphenhydrAMINE (BENADRYL) 25 MG oral solid 25 mg by Per G Tube route nightly as needed for Sleep    famotidine (PEPCID) 20 MG tablet Take 20 mg by mouth 2 times daily    FIBER SELECT GUMMIES PO Take by mouth Give 2 gummies daily     Multiple Vitamins-Minerals (HM MULTIVITAMIN ADULT GUMMY PO) Take by mouth Give 2 gummies daily    Ascorbic Acid (VITAMIN C ADULT GUMMIES PO) Take by mouth Give 2 gummies once daily    Cholecalciferol (VITAMIN D3) 50 MCG (2000 UT) capsule 2,000 Units by Per G Tube route daily    cetirizine (ZYRTEC) 1 MG/ML syrup 10 mg by Per G Tube route at bedtime    Insulin Aspart (NOVOLOG FLEXPEN SC) Inject 4 Units into the skin 3 times daily (before meals) FOR  BG > 200. Alert MD for BG <70 and >250.     generic DME Ventilator supplies    generic DME Portable suction machine    nebulizer device with mask and tubing Use as directed    oxygen concentrator Use with oxygen canisters as instructed    Feeding Tubes - Sets (MIC-KEY GASTROSTOMY KIT 14FR) MISC Please dispense every 6 months and prn, MicKey 14Fr 2.5cm    Lancet Devices (SIMPLE DIAGNOSTICS LANCING DEV) MISC 1 each 3 times daily    lancets (ACCU-CHEK SOFTCLIX) Use   4  times per day as instructed for blood glucose testing and PRN.    generic DME Cough Assist Machine Use as directed.    oxygen 2 l/m bleed through ventilator at night    baclofen (LIORESAL) 10 MG tablet 10 mg by Per G Tube route 2 times daily Crush and mix with small amount of water, administer and follow with 30 ml water flush    Blood Glucose Monitoring Suppl (FIFTY50 GLUCOSE METER 2.0) w/Device KIT by Other route    budesonide (PULMICORT) 0.25 MG/2ML nebulizer solution Inhale 0.25 mg into the lungs 2 times daily     EPINEPHrine 0.15 mg/0.3 mL auto-injector Inject 0.15 mg into the muscle    insulin detemir (LEVEMIR FLEXPEN) 100 UNIT/ML injection pen Inject 70 Units into the skin at bedtime Give with bedtime snack    lactulose (LACTULOSE) 20 GM/30ML solution 10 g by Per G Tube route 3 times daily Hold dose for loose stools    levETIRAcetam (KEPPRA) 100 MG/ML solution GIVE 15 MLS BY PER G TUBE ROUTE 2 (TWO) TIMES DAILY.    miconazole (MICATIN) 2 % powder 2 times daily as needed      valproate (VALPROIC ACID) 250 MG/5ML syrup 250 mg by Per G Tube route 3 times daily     triamcinolone (KENALOG) 0.025 % cream Apply topically 2 times daily    insulin pen needle (FIFTY50 PEN NEEDLES) 31G X 8 MM 1 each     Medications reviewed at today's visit    [place]  This visit was performed during a pandemic event, and the physical exam was limited to my Video observation of this patient's organ systems and/or body areas.   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 Asthma  J45.909 493.90   3. Respirator dependence, status  Z99.11 V46.11   4. Acute respiratory failure  J96.00 518.81   5. Type 2 diabetes mellitus with hyperglycemia, with long-term current use of insulin  E11.65 250.00    Z79.4 790.29     V58.67       PLAN      1. Asthma  STOP ipratrpopium, continue albuterol BID scheduled with vesting and then can use q6hr prn.  Continue budesonide BID and when sick can increase to TID  - albuterol (PROVENTIL) (2.5 mg/25m) 0.083% nebulizer solution; Take 3 mLs (2.5 mg total) by nebulization 2 times daily Give twice daily with vesting  Dispense: 1080 mL; Refill: 3    2. Respirator dependence, status, trach dependence  Decrease vesting to BID and can increase to QID when sick.  Will send trach supplies and cough assist supplies to [facility]  - gauze pads 2"X2"; Use as directed around trach.  Dispense: 100 each; Refill: 5  - generic DME; Cough assist tubing and filters.  Use as directed, replace once monthly.  Dispense: 1 each; Refill: 5    3. Medication management  Reviewed his POC with his nurse and updated our meds to reflect accurate prescriptions.  Some of his meds have just been continued since hospitalization but suspect he does not need all of the respiratory meds if he only had hx exercise induced  asthma.    4.  Hx of TBI, bed bound currently  -will order gel overlay mattress to help prevent skin breakdown while he awaits his ceiling lift.  This is medically necessary to avoid pressure sores and skin breakdown as he is unable to reposition himself in bed independently.     5. Gtube dependent - for fluids and medications due to aspiration risk  RD will find vendor to get Gtube refill     Time:  Over 25 minutes were spent with the patient, >50% of the time was spent in counseling regarding the above problems.    Level 3: 67mn  Level 4: 251m  Level 5: 4035m   21+  minutes was spent on the phone with the patient, patient representatives, and/or other attendees.     Return in about 3 months (around 05/09/2019) for Dr LieShanda Howells    ARIDoretha ImusD

## 2019-02-10 ENCOUNTER — Other Ambulatory Visit: Payer: Self-pay | Admitting: Pediatrics

## 2019-02-10 ENCOUNTER — Telehealth: Payer: Self-pay | Admitting: Pediatrics

## 2019-02-10 DIAGNOSIS — R569 Unspecified convulsions: Secondary | ICD-10-CM

## 2019-02-10 NOTE — Telephone Encounter (Signed)
Writer faxed Rx to [facility].  Received confirmation.

## 2019-02-13 ENCOUNTER — Telehealth: Payer: Self-pay | Admitting: Pediatrics

## 2019-02-13 ENCOUNTER — Encounter: Payer: Self-pay | Admitting: Pediatrics

## 2019-02-13 NOTE — Telephone Encounter (Signed)
Error

## 2019-02-13 NOTE — Telephone Encounter (Signed)
Paperwork completed 02/13/2019  Sent as outlined in prior documentation

## 2019-02-13 NOTE — Telephone Encounter (Signed)
Incoming paperwork for completion to the [facility]  Date Received: 02/13/2019    Type of paperwork: Other - Orders med changes    Completed paperwork to be sent to:  Fax to Nurse core [phone]    Special Requests:     Placed in box for: Lie

## 2019-02-13 NOTE — Telephone Encounter (Signed)
Incoming paperwork for completion to the [facility]  Date Received: 02/13/2019    Type of paperwork: Other - Confirmation order gel mattress\    Completed paperwork to be sent to:  Fax to [facility]    Special Requests:     Placed in box for: Shanda Howells

## 2019-02-13 NOTE — Telephone Encounter (Signed)
Letter updated with new MD orders.  Sent to MyChart and placed in front desk request bin to be faxed to [facility].

## 2019-02-14 ENCOUNTER — Telehealth: Payer: Self-pay | Admitting: Pediatrics

## 2019-02-14 NOTE — Telephone Encounter (Signed)
Incoming paperwork for completion to the [facility]  Date Received: 02/14/2019    Type of paperwork: Other - Revision to plan of care    Completed paperwork to be sent to:  Fax to Nurse core [phone]    Special Requests:     Placed in box for: Lie

## 2019-02-17 ENCOUNTER — Ambulatory Visit: Payer: Medicare Other | Attending: Pulmonology | Admitting: Pulmonology

## 2019-02-17 ENCOUNTER — Encounter: Payer: Self-pay | Admitting: Gastroenterology

## 2019-02-17 VITALS — BP 105/62 | HR 77 | Temp 97.7°F | Resp 16

## 2019-02-17 DIAGNOSIS — J45909 Unspecified asthma, uncomplicated: Secondary | ICD-10-CM | POA: Insufficient documentation

## 2019-02-17 DIAGNOSIS — Z930 Tracheostomy status: Secondary | ICD-10-CM

## 2019-02-17 DIAGNOSIS — Z9911 Dependence on respirator [ventilator] status: Secondary | ICD-10-CM | POA: Insufficient documentation

## 2019-02-17 NOTE — Progress Notes (Addendum)
[facility] for chronic respiratory failure-nocturnal vent dependent.     He was last seen in our on 09/02/2018     He was recently treated for pneumonia and has recovered to baseline.  He is without respiratory complaints today. He is tolerating his new ventilator- no changes have been made to his settings. His home nurse believes he is "breathing better" since starting Trilogy.      ROS:  Denies headache, increasing daytime somnolence, visual disturbances.  Denies hallucinations or confusion.  Trach tube changes are routinely done by home nurse- done every two weeks  #7.0, BivonaT.T.Strach tube  No ventilator settings changes since last visit.  FiO22 L, SIMV/PC-21/PS-13,rate10vT 310PEEP 5- nocturnal and PRN use with 2 LPM O2 bleed  Ventilator vendor is [facility]  No recent weight changes or development/increase if lower extremity edema       PHYSICAL EXAM & DATA   Blood pressure 105/62, pulse 77, temperature 36.5 C (97.7 F), temperature source Temporal, resp. rate 16, SpO2 94 %. on RA     Gen: NAD,surgical mask in place   HEENT: Tracheostomy tube in place, no stridor,   Resp: No increased WOB, insp/exp rhonchi through out-no change from last visit  Card: S1S2 without murmurs, normal jugular venous pressure, and no pedal edema.    Abd: Soft, NT, ND, normoactive BS  MSK: No cyanosis, clubbing, or joint tenderness-does not move lower extremities, has limited movement of upper extremities.  Has significant scoliosis  Neuro: No focal motor or sensory defects.       Assessment/Plan:  Angel Wells a 28 y.o.malewith a past medical history of chronic respiratory failure and hypoxia, nocturnal vent dependence, asthma, diabetes, GERD, seizures, and a TBI. He has completely recovered from recent pneumonia.  I provided home nurse with sputum traps- if he develops increased sputum production in the future they will be able to send a sample into lab. He should continue current  vent setting and inhaler regiment.  He will return to our clinic on an as needed basis.     Recommendations:  -Continue wrap vest BID  -Continue nebulized budesonide   -Continue as need albuterol BID  -Agree with discontinuing duo-nebs for now  -Continue current vent settings  -RTC in 1 year for follow up in Vent clinic     Azalee Course, NP   11:36 AM  02/17/2019

## 2019-02-18 ENCOUNTER — Encounter: Payer: Self-pay | Admitting: Gastroenterology

## 2019-02-21 ENCOUNTER — Telehealth: Payer: Self-pay | Admitting: Pediatrics

## 2019-02-21 DIAGNOSIS — Z930 Tracheostomy status: Secondary | ICD-10-CM

## 2019-02-21 DIAGNOSIS — J9600 Acute respiratory failure, unspecified whether with hypoxia or hypercapnia: Secondary | ICD-10-CM

## 2019-02-21 DIAGNOSIS — Z9911 Dependence on respirator [ventilator] status: Secondary | ICD-10-CM

## 2019-02-21 DIAGNOSIS — J45909 Unspecified asthma, uncomplicated: Secondary | ICD-10-CM

## 2019-02-21 MED ORDER — GENERIC DME *A*
5 refills | Status: DC
Start: 2019-02-21 — End: 2022-06-23

## 2019-02-21 MED ORDER — GENERIC DME *A*
5 refills | Status: DC
Start: 2019-02-21 — End: 2019-06-12

## 2019-02-21 MED ORDER — NEBULIZER MISC
5 refills | Status: DC
Start: 2019-02-21 — End: 2021-04-15

## 2019-02-21 MED ORDER — GENERIC DME *A*
0 refills | Status: DC
Start: 2019-02-21 — End: 2019-06-12

## 2019-02-21 MED ORDER — GAUZE PADS & DRESSINGS 2"X2" PADS *A*
MEDICATED_PAD | 5 refills | Status: DC
Start: 2019-02-21 — End: 2019-10-21

## 2019-02-21 MED ORDER — GAUZE SPONGE 4"X4" PADS
MEDICATED_PAD | 5 refills | Status: DC
Start: 2019-02-21 — End: 2022-06-23

## 2019-02-21 MED ORDER — GENERIC DME *A*
0 refills | Status: DC
Start: 2019-02-21 — End: 2022-06-23

## 2019-02-21 NOTE — Telephone Encounter (Signed)
Angel Wells     Reason for call: Patient's PDN Angel Wells requested to speak with RT. Angel Wells provided no further details.    She is also requesting scripts be sent to [facility] for the following items:  30 suction catheter kit  30 Suction catheter with chimney valve  10 isothermal adult "antiflect" connector  30 4x4 split gauze  30 2x2 split gauze  4 nebulizers  4 adaptor with valve  4 adapted straight valve  Tubing and filter      She requested Rx be sent to [facility] at [facility]  Fax: [phone]      Name of caller: Angel Wells for Angel Wells  Relationship to patient: Patient's day nurse  Phone: [phone]

## 2019-02-21 NOTE — Addendum Note (Signed)
Addended by: Aowyn Rozeboom on: 02/21/2019 03:22 PM     Modules accepted: Orders

## 2019-02-21 NOTE — Telephone Encounter (Signed)
Scripts pended for Dr. Shanda Howells

## 2019-02-21 NOTE — Telephone Encounter (Signed)
Paperwork completed 02/21/2019  Sent as outlined in prior documentation

## 2019-02-21 NOTE — Addendum Note (Signed)
Addended by: Priscella Mann on: 02/21/2019 03:08 PM     Modules accepted: Orders

## 2019-02-24 ENCOUNTER — Telehealth: Payer: Self-pay | Admitting: Pediatrics

## 2019-02-24 NOTE — Telephone Encounter (Signed)
Incoming paperwork for completion to the [facility]  Date Received: 02/24/2019    Type of paperwork: Other - Clarify frequency    Completed paperwork to be sent to:  Fax to Nurse core [phone]    Special Requests:     Placed in box for: Lie

## 2019-02-27 NOTE — Telephone Encounter (Signed)
Paperwork completed 02/27/2019  Sent as outlined in prior documentation

## 2019-03-02 ENCOUNTER — Telehealth: Payer: Self-pay | Admitting: Primary Care

## 2019-03-02 ENCOUNTER — Other Ambulatory Visit
Admission: RE | Admit: 2019-03-02 | Discharge: 2019-03-02 | Disposition: A | Discharge status: Home / Self Care | Payer: Medicare Other | Source: Ambulatory Visit | Attending: Primary Care | Admitting: Primary Care

## 2019-03-02 DIAGNOSIS — J398 Other specified diseases of upper respiratory tract: Secondary | ICD-10-CM | POA: Insufficient documentation

## 2019-03-02 MED ORDER — AMOXICILLIN-POT CLAVULANATE 400-57 MG/5ML PO SUSR *I*
875.0000 mg | Freq: Two times a day (BID) | ORAL | 0 refills | Status: DC
Start: 2019-03-02 — End: 2019-03-11

## 2019-03-02 NOTE — Telephone Encounter (Signed)
[facility]    Date of call:  03/02/2019  Time of call: 2:07 PM    House RN Thaine Garriga (DOB 1990/08/24) called reporting secretions    HISTORY    Yesterday, Angel Wells was sleepy but only got one hour of sleep the night before apparently.    He had an increased suctioning requirement, with some mucous.    Had some ronchi.   Last night he was on the vent, with an SpO2 of 90-91% on the ventilator with ongoing suctioning.    Recently his vesting was decreased to BID.    Giving albuterol prn, which seemed to help.    He did have a period of time he was in the 80s or so, as low as 82%.   No ventilator alarms. Pressure are OK.    Secretions are tan-whiteish    ASSESSMENT/PLAN   Increased respiratory secretions, hypoxia   Increase airway clearance with vesting, cough assist QID   Albuterol at least QID, q4h prn.    Not on much more oxygen while hooked up to the vent, but is otherwise.    Clarified that the last rash with Augmentin was NOT a full body rash, rather a diaper rash from lose stools. Confirmed with RN and dad.    I asked that dad participate in the phone call and he was very frustrated, voiced not feeling listened to very early in the phone call. He did not like the plan of starting antibiotics right away, fearing adverse reaction to them. Thusly, given Angel Wells's close home observation with 24h nursing, we will increase airway clearance and, if he does not demonstrate improvement today, then I recommend starting augmentin (sent in).    Dad was also frustrated that I suggested early FU appt with [facility] on Tuesday. He wants to wait for his normal nurse to be present. Therefore will ask nursing to check in on Monday.    Patient advised to call back if symptoms worsen, change or progress.    Author: [name], MD  03/02/2019 at 2:07 PM

## 2019-03-03 LAB — GRAM STAIN

## 2019-03-03 NOTE — Telephone Encounter (Signed)
Called to check in with pts father  They did end up starting the antibiotic  Dad states pt "looks a lot better today" and seems to be more himself  Still having congestion  Dad thinks some of this is allergies  Also has some concern that one of the nurses doesn't take care of the pt the way she should be    Dad reporting that he wants to "give the antibiotic a few more days and then be done with it if pt is improved"  Writer attempting to provide some education surrounding the detriment of not finishing antibiotic courses  Dad verbalized understanding, but has some concern with how often he feels the pt is having to take antibiotic     Agreed that this should be discussed at a visit   Attempting to book on 9/1 at 1:30 for a video visit  Will route to practice manager/MD

## 2019-03-03 NOTE — Addendum Note (Signed)
Addended by: Lenor Derrick on: 03/03/2019 09:35 AM     Modules accepted: Orders

## 2019-03-03 NOTE — Telephone Encounter (Signed)
Writer spoke to patient's father, Annie Main. Writer offered patient a telemedicine appointment for 1:30 today and Annie Main has requested a different date as today does not seem to work. Annie Main states patient is a little congested but seems to be doing fine today. Annie Main has requested a call back.    Phone: [phone]

## 2019-03-03 NOTE — Telephone Encounter (Signed)
Called and left message for pts father  Requested a call back to the office   Office number provided   Jacklynn [name], [place]  03/03/2019  10:49 AM

## 2019-03-04 ENCOUNTER — Telehealth: Payer: Self-pay

## 2019-03-04 ENCOUNTER — Encounter: Payer: Self-pay | Admitting: Pediatrics

## 2019-03-04 ENCOUNTER — Telehealth: Payer: Self-pay | Admitting: Primary Care

## 2019-03-04 ENCOUNTER — Telehealth: Payer: Self-pay | Admitting: Pediatrics

## 2019-03-04 ENCOUNTER — Ambulatory Visit: Payer: Medicare Other | Attending: Internal Medicine | Admitting: Internal Medicine

## 2019-03-04 DIAGNOSIS — R21 Rash and other nonspecific skin eruption: Secondary | ICD-10-CM | POA: Insufficient documentation

## 2019-03-04 DIAGNOSIS — E1165 Type 2 diabetes mellitus with hyperglycemia: Secondary | ICD-10-CM | POA: Insufficient documentation

## 2019-03-04 DIAGNOSIS — J189 Pneumonia, unspecified organism: Secondary | ICD-10-CM | POA: Insufficient documentation

## 2019-03-04 DIAGNOSIS — J9611 Chronic respiratory failure with hypoxia: Secondary | ICD-10-CM | POA: Insufficient documentation

## 2019-03-04 DIAGNOSIS — Z9911 Dependence on respirator [ventilator] status: Secondary | ICD-10-CM | POA: Insufficient documentation

## 2019-03-04 DIAGNOSIS — G8250 Quadriplegia, unspecified: Secondary | ICD-10-CM | POA: Insufficient documentation

## 2019-03-04 DIAGNOSIS — Z794 Long term (current) use of insulin: Secondary | ICD-10-CM | POA: Insufficient documentation

## 2019-03-04 MED ORDER — LEVOFLOXACIN 500 MG PO TABS *I*
500.0000 mg | ORAL_TABLET | Freq: Every day | ORAL | 0 refills | Status: DC
Start: 2019-03-04 — End: 2019-03-07

## 2019-03-04 MED ORDER — MOXIFLOXACIN HCL 400 MG PO TABS *I*
400.0000 mg | ORAL_TABLET | Freq: Every day | ORAL | 0 refills | Status: DC
Start: 2019-03-04 — End: 2019-03-04

## 2019-03-04 NOTE — Telephone Encounter (Addendum)
Received a lunchtime call regarding [name].   RN has noticed a rash on the jaw line. They wonder if a CXR would be helpful to further determine whether antibiotics are indicated.     I shared it would be useful to see the rash and requested whether they had MyChart. At this time, the RN transferred me to dad to discuss this.    Dad quickly escalated and began shouting at me on the phone, accusing me of not believing that there was a rash and needing proof. I assured him I did believe him but it is standard practice to request to visualize rashes. He stated he did not want to do augmentin in the first place (over the weekend, we did review that FQs and clinda can increase risk of C diff which is why Augmentin was chosen). I asked dad to stop raising his voice, as I was unsure what I had done to make him so angry.     They will work on sending a picture of the rash.    I instructed that they may stop augmentin.  I OK'd a 25mg  dose of beandryl.    Thankfully, today it seems that Merrily Pew is doing quite well without oxygen need.     Will need telemedicine appt this afternoon to evaluate need for ongoing antibiotics, change in antibiotics, and need for CXR.    [name], MD

## 2019-03-04 NOTE — Telephone Encounter (Signed)
This patient attachment is clinically relevant.  Please keep in the patient's chart.    []  Document  [x]  Photo    Brief attachment description: face rash   (Ex. L forearm rash, WC papers)    Thank you,  Jacklynn [name], RN

## 2019-03-04 NOTE — Telephone Encounter (Signed)
RN called and states that they were unable to fill the moxifloxacin that had been sent in.   I spoke with [facility], the pharmacist informed me that his insurance would not pay for moxifloxacin, but would for levofloxacin.    Angel Wells has tolerated levaquin before. I shared that a preliminary culture reveals Serratia. Levofloxacin should provide coverage of this (and other VAP organisms).     I have sent this in.     [name], MD

## 2019-03-04 NOTE — Patient Instructions (Addendum)
Patient instructions:   Stop augmentin  Start moxifloxacin 400mg  once a day for 5 days  Continue all inhaled medications and vesting/cough assist treatments as recommended until Orren is at his baseline and then return back to his regular schedule.   Call with any concerns/questions

## 2019-03-04 NOTE — Progress Notes (Signed)
[place] COMPLAINT     Chief Complaint   Patient presents with    Rash       TELEMEDICINE CONSENT     Visit being conducted by Video in lieu of a face to face visit to minimize health care worker and patient exposure to COVID-19, and conserve PPE.    Location of Patient: home  Location of Telemedicine Provider: clinical office  Other Participants in telemedicine encounter and roles: father/ nurse- shelly    Consent was obtained from the patient to complete this telemedicine visit; including the potential for financial liability.  How did the patient provide consent? Form Completed    SUBJECTIVE     Face rash:  Gave benadryl around 12:30 and he is really sleepy- they had to put him back on his ventilator  He is currently On room air on the ventilator and doing well.   Rash developed on his cheek and then spread up to around his forehead and throuhg his beard and neck on the right side- Right face- little bit above his eyes  Denies itching, pain, doesn't seem to bother him. Denies any rash anywhere else on his body- not on his trunk/back. Denies swelling of hsi tongue, throat, face, eyes.   Has a history of penicillin allergy when he was a child and when he was retested a few years  ago and they stated that he was ok to use it and didn't have an allergy anymore. This happened to his father before and he is wondering if he is still having a problem.   Started augmentin 2 days ago- up until today he has been ok.      Pneumonia:  He was laced on augmentin for pneumonia and it has improved - secretions are improving- they were thick and dark in color, he was breathing heavier and requiring more oxygen supplementation   Albuterol, saline nebs, cough assist/vest treatments.   Mood and appetite has been good.   BMs good, urine is good  Acting at baseline- seems to be doing much better.   Nurse wants to know if we need a chest xray- to see if he is better and if he needs  antibiotics at all .   Dad would prefer to switch antiboitic regimens rather than give remaining augmentin with benadryl or a different antihistamine d/t sedation effects requiring him to go onto his ventilator       MEDICATIONS     Current Outpatient Medications   Medication Sig    moxifloxacin (AVELOX) 400 MG tablet Take 1 tablet (400 mg total) by mouth daily for 5 days    amoxicillin-clavulanate (AUGMENTIN) 400-57 MG/5ML suspension Take 10.9 mLs (872 mg total) by mouth 2 times daily for 10 days    generic DME 12 F Kindel suction catheter kit Use as directed.MCAID ID [id number]    Gauze Pads & Dressings (SPLIT GAUZE DRAINAGE SPONGE) 4"X4" Use once and PRN times a day as instructed.    gauze pads 2"X2" Use as directed around trach. MCAID ID [id number]    generic DME 10 isothermal adult "antiflect" connector. MCAID [id number] Use as directed.    generic DME 4 adaptor with valve  4 adapted straight valve  Tubing and filter Use as directed.  MCAID ID [id number]    Nebulizer MISC Disposable nebulizer kits to be used inline with trach  MCAID ID [id number]    generic DME Suction catheter with chimney valve.Use  as directed.MCAID ID [id number]    VALPROIC ACID 250 MG/5ML solution TAKE 5 MLS BY MOUTH 3 (THREE) TIMES DAILY    generic DME Cough assist tubing and filters.  Use as directed, replace once monthly.    albuterol (PROVENTIL) (2.5 mg/20m) 0.083% nebulizer solution Take 3 mLs (2.5 mg total) by nebulization 2 times daily Give twice daily with vesting    albuterol (PROVENTIL) (2.5 mg/319m 0.083% nebulizer solution NEBULIZE 1 UNIT DOSE EVERY 6 HOURS PRN FOR WHEEZING OR SHORTNESS OF BREATH    bisacodyl (DULCOLAX) 10 MG suppository Please give suppository as needed  if no BM in 3 days, please check with Dad prior to giving    clonazePAM (KLONOPIN) 1 MG tablet TAKE 1 CRUSHED TABLET VIA G-TUBE AS NEEDED FOR CLUSTER SEIZURES, MAX 3 TABS BACK TO BACK    GUAIFENESIN PO Take 200 mg by mouth every 4 hours as needed (For cough  or congestion)     generic DME Non-sterile gloves size medium Use as directed. MCAID ID [id number]/month    zinc oxide (DESITIN) 40 % paste Apply topically as needed for Diaper Rash    lactobacillus rhamnosus, GG, (CULTURELLE) capsule Take 1 capsule (1 each total) by mouth daily    generic DME Trilogy vent, SIMV/PC 21, PS 13, VT 310, PEEP 5 VIA TRACH, NOCTURNAL AND PRN USE, BLEED IN 2 LPM O2 TO MAINTAIN SATS >90%, HEATED HUMIDIFIER, WEAN VENT AS TOLERATED TO KEEP SATS > 92%,MCAID ID [id number]  generic DME MCAID ID [id number] CUFFED TRACH 7.0 Use as directed.    generic DME tRACH TIES WITH O2 PORT. Use as directed.MCAID ID [id number]  sodium chloride 0.9 % nebulizer solution Take 3 mLs by nebulization as needed for Wheezing MCAID ID [id number]  gauze sponge (CURITY) 4"X4" pads Use PRN as instructed.MCAID ID [id number]  generic DME PASSEY MUIR VALVE (PURPLE) Use as tolerated.MCAID ID [id number]  Adhesive Tape 1"x5yd TAPE By 1 Units no specified route as needed MCAID ID [id number]  generic DME 10 ml syringes Use as directed.MCAID ID [id number]  generic DME Trach care kits Use as directed.MCAID ID [id number]  surgical lubricant (SURGILUBE) gel Apply topically as needed MCAID ID [id number]  Distilled Water LIQD Distilled water, use as directedMCAID ID [id number]  generic DME Mepilex 4X 4. Use as directed.MCAID ID [id number]  generic DME HME for trach humidification Use as directed.MCAID ID [id number]  generic DME Oxygen concentrator and portable oxygenUse as directed.MCAID ID [id number] lpm via ventilator nocturnally to maintain sats greater than 90%    generic DME Philips Cough Assist machine , settings Auto mode, 3-5 cycles 3-5 sets, +/- 20 to 40 cm h2o BID and PRN via trach tubeUse as directed.MCAID ID [id number]  generic DME Cough Assist circuits Use as directed.MCAID ID [id number]  generic DME Suction machine Use as directed.MCAID ID [id number]  generic DME Suction tubing Use as  directed.MCAID ID [id number]  generic DME Suction canister Use as directed.MCAID ID [id number]  generic DME Suction filter Use as directed.MCAID ID [id number]  generic DME Nasal adapters Use as directed.MCAID ID [id number]  generic DME Pulse oximeter with continuous alarms Use as directed.MCAID ID [id number]  generic DME Pulse ox probes (disposable)Use as directed.MCAID ID [id number]  generic DME Oximeter alarms Low HR 40,  High HR 200, High SaO2 off, low SaO2 83% Use as directed.MCAID ID [id number]  Hours of use nocturnal and PRN    diphenhydrAMINE (BENADRYL) 25 MG oral solid 25 mg by Per G Tube route nightly as needed for Sleep    famotidine (PEPCID) 20 MG tablet Take 20 mg by mouth 2 times daily    FIBER SELECT GUMMIES PO Take by mouth Give 2 gummies daily    Multiple Vitamins-Minerals (HM MULTIVITAMIN ADULT GUMMY PO) Take by mouth Give 2 gummies daily    Ascorbic Acid (VITAMIN C ADULT GUMMIES PO) Take by mouth Give 2 gummies once daily    Cholecalciferol (VITAMIN D3) 50 MCG (2000 UT) capsule 2,000 Units by Per G Tube route daily    cetirizine (ZYRTEC) 1 MG/ML syrup 10 mg by Per G Tube route at bedtime    Insulin Aspart (NOVOLOG FLEXPEN SC) Inject 4 Units into the skin 3 times daily (before meals) FOR BG > 200. Alert MD for BG <70 and >250.     generic DME Ventilator supplies    generic DME Portable suction machine    nebulizer device with mask and tubing Use as directed    oxygen concentrator Use with oxygen canisters as instructed    Feeding Tubes - Sets (MIC-KEY GASTROSTOMY KIT 14FR) MISC Please dispense every 6 months and prn, MicKey 14Fr 2.5cm    Lancet Devices (SIMPLE DIAGNOSTICS LANCING DEV) MISC 1 each 3 times daily    lancets (ACCU-CHEK SOFTCLIX) Use   4  times per day as instructed for blood glucose testing and PRN.    generic DME Cough Assist Machine Use as directed.    oxygen 2 l/m bleed through ventilator at night    baclofen (LIORESAL) 10 MG tablet 10 mg by Per G Tube route 2  times daily Crush and mix with small amount of water, administer and follow with 30 ml water flush    Blood Glucose Monitoring Suppl (FIFTY50 GLUCOSE METER 2.0) w/Device KIT by Other route    budesonide (PULMICORT) 0.25 MG/2ML nebulizer solution Inhale 0.25 mg into the lungs 2 times daily     EPINEPHrine 0.15 mg/0.3 mL auto-injector Inject 0.15 mg into the muscle    insulin detemir (LEVEMIR FLEXPEN) 100 UNIT/ML injection pen Inject 65 Units into the skin at bedtime Give with bedtime snack    lactulose (LACTULOSE) 20 GM/30ML solution 10 g by Per G Tube route 3 times daily Hold dose for loose stools    levETIRAcetam (KEPPRA) 100 MG/ML solution GIVE 15 MLS BY PER G TUBE ROUTE 2 (TWO) TIMES DAILY.    miconazole (MICATIN) 2 % powder 2 times daily as needed     triamcinolone (KENALOG) 0.025 % cream Apply topically 2 times daily    insulin pen needle (FIFTY50 PEN NEEDLES) 31G X 8 MM 1 each     Medications reviewed at today's visit    [place]  This visit was performed during a pandemic event, and the physical exam was limited to my Video observation of this patient's organ systems and/or body areas.       Physical Exam   Constitutional: He is oriented to person, place, and time and well-developed, well-nourished, and in no distress. No distress.   Calm, laying in bed sleeping. Ventilator assisted via tracheostomy   HENT:   Head: Normocephalic and atraumatic.   Eyes: No scleral icterus.   Neck: Normal range of motion. Neck supple.   Pulmonary/Chest: Effort normal. No respiratory distress.  Tracheostomy in place- ventilator assisted    Musculoskeletal: Normal range of motion.   Neurological: He is alert and oriented to person, place, and time.   Skin: Skin is dry. Rash noted. Rash is macular (right face). He is not diaphoretic. No pallor.   Psychiatric: Mood and affect normal.         ASSESSMENT   Today we discussed the assessment and management of the following diagnosis:    ICD-10-CM ICD-9-CM    1. Facial rash  R21 782.1   2. Community acquired pneumonia, unspecified laterality  J18.9 486   3. Type 2 diabetes mellitus with hyperglycemia, with long-term current use of insulin  E11.65 250.00    Z79.4 790.29     V58.67   4. Spastic quadriplegia  G82.50 344.00   5. Chronic respiratory failure with hypoxia  J96.11 518.83     799.02   6. Ventilator dependence  Z99.11 V46.11       PLAN      1. Facial rash  Developed after initiation augmentin. Difficult to assess well over video but d/t close proximity to when he started augmentin and his severe allergic reactions to other antibiotics father favored switching his antibiotics instead of continuing augmentin with benadryl.   -Stop Augmentin, start moxifloxacin 419m daily for 5 days   -benadryl or hydrocortisone cream PRN     2. Community acquired pneumonia, unspecified laterality  Continue treatment with new antibiotic as discussed above   Continue all inhaled respiratory medications, continue resting and CoughAssist until patient is back at baseline then return to normal schedule  -Continue to use ventilator and oxygen supplementation as needed for hypoxia related to pneumonia  -Continue symptom management      3. Type 2 diabetes mellitus with hyperglycemia, with long-term current use of insulin  Underlying condition, all treatment decisions made with this in consideration       4. Spastic quadriplegia  Underlying condition, all treatment decisions made with this in consideration       5. Chronic respiratory failure with hypoxia  Underlying condition, all treatment decisions made with this in consideration       6. Ventilator dependence  Underlying condition, all treatment decisions made with this in consideration          Patient Instructions     Patient instructions:   Stop augmentin  Start moxifloxacin 4069monce a day for 5 days  Continue all inhaled medications and vesting/cough assist treatments as recommended until [employer] at his baseline and then  return back to his regular schedule.   Call with any concerns/questions          No follow-ups on file.      KrBarbaraann BoysNP

## 2019-03-04 NOTE — Telephone Encounter (Signed)
Message   Received: 2 weeks ago   Message Contents   Parmenter, Serafina Mitchell, NP  Pennelope Bracken              When you get a chance (NO HURRY AT ALL) can you call these guys and let them know that he only needs to return with problems and cancel the follow up for next visit- [facility] is doing a great job with him and we can just be available if they need [place].      Relayed message to Angel Wells, father.  He agrees that [facility] is doing a great job.  He will call should any needs arise.

## 2019-03-04 NOTE — Telephone Encounter (Signed)
Writer spoke to patients father, Annie Main. Annie Main agreed to schedule the patient for follow up at 2:30 PM with Jake Michaelis NP via video visit. Annie Main reports he's had about 2 hours of sleep and will not be able to partake in the patients appointment but the patients PDN will be available and ready to answer questions.

## 2019-03-04 NOTE — Telephone Encounter (Signed)
Incoming paperwork for completion to the [facility]  Date Received: 03/04/2019    Type of paperwork: Other - [facility]    Completed paperwork to be sent to:  Fax to [facility] [phone]    Special Requests:     Placed in box for: Lie

## 2019-03-04 NOTE — Addendum Note (Signed)
Addended by: Magdalene Patricia on: 03/04/2019 04:58 PM     Modules accepted: Orders

## 2019-03-05 ENCOUNTER — Encounter: Payer: Self-pay | Admitting: Pediatrics

## 2019-03-05 ENCOUNTER — Encounter: Payer: Self-pay | Admitting: Gastroenterology

## 2019-03-05 ENCOUNTER — Telehealth: Payer: Self-pay | Admitting: Primary Care

## 2019-03-05 NOTE — Telephone Encounter (Addendum)
Nursing core RN called today with dad.   They notice a rash "a little different, but over his face and on his neck."  No hives.   He has been acting "great." Oxygenating well. Secretions are normal. He is in a really good and happy mood, though his face is blotchy.   RN notes that they do not notice a significant rash.  Only noticed the rash about an hour ago. Just on his face, nothing on his chest and legs.   His last dose of levofloxacin was last night, just about 24 hours ago.   No new detergents, clothing.     Dad is hesitant about giving him another antibiotic. Is concerned about the risk of ongoing rash. He is somewhat troubled as levaquin has always been a good option.     Clinically he is stable and it sounds as though he is near baseline with a brisk recovery. It is reasonable to hold levaquin for the next 8-12 hours or so to gain more information on the trajectory of the rash. Further dad is very concerned about giving another dose while the rash is ongoing. I asked dad to take pictures of the rash so we can gain further information. I also approved another dose of benadryl, but asked that they hold for a couple of hours to see if the rash self-resolves.     We discussed that this is likely not an allergy and that even the initial rash with augmentin may have not been due to an antibiotic allergy, but it is indeed very tough to tell. Understandably dad is nervous and would like to reserve antibiotics as much as possible.    Dad also shared that [name] used to always get pretreatment for ciprofloxacin with benadryl, though didn't require this for levofloxacin.     Will ask nursing to touch base in the AM to check on sx and rash. Suspect he can restart levaquin vs holding antibiotics with close clinical observation and ongoing aggressive airway clearance.     [name], MD

## 2019-03-06 NOTE — Telephone Encounter (Signed)
This patient attachment is clinically relevant.  Please keep in the patient's chart.    [] Document  [x] Photo    Brief attachment description: face rash   (Ex. L forearm rash, WC papers)    Thank you,  Jendaya Gossett Anne Samit Sylve, RN

## 2019-03-06 NOTE — Telephone Encounter (Signed)
Spoke with pts father, Annie Main   Reports pt Is looking great today  Still a "tiny bit" of redness, but mostly resolved  Pt has been saturating 97-98 on RA  Per dad, he doesn't actually think the pt was truly sick  He thinks that this was allergies  Has stopped giving antibiotic and has requested a d/c order  Will route to MD to advise  D/c order can be faxed to [phone] attn tanya

## 2019-03-07 ENCOUNTER — Telehealth: Payer: Self-pay | Admitting: Pediatrics

## 2019-03-07 NOTE — Telephone Encounter (Signed)
Order discontinued and printed for faxing

## 2019-03-07 NOTE — Telephone Encounter (Signed)
Order faxed to [facility] at [facility]  [phone]

## 2019-03-07 NOTE — Telephone Encounter (Signed)
[facility]     Reason for call: Patient's primary day nurse Tonia requested to speak with Butch Penny RRT regarding a doctor's order.    She provided her phone number for a return call.       Name of caller: Lauro Regulus for [facility]  Relationship to patient: Primary Day Nurse  Phone:  [phone]

## 2019-03-07 NOTE — Addendum Note (Signed)
Addended by: Lenor Derrick on: 03/07/2019 08:22 AM     Modules accepted: Orders

## 2019-03-07 NOTE — Telephone Encounter (Signed)
Spoke to Angel Wells. Concerns about the ABX orders from earlier this week. Let her know the D/C was sent. Concerns because sputum a bit thicker and sats more labile than usual. I encouraged an extra airway clearance/neb routine through the weekend and they already have a video visit on Tuesday with Dr. Lyndee Hensen.

## 2019-03-10 LAB — AEROBIC CULTURE

## 2019-03-11 ENCOUNTER — Ambulatory Visit: Payer: Medicare Other | Attending: Pediatrics | Admitting: Pediatrics

## 2019-03-11 ENCOUNTER — Encounter: Payer: Self-pay | Admitting: Pediatrics

## 2019-03-11 DIAGNOSIS — J9611 Chronic respiratory failure with hypoxia: Secondary | ICD-10-CM | POA: Insufficient documentation

## 2019-03-11 DIAGNOSIS — E1165 Type 2 diabetes mellitus with hyperglycemia: Secondary | ICD-10-CM | POA: Insufficient documentation

## 2019-03-11 DIAGNOSIS — Z9911 Dependence on respirator [ventilator] status: Secondary | ICD-10-CM | POA: Insufficient documentation

## 2019-03-11 DIAGNOSIS — G8250 Quadriplegia, unspecified: Secondary | ICD-10-CM | POA: Insufficient documentation

## 2019-03-11 DIAGNOSIS — S069X9A Unspecified intracranial injury with loss of consciousness of unspecified duration, initial encounter: Secondary | ICD-10-CM | POA: Insufficient documentation

## 2019-03-11 DIAGNOSIS — G40909 Epilepsy, unspecified, not intractable, without status epilepticus: Secondary | ICD-10-CM | POA: Insufficient documentation

## 2019-03-11 DIAGNOSIS — R21 Rash and other nonspecific skin eruption: Secondary | ICD-10-CM | POA: Insufficient documentation

## 2019-03-11 DIAGNOSIS — Z794 Long term (current) use of insulin: Secondary | ICD-10-CM

## 2019-03-11 DIAGNOSIS — S069XAA Unspecified intracranial injury with loss of consciousness status unknown, initial encounter: Secondary | ICD-10-CM

## 2019-03-11 LAB — LEGIONELLA CULTURE: Legionella Culture: 0

## 2019-03-11 MED ORDER — INFLUENZA VAC SPLIT QUAD (FLULAVAL) 0.5 ML IM SUSY PF(>=6 MONTHS) *I*
0.5000 mL | PREFILLED_SYRINGE | Freq: Once | INTRAMUSCULAR | 0 refills | Status: AC
Start: 2019-03-11 — End: 2019-03-11

## 2019-03-11 NOTE — Progress Notes (Signed)
[place]    TELEMEDICINE VISIT  CHIEF COMPLAINT     Chief Complaint   Patient presents with    Follow-up     rash       TELEMEDICINE CONSENT     Visit being conducted by Video in lieu of a face to face visit to minimize health care worker and patient exposure to COVID-19, and conserve PPE.    Location of Patient: home  Location of Telemedicine Provider: clinical office  Other Participants in telemedicine encounter and roles: Dad Annie Main    Consent was obtained from the patient to complete this telemedicine visit; including the potential for financial liability.  How did the patient provide consent? Form Completed    SUBJECTIVE     S/p course of antibiotics due to concerns for respiratory issues, dad thinks it was more of allergies and he had a new night nurse who didn't do respiratory clearance, previously uses ciprofloxacin but pretreats with benadryl    Rash - resolved, unclear if this was from antibiotic (he had previously been tested for pcn and was cleared but maybe this was an allergy to the augmentin)    Gtube - uses this for fluids and medications as he is an aspiration risk.  Unable to find a vendor, last change was in November so he is due for a change.     Currently ceiling lift not in place (awaiting bids)    MEDICATIONS     Current Outpatient Medications   Medication Sig    VALPROIC ACID 250 MG/5ML solution TAKE 5 MLS BY MOUTH 3 (THREE) TIMES DAILY    lactobacillus rhamnosus, GG, (CULTURELLE) capsule Take 1 capsule (1 each total) by mouth daily    sodium chloride 0.9 % nebulizer solution Take 3 mLs by nebulization as needed for Wheezing MCAID ID [id number]    famotidine (PEPCID) 20 MG tablet Take 20 mg by mouth 2 times daily    FIBER SELECT GUMMIES PO Take by mouth Give 2 gummies daily    Multiple Vitamins-Minerals (HM MULTIVITAMIN ADULT GUMMY PO) Take by mouth Give 2 gummies daily    Ascorbic Acid (VITAMIN C ADULT GUMMIES PO) Take by mouth Give 2 gummies once daily     Cholecalciferol (VITAMIN D3) 50 MCG (2000 UT) capsule 2,000 Units by Per G Tube route daily    cetirizine (ZYRTEC) 1 MG/ML syrup 10 mg by Per G Tube route at bedtime    Insulin Aspart (NOVOLOG FLEXPEN SC) Inject 4 Units into the skin 3 times daily (before meals) FOR BG > 200. Alert MD for BG <70 and >250.     baclofen (LIORESAL) 10 MG tablet 10 mg by Per G Tube route 2 times daily Crush and mix with small amount of water, administer and follow with 30 ml water flush    budesonide (PULMICORT) 0.25 MG/2ML nebulizer solution Inhale 0.25 mg into the lungs 2 times daily     insulin detemir (LEVEMIR FLEXPEN) 100 UNIT/ML injection pen Inject 65 Units into the skin at bedtime Give with bedtime snack    lactulose (LACTULOSE) 20 GM/30ML solution 10 g by Per G Tube route 3 times daily Hold dose for loose stools    levETIRAcetam (KEPPRA) 100 MG/ML solution GIVE 15 MLS BY PER G TUBE ROUTE 2 (TWO) TIMES DAILY.    Influenza vaccine (FLULAVAL) syringe 0.42m - Quad, PF, for patients aged 6 months and older Inject 0.5 mLs into the muscle once for 1 dose    generic  DME 12 F Kindel suction catheter kit Use as directed.MCAID ID [id number]    Gauze Pads & Dressings (SPLIT GAUZE DRAINAGE SPONGE) 4"X4" Use once and PRN times a day as instructed.    gauze pads 2"X2" Use as directed around trach. MCAID ID [id number]    generic DME 10 isothermal adult "antiflect" connector. MCAID [id number] Use as directed.    generic DME 4 adaptor with valve  4 adapted straight valve  Tubing and filter Use as directed.  MCAID ID [id number]    Nebulizer MISC Disposable nebulizer kits to be used inline with trach  MCAID ID [id number]    generic DME Suction catheter with chimney valve.Use as directed.MCAID ID [id number]    generic DME Cough assist tubing and filters.  Use as directed, replace once monthly.    albuterol (PROVENTIL) (2.5 mg/82m) 0.083% nebulizer solution Take 3 mLs (2.5 mg total) by nebulization 2 times daily Give twice daily with vesting     albuterol (PROVENTIL) (2.5 mg/347m 0.083% nebulizer solution NEBULIZE 1 UNIT DOSE EVERY 6 HOURS PRN FOR WHEEZING OR SHORTNESS OF BREATH    bisacodyl (DULCOLAX) 10 MG suppository Please give suppository as needed  if no BM in 3 days, please check with Dad prior to giving    clonazePAM (KLONOPIN) 1 MG tablet TAKE 1 CRUSHED TABLET VIA G-TUBE AS NEEDED FOR CLUSTER SEIZURES, MAX 3 TABS BACK TO BACK    GUAIFENESIN PO Take 200 mg by mouth every 4 hours as needed (For cough or congestion)     generic DME Non-sterile gloves size medium Use as directed. MCAID ID [id number]/month    zinc oxide (DESITIN) 40 % paste Apply topically as needed for Diaper Rash    generic DME Trilogy vent, SIMV/PC 21, PS 13, VT 310, PEEP 5 VIA TRACH, NOCTURNAL AND PRN USE, BLEED IN 2 LPM O2 TO MAINTAIN SATS >90%, HEATED HUMIDIFIER, WEAN VENT AS TOLERATED TO KEEP SATS > 92%,MCAID ID [id number]  generic DME MCAID ID [id number] CUFFED TRACH 7.0 Use as directed.    generic DME tRACH TIES WITH O2 PORT. Use as directed.MCAID ID [id number]  gauze sponge (CURITY) 4"X4" pads Use PRN as instructed.MCAID ID [id number]  generic DME PASSEY MUIR VALVE (PURPLE) Use as tolerated.MCAID ID [id number]  Adhesive Tape 1"x5yd TAPE By 1 Units no specified route as needed MCAID ID [id number]  generic DME 10 ml syringes Use as directed.MCAID ID [id number]  generic DME Trach care kits Use as directed.MCAID ID [id number]  surgical lubricant (SURGILUBE) gel Apply topically as needed MCAID ID [id number]  Distilled Water LIQD Distilled water, use as directedMCAID ID [id number]  generic DME Mepilex 4X 4. Use as directed.MCAID ID [id number]  generic DME HME for trach humidification Use as directed.MCAID ID [id number]  generic DME Oxygen concentrator and portable oxygenUse as directed.MCAID ID [id number] lpm via ventilator nocturnally to maintain sats greater than 90%    generic DME Philips Cough Assist machine , settings Auto mode, 3-5 cycles 3-5  sets, +/- 20 to 40 cm h2o BID and PRN via trach tubeUse as directed.MCAID ID [id number]  generic DME Cough Assist circuits Use as directed.MCAID ID [id number]  generic DME Suction machine Use as directed.MCAID ID [id number]  generic DME Suction tubing Use as directed.MCAID ID [id number]  generic DME Suction canister Use as directed.MCAID ID [id number]  generic DME Suction filter Use as directed.MCAID ID [id number]    generic DME Nasal adapters Use as directed.MCAID ID [id number]    generic DME Pulse oximeter with continuous alarms Use as directed.MCAID ID [id number]    generic DME Pulse ox probes (disposable)Use as directed.MCAID ID [id number]    generic DME Oximeter alarms Low HR 40, High HR 200, High SaO2 off, low SaO2 83% Use as directed.MCAID ID [id number]  Hours of use nocturnal and PRN    diphenhydrAMINE (BENADRYL) 25 MG oral solid 25 mg by Per G Tube route nightly as needed for Sleep    generic DME Ventilator supplies    generic DME Portable suction machine    nebulizer device with mask and tubing Use as directed    oxygen concentrator Use with oxygen canisters as instructed    Feeding Tubes - Sets (MIC-KEY GASTROSTOMY KIT 14FR) MISC Please dispense every 6 months and prn, MicKey 14Fr 2.5cm    Lancet Devices (SIMPLE DIAGNOSTICS LANCING DEV) MISC 1 each 3 times daily    lancets (ACCU-CHEK SOFTCLIX) Use   4  times per day as instructed for blood glucose testing and PRN.    generic DME Cough Assist Machine Use as directed.    oxygen 2 l/m bleed through ventilator at night    Blood Glucose Monitoring Suppl (FIFTY50 GLUCOSE METER 2.0) w/Device KIT by Other route    EPINEPHrine 0.15 mg/0.3 mL auto-injector Inject 0.15 mg into the muscle    miconazole (MICATIN) 2 % powder 2 times daily as needed     triamcinolone (KENALOG) 0.025 % cream Apply topically 2 times daily    insulin pen needle (FIFTY50 PEN NEEDLES) 31G X 8 MM 1 each     Medications reviewed at today's visit    [place]  This  visit was performed during a pandemic event, and the physical exam was limited to my Video observation of this patient's organ systems and/or body areas.   GEN:  Alert, no distress, answers with 1-2 word phrases, well appearing  No respiratory distress  No rash observed on his face    Physical Exam      ASSESSMENT   Today we discussed the assessment and management of the following diagnosis:    ICD-10-CM ICD-9-CM   1. Rash and other nonspecific skin eruption  R21 782.1   2. TBI (traumatic brain injury)  S06.9X9A 854.00   3. Type 2 diabetes mellitus with hyperglycemia, with long-term current use of insulin  E11.65 250.00    Z79.4 790.29     V58.67   4. Chronic respiratory failure with hypoxia  J96.11 518.83     799.02   5. Spastic quadriplegia  G82.50 344.00   6. Seizure disorder  G40.909 345.90   7. Ventilator dependence  Z99.11 V46.11       PLAN      1.  Rash and other nonspecific skin eruption  Resolved after stopping antibiotics    2. Chronic respiratory failure, vent dependent at night, trach in place   Doing better since last time, had short course of antibiotic but stopped early due to concern for drug reaction and rash. Dad feels as though he is back to his baseline, and thinks respiratory issues were more allergies.  Continue same chest PT BID and he understands to increase this if sputum increases or he seems short of breath    3. Risk for aspiration - gtube dependent for liquids and medications, eats by mouth, is in  need of gtube change but we are struggling to find a supplier since he is not 100% tube fed.  Will check with [facility] but [facility] and [facility] will not cover due to having medicare.    4. DM2 - stable on current regimen, Hba1c due in October, at goal currently    5. Currently bed bound - awaiting ceiling lift, Dad not sure status of this but working with Care coordinator through [facility]         Time:  Over 20 minutes were spent with the patient, >50% of the time was spent in counseling  regarding the above problems.    Level 3: 5mn  Level 4: 288m  Level 5: 40107m   21+  minutes was spent on the phone with the patient, patient representatives, and/or other attendees.     Return in about 6 weeks (around 04/22/2019) for Dr LieShanda Howells    ARIDoretha ImusD

## 2019-03-11 NOTE — Telephone Encounter (Signed)
Paperwork completed 03/11/2019  Sent as outlined in prior documentation

## 2019-03-11 NOTE — Patient Instructions (Signed)
[facility]  We will continue to look into [place] supplier for [place] and get back to you through [place] or phone.  Follow up in October as scheduled.

## 2019-03-14 ENCOUNTER — Telehealth: Payer: Self-pay | Admitting: Primary Care

## 2019-03-14 DIAGNOSIS — S069XAA Unspecified intracranial injury with loss of consciousness status unknown, initial encounter: Secondary | ICD-10-CM

## 2019-03-14 DIAGNOSIS — S069X9A Unspecified intracranial injury with loss of consciousness of unspecified duration, initial encounter: Secondary | ICD-10-CM

## 2019-03-14 DIAGNOSIS — Z9189 Other specified personal risk factors, not elsewhere classified: Secondary | ICD-10-CM

## 2019-03-14 DIAGNOSIS — J9611 Chronic respiratory failure with hypoxia: Secondary | ICD-10-CM

## 2019-03-14 NOTE — Telephone Encounter (Signed)
Charleen Kirks primary day nurse - trying to identify if the dietician has identified a vendor to deliver G tube   [facility] does his respiratory supplies  Previously received from [facility]   No enteral vendor but receiving fluids only through his Gtube    Please call Tanya back to let her know where he will be receiving delivery of his G tube from

## 2019-03-18 NOTE — Telephone Encounter (Signed)
Patient's day nurse requested a return call as soon as possible. Caller stated patient needs the [facility] button ordered as soon as possible as his is falling apart.     Tanya requested to speak with Dr. Shanda Howells if RD cannot assist. She stated patient has fluid leaking in this area.     Phone: [phone]

## 2019-03-21 MED ORDER — MIC-KEY GASTROSTOMY KIT 14FR MISC *A*
3 refills | Status: DC
Start: 2019-03-21 — End: 2019-10-21

## 2019-03-21 NOTE — Telephone Encounter (Addendum)
Spoke to [employer], Agricultural consultant.  Reported that [name]'s mickey button is "distintegrating."  Currently using 86fr, 2.5cm.  Reports that the 39fr is loose baseline for awhile now.  28fr, 2.5cm available at office.    Per PCP, ok with sizing up.  Placed 16 fr at front desk to pick up by [name].  Plan to replace mickey button today.  [name] will call if not able to place or has any further issues.  Will route to PCP and RD for ordering correct size.    [name] office : [phone]  Cell: [phone]

## 2019-03-21 NOTE — Telephone Encounter (Signed)
I will order 14Fr and will fax to [facility] for now to see if they can cover this.

## 2019-03-21 NOTE — Telephone Encounter (Signed)
Writer spoke to [employer] on behalf of patient. She is requesting to speak with RD Britiney. [name] states they are in the process of finding patient a permanent supplier for patient and has information to provide. Writer informed [name] we faxed Rx to [facility] and she relayed understanding. [name] would like a call back from RD.    Phone: [phone]

## 2019-03-21 NOTE — Addendum Note (Signed)
Addended by: Doretha Imus on: 03/21/2019 11:25 AM     Modules accepted: Orders

## 2019-03-21 NOTE — Telephone Encounter (Signed)
Writer faxed Rx to [facility].  Received confirmation.

## 2019-03-26 ENCOUNTER — Telehealth: Payer: Self-pay | Admitting: Pediatrics

## 2019-03-26 NOTE — Telephone Encounter (Signed)
[place]  Date Received: 03/26/2019    Type of paperwork: Other - Revision to Plan of Care from 9/3 to 11/1    Completed paperwork to be sent to:  Fax to [facility] at [facility] [phone]    Special Requests:     Placed in box for: Shanda Howells

## 2019-03-27 ENCOUNTER — Encounter: Payer: Self-pay | Admitting: Gastroenterology

## 2019-03-27 ENCOUNTER — Encounter: Payer: Self-pay | Admitting: Pediatrics

## 2019-03-27 NOTE — Telephone Encounter (Signed)
Error

## 2019-03-28 NOTE — Telephone Encounter (Signed)
Paperwork completed 03/28/2019  Sent as outlined in prior documentation  Faxed to [facility] at [facility] [phone]

## 2019-03-31 ENCOUNTER — Telehealth: Payer: Self-pay | Admitting: Pediatrics

## 2019-03-31 DIAGNOSIS — Z9911 Dependence on respirator [ventilator] status: Secondary | ICD-10-CM

## 2019-03-31 NOTE — Telephone Encounter (Signed)
I would really rather she was directed into the correct way to request things. She uses me as a direct line to get what she wants. Can the [facility] staff call her and take the message for [place]? If you are all busy I can call her but I really don't like the way she calls to speak to me to get things that aren't for me to take care of.  I have (gently) let her know this a couple times but she continues to ask for me.

## 2019-03-31 NOTE — Telephone Encounter (Signed)
[place]     Reason for call: Patient's PDN [name] reported they received a message about the patient receiving his flu shot at home.   Caller requested an order for the flu [facility] [place] so that she can administer the patient's vaccine at the patient's home.    She provided her phone number for a return call.    Name of caller: [name] for [facility] [employer]  Relationship to patient: PDN  Phone: [phone]

## 2019-03-31 NOTE — Telephone Encounter (Signed)
[place]     Reason for call: [name] is requesting labs to be ordered for patient. She is requesting CO2 labs and has requested a call back from RT [place].     Name of caller:[name] for [facility]  Relationship to patient:Rn  Phone:  [phone]

## 2019-04-02 ENCOUNTER — Encounter: Payer: Self-pay | Admitting: Gastroenterology

## 2019-04-02 MED ORDER — INFLUENZA VAC SPLIT QUAD (FLULAVAL) 0.5 ML IM SUSY PF(>=6 MONTHS) *I*
0.5000 mL | PREFILLED_SYRINGE | Freq: Once | INTRAMUSCULAR | 0 refills | Status: AC
Start: 2019-04-02 — End: 2019-04-02

## 2019-04-02 NOTE — Telephone Encounter (Signed)
Please call patient and let Angel Wells know we sent [facility] [place]

## 2019-04-02 NOTE — Addendum Note (Signed)
Addended by: Doretha Imus on: 04/02/2019 04:49 PM     Modules accepted: Orders

## 2019-04-03 MED ORDER — NON-SYSTEM MEDICATION *A*
0 refills | Status: DC
Start: 2019-04-03 — End: 2019-06-12

## 2019-04-03 NOTE — Telephone Encounter (Signed)
Spoke to [place], PDN.  Relayed that [facility]  Acknowledged understanding.  Asked to return call or MyChart when [place] receives shot.  Agreeable.  No further issues.

## 2019-04-03 NOTE — Addendum Note (Signed)
Addended by: Doretha Imus on: 04/03/2019 11:41 AM     Modules accepted: Orders

## 2019-04-03 NOTE — Addendum Note (Signed)
Addended by: Virl Axe on: 04/03/2019 10:46 AM     Modules accepted: Orders

## 2019-04-03 NOTE — Telephone Encounter (Signed)
Spoke to [place], PDN.  Reported that pt will need script printed and signed with instructions for [facility] to come out and check CO2.    Written as: Please measure End tidal CO2.  Fax #: [phone]  Attn:  Merrily Pew

## 2019-04-03 NOTE — Telephone Encounter (Signed)
[place]     Reason for call: [name] , [name]'s day nurse called regarding problem with flu shot at the pharmacy. Pharmacy stated they are not giving out flu shots for them to take home and would like a call back.               Name of caller:Alver Sorrow Thurner  Relationship to patient: day nurse  Organization (if applicable):   Phone:  [phone]

## 2019-04-04 ENCOUNTER — Telehealth: Payer: Self-pay | Admitting: Pediatrics

## 2019-04-04 DIAGNOSIS — G40909 Epilepsy, unspecified, not intractable, without status epilepticus: Secondary | ICD-10-CM

## 2019-04-04 NOTE — Telephone Encounter (Signed)
[place]     Reason for call: [name] reports the patient had three seizures this afternoon. [name] states the patient may have been "a little overheated". Tanya requested to speak to a nurse today regarding the patients seizure protocol.     Name of caller: [name] for Angel Wells  Relationship to patient: PDN  Phone: [phone]

## 2019-04-04 NOTE — Telephone Encounter (Signed)
Spoke to [place], PDN.  Reported that Angel Wells had 3 seizures within about 2 hours.  Each lasting 5 seconds.  First two, no post-ictal period.  Third, pt recovered from but is still sleepy from.  Typically Angel Wells may have 1 seizure (normally on day 3 of having no BM).  Angel Wells had BM yesterday.  Angel Wells reported pt also had a temp of 100.38F but that his vent was on a warm setting and he was just hot.  No temps later in the day.  Also that he didn't sleep at all over night.  Does not appear to be sick at all.    Angel Wells reported that his current seizure plan to give klonopin if he has 3 seizures within 1 hour.  Wondering if that should be changed or adjusted.  Encouraged to call and if things worsen or change over the weekend but to give the klonopin if he has 3 seizures again.

## 2019-04-04 NOTE — Telephone Encounter (Signed)
Spoke to [employer], Software engineer.  Reported that they are unable to give vaccines out, despite them being ordered as a medication.  They are required to administer under their name.    Specifically mentioned how we are able to prescribe Shingrix for patient's to pick up from pharmacies and have others administer.  She disagreed and felt that was only able to be administered at their location.  Refused to further conversation on the matter.  Will route to PCP for further planning.

## 2019-04-07 ENCOUNTER — Encounter: Payer: Self-pay | Admitting: Gastroenterology

## 2019-04-07 NOTE — Telephone Encounter (Signed)
Can you look into where we are on the neurology referral? Looks like they needed old notes sent to them in order to establish care.  If we do not have these, may need to ask Dad for them.    We can also check a valproic acid level (last checked 09/2017) please arrange this for home draw and he is due for CBC and CMP as well (was hyponatremic in July) so need to recheck this.  Thanks

## 2019-04-07 NOTE — Telephone Encounter (Addendum)
Routed labs to [facility] Draw for the next few days.  Received confirmation

## 2019-04-08 NOTE — Telephone Encounter (Signed)
Writer faxed Rx.  Received confirmation.

## 2019-04-09 ENCOUNTER — Other Ambulatory Visit
Admission: RE | Admit: 2019-04-09 | Discharge: 2019-04-09 | Disposition: A | Discharge status: Home / Self Care | Payer: Medicare Other | Source: Ambulatory Visit | Attending: Pediatrics | Admitting: Pediatrics

## 2019-04-09 ENCOUNTER — Telehealth: Payer: Self-pay | Admitting: Pediatrics

## 2019-04-09 DIAGNOSIS — G40909 Epilepsy, unspecified, not intractable, without status epilepticus: Secondary | ICD-10-CM | POA: Insufficient documentation

## 2019-04-09 LAB — CBC AND DIFFERENTIAL
Baso # K/uL: 0 10*3/uL (ref 0.0–0.1)
Basophil %: 0.3 %
Eos # K/uL: 0.1 10*3/uL (ref 0.0–0.5)
Eosinophil %: 0.7 %
Hematocrit: 38 % — ABNORMAL LOW (ref 40–51)
Hemoglobin: 12.9 g/dL — ABNORMAL LOW (ref 13.7–17.5)
IMM Granulocytes #: 0 10*3/uL (ref 0.0–0.0)
IMM Granulocytes: 0.4 %
Lymph # K/uL: 1.9 10*3/uL (ref 1.3–3.6)
Lymphocyte %: 25.7 %
MCH: 30 pg/cell (ref 26–32)
MCHC: 34 g/dL (ref 32–37)
MCV: 87 fL (ref 79–92)
Mono # K/uL: 0.3 10*3/uL (ref 0.3–0.8)
Monocyte %: 3.9 %
Neut # K/uL: 5 10*3/uL (ref 1.8–5.4)
Nucl RBC # K/uL: 0 10*3/uL (ref 0.0–0.0)
Nucl RBC %: 0 /100 WBC (ref 0.0–0.2)
Platelets: 236 10*3/uL (ref 150–330)
RBC: 4.3 MIL/uL — ABNORMAL LOW (ref 4.6–6.1)
RDW: 12.8 % (ref 11.6–14.4)
Seg Neut %: 69 %
WBC: 7.2 10*3/uL (ref 4.2–9.1)

## 2019-04-09 LAB — COMPREHENSIVE METABOLIC PANEL
ALT: 29 U/L (ref 0–50)
AST: 17 U/L (ref 0–50)
Albumin: 4.5 g/dL (ref 3.5–5.2)
Alk Phos: 58 U/L (ref 40–130)
Anion Gap: 14 (ref 7–16)
Bilirubin,Total: 0.4 mg/dL (ref 0.0–1.2)
CO2: 20 mmol/L (ref 20–28)
Calcium: 9.6 mg/dL (ref 9.0–10.3)
Chloride: 94 mmol/L — ABNORMAL LOW (ref 96–108)
Creatinine: 0.24 mg/dL — ABNORMAL LOW (ref 0.67–1.17)
GFR,Black: 232 *
GFR,Caucasian: 201 *
Lab: <4 mg/dL — ABNORMAL LOW (ref 6–20)
Potassium: 4.1 mmol/L (ref 3.3–5.1)
Sodium: 128 mmol/L — ABNORMAL LOW (ref 133–145)
Total Protein: 7.6 g/dL (ref 6.3–7.7)

## 2019-04-09 LAB — GLUCOSE: Glucose: 134 mg/dL — ABNORMAL HIGH (ref 60–99)

## 2019-04-09 NOTE — Telephone Encounter (Signed)
[place]     Reason for call: Tonya, patient's PDN reported they are having issues getting all of the patient's supplies for his vent through [facility]. She stated that every piece needs a script and they will not send it to the patient without an order.     Writer LVM for patient's rep at [facility]  [phone] (ext. [phone]).  Requested she return call to office or fax a request of needed supplies for patient's vent.   Provided office phone number for a return call.    Name of caller: [name] for [employer]  Relationship to patient: PDN  Phone: [phone]

## 2019-04-09 NOTE — Telephone Encounter (Signed)
Left message for account executive in charge of our area. Will let you know what response I get back.

## 2019-04-09 NOTE — Telephone Encounter (Signed)
Called to [facility], unfortunately owner was missed.  Staff stated he would be in the office 9-5 on 10/1 and asked to call back again to speak with him.  To the knowledge of the pharmacists they have never done a house call but again recommended to speak to the owner.  Will attempt again tomorrow.

## 2019-04-10 LAB — VALPROIC ACID, TOTAL AND FREE
Valproic Acid, Free: 8 ug/mL (ref 5–25)
Valproic Acid,% Free: 9
Valproic Acid: 92 ug/mL (ref 50–100)

## 2019-04-11 ENCOUNTER — Telehealth: Payer: Self-pay

## 2019-04-11 NOTE — Telephone Encounter (Signed)
Called [facility] multiple times to follow up on gtube status. During the first call, information was requested, but was told I would get a return call, which has not happened yet. Two more calls have been placed, while waiting on hold and being disconnected without being able to speak to anyone. Will continue to reattempt.

## 2019-04-11 NOTE — Telephone Encounter (Signed)
Patient scheduled on 10/07 with Neurology. Writer will follow up to confirm appointment is completed.

## 2019-04-16 ENCOUNTER — Telehealth: Payer: Self-pay | Admitting: Pediatrics

## 2019-04-16 ENCOUNTER — Encounter: Payer: Self-pay | Admitting: Neurology

## 2019-04-16 ENCOUNTER — Ambulatory Visit: Payer: Medicare Other | Attending: Neurology | Admitting: Neurology

## 2019-04-16 DIAGNOSIS — S069X9S Unspecified intracranial injury with loss of consciousness of unspecified duration, sequela: Secondary | ICD-10-CM

## 2019-04-16 DIAGNOSIS — S069XAS Unspecified intracranial injury with loss of consciousness status unknown, sequela: Secondary | ICD-10-CM

## 2019-04-16 DIAGNOSIS — G40909 Epilepsy, unspecified, not intractable, without status epilepticus: Secondary | ICD-10-CM | POA: Insufficient documentation
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
## 2019-04-16 NOTE — Telephone Encounter (Signed)
[place]     Reason for call: [name], primary day nurse calling asking if they can do a home draw for [place] or possibly the nurse to do at the home. They can get the vials from the lab and want to know what the options are. They need to Kepra levels ordered by Dr. Clovis Pu today  Orders were faxed to [facility] at [phone]      Name of caller: Dalphine Handing  Relationship to patient: nurse  Organization (if applicable):   Phone:  [phone]  Fax (if applicable):

## 2019-04-16 NOTE — Telephone Encounter (Deleted)
Appointment complete. Writer will follow up to confirm appointment notes are documented and available.

## 2019-04-17 ENCOUNTER — Telehealth: Payer: Self-pay | Admitting: Pediatrics

## 2019-04-17 ENCOUNTER — Other Ambulatory Visit: Payer: Self-pay

## 2019-04-17 MED ORDER — INFLUENZA VAC SPLIT QUAD (FLULAVAL) 0.5 ML IM SUSY PF(>=6 MONTHS) *I*
0.5000 mL | PREFILLED_SYRINGE | Freq: Once | INTRAMUSCULAR | 0 refills | Status: AC
Start: 2019-04-17 — End: 2019-04-17

## 2019-04-17 NOTE — Telephone Encounter (Signed)
Spoke with [name], states she sent in post prandials after dinner, this was initiated back in July, wondering if this is supposed to continue indefinitely.    [name] also wondering if he will need to continue to have BGs checked with every meal.  States that this changed last when he was hospitalized and has continued but not been readdressed. SHe states he was not having BGs done this often in the past, also wondering if plan to continue indefinitely.    Discussed would forward questions to Dr Shanda Howells.  Merrily Pew does have an appointment on 10/14, [name] said she would likely address questions at that point as well.

## 2019-04-17 NOTE — Telephone Encounter (Signed)
[place]     Reason for call: Patient's PDN Tonya requested an updated BG protocol for patient. Caller stated that Dr. Shanda Howells requested they check patient's BG after supper. She would like to know if there is a specific value for which she needs to call the office, or if they should be sending the log to the office.     Name of caller: [name] for Dalphine Handing  Relationship to patient: PDN  Organization (if applicable): [employer]  Phone:  [phone]

## 2019-04-17 NOTE — Telephone Encounter (Signed)
Spoke with [name], day nurse.  States she has reached out to neurology regarding drawing blood work at home.  Requested she call our office if she runs into further difficulty or needs facilitation for collection.

## 2019-04-18 ENCOUNTER — Telehealth: Payer: Self-pay

## 2019-04-18 NOTE — Telephone Encounter (Signed)
Spoke with [employer] on [address] [employer].    They are able to process and dispense the flu vaccine to patient's family member.     Was unable to get a hold of parents and discussed with PDN Tonya. She will relay information to family.     Walker Kehr, PharmD, BCPS  [phone]

## 2019-04-20 ENCOUNTER — Other Ambulatory Visit: Payer: Self-pay | Admitting: Gastroenterology

## 2019-04-20 ENCOUNTER — Telehealth: Payer: Self-pay | Admitting: Primary Care

## 2019-04-20 DIAGNOSIS — R0902 Hypoxemia: Secondary | ICD-10-CM

## 2019-04-20 MED ORDER — LEVOFLOXACIN 500 MG PO TABS *I*
500.0000 mg | ORAL_TABLET | Freq: Every day | ORAL | 0 refills | Status: DC
Start: 2019-04-20 — End: 2019-06-12

## 2019-04-20 NOTE — Telephone Encounter (Addendum)
This morning Nursing took [place] off of the ventilator.  He looked very uncomfortable upon coming off of the ventilator. They noted a little bit of blood. After using the cough assist, he had an episode of hemoptysis, estimated at perhaps a table spoon.     He was struggling to breathe and he was noted to be hypoxic to the 70s this morning. Nursing put him back onto the ventilator saline and albuterol treatments, which helped some and brought his sats up "after a while." He also required a bump up in his oxygen flow to 2L. THey weaned him just recently to 1L and he is back to 94%.     They tried to take him off of the ventilator again this morning. He immediately went back down to the 70s in his saturations. He is saying that "it hurts" to speak. He acknowledges that it hurts in his chest.    The ventilator is not alarming currently. He is happy while on the vent. There have been no more episodes of hemoptysis, just trace residuals.     Vitals 110/72, HR 86, SpO2 94%    Hold cough assist tonight, can consider vesting but if it is too painful can hold.     I spoke with dad. He notes that this time of year he thinks that he has allergies as a cause of these types of symptoms. He wonders if the blood is traumatic. He notes that he has similar symptoms to this currently. Dad doesn't think that he is brewing anything. He does not want him to go to the hospital.     I recommended a CXR (dad thought so too) start levaquin 500mg  daily. Discussed with [facility]. Will get XR today.     [name], MD     Received repot from [facility] and personally reviewed images. RLL infiltrate with small effusion c/w VAP. Discussed with dad and nurse on the phone, will continue with levofloxacin. Reviewed reasons to call back (tachycardia, hypotension, poor oxygenation, or any other concerns) that may require more urgent evaluation .    Will ask [facility] RN to reach out Monday to ensure ongoing stability. I asked them to hold cough assist tonight and  trial vesting to see how tolerated. Can likely restart tomorrow if no further hemoptysis.     [name], MD

## 2019-04-21 ENCOUNTER — Encounter: Payer: Self-pay | Admitting: Gastroenterology

## 2019-04-21 NOTE — Telephone Encounter (Signed)
Attempted all phone numbers for mom and dad.   Busy or wrong numbers.

## 2019-04-21 NOTE — Telephone Encounter (Signed)
Called and left message for [employer].   Wanted to ensure have started antibiotic.   Requested a call back to the office.  Office number provided.  Noel Christmas, RN  04/21/2019  4:11 PM

## 2019-04-21 NOTE — Telephone Encounter (Signed)
Writer spoke to [employer]. [name] is inquiring whether provider wanted to see patient sooner than 10/14 at 9:30 am. [name] has requested a call back.    Phone: [phone]

## 2019-04-21 NOTE — Telephone Encounter (Addendum)
Spoke with [place].  [place] started Levaquin antibiotic yesterday.  Unable to wean patient off vent today.   O2 98% on vent on 2L.   Sleeping off and on.   Some blood with suctioning this morning. Mostly thick and yellow secretions.   Less of an appetite but still eating.   Making urine. Asking for more to drink than usual.   Has been tolerating cough assist well   [name] has been doing saline nebs.   Confirmed visit 10/14 at 9:30am.   Will route to PCP.

## 2019-04-23 ENCOUNTER — Ambulatory Visit: Payer: Medicare Other | Attending: Pediatrics | Admitting: Pediatrics

## 2019-04-23 DIAGNOSIS — E1165 Type 2 diabetes mellitus with hyperglycemia: Secondary | ICD-10-CM | POA: Insufficient documentation

## 2019-04-23 DIAGNOSIS — Z794 Long term (current) use of insulin: Secondary | ICD-10-CM | POA: Insufficient documentation

## 2019-04-23 DIAGNOSIS — J95851 Ventilator associated pneumonia: Secondary | ICD-10-CM | POA: Insufficient documentation

## 2019-04-23 DIAGNOSIS — S069XAA Unspecified intracranial injury with loss of consciousness status unknown, initial encounter: Secondary | ICD-10-CM

## 2019-04-23 DIAGNOSIS — S069X9A Unspecified intracranial injury with loss of consciousness of unspecified duration, initial encounter: Secondary | ICD-10-CM

## 2019-04-23 DIAGNOSIS — Z9911 Dependence on respirator [ventilator] status: Secondary | ICD-10-CM | POA: Insufficient documentation

## 2019-04-23 MED ORDER — LACTOBACILLUS RHAMNOSUS (GG) PO CAPS *I*
1.0000 | ORAL_CAPSULE | Freq: Every day | ORAL | 5 refills | Status: DC
Start: 2019-04-23 — End: 2020-05-13

## 2019-04-23 MED ORDER — SENNOSIDES 8.8 MG/5ML PO SYRP *I*
5.0000 mL | ORAL_SOLUTION | Freq: Every day | ORAL | 5 refills | Status: DC
Start: 2019-04-23 — End: 2019-07-14

## 2019-04-23 NOTE — Progress Notes (Signed)
Angel Wells    TELEMEDICINE VISIT  CHIEF COMPLAINT     Chief Complaint   Patient presents with    Follow-up     pneumonia    COVID-19 Concern       TELEMEDICINE CONSENT     Visit being conducted by Video in lieu of a face to face visit to minimize health care worker and patient exposure to COVID-19, and conserve PPE.    Location of Patient: home  Location of Telemedicine Provider: hospital / clinical location  Other Participants in telemedicine encounter and roles: Dad Angel Wells PDN    Consent was obtained from the patient to complete this telemedicine visit; including the potential for financial liability.  How did the patient provide consent? Verbal Consent Only    SUBJECTIVE     PNA - Over the weekend was able to get off of the vent for 1.5 hours, he currently is sleeping so he is currently on the vent.  He is currently on levaquin after CXR showed infiltrate.  Has had occasional desats but not having as much sputum build up. 2.5 L nc up from normal RA during day, he is tolerating vesting BID    Gtube - overdue for a change, still struggling to find a supplier.  This gets changed at home, they are noticing a little redness around the site, was changed out last year in September.    BG - fasting in last week: 121-150  Postprandial dinner: 134-218  levemir 65 units, 4 units with meals if BG >200    Constipation - still not going regularly despite being on lactulose TID, ok to add senna to regimen    Had recent neurology appt with Dr Angel Wells, she wants a keppra level checked    Ceiling lift has been approved (to transfer from bed to chair), [facility] to install  MEDICATIONS     Current Outpatient Medications   Medication Sig    lactobacillus rhamnosus, GG, (CULTURELLE) capsule Take 1 capsule (1 each total) by mouth daily    levoFLOXacin (LEVAQUIN) 500 MG tablet Take 1 tablet (500 mg total) by mouth daily    VALPROIC ACID 250 MG/5ML solution TAKE 5 MLS BY MOUTH 3 (THREE) TIMES DAILY     albuterol (PROVENTIL) (2.5 mg/74m) 0.083% nebulizer solution Take 3 mLs (2.5 mg total) by nebulization 2 times daily Give twice daily with vesting    albuterol (PROVENTIL) (2.5 mg/375m 0.083% nebulizer solution NEBULIZE 1 UNIT DOSE EVERY 6 HOURS PRN FOR WHEEZING OR SHORTNESS OF BREATH    bisacodyl (DULCOLAX) 10 MG suppository Please give suppository as needed  if no BM in 3 days, please check with Dad prior to giving    clonazePAM (KLONOPIN) 1 MG tablet TAKE 1 CRUSHED TABLET VIA G-TUBE AS NEEDED FOR CLUSTER SEIZURES, MAX 3 TABS BACK TO BACK    GUAIFENESIN PO Take 200 mg by mouth every 4 hours as needed (For cough or congestion)     diphenhydrAMINE (BENADRYL) 25 MG oral solid 25 mg by Per G Tube route nightly as needed for Sleep    famotidine (PEPCID) 20 MG tablet Take 20 mg by mouth 2 times daily    FIBER SELECT GUMMIES PO Take by mouth Give 2 gummies daily    Cholecalciferol (VITAMIN D3) 50 MCG (2000 UT) capsule 2,000 Units by Per G Tube route daily    cetirizine (ZYRTEC) 1 MG/ML syrup 10 mg by Per G Tube route at bedtime    Insulin Aspart (  NOVOLOG FLEXPEN SC) Inject 4 Units into the skin 3 times daily (before meals) FOR BG > 200. Alert MD for BG <70 and >250.     baclofen (LIORESAL) 10 MG tablet 10 mg by Per G Tube route 2 times daily Crush and mix with small amount of water, administer and follow with 30 ml water flush    budesonide (PULMICORT) 0.25 MG/2ML nebulizer solution Inhale 0.25 mg into the lungs 2 times daily     EPINEPHrine 0.15 mg/0.3 mL auto-injector Inject 0.15 mg into the muscle    insulin detemir (LEVEMIR FLEXPEN) 100 UNIT/ML injection pen Inject 65 Units into the skin at bedtime Give with bedtime snack    lactulose (LACTULOSE) 20 GM/30ML solution 10 g by Per G Tube route 3 times daily Hold dose for loose stools    levETIRAcetam (KEPPRA) 100 MG/ML solution GIVE 15 MLS BY PER G TUBE ROUTE 2 (TWO) TIMES DAILY.    miconazole (MICATIN) 2 % powder 2 times daily as needed      triamcinolone (KENALOG) 0.025 % cream Apply topically 2 times daily    sennosides (SENOKOT) 8.8 mg/14m syrup 5 mLs by Per G Tube route daily Hold for loose stool    Non-System Medication Please measure End Tidal CO2. Diagnosis: Ventilator dependence [Z99.11]    Feeding Tubes - Sets (MIC-KEY GASTROSTOMY KIT 14FR) MISC Please dispense every 6 months and prn, MicKey 14Fr 2.5cm    generic DME 12 F Kindel suction catheter kit Use as directed.MCAID ID [id number]   Gauze Pads & Dressings (SPLIT GAUZE DRAINAGE SPONGE) 4"X4" Use once and PRN times a day as instructed.    gauze pads 2"X2" Use as directed around trach. MCAID ID [id number]   generic DME 10 isothermal adult "antiflect" connector. MCAID [id number] as directed.    generic DME 4 adaptor with valve  4 adapted straight valve  Tubing and filter Use as directed.  MCAID ID [id number]   Nebulizer MISC Disposable nebulizer kits to be used inline with trach  MCAID ID [id number]   generic DME Suction catheter with chimney valve.Use as directed.MCAID ID [id number]   generic DME Cough assist tubing and filters.  Use as directed, replace once monthly.    generic DME Non-sterile gloves size medium Use as directed. MCAID ID [id number]/month    zinc oxide (DESITIN) 40 % paste Apply topically as needed for Diaper Rash    generic DME Trilogy vent, SIMV/PC 21, PS 13, VT 310, PEEP 5 VIA TRACH, NOCTURNAL AND PRN USE, BLEED IN 2 LPM O2 TO MAINTAIN SATS >90%, HEATED HUMIDIFIER, WEAN VENT AS TOLERATED TO KEEP SATS > 92%,MCAID ID [id number]   generic DME MCAID ID [id number] BIVONA CUFFED TRACH 7.0 Use as directed.    generic DME tRACH TIES WITH O2 PORT. Use as directed.MCAID ID [id number]   sodium chloride 0.9 % nebulizer solution Take 3 mLs by nebulization as needed for Wheezing MCAID ID [id number]   gauze sponge (CURITY) 4"X4" pads Use PRN as instructed.MCAID ID [id number]   generic DME PASSEY MUIR VALVE (PURPLE) Use as tolerated.MCAID ID [id number]   Adhesive Tape  1"x5yd TAPE By 1 Units no specified route as needed MCAID ID [id number]   generic DME 10 ml syringes Use as directed.MCAID ID [id number]   generic DME Trach care kits Use as directed.MCAID ID [id number]   surgical lubricant (SURGILUBE) gel Apply topically as needed MCAID ID [id number]   Distilled  Water LIQD Distilled water, use as directedMCAID ID [id number]    generic DME Mepilex 4X 4. Use as directed.MCAID ID [id number]    generic DME HME for trach humidification Use as directed.MCAID ID [id number]    generic DME Oxygen concentrator and portable oxygenUse as directed.MCAID ID [id number]. 2 lpm via ventilator nocturnally to maintain sats greater than 90%    generic DME Philips Cough Assist machine , settings Auto mode, 3-5 cycles 3-5 sets, +/- 20 to 40 cm h2o BID and PRN via trach tubeUse as directed.MCAID ID [id number]    generic DME Cough Assist circuits Use as directed.MCAID ID [id number]    generic DME Suction machine Use as directed.MCAID ID [id number]    generic DME Suction tubing Use as directed.MCAID ID [id number]    generic DME Suction canister Use as directed.MCAID ID [id number]    generic DME Suction filter Use as directed.MCAID ID [id number]    generic DME Nasal adapters Use as directed.MCAID ID [id number]    generic DME Pulse oximeter with continuous alarms Use as directed.MCAID ID [id number]    generic DME Pulse ox probes (disposable)Use as directed.MCAID ID [id number]    generic DME Oximeter alarms Low HR 40, High HR 200, High SaO2 off, low SaO2 83% Use as directed.MCAID ID [id number]  Hours of use nocturnal and PRN    Multiple Vitamins-Minerals (HM MULTIVITAMIN ADULT GUMMY PO) Take by mouth Give 2 gummies daily    Ascorbic Acid (VITAMIN C ADULT GUMMIES PO) Take by mouth Give 2 gummies once daily    generic DME Ventilator supplies    generic DME Portable suction machine    nebulizer device with mask and tubing Use as directed    oxygen concentrator Use with oxygen canisters as instructed    Lancet Devices  (SIMPLE DIAGNOSTICS LANCING DEV) MISC 1 each 3 times daily    lancets (ACCU-CHEK SOFTCLIX) Use   4  times per day as instructed for blood glucose testing and PRN.    generic DME Cough Assist Machine Use as directed.    oxygen 2 l/m bleed through ventilator at night    Blood Glucose Monitoring Suppl (FIFTY50 GLUCOSE METER 2.0) w/Device KIT by Other route    insulin pen needle (FIFTY50 PEN NEEDLES) 31G X 8 MM 1 each     Medications reviewed at today's visit    [place]  This visit was performed during a pandemic event, and the physical exam was limited to my Video observation of this patient's organ systems and/or body areas.   GEN:  Asleep on the vent, no distress    Physical Exam      ASSESSMENT   Today we discussed the assessment and management of the following diagnosis:    ICD-10-CM ICD-9-CM   1. Ventilator associated pneumonia  J95.851 997.31   2. TBI (traumatic brain injury)  S06.9X9A 854.00   3. Type 2 diabetes mellitus with hyperglycemia, with long-term current use of insulin  E11.65 250.00    Z79.4 790.29     V58.67   4. Ventilator dependence  Z99.11 V46.11       PLAN      1. Ventilator associated pneumonia  Complete 14 days of levaquin, continue to wean off the vent during the day as tolerated but keep his oxygen on at 2.5L with trach collar until he is back to using vent only at night.  Continue vesting BID and may increase this if needed.  Encouraged giving pedialyte/gatorade zero to help with insensible  fluid losses.  [name] reports his appetite has been better     2. TBI (traumatic brain injury)  underlying condition, all treatment decisions made with this in consideration.     3. Type 2 diabetes mellitus with hyperglycemia, with long-term current use of insulin  Continue same insulin regimen, can go back to doing QAC BG checks, no need for post prandial    Flu shot at the house but will await him recovering from this illness before giving.    4. G tube in place - he uses Gtube for  medications, fluids and nutrition when sick.  This is medically necessary to be able to provide adequate care for him especially during times of illness.  This helps prevent hospital stays when possible as we can stabilize his fluids, nutrition even when he is sick and unable to eat by mouth.  He is overdue for gtube change (this is typically done at home).    5. Constipation -will add Senna to his regimen, continue lactulose TID    6. Hyponatremia - suspect that this is multifactorial (getting more water when he is ill) and also due to AED, will continue to monitor closely, obtain BMP with next set of labs.      Time:  Over 25 minutes were spent with the patient, >50% of the time was spent in counseling regarding the above problems.    Level 3: 53mn  Level 4: 231m  Level 5: 4035m   21+  minutes was spent on the phone with the patient, patient representatives, and/or other attendees.     Return in about 4 weeks (around 05/21/2019).      ARIDoretha ImusD

## 2019-04-23 NOTE — Patient Instructions (Addendum)
Continue to check BGs QAC.   Continue levaquin until completed.  Please give flu shot once he has recovered from this illness.  Try to start weaning him off the vent first before lowering his oxygen level (use the trach collar when he is off the vent)

## 2019-04-24 ENCOUNTER — Other Ambulatory Visit
Admission: RE | Admit: 2019-04-24 | Discharge: 2019-04-24 | Disposition: A | Discharge status: Home / Self Care | Payer: Medicare Other | Source: Ambulatory Visit | Attending: Neurology | Admitting: Neurology

## 2019-04-24 DIAGNOSIS — G40109 Localization-related (focal) (partial) symptomatic epilepsy and epileptic syndromes with simple partial seizures, not intractable, without status epilepticus: Secondary | ICD-10-CM | POA: Insufficient documentation

## 2019-04-25 ENCOUNTER — Telehealth: Payer: Self-pay | Admitting: Pediatrics

## 2019-04-25 NOTE — Telephone Encounter (Signed)
Incoming paperwork for completion to the [place]  Date Received: 04/25/2019    Type of paperwork: Other - [place] and POC    Completed paperwork to be sent to:  Fax to [phone]    Special Requests:     Placed in box for: Shanda Howells

## 2019-04-25 NOTE — Telephone Encounter (Signed)
Incoming paperwork for completion to the [facility]  Date Received: 04/25/2019    Type of paperwork: Other - [employer] Revision to POC    Completed paperwork to be sent to:  Fax to [place]    Special Requests:     Placed in box for: Shanda Howells

## 2019-04-26 LAB — LEVETIRACETAM LEVEL: Levetiracetam Lvl: 23 ug/mL (ref 12–46)

## 2019-04-28 ENCOUNTER — Telehealth: Payer: Self-pay | Admitting: Pediatrics

## 2019-04-28 ENCOUNTER — Encounter: Payer: Self-pay | Admitting: Pediatrics

## 2019-04-28 ENCOUNTER — Encounter: Payer: Self-pay | Admitting: Gastroenterology

## 2019-04-28 NOTE — Telephone Encounter (Signed)
Incoming paperwork for completion to the [place]  Date Received: 04/28/2019    Type of paperwork: Other - [facility]  lift approval    Completed paperwork to be sent to:  Fax to [phone]    Special Requests:     Placed in box for: Shanda Howells

## 2019-04-28 NOTE — Telephone Encounter (Signed)
Error

## 2019-04-30 NOTE — Telephone Encounter (Signed)
Paperwork completed 04/30/2019  Sent as outlined in prior documentation  Faxed to [place]

## 2019-04-30 NOTE — Telephone Encounter (Signed)
Paperwork completed 04/30/2019  Sent as outlined in prior documentation  Faxed to [facility] [phone]

## 2019-05-01 NOTE — Telephone Encounter (Signed)
Spoke with Angel Wells as numbers were not working for patient parents.  Informed we have MicKey to be picked up, Tonya will inform mom/dad, to be picked up at front desk.    Per Angel HousemanMerrily Wells being treated for PNA, getting abx  Had blood in sputum with suction, right now doing OK, no fever but still little blood in sputum, scant amount.  Looking into suction catheters, etc.  Still cloudy secretions, has 3 more days of abx left.  Weaned down on O2, not on vent all the time, if he is using his accessory muscles then they would put him on vent, today has been off, taking nap right now and seems to be doing OK, wanted to update Dr Shanda Howells, RT at [facility].    Also FYI for Dr Shanda Howells: 2-4 weeks lift should be in, once lift is in [name] will call to make appointment.    Additional request: Dr Shanda Howells at last appt gave script for senna, not sure if it was sent to the agency or not ([facility]), information of changes has to be faxed to [facility] to change mar [phone] attn: [name].  Letter electronically faxed indicating addition of senna by Dr Shanda Howells on 10/14.

## 2019-05-04 ENCOUNTER — Encounter: Payer: Self-pay | Admitting: Neurology

## 2019-05-06 ENCOUNTER — Telehealth: Payer: Self-pay | Admitting: Pediatrics

## 2019-05-06 ENCOUNTER — Encounter: Payer: Self-pay | Admitting: Gastroenterology

## 2019-05-06 NOTE — Telephone Encounter (Signed)
Please have them increase chest PT to qid (normally does it BID) and also give albuterol every 4 hours around the clock for the next few days.  Is Angel Wells worried he aspirated?  Would also give extra fluid through his gtube while he is not feeling well (extra 241ml per day of pedialyte or water) and would not offer any PO until he is feeling better and off the vent.  Unclear if perhaps he had a mucus plug or perhaps aspirated.  Would try this first, if still not able to get off the vent we can consider antibiotic but he was just on one so would like to see if increased clearance helps. Also encoruage use of cough assist if he has it

## 2019-05-06 NOTE — Telephone Encounter (Signed)
Angel Wells     Reason for call: patient stopped taking abx on 10/24 completed full course  for pneumonia     earlier today he was very thirsty blood sugar was up to 210,   Patient was  complaining that he did not feel good and was attempting to drink more that he typically would  Pt did not want to eat lunch, patient then asked for suction    [name] Stated she could hear wheezing when he was breathing , o2 dropped to 82 after suction. Patient stated he didn't feel good and his cough does not seem strong this happened two times and on the 2nd time this happened it was put on vent    After he is put on the vent pt stated " you're the best nurse ever"    Pt is telling nurse that he wants to stay on the vent when she asks to take it off.    Patient did not urinate all day yesterday until roughly 3pm even though he is asking for fluids    Off and on today he has told the nurse he feels funny    Vitals are currently ok, using the vent currently.     BP 122/76 axillary temp 97.9 pulse 88 resp 20 o2: 93 on vent fell to 82 before going on vent     She believes there is something going on        Name of caller:Angel Wells  Relationship to patient: Nurse   Organization (if applicable):   Phone:  [phone]  Fax (if applicable):

## 2019-05-06 NOTE — Telephone Encounter (Signed)
Wondering if he did aspirate over the weekend.  Over the weekend was using cough assist.  States josh said he was feeling funny this morning"  Temp 99.1 which for him is not usual.  Yesterday more secretions and thirsty.    Advised on below, encouraged clearance per Dr Shanda Howells. Will monitor for next few days and give update prior to the weekend.  Tonya agreed, verbalized understanding and declined further questions.

## 2019-05-07 NOTE — Telephone Encounter (Signed)
Writer spoke to [employer] from home care. She reports she assessed Vonna Kotyk today. [name] did a chest assessment on right base exterior, patient is currently on the vent and maintaining vitals. [name] is inquiring if provider would like them to do frequent nebulizer treatments or if a chest x-ray order should be placed. [name] states any orders can be faxed to her.    Phone: [phone]  Fax: [phone]

## 2019-05-07 NOTE — Telephone Encounter (Signed)
Writer spoke to [employer]. She reports the verbal order that was received yesterday needs to be written and faxed over to them for their records. [name] has requested this gets faxed today.    Fax: [phone]

## 2019-05-09 NOTE — Telephone Encounter (Addendum)
Spoke with Angel Wells.     Stated Angel Wells is doing "much better."     Yesterday, lungs sounded clear, on room air until about 1600. On vent at 2000. Afebrile.     [name] at the home in the morning today.  Afebrile. On room air.  Secretions white and clear.   97% on vent. HR 70s-80s.     Trying rehydration with Gatorade when not on vent, NPO while on vent but receives fluids with G-tube.   Doing aspiration training with staff, sitting upright, tucking chin.     Doing vest treatments QID.     Voided three times yesterday.   Better appetite yesterday.     Doesn't believe chest xray is necessary at this time.     Confirmed will call us if she has any concerns.

## 2019-05-12 ENCOUNTER — Telehealth: Payer: Self-pay | Admitting: Pediatrics

## 2019-05-12 NOTE — Telephone Encounter (Signed)
Incoming paperwork for completion to the [place]  Date Received: 05/12/2019    Type of paperwork: Other - revision to plan of care    Completed paperwork to be sent to:  Fax to [phone]    Special Requests:     Placed in box for: Shanda Howells

## 2019-05-13 ENCOUNTER — Encounter: Payer: Self-pay | Admitting: Gastroenterology

## 2019-05-15 ENCOUNTER — Other Ambulatory Visit: Payer: Self-pay | Admitting: Pediatrics

## 2019-05-15 ENCOUNTER — Encounter: Payer: Self-pay | Admitting: Gastroenterology

## 2019-05-15 DIAGNOSIS — G8250 Quadriplegia, unspecified: Secondary | ICD-10-CM

## 2019-05-15 MED ORDER — GENERIC DME *A*
0 refills | Status: DC
Start: 2019-05-15 — End: 2019-06-12

## 2019-05-15 NOTE — Telephone Encounter (Signed)
Paperwork completed 05/15/2019  Sent as outlined in prior documentation

## 2019-05-15 NOTE — Telephone Encounter (Signed)
Per [name] "we are in need of a [place] Rx for the ceiling track lift. Rx needs to state "Ceiling track lift with installation".     Printed Rx faxed to [employer].  Fax [phone]

## 2019-05-16 NOTE — Telephone Encounter (Signed)
Please let Angel Wells know that he can get is flu shot next week (hopefully by then the wheezing will have recovered).  I would continue doing the vesting tid - qid until his wheezing has improved.  Once he is back to baseline as far as wheezing, using vent only at night, then they can go back down to vesting BID

## 2019-05-16 NOTE — Telephone Encounter (Signed)
Patient's PDN Tonia requested to notify the provider that patient is improving. Caller stated he is "still wheezing some".    Tonia requested to know what the next steps are, and when patient should receive his flu shot.   Caller requested to know if patient will need another X-ray to determine if he is cleared/able to receive the flu shot.     Tonia provided her phone number for a return call.  Phone: [phone]

## 2019-05-19 NOTE — Telephone Encounter (Signed)
Rx faxed to [facility]  Confirmation received

## 2019-05-19 NOTE — Telephone Encounter (Signed)
Attempted to touch base with Tonya, mailbox full and unable to leave message.    Call to [facility] with regards to update on Leonell.  Given instruction per Dr Mckinley Jewel confirmed and stated she would try and touch base with the house this afternoon.  Declined further questions or concerns and was thankful for the call.

## 2019-05-22 ENCOUNTER — Telehealth: Payer: Self-pay | Admitting: Pediatrics

## 2019-05-22 DIAGNOSIS — G8250 Quadriplegia, unspecified: Secondary | ICD-10-CM

## 2019-05-22 NOTE — Telephone Encounter (Signed)
Spoke with Angel Wells, discussed previous message regarding vesting and instructions from Dr Shanda Howells ( see TC 10/27).  Angel Wells states his breathing seems to be improved, noted minimal secretions today, but also a little wheezing.  Changed his pusy mir valve today.    Concerned about all the electrolytes he is getting and that his BG seems more elevated than previously, this afternoon was 280, this morning 186.  She states this is surprising to her as well as mom has gotten the "sugar free" version of Pedialyte.  States she has orders to give 266mL of the electrolyte solution at 9, 12, 1700, and 2100, thinking this is too frequent, unsure where that specific order came from.    ALso looking for script for a 30cc measuring cup and a 50cc syringe to be sent to [facility] Fax # [phone]    Discussed would forward above to Dr Shanda Howells to determine nature of BG and electrolyte orders and other specified needs.

## 2019-05-22 NOTE — Telephone Encounter (Signed)
[place]     Reason for call: Patient's PDN Tonia requested to speak with a nurse regarding patient's blood sugar and blood pressure. Caller stated patient recently started an electrolyte drink but she is concerned with how elevated his BG is.     Tonia provided her phone number for a return call.     Name of caller: Lauro Regulus for [facility]  Relationship to patient: PDN  Organization (if applicable): [facility]  Phone: [phone]

## 2019-05-23 NOTE — Addendum Note (Signed)
Addended by: Roxan Hockey on: 05/23/2019 02:35 PM     Modules accepted: Orders

## 2019-05-28 ENCOUNTER — Ambulatory Visit: Payer: Medicare Other | Attending: Pediatrics | Admitting: Pediatrics

## 2019-05-28 DIAGNOSIS — J9611 Chronic respiratory failure with hypoxia: Secondary | ICD-10-CM | POA: Insufficient documentation

## 2019-05-28 DIAGNOSIS — G8250 Quadriplegia, unspecified: Secondary | ICD-10-CM

## 2019-05-28 DIAGNOSIS — S069XAA Unspecified intracranial injury with loss of consciousness status unknown, initial encounter: Secondary | ICD-10-CM

## 2019-05-28 DIAGNOSIS — S069X9A Unspecified intracranial injury with loss of consciousness of unspecified duration, initial encounter: Secondary | ICD-10-CM | POA: Insufficient documentation

## 2019-05-28 DIAGNOSIS — G40909 Epilepsy, unspecified, not intractable, without status epilepticus: Secondary | ICD-10-CM

## 2019-05-28 DIAGNOSIS — Z794 Long term (current) use of insulin: Secondary | ICD-10-CM | POA: Insufficient documentation

## 2019-05-28 DIAGNOSIS — E1165 Type 2 diabetes mellitus with hyperglycemia: Secondary | ICD-10-CM | POA: Insufficient documentation

## 2019-05-28 NOTE — Patient Instructions (Addendum)
Decrease vesting and cough assist to BID now that he is well     Try to time his albuterol/vesting and saline with when he comes off the vent in the morning. Then follow this with budesonide.     Stop gatorade, only use this while he is ill.

## 2019-05-28 NOTE — Progress Notes (Signed)
[place]    TELEMEDICINE VISIT  CHIEF COMPLAINT     Chief Complaint   Patient presents with    Follow-up    Cough       TELEMEDICINE CONSENT     Visit being conducted by Video in lieu of a face to face visit to minimize health care worker and patient exposure to COVID-19, and conserve PPE.    Location of Patient: home  Location of Telemedicine Provider: hospital / clinical location  Other Participants in telemedicine encounter and roles: Dad [name] PDN    Consent was obtained from the patient to complete this telemedicine visit; including the potential for financial liability.  How did the patient provide consent? Verbal Consent Only    SUBJECTIVE     Breathing issues - back to just being on the vent at night, Tanya hears a lot of wheezing when he comes off the vent, he is getting albuterol and vest and budesonide at 8am and at 8pm, he is using a cough assist but he also can give a productive cough, she reports thick secretions from the trach in the morning, using cough assist 4 times a day, Dad reports he has always been thick in the morning and he thinks this is more allergies.     Gtube dependent for nutrition and fluids - especially when he has been sick. He takes in a lot of his nutrition by mouth, no concerns for his weight today    BG - fasting in last week: "off the charts" he's still using gatorade: 131-233  Lunch: 190-261  Postprandial dinner: 134-218  levemir 65 units, 4 units with meals if BG >200    Constipation - overall stable, a little looser with the gatorade    Ceiling lift has been approved (to transfer from bed to chair), [facility] to install it once it arrives   MEDICATIONS     Current Outpatient Medications   Medication Sig    generic DME Please dispense 109m measuring cup. Use as directed.    generic DME Please dispense one 598msyringe. Use as directed.    generic DME Ceiling track lift w/ installation    lactobacillus rhamnosus, GG, (CULTURELLE) capsule Take  1 capsule (1 each total) by mouth daily    sennosides (SENOKOT) 8.8 mg/64m61myrup 5 mLs by Per G Tube route daily Hold for loose stool    levoFLOXacin (LEVAQUIN) 500 MG tablet Take 1 tablet (500 mg total) by mouth daily    Non-System Medication Please measure End Tidal CO2. Diagnosis: Ventilator dependence [Z99.11]    Feeding Tubes - Sets (MIC-KEY GASTROSTOMY KIT 14FR) MISC Please dispense every 6 months and prn, MicKey 14Fr 2.5cm    generic DME 12 F Kindel suction catheter kit Use as directed.MCAID ID [id number] Gauze Pads & Dressings (SPLIT GAUZE DRAINAGE SPONGE) 4"X4" Use once and PRN times a day as instructed.    gauze pads 2"X2" Use as directed around trach. MCAID ID [id number] generic DME 10 isothermal adult "antiflect" connector. MCAID [id number] as directed.    generic DME 4 adaptor with valve  4 adapted straight valve  Tubing and filter Use as directed.  MCAID ID [id number] Nebulizer MISC Disposable nebulizer kits to be used inline with trach  MCAID ID [id number] generic DME Suction catheter with chimney valve.Use as directed.MCAID ID [id number] VALPROIC ACID 250 MG/5ML solution TAKE 5 MLS BY MOUTH  3 (THREE) TIMES DAILY    generic DME Cough assist tubing and filters.  Use as directed, replace once monthly.    albuterol (PROVENTIL) (2.5 mg/33m) 0.083% nebulizer solution Take 3 mLs (2.5 mg total) by nebulization 2 times daily Give twice daily with vesting    albuterol (PROVENTIL) (2.5 mg/39m 0.083% nebulizer solution NEBULIZE 1 UNIT DOSE EVERY 6 HOURS PRN FOR WHEEZING OR SHORTNESS OF BREATH    bisacodyl (DULCOLAX) 10 MG suppository Please give suppository as needed  if no BM in 3 days, please check with Dad prior to giving    clonazePAM (KLONOPIN) 1 MG tablet TAKE 1 CRUSHED TABLET VIA G-TUBE AS NEEDED FOR CLUSTER SEIZURES, MAX 3 TABS BACK TO BACK    GUAIFENESIN PO Take 200 mg by mouth every 4 hours as needed (For cough or congestion)     generic DME Non-sterile gloves size medium Use  as directed. MCAID ID [id number]/month    zinc oxide (DESITIN) 40 % paste Apply topically as needed for Diaper Rash    generic DME Trilogy vent, SIMV/PC 21, PS 13, VT 310, PEEP 5 VIA TRACH, NOCTURNAL AND PRN USE, BLEED IN 2 LPM O2 TO MAINTAIN SATS >90%, HEATED HUMIDIFIER, WEAN VENT AS TOLERATED TO KEEP SATS > 92%,MCAID ID [id number]  generic DME MCAID ID [id number] CUFFED TRACH 7.0 Use as directed.    generic DME tRACH TIES WITH O2 PORT. Use as directed.MCAID ID [id number]  sodium chloride 0.9 % nebulizer solution Take 3 mLs by nebulization as needed for Wheezing MCAID ID [id number]  gauze sponge (CURITY) 4"X4" pads Use PRN as instructed.MCAID ID [id number]  generic DME PASSEY MUIR VALVE (PURPLE) Use as tolerated.MCAID ID [id number]  Adhesive Tape 1"x5yd TAPE By 1 Units no specified route as needed MCAID ID [id number]  generic DME 10 ml syringes Use as directed.MCAID ID [id number]  generic DME Trach care kits Use as directed.MCAID ID [id number]  surgical lubricant (SURGILUBE) gel Apply topically as needed MCAID ID [id number]  Distilled Water LIQD Distilled water, use as directedMCAID ID [id number]  generic DME Mepilex 4X 4. Use as directed.MCAID ID [id number]  generic DME HME for trach humidification Use as directed.MCAID ID [id number]  generic DME Oxygen concentrator and portable oxygenUse as directed.MCAID ID [id number] lpm via ventilator nocturnally to maintain sats greater than 90%    generic DME Philips Cough Assist machine , settings Auto mode, 3-5 cycles 3-5 sets, +/- 20 to 40 cm h2o BID and PRN via trach tubeUse as directed.MCAID ID [id number]  generic DME Cough Assist circuits Use as directed.MCAID ID [id number]  generic DME Suction machine Use as directed.MCAID ID [id number]  generic DME Suction tubing Use as directed.MCAID ID [id number]  generic DME Suction canister Use as directed.MCAID ID [id number]  generic DME Suction filter Use as directed.MCAID ID [id number]   generic DME Nasal adapters Use as directed.MCAID ID [id number]  generic DME Pulse oximeter with continuous alarms Use as directed.MCAID ID [id number]  generic DME Pulse ox probes (disposable)Use as directed.MCAID ID [id number]  generic DME Oximeter alarms Low HR 40, High HR 200, High SaO2 off, low SaO2 83% Use as directed.MCAID ID [id number] of use nocturnal and PRN    diphenhydrAMINE (BENADRYL) 25 MG oral solid 25 mg by Per  G Tube route nightly as needed for Sleep    famotidine (PEPCID) 20 MG tablet Take 20 mg by mouth 2 times daily    FIBER SELECT GUMMIES PO Take by mouth Give 2 gummies daily    Multiple Vitamins-Minerals (HM MULTIVITAMIN ADULT GUMMY PO) Take by mouth Give 2 gummies daily    Ascorbic Acid (VITAMIN C ADULT GUMMIES PO) Take by mouth Give 2 gummies once daily    Cholecalciferol (VITAMIN D3) 50 MCG (2000 UT) capsule 2,000 Units by Per G Tube route daily    cetirizine (ZYRTEC) 1 MG/ML syrup 10 mg by Per G Tube route at bedtime    Insulin Aspart (NOVOLOG FLEXPEN SC) Inject 4 Units into the skin 3 times daily (before meals) FOR BG > 200. Alert MD for BG <70 and >250.     generic DME Ventilator supplies    generic DME Portable suction machine    nebulizer device with mask and tubing Use as directed    oxygen concentrator Use with oxygen canisters as instructed    Lancet Devices (SIMPLE DIAGNOSTICS LANCING DEV) MISC 1 each 3 times daily    lancets (ACCU-CHEK SOFTCLIX) Use   4  times per day as instructed for blood glucose testing and PRN.    generic DME Cough Assist Machine Use as directed.    oxygen 2 l/m bleed through ventilator at night    baclofen (LIORESAL) 10 MG tablet 10 mg by Per G Tube route 2 times daily Crush and mix with small amount of water, administer and follow with 30 ml water flush    Blood Glucose Monitoring Suppl (FIFTY50 GLUCOSE METER 2.0) w/Device KIT by Other route    budesonide (PULMICORT) 0.25 MG/2ML nebulizer solution Inhale 0.25 mg into the lungs 2  times daily     EPINEPHrine 0.15 mg/0.3 mL auto-injector Inject 0.15 mg into the muscle    insulin detemir (LEVEMIR FLEXPEN) 100 UNIT/ML injection pen Inject 65 Units into the skin at bedtime Give with bedtime snack    lactulose (LACTULOSE) 20 GM/30ML solution 10 g by Per G Tube route 3 times daily Hold dose for loose stools    levETIRAcetam (KEPPRA) 100 MG/ML solution GIVE 15 MLS BY PER G TUBE ROUTE 2 (TWO) TIMES DAILY.    miconazole (MICATIN) 2 % powder 2 times daily as needed     triamcinolone (KENALOG) 0.025 % cream Apply topically 2 times daily    insulin pen needle (FIFTY50 PEN NEEDLES) 31G X 8 MM 1 each     Medications reviewed at today's visit    [place]  This visit was performed during a pandemic event, and the physical exam was limited to my Video observation of this patient's organ systems and/or body areas.   GEN:  Asleep on the vent, no distress    Physical Exam      ASSESSMENT   Today we discussed the assessment and management of the following diagnosis:    ICD-10-CM ICD-9-CM   1. TBI (traumatic brain injury)  S06.9X9A 854.00   2. 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 Chronic respiratory failure with hypoxia  J96.11 518.83     799.02   5. Type 2 diabetes mellitus with hyperglycemia, with long-term current use of insulin  E11.65 250.00    Z79.4 790.29     V58.67       PLAN      1. Ventilator associated pneumonia  Reviewed  with Dad and nurse the order of medicines with relation to vesting.  Now that he is well, can go back down to vesting BID and may increase this if needed.  Continue to use cough assist BID.  Would give nebulized meds via trach right before he comes off the vent to see if this eases his transition off of the vent     2. TBI (traumatic brain injury)  underlying condition, all treatment decisions made with this in consideration.     3. Type 2 diabetes mellitus with hyperglycemia, with long-term current use of  insulin  Continue same insulin regimen, can go back to doing QAC BG checks, no need for post prandial    Flu shot at the house but will await him recovering from this illness before giving.    4. G tube in place - he uses Gtube for medications, fluids and nutrition when sick.  This is medically necessary to be able to provide adequate care for him especially during times of illness.  This helps prevent hospital stays when possible as we can stabilize his fluids, nutrition even when he is sick and unable to eat by mouth.  He is overdue for gtube change (this is typically done at home).    5. Constipation -continue current regimen with senna/lactulose        Time:  Over 25 minutes were spent with the patient, >50% of the time was spent in counseling regarding the above problems.      21+  minutes was spent on the phone with the patient, patient representatives, and/or other attendees.     Return in about 4 weeks (around 06/25/2019) for Dr Shanda Howells.      Doretha Imus, MD

## 2019-05-30 MED ORDER — GENERIC DME *A*
5 refills | Status: DC
Start: 2019-05-30 — End: 2022-06-23

## 2019-05-30 MED ORDER — GENERIC DME *A*
5 refills | Status: DC
Start: 2019-05-30 — End: 2019-07-09

## 2019-05-30 NOTE — Addendum Note (Signed)
Addended by: Doretha Imus on: 05/30/2019 12:09 PM     Modules accepted: Orders

## 2019-05-30 NOTE — Telephone Encounter (Signed)
Rx faxed to [facility]   Confirmation received

## 2019-06-01 ENCOUNTER — Telehealth: Payer: Self-pay | Admitting: Primary Care

## 2019-06-01 NOTE — Telephone Encounter (Addendum)
[place]    Date of call:  06/01/2019  Time of call: 4:49 PM     Megan RN Angel Wells (DOB [birth year]) called reporting wheezing    HISTORY   Current RN calling after just finishing with Angel Wells.   They have a couple of questions.    They are under the understanding that he should not get any breathing treatments while on the ventilator.    The RN shares with me that dad doesn't remember this being said.    The day nurse said that there is an order for "zero treatment" while on the ventilator. He has not been getting any treatments while on the vent.    Looking at Dr. Ivonne Andrew patient instructions, it looks like it says "albuterol/vesting and saline with when he comes off the vent in the morning. Then follow this with budesonide."   The house RN reports that he was wheezing after his treatments today without any distress.    No respiratory distress, hypoxia.    He has been getting fewer albuterol treatments   Currently vesting BID, with prn cough assist.     ASSESSMENT/PLAN      It sounds like Angel Wells has some wheezing in the context of decreased albuterol administration, this is the Megan's suspicion that less albuterol has been given.   Would recommend increasing albuterol rather than restarting atrovent.    Discussed that I recommend:   Ideally treatments are done off of the vent, but I recommend q6h albuterol as needed that can continue while he is on the ventilator.   Ideally budesonide is done off of the vent, but can be given while ventilated.    Did discuss that I wouldn't use the vest or cough assist on the ventilator. They weren't doing this already. Continue this.    Call if any worsening respiratory symptoms, secretions, hypoxia. We would then consider increasing the intensity of his airway clearance.     I encouraged them to call tomorrow morning as well.     Patient advised to call back if symptoms worsen, change or progress.    Author: [name], MD  06/01/2019 at 4:49 PM

## 2019-06-03 NOTE — Telephone Encounter (Signed)
I followed up with Angel Wells and there was some confusion about how to give PRN treatments, Thus has been cleared up and there are no more concerns.

## 2019-06-10 ENCOUNTER — Telehealth: Payer: Self-pay | Admitting: Pediatrics

## 2019-06-10 NOTE — Telephone Encounter (Signed)
Angel Wells     Reason for call: [name] from nurse core reports that patient has been exposed to covid by one of the nurses on Saturday. She states that patient last night was wheezing and experiencing respiratory issues but is feeling better today. [name] states she will be visiting patient after work today and will call [place] to update.      Name of caller:[name] for [facility] [employer]  Relationship to patient: Nurse  Organization (if applicable): nurse core  Phone: [phone]

## 2019-06-11 NOTE — Telephone Encounter (Signed)
Spoke to [name], Licensed conveyancer at [employer]	[place].  Reported that Merrily Pew had a confirmed exposure at his house.  New RN was there on 11/21 for a few hours for orientation.  And on 11/28 was there for a full 8 hour shift.  Staff was symptomatic after and was tested positive.    Reports that [name] was there on 12/1 and pt seemed to be very well.  HR 80, O2 94% but with very "musical lung sounds".  More wheezy and more secretions (but clear).  Has been increasingly wheezy and with more secretions PRIOR to 11/28 interaction with RN.  No other symptoms noted.    Will hold on swabbing at this immediate time.  Aware of quarantining house (reports they are already doing this).  Booked appt with PCP for 12/3 at 4:30p.  Will route to PCP for updates.      Email: [name]    Zoom: [email or web address]

## 2019-06-12 ENCOUNTER — Ambulatory Visit: Payer: Medicare Other | Attending: Pediatrics | Admitting: Pediatrics

## 2019-06-12 ENCOUNTER — Encounter: Payer: Self-pay | Admitting: Pediatrics

## 2019-06-12 VITALS — BP 106/63 | HR 77 | Temp 97.3°F

## 2019-06-12 DIAGNOSIS — Z20822 Contact with and (suspected) exposure to covid-19: Secondary | ICD-10-CM

## 2019-06-12 DIAGNOSIS — S069XAA Unspecified intracranial injury with loss of consciousness status unknown, initial encounter: Secondary | ICD-10-CM

## 2019-06-12 DIAGNOSIS — J45909 Unspecified asthma, uncomplicated: Secondary | ICD-10-CM | POA: Insufficient documentation

## 2019-06-12 DIAGNOSIS — S069X9A Unspecified intracranial injury with loss of consciousness of unspecified duration, initial encounter: Secondary | ICD-10-CM | POA: Insufficient documentation

## 2019-06-12 DIAGNOSIS — E1165 Type 2 diabetes mellitus with hyperglycemia: Secondary | ICD-10-CM | POA: Insufficient documentation

## 2019-06-12 DIAGNOSIS — Z20828 Contact with and (suspected) exposure to other viral communicable diseases: Secondary | ICD-10-CM | POA: Insufficient documentation

## 2019-06-12 DIAGNOSIS — Z9911 Dependence on respirator [ventilator] status: Secondary | ICD-10-CM | POA: Insufficient documentation

## 2019-06-12 DIAGNOSIS — J309 Allergic rhinitis, unspecified: Secondary | ICD-10-CM | POA: Insufficient documentation

## 2019-06-12 DIAGNOSIS — R300 Dysuria: Secondary | ICD-10-CM | POA: Insufficient documentation

## 2019-06-12 DIAGNOSIS — Z794 Long term (current) use of insulin: Secondary | ICD-10-CM | POA: Insufficient documentation

## 2019-06-12 MED ORDER — IPRATROPIUM BROMIDE 0.02 % IN SOLN *I*
500.0000 ug | Freq: Two times a day (BID) | RESPIRATORY_TRACT | 5 refills | Status: DC
Start: 2019-06-12 — End: 2019-06-16

## 2019-06-12 MED ORDER — FLUTICASONE PROPIONATE 50 MCG/ACT NA SUSP *I*
1.0000 | Freq: Every day | NASAL | 5 refills | Status: DC
Start: 2019-06-12 — End: 2019-07-16

## 2019-06-12 NOTE — Patient Instructions (Addendum)
Adding Atrovent neb BID  Start flonase one spray each nares once daily, follow with oral care  Drop off urine culture   Continue giving 4 units if premeal sugars are >200  If sugars are >250 then give 6 units.    Hold off on eye drops for now

## 2019-06-12 NOTE — Progress Notes (Signed)
[place]    TELEMEDICINE VISIT  CHIEF COMPLAINT     Chief Complaint   Patient presents with    Follow-up    COVID-19 Concern    Asthma       TELEMEDICINE CONSENT     Visit being conducted by Video in lieu of a face to face visit to minimize health care worker and patient exposure to COVID-19, and conserve PPE.    Location of Patient: home  Location of Telemedicine Provider: hospital / clinical location  Other Participants in telemedicine encounter and roles: Dad [name] PDN, [name] RN    Consent was obtained from the patient to complete this telemedicine visit; including the potential for financial liability.  How did the patient provide consent? Form completed    SUBJECTIVE     COVID exposure masked RN without symptoms on 11/28 for 8 hours shift caring for [place].  RN Tested positive on 11/30, Dad is asymptomatic, Geno is having wheezing but this was present prior to this interaction.  no other RN are symptomatic  O2 sats have been in the 94-96, but having more wheezing, asking about restarting atrovent, previously was on duoneb but this is not covered by insurance.  He is using vent at night and not requiring support during the day    DM2  BG - fasting in last week: 128-180  Lunch: 136-255  Dinner: 158-248  levemir 65 units, 4 units with meals if BG >200, no SS    Always says his eyes hurt, + itchy, minimal drainage that is white, mild injection but clears by the end of the day, also wondering if he can get nasal spray to help with allergies    Foul odor urine - no blood, he is getting 2077m water at baseline, sometimes goes all day without voiding and then he'll have a large void, Dad states in the past with UTI he gets blood in his urine    Constipation - had BM yesterday on his own, will occas need suppository to get him to go    MEDICATIONS     Current Outpatient Medications   Medication Sig    generic DME Please dispense 332mmeasuring cup. Use as directed.    generic DME  Please dispense one 5072myringe. Use as directed.    lactobacillus rhamnosus, GG, (CULTURELLE) capsule Take 1 capsule (1 each total) by mouth daily    sennosides (SENOKOT) 8.8 mg/5mL22mrup 5 mLs by Per G Tube route daily Hold for loose stool    Feeding Tubes - Sets (MIC-KEY GASTROSTOMY KIT 14FR) MISC Please dispense every 6 months and prn, MicKey 14Fr 2.5cm    Gauze Pads & Dressings (SPLIT GAUZE DRAINAGE SPONGE) 4"X4" Use once and PRN times a day as instructed.    gauze pads 2"X2" Use as directed around trach. MCAID ID [id number] DME 10 isothermal adult "antiflect" connector. MCAID [id number] as directed.    Nebulizer MISC Disposable nebulizer kits to be used inline with trach  MCAID ID [id number] DME Suction catheter with chimney valve.Use as directed.MCAID ID [id number] ACID 250 MG/5ML solution TAKE 5 MLS BY MOUTH 3 (THREE) TIMES DAILY    albuterol (PROVENTIL) (2.5 mg/3mL)56m083% nebulizer solution Take 3 mLs (2.5 mg total) by nebulization 2 times daily Give twice daily with vesting    bisacodyl (DULCOLAX) 10 MG suppository Please give suppository as needed  if no BM in 3  days, please check with Dad prior to giving    clonazePAM (KLONOPIN) 1 MG tablet TAKE 1 CRUSHED TABLET VIA G-TUBE AS NEEDED FOR CLUSTER SEIZURES, MAX 3 TABS BACK TO BACK    generic DME Non-sterile gloves size medium Use as directed. MCAID ID [id number] 3boxes/month    zinc oxide (DESITIN) 40 % paste Apply topically as needed for Diaper Rash    generic DME MCAID ID [id number], BIVONA CUFFED TRACH 7.0 Use as directed.    generic DME tRACH TIES WITH O2 PORT. Use as directed.MCAID ID [id number]    gauze sponge (CURITY) 4"X4" pads Use PRN as instructed.MCAID ID [id number]    generic DME PASSEY MUIR VALVE (PURPLE) Use as tolerated.MCAID ID [id number]    Adhesive Tape 1"x5yd TAPE By 1 Units no specified route as needed MCAID ID [id number]    generic DME 10 ml syringes Use as directed.MCAID ID [id number]    generic  DME Trach care kits Use as directed.MCAID ID [id number]    surgical lubricant (SURGILUBE) gel Apply topically as needed MCAID ID [id number]    Distilled Water LIQD Distilled water, use as directedMCAID ID [id number]    generic DME Mepilex 4X 4. Use as directed.MCAID ID [id number]    generic DME HME for trach humidification Use as directed.MCAID ID [id number]    generic DME Cough Assist circuits Use as directed.MCAID ID [id number]    generic DME Suction tubing Use as directed.MCAID ID [id number]    generic DME Suction canister Use as directed.MCAID ID [id number]    generic DME Suction filter Use as directed.MCAID ID [id number]    generic DME Nasal adapters Use as directed.MCAID ID [id number]    generic DME Pulse ox probes (disposable)Use as directed.MCAID ID [id number]    diphenhydrAMINE (BENADRYL) 25 MG oral solid 25 mg by Per G Tube route nightly as needed for Sleep    famotidine (PEPCID) 20 MG tablet Take 20 mg by mouth 2 times daily    FIBER SELECT GUMMIES PO Take by mouth Give 2 gummies daily    Multiple Vitamins-Minerals (HM MULTIVITAMIN ADULT GUMMY PO) Take by mouth Give 2 gummies daily    Ascorbic Acid (VITAMIN C ADULT GUMMIES PO) Take by mouth Give 2 gummies once daily    Cholecalciferol (VITAMIN D3) 50 MCG (2000 UT) capsule 2,000 Units by Per G Tube route daily    cetirizine (ZYRTEC) 1 MG/ML syrup 10 mg by Per G Tube route at bedtime    Insulin Aspart (NOVOLOG FLEXPEN SC) Inject 4 Units into the skin 3 times daily (before meals) FOR BG > 200. Alert MD for BG <70 and >250.     lancets (ACCU-CHEK SOFTCLIX) Use   4  times per day as instructed for blood glucose testing and PRN.    oxygen 2 l/m bleed through ventilator at night    baclofen (LIORESAL) 10 MG tablet 10 mg by Per G Tube route 2 times daily Crush and mix with small amount of water, administer and follow with 30 ml water flush    Blood Glucose Monitoring Suppl (FIFTY50 GLUCOSE METER 2.0) w/Device KIT by Other route    budesonide (PULMICORT) 0.25  MG/2ML nebulizer solution Inhale 0.25 mg into the lungs 2 times daily     insulin detemir (LEVEMIR FLEXPEN) 100 UNIT/ML injection pen Inject 65 Units into the skin at bedtime Give with bedtime snack    lactulose (LACTULOSE) 20 GM/30ML solution 10 g by Per G Tube route 3 times daily Hold dose for loose stools  levETIRAcetam (KEPPRA) 100 MG/ML solution GIVE 15 MLS BY PER G TUBE ROUTE 2 (TWO) TIMES DAILY.    miconazole (MICATIN) 2 % powder 2 times daily as needed     triamcinolone (KENALOG) 0.025 % cream Apply topically 2 times daily    insulin pen needle (FIFTY50 PEN NEEDLES) 31G X 8 MM 1 each    ipratropium (ATROVENT) 0.02 % nebulizer solution Inhale 2.5 mLs (500 mcg total) by nebulization 2 times daily    fluticasone (FLONASE) 50 MCG/ACT nasal spray Spray 1 spray into nostril daily    GUAIFENESIN PO Take 200 mg by mouth every 4 hours as needed (For cough or congestion)     sodium chloride 0.9 % nebulizer solution Take 3 mLs by nebulization as needed for Wheezing MCAID ID [id number]    EPINEPHrine 0.15 mg/0.3 mL auto-injector Inject 0.15 mg into the muscle     Medications reviewed at today's visit    [place]  This visit was performed during a pandemic event, and the physical exam was limited to my Video observation of this patient's organ systems and/or body areas.   GEN:  Seen in bed, awake, alert no acute distress  Resp: no respiratory distress    Physical Exam      ASSESSMENT   Today we discussed the assessment and management of the following diagnosis:    ICD-10-CM ICD-9-CM   1. Asthma, unspecified asthma severity, unspecified whether complicated, unspecified whether persistent  J45.909 493.90   2. Dysuria  R30.0 788.1   3. TBI (traumatic brain injury)  S06.9X9A 854.00   4. Type 2 diabetes mellitus with hyperglycemia, with long-term current use of insulin  E11.65 250.00    Z79.4 790.29     V58.67   5. Ventilator dependence  Z99.11 V46.11   6. Exposure to COVID-19 virus  Z20.828 V01.79    7. Allergic rhinitis, unspecified seasonality, unspecified trigger  J30.9 477.9       PLAN      1. 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 TBI (traumatic brain injury)  underlying condition, all treatment decisions made with this in consideration.     3. Type 2 diabetes mellitus with hyperglycemia, with long-term current use of insulin  Continue same insulin regimen, but will add 6 units if premeal BG is >250.  Continue levemir at same dose    4. Allergic Rhinitis - will add flonase spray to see if this helps with allergies and itchy eyes    5. Constipation -continue current regimen with senna/lactulose    6. Foul smelling odor to urine - will check urinalysis and culture, encouraged keeping up with bowel movements and giving a few more bolus of water thru gtube if he's not keeping up with fluids.  Would await urine results before sending in antibiotic. He is not complaining of any belly pain    Time:  Over 25 minutes were spent with the patient, >50% of the time was spent in counseling regarding the above problems.      21+  minutes was spent on the phone with the patient, patient representatives, and/or other attendees.     Return in about 2 weeks (around 06/26/2019) for Dr Shanda Howells.      Doretha Imus, MD

## 2019-06-12 NOTE — Progress Notes (Signed)
Spoke to [facility], nurse, [place] doing well but still wheezing and they are wondering about restarting Atrovent. Want to discuss the following at the visit:  ? COVID swab  Starting nasal spray  Elevated blood sugars  What labs need to be done (specifically chem-8, A1C, urine culture)  [name] complaining of eye pain and may want to try eye drops for him.

## 2019-06-13 ENCOUNTER — Other Ambulatory Visit: Payer: Self-pay | Admitting: Pediatrics

## 2019-06-13 DIAGNOSIS — M2450 Contracture, unspecified joint: Secondary | ICD-10-CM

## 2019-06-13 DIAGNOSIS — IMO0002 Reserved for concepts with insufficient information to code with codable children: Secondary | ICD-10-CM

## 2019-06-13 DIAGNOSIS — Z794 Long term (current) use of insulin: Secondary | ICD-10-CM

## 2019-06-16 ENCOUNTER — Other Ambulatory Visit
Admission: RE | Admit: 2019-06-16 | Discharge: 2019-06-16 | Disposition: A | Discharge status: Home / Self Care | Payer: Medicare Other | Source: Ambulatory Visit | Attending: Pediatrics | Admitting: Pediatrics

## 2019-06-16 ENCOUNTER — Other Ambulatory Visit: Payer: Self-pay | Admitting: Pediatrics

## 2019-06-16 DIAGNOSIS — J4540 Moderate persistent asthma, uncomplicated: Secondary | ICD-10-CM

## 2019-06-16 DIAGNOSIS — R8290 Unspecified abnormal findings in urine: Secondary | ICD-10-CM | POA: Insufficient documentation

## 2019-06-16 LAB — URINALYSIS REFLEX TO CULTURE
Blood,UA: NEGATIVE
Glucose,UA: NEGATIVE mg/dL
Ketones, UA: NEGATIVE
Nitrite,UA: NEGATIVE
Protein,UA: NEGATIVE mg/dL
Specific Gravity,UA: 1.011 (ref 1.002–1.030)
pH,UA: 7 (ref 5.0–8.0)

## 2019-06-16 LAB — URINE MICROSCOPIC (IQ200)
Bacteria,UA: NONE SEEN
Hyaline Casts,UA: NONE SEEN /lpf (ref 0–5)

## 2019-06-17 ENCOUNTER — Other Ambulatory Visit
Admission: RE | Admit: 2019-06-17 | Discharge: 2019-06-17 | Disposition: A | Discharge status: Home / Self Care | Payer: Medicare Other | Source: Ambulatory Visit | Attending: Pediatrics | Admitting: Pediatrics

## 2019-06-17 DIAGNOSIS — S069X0S Unspecified intracranial injury without loss of consciousness, sequela: Secondary | ICD-10-CM | POA: Insufficient documentation

## 2019-06-17 DIAGNOSIS — E119 Type 2 diabetes mellitus without complications: Secondary | ICD-10-CM | POA: Insufficient documentation

## 2019-06-17 DIAGNOSIS — K219 Gastro-esophageal reflux disease without esophagitis: Secondary | ICD-10-CM | POA: Insufficient documentation

## 2019-06-17 DIAGNOSIS — G839 Paralytic syndrome, unspecified: Secondary | ICD-10-CM | POA: Insufficient documentation

## 2019-06-17 DIAGNOSIS — Y9289 Other specified places as the place of occurrence of the external cause: Secondary | ICD-10-CM | POA: Insufficient documentation

## 2019-06-17 DIAGNOSIS — X58XXXS Exposure to other specified factors, sequela: Secondary | ICD-10-CM | POA: Insufficient documentation

## 2019-06-17 LAB — HEMOGLOBIN A1C: Hemoglobin A1C: 6.4 % — ABNORMAL HIGH

## 2019-06-17 LAB — BASIC METABOLIC PANEL
Anion Gap: 14 (ref 7–16)
CO2: 24 mmol/L (ref 20–28)
Calcium: 9.5 mg/dL (ref 9.0–10.3)
Chloride: 96 mmol/L (ref 96–108)
Creatinine: 0.35 mg/dL — ABNORMAL LOW (ref 0.67–1.17)
GFR,Black: 199 *
GFR,Caucasian: 172 *
Glucose: 121 mg/dL — ABNORMAL HIGH (ref 60–99)
Lab: <4 mg/dL — ABNORMAL LOW (ref 6–20)
Potassium: 4.3 mmol/L (ref 3.3–5.1)
Sodium: 134 mmol/L (ref 133–145)

## 2019-06-17 MED ORDER — IPRATROPIUM BROMIDE 0.02 % IN SOLN *I*
500.0000 ug | Freq: Two times a day (BID) | RESPIRATORY_TRACT | 5 refills | Status: DC
Start: 2019-06-17 — End: 2019-11-06

## 2019-06-17 NOTE — Telephone Encounter (Signed)
Incoming fax from [facility] stating DX code is needed for script.    Codes E11.65 and Z79.4 used for script.    Please resend pended script to [facility] [address].

## 2019-06-18 ENCOUNTER — Encounter: Payer: Self-pay | Admitting: Gastroenterology

## 2019-06-18 ENCOUNTER — Telehealth: Payer: Self-pay | Admitting: Pediatrics

## 2019-06-18 MED ORDER — ACCU-CHEK AVIVA PLUS VI STRP *A*
ORAL_STRIP | 5 refills | Status: DC
Start: 2019-06-18 — End: 2019-06-20

## 2019-06-18 NOTE — Telephone Encounter (Signed)
Incoming paperwork for completion to the [facility]  Date Received: 06/18/2019    Type of paperwork: MD order [facility] vent orders    Completed paperwork to be sent to:  Fax to [phone]    Special Requests: chart notes    Placed in box for: Shanda Howells

## 2019-06-18 NOTE — Telephone Encounter (Signed)
Paperwork completed 06/18/2019  Sent as outlined in prior documentation

## 2019-06-20 ENCOUNTER — Other Ambulatory Visit: Payer: Self-pay | Admitting: Pediatrics

## 2019-06-20 DIAGNOSIS — Z794 Long term (current) use of insulin: Secondary | ICD-10-CM

## 2019-06-20 DIAGNOSIS — IMO0002 Reserved for concepts with insufficient information to code with codable children: Secondary | ICD-10-CM

## 2019-06-20 MED ORDER — ACCU-CHEK AVIVA PLUS VI STRP *A*
ORAL_STRIP | 5 refills | Status: DC
Start: 2019-06-20 — End: 2019-12-30

## 2019-06-24 ENCOUNTER — Telehealth: Payer: Self-pay | Admitting: Pediatrics

## 2019-06-24 ENCOUNTER — Encounter: Payer: Self-pay | Admitting: Gastroenterology

## 2019-06-24 NOTE — Telephone Encounter (Signed)
Can you please reach out and reschedule the appointment scheduled for 12/24 with Dr. Shanda Howells due to canceling afternoon clinic for the holiday  Thank you

## 2019-06-27 ENCOUNTER — Telehealth: Payer: Self-pay | Admitting: Pediatrics

## 2019-06-27 NOTE — Telephone Encounter (Signed)
Incoming paperwork for completion to the [facility]  Date Received: 06/27/2019    Type of paperwork (sender:form type): [facility] Confirmation of Orders    Completed paperwork to be sent to:  Fax to [phone]    Special Requests:     Placed in box for: Shanda Howells

## 2019-06-27 NOTE — Telephone Encounter (Signed)
Incoming paperwork for completion to the [facility]  Date Received: 06/27/2019    Type of paperwork (sender:form type): revision to plan of care from 06/12/19 and discharge summary.     Completed paperwork to be sent to:  Fax to [phone]    Special Requests:     Placed in box for: Shanda Howells

## 2019-06-30 ENCOUNTER — Encounter: Payer: Self-pay | Admitting: Gastroenterology

## 2019-06-30 NOTE — Telephone Encounter (Signed)
Paperwork completed 06/30/2019  Sent as outlined in prior documentation

## 2019-07-01 ENCOUNTER — Other Ambulatory Visit: Payer: Self-pay | Admitting: Pediatrics

## 2019-07-01 DIAGNOSIS — E1165 Type 2 diabetes mellitus with hyperglycemia: Secondary | ICD-10-CM

## 2019-07-01 DIAGNOSIS — L309 Dermatitis, unspecified: Secondary | ICD-10-CM

## 2019-07-01 DIAGNOSIS — Z794 Long term (current) use of insulin: Secondary | ICD-10-CM

## 2019-07-01 NOTE — Telephone Encounter (Signed)
Paperwork completed 07/01/2019  Sent as outlined in prior documentation

## 2019-07-03 ENCOUNTER — Encounter: Payer: Self-pay | Admitting: Pediatrics

## 2019-07-03 ENCOUNTER — Telehealth: Payer: Self-pay | Admitting: Pediatrics

## 2019-07-03 ENCOUNTER — Ambulatory Visit: Payer: Medicare Other | Attending: Pediatrics | Admitting: Pediatrics

## 2019-07-03 DIAGNOSIS — S069XAA Unspecified intracranial injury with loss of consciousness status unknown, initial encounter: Secondary | ICD-10-CM

## 2019-07-03 DIAGNOSIS — E1165 Type 2 diabetes mellitus with hyperglycemia: Secondary | ICD-10-CM | POA: Insufficient documentation

## 2019-07-03 DIAGNOSIS — J9600 Acute respiratory failure, unspecified whether with hypoxia or hypercapnia: Secondary | ICD-10-CM | POA: Insufficient documentation

## 2019-07-03 DIAGNOSIS — Z794 Long term (current) use of insulin: Secondary | ICD-10-CM | POA: Insufficient documentation

## 2019-07-03 DIAGNOSIS — S069X9A Unspecified intracranial injury with loss of consciousness of unspecified duration, initial encounter: Secondary | ICD-10-CM | POA: Insufficient documentation

## 2019-07-03 DIAGNOSIS — G8250 Quadriplegia, unspecified: Secondary | ICD-10-CM | POA: Insufficient documentation

## 2019-07-03 DIAGNOSIS — Z9911 Dependence on respirator [ventilator] status: Secondary | ICD-10-CM | POA: Insufficient documentation

## 2019-07-03 MED ORDER — GENERIC DME *A*
5 refills | Status: DC
Start: 2019-07-03 — End: 2022-06-23

## 2019-07-03 NOTE — Progress Notes (Signed)
[facility]    TELEMEDICINE VISIT  CHIEF COMPLAINT     Chief Complaint   Patient presents with    Follow-up    Diabetes    Seizures       TELEMEDICINE CONSENT     Visit being conducted by Video in lieu of a face to face visit to minimize health care worker and patient exposure to COVID-19, and conserve PPE.    Location of Patient: home  Location of Telemedicine Provider: hospital / clinical location  Other Participants in telemedicine encounter and roles: Dad [name] PDN, G    Consent was obtained from the patient to complete this telemedicine visit; including the potential for financial liability.  How did the patient provide consent? Form completed    SUBJECTIVE     Doing better after starting atrovent nebs along with the nasal spray    DM2 - HbA1c 6.4, Dad reports they have been eating badly due to holiday time so he'd rather not change dosing given his HbA1c was in range  BG - fasting in last week: 181-200  Lunch: 231-250  Dinner: 154-195  levemir 65 units, 4 units with meals if BG >200, 6 units if BG>250    Vent dependent - clear thin secretions, O2 sats have been good    Seizures - more active lately, he has been doing more screen time, Merrily Pew gets more excited with the holidays and they see more seizure typically at this time, seizures are typically less then a few minutes long but are scattered.    Constipation - had BM yesterday on his own, will occas need suppository to get him to go    MEDICATIONS     Current Outpatient Medications   Medication Sig    B-D UF III MINI PEN NEEDLES 31G X 5 MM USE 3 (THREE) TIMES DAILY AS NEEDED.    famotidine (PEPCID) 20 MG tablet TAKE 1 TABLET BY MOUTH 2 (TWO) TIMES DAILY. OK TO CRUSH IT AND PUT IT DOWN TUBE IF NECESSARY    VALPROIC ACID 250 MG/5ML solution TAKE 5 MLS BY MOUTH 3 (THREE) TIMES DAILY    generic DME Please dispense one 640m syringe. Use as directed.    generic DME MCAID ID [id number] BIVONA CUFFED TRACH 7.0 Please change trach  every month and as needed.    triamcinolone (KENALOG) 0.025 % cream APPLY TO AFFECTED AREA TWICE A DAY    Accu-Chek FastClix Lancets MISC 1 EACH BY MISC.(NON-DRUG COMBO ROUTE) ROUTE 3 (THREE) TIMES DAILY. E11.65    blood glucose (ACCU-CHEK AVIVA PLUS) test strip USE 1 STRIP 3 (THREE) TIMES DAILY and  AS NEEDED.Dx: E11.65 and Z79.4    ipratropium (ATROVENT) 0.02 % nebulizer solution Inhale 2.5 mLs (500 mcg total) by nebulization 2 times daily    baclofen (LIORESAL) 10 MG tablet TAKE 1 TABLET BY MOUTH TWICE A DAY    fluticasone (FLONASE) 50 MCG/ACT nasal spray Spray 1 spray into nostril daily    generic DME Please dispense 327mmeasuring cup. Use as directed.    lactobacillus rhamnosus, GG, (CULTURELLE) capsule Take 1 capsule (1 each total) by mouth daily    sennosides (SENOKOT) 8.8 mg/40m60myrup 5 mLs by Per G Tube route daily Hold for loose stool    Feeding Tubes - Sets (MIC-KEY GASTROSTOMY KIT 14FR) MISC Please dispense every 6 months and prn, MicKey 14Fr 2.5cm    Gauze Pads & Dressings (SPLIT GAUZE DRAINAGE SPONGE) 4"X4" Use once and PRN times a  day as instructed.    gauze pads 2"X2" Use as directed around trach. MCAID ID [id number]    generic DME 10 isothermal adult "antiflect" connector. MCAID [id number] Use as directed.    Nebulizer MISC Disposable nebulizer kits to be used inline with trach  MCAID ID [id number]    generic DME Suction catheter with chimney valve.Use as directed.MCAID ID [id number]    albuterol (PROVENTIL) (2.5 mg/54m) 0.083% nebulizer solution Take 3 mLs (2.5 mg total) by nebulization 2 times daily Give twice daily with vesting    bisacodyl (DULCOLAX) 10 MG suppository Please give suppository as needed  if no BM in 3 days, please check with Dad prior to giving    clonazePAM (KLONOPIN) 1 MG tablet TAKE 1 CRUSHED TABLET VIA G-TUBE AS NEEDED FOR CLUSTER SEIZURES, MAX 3 TABS BACK TO BACK    GUAIFENESIN PO Take 200 mg by mouth every 4 hours as needed (For cough or congestion)     generic  DME Non-sterile gloves size medium Use as directed. MCAID ID [id number]/month    zinc oxide (DESITIN) 40 % paste Apply topically as needed for Diaper Rash    generic DME tRACH TIES WITH O2 PORT. Use as directed.MCAID ID [id number]   sodium chloride 0.9 % nebulizer solution Take 3 mLs by nebulization as needed for Wheezing MCAID ID [id number]   gauze sponge (CURITY) 4"X4" pads Use PRN as instructed.MCAID ID [id number]   generic DME PASSEY MUIR VALVE (PURPLE) Use as tolerated.MCAID ID [id number]   Adhesive Tape 1"x5yd TAPE By 1 Units no specified route as needed MCAID ID [id number]   generic DME 10 ml syringes Use as directed.MCAID ID [id number]   generic DME Trach care kits Use as directed.MCAID ID [id number]   surgical lubricant (SURGILUBE) gel Apply topically as needed MCAID ID [id number]   Distilled Water LIQD Distilled water, use as directedMCAID ID [id number]   generic DME Mepilex 4X 4. Use as directed.MCAID ID [id number]   generic DME HME for trach humidification Use as directed.MCAID ID [id number]   generic DME Cough Assist circuits Use as directed.MCAID ID [id number]   generic DME Suction tubing Use as directed.MCAID ID [id number]   generic DME Suction canister Use as directed.MCAID ID [id number]   generic DME Suction filter Use as directed.MCAID ID [id number]   generic DME Nasal adapters Use as directed.MCAID ID [id number]   generic DME Pulse ox probes (disposable)Use as directed.MCAID ID [id number]   diphenhydrAMINE (BENADRYL) 25 MG oral solid 25 mg by Per G Tube route nightly as needed for Sleep    FIBER SELECT GUMMIES PO Take by mouth Give 2 gummies daily    Multiple Vitamins-Minerals (HM MULTIVITAMIN ADULT GUMMY PO) Take by mouth Give 2 gummies daily    Ascorbic Acid (VITAMIN C ADULT GUMMIES PO) Take by mouth Give 2 gummies once daily    Cholecalciferol (VITAMIN D3) 50 MCG (2000 UT) capsule 2,000 Units by Per G Tube route daily    cetirizine (ZYRTEC) 1 MG/ML syrup 10 mg by Per G Tube route at  bedtime    Insulin Aspart (NOVOLOG FLEXPEN SC) Inject 4 Units into the skin 3 times daily (before meals) FOR BG > 200. Alert MD for BG <70 and >250.     oxygen 2 l/m bleed through ventilator at night    Blood Glucose Monitoring Suppl (FIFTY50 GLUCOSE METER 2.0) w/Device KIT by Other route    budesonide (  PULMICORT) 0.25 MG/2ML nebulizer solution Inhale 0.25 mg into the lungs 2 times daily     EPINEPHrine 0.15 mg/0.3 mL auto-injector Inject 0.15 mg into the muscle    insulin detemir (LEVEMIR FLEXPEN) 100 UNIT/ML injection pen Inject 65 Units into the skin at bedtime Give with bedtime snack    lactulose (LACTULOSE) 20 GM/30ML solution 10 g by Per G Tube route 3 times daily Hold dose for loose stools    levETIRAcetam (KEPPRA) 100 MG/ML solution GIVE 15 MLS BY PER G TUBE ROUTE 2 (TWO) TIMES DAILY.    miconazole (MICATIN) 2 % powder 2 times daily as needed     insulin pen needle (FIFTY50 PEN NEEDLES) 31G X 8 MM 1 each     Medications reviewed at today's visit    [place]  This visit was performed during a pandemic event, and the physical exam was limited to my Video observation of this patient's organ systems and/or body areas.   GEN:  Seen in bed, awake, alert no acute distress  Resp: no respiratory distress    Physical Exam      ASSESSMENT   Today we discussed the assessment and management of the following diagnosis:    ICD-10-CM ICD-9-CM   1. TBI (traumatic brain injury)  S06.9X9A 854.00   2. Spastic quadriplegia  G82.50 344.00   3. Type 2 diabetes mellitus with hyperglycemia, with long-term current use of insulin  E11.65 250.00    Z79.4 790.29     V58.67   4. Respirator dependence, status  Z99.11 V46.11   5. Acute respiratory failure  J96.00 518.81       PLAN      1. 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 TBI (traumatic brain injury)  underlying condition, all treatment  decisions made with this in consideration.     3. Type 2 diabetes mellitus with hyperglycemia, with long-term current use of insulin  Continue same insulin regimen, but will add 6 units if premeal BG is >250.  Continue levemir at same dose, suspect elevated BGs lately due to dietary indiscretion    4. Seizures - Dad thinks increased frequency due to increased screen time, he had been stable on current AED regimen. he has follow up with neuro  in February    Time:  Over 25 minutes were spent with the patient, >50% of the time was spent in counseling regarding the above problems.      21+  minutes was spent on the phone with the patient, patient representatives, and/or other attendees.     Return in about 3 months (around 10/01/2019) for Dr Shanda Howells.      Doretha Imus, MD

## 2019-07-03 NOTE — Patient Instructions (Addendum)
Would try and limit complex carb in the diet and we will monitor sugars closely rather than change his insulin regimen.    Will recheck labs in 3 months, We will [facility] prior to appt    We will send over new trach rx to [facility].

## 2019-07-03 NOTE — Telephone Encounter (Signed)
Writer faxed cuffed trach to [facility].  Received confirmation.

## 2019-07-09 ENCOUNTER — Other Ambulatory Visit: Payer: Self-pay | Admitting: Pediatrics

## 2019-07-09 DIAGNOSIS — G8250 Quadriplegia, unspecified: Secondary | ICD-10-CM

## 2019-07-09 MED ORDER — GENERIC DME *A*
5 refills | Status: DC
Start: 2019-07-09 — End: 2022-06-23

## 2019-07-09 NOTE — Telephone Encounter (Signed)
Writer spoke to staff member from [facility].  She has requested this gets faxed to [place].     Fax: [phone]

## 2019-07-10 ENCOUNTER — Encounter: Payer: Self-pay | Admitting: Gastroenterology

## 2019-07-10 ENCOUNTER — Other Ambulatory Visit: Payer: Self-pay | Admitting: Pediatrics

## 2019-07-10 DIAGNOSIS — E1165 Type 2 diabetes mellitus with hyperglycemia: Secondary | ICD-10-CM

## 2019-07-10 DIAGNOSIS — R569 Unspecified convulsions: Secondary | ICD-10-CM

## 2019-07-10 DIAGNOSIS — Z794 Long term (current) use of insulin: Secondary | ICD-10-CM

## 2019-07-10 DIAGNOSIS — K219 Gastro-esophageal reflux disease without esophagitis: Secondary | ICD-10-CM

## 2019-07-14 ENCOUNTER — Telehealth: Payer: Self-pay | Admitting: Pediatrics

## 2019-07-14 MED ORDER — SENNOSIDES 8.6 MG PO TABS *I*
1.0000 | ORAL_TABLET | Freq: Every day | ORAL | 6 refills | Status: DC
Start: 2019-07-14 — End: 2019-12-16

## 2019-07-14 NOTE — Addendum Note (Signed)
Addended by: Midge Minium on: 07/14/2019 01:29 PM     Modules accepted: Orders

## 2019-07-14 NOTE — Addendum Note (Signed)
Addended by: Blossom Hoops on: 07/14/2019 01:16 PM     Modules accepted: Orders

## 2019-07-14 NOTE — Telephone Encounter (Signed)
Rx faxed to [facility]   Confirmation received

## 2019-07-14 NOTE — Telephone Encounter (Signed)
[facility] TELEPHONE TRIAGE     Reason for call: Tonya requested that patients Senna RX be changed to pills in stead of liquid because the pill form is much less expensive.    Rquested a call back with confirmation    Name of caller:[name] for [facility]  Relationship to patient: Regulatory affairs officer (if applicable):   Phone:  [phone]  Fax (if applicable):

## 2019-07-15 ENCOUNTER — Telehealth: Payer: Self-pay | Admitting: Pediatrics

## 2019-07-15 NOTE — Telephone Encounter (Signed)
Incoming [facility] for [facility] to [facility]  Date Received: 07/15/2019    Type of paperwork (sender:form type): discharge summary for review    Completed paperwork to be sent to:  Fax to [phone]    Special Requests:     Placed in box for: [name]

## 2019-07-16 ENCOUNTER — Other Ambulatory Visit: Payer: Self-pay | Admitting: Pediatrics

## 2019-07-16 MED ORDER — FLUTICASONE PROPIONATE 50 MCG/ACT NA SUSP *I*
1.0000 | Freq: Every day | NASAL | 5 refills | Status: DC
Start: 2019-07-16 — End: 2019-09-10

## 2019-07-18 NOTE — Telephone Encounter (Signed)
Paperwork completed 07/18/2019  Sent as outlined in prior documentation

## 2019-07-24 ENCOUNTER — Telehealth: Payer: Self-pay | Admitting: Pediatrics

## 2019-07-24 NOTE — Telephone Encounter (Signed)
[facility] TELEPHONE TRIAGE     Reason for call:  [name] is calling to confirm we received RX from [facility] for [place] and supplies.    Requested call back to confirm it was recieved     Name of caller:[name] for  [name]  Relationship to patient: Regulatory affairs officer (if applicable):   Phone:  [phone]  Fax (if applicable):

## 2019-07-24 NOTE — Telephone Encounter (Signed)
Incoming paperwork for [facility] to the [facility]  Date Received: 07/24/2019    Type of paperwork (sender:form type): [facility] Discharge summary    Completed paperwork to be sent to:  Fax to [facility]  [phone]    Special Requests:     Placed in box for: [name]

## 2019-07-25 ENCOUNTER — Encounter: Payer: Self-pay | Admitting: Gastroenterology

## 2019-07-29 ENCOUNTER — Encounter: Payer: Self-pay | Admitting: Pediatrics

## 2019-07-30 ENCOUNTER — Telehealth: Payer: Self-pay | Admitting: Pediatrics

## 2019-07-30 NOTE — Telephone Encounter (Signed)
Dr [name] is scheduled to see Angel Wells on 3/31 and needs to reschedule due to a change in the provider on-call schedule. Can you please call and reschedule. Thank you

## 2019-08-04 ENCOUNTER — Telehealth: Payer: Self-pay | Admitting: Pediatrics

## 2019-08-04 DIAGNOSIS — Z794 Long term (current) use of insulin: Secondary | ICD-10-CM

## 2019-08-04 DIAGNOSIS — E1165 Type 2 diabetes mellitus with hyperglycemia: Secondary | ICD-10-CM

## 2019-08-04 DIAGNOSIS — J9611 Chronic respiratory failure with hypoxia: Secondary | ICD-10-CM

## 2019-08-04 DIAGNOSIS — Z9911 Dependence on respirator [ventilator] status: Secondary | ICD-10-CM

## 2019-08-04 DIAGNOSIS — J45909 Unspecified asthma, uncomplicated: Secondary | ICD-10-CM

## 2019-08-04 NOTE — Telephone Encounter (Signed)
Patient's RN Dorisann Frames reported the nebulizer patient owns is not currently working.  Requested RX for a new nebulizer be sent to [facility].     Please fax Rx to: [phone]

## 2019-08-04 NOTE — Telephone Encounter (Signed)
[facility] TELEPHONE TRIAGE     Reason for call: PDN Tonya requested to schedule an appointment with patient's PCP. Caller stated that the patient is still having issues with his blood sugar.   Tonya did not provide more information.   She provided her phone number for a return call.    Name of caller: [name] for [name]  Relationship to patient: Journalist, newspaper  Phone:  [phone]

## 2019-08-04 NOTE — Telephone Encounter (Signed)
Paperwork completed 08/04/2019  Sent as outlined in prior documentation

## 2019-08-05 MED ORDER — NEBULIZER/TUBING/MOUTHPIECE KIT *A*
PACK | 0 refills | Status: DC
Start: 2019-08-05 — End: 2022-06-23

## 2019-08-05 MED ORDER — INSULIN DETEMIR 100 UNIT/ML SC SOPN *I*
70.0000 [IU] | PEN_INJECTOR | Freq: Every evening | SUBCUTANEOUS | 5 refills | Status: DC
Start: 2019-08-05 — End: 2019-10-08

## 2019-08-05 NOTE — Telephone Encounter (Addendum)
Patient's RN [name] reported [facility] is requesting clinical notes also be faxed to them.     [name] also requested a script for a 74mm adapter for patient's trach.  Caller stated Rx will need a Dx code and include how many kits/week they need.   They are requesting 1/week.     Rx for nebulizer device with mask and tubing faxed to [facility]  Along with clinical notes  Fax: [phone]  Confirmation received

## 2019-08-05 NOTE — Addendum Note (Signed)
Addended by: Midge Minium on: 08/05/2019 01:24 PM     Modules accepted: Orders

## 2019-08-05 NOTE — Telephone Encounter (Signed)
Spoke with [name], states there are several things going on.    States she needs a script for josh's nebulizer machine sent to [facility] (current one sounds like its dying).    Reports that at last appt Dr [name] talked about blood sugars. Tonya and Dad, talked about switching levomir back to 70 from 65, or to lantus.  [name] states that BGs are running in 200's  This has been ongoing issue for quite some time now.  Reports he is getting novolog corrected which she hadn't had to do in the past and is wondering if this is now the case because levomir was decreased.    Currently no concern that he is ill.  Worried that he can't eat any carbs because BG skyrockets.  [name] says that Dad said whatever Dr [name] thought was best he would agree with in regards to BG management/control.    Checking BG TID, stated that checking this frequently was new and wasn't something they were doing in the past when she first started working with [employer].  AM, Lunch and Dinner BG's for last week as follows:  1/18- 221, 187, 221  1/19- 191, 219, 226  1/20- 211, 211, 198  1/21- 221, 211, 237  1/22-165, 231, 197  1/23- 239, 172, 271  1/24-243, missing, 176  1/25- 236, 179, 212  1/26- 252    Never used to give coverage and now with BGs staying in 200's, >200 - 4Units, >250- 6units. Having to give coverage consistently which she states never used to be the case.  Wondering about next steps and/or need for sooner follow up.    Will update PCP to above.

## 2019-08-05 NOTE — Telephone Encounter (Signed)
Please let [name] know that I would recommend increasing levemir to 70 units (when I suggested this previously, Dad stated it was likely dietary indiscretion so we had held off on making a change)

## 2019-08-05 NOTE — Telephone Encounter (Signed)
Rx documented in another encounter from today. Will close this one out

## 2019-08-07 ENCOUNTER — Telehealth: Payer: Self-pay | Admitting: Pediatrics

## 2019-08-07 NOTE — Telephone Encounter (Signed)
Im not exactly sure what they need. I have emailed [facility] to see if they need a script.

## 2019-08-07 NOTE — Telephone Encounter (Signed)
[facility] TELEPHONE TRIAGE     Reason for call: Day Nurse called and stated Angel Wells has  Increased  secreasions, from clear to yellow, wheezy at some times, Glucose was 329 before lunch, gave extra nebulizer treatment, had on O2 for extra hour.  Requested call back    Name of caller:[name] for Angel Wells  Relationship to patient: RN  Organization (if applicable):   Phone:  [phone]  Fax (if applicable):

## 2019-08-07 NOTE — Addendum Note (Signed)
Addended byAlvira Philips on: 08/07/2019 02:10 PM     Modules accepted: Orders

## 2019-08-07 NOTE — Telephone Encounter (Signed)
Spoke with day nurse, [name]  Reporting pre lunch BG at 329, gave appropriate coverage  Pt was having increased yellow secretions and wheezing, responded well to an albuterol neb and was on oxygen for a little over an hour  This afternoon he has been doing very well  Decided to book a video visit tomorrow morning just to be proactive

## 2019-08-08 ENCOUNTER — Ambulatory Visit: Payer: Medicare Other | Attending: Internal Medicine | Admitting: Internal Medicine

## 2019-08-08 DIAGNOSIS — S069X9A Unspecified intracranial injury with loss of consciousness of unspecified duration, initial encounter: Secondary | ICD-10-CM | POA: Insufficient documentation

## 2019-08-08 DIAGNOSIS — J398 Other specified diseases of upper respiratory tract: Secondary | ICD-10-CM | POA: Insufficient documentation

## 2019-08-08 DIAGNOSIS — S069XAA Unspecified intracranial injury with loss of consciousness status unknown, initial encounter: Secondary | ICD-10-CM

## 2019-08-08 DIAGNOSIS — Z794 Long term (current) use of insulin: Secondary | ICD-10-CM | POA: Insufficient documentation

## 2019-08-08 DIAGNOSIS — Z9911 Dependence on respirator [ventilator] status: Secondary | ICD-10-CM | POA: Insufficient documentation

## 2019-08-08 DIAGNOSIS — E1165 Type 2 diabetes mellitus with hyperglycemia: Secondary | ICD-10-CM | POA: Insufficient documentation

## 2019-08-08 NOTE — Patient Instructions (Addendum)
Increase vesting, saline nebs, albuterol nebs: TID through the weekend- follow up Monday for reassessment of symptoms     Blood sugars:  Monitor and track good data on BGs and meals: what he eats:  Time/BG/insulin/food   We will look at this on Monday and I'll talk with Dr. Britt Boozer machine - i'll [facility] a [facility]  Wheelchair assessment: schedule for an evaluation with Henreitta Cea- and order to [facility] wheelchair

## 2019-08-08 NOTE — Progress Notes (Signed)
[place] COMPLAINT     Chief Complaint   Patient presents with    Cough       Subjective     TELEMEDICINE CONSENT     Visit being conducted by Video in lieu of a face to face visit to minimize health care worker and patient exposure to COVID-19, and conserve PPE.    Location of Patient: home  Location of Telemedicine Provider: hospital / clinical location  Other Participants in telemedicine encounter and roles: father- Richardson Landry     Consent was obtained from the patient to complete this telemedicine visit; including the potential for financial liability.  How did the patient provide consent? Verbal Consent Only    SUBJECTIVE     Nurse called yesterday with Increased secretions and wheezing and states that he desat'd yesterday that occurred once in the morning yesterday but his day time nurse  who doesn't know him too well   Father has not noticed anything out of the ordinary  Secretions are mostly at baseline- slightly yellow but not increased  Denies fevers 97.5.     He had some wheezing in the morning yesterday and this is normal for him and he clears it well-   He was fatigued yesterday and sometimes he can   Morning: suction   Vent dependent overnight  Take him off the vent and does an albuterol treatment   Saline treatment with vest treatment   Suction  Budesonide neb   1pm- albuterol neb    Twice daily:  Albuterol   Saline with vest      BG  Slightly higher than normal over 300 yesterday- this is higher than normal   Nurse who knows him well   200s and sometimes above 250  levemir- decreased recently   Unsure if its what he is eating-       Wheelchair adjustments: needs this completed- and needs a new script and supplier arranged-   Nebulizer machine-  [facility]:       MEDICATIONS     Current Outpatient Medications   Medication Sig    generic DME 54m Adapter for trach to be used as directed. Replace weekly. Dx.Z99.11    generic DME 15  mm adapters for nebulized therapies via trach  Medicare ID [id number] Medicaid ID [id number]   insulin detemir (LEVEMIR FLEXPEN) 100 UNIT/ML injection pen Inject 70 units into the skin nightly    nebulizer device with mask and tubing Use as directed    fluticasone (FLONASE) 50 MCG/ACT nasal spray Spray 1 spray into nostril daily    senna (SENOKOT) 8.6 MG tablet Administer 1 tablet via G tube daily Crush 1 tablet via G-tube daily.  Hold for loose stools.    B-D UF III MINI PEN NEEDLES 31G X 5 MM USE 3 (THREE) TIMES DAILY AS NEEDED.    famotidine (PEPCID) 20 MG tablet TAKE 1 TABLET BY MOUTH 2 (TWO) TIMES DAILY. OK TO CRUSH IT AND PUT IT DOWN TUBE IF NECESSARY    VALPROIC ACID 250 MG/5ML solution TAKE 5 MLS BY MOUTH 3 (THREE) TIMES DAILY    generic DME Please dispense one 556msyringe. Use as directed.    generic DME MCAID ID [id number] CUFFED TRACH 7.0 Please change trach every month and as needed.    triamcinolone (KENALOG) 0.025 % cream APPLY TO AFFECTED AREA TWICE A DAY    Accu-Chek FastClix  Lancets MISC 1 EACH BY MISC.(NON-DRUG COMBO ROUTE) ROUTE 3 (THREE) TIMES DAILY. E11.65    blood glucose (ACCU-CHEK AVIVA PLUS) test strip USE 1 STRIP 3 (THREE) TIMES DAILY and  AS NEEDED.Dx: E11.65 and Z79.4    ipratropium (ATROVENT) 0.02 % nebulizer solution Inhale 2.5 mLs (500 mcg total) by nebulization 2 times daily    baclofen (LIORESAL) 10 MG tablet TAKE 1 TABLET BY MOUTH TWICE A DAY    generic DME Please dispense 64m measuring cup. Use as directed.    lactobacillus rhamnosus, GG, (CULTURELLE) capsule Take 1 capsule (1 each total) by mouth daily    Feeding Tubes - Sets (MIC-KEY GASTROSTOMY KIT 14FR) MISC Please dispense every 6 months and prn, MicKey 14Fr 2.5cm    Gauze Pads & Dressings (SPLIT GAUZE DRAINAGE SPONGE) 4"X4" Use once and PRN times a day as instructed.    gauze pads 2"X2" Use as directed around trach. MCAID ID [id number]   generic DME 10 isothermal adult "antiflect" connector. MCAID  [id number] as directed.    Nebulizer MISC Disposable nebulizer kits to be used inline with trach  MCAID ID [id number]   generic DME Suction catheter with chimney valve.Use as directed.MCAID ID [id number]   albuterol (PROVENTIL) (2.5 mg/366m 0.083% nebulizer solution Take 3 mLs (2.5 mg total) by nebulization 2 times daily Give twice daily with vesting    bisacodyl (DULCOLAX) 10 MG suppository Please give suppository as needed  if no BM in 3 days, please check with Dad prior to giving    clonazePAM (KLONOPIN) 1 MG tablet TAKE 1 CRUSHED TABLET VIA G-TUBE AS NEEDED FOR CLUSTER SEIZURES, MAX 3 TABS BACK TO BACK    GUAIFENESIN PO Take 200 mg by mouth every 4 hours as needed (For cough or congestion)     generic DME Non-sterile gloves size medium Use as directed. MCAID ID [id number]/month    zinc oxide (DESITIN) 40 % paste Apply topically as needed for Diaper Rash    generic DME tRACH TIES WITH O2 PORT. Use as directed.MCAID ID [id number]  sodium chloride 0.9 % nebulizer solution Take 3 mLs by nebulization as needed for Wheezing MCAID ID [id number]  gauze sponge (CURITY) 4"X4" pads Use PRN as instructed.MCAID ID [id number]  generic DME PASSEY MUIR VALVE (PURPLE) Use as tolerated.MCAID ID [id number]  Adhesive Tape 1"x5yd TAPE By 1 Units no specified route as needed MCAID ID [id number]  generic DME 10 ml syringes Use as directed.MCAID ID [id number]  generic DME Trach care kits Use as directed.MCAID ID [id number]  surgical lubricant (SURGILUBE) gel Apply topically as needed MCAID ID [id number]  Distilled Water LIQD Distilled water, use as directedMCAID ID [id number]  generic DME Mepilex 4X 4. Use as directed.MCAID ID [id number]  generic DME HME for trach humidification Use as directed.MCAID ID [id number]  generic DME Cough Assist circuits Use as directed.MCAID ID [id number]  generic DME Suction tubing Use as directed.MCAID ID [id number]  generic DME Suction canister Use as directed.MCAID ID [id number]   generic DME Suction filter Use as directed.MCAID ID [id number]  generic DME Nasal adapters Use as directed.MCAID ID [id number]  generic DME Pulse ox probes (disposable)Use as directed.MCAID ID [id number]  diphenhydrAMINE (BENADRYL) 25 MG oral solid 25 mg by Per G Tube route nightly as needed for Sleep  FIBER SELECT GUMMIES PO Take by mouth Give 2 gummies daily    Multiple Vitamins-Minerals (HM MULTIVITAMIN ADULT GUMMY PO) Take by mouth Give 2 gummies daily    Ascorbic Acid (VITAMIN C ADULT GUMMIES PO) Take by mouth Give 2 gummies once daily    Cholecalciferol (VITAMIN D3) 50 MCG (2000 UT) capsule 2,000 Units by Per G Tube route daily    cetirizine (ZYRTEC) 1 MG/ML syrup 10 mg by Per G Tube route at bedtime    Insulin Aspart (NOVOLOG FLEXPEN SC) Inject 4 Units into the skin 3 times daily (before meals) FOR BG > 200. Alert MD for BG <70 and >250.     oxygen 2 l/m bleed through ventilator at night    Blood Glucose Monitoring Suppl (FIFTY50 GLUCOSE METER 2.0) w/Device KIT by Other route    budesonide (PULMICORT) 0.25 MG/2ML nebulizer solution Inhale 0.25 mg into the lungs 2 times daily     EPINEPHrine 0.15 mg/0.3 mL auto-injector Inject 0.15 mg into the muscle    lactulose (LACTULOSE) 20 GM/30ML solution 10 g by Per G Tube route 3 times daily Hold dose for loose stools    levETIRAcetam (KEPPRA) 100 MG/ML solution GIVE 15 MLS BY PER G TUBE ROUTE 2 (TWO) TIMES DAILY.    miconazole (MICATIN) 2 % powder 2 times daily as needed     insulin pen needle (FIFTY50 PEN NEEDLES) 31G X 8 MM 1 each     Medications reviewed at today's visit       Objective     OBJECTIVE   PHYSICAL EXAM  This visit was performed during a pandemic event, and the physical exam was limited to my Video observation of this patient's organ systems and/or body areas.   GEN:  No apparent distress, comfortbale. No SOB or respiratory distress    Physical Exam         ASSESSMENT   Today we discussed the assessment and management of the  following diagnosis:    ICD-10-CM ICD-9-CM   1. Increased tracheal secretions  J39.8 519.19   2. Ventilator dependence  Z99.11 V46.11   3. TBI (traumatic brain injury)  S06.9X9A 854.00   4. Type 2 diabetes mellitus with hyperglycemia, with long-term current use of insulin  E11.65 250.00    Z79.4 790.29     V58.67       PLAN      1. Increased tracheal secretions  Symptoms are baseline according to patient's primary nurse who cares for him on a regular basis.   Plan to increase vesting and nebulizations to 3 times a day throughout the weekend and will follow up on Monday for reassessment of symptoms to assure that he is not developing an illness    2. Ventilator dependence  Underlying condition, all treatment decisions made with this in consideration   No reported increase ventilator needs at this time    3. TBI (traumatic brain injury)  Underlying condition, all treatment decisions made with this in consideration       4. Type 2 diabetes mellitus with hyperglycemia, with long-term current use of insulin  Patient has been having increased blood sugars over the last few months-this is not new originally thought to be related to holiday meals however diet is back to normal and he is still having elevated blood sugars  -We will have them keep track of blood sugars how much insulin was given what time it was at home what he ate during that time in order to get  a better idea of what exactly is happening-I will discuss with PCP on follow-up plan and any changes that she wants to make with insulin         Orders Placed This Encounter   No orders placed during this encounter.       Patient Instructions   Increase vesting, saline nebs, albuterol nebs: TID through the weekend- follow up Monday for reassessment of symptoms     Blood sugars:  Monitor and track good data on BGs and meals: what he eats:  Time/BG/insulin/food   We will look at this on Monday and I'll talk with Dr. Redmond Pulling machine - i'll [facility]  Wheelchair assessment: schedule for an evaluation with Wilhemina Cash- and order to [facility] wheelchair                Barbaraann Boys, NP

## 2019-08-11 ENCOUNTER — Ambulatory Visit: Payer: Medicare Other | Admitting: Rehabilitative and Restorative Service Providers"

## 2019-08-11 ENCOUNTER — Ambulatory Visit: Payer: Medicare Other | Attending: Internal Medicine | Admitting: Internal Medicine

## 2019-08-11 ENCOUNTER — Telehealth: Payer: Self-pay | Admitting: Pediatrics

## 2019-08-11 DIAGNOSIS — E1165 Type 2 diabetes mellitus with hyperglycemia: Secondary | ICD-10-CM | POA: Insufficient documentation

## 2019-08-11 DIAGNOSIS — G40909 Epilepsy, unspecified, not intractable, without status epilepticus: Secondary | ICD-10-CM | POA: Insufficient documentation

## 2019-08-11 DIAGNOSIS — Z794 Long term (current) use of insulin: Secondary | ICD-10-CM | POA: Insufficient documentation

## 2019-08-11 DIAGNOSIS — G8250 Quadriplegia, unspecified: Secondary | ICD-10-CM | POA: Insufficient documentation

## 2019-08-11 DIAGNOSIS — J398 Other specified diseases of upper respiratory tract: Secondary | ICD-10-CM | POA: Insufficient documentation

## 2019-08-11 DIAGNOSIS — S069X9A Unspecified intracranial injury with loss of consciousness of unspecified duration, initial encounter: Secondary | ICD-10-CM | POA: Insufficient documentation

## 2019-08-11 DIAGNOSIS — S069XAA Unspecified intracranial injury with loss of consciousness status unknown, initial encounter: Secondary | ICD-10-CM

## 2019-08-11 NOTE — Patient Instructions (Addendum)
I will talk with Dr. [name] about his current Bgs and will call with changes we decide on for his insulin regimen    Respiratory: sounds like he is doing well. You may return back to his previous regimen of nebs/treatments. If he shows any other s/s of worsening then call us and i'll order a chest xray and sputum culture.
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
## 2019-08-11 NOTE — Progress Notes (Signed)
[facility]    TELEMEDICINE VISIT  CHIEF COMPLAINT     No chief complaint on file.      Subjective     TELEMEDICINE CONSENT     Visit being conducted by Video in lieu of a face to face visit to minimize health care worker and patient exposure to COVID-19, and conserve PPE.    Location of Patient: home  Location of Telemedicine Provider: hospital / clinical location  Other Participants in telemedicine encounter and roles: home nurse- caregiver     Consent was obtained from the patient to complete this telemedicine visit; including the potential for financial liability.  How did the patient provide consent? Verbal Consent Only    SUBJECTIVE     Secretions:   Improved since last week- no concerns with this- slightly yellow in the morning but this can be normal and then it is clear/white the rest of the day  Afebrile, no increase in oxygen needs or increased vent dependency. Oxygen levels are normal: anywhere from 93-95% and if this decreases then it typically improves with suctioning and this is normal  Denies SOB, cough  Lungs sound clear but with some rhonchi which is normal for him  They increased nebs and vesting over the weekend and this seems to have helped clear him and get him back to baseline.       Diabetes:  Has been consistently having higher BGs than his usual- they thouhght this was mostly related to the Holidays and such but this has continued and his diet is abck to his normal types of food  -needing novolog no a more regular basis and so she noticed a difference   No s/s of illness  Denies any new foods/changes in   Denies any increased in urination, denies vision changes     BGs:   Monitored closely over the weekend. They also kept a food log of what he ate with each meal.   Not currently checking nightly BGs - PCP had them do this for a little while but then told them that they oculd stop  Ishmeal is not on any steroids and has not had any other changes in medications recently  other than his inhaled meds-     Friday:  breakfast- 169  - novolog: none   Noon- 257  -novolog: 6units   Dinner: 221  -novolog: 4units   Night time levemir- he gets 65units before bedtime    Saturday:  BK: 226- 4 unit given  Lunch: 256 - 6units   Dinner: 235- 4 units  Night- leemir0 65units     Sunday:   BK: 288-  6 units  Lunch: 235 - 4 untis   Dinner: 231- 4 units   Nighttime: 65units       Seizure activity:  Slightly elevated but they have an upcoming appointment with Neurology  No documented misses in his meds-  No concern for infection       MEDICATIONS     Current Outpatient Medications   Medication Sig    insulin detemir (LEVEMIR FLEXPEN) 100 UNIT/ML injection pen Inject 70 units into the skin nightly    nebulizer device with mask and tubing Use as directed    fluticasone (FLONASE) 50 MCG/ACT nasal spray Spray 1 spray into nostril daily    senna (SENOKOT) 8.6 MG tablet Administer 1 tablet via G tube daily Crush 1 tablet via G-tube daily.  Hold for loose stools.    B-D UF III  MINI PEN NEEDLES 31G X 5 MM USE 3 (THREE) TIMES DAILY AS NEEDED.    famotidine (PEPCID) 20 MG tablet TAKE 1 TABLET BY MOUTH 2 (TWO) TIMES DAILY. OK TO CRUSH IT AND PUT IT DOWN TUBE IF NECESSARY    VALPROIC ACID 250 MG/5ML solution TAKE 5 MLS BY MOUTH 3 (THREE) TIMES DAILY    generic DME Please dispense one 52m syringe. Use as directed.    generic DME MCAID ID [id number] BIVONA CUFFED TRACH 7.0 Please change trach every month and as needed.    triamcinolone (KENALOG) 0.025 % cream APPLY TO AFFECTED AREA TWICE A DAY    Accu-Chek FastClix Lancets MISC 1 EACH BY MISC.(NON-DRUG COMBO ROUTE) ROUTE 3 (THREE) TIMES DAILY. E11.65    blood glucose (ACCU-CHEK AVIVA PLUS) test strip USE 1 STRIP 3 (THREE) TIMES DAILY and  AS NEEDED.Dx: E11.65 and Z79.4    ipratropium (ATROVENT) 0.02 % nebulizer solution Inhale 2.5 mLs (500 mcg total) by nebulization 2 times daily    baclofen (LIORESAL) 10 MG tablet TAKE 1 TABLET BY MOUTH TWICE A DAY     generic DME Please dispense 342mmeasuring cup. Use as directed.    lactobacillus rhamnosus, GG, (CULTURELLE) capsule Take 1 capsule (1 each total) by mouth daily    Feeding Tubes - Sets (MIC-KEY GASTROSTOMY KIT 14FR) MISC Please dispense every 6 months and prn, MicKey 14Fr 2.5cm    Gauze Pads & Dressings (SPLIT GAUZE DRAINAGE SPONGE) 4"X4" Use once and PRN times a day as instructed.    gauze pads 2"X2" Use as directed around trach. MCAID ID [id number]  generic DME 10 isothermal adult "antiflect" connector. MCAID [id number] as directed.    Nebulizer MISC Disposable nebulizer kits to be used inline with trach  MCAID ID [id number]  generic DME Suction catheter with chimney valve.Use as directed.MCAID ID [id number]  albuterol (PROVENTIL) (2.5 mg/45m31m0.083% nebulizer solution Take 3 mLs (2.5 mg total) by nebulization 2 times daily Give twice daily with vesting    bisacodyl (DULCOLAX) 10 MG suppository Please give suppository as needed  if no BM in 3 days, please check with Dad prior to giving    clonazePAM (KLONOPIN) 1 MG tablet TAKE 1 CRUSHED TABLET VIA G-TUBE AS NEEDED FOR CLUSTER SEIZURES, MAX 3 TABS BACK TO BACK    GUAIFENESIN PO Take 200 mg by mouth every 4 hours as needed (For cough or congestion)     generic DME Non-sterile gloves size medium Use as directed. MCAID ID [id number]/month    zinc oxide (DESITIN) 40 % paste Apply topically as needed for Diaper Rash    generic DME tRACH TIES WITH O2 PORT. Use as directed.MCAID ID [id number] sodium chloride 0.9 % nebulizer solution Take 3 mLs by nebulization as needed for Wheezing MCAID ID [id number] gauze sponge (CURITY) 4"X4" pads Use PRN as instructed.MCAID ID [id number] generic DME PASSEY MUIR VALVE (PURPLE) Use as tolerated.MCAID ID [id number] Adhesive Tape 1"x5yd TAPE By 1 Units no specified route as needed MCAID ID [id number] generic DME 10 ml syringes Use as directed.MCAID ID [id number] generic DME Trach care kits Use as  directed.MCAID ID [id number] surgical lubricant (SURGILUBE) gel Apply topically as needed MCAID ID [id number] Distilled Water LIQD Distilled water, use as directedMCAID ID [id number] generic DME Mepilex 4X 4. Use as directed.MCAID ID [id number]  generic DME HME for trach humidification Use as directed.MCAID ID [id number]    generic DME Cough Assist circuits Use as directed.MCAID ID [id number]    generic DME Suction tubing Use as directed.MCAID ID [id number]    generic DME Suction canister Use as directed.MCAID ID [id number]    generic DME Suction filter Use as directed.MCAID ID [id number]    generic DME Nasal adapters Use as directed.MCAID ID [id number]    generic DME Pulse ox probes (disposable)Use as directed.MCAID ID [id number]    diphenhydrAMINE (BENADRYL) 25 MG oral solid 25 mg by Per G Tube route nightly as needed for Sleep    FIBER SELECT GUMMIES PO Take by mouth Give 2 gummies daily    Multiple Vitamins-Minerals (HM MULTIVITAMIN ADULT GUMMY PO) Take by mouth Give 2 gummies daily    Ascorbic Acid (VITAMIN C ADULT GUMMIES PO) Take by mouth Give 2 gummies once daily    Cholecalciferol (VITAMIN D3) 50 MCG (2000 UT) capsule 2,000 Units by Per G Tube route daily    cetirizine (ZYRTEC) 1 MG/ML syrup 10 mg by Per G Tube route at bedtime    Insulin Aspart (NOVOLOG FLEXPEN SC) Inject 4 Units into the skin 3 times daily (before meals) FOR BG > 200. Alert MD for BG <70 and >250.     oxygen 2 l/m bleed through ventilator at night    Blood Glucose Monitoring Suppl (FIFTY50 GLUCOSE METER 2.0) w/Device KIT by Other route    budesonide (PULMICORT) 0.25 MG/2ML nebulizer solution Inhale 0.25 mg into the lungs 2 times daily     EPINEPHrine 0.15 mg/0.3 mL auto-injector Inject 0.15 mg into the muscle    lactulose (LACTULOSE) 20 GM/30ML solution 10 g by Per G Tube route 3 times daily Hold dose for loose stools    levETIRAcetam (KEPPRA) 100 MG/ML solution GIVE 15 MLS BY PER G TUBE ROUTE 2 (TWO) TIMES DAILY.     miconazole (MICATIN) 2 % powder 2 times daily as needed     insulin pen needle (FIFTY50 PEN NEEDLES) 31G X 8 MM 1 each     Medications reviewed at today's visit       Objective     OBJECTIVE   PHYSICAL EXAM  This visit was performed during a pandemic event, and the physical exam was limited to my Video observation of this patient's organ systems and/or body areas.   GEN: awake, alert. Laying in bed.   Pulm: no respiratory distress             ASSESSMENT   Today we discussed the assessment and management of the following diagnosis:    ICD-10-CM ICD-9-CM   1. Increased tracheal secretions  J39.8 519.19   2. Type 2 diabetes mellitus with hyperglycemia, with long-term current use of insulin  E11.65 250.00    Z79.4 790.29     V58.67   3. TBI (traumatic brain injury)  S06.9X9A 854.00   4. Spastic quadriplegia  G82.50 344.00   5. Seizure disorder  G40.909 345.90       PLAN      1. Increased tracheal secretions  Symptoms improved with increase in nebs/vesting over the weekend. No other s/s of infection. Daichi remains afebrile without cough need for increased oxygen or vent time.   -continue to monitor, educated/instructed to call with any concerns or worsening symptoms- if any changes occur- would check chest xray and tracheal sputum culture.   -return to normal schedule of nebs/vesting: BID and PRN    2. Type  2 diabetes mellitus with hyperglycemia, with long-term current use of insulin  Galdino continues to have elevated Bgs evn since the Holidays have passed- which is what they attributed his elevated BGs to in the past. No other s/s of infection. Diet has returned to his typical diet and his nurse has noticed that he is consistently needing insulin with his meal and BGs have been in the 200's more often than before. Last HgA1C was 6.4 in Dec. 2020.   -continue novolog and levemir as prescribed- will discuss with PCP on changes she would like to make to his insulin regimen and I will call them with these changes and have  them follow up about 1-2 weeks after  -could consider dietician appointment to evaluate Darry's diet to see if any changes should be made there.       3. TBI (traumatic brain injury)  Underlying condition, all treatment decisions made with this in consideration      4. Spastic quadriplegia  Underlying condition, all treatment decisions made with this in consideration      5. Seizure disorder  Stable  continue AEDs as prescribed  Has follow up with Neurology          Orders Placed This Encounter   No orders placed during this encounter.       Patient Instructions   I will talk with Dr. Shanda Howells about his current Bgs and will call with changes we decide on for his insulin regimen    Respiratory: sounds like he is doing well. You may return back to his previous regimen of nebs/treatments. If he shows any other s/s of worsening then call us and i'll order a chest xray and sputum culture.                 No follow-ups on file.      Barbaraann Boys, NP

## 2019-08-11 NOTE — Telephone Encounter (Signed)
[facility] TELEPHONE TRIAGE     Reason for call: RN, [name] called and stated they are unable to come in for Angel Wells's wheenchair assessment.  This is a wheelchair he uses at home and does not transport in.  This evaluation will need to be postponed and done by [facility] PT once they are back to doing home visits.  Any questions please call [name]       Name of caller: Cristi Loron Lanter  Relationship to patient: Regulatory affairs officer (if applicable):   Phone:  [phone]  Fax (if applicable):

## 2019-08-12 ENCOUNTER — Telehealth: Payer: Self-pay

## 2019-08-12 DIAGNOSIS — Z9911 Dependence on respirator [ventilator] status: Secondary | ICD-10-CM

## 2019-08-12 DIAGNOSIS — Z930 Tracheostomy status: Secondary | ICD-10-CM

## 2019-08-12 MED ORDER — GENERIC DME *A*
11 refills | Status: DC
Start: 2019-08-12 — End: 2022-06-23

## 2019-08-12 MED ORDER — GENERIC DME *A*
5 refills | Status: DC
Start: 2019-08-12 — End: 2022-06-23

## 2019-08-12 NOTE — Telephone Encounter (Signed)
Adapters needed

## 2019-08-12 NOTE — Addendum Note (Signed)
Addended by: Midge Minium on: 08/12/2019 12:27 PM     Modules accepted: Orders

## 2019-08-12 NOTE — Telephone Encounter (Signed)
Writer faxed RX to [facility] [phone]

## 2019-08-18 ENCOUNTER — Telehealth: Payer: Self-pay

## 2019-08-18 ENCOUNTER — Telehealth: Payer: Self-pay | Admitting: Pediatrics

## 2019-08-18 DIAGNOSIS — J45909 Unspecified asthma, uncomplicated: Secondary | ICD-10-CM

## 2019-08-18 MED ORDER — PARI LC PLUS VIOS PRO NEB MISC
0 refills | Status: DC
Start: 2019-08-18 — End: 2019-08-27

## 2019-08-18 NOTE — Telephone Encounter (Signed)
See prior note for clarification.

## 2019-08-18 NOTE — Telephone Encounter (Signed)
When I spoke to Angel Wells earlier I told her I had a backup portable suction machine available that was donated by another patient that they could have and they have decided they would like to have it as a backup in case Angel Wells's machine malfunctions. They will come to the office Wednesday or Thursday to pick it up.

## 2019-08-18 NOTE — Telephone Encounter (Signed)
Spoke to Angel Wells, Angel Wells's primary suction came with him from his last residence and has a short in it but cannot be serviced because it was not obtained locally.(vendor will not fix it if it was not issued through them) Angel Wells supplied a portable suction and insurance will not fill a new prescription because it hasn't been long enough. They want a sturdier suction machine than the portable but understand insurance won't cover this at this time. They do need a new nebulizer machine and this prescription should be sent to Angel Wells. (pended) Angel Wells states that she doesn't know why someone from Angel Wells faxed the office as she already knows the outcome.   When Angel Wells tried to reorder Angel Wells's g-tube supplies she was told Angel Wells will not cover (due to the fact he eats by mouth, tube is for meds and hydration) They had been supplying up until this refill request. Angel Wells believes that if Angel Wells denies, it may be able to be submitted through Angel Wells but we need to discuss the process with Angel Wells as she has gone through this process with them prior to this.

## 2019-08-18 NOTE — Telephone Encounter (Signed)
Writer spoke with [place]. She states she was speaking with RT and it was discussed there might be an extra suction machine for patient. Dorisann Frames spoke with her boss and they are under agreement that patient could use this machine. Dorisann Frames has requested a call back.     Phone: [phone]

## 2019-08-18 NOTE — Telephone Encounter (Signed)
Fax came in from [facility] requesting a new suction machine and new nebulizer, can you touch base with family to get more details and then i'm happy to sign rx, just want to be sure i'm ordering the right equipment.  Also it says that [employer] turned down the mickey button and supplies but not sure what mcare is? (it's handwritten so might be something else) let me know and I can work on getting that done too.

## 2019-08-19 NOTE — Telephone Encounter (Signed)
Script has been faxed over

## 2019-08-20 ENCOUNTER — Ambulatory Visit: Payer: Medicare Other | Admitting: Neurology

## 2019-08-20 NOTE — Progress Notes (Unsigned)
NO SHOW after 23 min.

## 2019-08-20 NOTE — Telephone Encounter (Signed)
Tonia RN requested a return call from Lupita Leash when available to help setup the suction machine.   Dorisann Frames reported she thinks she is missing a plug.  Phone: [phone]

## 2019-08-20 NOTE — Telephone Encounter (Signed)
Called back, I have the other piece of the plug here so she will pick it up.

## 2019-08-21 ENCOUNTER — Encounter: Payer: Self-pay | Admitting: Neurology

## 2019-08-21 NOTE — Telephone Encounter (Signed)
Writer rescheduled follow up appointment for 10/23/19 at 4pm

## 2019-08-22 ENCOUNTER — Encounter: Payer: Self-pay | Admitting: Pediatrics

## 2019-08-22 ENCOUNTER — Ambulatory Visit: Payer: Medicare Other | Attending: Pediatrics | Admitting: Pediatrics

## 2019-08-22 VITALS — BP 135/86 | HR 85 | Temp 97.7°F | Resp 20

## 2019-08-22 DIAGNOSIS — S069X9A Unspecified intracranial injury with loss of consciousness of unspecified duration, initial encounter: Secondary | ICD-10-CM | POA: Insufficient documentation

## 2019-08-22 DIAGNOSIS — G8250 Quadriplegia, unspecified: Secondary | ICD-10-CM | POA: Insufficient documentation

## 2019-08-22 DIAGNOSIS — Z9911 Dependence on respirator [ventilator] status: Secondary | ICD-10-CM | POA: Insufficient documentation

## 2019-08-22 DIAGNOSIS — E1165 Type 2 diabetes mellitus with hyperglycemia: Secondary | ICD-10-CM | POA: Insufficient documentation

## 2019-08-22 DIAGNOSIS — Z794 Long term (current) use of insulin: Secondary | ICD-10-CM | POA: Insufficient documentation

## 2019-08-22 DIAGNOSIS — S069XAA Unspecified intracranial injury with loss of consciousness status unknown, initial encounter: Secondary | ICD-10-CM

## 2019-08-22 NOTE — Patient Instructions (Addendum)
Increase levemir to 70 units, call us in a week with his BG levels, goal is to have his morning sugars in 150s.     Keep appt in April as scheduled    We will look into gtube supplies again to see if we can get icircle to cover if medicare has denied

## 2019-08-22 NOTE — Progress Notes (Signed)
[facility]    TELEMEDICINE VISIT  CHIEF COMPLAINT     Chief Complaint   Patient presents with    Diabetes       Subjective     TELEMEDICINE CONSENT     Visit being conducted by Video in lieu of a face to face visit to minimize health care worker and patient exposure to COVID-19, and conserve PPE.    Location of Patient: home  Location of Telemedicine Provider: hospital / clinical location  Other Participants in telemedicine encounter and roles: home nurse- Tonia    Consent was obtained from the patient to complete this telemedicine visit; including the potential for financial liability.  How did the patient provide consent? Verbal Consent Only    SUBJECTIVE     Ventilator Dependence at night - does vesting twice daily, O2 have been good, got a new trilogy vent   Picked up the suction machine    SIMV/PC-21/PS-13,rate10vT 310PEEP 5- nocturnal and PRN use with 1 LPM O2 bleed  Bivona cuffed terach 7.0    Diabetes:  They are monitoring his diet. No s/s of illness  Denies any new foods/changes in   Denies any increased in urination, denies vision changes   BGs in 200s first time in the morning  prelunch - 240-271  predinner - 197-303    Bowel movements - on bowel regimen with senna/lactulose, gets 219m 8 times a day per tube, also takes by mouth but typically sips    Seizures - having them randomly, but not more frequently than before    [facility] still awaiting adjustments to his WC  MEDICATIONS     Current Outpatient Medications   Medication Sig    Nebulizers (PARI LC PLUS VIOS PRO NEB) MISC Pari Vios Pro Neb or comparable . Use as directed.    generic DME 136mAdapter for trach to be used as directed. Replace weekly. Dx.Z99.11    generic DME 15 mm adapters for nebulized therapies via trach  Medicare ID [id number] ID [id number]  insulin detemir (LEVEMIR FLEXPEN) 100 UNIT/ML injection pen Inject 70 units into the skin nightly    nebulizer device with mask and  tubing Use as directed    fluticasone (FLONASE) 50 MCG/ACT nasal spray Spray 1 spray into nostril daily    senna (SENOKOT) 8.6 MG tablet Administer 1 tablet via G tube daily Crush 1 tablet via G-tube daily.  Hold for loose stools.    B-D UF III MINI PEN NEEDLES 31G X 5 MM USE 3 (THREE) TIMES DAILY AS NEEDED.    famotidine (PEPCID) 20 MG tablet TAKE 1 TABLET BY MOUTH 2 (TWO) TIMES DAILY. OK TO CRUSH IT AND PUT IT DOWN TUBE IF NECESSARY    VALPROIC ACID 250 MG/5ML solution TAKE 5 MLS BY MOUTH 3 (THREE) TIMES DAILY    generic DME Please dispense one 5025myringe. Use as directed.    generic DME MCAID ID [id number] CUFFED TRACH 7.0 Please change trach every month and as needed.    triamcinolone (KENALOG) 0.025 % cream APPLY TO AFFECTED AREA TWICE A DAY    Accu-Chek FastClix Lancets MISC 1 EACH BY MISC.(NON-DRUG COMBO ROUTE) ROUTE 3 (THREE) TIMES DAILY. E11.65    blood glucose (ACCU-CHEK AVIVA PLUS) test strip USE 1 STRIP 3 (THREE) TIMES DAILY and  AS NEEDED.Dx: E11.65 and Z79.4    ipratropium (ATROVENT) 0.02 % nebulizer solution Inhale 2.5 mLs (500 mcg total) by nebulization  2 times daily    baclofen (LIORESAL) 10 MG tablet TAKE 1 TABLET BY MOUTH TWICE A DAY    generic DME Please dispense 22m measuring cup. Use as directed.    lactobacillus rhamnosus, GG, (CULTURELLE) capsule Take 1 capsule (1 each total) by mouth daily    Feeding Tubes - Sets (MIC-KEY GASTROSTOMY KIT 14FR) MISC Please dispense every 6 months and prn, MicKey 14Fr 2.5cm    Gauze Pads & Dressings (SPLIT GAUZE DRAINAGE SPONGE) 4"X4" Use once and PRN times a day as instructed.    gauze pads 2"X2" Use as directed around trach. MCAID ID [id number]   generic DME 10 isothermal adult "antiflect" connector. MCAID [id number] as directed.    Nebulizer MISC Disposable nebulizer kits to be used inline with trach  MCAID ID [id number]   generic DME Suction catheter with chimney valve.Use as directed.MCAID ID [id number]   albuterol (PROVENTIL)  (2.5 mg/353m 0.083% nebulizer solution Take 3 mLs (2.5 mg total) by nebulization 2 times daily Give twice daily with vesting    bisacodyl (DULCOLAX) 10 MG suppository Please give suppository as needed  if no BM in 3 days, please check with Dad prior to giving    clonazePAM (KLONOPIN) 1 MG tablet TAKE 1 CRUSHED TABLET VIA G-TUBE AS NEEDED FOR CLUSTER SEIZURES, MAX 3 TABS BACK TO BACK    GUAIFENESIN PO Take 200 mg by mouth every 4 hours as needed (For cough or congestion)     generic DME Non-sterile gloves size medium Use as directed. MCAID ID [id number]/month    zinc oxide (DESITIN) 40 % paste Apply topically as needed for Diaper Rash    generic DME tRACH TIES WITH O2 PORT. Use as directed.MCAID ID [id number]  sodium chloride 0.9 % nebulizer solution Take 3 mLs by nebulization as needed for Wheezing MCAID ID [id number]  gauze sponge (CURITY) 4"X4" pads Use PRN as instructed.MCAID ID [id number]  generic DME PASSEY MUIR VALVE (PURPLE) Use as tolerated.MCAID ID [id number]  Adhesive Tape 1"x5yd TAPE By 1 Units no specified route as needed MCAID ID [id number]  generic DME 10 ml syringes Use as directed.MCAID ID [id number]  generic DME Trach care kits Use as directed.MCAID ID [id number]  surgical lubricant (SURGILUBE) gel Apply topically as needed MCAID ID [id number]  Distilled Water LIQD Distilled water, use as directedMCAID ID [id number]  generic DME Mepilex 4X 4. Use as directed.MCAID ID [id number]  generic DME HME for trach humidification Use as directed.MCAID ID [id number]  generic DME Cough Assist circuits Use as directed.MCAID ID [id number]  generic DME Suction tubing Use as directed.MCAID ID [id number]  generic DME Suction canister Use as directed.MCAID ID [id number]  generic DME Suction filter Use as directed.MCAID ID [id number]  generic DME Nasal adapters Use as directed.MCAID ID [id number]  generic DME Pulse ox probes (disposable)Use as directed.MCAID ID [id number]  diphenhydrAMINE  (BENADRYL) 25 MG oral solid 25 mg by Per G Tube route nightly as needed for Sleep    FIBER SELECT GUMMIES PO Take by mouth Give 2 gummies daily    Multiple Vitamins-Minerals (HM MULTIVITAMIN ADULT GUMMY PO) Take by mouth Give 2 gummies daily    Ascorbic Acid (VITAMIN C ADULT GUMMIES PO) Take by mouth Give 2 gummies once daily    Cholecalciferol (VITAMIN D3) 50 MCG (2000  UT) capsule 2,000 Units by Per G Tube route daily    cetirizine (ZYRTEC) 1 MG/ML syrup 10 mg by Per G Tube route at bedtime    Insulin Aspart (NOVOLOG FLEXPEN SC) Inject 4 Units into the skin 3 times daily (before meals) FOR BG > 200. Alert MD for BG <70 and >250.     oxygen 2 l/m bleed through ventilator at night    Blood Glucose Monitoring Suppl (FIFTY50 GLUCOSE METER 2.0) w/Device KIT by Other route    budesonide (PULMICORT) 0.25 MG/2ML nebulizer solution Inhale 0.25 mg into the lungs 2 times daily     EPINEPHrine 0.15 mg/0.3 mL auto-injector Inject 0.15 mg into the muscle    lactulose (LACTULOSE) 20 GM/30ML solution 10 g by Per G Tube route 3 times daily Hold dose for loose stools    levETIRAcetam (KEPPRA) 100 MG/ML solution GIVE 15 MLS BY PER G TUBE ROUTE 2 (TWO) TIMES DAILY.    miconazole (MICATIN) 2 % powder 2 times daily as needed     insulin pen needle (FIFTY50 PEN NEEDLES) 31G X 8 MM 1 each     Medications reviewed at today's visit       Objective     OBJECTIVE   PHYSICAL EXAM  This visit was performed during a pandemic event, and the physical exam was limited to my Video observation of this patient's organ systems and/or body areas.   GEN: awake, alert. Laying in bed.   Pulm: no respiratory distress         ASSESSMENT   Today we discussed the assessment and management of the following diagnosis:    ICD-10-CM ICD-9-CM   1. Type 2 diabetes mellitus with hyperglycemia, with long-term current use of insulin  E11.65 250.00    Z79.4 790.29     V58.67   2. TBI (traumatic brain injury)  S06.9X9A 854.00   3. Spastic quadriplegia   G82.50 344.00   4. Ventilator dependence  Z99.11 V46.11       PLAN      1. Vent dependent, trach in place - No respiratory symptoms, has been doing better with   Symptoms improved with increase in nebs/vesting over the weekend. No other s/s of infection. [place] remains afebrile without cough need for increased oxygen or vent time.   -continue to monitor, educated/instructed to call with any concerns or worsening symptoms- if any changes occur- would check chest xray and tracheal sputum culture.   -return to normal schedule of nebs/vesting: BID and PRN    2. Type 2 diabetes mellitus with hyperglycemia, with long-term current use of insulin - Increase levemir to 70 units and call us in a week with Am BGs.     3. TBI (traumatic brain injury)  Underlying condition, all treatment decisions made with this in consideration      4. Spastic quadriplegia  Underlying condition, all treatment decisions made with this in consideration      5. G tube  Changed at home recently, they tried to get supplies through [facility] but they said they could not bill medicare (not sure why), he also has icircle.           Orders Placed This Encounter   No orders placed during this encounter.       Patient Instructions   Increase levemir to 70 units, call us in a week with his BG levels, goal is to have his morning sugars in 150s.     Keep appt in April as scheduled  We will look into gtube supplies again to see if we can get icircle to cover if medicare has denied       Return in about 6 weeks (around 10/03/2019).      Doretha Imus, MD

## 2019-08-26 ENCOUNTER — Telehealth: Payer: Self-pay | Admitting: Pediatrics

## 2019-08-26 ENCOUNTER — Telehealth: Payer: Self-pay | Admitting: Primary Care

## 2019-08-26 ENCOUNTER — Other Ambulatory Visit
Admission: RE | Admit: 2019-08-26 | Discharge: 2019-08-26 | Disposition: A | Discharge status: Home / Self Care | Payer: Medicare Other | Source: Ambulatory Visit

## 2019-08-26 ENCOUNTER — Ambulatory Visit: Payer: Medicare Other | Attending: Internal Medicine | Admitting: Internal Medicine

## 2019-08-26 DIAGNOSIS — R093 Abnormal sputum: Secondary | ICD-10-CM | POA: Insufficient documentation

## 2019-08-26 DIAGNOSIS — S069X9A Unspecified intracranial injury with loss of consciousness of unspecified duration, initial encounter: Secondary | ICD-10-CM | POA: Insufficient documentation

## 2019-08-26 DIAGNOSIS — S069XAA Unspecified intracranial injury with loss of consciousness status unknown, initial encounter: Secondary | ICD-10-CM

## 2019-08-26 DIAGNOSIS — G8250 Quadriplegia, unspecified: Secondary | ICD-10-CM | POA: Insufficient documentation

## 2019-08-26 DIAGNOSIS — J398 Other specified diseases of upper respiratory tract: Secondary | ICD-10-CM | POA: Insufficient documentation

## 2019-08-26 DIAGNOSIS — Z9911 Dependence on respirator [ventilator] status: Secondary | ICD-10-CM | POA: Insufficient documentation

## 2019-08-26 LAB — GRAM STAIN

## 2019-08-26 MED ORDER — CIPROFLOXACIN HCL 500 MG PO TABS *I*
500.0000 mg | ORAL_TABLET | Freq: Two times a day (BID) | ORAL | 0 refills | Status: AC
Start: 2019-08-26 — End: 2019-09-02

## 2019-08-26 MED ORDER — LINEZOLID 600 MG PO TABS *I*
600.0000 mg | ORAL_TABLET | Freq: Two times a day (BID) | ORAL | 0 refills | Status: DC
Start: 2019-08-26 — End: 2019-08-26

## 2019-08-26 MED ORDER — DOXYCYCLINE HYCLATE 100 MG PO TABS *I*
ORAL_TABLET | ORAL | 0 refills | Status: AC
Start: 2019-08-26 — End: 2019-09-05

## 2019-08-26 NOTE — Telephone Encounter (Signed)
Incoming paperwork for completion to the [facility]  Date Received: 08/26/2019    Type of paperwork (sender:form type): Revision to plan of care    Completed paperwork to be sent to:  Fax to [facility] [phone]    Special Requests:     Placed in box for: [name]

## 2019-08-26 NOTE — Patient Instructions (Addendum)
Increased sputum and secretions:  Sounds like he is starting to have some symptoms of pneumonia or tracheal infection:     -Albuterol/atrovent nebs increase to TID   -increase Saline nebs to TID  -increased vesting followed by cough assist three times/day  -continue guaifenesin 4x/day as needed for sputum production   -tylenol as needed for pain/discomfort/fever  -I will start him on an antibiotics based off his last sputum cultures:  cipro 500mg  twice a day  linezolid twice a day  - pretreat with benadryl 25mg  about 30 mins prior to giving him antibiotics  -follow up in 1 week for reassessment       -lets get him tested for COVID (I think this is low likelyhood that he has this but I think it would be better to check him)- tomorrow- have someone swing by our office and we will give you a swab to take home and swab him and then bring it back to that same day so we can send it.         : [phone]

## 2019-08-26 NOTE — Progress Notes (Signed)
[name] COMPLAINT     Chief Complaint   Patient presents with    Follow-up       Subjective     TELEMEDICINE CONSENT     Visit being conducted by Video in lieu of a face to face visit to minimize health care worker and patient exposure to COVID-19, and conserve PPE.    Location of Patient: home  Location of Telemedicine Provider: hospital / clinical location  Other Participants in telemedicine encounter and roles: Private nurse- Tonia     Consent was obtained from the patient to complete this telemedicine visit; including the potential for financial liability.  How did the patient provide consent? Form Completed    SUBJECTIVE     Increased secretions  This morning [place] started having some increase nasal drainage-  Oxygen sat was lower than normal- up and down during the day 94% currently- but it sometimes is down to 89-90% and then around 94% which is below his baseline.   Increased in trach secretions- green in color and   Coughing and has productive/sputum in his cough.   Secretions are green/yellow and has an odor   Vent at night with 1L of oxygen-   They have not needed to put any oxygen or vent on him today.   Heard wheezing in his lungs- some crackles- right lower lobe and rhochi  Nurse has b  Good mood/appetite   Has increased his nebs/vesting/cough assist today - with extra albuterol/saline/and cough assist  Eating normal amount  Gets water through his G-tube   This morning he had a temp of 98.1 and then 101.9- this has returned on its own without any tylenol/iubuprofen   Had 4 seizures overnight- this is not normal for him   Sputum sample - father has asked about cipro because it has worked well for himin the past. They pre-treat him with benadryl     MEDICATIONS     Current Outpatient Medications   Medication Sig    triamcinolone (KENALOG) 0.025 % cream APPLY TO AFFECTED AREA TWICE A DAY    Nebulizers (PARI LC PLUS VIOS PRO NEB) MISC Pari Vios Pro  Neb or comparable with Nebulizer kits and nebulizer trach mask. Use twice daily With albuterol.MCAID # [id number], MCARE #[id number]    fluticasone (FLONASE) 50 MCG/ACT nasal spray Spray 1 spray into nostril daily    budesonide (PULMICORT) 0.25 MG/2ML nebulizer solution Inhale 2 mLs (0.25 mg total) by nebulization 2 times daily Dx: J45.909    tobramycin, PF, (TOBI) 300 MG/5ML nebulizer solution NEBULIZE 1 VIAL (300 MG) TWICE DAILY EVERY OTHER MONTH   DIAGNOSIS: E84.0    valproate (VALPROIC ACID) 50 mg/mL syrup Take 5 mLs (250 mg total) by mouth 3 times daily    generic DME 99m Adapter for trach to be used as directed. Replace weekly. Dx.Z99.11    generic DME 15 mm adapters for nebulized therapies via trach  Medicare ID [id number] Medicaid ID [id number]   insulin detemir (LEVEMIR FLEXPEN) 100 UNIT/ML injection pen Inject 70 units into the skin nightly    nebulizer device with mask and tubing Use as directed    senna (SENOKOT) 8.6 MG tablet Administer 1 tablet via G tube daily Crush 1 tablet via G-tube daily.  Hold for loose stools.    B-D UF III MINI PEN NEEDLES 31G X 5 MM USE 3 (THREE) TIMES DAILY AS NEEDED.    famotidine (PEPCID) 20 MG  tablet TAKE 1 TABLET BY MOUTH 2 (TWO) TIMES DAILY. OK TO CRUSH IT AND PUT IT DOWN TUBE IF NECESSARY    generic DME Please dispense one 18m syringe. Use as directed.    generic DME MCAID ID [id number] BIVONA CUFFED TRACH 7.0 Please change trach every month and as needed.    Accu-Chek FastClix Lancets MISC 1 EACH BY MISC.(NON-DRUG COMBO ROUTE) ROUTE 3 (THREE) TIMES DAILY. E11.65    blood glucose (ACCU-CHEK AVIVA PLUS) test strip USE 1 STRIP 3 (THREE) TIMES DAILY and  AS NEEDED.Dx: E11.65 and Z79.4    ipratropium (ATROVENT) 0.02 % nebulizer solution Inhale 2.5 mLs (500 mcg total) by nebulization 2 times daily    baclofen (LIORESAL) 10 MG tablet TAKE 1 TABLET BY MOUTH TWICE A DAY    generic DME Please dispense 329mmeasuring cup. Use as directed.    lactobacillus  rhamnosus, GG, (CULTURELLE) capsule Take 1 capsule (1 each total) by mouth daily    Feeding Tubes - Sets (MIC-KEY GASTROSTOMY KIT 14FR) MISC Please dispense every 6 months and prn, MicKey 14Fr 2.5cm    Gauze Pads & Dressings (SPLIT GAUZE DRAINAGE SPONGE) 4"X4" Use once and PRN times a day as instructed.    gauze pads 2"X2" Use as directed around trach. MCAID ID [id number]  generic DME 10 isothermal adult "antiflect" connector. MCAID [id number] as directed.    Nebulizer MISC Disposable nebulizer kits to be used inline with trach  MCAID ID [id number]  generic DME Suction catheter with chimney valve.Use as directed.MCAID ID [id number]  albuterol (PROVENTIL) (2.5 mg/1m27m0.083% nebulizer solution Take 3 mLs (2.5 mg total) by nebulization 2 times daily Give twice daily with vesting    bisacodyl (DULCOLAX) 10 MG suppository Please give suppository as needed  if no BM in 3 days, please check with Dad prior to giving    clonazePAM (KLONOPIN) 1 MG tablet TAKE 1 CRUSHED TABLET VIA G-TUBE AS NEEDED FOR CLUSTER SEIZURES, MAX 3 TABS BACK TO BACK    GUAIFENESIN PO Take 200 mg by mouth every 4 hours as needed (For cough or congestion)     generic DME Non-sterile gloves size medium Use as directed. MCAID ID [id number]/month    zinc oxide (DESITIN) 40 % paste Apply topically as needed for Diaper Rash    generic DME tRACH TIES WITH O2 PORT. Use as directed.MCAID ID [id number] sodium chloride 0.9 % nebulizer solution Take 3 mLs by nebulization as needed for Wheezing MCAID ID [id number] gauze sponge (CURITY) 4"X4" pads Use PRN as instructed.MCAID ID [id number] generic DME PASSEY MUIR VALVE (PURPLE) Use as tolerated.MCAID ID [id number] Adhesive Tape 1"x5yd TAPE By 1 Units no specified route as needed MCAID ID [id number] generic DME 10 ml syringes Use as directed.MCAID ID [id number] generic DME Trach care kits Use as directed.MCAID ID [id number] surgical lubricant (SURGILUBE) gel Apply topically as  needed MCAID ID [id number] Distilled Water LIQD Distilled water, use as directedMCAID ID [id number] generic DME Mepilex 4X 4. Use as directed.MCAID ID [id number] generic DME HME for trach humidification Use as directed.MCAID ID [id number] generic DME Cough Assist circuits Use as directed.MCAID ID [id number] generic DME Suction tubing Use as directed.MCAID ID [id number] generic DME Suction canister Use as directed.MCAID ID [id number]  generic DME Suction filter Use as directed.MCAID ID [id number]    generic DME Nasal adapters Use as directed.MCAID ID [id number]    generic DME Pulse ox probes (disposable)Use as directed.MCAID ID [id number]    diphenhydrAMINE (BENADRYL) 25 MG oral solid 25 mg by Per G Tube route nightly as needed for Sleep    FIBER SELECT GUMMIES PO Take by mouth Give 2 gummies daily    Multiple Vitamins-Minerals (HM MULTIVITAMIN ADULT GUMMY PO) Take by mouth Give 2 gummies daily    Ascorbic Acid (VITAMIN C ADULT GUMMIES PO) Take by mouth Give 2 gummies once daily    Cholecalciferol (VITAMIN D3) 50 MCG (2000 UT) capsule 2,000 Units by Per G Tube route daily    cetirizine (ZYRTEC) 1 MG/ML syrup 10 mg by Per G Tube route at bedtime    Insulin Aspart (NOVOLOG FLEXPEN SC) Inject 4 Units into the skin 3 times daily (before meals) FOR BG > 200. Alert MD for BG <70 and >250.     oxygen 2 l/m bleed through ventilator at night    Blood Glucose Monitoring Suppl (FIFTY50 GLUCOSE METER 2.0) w/Device KIT by Other route    EPINEPHrine 0.15 mg/0.3 mL auto-injector Inject 0.15 mg into the muscle    lactulose (LACTULOSE) 20 GM/30ML solution 10 g by Per G Tube route 3 times daily Hold dose for loose stools    levETIRAcetam (KEPPRA) 100 MG/ML solution GIVE 15 MLS BY PER G TUBE ROUTE 2 (TWO) TIMES DAILY.    miconazole (MICATIN) 2 % powder 2 times daily as needed     insulin pen needle (FIFTY50 PEN NEEDLES) 31G X 8 MM 1 each     Medications reviewed at today's visit       Objective     OBJECTIVE    PHYSICAL EXAM  This visit was performed during a pandemic event, and the physical exam was limited to my Video observation of this patient's organ systems and/or body areas.       Physical Exam   Constitutional: He is well-developed, well-nourished, and in no distress. No distress.   Skin: He is not diaphoretic.            ASSESSMENT   Today we discussed the assessment and management of the following diagnosis:    ICD-10-CM ICD-9-CM   1. Increased sputum production  R09.3 786.4   2. Ventilator dependence  Z99.11 V46.11   3. Spastic quadriplegia  G82.50 344.00   4. TBI (traumatic brain injury)  S06.9X9A 854.00       PLAN      1. Increased sputum production  Start Cipro 500 mg twice daily along with linezolid 600 mg twice daily  Pretreat with Benadryl 25 mg 30 minutes prior  Increase airway clearance and inhaled medications to 3 times daily-instructed to do testing followed by CoughAssist 3 times daily  Encouraged continue guaifenesin 4 times a day as needed  Other symptom management discussed  Follow-up in 1 week for reassessment of symptoms  - COVID-19 PCR; Future    2. Ventilator dependence  Underlying condition, all treatment decisions made with this in consideration       3. Spastic quadriplegia  Underlying condition, all treatment decisions made with this in consideration       4. TBI (traumatic brain injury)  Underlying condition, all treatment decisions made with this in consideration            Orders Placed This Encounter    COVID-19 PCR  ciprofloxacin (CIPRO) 500 MG tablet       Patient Instructions   Increased sputum and secretions:  Sounds like he is starting to have some symptoms of pneumonia or tracheal infection:     -Albuterol/atrovent nebs increase to TID   -increase Saline nebs to TID  -increased vesting followed by cough assist three times/day  -continue guaifenesin 4x/day as needed for sputum production   -tylenol as needed for pain/discomfort/fever  -I will start him on an antibiotics based  off his last sputum cultures:  cipro 523m twice a day  linezolid twice a day  - pretreat with benadryl 270mabout 30 mins prior to giving him antibiotics  -follow up in 1 week for reassessment       -lets get him tested for COVID (I think this is low likelyhood that he has this but I think it would be better to check him)- tomorrow- have someone swing by our office and we will give you a swab to take home and swab him and then bring it back to [place] same day so we can send it.         FaClover Mealy: [phone]         No follow-ups on file.      KrBarbaraann BoysNP

## 2019-08-26 NOTE — Telephone Encounter (Addendum)
RN called due to concerns brought up by pharmacist: both cipro and linezolid can cause hypoglycemia with levemir.     I discussed this with dad these concerns and RN.     He has done well with cipro in the past as long as he gets pretreatment. He has been on linezolid and doxycycline previously.     Reviewing his most recent staph cultures, it seems doxycycline would work as well for him and not interfere with BGs.     After discussing with dad and RN, will use doxycycline rather than linezolid to mitigate this risk some. Dad did bring up if he should use some doxycycline that was prescribed to him, I recommended use of prescribed doxycycline for [name] to avoid any unintentional errors.     Verbal order given to discontinue linezolid. I encouraged close monitoring of BGs, can check once overnight.    [name], MD

## 2019-08-26 NOTE — Telephone Encounter (Signed)
Spoke with Dorisann Frames and her supervising RN Inocencio Homes?)  She started caring for pt at 8:30 this morning  Overnight report as follows:  Pt having nasal congestion with green secretions that were malodorous   Also had 4 seizures   This morning he is having low grade temps, needing to be suctioned for increased secretions  He is voiding at his baseline, tolerating feeds and is not constipated    They will increase fluids this morning, drop off sputum and give tylenol  Video visit booked with NP this afternoon  Routing to NP/MD

## 2019-08-26 NOTE — Telephone Encounter (Signed)
[facility] TELEPHONE TRIAGE     Reason for call: [name], RN called reporting that patient has been having green secretion come out of his nose, some congestion, and bad odor smells. She states he also had 4 seizures last night.  [name] stated that this started overnight and would like some guidance on what they should be doing to help patient. Tonya requested a call back regarding this.    Name of caller:[name] for [name]  Relationship to patient: primary day RN  Phone:  [phone]

## 2019-08-27 ENCOUNTER — Encounter: Payer: Self-pay | Admitting: Gastroenterology

## 2019-08-27 ENCOUNTER — Ambulatory Visit: Payer: Medicare Other | Attending: Internal Medicine

## 2019-08-27 ENCOUNTER — Telehealth: Payer: Self-pay | Admitting: Pediatrics

## 2019-08-27 DIAGNOSIS — Z20828 Contact with and (suspected) exposure to other viral communicable diseases: Secondary | ICD-10-CM | POA: Insufficient documentation

## 2019-08-27 DIAGNOSIS — R093 Abnormal sputum: Secondary | ICD-10-CM | POA: Insufficient documentation

## 2019-08-27 DIAGNOSIS — Z20822 Contact with and (suspected) exposure to covid-19: Secondary | ICD-10-CM | POA: Insufficient documentation

## 2019-08-27 DIAGNOSIS — J45909 Unspecified asthma, uncomplicated: Secondary | ICD-10-CM

## 2019-08-27 MED ORDER — PARI LC PLUS VIOS PRO NEB MISC
0 refills | Status: DC
Start: 2019-08-27 — End: 2019-09-10

## 2019-08-27 NOTE — Progress Notes (Signed)
Pt arrives for symptomatic covid swab.  Appropriate PPE donned.  Pt tolerated well.  Sample sent out for testing.  Ivery Quale, RN

## 2019-08-27 NOTE — Telephone Encounter (Signed)
Rx reprinted, please fax to [facility]

## 2019-08-27 NOTE — Telephone Encounter (Signed)
[facility] TELEPHONE TRIAGE     Reason for call: Annetta from [facility] pharmacy called requesting a new script for patient's nebulizer. She stated the medication he'll be using with the nebulizer was not indicated in script and a new one will need to be sent over including the medication.     Name of caller:Annetta for [name]  Relationship to patient: Probation officer (if applicable): [facility]  Phone:  [phone] ext [phone]  Fax (if applicable): [phone]

## 2019-08-28 ENCOUNTER — Telehealth: Payer: Self-pay | Admitting: Pediatrics

## 2019-08-28 ENCOUNTER — Other Ambulatory Visit: Payer: Self-pay | Admitting: Pediatrics

## 2019-08-28 DIAGNOSIS — R569 Unspecified convulsions: Secondary | ICD-10-CM

## 2019-08-28 LAB — COVID-19 NAAT (PCR): COVID-19 NAAT (PCR): NEGATIVE

## 2019-08-28 LAB — COVID-19 PCR

## 2019-08-28 MED ORDER — VALPROATE SODIUM 250 MG/5ML PO SOLN *I*
250.0000 mg | Freq: Three times a day (TID) | ORAL | 1 refills | Status: DC
Start: 2019-08-28 — End: 2019-09-30

## 2019-08-28 NOTE — Telephone Encounter (Signed)
[facility] TELEPHONE TRIAGE     Reason for call: [name] with [facility] [employer] inquiring on results of Covid swab  and sputum results.    Requested call back     Name of caller:[name] for  Angel Wells  Relationship to patient: Nurse  Organization (if applicable):[facility] [employer]   Phone:  [phone]  Fax (if applicable): [phone]

## 2019-08-28 NOTE — Telephone Encounter (Signed)
covid negative. Still awaiting sputum culture identification- continue current course of antibiotics (doxy and cipro) with increased nebs/vesting until further notice. Let us know if he appears worse at all over the course of the week. Thanks   Jodi Geralds, NP

## 2019-08-28 NOTE — Telephone Encounter (Signed)
Pharamacy requesting a 90 day supply

## 2019-08-29 LAB — AEROBIC CULTURE

## 2019-08-29 NOTE — Telephone Encounter (Signed)
Paperwork completed 08/29/2019  Sent as outlined in prior documentation

## 2019-09-02 ENCOUNTER — Encounter: Payer: Self-pay | Admitting: Gastroenterology

## 2019-09-02 ENCOUNTER — Telehealth: Payer: Self-pay | Admitting: Pediatrics

## 2019-09-02 ENCOUNTER — Other Ambulatory Visit: Payer: Self-pay | Admitting: Pediatrics

## 2019-09-02 DIAGNOSIS — J4540 Moderate persistent asthma, uncomplicated: Secondary | ICD-10-CM

## 2019-09-02 MED ORDER — BUDESONIDE 0.25 MG/2ML IN SUSP *I*
0.2500 mg | Freq: Two times a day (BID) | RESPIRATORY_TRACT | 5 refills | Status: DC
Start: 2019-09-02 — End: 2019-09-02

## 2019-09-02 MED ORDER — BUDESONIDE 0.25 MG/2ML IN SUSP *I*
0.2500 mg | Freq: Two times a day (BID) | RESPIRATORY_TRACT | 5 refills | Status: DC
Start: 2019-09-02 — End: 2019-09-04

## 2019-09-02 NOTE — Telephone Encounter (Signed)
Fax from [facility] states this RX needs Dx Code for Medicare to pay for it

## 2019-09-02 NOTE — Telephone Encounter (Signed)
Incoming paperwork for completion to the [facility]  Date Received: 09/02/2019    Type of paperwork (sender:form type): [facility] Letter of Medical Necessity for nebulizer    Completed paperwork to be sent to:  Fax to [facility] [phone]    Special Requests:     Placed in box for: [name]

## 2019-09-02 NOTE — Telephone Encounter (Signed)
Spoke with Angel Wells  Reports that overall Angel Wells is doing better  No more fevers, sputum is less copious not as malodorous as it was wither  Still yellow  Tolerating feeds, voiding, having normal BM's    Will finish doxy tonight  Missed one dose of cipro, wondering if MD would like to write for additional tab?  Routing to MD/NP    Also inquiring about new equipment for cough assist and vent, was told to replace after sputum infection but they only get limited replacements  Routing to RT

## 2019-09-02 NOTE — Telephone Encounter (Signed)
Resent with dx code.  ?

## 2019-09-02 NOTE — Telephone Encounter (Signed)
Called and left message for tonya  Requested a call back to the office   Office number provided   Dorian Pod, [place]  09/02/2019  10:53 AM

## 2019-09-02 NOTE — Telephone Encounter (Signed)
[facility] TELEPHONE TRIAGE     Reason for call: [name] called reporting that one of the [facility] nurse accidentally wasted patient's does of ciprofloxacin and now patient will be missing a dose of cipro for today. Tonya wanted to make sure if that was okay with pcp.     [name] also reported that patient's secretions are still yellow and would like to know if patient should keep taking the antibiotics. [name] would like a call back on how they she should be advised soon.    Name of caller:[name] for [name]  Relationship to patient: RN  Phone:  [phone]

## 2019-09-02 NOTE — Telephone Encounter (Signed)
Since he is better I dont feel strongly that he needs to make up that extra dose. Thanks  KMR

## 2019-09-02 NOTE — Addendum Note (Signed)
Addended by: Midge Minium on: 09/02/2019 02:34 PM     Modules accepted: Orders

## 2019-09-02 NOTE — Telephone Encounter (Signed)
Spoke to [place] and [facility] has delivered new supplies so they are all set.

## 2019-09-03 ENCOUNTER — Other Ambulatory Visit: Payer: Self-pay | Admitting: Pediatrics

## 2019-09-03 DIAGNOSIS — J4540 Moderate persistent asthma, uncomplicated: Secondary | ICD-10-CM

## 2019-09-03 LAB — LEGIONELLA CULTURE: Legionella Culture: 0

## 2019-09-03 NOTE — Telephone Encounter (Signed)
Per the pharmacy, "patient requests new Rx, pt stated using 3 times daily now"

## 2019-09-03 NOTE — Telephone Encounter (Signed)
Spoke with Dorisann Frames  Aware that NP does not feel it is necessary for pt to get that missed dose of cipro  Pt continues to improve    Tonia did ask about fu, was told pt should have a fu visit  Also inquiring about the need to continue with increased airway clearance  And pts BG's being higher than normal (possibly because her has been sick?)    Booked for video fu with pcp tomorrow afternoon

## 2019-09-04 ENCOUNTER — Ambulatory Visit: Payer: Medicare Other | Attending: Pediatrics | Admitting: Pediatrics

## 2019-09-04 DIAGNOSIS — S069X9A Unspecified intracranial injury with loss of consciousness of unspecified duration, initial encounter: Secondary | ICD-10-CM | POA: Insufficient documentation

## 2019-09-04 DIAGNOSIS — E1165 Type 2 diabetes mellitus with hyperglycemia: Secondary | ICD-10-CM | POA: Insufficient documentation

## 2019-09-04 DIAGNOSIS — S069XAA Unspecified intracranial injury with loss of consciousness status unknown, initial encounter: Secondary | ICD-10-CM

## 2019-09-04 DIAGNOSIS — J9611 Chronic respiratory failure with hypoxia: Secondary | ICD-10-CM

## 2019-09-04 DIAGNOSIS — Z8701 Personal history of pneumonia (recurrent): Secondary | ICD-10-CM

## 2019-09-04 DIAGNOSIS — Z794 Long term (current) use of insulin: Secondary | ICD-10-CM

## 2019-09-04 DIAGNOSIS — Z9911 Dependence on respirator [ventilator] status: Secondary | ICD-10-CM

## 2019-09-04 DIAGNOSIS — J45909 Unspecified asthma, uncomplicated: Secondary | ICD-10-CM

## 2019-09-04 MED ORDER — TOBRAMYCIN 300 MG/5ML IN NEBU *I*
INHALATION_SOLUTION | RESPIRATORY_TRACT | 5 refills | Status: DC
Start: 2019-09-04 — End: 2020-08-02

## 2019-09-04 MED ORDER — BUDESONIDE 0.25 MG/2ML IN SUSP *I*
0.2500 mg | Freq: Two times a day (BID) | RESPIRATORY_TRACT | 5 refills | Status: DC
Start: 2019-09-04 — End: 2019-09-30

## 2019-09-04 NOTE — Telephone Encounter (Signed)
Per the pharmacist Boneta Lucks, patient's caregiver requested to update the Rx to TID.  They received an Rx for BID but per medicaid requirements, it needs a Dx code.     BID rx pended for provider review.

## 2019-09-04 NOTE — Progress Notes (Signed)
[place]    TELEMEDICINE VISIT  CHIEF COMPLAINT     Chief Complaint   Patient presents with    Pneumonia    Diabetes       Subjective     TELEMEDICINE CONSENT     Visit being conducted by Video in lieu of a face to face visit to minimize health care worker and patient exposure to COVID-19, and conserve PPE.    Location of Patient: home  Location of Telemedicine Provider: hospital / clinical location  Other Participants in telemedicine encounter and roles: home nurse- Festus Aloe    Consent was obtained from the patient to complete this telemedicine visit; including the potential for financial liability.  How did the patient provide consent? Verbal Consent Only    SUBJECTIVE     Pseudomonas Pneumonia - Finished antibiotics for pneumonia, cx grew out pseudomonas, still having some yellow secretions, had a small plug in his trach early this morning, oxygen is 97%, they were running lower than normal, off O2, just on the vent at night, he did not have to go on the vent during this episode  Trach aspirate grew out pseudomonas, he responded well to antibiotics course.    Diabetes:  Have been higher during illness which is expected  Fasting: 222-260  prelunch - 192-275  predinner - 197-218    Bowel movements - on bowel regimen with senna/lactulose, gets 22m 8 times a day per tube, also takes by mouth but typically sips    Seizures - having them randomly, but not more frequently than before    Gtube - still awaiting replacement from [place] now in place, [facility]     Current Outpatient Medications   Medication Sig    budesonide (PULMICORT) 0.25 MG/2ML nebulizer solution Inhale 2 mLs (0.25 mg total) by nebulization 2 times daily Dx: J45.909    tobramycin, PF, (TOBI) 300 MG/5ML nebulizer solution NEBULIZE 1 VIAL (300 MG) TWICE DAILY EVERY OTHER MONTH   DIAGNOSIS: E84.0    valproate (VALPROIC ACID) 50 mg/mL syrup Take 5 mLs (250 mg  total) by mouth 3 times daily    Nebulizers (PARI LC PLUS VIOS PRO NEB) MISC Pari Vios Pro Neb or comparable . Use as directed With albuterol    doxycycline hyclate (VIBRA-TABS) 100 MG tablet GIVE 1 TAB CRUSHED TWICE DAILY VIA G TUBE    generic DME 163mAdapter for trach to be used as directed. Replace weekly. Dx.Z99.11    generic DME 15 mm adapters for nebulized therapies via trach  Medicare ID [id number] ID [id number]  insulin detemir (LEVEMIR FLEXPEN) 100 UNIT/ML injection pen Inject 70 units into the skin nightly    nebulizer device with mask and tubing Use as directed    fluticasone (FLONASE) 50 MCG/ACT nasal spray Spray 1 spray into nostril daily    senna (SENOKOT) 8.6 MG tablet Administer 1 tablet via G tube daily Crush 1 tablet via G-tube daily.  Hold for loose stools.    B-D UF III MINI PEN NEEDLES 31G X 5 MM USE 3 (THREE) TIMES DAILY AS NEEDED.    famotidine (PEPCID) 20 MG tablet TAKE 1 TABLET BY MOUTH 2 (TWO) TIMES DAILY. OK TO CRUSH IT AND PUT IT DOWN TUBE IF NECESSARY    generic DME Please dispense one 5051myringe. Use as directed.    generic DME MCAID ID [id number] CUFFED TRACH 7.0  Please change trach every month and as needed.    triamcinolone (KENALOG) 0.025 % cream APPLY TO AFFECTED AREA TWICE A DAY    Accu-Chek FastClix Lancets MISC 1 EACH BY MISC.(NON-DRUG COMBO ROUTE) ROUTE 3 (THREE) TIMES DAILY. E11.65    blood glucose (ACCU-CHEK AVIVA PLUS) test strip USE 1 STRIP 3 (THREE) TIMES DAILY and  AS NEEDED.Dx: E11.65 and Z79.4    ipratropium (ATROVENT) 0.02 % nebulizer solution Inhale 2.5 mLs (500 mcg total) by nebulization 2 times daily    baclofen (LIORESAL) 10 MG tablet TAKE 1 TABLET BY MOUTH TWICE A DAY    generic DME Please dispense 33m measuring cup. Use as directed.    lactobacillus rhamnosus, GG, (CULTURELLE) capsule Take 1 capsule (1 each total) by mouth daily    Feeding Tubes - Sets (MIC-KEY GASTROSTOMY KIT 14FR) MISC Please dispense every 6 months and  prn, MicKey 14Fr 2.5cm    Gauze Pads & Dressings (SPLIT GAUZE DRAINAGE SPONGE) 4"X4" Use once and PRN times a day as instructed.    gauze pads 2"X2" Use as directed around trach. MCAID ID [id number]   generic DME 10 isothermal adult "antiflect" connector. MCAID [id number] as directed.    Nebulizer MISC Disposable nebulizer kits to be used inline with trach  MCAID ID [id number]   generic DME Suction catheter with chimney valve.Use as directed.MCAID ID [id number]   albuterol (PROVENTIL) (2.5 mg/340m 0.083% nebulizer solution Take 3 mLs (2.5 mg total) by nebulization 2 times daily Give twice daily with vesting    bisacodyl (DULCOLAX) 10 MG suppository Please give suppository as needed  if no BM in 3 days, please check with Dad prior to giving    clonazePAM (KLONOPIN) 1 MG tablet TAKE 1 CRUSHED TABLET VIA G-TUBE AS NEEDED FOR CLUSTER SEIZURES, MAX 3 TABS BACK TO BACK    GUAIFENESIN PO Take 200 mg by mouth every 4 hours as needed (For cough or congestion)     generic DME Non-sterile gloves size medium Use as directed. MCAID ID [id number]/month    zinc oxide (DESITIN) 40 % paste Apply topically as needed for Diaper Rash    generic DME tRACH TIES WITH O2 PORT. Use as directed.MCAID ID [id number]  sodium chloride 0.9 % nebulizer solution Take 3 mLs by nebulization as needed for Wheezing MCAID ID [id number]  gauze sponge (CURITY) 4"X4" pads Use PRN as instructed.MCAID ID [id number]  generic DME PASSEY MUIR VALVE (PURPLE) Use as tolerated.MCAID ID [id number]  Adhesive Tape 1"x5yd TAPE By 1 Units no specified route as needed MCAID ID [id number]  generic DME 10 ml syringes Use as directed.MCAID ID [id number]  generic DME Trach care kits Use as directed.MCAID ID [id number]  surgical lubricant (SURGILUBE) gel Apply topically as needed MCAID ID [id number]  Distilled Water LIQD Distilled water, use as directedMCAID ID [id number]  generic DME Mepilex 4X 4. Use as directed.MCAID ID [id number]  generic DME  HME for trach humidification Use as directed.MCAID ID [id number]  generic DME Cough Assist circuits Use as directed.MCAID ID [id number]  generic DME Suction tubing Use as directed.MCAID ID [id number]  generic DME Suction canister Use as directed.MCAID ID [id number]  generic DME Suction filter Use as directed.MCAID ID [id number]  generic DME Nasal adapters Use as directed.MCAID ID [id number]  generic DME Pulse ox probes (disposable)Use  as directed.MCAID ID [id number]    diphenhydrAMINE (BENADRYL) 25 MG oral solid 25 mg by Per G Tube route nightly as needed for Sleep    FIBER SELECT GUMMIES PO Take by mouth Give 2 gummies daily    Multiple Vitamins-Minerals (HM MULTIVITAMIN ADULT GUMMY PO) Take by mouth Give 2 gummies daily    Ascorbic Acid (VITAMIN C ADULT GUMMIES PO) Take by mouth Give 2 gummies once daily    Cholecalciferol (VITAMIN D3) 50 MCG (2000 UT) capsule 2,000 Units by Per G Tube route daily    cetirizine (ZYRTEC) 1 MG/ML syrup 10 mg by Per G Tube route at bedtime    Insulin Aspart (NOVOLOG FLEXPEN SC) Inject 4 Units into the skin 3 times daily (before meals) FOR BG > 200. Alert MD for BG <70 and >250.     oxygen 2 l/m bleed through ventilator at night    Blood Glucose Monitoring Suppl (FIFTY50 GLUCOSE METER 2.0) w/Device KIT by Other route    EPINEPHrine 0.15 mg/0.3 mL auto-injector Inject 0.15 mg into the muscle    lactulose (LACTULOSE) 20 GM/30ML solution 10 g by Per G Tube route 3 times daily Hold dose for loose stools    levETIRAcetam (KEPPRA) 100 MG/ML solution GIVE 15 MLS BY PER G TUBE ROUTE 2 (TWO) TIMES DAILY.    miconazole (MICATIN) 2 % powder 2 times daily as needed     insulin pen needle (FIFTY50 PEN NEEDLES) 31G X 8 MM 1 each     Medications reviewed at today's visit       Objective     OBJECTIVE   PHYSICAL EXAM  This visit was performed during a pandemic event, and the physical exam was limited to my Video observation of this patient's organ systems and/or body areas.   GEN:  awake, alert. Laying in bed.   Pulm:          ASSESSMENT   Today we discussed the assessment and management of the following diagnosis:    ICD-10-CM ICD-9-CM   1. Asthma, unspecified asthma severity, unspecified whether complicated, unspecified whether persistent  J45.909 493.90   2. TBI (traumatic brain injury)  S06.9X9A 854.00   3. Chronic respiratory failure with hypoxia  J96.11 518.83     799.02   4. Ventilator dependence  Z99.11 V46.11   5. H/O pneumonia due to Pseudomonas  Z87.01 V12.61   6. Type 2 diabetes mellitus with hyperglycemia, with long-term current use of insulin  E11.65 250.00    Z79.4 790.29     V58.67       PLAN      1. Vent dependent, trach in place - No respiratory symptoms, has been doing better with   S/p course of antibiotics with cipro/linezolid  -continue to monitor, educated/instructed to call with any concerns or worsening symptoms- if any changes occur- would check chest xray and tracheal sputum culture.   Go back down to budesonide to BID  Continue vesting/albuterol/saline TID through Monday, then go back to BID  -return to normal schedule of nebs/vesting: BID and PRN    2. Type 2 diabetes mellitus with hyperglycemia, with long-term current use of insulin - Increase levemir to 75 units and call us in a week with Am BGs.     3. TBI (traumatic brain injury)  Underlying condition, all treatment decisions made with this in consideration      4. Spastic quadriplegia  Underlying condition, all treatment decisions made with this in consideration  5. Asthma - he requires nebulized medicines to keep this condition stable and help prevent him from getting more sick.  The nebulizer machine is medically necessary to keep him healthy at home and help prevent hospitalizations and further decompensation of his asthma and other respiratory conditions.    6. History of pseudomonas infection - will start every other month tobramycin inhaled therapy  Orders Placed This Encounter    tobramycin, PF,  (TOBI) 300 MG/5ML nebulizer solution       Patient Instructions   Go back down to budesonide to BID    Continue vesting/albuterol/saline TID through Monday, then go back to BID    Increase to Levemir 75 units qhs    We will send chart note to [facility]    We will start tobramycin nebulized medicine every other month       Return in about 6 weeks (around 10/16/2019) for Dr Shanda Howells.      Doretha Imus, MD

## 2019-09-04 NOTE — Patient Instructions (Addendum)
Go back down to budesonide to BID    Continue vesting/albuterol/saline TID through Monday, then go back to BID    Increase to Levemir 75 units qhs    We will send chart note to [facility]    We will start tobramycin nebulized medicine every other month

## 2019-09-05 ENCOUNTER — Telehealth: Payer: Self-pay | Admitting: Pediatrics

## 2019-09-05 NOTE — Telephone Encounter (Signed)
Incoming paperwork for completion to the [facility]  Date Received: 09/05/2019    Type of paperwork (sender:form type): Revision to Plan of Care As of 08/26/19    Completed paperwork to be sent to:  Fax to [facility] [phone]    Special Requests:     Placed in box for: [name]

## 2019-09-07 ENCOUNTER — Telehealth: Payer: Self-pay | Admitting: Pediatrics

## 2019-09-07 NOTE — Telephone Encounter (Signed)
Incoming paperwork for completion to the [facility]  Date Received: 09/07/2019     Type of paperwork (sender:form type): home health certification     Completed paperwork to be sent to:  Fax to [facility] [phone]    Special Requests:     Placed in box for: [name]

## 2019-09-08 ENCOUNTER — Encounter: Payer: Self-pay | Admitting: Gastroenterology

## 2019-09-08 ENCOUNTER — Encounter: Payer: Self-pay | Admitting: Pediatrics

## 2019-09-09 ENCOUNTER — Telehealth: Payer: Self-pay

## 2019-09-09 NOTE — Telephone Encounter (Signed)
Paperwork completed 09/09/2019  Sent as outlined in prior documentation

## 2019-09-09 NOTE — Telephone Encounter (Signed)
Contacted [facility] to discuss billing for G-Tube -- It needs to be billed through medicare and denied before it can be billed to [facility] and approved. Right now they are showing a Gtube has not been sent, writer waiting for response about what is needed from our end. Will follow up.

## 2019-09-10 ENCOUNTER — Other Ambulatory Visit: Payer: Self-pay | Admitting: Pediatrics

## 2019-09-10 MED ORDER — FLUTICASONE PROPIONATE 50 MCG/ACT NA SUSP *I*
1.0000 | Freq: Every day | NASAL | 1 refills | Status: DC
Start: 2019-09-10 — End: 2020-02-26

## 2019-09-10 NOTE — Telephone Encounter (Signed)
Paperwork completed 09/10/2019  Sent as outlined in prior documentation

## 2019-09-10 NOTE — Telephone Encounter (Signed)
Writer spoke with Annetta. She states the Rx script for patient's  nebulizer has all the correct information on it but it also needs to state Nebulizer kits and nebulizer mask on the Rx. She also will need a letter of medical necessity and chart notes faxed with Rx.     Annetta left a call back number for any questions.    Writer re-pended rx.    Phone: [phone] est:[phone]  Fax: [phone]

## 2019-09-10 NOTE — Telephone Encounter (Signed)
Pharmacy requesting a 90 day supply of fluticasone spray

## 2019-09-11 ENCOUNTER — Encounter: Payer: Self-pay | Admitting: Pediatrics

## 2019-09-11 ENCOUNTER — Other Ambulatory Visit: Payer: Self-pay | Admitting: Pediatrics

## 2019-09-11 DIAGNOSIS — J45909 Unspecified asthma, uncomplicated: Secondary | ICD-10-CM

## 2019-09-11 MED ORDER — PARI LC PLUS VIOS PRO NEB MISC
0 refills | Status: DC
Start: 2019-09-11 — End: 2022-06-23

## 2019-09-11 MED ORDER — PARI LC PLUS VIOS PRO NEB MISC
0 refills | Status: DC
Start: 2019-09-11 — End: 2019-09-11

## 2019-09-12 ENCOUNTER — Telehealth: Payer: Self-pay

## 2019-09-12 NOTE — Telephone Encounter (Signed)
Writer faxed RX to [facility]

## 2019-09-12 NOTE — Telephone Encounter (Signed)
Reached out to [employer] again to see if they have billed the appropriate insurance and sent supplies. Patient is in need of a new gtube. Per [employer] representative, medicare has been billed and they are still waiting for the denial, before they can bill secondary insurance, medicaid. When medicaid is able to be billed, they will send new supplies.

## 2019-09-16 ENCOUNTER — Ambulatory Visit: Payer: Medicare Other | Attending: Primary Care

## 2019-09-16 ENCOUNTER — Telehealth: Payer: Self-pay | Admitting: Pediatrics

## 2019-09-16 DIAGNOSIS — Z23 Encounter for immunization: Secondary | ICD-10-CM | POA: Insufficient documentation

## 2019-09-16 NOTE — Telephone Encounter (Signed)
Please have her continue this twice daily

## 2019-09-16 NOTE — Telephone Encounter (Signed)
[facility] TELEPHONE TRIAGE     Reason for call: Angel Wells needs order clarification for ipratropium (ATROVENT) 0.02 % nebulizer solution (Order #[id number])   Needs to know if Dr [name] still needs this medication, or if it should be discontinued.   Requested a call back    Name of caller:Angel Wells for Angel Wells  Relationship to patient: PDN  Organization (if applicable):   Phone:  [phone]  Fax (if applicable):

## 2019-09-19 ENCOUNTER — Other Ambulatory Visit: Payer: Self-pay | Admitting: Pediatrics

## 2019-09-19 DIAGNOSIS — L309 Dermatitis, unspecified: Secondary | ICD-10-CM

## 2019-09-19 NOTE — Telephone Encounter (Signed)
Spoke with [name], clarified that she can continue with the Atrovent twice daily.  She verbalized understanding and was thankful for the returned call.

## 2019-09-23 ENCOUNTER — Telehealth: Payer: Self-pay | Admitting: Pediatrics

## 2019-09-23 DIAGNOSIS — E1165 Type 2 diabetes mellitus with hyperglycemia: Secondary | ICD-10-CM

## 2019-09-23 DIAGNOSIS — Z794 Long term (current) use of insulin: Secondary | ICD-10-CM

## 2019-09-23 NOTE — Telephone Encounter (Signed)
Spoke with [place]  Reports yesterday that the pts secretions were "very yucky"  Dark yellow, almost green and thick  Today after working at it his secretions are basically clear, still on the thick side    Fasting BG of 304 this morning, he did eat a stromboli last night  But then before lunch his BG was 273 and Tonya "does not let him have junk"  For the past week he has not had a fasting morning BG lower than 196 (304 was the highest)    BP being high this morning was "random" has been stable otherwise  HR has been stable, afebrile  Urine output has been extremely high this morning  Already had changed 3 briefs by the time writer spoke with [name] (usually he only gets changed 2x per shift)  Otherwise he seems to be at his baseline, mostly in a good mood    They are slated to get the nebulized toby tomorrow    also inquiring what his call parameter for high BG's should be  Has one note that says 250 and one that says 300  Will route to MD

## 2019-09-23 NOTE — Telephone Encounter (Signed)
[facility] TELEPHONE INTAKE    Reason for call: Patient's private duty nurse [name] reported the patient's BG was 304 this morning, and provider's instructions are to call PCP for a BG > 300.   Tonya reported patient's BP was also high this morning, it was 146/98.   She re-took patient's BG this afternoon and it was 139/88 and then 127/87.    [name] reported patient still has yellow secretions. She stated patient is supposed to receive his tobramycin delivery tomorrow.     Tonya requested to speak with a nurse or provider.     Name of caller: Tonya  Relationship to patient: PDN  Organization (if applicable): [facility]  Phone: [phone]

## 2019-09-24 ENCOUNTER — Telehealth: Payer: Self-pay | Admitting: Pediatrics

## 2019-09-24 NOTE — Telephone Encounter (Signed)
Angel Wells called requesting a call back to follow up on yesterday's conversation. Angel Wells also stated she would like to talk about patient's blood sugars and wanted to notify pcp that patient's tobramycin arrived. Angel Wells would also like to know what is the diagnoses for the tobramycin.   Angel Wells stated she would like a call back regarding this.   [phone]

## 2019-09-24 NOTE — Telephone Encounter (Signed)
Incoming paperwork for completion to the [facility]  Date Received: 09/24/2019    Type of paperwork (sender:form type): Revision to plan of care     Completed paperwork to be sent to:  Fax to [facility] [phone]    Special Requests:     Placed in box for: Lie

## 2019-09-25 NOTE — Addendum Note (Signed)
Addended by: Midge Minium on: 09/25/2019 10:35 PM     Modules accepted: Orders

## 2019-09-25 NOTE — Telephone Encounter (Signed)
Angel Wells with [facility] requesting to speak directly with [facility] regarding Angel Wells's glucose levels.  Requested call back

## 2019-09-25 NOTE — Telephone Encounter (Signed)
Spoke with [name]  Pt got nebulized tobi yesterday, has had about 3 doses  Secretions are yellow in the morning, then clear up throughout the day  BG was 258 this morning, thinks this is fasting but parents have a habit of feeding pt if he wakes in the night  Pt had a 3 minute seizure yesterday  Lung sounded "great" 2 days ago when she saw him (apart from some mild expiratory wheezes)  They are doing 75 of levemir  4 units BID with meals  4 units for BG of 200-250  6 units for a BG> 250    Per [name], she would like a consult with the dietician  To help guide the nurses on making some better choices with portion control  States dad hands them the plate of food for the pt, but they don't necessarily have to give the pt ALL of the food  Many nights he gets fast food    BG's might also be in response to "something brewing" in his sputum, but [name] feels pt is stable enough to see if the tobi makes a difference    Will route to MD to advise

## 2019-09-26 ENCOUNTER — Encounter: Payer: Self-pay | Admitting: Gastroenterology

## 2019-09-29 NOTE — Telephone Encounter (Signed)
RN, [name] is looking to speak with RD Britiney.  She states she spoke with [employer] and they reported on what to do in order to get supplies covered and she would like to provide this information. [name] states they are still in need of a new mic-key button. [name] has requested a return call.    Phone: [phone]

## 2019-09-30 ENCOUNTER — Other Ambulatory Visit: Payer: Self-pay | Admitting: Pediatrics

## 2019-09-30 DIAGNOSIS — J4540 Moderate persistent asthma, uncomplicated: Secondary | ICD-10-CM

## 2019-09-30 DIAGNOSIS — R569 Unspecified convulsions: Secondary | ICD-10-CM

## 2019-09-30 MED ORDER — VALPROATE SODIUM 250 MG/5ML PO SOLN *I*
250.0000 mg | Freq: Three times a day (TID) | ORAL | 1 refills | Status: DC
Start: 2019-09-30 — End: 2019-10-23

## 2019-09-30 MED ORDER — BUDESONIDE 0.25 MG/2ML IN SUSP *I*
0.2500 mg | Freq: Two times a day (BID) | RESPIRATORY_TRACT | 1 refills | Status: DC
Start: 2019-09-30 — End: 2020-03-25

## 2019-10-01 ENCOUNTER — Telehealth: Payer: Self-pay | Admitting: Oncology

## 2019-10-01 DIAGNOSIS — R093 Abnormal sputum: Secondary | ICD-10-CM

## 2019-10-01 NOTE — Addendum Note (Signed)
Addended by: Homero Fellers A on: 10/01/2019 06:03 PM     Modules accepted: Orders

## 2019-10-01 NOTE — Telephone Encounter (Addendum)
[facility]             Telephone Encounter     History   After hours on call     10/01/19   5:35 PM  Phone number: [phone] Shelly from [facility]  Reason for call: hyperglycemic, low o2, cool and clammy    1734: Called Shelly back, no answer went to voicemail.  Left hippa compliant voicemail to call office back.      1737: Called again; no answer- went to voicemail.  Will try again in 10 minutes.      1744: Paged again -returned call - Raisa RN  -BG was 300, gave 6 units - checked because cool and clammy given around 1720  -Face is flushed and feels clammy  -No fever; normothermic  -SpO2 94% (normal mid to high 90's) on room air  -HR 76  -Breathing looks comfortable- going to sleep  -Secretions are more thick and yellow creamy  -Looks stable to stay at home  -Current on tobi nebs for pseudomonas culture as maintenance  -No emesis,  -No urine symptoms  -No stool changes  -More tired today than usual  -Does not look ill enough to go to ED    Plan:  -Given PRN of insulin for random check due to flushed cheeks (appropriate to check, chart does not have PRN dose from my review) at 1720 however would be due for another dose  close to peak effect for dinner feed (1900)  -RN will recheck BG prior to dinner feed; if less than 200 will not given insulin, if >200 will call with BG result to discuss insulin dosing in attempt to prevent hypoglycemia overnight with levemir being due at 2200  -Booked for video appt tomorrow with myself  -Sputum culture ordered  -Discussed s/s of when to go to ED.        1958:  -BG pre dinner 266;  Give 2 units novolog  Give normal dose of levemir at 2200  Return to normal BG checks  [name] has perked back up  Keep 11:30 appointment tomorrow.      Palma Holter, NP

## 2019-10-02 ENCOUNTER — Ambulatory Visit: Payer: Medicare Other | Attending: Pediatrics | Admitting: Pediatrics

## 2019-10-02 ENCOUNTER — Encounter: Payer: Self-pay | Admitting: Pediatrics

## 2019-10-02 ENCOUNTER — Ambulatory Visit: Payer: Medicare Other | Admitting: Oncology

## 2019-10-02 ENCOUNTER — Other Ambulatory Visit
Admission: RE | Admit: 2019-10-02 | Discharge: 2019-10-02 | Disposition: A | Discharge status: Home / Self Care | Payer: Medicare Other | Source: Ambulatory Visit

## 2019-10-02 DIAGNOSIS — S069XAA Unspecified intracranial injury with loss of consciousness status unknown, initial encounter: Secondary | ICD-10-CM

## 2019-10-02 DIAGNOSIS — S069X9A Unspecified intracranial injury with loss of consciousness of unspecified duration, initial encounter: Secondary | ICD-10-CM | POA: Insufficient documentation

## 2019-10-02 DIAGNOSIS — R61 Generalized hyperhidrosis: Secondary | ICD-10-CM | POA: Insufficient documentation

## 2019-10-02 DIAGNOSIS — G40909 Epilepsy, unspecified, not intractable, without status epilepticus: Secondary | ICD-10-CM | POA: Insufficient documentation

## 2019-10-02 DIAGNOSIS — Z794 Long term (current) use of insulin: Secondary | ICD-10-CM | POA: Insufficient documentation

## 2019-10-02 DIAGNOSIS — R5383 Other fatigue: Secondary | ICD-10-CM | POA: Insufficient documentation

## 2019-10-02 DIAGNOSIS — E1165 Type 2 diabetes mellitus with hyperglycemia: Secondary | ICD-10-CM | POA: Insufficient documentation

## 2019-10-02 DIAGNOSIS — G8250 Quadriplegia, unspecified: Secondary | ICD-10-CM

## 2019-10-02 DIAGNOSIS — Z9911 Dependence on respirator [ventilator] status: Secondary | ICD-10-CM | POA: Insufficient documentation

## 2019-10-02 LAB — GRAM STAIN: Gram Stain: 0

## 2019-10-02 NOTE — Telephone Encounter (Signed)
Paperwork completed 10/02/2019  Sent as outlined in prior documentation

## 2019-10-02 NOTE — Progress Notes (Signed)
[place]    TELEMEDICINE VISIT  CHIEF COMPLAINT     Chief Complaint   Patient presents with    Diabetes     Subjective     TELEMEDICINE CONSENT     Visit being conducted by Video in lieu of a face to face visit to minimize health care worker and patient exposure to COVID-19, and conserve PPE.    Location of Patient: home  Location of Telemedicine Provider: hospital / clinical location  Other Participants in telemedicine encounter and roles: Kerri Asche (Dad), Darrick Penna (Nurse), Doretha Imus (Attending)    Consent was obtained from the patient to complete this telemedicine visit; including the potential for financial liability.  How did the patient provide consent? Form Completed    SUBJECTIVE     Hyperglycemic  - Yesterday felt flushed and clammy after a walk outside in black pants and a sweatshirt. This has improved today  - He also had some creamy secretions yesterday that have normalized today  - Yesterday after this episode his BG was 300 and received 6 units on insulin  - Today after his walk his BG was 286  - He received his levemir overnight of 70 units  - He received 5 units this morning and 6 units today with BGs still above 280  - Has a two step correctional scale without a nutritional baseline with 4 units between 200 and 250 and 6 units if over 250  - Still occasionally eating high carbohydrate meals including sweet potatoes with most meals for constipation, and sometimes eating pasta    MEDICATIONS     Current Outpatient Medications   Medication Sig    budesonide (PULMICORT) 0.25 MG/2ML nebulizer solution Inhale 2 mLs (0.25 mg total) by nebulization 2 times daily Dx: J45.909    valproate (VALPROIC ACID) 50 mg/mL syrup Take 5 mLs (250 mg total) by mouth 3 times daily    triamcinolone (KENALOG) 0.025 % cream APPLY TO AFFECTED AREA TWICE A DAY    Nebulizers (PARI LC PLUS VIOS PRO NEB) MISC Pari Vios Pro Neb or comparable with Nebulizer kits and nebulizer trach mask. Use twice daily  With albuterol.MCAID # [id number], MCARE #[id number]    fluticasone (FLONASE) 50 MCG/ACT nasal spray Spray 1 spray into nostril daily    tobramycin, PF, (TOBI) 300 MG/5ML nebulizer solution NEBULIZE 1 VIAL (300 MG) TWICE DAILY EVERY OTHER MONTH   DIAGNOSIS: E84.0    generic DME 2m Adapter for trach to be used as directed. Replace weekly. Dx.Z99.11    generic DME 15 mm adapters for nebulized therapies via trach  Medicare ID [id number] Medicaid ID [id number]   insulin detemir (LEVEMIR FLEXPEN) 100 UNIT/ML injection pen Inject 70 units into the skin nightly    nebulizer device with mask and tubing Use as directed    senna (SENOKOT) 8.6 MG tablet Administer 1 tablet via G tube daily Crush 1 tablet via G-tube daily.  Hold for loose stools.    B-D UF III MINI PEN NEEDLES 31G X 5 MM USE 3 (THREE) TIMES DAILY AS NEEDED.    famotidine (PEPCID) 20 MG tablet TAKE 1 TABLET BY MOUTH 2 (TWO) TIMES DAILY. OK TO CRUSH IT AND PUT IT DOWN TUBE IF NECESSARY    generic DME Please dispense one 561msyringe. Use as directed.    generic DME MCAID ID [id number] CUFFED TRACH 7.0 Please change trach every month and as needed.    Accu-Chek FastClix Lancets MISC 1 EACH BY  MISC.(NON-DRUG COMBO ROUTE) ROUTE 3 (THREE) TIMES DAILY. E11.65    blood glucose (ACCU-CHEK AVIVA PLUS) test strip USE 1 STRIP 3 (THREE) TIMES DAILY and  AS NEEDED.Dx: E11.65 and Z79.4    ipratropium (ATROVENT) 0.02 % nebulizer solution Inhale 2.5 mLs (500 mcg total) by nebulization 2 times daily    baclofen (LIORESAL) 10 MG tablet TAKE 1 TABLET BY MOUTH TWICE A DAY    generic DME Please dispense 86m measuring cup. Use as directed.    lactobacillus rhamnosus, GG, (CULTURELLE) capsule Take 1 capsule (1 each total) by mouth daily    Feeding Tubes - Sets (MIC-KEY GASTROSTOMY KIT 14FR) MISC Please dispense every 6 months and prn, MicKey 14Fr 2.5cm    Gauze Pads & Dressings (SPLIT GAUZE DRAINAGE SPONGE) 4"X4" Use once and PRN times a day as instructed.     gauze pads 2"X2" Use as directed around trach. MCAID ID [id number]   generic DME 10 isothermal adult "antiflect" connector. MCAID [id number] as directed.    Nebulizer MISC Disposable nebulizer kits to be used inline with trach  MCAID ID [id number]   generic DME Suction catheter with chimney valve.Use as directed.MCAID ID [id number]   albuterol (PROVENTIL) (2.5 mg/341m 0.083% nebulizer solution Take 3 mLs (2.5 mg total) by nebulization 2 times daily Give twice daily with vesting    bisacodyl (DULCOLAX) 10 MG suppository Please give suppository as needed  if no BM in 3 days, please check with Dad prior to giving    clonazePAM (KLONOPIN) 1 MG tablet TAKE 1 CRUSHED TABLET VIA G-TUBE AS NEEDED FOR CLUSTER SEIZURES, MAX 3 TABS BACK TO BACK    GUAIFENESIN PO Take 200 mg by mouth every 4 hours as needed (For cough or congestion)     generic DME Non-sterile gloves size medium Use as directed. MCAID ID [id number]/month    zinc oxide (DESITIN) 40 % paste Apply topically as needed for Diaper Rash    generic DME tRACH TIES WITH O2 PORT. Use as directed.MCAID ID [id number]  sodium chloride 0.9 % nebulizer solution Take 3 mLs by nebulization as needed for Wheezing MCAID ID [id number]  gauze sponge (CURITY) 4"X4" pads Use PRN as instructed.MCAID ID [id number]  generic DME PASSEY MUIR VALVE (PURPLE) Use as tolerated.MCAID ID [id number]  Adhesive Tape 1"x5yd TAPE By 1 Units no specified route as needed MCAID ID [id number]  generic DME 10 ml syringes Use as directed.MCAID ID [id number]  generic DME Trach care kits Use as directed.MCAID ID [id number]  surgical lubricant (SURGILUBE) gel Apply topically as needed MCAID ID [id number]  Distilled Water LIQD Distilled water, use as directedMCAID ID [id number]  generic DME Mepilex 4X 4. Use as directed.MCAID ID [id number]  generic DME HME for trach humidification Use as directed.MCAID ID [id number]  generic DME Cough Assist circuits Use as directed.MCAID ID  [id number]  generic DME Suction tubing Use as directed.MCAID ID [id number]  generic DME Suction canister Use as directed.MCAID ID [id number]  generic DME Suction filter Use as directed.MCAID ID [id number]  generic DME Nasal adapters Use as directed.MCAID ID [id number]  generic DME Pulse ox probes (disposable)Use as directed.MCAID ID [id number]  diphenhydrAMINE (BENADRYL) 25 MG oral solid 25 mg by Per G Tube route nightly as needed for Sleep    FIBER SELECT GUMMIES PO Take  by mouth Give 2 gummies daily    Multiple Vitamins-Minerals (HM MULTIVITAMIN ADULT GUMMY PO) Take by mouth Give 2 gummies daily    Ascorbic Acid (VITAMIN C ADULT GUMMIES PO) Take by mouth Give 2 gummies once daily    Cholecalciferol (VITAMIN D3) 50 MCG (2000 UT) capsule 2,000 Units by Per G Tube route daily    cetirizine (ZYRTEC) 1 MG/ML syrup 10 mg by Per G Tube route at bedtime    Insulin Aspart (NOVOLOG FLEXPEN SC) Inject 4 Units into the skin 3 times daily (before meals) FOR BG > 200. Alert MD for BG <70 and >250.     oxygen 2 l/m bleed through ventilator at night    Blood Glucose Monitoring Suppl (FIFTY50 GLUCOSE METER 2.0) w/Device KIT by Other route    EPINEPHrine 0.15 mg/0.3 mL auto-injector Inject 0.15 mg into the muscle    lactulose (LACTULOSE) 20 GM/30ML solution 10 g by Per G Tube route 3 times daily Hold dose for loose stools    levETIRAcetam (KEPPRA) 100 MG/ML solution GIVE 15 MLS BY PER G TUBE ROUTE 2 (TWO) TIMES DAILY.    miconazole (MICATIN) 2 % powder 2 times daily as needed     insulin pen needle (FIFTY50 PEN NEEDLES) 31G X 8 MM 1 each     Medications reviewed at today's visit       Objective     OBJECTIVE   PHYSICAL EXAM  This visit was performed during a pandemic event, and the physical exam was limited to my Video observation of this patient's organ systems and/or body areas.   GEN:  No acute distress       ASSESSMENT   Today we discussed the assessment and management of the following diagnosis:    ICD-10-CM  ICD-9-CM   1. TBI (traumatic brain injury)  S06.9X9A 854.00   2. 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 Type 2 diabetes mellitus with hyperglycemia, with long-term current use of insulin  E11.65 250.00    Z79.4 790.29     V58.67       PLAN      1. TBI (traumatic brain injury)  Chronic underlying condition decisions made with this in mind    2. Seizure disorder  Chronic underlying condition decisions made with this in mind    3. Spastic quadriplegia  Chronic underlying condition decisions made with this in mind    4. Type 2 diabetes mellitus with hyperglycemia, with long-term current use of insulin  - Will adjust sliding scale and add a nutritional baseline Aspart 2:50>200 with a nutritional baseline of 5  - Will schedule follow up with RD     Patient Instructions   Start baseline with meals of 5 units    Time:  Over 20 minutes were spent with the patient, >50% of the time was spent in counseling regarding the above problems.        21+  minutes was spent on the phone with the patient, patient representatives, and/or other attendees.     Coralee Rud, MD

## 2019-10-02 NOTE — Patient Instructions (Addendum)
Start nutritional  baseline with meals of 5 units + sliding scale   >200: BL + 2 units, >250: BL + 4 units, >300: BL + 6, >350: BL + 8 and call PCP office

## 2019-10-03 ENCOUNTER — Telehealth: Payer: Self-pay | Admitting: Pediatrics

## 2019-10-03 NOTE — Telephone Encounter (Signed)
[facility] TELEPHONE TRIAGE     Reason for call: [name] from [facility] would like a signed order for a sliding scale sent over. [name] would like a call back once this is done.     Name of caller:[name] for [name]  Relationship to patient: RN  Organization (if applicable): [facility]  Phone:  [phone]

## 2019-10-04 ENCOUNTER — Telehealth: Payer: Self-pay | Admitting: Internal Medicine

## 2019-10-04 LAB — AEROBIC CULTURE

## 2019-10-05 MED ORDER — NOVOLOG FLEXPEN 100 UNIT/ML SC SOPN
PEN_INJECTOR | SUBCUTANEOUS | 5 refills | Status: DC
Start: 2019-10-05 — End: 2020-02-19

## 2019-10-05 NOTE — Telephone Encounter (Signed)
I resent Rx to [facility], does [name] need a faxed order or is she going to send over a form that she normally does for me to sign?

## 2019-10-06 ENCOUNTER — Telehealth: Payer: Self-pay | Admitting: Pediatrics

## 2019-10-06 ENCOUNTER — Encounter: Payer: Self-pay | Admitting: Pediatrics

## 2019-10-06 NOTE — Telephone Encounter (Signed)
[facility] TELEPHONE INTAKE    Reason for call: Patient's PDN Burnett Harry reported that they called the on call provider on Saturday.  Caller stated they need patient's sliding scale updated because of his baseline and parameters. She requested to speak with a nurse about this.   Shelly requested to know if PCP will be prescribing an antibiotic based on patient's sputum culture.     She stated PDN [name] can be called tomorrow if she is unavailable.       Name of caller: Shelly  Relationship to patient: PDN  Phone: [phone]

## 2019-10-06 NOTE — Telephone Encounter (Signed)
Error

## 2019-10-07 NOTE — Telephone Encounter (Signed)
Writer faxed letter.  Received confirmation.

## 2019-10-07 NOTE — Telephone Encounter (Signed)
Spoke with [name]  Asking for the sliding scale to be put in a letter and faxed to [facility] at [facility] [phone])  [name] read writer the sliding scale that she has, which matches the last telemed visit notes from MD  Tanya expressing concern about giving the nutritional baseline of 5 at a meal is pts BG is "just over 70"  Discussed that insulin is given by nursing after meals, so even if pts BG is on the lower side, they can be aware of how much of his meal was consumed and confident that he has consumed adequate carbohydrate to handle the 5 units  Tanya verbalized understanding    Inquiring if MD is going to prescribe antibiotic now that sputum cultures have resulted  [name] states she is able to get pts secretions clear at times, but they have been yellow and thick  Doing airway clearance, he has desaturated a few times during these treatments (which is abnormal for him) but able to recover with suctioning  Oxygen has stayed above 90, but is lower than his baseline the past few days  He is eating, in a decent mood and afebrile    Also Tanya wanted MD to be aware that the date on covid vaccine card (which pt received at the [facility]) is his birthday not [birth year]  Writer informed [name] that the office can give him a new card when he gets dose number 2    Routing to MD to advise on antibiotics

## 2019-10-07 NOTE — Telephone Encounter (Signed)
Addressed in call from 3/29

## 2019-10-08 ENCOUNTER — Telehealth: Payer: Self-pay

## 2019-10-08 ENCOUNTER — Ambulatory Visit: Payer: Medicare Other | Admitting: Pediatrics

## 2019-10-08 ENCOUNTER — Other Ambulatory Visit: Payer: Self-pay | Admitting: Pediatrics

## 2019-10-08 ENCOUNTER — Telehealth: Payer: Self-pay | Admitting: Pediatrics

## 2019-10-08 DIAGNOSIS — E1165 Type 2 diabetes mellitus with hyperglycemia: Secondary | ICD-10-CM

## 2019-10-08 DIAGNOSIS — Z794 Long term (current) use of insulin: Secondary | ICD-10-CM

## 2019-10-08 MED ORDER — INSULIN DETEMIR 100 UNIT/ML SC SOPN *I*
75.0000 [IU] | PEN_INJECTOR | Freq: Every evening | SUBCUTANEOUS | 5 refills | Status: DC
Start: 2019-10-08 — End: 2019-10-23

## 2019-10-08 NOTE — Telephone Encounter (Signed)
Incoming paperwork for completion to the [facility]  Date Received: 10/08/2019    Type of paperwork (sender:form type): Revision to plan of care    Completed paperwork to be sent to:  Fax to [facility] [phone]    Special Requests:     Placed in box for: Lie

## 2019-10-08 NOTE — Telephone Encounter (Signed)
Spoke to [employer] nurse Dorisann Frames, we discussed what Miranda from [employer] needs to get josh's supplies covered. We also set up an appointment for 10/14/19 to do DM teaching. Zoom link was emailed. Will fax scripts, updated nutrition assessment/clinic notes and LOMN to [employer] [phone].

## 2019-10-09 NOTE — Telephone Encounter (Signed)
Please hold off on antibiotics as gram stain was negative so this staph may be a contaminant.  Please call if having fevers or symptoms of respiratory distress and we can reconsider,please fax over new letter as discussed.

## 2019-10-09 NOTE — Telephone Encounter (Signed)
Relayed MD's thoughts on holding off on antibiotic for now  Gave reasons office would like a call  [name] agreed    Went over insulin regimen, verbalized understanding  Will fax new letter to gail    Scheduled for next covid vaccine on Tuesday    No further concerns at this time

## 2019-10-09 NOTE — Telephone Encounter (Signed)
Patient's PDN [name] reported patient has blood in his secretions.   Caller stated that patient is positive for a staph infection and she is concerned that he should start an antibiotic.   [name] requested a return call from nursing staff or Lupita Leash.     Phone: [phone]

## 2019-10-09 NOTE — Telephone Encounter (Signed)
Since they are requesting antibiotics I will defer to nursing to call patient.

## 2019-10-09 NOTE — Telephone Encounter (Signed)
Spoke with [name]  Reports yesterday pt started having having some little bright red clots in his sputum  This is mostly in the morning  He has had an intermittent cough as well  Afebrile    Pt looks well  He is eating and drinking, currently playing cards with [name] and joking around  "you wouldn't know he was sick"  Asking if MD would like to start antibiotics based off of symptoms and cultures    [name] also has questions about the pts novolog insulin  Per last visit/letter they were instructed to start sliding scale+baseline at 200  Rx on insulin says to hold baseline insulin for BG of 70-200  From the visit, they thought they were to give the baseline starting at 70   Requesting clarification     Routing to MD

## 2019-10-10 ENCOUNTER — Encounter: Payer: Self-pay | Admitting: Gastroenterology

## 2019-10-10 LAB — LEGIONELLA CULTURE: Legionella Culture: 0

## 2019-10-10 NOTE — Telephone Encounter (Signed)
Writer faxed letter.  Received confirmation.

## 2019-10-14 ENCOUNTER — Ambulatory Visit: Payer: Medicare Other

## 2019-10-14 ENCOUNTER — Ambulatory Visit: Payer: Medicare Other | Attending: Pediatrics

## 2019-10-14 DIAGNOSIS — Z794 Long term (current) use of insulin: Secondary | ICD-10-CM | POA: Insufficient documentation

## 2019-10-14 DIAGNOSIS — Z931 Gastrostomy status: Secondary | ICD-10-CM | POA: Insufficient documentation

## 2019-10-14 DIAGNOSIS — E1165 Type 2 diabetes mellitus with hyperglycemia: Secondary | ICD-10-CM | POA: Insufficient documentation

## 2019-10-14 DIAGNOSIS — Z23 Encounter for immunization: Secondary | ICD-10-CM | POA: Insufficient documentation

## 2019-10-14 NOTE — Progress Notes (Signed)
[facility] Annual Nutrition Support Assessment     Pt referred to RD for: nutrition support [tube feed] maintenance  DM2  Allergies: NKFA  Intolerances: none  Appetite: good  How Fed: food PO, gets hydration through tube  Beverages/Hydration: drinks sips of thin liquids, gets most hydration through tube  Access to food: lives in [facility], staff and family provides food    Feeding ability: needs assistance at meals    Tube Feeding Regimen and Equipment:  Tube type: G 16 [place] mickey button  Pump type: gets bolus hydration   DME: prompt care  Last Tube Change: February   Where is Tube Changed: at house  Formula: no formula  Flushes: 1.5L fluid total daily    Nutrition-Relevant Medications: culturelle, levimir, novolog  Vitamins: gummy vitamins  Acid Reducer: famotidine  Medications are taken: 100% tube  At home BG: 190-300 -- checked before each meal, is now on sliding scale  levimir at night    Nutrition-Related Labs:  Results for Angel Wells, Angel Wells (MRN [id number]) as of 10/16/2019 11:02   Ref. Range 06/17/2019 07:00   Sodium Latest Ref Range: 133 - 145 mmol/L 134   Potassium Latest Ref Range: 3.3 - 5.1 mmol/L 4.3   Chloride Latest Ref Range: 96 - 108 mmol/L 96   CO2 Latest Ref Range: 20 - 28 mmol/L 24   Anion Gap Latest Ref Range: 7 - 16  14   UN Latest Ref Range: 6 - 20 mg/dL <4 (L)   Creatinine Latest Ref Range: 0.67 - 1.17 mg/dL 0.35 (L)   GFR,Black Latest Units: * 199   GFR,Caucasian Latest Units: * 172   Glucose Latest Ref Range: 60 - 99 mg/dL 121 (H)   Calcium Latest Ref Range: 9.0 - 10.3 mg/dL 9.5   Hemoglobin A1C Latest Units: % 6.4 (H)     Bowels: goes once every 3 days with suppository, lactulose, senna is not effective, will ask MD about bowel meds  Urine Output: light, no strong odors  Nausea/Emesis/Reflux: no issues  Skin/Wounds: none, redness around mickey button  Edema: none  Activity Level:low/sedentary  Mobility: uses a wheelchair  Activities/exercise: moves right side, can be  active    Anthropometrics:  Wt Readings from Last 3 Encounters:   10/14/19 75.2 kg (165 lb 12.8 oz)   01/21/19 70.6 kg (155 lb 9.6 oz)   02/18/18 83.9 kg (185 lb)     Estimated body mass index is 27.59 kg/m as calculated from the following:    Height as of 01/21/19: 1.651 m ('5\' 5"' ).    Weight as of an earlier encounter on 10/14/19: 75.2 kg (165 lb 12.8 oz).    Weight Status/Goal: Pt is overweight; gradual weight loss towards IBW desired.  Weight goal range:  150-160#    Estimated Nutrient Needs:  Energy: using actual BW and MSJ: RMR x 1.2 AF = 1954kcals  Protein: 87g (18% of kcals)  Fluid: 71m/kcal = 19510m   ASSESSMENT:  Met with Angel Wells, and Angel Wells staff/nurses today to discuss his carbohydrate intakes. We talked about what he is currently eating, and his need for hydration through the gtube. He is unable to meet his hydration needs PO. Angel Wells needs around 2000kcals per day, and 225g of carbohydrates (45% of calories) daily, 15 servings of CHO, 2-3 at each meal and snack. Right now he is eating frequent meals from 11am to 6pm (breakfast at 11am, lunch at 3pm, 6pm dinner), and gets a late snack with levimir at 9pm. Related to  bowels and fluid needs, increase daily total to meet at least 1949m. Will send carb counting information that we discussed, and personalized meal guide for Angel Wells to house RN.   Nutrition Risk Category: Moderate risk based on: uncontrolled DM, gtube  Goals: BG control  Plan: 2-3 servings of carbohydrates per meal and snack. Increase daily total of fluids to ~19021mvia gtube as it is medically necessary.  Monitoring and Evaluation: finger sticks/ labs  Follow Up Appointment: Return for nutrition visit annually or PRN.    Patient Instructions Given: yes emailed per request    BrJocelyn LamerRD  10/14/19 9:32 AM

## 2019-10-15 ENCOUNTER — Telehealth: Payer: Self-pay | Admitting: Pediatrics

## 2019-10-15 NOTE — Telephone Encounter (Signed)
[facility] TELEPHONE INTAKE    Reason for call: Patient's RN Misty Stanley reported they had an appointment with dietician yesterday ans she was supposed to send information via e-mail.   Misty Stanley stated she has not yet received the information and wanted to provider her e-mail address again.    E-mail: [email or web address]    Name of caller: Marthe Patch (if applicable): [facility]  Phone: [phone]

## 2019-10-16 ENCOUNTER — Telehealth: Payer: Self-pay | Admitting: Pediatrics

## 2019-10-16 NOTE — Telephone Encounter (Signed)
Paperwork completed 10/16/2019  Sent as outlined in prior documentation

## 2019-10-16 NOTE — Telephone Encounter (Signed)
[facility] TELEPHONE TRIAGE     Reason for call: [name], RN called reporting that for sometime patient has been wheezing. [name] also states that patient is having yellow and red secretions. [name] would like a call back as soon as possible.    Name of caller:[name] for [name]  Relationship to patient: RN:   Phone:  [phone]

## 2019-10-16 NOTE — Telephone Encounter (Signed)
[facility] TELEPHONE TRIAGE     Reason for call: Tonya patient's RN called inquiring about the paperwork RD was suppose to fill out for medicaid. [name] would like a call back from RD regarding this.     Name of caller:[name] for Angel Wells  Relationship to patient: RN  Phone:  [phone]

## 2019-10-17 ENCOUNTER — Telehealth: Payer: Self-pay | Admitting: Pediatrics

## 2019-10-17 ENCOUNTER — Ambulatory Visit: Payer: Medicare Other | Attending: Oncology | Admitting: Oncology

## 2019-10-17 DIAGNOSIS — Z8701 Personal history of pneumonia (recurrent): Secondary | ICD-10-CM | POA: Insufficient documentation

## 2019-10-17 DIAGNOSIS — Z9911 Dependence on respirator [ventilator] status: Secondary | ICD-10-CM

## 2019-10-17 DIAGNOSIS — J45909 Unspecified asthma, uncomplicated: Secondary | ICD-10-CM

## 2019-10-17 MED ORDER — DOXYCYCLINE HYCLATE 100 MG PO TABS *I*
100.0000 mg | ORAL_TABLET | Freq: Two times a day (BID) | ORAL | 0 refills | Status: AC
Start: 2019-10-17 — End: 2019-10-24

## 2019-10-17 MED ORDER — CIPROFLOXACIN HCL 500 MG PO TABS *I*
500.0000 mg | ORAL_TABLET | Freq: Two times a day (BID) | ORAL | 0 refills | Status: AC
Start: 2019-10-17 — End: 2019-10-24

## 2019-10-17 MED ORDER — DIPHENHYDRAMINE HCL 12.5 MG/5ML PO WRAPPED *I*
25.0000 mg | Freq: Two times a day (BID) | 0 refills | Status: AC
Start: 2019-10-17 — End: 2019-10-24

## 2019-10-17 NOTE — Telephone Encounter (Signed)
Called and spoke with Angel Wells  She is at the office, directed Clinical research associate to call and speak with Angel Wells, immediately Primary school teacher to call Angel Wells explaining that gail had instructed writer to call Angel Wells regarding booking a video visit for this afternoon   Angel Wells stating she will be gone at 4 pm  pts parents declining doing a visit

## 2019-10-17 NOTE — Telephone Encounter (Signed)
Video appointment booked this afternoon with NP

## 2019-10-17 NOTE — Telephone Encounter (Signed)
[facility] TELEPHONE TRIAGE     Reason for call: [name] from [facility], called reporting that patient has been having blood clots that are about a centimeter. [name] states that patient had quite a few in the morning and she would like some guidance on what to do.     [name] is sending pictures of patient's blood clots to [facility] email and would like a call back regarding this.    Name of caller:[name] for [name]  Relationship to patient: RN  Organization (if applicable): [facility]  Phone: [phone]

## 2019-10-17 NOTE — Telephone Encounter (Signed)
Spoke to [employer] now has had symptoms for over a week. He is experiencing increased secretions, more yellow secretions, some blood from trach when suctioning.They feel we have watched long enough with no improvement and would like antibiotics ordered. Last on Cipro and Doxy. Staph in sputum culture.  Staff will attempt to use cough assist more to relieve suctioning to allow trach site to heal. They will continue 3-4 times a day albuterol, saline, vest, cough assist and suction only the lumen of the trach tube if possible. [facility]. Pills will be crushed and put through g-tube.

## 2019-10-17 NOTE — Progress Notes (Signed)
[place] COMPLAINT     Chief Complaint   Patient presents with    Wheezing       Subjective     TELEMEDICINE CONSENT     Visit being conducted by Video in lieu of a face to face visit to minimize health care worker and patient exposure to COVID-19, and conserve PPE.    Location of Patient: home  Location of Telemedicine Provider: hospital / clinical location  Other Participants in telemedicine encounter and roles: bedside nurse and dad    Consent was obtained from the patient to complete this telemedicine visit; including the potential for financial liability.  How did the patient provide consent? Verbal Consent Only    SUBJECTIVE     Wheezing:  -Over the course of the last week has been having some bloody secretions   -Little clots within the secretions  -Secretions of yellow and odorous  -This has happened before  -Increased wheezing , front and back - expiratory wheezing  -Had 10 second seizure this morning (normal) but was on the vent and had increased yellow secretions  -Sounded like he was drowing in the secretions  -Used cough assist at this time and it worked ok  -Secretions sometimes have white in them  -had COVID shot Tuesday  -These symptoms started before the covid shot  -Working hard to breath (using accessory muscles) but subsides at that time  -Working harder to breath when it's time for his treatment- which is normal for him   -Doing airway clearance (cough assist with saline treatments BID) and PRN   -Tobramycin neb therapy using this month  -Desatting with treatments- down into the high 80s but resolves with suctioning   -Dark brown clots now  -Never had just blood coming out  -Dad says that this time of year this happens all the time -   -Also says that he has been drinking red and pink drinks and this will show up in his secretions  -Dad feels this is normal for this time of year      Review of Systems   Constitutional: Negative for fever.    HENT: Negative for congestion.         Negative for nasal drainage   Respiratory: Positive for sputum production and wheezing. Negative for hemoptysis.          MEDICATIONS     Current Outpatient Medications   Medication Sig    diphenhydrAMINE (DIPHENHYDRAMINE) 12.5 mg/5 mL elixir Take 10 mLs (25 mg total) by mouth 2 times daily for 7 days    ciprofloxacin (CIPRO) 500 MG tablet Take 1 tablet (500 mg total) by mouth 2 times daily for 7 days    doxycycline hyclate (VIBRA-TABS) 100 MG tablet Take 1 tablet (100 mg total) by mouth 2 times daily for 7 days    insulin detemir (LEVEMIR FLEXPEN) 100 UNIT/ML injection pen Inject 75 units into the skin nightly    insulin aspart (NOVOLOG FLEXPEN) 100 UNIT/ML injection pen Inject SQ before meals: Nutritional BL: 5 units + SS as follows: <70: hold BL, 70-200: BL, > 200: BL+2, >250: BL+4, >300: BL+6, >350: BL +8. Alert MD for BG <70 and >350.    budesonide (PULMICORT) 0.25 MG/2ML nebulizer solution Inhale 2 mLs (0.25 mg total) by nebulization 2 times daily Dx: J45.909    valproate (VALPROIC ACID) 50 mg/mL syrup Take 5 mLs (250 mg total) by mouth 3 times daily  triamcinolone (KENALOG) 0.025 % cream APPLY TO AFFECTED AREA TWICE A DAY    Nebulizers (PARI LC PLUS VIOS PRO NEB) MISC Pari Vios Pro Neb or comparable with Nebulizer kits and nebulizer trach mask. Use twice daily With albuterol.MCAID # [id number], MCARE #[id number]    fluticasone (FLONASE) 50 MCG/ACT nasal spray Spray 1 spray into nostril daily    tobramycin, PF, (TOBI) 300 MG/5ML nebulizer solution NEBULIZE 1 VIAL (300 MG) TWICE DAILY EVERY OTHER MONTH   DIAGNOSIS: E84.0    generic DME 36m Adapter for trach to be used as directed. Replace weekly. Dx.Z99.11    generic DME 15 mm adapters for nebulized therapies via trach  Medicare ID [id number] Medicaid ID [id number]   nebulizer device with mask and tubing Use as directed    senna (SENOKOT) 8.6 MG tablet Administer 1 tablet via G tube daily Crush 1 tablet  via G-tube daily.  Hold for loose stools.    B-D UF III MINI PEN NEEDLES 31G X 5 MM USE 3 (THREE) TIMES DAILY AS NEEDED.    famotidine (PEPCID) 20 MG tablet TAKE 1 TABLET BY MOUTH 2 (TWO) TIMES DAILY. OK TO CRUSH IT AND PUT IT DOWN TUBE IF NECESSARY    generic DME Please dispense one 526msyringe. Use as directed.    generic DME MCAID ID [id number] CUFFED TRACH 7.0 Please change trach every month and as needed.    Accu-Chek FastClix Lancets MISC 1 EACH BY MISC.(NON-DRUG COMBO ROUTE) ROUTE 3 (THREE) TIMES DAILY. E11.65    blood glucose (ACCU-CHEK AVIVA PLUS) test strip USE 1 STRIP 3 (THREE) TIMES DAILY and  AS NEEDED.Dx: E11.65 and Z79.4    ipratropium (ATROVENT) 0.02 % nebulizer solution Inhale 2.5 mLs (500 mcg total) by nebulization 2 times daily    baclofen (LIORESAL) 10 MG tablet TAKE 1 TABLET BY MOUTH TWICE A DAY    generic DME Please dispense 3052measuring cup. Use as directed.    lactobacillus rhamnosus, GG, (CULTURELLE) capsule Take 1 capsule (1 each total) by mouth daily    Feeding Tubes - Sets (MIC-KEY GASTROSTOMY KIT 14FR) MISC Please dispense every 6 months and prn, MicKey 14Fr 2.5cm    Gauze Pads & Dressings (SPLIT GAUZE DRAINAGE SPONGE) 4"X4" Use once and PRN times a day as instructed.    gauze pads 2"X2" Use as directed around trach. MCAID ID [id number] generic DME 10 isothermal adult "antiflect" connector. MCAID [id number] as directed.    Nebulizer MISC Disposable nebulizer kits to be used inline with trach  MCAID ID [id number] generic DME Suction catheter with chimney valve.Use as directed.MCAID ID [id number] albuterol (PROVENTIL) (2.5 mg/3mL50m.083% nebulizer solution Take 3 mLs (2.5 mg total) by nebulization 2 times daily Give twice daily with vesting    bisacodyl (DULCOLAX) 10 MG suppository Please give suppository as needed  if no BM in 3 days, please check with Dad prior to giving    clonazePAM (KLONOPIN) 1 MG tablet TAKE 1 CRUSHED TABLET VIA G-TUBE AS NEEDED FOR  CLUSTER SEIZURES, MAX 3 TABS BACK TO BACK    GUAIFENESIN PO Take 200 mg by mouth every 4 hours as needed (For cough or congestion)     generic DME Non-sterile gloves size medium Use as directed. MCAID ID [id number]/month    zinc oxide (DESITIN) 40 % paste Apply topically as needed for Diaper Rash    generic DME tRACH TIES WITH O2 PORT. Use as directed.MCAID ID [id number]  sodium chloride 0.9 % nebulizer solution Take 3 mLs by nebulization as needed for Wheezing MCAID ID [id number]    gauze sponge (CURITY) 4"X4" pads Use PRN as instructed.MCAID ID [id number]    generic DME PASSEY MUIR VALVE (PURPLE) Use as tolerated.MCAID ID [id number]    Adhesive Tape 1"x5yd TAPE By 1 Units no specified route as needed MCAID ID [id number]    generic DME 10 ml syringes Use as directed.MCAID ID [id number]    generic DME Trach care kits Use as directed.MCAID ID [id number]    surgical lubricant (SURGILUBE) gel Apply topically as needed MCAID ID [id number]    Distilled Water LIQD Distilled water, use as directedMCAID ID [id number]    generic DME Mepilex 4X 4. Use as directed.MCAID ID [id number]    generic DME HME for trach humidification Use as directed.MCAID ID [id number]    generic DME Cough Assist circuits Use as directed.MCAID ID [id number]    generic DME Suction tubing Use as directed.MCAID ID [id number]    generic DME Suction canister Use as directed.MCAID ID [id number]    generic DME Suction filter Use as directed.MCAID ID [id number]    generic DME Nasal adapters Use as directed.MCAID ID [id number]    generic DME Pulse ox probes (disposable)Use as directed.MCAID ID [id number]    diphenhydrAMINE (BENADRYL) 25 MG oral solid 25 mg by Per G Tube route nightly as needed for Sleep    FIBER SELECT GUMMIES PO Take by mouth Give 2 gummies daily    Multiple Vitamins-Minerals (HM MULTIVITAMIN ADULT GUMMY PO) Take by mouth Give 2 gummies daily    Ascorbic Acid (VITAMIN C ADULT GUMMIES PO) Take by mouth Give 2 gummies once daily     Cholecalciferol (VITAMIN D3) 50 MCG (2000 UT) capsule 2,000 Units by Per G Tube route daily    cetirizine (ZYRTEC) 1 MG/ML syrup 10 mg by Per G Tube route at bedtime    oxygen 2 l/m bleed through ventilator at night    Blood Glucose Monitoring Suppl (FIFTY50 GLUCOSE METER 2.0) w/Device KIT by Other route    EPINEPHrine 0.15 mg/0.3 mL auto-injector Inject 0.15 mg into the muscle    lactulose (LACTULOSE) 20 GM/30ML solution 10 g by Per G Tube route 3 times daily Hold dose for loose stools    levETIRAcetam (KEPPRA) 100 MG/ML solution GIVE 15 MLS BY PER G TUBE ROUTE 2 (TWO) TIMES DAILY.    miconazole (MICATIN) 2 % powder 2 times daily as needed     insulin pen needle (FIFTY50 PEN NEEDLES) 31G X 8 MM 1 each     Medications reviewed at today's visit       Objective     OBJECTIVE   PHYSICAL EXAM  This visit was performed during a pandemic event, and the physical exam was limited to my Video observation of this patient's organ systems and/or body areas.   GEN:  Awake and alert  No respiratory distress      Physical Exam  Constitutional:       Appearance: Normal appearance.   Eyes:      General: No scleral icterus.  Pulmonary:      Effort: Pulmonary effort is normal. No respiratory distress.   Abdominal:      General: There is no distension.   Neurological:      Mental Status: He is alert.              ASSESSMENT   Today we discussed the assessment and management of the following diagnosis:  ICD-10-CM ICD-9-CM   1. H/O pneumonia due to Pseudomonas  Z87.01 V12.61   2. Ventilator dependence  Z99.11 V46.11   3. Asthma  J45.909 493.90       PLAN      1. H/O pneumonia due to Pseudomonas  -Mixed symptoms that are difficult to determine cause  Some are normal symptoms for [name] while others are different- sputum changes could certainly per Dad's report could be allergies and from eating/drinking pink and red beverages or could be pneumonia since they are thick and have an odor to them that is not normal  -Will obtain a  trach culture  -Given that we're going into the weekend, will proactively prescribe antibiotics to only be started if develops fever or nurses are concerned pneumonia Is developing  -Family will let us know if they needed to start antibiotics over the weekend, otherwise will await culture data to see if [name] needs to be treated for pneumonia  -Increase CPT to 3-4 times a day if tolerated with nebs for intermittent wheezing   - Aerobic culture and gram stain (Cf respiratory- Sputum); Future    2. Ventilator dependence  Underlying condition, all treatment decisions made with this in consideration       3. Asthma  Underlying condition, all treatment decisions made with this in consideration            Orders Placed This Encounter    Aerobic culture and gram stain (Cf respiratory- Sputum)    diphenhydrAMINE (DIPHENHYDRAMINE) 12.5 mg/5 mL elixir    ciprofloxacin (CIPRO) 500 MG tablet    doxycycline hyclate (VIBRA-TABS) 100 MG tablet       Patient Instructions   Use albuterol PRN for wheezing 3.4 x a day  Safe suction reviewed  See donnas note  Culture today  Antibiotics incase needed over the weekend  Follow up next week if culture results  Will call if needing to start antibiotics  Reviewed when to go to ED             Time: I personally spent 40 minutes on the calendar day of the encounter, including pre and post visit work.      No follow-ups on file.      Phylis Bougie, NP

## 2019-10-17 NOTE — Telephone Encounter (Signed)
I would recommend a urgent telemedicine visit today going into the weekend to review clearance, suctioning, and antibioitcs.  It looks like Angel Wells has appt at 4pm.    [name], MD

## 2019-10-17 NOTE — Telephone Encounter (Signed)
Writer spoke with Angel Wells.   Patient is scheduled to do video appointment at 4 with NP [facility].  Writer sent video information to [employer].

## 2019-10-17 NOTE — Patient Instructions (Addendum)
Use albuterol PRN for wheezing 3.4 x a day  Safe suction reviewed  See donnas note  Culture today  Antibiotics incase needed over the weekend  Follow up next week if culture results  Will call if needing to start antibiotics  Reviewed when to go to ED

## 2019-10-20 ENCOUNTER — Encounter: Payer: Self-pay | Admitting: Gastroenterology

## 2019-10-21 ENCOUNTER — Telehealth: Payer: Self-pay | Admitting: Pediatrics

## 2019-10-21 DIAGNOSIS — J9611 Chronic respiratory failure with hypoxia: Secondary | ICD-10-CM

## 2019-10-21 DIAGNOSIS — Z931 Gastrostomy status: Secondary | ICD-10-CM

## 2019-10-21 DIAGNOSIS — G40909 Epilepsy, unspecified, not intractable, without status epilepticus: Secondary | ICD-10-CM

## 2019-10-21 DIAGNOSIS — S069XAA Unspecified intracranial injury with loss of consciousness status unknown, initial encounter: Secondary | ICD-10-CM

## 2019-10-21 DIAGNOSIS — Z9189 Other specified personal risk factors, not elsewhere classified: Secondary | ICD-10-CM

## 2019-10-21 DIAGNOSIS — J9602 Acute respiratory failure with hypercapnia: Secondary | ICD-10-CM

## 2019-10-21 DIAGNOSIS — S069X9A Unspecified intracranial injury with loss of consciousness of unspecified duration, initial encounter: Secondary | ICD-10-CM

## 2019-10-21 DIAGNOSIS — Z930 Tracheostomy status: Secondary | ICD-10-CM

## 2019-10-21 DIAGNOSIS — Z9911 Dependence on respirator [ventilator] status: Secondary | ICD-10-CM

## 2019-10-21 MED ORDER — SYRINGE DISPOSABLE 60 ML MISC *A*
5 refills | Status: DC
Start: 2019-10-21 — End: 2020-10-22

## 2019-10-21 MED ORDER — MIC-KEY GASTROSTOMY BOLUS FEED EXTENSION 24IN 60CM MISC *A*
5 refills | Status: DC
Start: 2019-10-21 — End: 2023-11-28

## 2019-10-21 MED ORDER — GENERIC DME *A*
5 refills | Status: DC
Start: 2019-10-21 — End: 2020-12-30

## 2019-10-21 MED ORDER — GAUZE PADS & DRESSINGS 2"X2" PADS *A*
MEDICATED_PAD | 5 refills | Status: DC
Start: 2019-10-21 — End: 2022-06-23

## 2019-10-21 MED ORDER — MIC-KEY GASTROSTOMY KIT 14FR MISC *A*
3 refills | Status: DC
Start: 2019-10-21 — End: 2020-06-22

## 2019-10-21 NOTE — Telephone Encounter (Signed)
-----   Message from [facility] [place], [place] sent at 10/21/2019  2:57 PM EDT -----  I spoke to Bayard, and she needs scripts for the following with dx or ICD10 codes:     4 extensions tubes per month  60cc syringe, 30 per month  2 boxes of split gauze 2x2  Tape  1 mickey button every 3 months    Fax to [facility] at [place] [phone]. I have already faxed clinic notes and LOMN for mic-key.

## 2019-10-21 NOTE — Telephone Encounter (Signed)
RN for patient Angel Wells requested to speak with patient's dietician.   Phone: [phone]

## 2019-10-22 ENCOUNTER — Other Ambulatory Visit
Admission: RE | Admit: 2019-10-22 | Discharge: 2019-10-22 | Disposition: A | Discharge status: Home / Self Care | Payer: Medicare Other | Source: Ambulatory Visit | Attending: Pediatrics | Admitting: Pediatrics

## 2019-10-22 DIAGNOSIS — R05 Cough: Secondary | ICD-10-CM | POA: Insufficient documentation

## 2019-10-22 LAB — GRAM STAIN

## 2019-10-23 ENCOUNTER — Other Ambulatory Visit: Payer: Self-pay | Admitting: Pediatrics

## 2019-10-23 ENCOUNTER — Ambulatory Visit: Payer: Medicare Other | Attending: Pediatrics | Admitting: Pediatrics

## 2019-10-23 DIAGNOSIS — R569 Unspecified convulsions: Secondary | ICD-10-CM

## 2019-10-23 DIAGNOSIS — S069XAA Unspecified intracranial injury with loss of consciousness status unknown, initial encounter: Secondary | ICD-10-CM

## 2019-10-23 DIAGNOSIS — Z794 Long term (current) use of insulin: Secondary | ICD-10-CM | POA: Insufficient documentation

## 2019-10-23 DIAGNOSIS — S069X9A Unspecified intracranial injury with loss of consciousness of unspecified duration, initial encounter: Secondary | ICD-10-CM

## 2019-10-23 DIAGNOSIS — E1165 Type 2 diabetes mellitus with hyperglycemia: Secondary | ICD-10-CM | POA: Insufficient documentation

## 2019-10-23 DIAGNOSIS — Z9911 Dependence on respirator [ventilator] status: Secondary | ICD-10-CM | POA: Insufficient documentation

## 2019-10-23 DIAGNOSIS — G40909 Epilepsy, unspecified, not intractable, without status epilepticus: Secondary | ICD-10-CM | POA: Insufficient documentation

## 2019-10-23 MED ORDER — VALPROATE SODIUM 250 MG/5ML PO SOLN *I*
250.0000 mg | Freq: Three times a day (TID) | ORAL | 1 refills | Status: DC
Start: 2019-10-23 — End: 2020-04-05

## 2019-10-23 MED ORDER — INSULIN DETEMIR 100 UNIT/ML SC SOPN *I*
80.0000 [IU] | PEN_INJECTOR | Freq: Every evening | SUBCUTANEOUS | 5 refills | Status: DC
Start: 2019-10-23 — End: 2020-02-11

## 2019-10-23 NOTE — Progress Notes (Signed)
[place]    TELEMEDICINE VISIT  CHIEF COMPLAINT     Chief Complaint   Patient presents with    Diabetes       Subjective     TELEMEDICINE CONSENT     Visit being conducted by Video in lieu of a face to face visit to minimize health care worker and patient exposure to COVID-19, and conserve PPE.    Location of Patient: home  Location of Telemedicine Provider: hospital / clinical location  Other Participants in telemedicine encounter and roles: bedside nurse and dad    Consent was obtained from the patient to complete this telemedicine visit; including the potential for financial liability.  How did the patient provide consent? Form completed    SUBJECTIVE     Wheezing/respiratory issues:   O2 sats during treatments have been going down to 82-84%  AM O2 sats have been in the low 90s -  This is typical recently for him in the morning  Currently 95%  Seems more sleepy at the times that his saturations drop  Last week was having some blood in his secretions (small clots and bright red streaks) - abnormal for him  Did have cough, none today  Low grade fevers this week, Tmax  99.43F  Have not used any of the antibiotics   Left sputum sample yesterday   - so far has grown staph aureus   - results not finalized   Have been doing prn chest PT    T2DM  BGs 200+ at breakfast  BGs 100-200s at lunch      Review of Systems   Constitutional: Negative for fever.   HENT: Negative for congestion.         Negative for nasal drainage   Respiratory: Positive for sputum production and wheezing. Negative for hemoptysis.          MEDICATIONS     Current Outpatient Medications   Medication Sig    ciprofloxacin (CIPRO) 500 MG tablet TAKE 1 TAB TWICE DAILY per gtube    lamoTRIgine (LAMICTAL) 25 MG tablet Administer 1 tablet (25 mg total) via G tube daily 1 TAB PER TUBE EVERY OTHER DAY X2WEEKS, THEN 1 TAB EVERY DAY X2WEEKS, THEN CONTACT DR. MAROLDO    senna (SENOKOT) 8.6 MG tablet Administer 1 tablet via G tube daily  Crush 1 tablet via G-tube daily.  Hold for loose stools.    generic DME [employer] for use in wheelchair, dispense 1. Use as directed. Dx G80.0, S06.9x9, LON: lifetime    docusate sodium (COLACE) 10 mg/mL liquid Take 10 mLs (100 mg total) by mouth 2 times daily    sennosides (SENOKOT) 8.8 mg/25m syrup Take 10 mLs by mouth nightly HOLD for diarrhea    ipratropium (ATROVENT) 0.02 % nebulizer solution Inhale 2.5 mLs (500 mcg total) by nebulization 2 times daily Dx: J45.4    albuterol (PROVENTIL) (2.5 mg/365m 0.083% nebulizer solution Inhale 3 mLs (2.5 mg total) by nebulization 2 times daily Give twice daily with vesting    ibuprofen (ADVIL,MOTRIN) 600 MG tablet Take 1 tablet (600 mg total) by mouth 4 times daily as needed for Pain for up to 30 doses    B-D UF III MINI PEN NEEDLES 31G X 5 MM USE 3 (THREE) TIMES DAILY AS NEEDED.    baclofen (LIORESAL) 10 MG tablet TAKE 1 TABLET BY MOUTH TWICE A DAY    triamcinolone (KENALOG) 0.025 % cream Apply topically 2 times daily APPLY TO AFFECTED  AREA    valproate (VALPROIC ACID) 50 mg/mL syrup Take 5 mLs (250 mg total) by mouth 3 times daily    insulin detemir (LEVEMIR FLEXPEN) 100 UNIT/ML injection pen Inject 80 units into the skin nightly    MIC-KEY gastrostomy feeding tube extension set 24" Use as directed, please dispense 4 per month    Syringe Disposable 60 ML MISC Use as directed    MIC-KEY gastrostomy kit 14FR Please dispense every 3 months and prn, MicKey 14Fr 2.5cm    gauze pads 2"X2" Use as directed around trach. MCAID ID [id number]    generic DME Adhesive tape 1" x 5 yards. Use as directed. Disp 3 box    insulin aspart (NOVOLOG FLEXPEN) 100 UNIT/ML injection pen Inject SQ before meals: Nutritional BL: 5 units + SS as follows: <70: hold BL, 70-200: BL, > 200: BL+2, >250: BL+4, >300: BL+6, >350: BL +8. Alert MD for BG <70 and >350.    budesonide (PULMICORT) 0.25 MG/2ML nebulizer solution Inhale 2 mLs (0.25 mg total) by nebulization 2 times daily Dx:  J45.909    Nebulizers (PARI LC PLUS VIOS PRO NEB) MISC Pari Vios Pro Neb or comparable with Nebulizer kits and nebulizer trach mask. Use twice daily With albuterol.MCAID # [id number], MCARE #[id number]    fluticasone (FLONASE) 50 MCG/ACT nasal spray Spray 1 spray into nostril daily    tobramycin, PF, (TOBI) 300 MG/5ML nebulizer solution NEBULIZE 1 VIAL (300 MG) TWICE DAILY EVERY OTHER MONTH   DIAGNOSIS: E84.0    generic DME 32m Adapter for trach to be used as directed. Replace weekly. Dx.Z99.11    generic DME 15 mm adapters for nebulized therapies via trach  Medicare ID [id number] Medicaid ID [id number]   nebulizer device with mask and tubing Use as directed    famotidine (PEPCID) 20 MG tablet TAKE 1 TABLET BY MOUTH 2 (TWO) TIMES DAILY. OK TO CRUSH IT AND PUT IT DOWN TUBE IF NECESSARY    generic DME Please dispense one 580msyringe. Use as directed.    generic DME MCAID ID [id number] CUFFED TRACH 7.0 Please change trach every month and as needed.    Accu-Chek FastClix Lancets MISC 1 EACH BY MISC.(NON-DRUG COMBO ROUTE) ROUTE 3 (THREE) TIMES DAILY. E11.65    blood glucose (ACCU-CHEK AVIVA PLUS) test strip USE 1 STRIP 3 (THREE) TIMES DAILY and  AS NEEDED.Dx: E11.65 and Z79.4    generic DME Please dispense 3026measuring cup. Use as directed.    lactobacillus rhamnosus, GG, (CULTURELLE) capsule Take 1 capsule (1 each total) by mouth daily    Gauze Pads & Dressings (SPLIT GAUZE DRAINAGE SPONGE) 4"X4" Use once and PRN times a day as instructed.    generic DME 10 isothermal adult "antiflect" connector. MCAID [id number] as directed.    Nebulizer MISC Disposable nebulizer kits to be used inline with trach  MCAID ID [id number] generic DME Suction catheter with chimney valve.Use as directed.MCAID ID [id number] bisacodyl (DULCOLAX) 10 MG suppository Please give suppository as needed  if no BM in 3 days, please check with Dad prior to giving    clonazePAM (KLONOPIN) 1 MG tablet TAKE 1 CRUSHED TABLET  VIA G-TUBE AS NEEDED FOR CLUSTER SEIZURES, MAX 3 TABS BACK TO BACK    GUAIFENESIN PO Take 200 mg by mouth every 4 hours as needed (For cough or congestion)     generic DME Non-sterile gloves size medium Use as directed. MCAID ID [id number]/month    zinc  oxide (DESITIN) 40 % paste Apply topically as needed for Diaper Rash    generic DME tRACH TIES WITH O2 PORT. Use as directed.MCAID ID [id number]    sodium chloride 0.9 % nebulizer solution Take 3 mLs by nebulization as needed for Wheezing MCAID ID [id number]    gauze sponge (CURITY) 4"X4" pads Use PRN as instructed.MCAID ID [id number]    generic DME PASSEY MUIR VALVE (PURPLE) Use as tolerated.MCAID ID [id number]    Adhesive Tape 1"x5yd TAPE By 1 Units no specified route as needed MCAID ID [id number]    generic DME 10 ml syringes Use as directed.MCAID ID [id number]    generic DME Trach care kits Use as directed.MCAID ID [id number]    surgical lubricant (SURGILUBE) gel Apply topically as needed MCAID ID [id number]    Distilled Water LIQD Distilled water, use as directedMCAID ID [id number]    generic DME Mepilex 4X 4. Use as directed.MCAID ID [id number]    generic DME HME for trach humidification Use as directed.MCAID ID [id number]    generic DME Cough Assist circuits Use as directed.MCAID ID [id number]    generic DME Suction tubing Use as directed.MCAID ID [id number]    generic DME Suction canister Use as directed.MCAID ID [id number]    generic DME Suction filter Use as directed.MCAID ID [id number]    generic DME Nasal adapters Use as directed.MCAID ID [id number]    generic DME Pulse ox probes (disposable)Use as directed.MCAID ID [id number]    diphenhydrAMINE (BENADRYL) 25 MG oral solid 25 mg by Per G Tube route nightly as needed for Sleep    FIBER SELECT GUMMIES PO Take by mouth Give 2 gummies daily    Multiple Vitamins-Minerals (HM MULTIVITAMIN ADULT GUMMY PO) Take by mouth Give 2 gummies daily    Ascorbic Acid (VITAMIN C ADULT GUMMIES PO) Take by mouth Give 2 gummies  once daily    Cholecalciferol (VITAMIN D3) 50 MCG (2000 UT) capsule 2,000 Units by Per G Tube route daily    cetirizine (ZYRTEC) 1 MG/ML syrup 10 mg by Per G Tube route at bedtime    oxygen 2 l/m bleed through ventilator at night    Blood Glucose Monitoring Suppl (FIFTY50 GLUCOSE METER 2.0) w/Device KIT by Other route    EPINEPHrine 0.15 mg/0.3 mL auto-injector Inject 0.15 mg into the muscle    lactulose (LACTULOSE) 20 GM/30ML solution 10 g by Per G Tube route 3 times daily Hold dose for loose stools    levETIRAcetam (KEPPRA) 100 MG/ML solution GIVE 15 MLS BY PER G TUBE ROUTE 2 (TWO) TIMES DAILY.    miconazole (MICATIN) 2 % powder 2 times daily as needed     insulin pen needle (FIFTY50 PEN NEEDLES) 31G X 8 MM 1 each     Medications reviewed at today's visit       Objective     OBJECTIVE   PHYSICAL EXAM  This visit was performed during a pandemic event, and the physical exam was limited to my Video observation of this patient's organ systems and/or body areas.   GEN:  Awake and alert  No respiratory distress      Physical Exam  Constitutional:       Appearance: Normal appearance.   Eyes:      General: No scleral icterus.  Pulmonary:      Effort: Pulmonary effort is normal. No respiratory distress.   Abdominal:      General: There is no distension.   Neurological:      Mental Status: He  is alert.              ASSESSMENT   Today we discussed the assessment and management of the following diagnosis:    ICD-10-CM ICD-9-CM   1. Ventilator dependence  Z99.11 V46.11   2. TBI (traumatic brain injury)  S06.9X9A 854.00   3. Seizure disorder  G40.909 345.90   4. Type 2 diabetes mellitus with hyperglycemia, with long-term current use of insulin  E11.65 250.00    Z79.4 790.29     V58.67       PLAN      1. Hypoxia       H/O pneumonia due to Pseudomonas  Increase airway clearance to 3x daily   Hold on antibiotics for now  Follow up sputum culture from 10/22/19    2. Ventilator dependence  Underlying condition, all treatment  decisions made with this in consideration       3. Asthma  Underlying condition, all treatment decisions made with this in consideration      4. T2DM  Increase Levemir to 80u nightly (from 75u)      Next appointment in person in May for weight and exam    Orders Placed This Encounter    insulin detemir (LEVEMIR FLEXPEN) 100 UNIT/ML injection pen       Patient Instructions   Call Pulmonary appt for followup appt with vent clinic [phone]    Use normal saline neb while vesting  Increase vesting to TID and go back to BID on Monday 4/19  Do not suction past the tip of the trach tube    Increase levemir to 80 units           Return in about 4 weeks (around 11/20/2019) for Dr Shanda Howells.      Doretha Imus, MD

## 2019-10-23 NOTE — Telephone Encounter (Signed)
Pharmacy requesting 90 day supply

## 2019-10-23 NOTE — Patient Instructions (Addendum)
Call Pulmonary appt for followup appt with vent clinic [phone]    Use normal saline neb while vesting  Increase vesting to TID and go back to BID on Monday 4/19  Do not suction past the tip of the trach tube    Increase levemir to 80 units

## 2019-10-25 LAB — AEROBIC CULTURE

## 2019-10-29 ENCOUNTER — Telehealth: Payer: Self-pay | Admitting: Pediatrics

## 2019-10-29 NOTE — Telephone Encounter (Signed)
[facility] TELEPHONE INTAKE    Reason for call: [name] from [employer] reported patient's father had a birthday party, and patient's BG today was over 300. Caller stated [name] is currently with the patient and was able to get his BG down into the 200s, but patient has experienced 5 small seizures today where he is staring and unresponsive. [name] stated that [name] is nervous because this is not like the patient.   Per [name], patient's BP is ok, he does not have a fever, and his sats are ok.     [name] also reported patient did not follow up with neurology because the father became very upset during the initial visit. She requested to switch providers with neurology. Phone number to the [facility] neurology department provided.      Name of caller: [name]  Relationship to patient: Clinical Director  Organization (if applicable): [employer]  Phone:  [phone], or [phone]

## 2019-10-30 ENCOUNTER — Telehealth: Payer: Self-pay | Admitting: Pulmonary Disease

## 2019-10-30 LAB — LEGIONELLA CULTURE: Legionella Culture: 0

## 2019-10-30 NOTE — Telephone Encounter (Addendum)
Reason for call: cluster seizures    Grier Rocher, patient's home care nurse.    Yesterday morning [name]'s BG was 310 and he complained of headache, but had had cake/ice cream/chinese take out the night before; was 189 by lunch.  Cluster seizures --twitching, nystagmus, became unresponsive/staring off for about a minute, no postictal period--"about every ten minutes" for a while.  Had 2-3 (brief) grand mal seizures yesterday by 1330 am also    Finally resolved after klonopin. [name] reports patient's father refused klonopin until 4pm. Reports very rarely needs Klonopin, cluster seizures rare.    Has known staph in sputum, family declined antibiotics; patient had increased vest treatments last week, back to twice/day this week.    Had seizure during phone call; lasted 25sec, postictal .    BG this morning 131  VS this am: BP-113/74, P-92, T-98.6, R-16,  O2 sat 89-90% on vent      Plan:  Will call back with provider instructions  Reinforced that klonopin was appropriate and listed for cluster seizures so this is not entirely new  Instructed to call if any seizures today/track any more episodes

## 2019-10-30 NOTE — Telephone Encounter (Signed)
Called [name] patient's father and explained that since this type of seizure event is so rare for [place]/not typical presentation that Dr. [name] believes it warrants reassessment by neuro.  [name] states he is willing to follow up with neuro, but would need to be with a different provider. Did not discuss reasons for dissatisfaction.  Offered to request neuro call patient to set up follow up visit and he would prefer this to calling himself. States he "isn't really worried" and feels the elevated BG is a reasonable explanation of patient's symptoms.

## 2019-10-30 NOTE — Telephone Encounter (Signed)
Copied from CRM [id number]. Topic: Appointments - Schedule Appointment  >> Oct 30, 2019 12:55 PM Dorcas Mcmurray wrote:  Tonya patients primary care nurse called  to schedule a FUV. Writer scheduled for 6/3 with no testing. [name] stated that the patient has staff and wants to make the office aware in case it has any bearing on the patients appointment or testing that may be ordered. Testing may need to be ordered due to the fact the patient has not been seen since 02/17/19  [name] can be reached at [phone]

## 2019-10-31 NOTE — Telephone Encounter (Signed)
Angel Wells has been rescheduled in the vent clinic on 5/10.  Confirmed with Tonya.

## 2019-11-04 ENCOUNTER — Encounter: Payer: Self-pay | Admitting: Gastroenterology

## 2019-11-05 ENCOUNTER — Telehealth: Payer: Self-pay | Admitting: Pediatrics

## 2019-11-05 NOTE — Telephone Encounter (Signed)
[facility] TELEPHONE TRIAGE     Reason for call: Tonya. RN states patient will be starting back up the tobramycin on 5/12 and would like to know if a script is needed for that?  [name] would like a call back to confirm    Name of caller:[name] for [name]  Relationship to patient: RN  Phone:  [phone]

## 2019-11-05 NOTE — Telephone Encounter (Signed)
Incoming paperwork for completion to the [facility]  Date Received: 11/05/2019    Type of paperwork (sender:form type): [facility] review update re: clonazepam use    Completed paperwork to be sent to:  Fax to [phone]    Special Requests:     Placed in box for: Jigar Zielke     Paperwork completed 11/05/2019  Sent as outlined in prior documentation

## 2019-11-06 ENCOUNTER — Other Ambulatory Visit: Payer: Self-pay | Admitting: Pediatrics

## 2019-11-06 DIAGNOSIS — E1165 Type 2 diabetes mellitus with hyperglycemia: Secondary | ICD-10-CM

## 2019-11-06 DIAGNOSIS — J4540 Moderate persistent asthma, uncomplicated: Secondary | ICD-10-CM

## 2019-11-06 DIAGNOSIS — L309 Dermatitis, unspecified: Secondary | ICD-10-CM

## 2019-11-06 DIAGNOSIS — M2450 Contracture, unspecified joint: Secondary | ICD-10-CM

## 2019-11-06 MED ORDER — TRIAMCINOLONE ACETONIDE 0.025 % EX CREA *I*
TOPICAL_CREAM | Freq: Two times a day (BID) | CUTANEOUS | 2 refills | Status: DC
Start: 2019-11-06 — End: 2020-03-26

## 2019-11-06 NOTE — Telephone Encounter (Signed)
Spoke with [name]  Aware of refills  Will contact pharmacy and call if there are any issues getting refills

## 2019-11-07 ENCOUNTER — Other Ambulatory Visit: Payer: Self-pay | Admitting: Pediatrics

## 2019-11-07 ENCOUNTER — Telehealth: Payer: Self-pay | Admitting: Pediatrics

## 2019-11-07 ENCOUNTER — Other Ambulatory Visit: Payer: Self-pay | Admitting: Gastroenterology

## 2019-11-07 DIAGNOSIS — M79662 Pain in left lower leg: Secondary | ICD-10-CM

## 2019-11-07 DIAGNOSIS — J45909 Unspecified asthma, uncomplicated: Secondary | ICD-10-CM

## 2019-11-07 NOTE — Telephone Encounter (Signed)
Angel Wells requested to speak with a [facility] nurse. She is concerned for DVT.   Phone: [phone]

## 2019-11-07 NOTE — Telephone Encounter (Signed)
Please call Angel Wells okay with doing an ultrasound with [facility] but please ask if there has been any trauma (he is a hoyer lift to chair) Please fax order to [facility] and ask them to upload images/report to Epic.

## 2019-11-07 NOTE — Telephone Encounter (Signed)
Facesheet and order faxed to provided number   Confirmation received

## 2019-11-07 NOTE — Telephone Encounter (Signed)
[facility] TELEPHONE TRIAGE     Reason for call: Patient's PDN Burnett Harry reports that patient has a positive homan sign on their lower left extremity. Shelly would like a call back regarding this     Name of caller:Shelly for Gerhard Rappaport  Relationship to patient: PDN  Phone:  [phone]

## 2019-11-07 NOTE — Telephone Encounter (Signed)
Yesterday complained of pain in L leg  Today nurse noted L foot swelling, redness in ankle, positive homan's sign/pain in calf with dorsiflexion, pain radiating to ankle and foot. No redness/heat/lump in calf.  No bruising, no s/s infection. No known injury/fall/trauma lately.  Not on anticoagulants, no hx of clots per nurse.  No pain in thigh/groin. Normal UOP.  Also of note, increased lethargy noted today compared to last seen on Wednesday and baseline; and BG 209.    VS today  BP-131/82; T-97.6, HR-91, O2-96% on RA (vent at night only)

## 2019-11-07 NOTE — Telephone Encounter (Addendum)
Called [facility] and gave verbal for order. Need face sheet and order faxed to [phone].  Gave phone number and fax number for here, and requested images be uploaded to [employer]hare.    Called [facility] back and let them know.  Explained to [facility] based on [facility] assessment concern was for DVT which is why [place] was ordered. [name] states that even without known injury patient is transferred and goes out fairly often so "it's possible he could sustain a stress fracture that goes unnoticed"    Burnett Harry also called again asking what the plan was. Author discussed Shelly's assessment again to clarify what she meant by "homan's sign" and Shelly reports that she squeezed patient's calf. Shelly asking if we will order xray as well. Called Lie and explained different report from Center now. Per Lie, no need for xray.

## 2019-11-07 NOTE — Telephone Encounter (Signed)
[name] from [facility] called inquiring if patient should get an X-ray for his calf. [name] would like a call back regarding this.   [phone]

## 2019-11-08 ENCOUNTER — Telehealth: Payer: Self-pay | Admitting: Primary Care

## 2019-11-08 MED ORDER — IBUPROFEN 600 MG PO TABS *I*
600.0000 mg | ORAL_TABLET | Freq: Four times a day (QID) | ORAL | 0 refills | Status: DC | PRN
Start: 2019-11-08 — End: 2020-05-13

## 2019-11-08 MED ORDER — ALBUTEROL SULFATE (2.5 MG/3ML) 0.083% IN NEBU *I*
2.5000 mg | INHALATION_SOLUTION | Freq: Two times a day (BID) | RESPIRATORY_TRACT | 3 refills | Status: DC
Start: 2019-11-08 — End: 2020-05-13

## 2019-11-08 NOTE — Telephone Encounter (Signed)
[facility]  After Hours Phone Note      History   Nurse for Angel Wells [birth year], Shelly called reporting calf pain.    Pain began approximately 3 days ago.  Nurse called regarding patient earlier in the week.  Lower extremity Doppler ultrasound was ordered to rule out DVT and was negative.     Over the past 24 hours, swelling in left lower extremity, particularly ankle has improved.  Nurse still notes pain with plantar and dorsiflexion of left ankle.  There is no point tenderness.  No erythema.     [name] does not have any systemic symptoms such as fever, chills.  Besides left foot/ankle pain he is otherwise well-appearing.    Nurse called because she will be off on Sunday and Monday and wanted to "have this wrapped up before the new nurse comes in on Monday."       Plan      Using Doximity video, I was able to do a visual examination of patient's left lower extremity.  I did not appreciate any erythema or edema.  He did appear to have pain with dorsiflexion of left ankle but there not appear to be any point tenderness when watching the nurse palpate areas on his foot.     Reassurance provided to patient's father and nurse that apparent injury appears to be improving.    Continue ibuprofen and acetaminophen as needed    Patient's father and nurse expressed understanding of plan and will call for further concerns    [name], MD  11/08/2019

## 2019-11-14 ENCOUNTER — Telehealth: Payer: Self-pay

## 2019-11-14 NOTE — Telephone Encounter (Signed)
Incoming paperwork for completion to the [facility]  Date Received: 11/14/2019    Type of paperwork (sender:form type): [facility] for Discharge Summary     Completed paperwork to be sent to:  Fax to [phone]    Special Requests:     Placed in box for: [name]

## 2019-11-17 ENCOUNTER — Ambulatory Visit: Payer: Medicare Other

## 2019-11-17 ENCOUNTER — Encounter: Payer: Self-pay | Admitting: Gastroenterology

## 2019-11-19 NOTE — Telephone Encounter (Signed)
Paperwork completed 11/19/2019  Sent as outlined in prior documentation

## 2019-11-20 ENCOUNTER — Ambulatory Visit: Payer: Medicare Other | Attending: Neurology | Admitting: Neurology

## 2019-11-20 ENCOUNTER — Encounter: Payer: Self-pay | Admitting: Neurology

## 2019-11-20 DIAGNOSIS — G40219 Localization-related (focal) (partial) symptomatic epilepsy and epileptic syndromes with complex partial seizures, intractable, without status epilepticus: Secondary | ICD-10-CM | POA: Insufficient documentation

## 2019-11-20 DIAGNOSIS — S069X9A Unspecified intracranial injury with loss of consciousness of unspecified duration, initial encounter: Secondary | ICD-10-CM

## 2019-11-20 DIAGNOSIS — S069XAA Unspecified intracranial injury with loss of consciousness status unknown, initial encounter: Secondary | ICD-10-CM

## 2019-11-20 MED ORDER — LAMOTRIGINE 25 MG PO TABS *I*
ORAL_TABLET | ORAL | 0 refills | Status: DC
Start: 2019-11-20 — End: 2019-12-16

## 2019-11-20 NOTE — Progress Notes (Signed)
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  Angel Wells, one of his home care nurses, accompanied him today. Angel Wells and his father had a new patient evaluation conducted by telemedicine on 04/16/2019.  A telemedicine follow-up appointment on 08/30/2019 was missed, and his father subsequently requested a transfer of care to a different neurologist.  I reviewed the note from his 04/16/2019 appointment.  I transcribed the history of present illness below during my discussion with Angel Wells and Ms. Wells, though it is not written verbatim.    HISTORY OF PRESENT ILLNESS:  Ms. Angel Wells tells me that his seizures are increasing.  The nursing staff are more concerned about his seizures than his father seems to be.  They are not daily.  He might have two or three in a day, and then have a few days without them.  They last 5 to 30 seconds.  When he has a seizure his arms are extended and his head turns to the left.  His eyes don't roll back totally.  They tend to occur between 3 AM and 8 AM when he is asleep. The seizure wakes him up, and he is able to go back to sleep.  They can occur when he is constipated.  They are concerned that playing video games for a lot of hours over a string of days makes him more likely to have a seizure.  When he has a seizure at night he seems to be irritated in some way such as he needs suctioning, he is too warm, or he is damp.  He had 22 seizures last month.  On the day he had cluster seizures, some where characterized by tonic deviation of the eyes to the left and staring.      EXAMINATION:  No formal examination was performed today.  Angel Wells was on a transport stretcher and appeared comfortable.  He was awake, alert, and in good spirits.  He mumbled to himself throughout the visit, but he answered direct  questions.  He told me he prefers being called Angel Wells to being called Angel Wells.  He likes meat and cheese at [employer].  He likes super heroes and video games.       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  The semiology of his seizures suggests that secondary generalization is common.  He is having very frequent seizures while taking a dose of valproate that on last check was maintaining his level at the high end of the therapeutic range and the maximum recommended daily dose of levetiracetam.  I explained that serum levetiracetam levels are generally not helpful for making dosing decisions.  Doses higher than 3000 mg/day are unlikely to be more helpful regardless of the serum level.      I explained to Ms. Wells that my goal for managing seizure disorders is for the person to have no seizures and no adverse medication effects.  I am not sure that we will be able to get Angel Wells seizure  disorder under complete control, but I think reducing his seizure frequency will be beneficial to him and to his caregivers.  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  I recommend adding lamotrigine to his medication regimen. Ms. Angel Wells was not sure if Angel Wells father would be receptive to making a medication change.  I will send a note to him through My Chart explaining my recommendations and the rationale for adding a third agent.  Lamotrigine is advantageous because of the slow titration schedule  and it seems to have good synergy with valproate.  I considered using carbamazepine or oxcarbazepine, but both of those may cause hyponatremia and his recent sodium levels have been low or at the low end of the normal range.  Zonisamide would be another option.  The capsules can be opened and the granules administered through his G-tube.      I spent 20 minutes face-to-face with Angel Wells and Ms. Wells, and I spent an additional 25 minutes on the same calendar day reviewing his records, completing this documentation, and composing a message to his father.      PLAN:   He will continue levetiracetam 100 mg/mL 15 mL per tube twice daily and valproate 50 mg/ml 5 mL per tube three times daily.   His nursing staff will continue to administer clonazepam 1 mg one tablet crushed per tube for management of clusters of seizures.    I will communicate with his father by My Chart or phone regarding initiation of lamotrigine.    I will see Angel Wells again in six months.  I would be happy to see him sooner if needed.      Francee Piccolo, MD  Assoc. Professor of Clinical Neurology  Pager: [phone]  Office phone: [phone]  Office fax: [phone]  11/20/2019 1:44 PM

## 2019-11-21 ENCOUNTER — Telehealth: Payer: Self-pay | Admitting: Neurology

## 2019-11-21 NOTE — Telephone Encounter (Signed)
Spoke with pharmacy. Interaction is a potential for a severe rash. I let pharmacist know that provider is aware that patient is on both medications and that this will be monitored.

## 2019-11-21 NOTE — Telephone Encounter (Signed)
[facility] called and said there is a drug interaction lamoTRIgine (LAMICTAL) 25 MG tablet and valproate (VALPROIC ACID) 50 mg/mL syrup    Please advise pharmacy. [phone]

## 2019-11-21 NOTE — Telephone Encounter (Signed)
Lamotrigine order faxed to [facility] at [phone].

## 2019-11-22 ENCOUNTER — Encounter: Payer: Self-pay | Admitting: Gastroenterology

## 2019-12-02 ENCOUNTER — Ambulatory Visit: Payer: Medicare Other | Attending: Pediatrics | Admitting: Pediatrics

## 2019-12-02 ENCOUNTER — Telehealth: Payer: Self-pay | Admitting: Pediatrics

## 2019-12-02 ENCOUNTER — Encounter: Payer: Self-pay | Admitting: Pediatrics

## 2019-12-02 VITALS — BP 107/68 | HR 89 | Temp 97.8°F | Wt 171.8 lb

## 2019-12-02 DIAGNOSIS — S069XAA Unspecified intracranial injury with loss of consciousness status unknown, initial encounter: Secondary | ICD-10-CM

## 2019-12-02 DIAGNOSIS — Z794 Long term (current) use of insulin: Secondary | ICD-10-CM | POA: Insufficient documentation

## 2019-12-02 DIAGNOSIS — E1165 Type 2 diabetes mellitus with hyperglycemia: Secondary | ICD-10-CM | POA: Insufficient documentation

## 2019-12-02 DIAGNOSIS — S069X9A Unspecified intracranial injury with loss of consciousness of unspecified duration, initial encounter: Secondary | ICD-10-CM | POA: Insufficient documentation

## 2019-12-02 DIAGNOSIS — G800 Spastic quadriplegic cerebral palsy: Secondary | ICD-10-CM | POA: Insufficient documentation

## 2019-12-02 DIAGNOSIS — J4540 Moderate persistent asthma, uncomplicated: Secondary | ICD-10-CM | POA: Insufficient documentation

## 2019-12-02 LAB — LIPID PANEL
Chol/HDL Ratio: 4.6
Cholesterol: 148 mg/dL
HDL: 32 mg/dL — ABNORMAL LOW (ref 40–60)
LDL Calculated: 75 mg/dL
Non HDL Cholesterol: 116 mg/dL
Triglycerides: 207 mg/dL — AB

## 2019-12-02 LAB — CBC AND DIFFERENTIAL
Baso # K/uL: 0 10*3/uL (ref 0.0–0.1)
Basophil %: 0.3 %
Eos # K/uL: 0.1 10*3/uL (ref 0.0–0.5)
Eosinophil %: 0.7 %
Hematocrit: 39 % — ABNORMAL LOW (ref 40–51)
Hemoglobin: 12.5 g/dL — ABNORMAL LOW (ref 13.7–17.5)
IMM Granulocytes #: 0 10*3/uL (ref 0.0–0.0)
IMM Granulocytes: 0.5 %
Lymph # K/uL: 1.5 10*3/uL (ref 1.3–3.6)
Lymphocyte %: 17.3 %
MCH: 29 pg (ref 26–32)
MCHC: 32 g/dL (ref 32–37)
MCV: 91 fL (ref 79–92)
Mono # K/uL: 0.4 10*3/uL (ref 0.3–0.8)
Monocyte %: 5 %
Neut # K/uL: 6.8 10*3/uL — ABNORMAL HIGH (ref 1.8–5.4)
Nucl RBC # K/uL: 0 10*3/uL (ref 0.0–0.0)
Nucl RBC %: 0 /100 WBC (ref 0.0–0.2)
Platelets: 235 10*3/uL (ref 150–330)
RBC: 4.3 MIL/uL — ABNORMAL LOW (ref 4.6–6.1)
RDW: 13.4 % (ref 11.6–14.4)
Seg Neut %: 76.2 %
WBC: 8.9 10*3/uL (ref 4.2–9.1)

## 2019-12-02 LAB — BASIC METABOLIC PANEL
Anion Gap: 18 — ABNORMAL HIGH (ref 7–16)
CO2: 23 mmol/L (ref 20–28)
Calcium: 9.7 mg/dL (ref 9.0–10.3)
Chloride: 90 mmol/L — ABNORMAL LOW (ref 96–108)
Creatinine: 0.33 mg/dL — ABNORMAL LOW (ref 0.67–1.17)
GFR,Black: 203 *
GFR,Caucasian: 175 *
Glucose: 173 mg/dL — ABNORMAL HIGH (ref 60–99)
Lab: 6 mg/dL (ref 6–20)
Potassium: 3.9 mmol/L (ref 3.3–5.1)
Sodium: 131 mmol/L — ABNORMAL LOW (ref 133–145)

## 2019-12-02 LAB — TSH: TSH: 1.93 u[IU]/mL (ref 0.27–4.20)

## 2019-12-02 MED ORDER — SENNOSIDES 8.8 MG/5ML PO SYRP *I*
10.0000 mL | ORAL_SOLUTION | Freq: Every evening | ORAL | 5 refills | Status: AC
Start: 2019-12-02 — End: 2020-05-30

## 2019-12-02 MED ORDER — IPRATROPIUM BROMIDE 0.02 % IN SOLN *I*
500.0000 ug | Freq: Two times a day (BID) | RESPIRATORY_TRACT | 5 refills | Status: DC
Start: 2019-12-02 — End: 2020-04-15

## 2019-12-02 MED ORDER — DOCUSATE SODIUM 10 MG/ML PO LIQD *I*
100.0000 mg | Freq: Two times a day (BID) | ORAL | 5 refills | Status: DC
Start: 2019-12-02 — End: 2020-05-13

## 2019-12-02 NOTE — Progress Notes (Signed)
[facility]  [address]  [place] [address]  Phone: [phone]  Fax: [phone]     REASON FOR VISIT      Chief Complaint   Patient presents with    Diabetes    Seizures         PRIMARY DIAGNOSIS      TBI, trach dependent, spastic quadriplegia    Subjective     SUBJECTIVE      Nassim was accompanied by home nurse Shelly who provided all of subjective history     DM2 - sugars have been better, highest BG 252 - lowest BG 98  140-150 will break out in a cold sweat.  Currently receiving meal time baseline as well as long acting insulin     Trach dependent at night - when he gets taken off of the vent   He is getting out of bed more, he is on a more normal schedule  Midnight - 8:30am on the vent  11am - RN comes in - he has had 3 neb treatments and vest and is in the mid 90s. Will dip to mid 80s but he hears the alarm and will take a deep breath. Having more secretions thick/white scant in the morning, feels like this is more allergy related, has a good cough, vest BID      She would like him to get OT eval for upper extremity function and back into PT (had not been able to transport except in a stretcher previously) now has [employer] lift at home so can get into his wheelchair     ROS  Review of Systems as per HPI above    Past Medical History, Social History, Family History, and Medications/allergies reviewed during this visit    Current Outpatient Medications   Medication    ipratropium (ATROVENT) 0.02 % nebulizer solution    albuterol (PROVENTIL) (2.5 mg/27m) 0.083% nebulizer solution    baclofen (LIORESAL) 10 MG tablet    triamcinolone (KENALOG) 0.025 % cream    valproate (VALPROIC ACID) 50 mg/mL syrup    insulin detemir (LEVEMIR FLEXPEN) 100 UNIT/ML injection pen    MIC-KEY gastrostomy feeding tube extension set 24"    Syringe Disposable 60 ML MISC    MIC-KEY gastrostomy kit 14FR    gauze pads 2"X2"    generic DME    insulin aspart (NOVOLOG FLEXPEN) 100 UNIT/ML injection pen     budesonide (PULMICORT) 0.25 MG/2ML nebulizer solution    Nebulizers (PARI LC PLUS VIOS PRO NEB) MISC    fluticasone (FLONASE) 50 MCG/ACT nasal spray    tobramycin, PF, (TOBI) 300 MG/5ML nebulizer solution    generic DME    generic DME    nebulizer device with mask and tubing    famotidine (PEPCID) 20 MG tablet    generic DME    generic DME    generic DME    lactobacillus rhamnosus, GG, (CULTURELLE) capsule    Gauze Pads & Dressings (SPLIT GAUZE DRAINAGE SPONGE) 4"X4"    generic DME    Nebulizer MISC    generic DME    generic DME    generic DME    sodium chloride 0.9 % nebulizer solution    gauze sponge (CURITY) 4"X4" pads    generic DME    generic DME    generic DME    Distilled Water LIQD    generic DME    generic DME    generic DME    generic DME  generic DME    generic DME    generic DME    generic DME    FIBER SELECT GUMMIES PO    Multiple Vitamins-Minerals (HM MULTIVITAMIN ADULT GUMMY PO)    Ascorbic Acid (VITAMIN C ADULT GUMMIES PO)    Cholecalciferol (VITAMIN D3) 50 MCG (2000 UT) capsule    cetirizine (ZYRTEC) 1 MG/ML syrup    oxygen    Blood Glucose Monitoring Suppl (FIFTY50 GLUCOSE METER 2.0) w/Device KIT    EPINEPHrine 0.15 mg/0.3 mL auto-injector    lactulose (LACTULOSE) 20 GM/30ML solution    levETIRAcetam (KEPPRA) 100 MG/ML solution    insulin pen needle (FIFTY50 PEN NEEDLES) 31G X 8 MM    ciprofloxacin (CIPRO) 500 MG tablet    lamoTRIgine (LAMICTAL) 25 MG tablet    senna (SENOKOT) 8.6 MG tablet    generic DME    docusate sodium (COLACE) 10 mg/mL liquid    sennosides (SENOKOT) 8.8 mg/80m syrup    ibuprofen (ADVIL,MOTRIN) 600 MG tablet    B-D UF III MINI PEN NEEDLES 31G X 5 MM    Accu-Chek FastClix Lancets MISC    blood glucose (ACCU-CHEK AVIVA PLUS) test strip    bisacodyl (DULCOLAX) 10 MG suppository    clonazePAM (KLONOPIN) 1 MG tablet    GUAIFENESIN PO    zinc oxide (DESITIN) 40 % paste    Adhesive Tape 1"x5yd TAPE    surgical lubricant  (SURGILUBE) gel    diphenhydrAMINE (BENADRYL) 25 MG oral solid    miconazole (MICATIN) 2 % powder     No current facility-administered medications for this visit.          Objective     OBJECTIVE      BP 107/68 (BP Location: Right arm, Patient Position: Sitting, Cuff Size: adult)    Pulse 89    Temp 36.6 C (97.8 F) (Temporal)    Wt 77.9 kg (171 lb 12.8 oz)    SpO2 95%    BMI 28.59 kg/m     Physical Exam  Vitals reviewed.   Constitutional:       Comments: Patient laying in EMS stretcher   Cardiovascular:      Rate and Rhythm: Normal rate and regular rhythm.      Pulses: Normal pulses.      Heart sounds: No murmur heard.     Pulmonary:      Effort: Pulmonary effort is normal.      Comments: Transmitted upper airway sounds    Abdominal:      General: Abdomen is flat.      Palpations: Abdomen is soft.   Musculoskeletal:      Comments: Thoracic scoliosis, cannot move lower extremities, limited movement of upper extremities RUE >LUE, has flexion contractures at the wrist, no witnessed spasms    Skin:     Findings: No rash.      Comments: Excoriation and pustular erythematous rash over buttocks and rectal area, no sacral ulcer seen   Neurological:      Mental Status: He is alert and oriented to person, place, and time.      Comments: Hyperreflexic in patellar/achilles reflex bilateral   Psychiatric:         Mood and Affect: Mood normal.              ASSESSMENT / DIAGNOSIS           1. TBI (traumatic brain injury)  underlying condition, all treatment decisions made with this in consideration. Will place referral  to OT.  Unclear why he requires ALS support for transport, I'm okay with BLS transport and he can also be transported in his wheelchair  - [place] / OCCUPATIONAL THERAPY  - Hemoglobin A1c; Future  - Basic metabolic panel; Future  - Hepatitis C antibody; Future  - CBC and differential; Future  - Lipid Panel (Reflex to Direct  LDL if Triglycerides more than  400); Future  - TSH; Future  - TSH  - Lipid Panel (Reflex to Direct  LDL if Triglycerides more than 400)  - CBC and differential  - Hepatitis C antibody  - Basic metabolic panel  - Hemoglobin A1c    2. Spastic quadriplegic cerebral palsy  Increase bowel regimen with senna and will add docusate sodium  - AMB REFERRAL TO [facility]  - AMB REFERRAL TO PHYSICAL / OCCUPATIONAL THERAPY  - Hemoglobin A1c; Future  - Basic metabolic panel; Future  - Hepatitis C antibody; Future  - CBC and differential; Future  - Lipid Panel (Reflex to Direct  LDL if Triglycerides more than 400); Future  - TSH; Future  - TSH  - Lipid Panel (Reflex to Direct  LDL if Triglycerides more than 400)  - CBC and differential  - Hepatitis C antibody  - Basic metabolic panel  - Hemoglobin A1c    3. Moderate persistent asthma without complication  Continue same meds for asthma, likely having some allergic type symptoms currently  - ipratropium (ATROVENT) 0.02 % nebulizer solution; Inhale 2.5 mLs (500 mcg total) by nebulization 2 times daily Dx: J45.4  Dispense: 150 mL; Refill: 5  - Hemoglobin A1c; Future  - Basic metabolic panel; Future  - Hepatitis C antibody; Future  - CBC and differential; Future  - Lipid Panel (Reflex to Direct  LDL if Triglycerides more than 400); Future  - TSH; Future  - TSH  - Lipid Panel (Reflex to Direct  LDL if Triglycerides more than 400)  - CBC and differential  - Hepatitis C antibody  - Basic metabolic panel  - Hemoglobin A1c    4. Type 2 diabetes mellitus with hyperglycemia, with long-term current use of insulin  Sugars are somewhat improved with meal time baseline, he eats by mouth for comfort, family is working at carb consistent diet, due for labs  - Hemoglobin A1c; Future  - Basic metabolic panel; Future  - Lipid Panel (Reflex to Direct  LDL if Triglycerides more than 400); Future  - Lipid Panel (Reflex to Direct  LDL if Triglycerides more than 400)  - Basic metabolic panel  - Hemoglobin A1c      Orders Placed  This Encounter    Hemoglobin S9G    Basic metabolic panel    Hepatitis C antibody    CBC and differential    Lipid Panel (Reflex to Direct  LDL if Triglycerides more than 400)    TSH    AMB REFERRAL TO [facility]    AMB REFERRAL TO PHYSICAL / OCCUPATIONAL THERAPY    docusate sodium (COLACE) 10 mg/mL liquid    sennosides (SENOKOT) 8.8 mg/30m syrup    ipratropium (ATROVENT) 0.02 % nebulizer solution     Patient Instructions   He no longer needs to be transported by stretcher and does not need ALS support for transport.    Increase senna to 135mnightly and will add docusate sodium    We placed referral for OT ([facility])             --  Patient instructed to call if symptoms are not improving or worsening  --Follow-up arranged  Return in about 3 months (around 03/03/2020) for Dr Shanda Howells.     Electronically signed by Doretha Imus, MD , 12/22/2019 @   [facility], Phone: [phone]

## 2019-12-02 NOTE — Progress Notes (Signed)
Review of RT therapies and vent settings/supplies. No issues currently.

## 2019-12-02 NOTE — Patient Instructions (Addendum)
He no longer needs to be transported by stretcher and does not need ALS support for transport.    Increase senna to 54ml nightly and will add docusate sodium    We placed referral for OT ([facility])

## 2019-12-02 NOTE — Telephone Encounter (Signed)
Schedule patient NPV with PT and 3 month video follow up PCP

## 2019-12-03 ENCOUNTER — Telehealth: Payer: Self-pay | Admitting: Pediatrics

## 2019-12-03 LAB — HEPATITIS C ANTIBODY: Hep C Ab: NEGATIVE

## 2019-12-03 LAB — HEMOGLOBIN A1C: Hemoglobin A1C: 6.8 % — ABNORMAL HIGH

## 2019-12-03 NOTE — Telephone Encounter (Signed)
[facility] TELEPHONE TRIAGE     Reason for call: Tanya requested to speak to the dietician. Tanya requests to relay the patients mickey button has been approved. [name] did not wish to provide further information to Clinical research associate. [name] provided contact information to receive a return call.     Name of caller: [name] for [name]  Relationship to patient: PDN  Phone: [phone]

## 2019-12-04 NOTE — Telephone Encounter (Signed)
Appointments scheduled

## 2019-12-05 ENCOUNTER — Telehealth: Payer: Self-pay

## 2019-12-05 NOTE — Telephone Encounter (Signed)
[name] (RN) called regarding new prescription for colace from Dr. [name] was denied by insurance. She was wondering if it could be sent over in a different form other than liquid that would be approved.

## 2019-12-05 NOTE — Telephone Encounter (Signed)
Incoming paperwork for completion to the [facility]  Date Received: 12/05/2019    Type of paperwork (sender:form type): Confirmation of Order    Completed paperwork to be sent to:  Fax to [phone]    Special Requests:     Placed in box for: [name]

## 2019-12-10 NOTE — Telephone Encounter (Signed)
Returned call--this issue has been sorted: when medicare denied, medicaid covered.

## 2019-12-11 ENCOUNTER — Telehealth: Payer: Self-pay | Admitting: Neurology

## 2019-12-11 ENCOUNTER — Encounter: Payer: Self-pay | Admitting: Gastroenterology

## 2019-12-11 ENCOUNTER — Telehealth: Payer: Self-pay | Admitting: Pediatrics

## 2019-12-11 ENCOUNTER — Ambulatory Visit: Payer: Medicare Other | Admitting: Pulmonology

## 2019-12-11 ENCOUNTER — Telehealth: Payer: Self-pay

## 2019-12-11 DIAGNOSIS — Z794 Long term (current) use of insulin: Secondary | ICD-10-CM

## 2019-12-11 DIAGNOSIS — J4540 Moderate persistent asthma, uncomplicated: Secondary | ICD-10-CM

## 2019-12-11 DIAGNOSIS — E1165 Type 2 diabetes mellitus with hyperglycemia: Secondary | ICD-10-CM

## 2019-12-11 NOTE — Telephone Encounter (Signed)
Please let Angel Wells and family know that his diabetes number is still at goal but increasing so will need to be sure we are following carb consistent diet.    His sodium was a little on the low side so would like him to get repeat labs done in a week to be sure this is not getting any lower.  His cholesterol number is stable.  Please ask if they need a home draw otherwise they can take him to any [facility] lab

## 2019-12-11 NOTE — Telephone Encounter (Signed)
[facility] TELEPHONE TRIAGE      Reason for call: Caller concerned about order from Dr. [name] on 12/02/2019 stating "He no longer needs to be transported by stretcher and does not need ALS support for transport." Concern is his wheelchair and safety in transport now that OT/PT is approved.     Name of caller: Tonya    Relationship to patient: day nurse    Phone:   [phone]

## 2019-12-11 NOTE — Telephone Encounter (Signed)
Received order from [facility] to be signed. Placed in Lindsay's mailbox.

## 2019-12-12 ENCOUNTER — Encounter: Payer: Self-pay | Admitting: Pediatrics

## 2019-12-12 ENCOUNTER — Telehealth: Payer: Self-pay | Admitting: Pediatrics

## 2019-12-12 ENCOUNTER — Encounter: Payer: Self-pay | Admitting: Gastroenterology

## 2019-12-12 DIAGNOSIS — S069X9A Unspecified intracranial injury with loss of consciousness of unspecified duration, initial encounter: Secondary | ICD-10-CM

## 2019-12-12 DIAGNOSIS — S069XAA Unspecified intracranial injury with loss of consciousness status unknown, initial encounter: Secondary | ICD-10-CM

## 2019-12-12 DIAGNOSIS — G800 Spastic quadriplegic cerebral palsy: Secondary | ICD-10-CM

## 2019-12-12 MED ORDER — GENERIC DME *A*
0 refills | Status: DC
Start: 2019-12-12 — End: 2022-06-23

## 2019-12-12 NOTE — Telephone Encounter (Signed)
Incoming paperwork for completion to the [facility]  Date Received: 12/12/2019    Type of paperwork (sender:form type): [facility]- revision to plan of care    Completed paperwork to be sent to:  Fax to [phone]    Special Requests:     Placed in box for: [name]

## 2019-12-12 NOTE — Telephone Encounter (Signed)
Faxed back to [phone], fax confirmed and sent to scanning

## 2019-12-12 NOTE — Addendum Note (Signed)
Addended by: Midge Minium on: 12/12/2019 12:08 PM     Modules accepted: Orders

## 2019-12-12 NOTE — Telephone Encounter (Addendum)
Spoke to Mize, Charity fundraiser.  Relayed that she disagrees with [name] no longer NOT needing ALS transportation.  Feels that he will get to that point in the soonish future.  Would like him to work more with PT/OT.    [name] has a wheelchair that he's in during the day.  [name] acknowledged that he typically does not desat during transportation or during appt.  Still feels like he needs more time with ALS for safety reasons.  Confirmed that his 2015 was not changed as of yet.  Will route to PCP to check in.

## 2019-12-12 NOTE — Telephone Encounter (Signed)
I reviewed and signed the orders and they can be returned.  Thank you.

## 2019-12-12 NOTE — Telephone Encounter (Signed)
Paperwork completed 12/12/2019  Sent as outlined in prior documentation

## 2019-12-12 NOTE — Telephone Encounter (Signed)
Error

## 2019-12-12 NOTE — Telephone Encounter (Signed)
Called patient and spoke to dad to relay message from Dr. [name] as below; dad reports they are trying to do carb consistent diet but "some of the nurses do their own thing." Encouraged to reach out if they needed any support/advice.    [name] (dad) expressed appreciation and understanding of call. Phone numbers updated in patient's chart.    Called [facility] home draw services to request lab draw next Thursday. Faxed lab req to [phone]

## 2019-12-15 NOTE — Telephone Encounter (Signed)
Paperwork completed 12/15/2019  Sent as outlined in prior documentation

## 2019-12-15 NOTE — Telephone Encounter (Signed)
Logged into [facility] to check into transportation status regarding ALS vs BLS needs.  Noticed this:  Enrollee is Not Currently Eligible. Plan: IC Contact: [facility] Phone: [phone]    Will route to CM for further assistance.

## 2019-12-16 ENCOUNTER — Ambulatory Visit: Payer: Medicare Other | Admitting: Rehabilitative and Restorative Service Providers"

## 2019-12-16 ENCOUNTER — Other Ambulatory Visit: Payer: Self-pay | Admitting: Pediatrics

## 2019-12-16 ENCOUNTER — Other Ambulatory Visit: Payer: Self-pay | Admitting: Neurology

## 2019-12-16 DIAGNOSIS — G40219 Localization-related (focal) (partial) symptomatic epilepsy and epileptic syndromes with complex partial seizures, intractable, without status epilepticus: Secondary | ICD-10-CM

## 2019-12-16 MED ORDER — SENNOSIDES 8.6 MG PO TABS *I*
1.0000 | ORAL_TABLET | Freq: Every day | ORAL | 3 refills | Status: DC
Start: 2019-12-16 — End: 2020-05-13

## 2019-12-16 NOTE — Telephone Encounter (Signed)
[place] is enrolled in [facility]) with [employer] which includes transportation services, therefore he cannot get transportation  from [employer]. He gets transportation from the [facility] in his case is [facility]. I spoke to [place] she change transportation to BLS with [employer].

## 2019-12-16 NOTE — Telephone Encounter (Signed)
Pharmacy states "liquid too expensive, please send tablets". Refill pended for provider review.

## 2019-12-18 ENCOUNTER — Other Ambulatory Visit
Admission: RE | Admit: 2019-12-18 | Discharge: 2019-12-18 | Disposition: A | Discharge status: Home / Self Care | Payer: Medicare Other | Source: Ambulatory Visit | Attending: Pediatrics | Admitting: Pediatrics

## 2019-12-18 DIAGNOSIS — E1165 Type 2 diabetes mellitus with hyperglycemia: Secondary | ICD-10-CM | POA: Insufficient documentation

## 2019-12-18 LAB — BASIC METABOLIC PANEL
Anion Gap: 11 (ref 7–16)
CO2: 27 mmol/L (ref 20–28)
Calcium: 9.7 mg/dL (ref 9.0–10.3)
Chloride: 94 mmol/L — ABNORMAL LOW (ref 96–108)
Creatinine: 0.28 mg/dL — ABNORMAL LOW (ref 0.67–1.17)
GFR,Black: 217 *
GFR,Caucasian: 188 *
Lab: 4 mg/dL — ABNORMAL LOW (ref 6–20)
Potassium: 4.5 mmol/L (ref 3.3–5.1)
Sodium: 132 mmol/L — ABNORMAL LOW (ref 133–145)

## 2019-12-18 LAB — GLUCOSE: Glucose: 196 mg/dL — ABNORMAL HIGH (ref 60–99)

## 2019-12-19 ENCOUNTER — Encounter: Payer: Self-pay | Admitting: Gastroenterology

## 2019-12-21 ENCOUNTER — Telehealth: Payer: Self-pay | Admitting: Pediatrics

## 2019-12-21 ENCOUNTER — Encounter: Payer: Self-pay | Admitting: Gastroenterology

## 2019-12-21 ENCOUNTER — Other Ambulatory Visit
Admission: RE | Admit: 2019-12-21 | Discharge: 2019-12-21 | Disposition: A | Discharge status: Home / Self Care | Payer: Medicare Other | Source: Ambulatory Visit | Attending: Pediatrics | Admitting: Pediatrics

## 2019-12-21 DIAGNOSIS — J189 Pneumonia, unspecified organism: Secondary | ICD-10-CM | POA: Insufficient documentation

## 2019-12-21 DIAGNOSIS — Z8701 Personal history of pneumonia (recurrent): Secondary | ICD-10-CM

## 2019-12-21 DIAGNOSIS — R093 Abnormal sputum: Secondary | ICD-10-CM

## 2019-12-21 MED ORDER — CIPROFLOXACIN HCL 500 MG PO TABS *I*
ORAL_TABLET | ORAL | 0 refills | Status: AC
Start: 2019-12-21 — End: 2020-01-04

## 2019-12-21 NOTE — Telephone Encounter (Addendum)
[facility]  After Hours Phone Note      History   Angel Wells, PDN, the patient's authorized caregiver of Angel Wells Sep 23, 1990 called reporting increased sputum, white/yellow secretions, had to go back on the vent intermittently (normally on just at night) she reports decreased breath sounds on right and diffuse expiratory wheeze     She does not report any known aspiration event   Secretions increased 3 days ago, he just doesn't seem like himself   Has increased respiratory PT and nebs   No documented fevers, but reports some sweats   No known sick contacts   Dad is agreeable to starting antibiotics if needed   He is now back off of the vent      Assessment     28yo trach dependent, 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      Midge Minium, MD  12/21/2019  1:58 PM    CXR read by [facility] with R basilar opacities.  Spoke to [place] will write for antibiotics, cipro BID for 14 days (last cx grew SA but also has had previous PsA) he is allergic to bactrim.  He has follow up tomorrow by video visit.  Midge Minium, MD 9:01 PM

## 2019-12-22 ENCOUNTER — Encounter: Payer: Self-pay | Admitting: Pediatrics

## 2019-12-22 ENCOUNTER — Other Ambulatory Visit: Admission: RE | Admit: 2019-12-22 | Payer: Medicare Other | Source: Ambulatory Visit

## 2019-12-22 ENCOUNTER — Telehealth: Payer: Self-pay | Admitting: Pediatrics

## 2019-12-22 ENCOUNTER — Ambulatory Visit: Payer: Medicare Other | Attending: Internal Medicine | Admitting: Internal Medicine

## 2019-12-22 DIAGNOSIS — J189 Pneumonia, unspecified organism: Secondary | ICD-10-CM | POA: Insufficient documentation

## 2019-12-22 DIAGNOSIS — J9611 Chronic respiratory failure with hypoxia: Secondary | ICD-10-CM | POA: Insufficient documentation

## 2019-12-22 DIAGNOSIS — S069XAA Unspecified intracranial injury with loss of consciousness status unknown, initial encounter: Secondary | ICD-10-CM

## 2019-12-22 DIAGNOSIS — G8250 Quadriplegia, unspecified: Secondary | ICD-10-CM | POA: Insufficient documentation

## 2019-12-22 DIAGNOSIS — S069X9A Unspecified intracranial injury with loss of consciousness of unspecified duration, initial encounter: Secondary | ICD-10-CM | POA: Insufficient documentation

## 2019-12-22 DIAGNOSIS — K5900 Constipation, unspecified: Secondary | ICD-10-CM | POA: Insufficient documentation

## 2019-12-22 DIAGNOSIS — E1165 Type 2 diabetes mellitus with hyperglycemia: Secondary | ICD-10-CM | POA: Insufficient documentation

## 2019-12-22 DIAGNOSIS — Z794 Long term (current) use of insulin: Secondary | ICD-10-CM | POA: Insufficient documentation

## 2019-12-22 LAB — GRAM STAIN

## 2019-12-22 NOTE — Progress Notes (Signed)
[facility] COMPLAINT     Chief Complaint   Patient presents with    Follow-up       Subjective     TELEMEDICINE CONSENT     Visit being conducted by Video in lieu of a face to face visit to minimize health care worker and patient exposure to COVID-19, and conserve PPE.    Location of Patient: home  Location of Telemedicine Provider: hospital / clinical location  Other Participants in telemedicine encounter and roles: Father and nurse- Gerald Stabs    Consent was obtained from the patient to complete this telemedicine visit; including the potential for financial liability.  How did the patient provide consent? Verbal Consent Only    SUBJECTIVE     Increased sputum/cough- called over the weekend and was placed on antibotics for right lower lobe pneumonia.   Today he is in "good mood, good spirits"  Secretions are improving, cough is minimal, denies SOB/Dyspnea/trouble breathing  Acting at baseline- seems to be doing ok overall  Oxygen levels are improved: 96-97% on RA  Vent at night- this is baseline- yesterday he only needed it in the morning for a little bit but that was all   And he is getting a breathing treatment right now  Breathing treatments about 2-4x/day and PRN  Vesting treatments- 4x/day and cough assist- doing ok with this.   Appearing normal and at his baseline per family   A little more tired than typical- but otherwise well   VSS checked today- BP130/72, HR 88, 18RR, 97.8  BG normal- last BG was 178 at 11am  Started on antibiotics over the weekend d/t chest xray results and sputum   cipro- received 2 doses so far and he has a full 14 days  No need for tylenol, ibuprofen  PO intake is good- at baseline  +urination  Received a suppository this morning- so his nurse states that he likely is constipated- but denies abdominal distention/pain/discomfort  Last BM was 3 days ago- this can be normal for him.       MEDICATIONS     Current Outpatient Medications    Medication Sig    ciprofloxacin (CIPRO) 500 MG tablet TAKE 1 TAB TWICE DAILY per gtube    lamoTRIgine (LAMICTAL) 25 MG tablet Administer 1 tablet (25 mg total) via G tube daily 1 TAB PER TUBE EVERY OTHER DAY X2WEEKS, THEN 1 TAB EVERY DAY X2WEEKS, THEN CONTACT DR. MAROLDO    senna (SENOKOT) 8.6 MG tablet Administer 1 tablet via G tube daily Crush 1 tablet via G-tube daily.  Hold for loose stools.    generic DME [employer] for use in wheelchair, dispense 1. Use as directed. Dx G80.0, S06.9x9, LON: lifetime    docusate sodium (COLACE) 10 mg/mL liquid Take 10 mLs (100 mg total) by mouth 2 times daily    sennosides (SENOKOT) 8.8 mg/62m syrup Take 10 mLs by mouth nightly HOLD for diarrhea    ipratropium (ATROVENT) 0.02 % nebulizer solution Inhale 2.5 mLs (500 mcg total) by nebulization 2 times daily Dx: J45.4    albuterol (PROVENTIL) (2.5 mg/356m 0.083% nebulizer solution Inhale 3 mLs (2.5 mg total) by nebulization 2 times daily Give twice daily with vesting    ibuprofen (ADVIL,MOTRIN) 600 MG tablet Take 1 tablet (600 mg total) by mouth 4 times daily as needed for Pain for up to 30 doses    B-D UF III MINI PEN NEEDLES 31G X 5  MM USE 3 (THREE) TIMES DAILY AS NEEDED.    baclofen (LIORESAL) 10 MG tablet TAKE 1 TABLET BY MOUTH TWICE A DAY    triamcinolone (KENALOG) 0.025 % cream Apply topically 2 times daily APPLY TO AFFECTED AREA    valproate (VALPROIC ACID) 50 mg/mL syrup Take 5 mLs (250 mg total) by mouth 3 times daily    insulin detemir (LEVEMIR FLEXPEN) 100 UNIT/ML injection pen Inject 80 units into the skin nightly    MIC-KEY gastrostomy feeding tube extension set 24" Use as directed, please dispense 4 per month    Syringe Disposable 60 ML MISC Use as directed    MIC-KEY gastrostomy kit 14FR Please dispense every 3 months and prn, MicKey 14Fr 2.5cm    gauze pads 2"X2" Use as directed around trach. MCAID ID [id number]    generic DME Adhesive tape 1" x 5 yards. Use as directed. Disp 3 box     insulin aspart (NOVOLOG FLEXPEN) 100 UNIT/ML injection pen Inject SQ before meals: Nutritional BL: 5 units + SS as follows: <70: hold BL, 70-200: BL, > 200: BL+2, >250: BL+4, >300: BL+6, >350: BL +8. Alert MD for BG <70 and >350.    budesonide (PULMICORT) 0.25 MG/2ML nebulizer solution Inhale 2 mLs (0.25 mg total) by nebulization 2 times daily Dx: J45.909    Nebulizers (PARI LC PLUS VIOS PRO NEB) MISC Pari Vios Pro Neb or comparable with Nebulizer kits and nebulizer trach mask. Use twice daily With albuterol.MCAID # [id number], MCARE #[id number]    fluticasone (FLONASE) 50 MCG/ACT nasal spray Spray 1 spray into nostril daily    tobramycin, PF, (TOBI) 300 MG/5ML nebulizer solution NEBULIZE 1 VIAL (300 MG) TWICE DAILY EVERY OTHER MONTH   DIAGNOSIS: E84.0    generic DME 22m Adapter for trach to be used as directed. Replace weekly. Dx.Z99.11    generic DME 15 mm adapters for nebulized therapies via trach  Medicare ID [id number] Medicaid ID [id number]   nebulizer device with mask and tubing Use as directed    famotidine (PEPCID) 20 MG tablet TAKE 1 TABLET BY MOUTH 2 (TWO) TIMES DAILY. OK TO CRUSH IT AND PUT IT DOWN TUBE IF NECESSARY    generic DME Please dispense one 559msyringe. Use as directed.    generic DME MCAID ID [id number] CUFFED TRACH 7.0 Please change trach every month and as needed.    Accu-Chek FastClix Lancets MISC 1 EACH BY MISC.(NON-DRUG COMBO ROUTE) ROUTE 3 (THREE) TIMES DAILY. E11.65    blood glucose (ACCU-CHEK AVIVA PLUS) test strip USE 1 STRIP 3 (THREE) TIMES DAILY and  AS NEEDED.Dx: E11.65 and Z79.4    generic DME Please dispense 3077measuring cup. Use as directed.    lactobacillus rhamnosus, GG, (CULTURELLE) capsule Take 1 capsule (1 each total) by mouth daily    Gauze Pads & Dressings (SPLIT GAUZE DRAINAGE SPONGE) 4"X4" Use once and PRN times a day as instructed.    generic DME 10 isothermal adult "antiflect" connector. MCAID [id number] as directed.    Nebulizer MISC  Disposable nebulizer kits to be used inline with trach  MCAID ID [id number] generic DME Suction catheter with chimney valve.Use as directed.MCAID ID [id number] bisacodyl (DULCOLAX) 10 MG suppository Please give suppository as needed  if no BM in 3 days, please check with Dad prior to giving    clonazePAM (KLONOPIN) 1 MG tablet TAKE 1 CRUSHED TABLET VIA G-TUBE AS NEEDED FOR CLUSTER SEIZURES, MAX 3 TABS BACK TO BACK  GUAIFENESIN PO Take 200 mg by mouth every 4 hours as needed (For cough or congestion)     generic DME Non-sterile gloves size medium Use as directed. MCAID ID [id number] 3boxes/month    zinc oxide (DESITIN) 40 % paste Apply topically as needed for Diaper Rash    generic DME tRACH TIES WITH O2 PORT. Use as directed.MCAID ID [id number]    sodium chloride 0.9 % nebulizer solution Take 3 mLs by nebulization as needed for Wheezing MCAID ID [id number]    gauze sponge (CURITY) 4"X4" pads Use PRN as instructed.MCAID ID [id number]    generic DME PASSEY MUIR VALVE (PURPLE) Use as tolerated.MCAID ID [id number]    Adhesive Tape 1"x5yd TAPE By 1 Units no specified route as needed MCAID ID [id number]    generic DME 10 ml syringes Use as directed.MCAID ID [id number]    generic DME Trach care kits Use as directed.MCAID ID [id number]    surgical lubricant (SURGILUBE) gel Apply topically as needed MCAID ID [id number]    Distilled Water LIQD Distilled water, use as directedMCAID ID [id number]    generic DME Mepilex 4X 4. Use as directed.MCAID ID [id number]    generic DME HME for trach humidification Use as directed.MCAID ID [id number]    generic DME Cough Assist circuits Use as directed.MCAID ID [id number]    generic DME Suction tubing Use as directed.MCAID ID [id number]    generic DME Suction canister Use as directed.MCAID ID [id number]    generic DME Suction filter Use as directed.MCAID ID [id number]    generic DME Nasal adapters Use as directed.MCAID ID [id number]    generic DME Pulse ox probes (disposable)Use as directed.MCAID  ID [id number]    diphenhydrAMINE (BENADRYL) 25 MG oral solid 25 mg by Per G Tube route nightly as needed for Sleep    FIBER SELECT GUMMIES PO Take by mouth Give 2 gummies daily    Multiple Vitamins-Minerals (HM MULTIVITAMIN ADULT GUMMY PO) Take by mouth Give 2 gummies daily    Ascorbic Acid (VITAMIN C ADULT GUMMIES PO) Take by mouth Give 2 gummies once daily    Cholecalciferol (VITAMIN D3) 50 MCG (2000 UT) capsule 2,000 Units by Per G Tube route daily    cetirizine (ZYRTEC) 1 MG/ML syrup 10 mg by Per G Tube route at bedtime    oxygen 2 l/m bleed through ventilator at night    Blood Glucose Monitoring Suppl (FIFTY50 GLUCOSE METER 2.0) w/Device KIT by Other route    EPINEPHrine 0.15 mg/0.3 mL auto-injector Inject 0.15 mg into the muscle    lactulose (LACTULOSE) 20 GM/30ML solution 10 g by Per G Tube route 3 times daily Hold dose for loose stools    levETIRAcetam (KEPPRA) 100 MG/ML solution GIVE 15 MLS BY PER G TUBE ROUTE 2 (TWO) TIMES DAILY.    miconazole (MICATIN) 2 % powder 2 times daily as needed     insulin pen needle (FIFTY50 PEN NEEDLES) 31G X 8 MM 1 each     Medications reviewed at today's visit       Objective     OBJECTIVE   PHYSICAL EXAM  This visit was performed during a pandemic event, and the physical exam was limited to my Video observation of this patient's organ systems and/or body areas.       Physical Exam  Constitutional:       General: He is not in acute distress.     Appearance: He is not diaphoretic.      Comments: Pleasant, laying in bed, awake  and nodding head occasionally to questions.      HENT:      Head: Normocephalic and atraumatic.      Comments: Nebulizer tube in place over trach with treatment occurring   Eyes:      General: No scleral icterus.  Neck:      Comments: Tracheostomy in place    Pulmonary:      Effort: Pulmonary effort is normal. No respiratory distress.   Musculoskeletal:      Cervical back: Normal range of motion and neck supple.   Skin:     General: Skin is  dry.   Neurological:      Mental Status: He is alert.   Psychiatric:         Mood and Affect: Mood and affect normal.              ASSESSMENT   Today we discussed the assessment and management of the following diagnosis:    ICD-10-CM ICD-9-CM   1. Pneumonia of right lower lobe due to infectious organism  J18.9 486   2. Type 2 diabetes mellitus with hyperglycemia, with long-term current use of insulin  E11.65 250.00    Z79.4 790.29     V58.67   3. Spastic quadriplegia  G82.50 344.00   4. Chronic respiratory failure with hypoxia  J96.11 518.83     799.02   5. TBI (traumatic brain injury)  S06.9X9A 854.00       PLAN      1. Pneumonia of right lower lobe due to infectious organism  Stable and improving- Amanda is feeling better today since starting oral cipro over the weekend   -continue cipro until fully completed- still awaiting sputum culture results to determine of this needs to be adjusted but I suspect not  -continue all nebulized medications and increase vesting/cough assist for at least 1 week then may return back to regular vesting schedule.     2. Type 2 diabetes mellitus with hyperglycemia, with long-term current use of insulin  Stable- no noticeable increase in BGs despite being ill  Continue all insulin as prescribed    3. Spastic quadriplegia  Underlying condition, all treatment decisions made with this in consideration      4. Chronic respiratory failure with hypoxia  Trach and ventilator at night- baseline- this is stable  Underlying condition, all treatment decisions made with this in consideration      5. TBI (traumatic brain injury)  Underlying condition, all treatment decisions made with this in consideration        6. Constipation, unspecified constipation type  Received suppository today or 3 days of no BM. This is reportedly not out of the ordinary for [place]. Nurse will continue to monitor progress and will follow BM protocol and notify our office if needed. No s/s of discomfort at this time.          No follow-ups on file.      Barbaraann Boys, NP

## 2019-12-22 NOTE — Telephone Encounter (Signed)
Incoming paperwork for [facility] to the [facility]  Date Received: 12/22/2019    Type of paperwork (sender:form type): [facility] Ultrasound imaging order    Completed paperwork to be sent to:  Fax to [phone]    Special Requests:     Placed in box for: [name]

## 2019-12-23 ENCOUNTER — Encounter: Payer: Self-pay | Admitting: Gastroenterology

## 2019-12-23 NOTE — Telephone Encounter (Signed)
Paperwork completed 12/23/2019  Sent as outlined in prior documentation

## 2019-12-24 LAB — AEROBIC CULTURE: Aerobic Culture: 0

## 2019-12-25 ENCOUNTER — Ambulatory Visit: Payer: Medicare Other | Admitting: Rehabilitative and Restorative Service Providers"

## 2019-12-26 ENCOUNTER — Encounter: Payer: Self-pay | Admitting: Pediatrics

## 2019-12-26 NOTE — Telephone Encounter (Signed)
[facility]- Wilford Corner RN, sent over some papers in regards to this. She is requesting a call to Mr Detter

## 2019-12-29 ENCOUNTER — Other Ambulatory Visit: Payer: Self-pay | Admitting: Pediatrics

## 2019-12-29 DIAGNOSIS — IMO0002 Reserved for concepts with insufficient information to code with codable children: Secondary | ICD-10-CM

## 2019-12-29 DIAGNOSIS — Z794 Long term (current) use of insulin: Secondary | ICD-10-CM

## 2019-12-30 LAB — LEGIONELLA CULTURE: Legionella Culture: 0

## 2020-01-05 ENCOUNTER — Ambulatory Visit: Payer: Medicare Other | Attending: Pediatrics | Admitting: Rehabilitative and Restorative Service Providers"

## 2020-01-05 DIAGNOSIS — G8250 Quadriplegia, unspecified: Secondary | ICD-10-CM | POA: Insufficient documentation
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
## 2020-01-05 NOTE — Progress Notes (Signed)
[facility]   Physical Therapy Note      01/05/20 1400   Overview   Diagnosis Spastic quadriplegia   Insurance Medicare   Script Date 12/02/19   Visit # 1   Accompanied by Nurse   Additional Comments See initial evaluation for details   Pain Assessment   Pain Denies   Patient Education   Educated in disease process Yes   Educated in proper posture and body mechanics Yes   Educated in home exercise program Yes   Additional Patient Education HEP- Caregiver assisted-  hip and knee extension and flexion PROM, hip IR/ER, hip abd/add, butterfly stretch, supine hamstring 90-90 stretch, ankle DF ROM   Time Calculations   PT Timed Codes 5   PT Untimed Codes 25   PT Total Treatment 30   Marcelyn Bruins, SPT

## 2020-01-13 ENCOUNTER — Encounter: Payer: Self-pay | Admitting: Gastroenterology

## 2020-01-13 ENCOUNTER — Encounter: Payer: Self-pay | Admitting: Neurology

## 2020-01-13 ENCOUNTER — Telehealth: Payer: Self-pay | Admitting: Neurology

## 2020-01-13 MED ORDER — LAMOTRIGINE 25 MG PO TABS *I*
ORAL_TABLET | ORAL | 0 refills | Status: DC
Start: 2020-01-13 — End: 2020-01-26

## 2020-01-13 NOTE — Telephone Encounter (Signed)
earlier today there was communication that b/o behavior changes father wanted to d/c lamictal 25 mg daily b/o behavior changes. Was instructed to go ahead and d/c that and no taper needed.    nurses called this evening - they need verbal order and order faxed, and they feel lamictal has reduced seizures a lot and should be continued.    I explained that father, as medical power, has authority to request discontinuation, and if in retrospect he was better with it, it could be resumed after Dr Latanya Presser and father discuss that.

## 2020-01-14 NOTE — Telephone Encounter (Signed)
Letter written and fax internally to [employer]

## 2020-01-15 NOTE — Telephone Encounter (Signed)
Letter did not appear to have sent. Sent now.

## 2020-01-15 NOTE — Telephone Encounter (Signed)
Letter faxed.

## 2020-01-19 ENCOUNTER — Ambulatory Visit: Payer: Medicare Other

## 2020-01-19 NOTE — Progress Notes (Deleted)
[facility] failure-nocturnal vent dependent.     He was last seen in our on 09/02/2018     He was recently treated for pneumonia and has recovered to baseline.  He is without respiratory complaints today. He is tolerating his new ventilator- no changes have been made to his settings. His home nurse believes he is "breathing better" since starting Trilogy.      ROS:  Denies headache, increasing daytime somnolence, visual disturbances.  Denies hallucinations or confusion.  Trach tube changes are routinely done by home nurse- done every two weeks  #7.0, BivonaT.T.Strach tube  No ventilator settings changes since last visit.  FiO22 L, SIMV/PC-21/PS-13,rate10vT 310PEEP 5- nocturnal and PRN use with 2 LPM O2 bleed  Ventilator vendor is [employer]  No recent weight changes or development/increase if lower extremity edema       PHYSICAL EXAM & DATA   There were no vitals taken for this visit. on RA     Gen: NAD,surgical mask in place   HEENT: Tracheostomy tube in place, no stridor,   Resp: No increased WOB, insp/exp rhonchi through out-no change from last visit  Card: S1S2 without murmurs, normal jugular venous pressure, and no pedal edema.    Abd: Soft, NT, ND, normoactive BS  MSK: No cyanosis, clubbing, or joint tenderness-does not move lower extremities, has limited movement of upper extremities.  Has significant scoliosis  Neuro: No focal motor or sensory defects.       Assessment/Plan:  Angel Wells a 29 y.o.malewith a past medical history of chronic respiratory failure and hypoxia, nocturnal vent dependence, asthma, diabetes, GERD, seizures, and a TBI. He has completely recovered from recent pneumonia.  I provided home nurse with sputum traps- if he develops increased sputum production in the future they will be able to send a sample into lab. He should continue current vent setting and inhaler regiment.  He will return to our clinic on an as  needed basis.     Recommendations:  -Continue wrap vest BID  -Continue nebulized budesonide   -Continue as need albuterol BID  -Agree with discontinuing duo-nebs for now  -Continue current vent settings  -RTC in 1 year for follow up in Vent clinic     Kenna Gilbert, NP   12:29 PM  01/19/2020

## 2020-01-20 ENCOUNTER — Telehealth: Payer: Self-pay

## 2020-01-20 NOTE — Telephone Encounter (Signed)
Incoming paperwork for completion to the [facility]  Date Received: 01/20/2020    Type of paperwork (sender:form type): [employer] health certification and plan of care    Completed paperwork to be sent to:  Fax to [phone]    Special Requests:     Placed in box for: [name]

## 2020-01-20 NOTE — Telephone Encounter (Signed)
Incoming paperwork for completion to the [facility]  Date Received: 01/20/2020    Type of paperwork (sender:form type): [employer]    Completed paperwork to be sent to:  Fax to [phone]    Special Requests:     Placed in box for: [name]

## 2020-01-21 ENCOUNTER — Encounter: Payer: Self-pay | Admitting: Gastroenterology

## 2020-01-21 NOTE — Telephone Encounter (Signed)
Paperwork completed 01/21/2020  Sent as outlined in prior documentation

## 2020-01-23 ENCOUNTER — Telehealth: Payer: Self-pay | Admitting: Neurology

## 2020-01-23 NOTE — Telephone Encounter (Signed)
Received order from [employer] to be signed. Placed in Lindsay's mailbox.

## 2020-01-26 ENCOUNTER — Telehealth: Payer: Self-pay | Admitting: Pediatrics

## 2020-01-26 ENCOUNTER — Telehealth: Payer: Self-pay | Admitting: Neurology

## 2020-01-26 DIAGNOSIS — G40219 Localization-related (focal) (partial) symptomatic epilepsy and epileptic syndromes with complex partial seizures, intractable, without status epilepticus: Secondary | ICD-10-CM

## 2020-01-26 MED ORDER — LACOSAMIDE 10 MG/ML PO SOLN *I*
ORAL | 5 refills | Status: DC
Start: 2020-01-26 — End: 2020-02-16

## 2020-01-26 NOTE — Telephone Encounter (Signed)
[facility] TELEPHONE INTAKE    Reason for call: [name] called to report patient is having difficulty urinating. Yesterday went 10-12 hours without relief, also experiencing pain and had a seizure where he bit his tongue hard enough to bleed.  Would like an order for UA and would need supplies to straight cath him.      Name of caller: Worthy Keeler (if applicable): [facility]  Phone: [phone]  Fax (if applicable):  [phone]

## 2020-01-26 NOTE — Telephone Encounter (Signed)
I reviewed the document that was received.  It is not an order to be signed, but rather a message from his nursing staff.  His nursing staff also called today and we will address the issue by telephone.  There is nothing more to do with the document.  Thank you.

## 2020-01-26 NOTE — Addendum Note (Signed)
Addended by: Azucena Kuba on: 01/26/2020 06:16 PM     Modules accepted: Orders

## 2020-01-26 NOTE — Telephone Encounter (Signed)
[name] from [facility] calling in stating that the patient recently had a seizure where he bit his tongue hard enough for it to bleed and that he has been having problems urinating. She states that he will go 10-12 hours without urinating.     She would also like to know if there is a different seizure medication that he can take now that his lamoTRIgine (LAMICTAL) 25 MG tablet was discontinued.     Please advise and call back at [phone] or [phone]    Fax: [phone]

## 2020-01-26 NOTE — Telephone Encounter (Signed)
I spoke with [name] and she asked if there is another medication that could be tried in place of the lamotrigine.  I told her that I will get in touch with Mr. Whidbee to consider next steps and get his consent.

## 2020-01-27 ENCOUNTER — Ambulatory Visit: Payer: Medicare Other | Admitting: Rehabilitative and Restorative Service Providers"

## 2020-01-27 ENCOUNTER — Telehealth: Payer: Self-pay | Admitting: Neurology

## 2020-01-27 ENCOUNTER — Ambulatory Visit: Payer: Medicare Other | Attending: Internal Medicine | Admitting: Internal Medicine

## 2020-01-27 DIAGNOSIS — E1165 Type 2 diabetes mellitus with hyperglycemia: Secondary | ICD-10-CM | POA: Insufficient documentation

## 2020-01-27 DIAGNOSIS — S069X9A Unspecified intracranial injury with loss of consciousness of unspecified duration, initial encounter: Secondary | ICD-10-CM | POA: Insufficient documentation

## 2020-01-27 DIAGNOSIS — R0602 Shortness of breath: Secondary | ICD-10-CM | POA: Insufficient documentation

## 2020-01-27 DIAGNOSIS — S069XAA Unspecified intracranial injury with loss of consciousness status unknown, initial encounter: Secondary | ICD-10-CM

## 2020-01-27 DIAGNOSIS — R093 Abnormal sputum: Secondary | ICD-10-CM | POA: Insufficient documentation

## 2020-01-27 DIAGNOSIS — J9611 Chronic respiratory failure with hypoxia: Secondary | ICD-10-CM | POA: Insufficient documentation

## 2020-01-27 DIAGNOSIS — G8250 Quadriplegia, unspecified: Secondary | ICD-10-CM | POA: Insufficient documentation

## 2020-01-27 DIAGNOSIS — Z794 Long term (current) use of insulin: Secondary | ICD-10-CM | POA: Insufficient documentation

## 2020-01-27 MED ORDER — GUAIFENESIN 100 MG/5ML PO SYRUP *WRAPPED*
400.0000 mg | ORAL_SOLUTION | Freq: Four times a day (QID) | ORAL | 0 refills | Status: AC
Start: 2020-01-27 — End: 2020-01-31

## 2020-01-27 NOTE — Telephone Encounter (Signed)
Please let the pharmacist know that is fine.  Thank you.

## 2020-01-27 NOTE — Patient Instructions (Incomplete)
Fax nursing orders to: [phone] attention [name].     Guaifenesin 400mg  g-tube 4x/day while awake until Friday and then return to PRN dosing   Chest xray- ultra mobile   Sputum sample- bring sample to [facility] outpatient lab   Increase albuterol neb, saline neb, with vesting/cough assist or suctioning to 4x/day   Continue all other inhaled medications the other two times/day   Follow up Friday at 11:30am for telemedicine visit for reassessment

## 2020-01-27 NOTE — Telephone Encounter (Signed)
Misty Stanley calling from [facility], states the recent script sent in for lacosamide (VIMPAT) 10 mg/mL SOLN oral solution is technically going to be filled for 37 days. Because of this she just needs to get the okay to add the code to fill the script.    Please call back at [phone]

## 2020-01-27 NOTE — Telephone Encounter (Signed)
I spoke with Misty Stanley at [facility], I relayed your response to her previous phone call.

## 2020-01-27 NOTE — Progress Notes (Signed)
[place] COMPLAINT     Chief Complaint   Patient presents with    Shortness of Breath       Subjective     TELEMEDICINE CONSENT     Visit being conducted by Video in lieu of a face to face visit to minimize health care worker and patient exposure to COVID-19, and conserve PPE.    Location of Patient: home  Location of Telemedicine Provider: hospital / clinical location  Other Participants in telemedicine encounter and roles: Nurse- shelly and father    Consent was obtained from the patient to complete this telemedicine visit; including the potential for financial liability.  How did the patient provide consent? Verbal Consent Only    SUBJECTIVE     SOB  increased SOB with increase secretions over the last week   Usually has some but not it is thicker and "creamy"  Vent with 1L oxygen and usually is at 93%  During the day the nurse started immediately with vesting and nebs   Oxygen is around 90-91%  During the day- not needed oxygen or vent during the day  His normal is 97-98% during the day  Albuterol,   +wheezing and "moist" sounding   Started tobramycin on 01/17/20 - just trying to get "on top of things now"   They use the vest BID and he will ask for suctioning and cough assist   Giving him some guaifenesin   No other sick contacts   Denies fevers  Po intake is good- appetite is good  +urination is good  Chronic constipation   Not acting at his baseline- he is not his normal happy self       Blood sugars:   Running higher than normal for the last 5 days   Lowest was 180 in the last 5 days - only because he was not eating much because he was sleepig a lot more averaging around 250-   typical when he is sick  previously was on Lamictal and this can increase sugars but that is unrelated to this event  Wants to hold on any adjustments for now- he has routine and SS dose    MEDICATIONS     Current Outpatient Medications   Medication Sig    guaiFENesin (GUAIFENESIN)  100 MG/5ML syrup Administer 20 mLs (400 mg total) via G tube 4 times daily for 4 days Give while awake. After Friday return to PRN dosing    lacosamide (VIMPAT) 10 mg/mL SOLN oral solution 5 mL (50 mg) per tube twice daily for two weeks, then 10 mL (100 mg) per tube twice daily.    ACCU-CHEK AVIVA PLUS test strip USE 1 STRIP 3 (THREE) TIMES DAILY AND AS NEEDED.DX: E11.65 AND Z79.4    senna (SENOKOT) 8.6 MG tablet Administer 1 tablet via G tube daily Crush 1 tablet via G-tube daily.  Hold for loose stools.    generic DME Butterfly Harness for use in wheelchair, dispense 1. Use as directed. Dx G80.0, S06.9x9, LON: lifetime    docusate sodium (COLACE) 10 mg/mL liquid Take 10 mLs (100 mg total) by mouth 2 times daily    sennosides (SENOKOT) 8.8 mg/73m syrup Take 10 mLs by mouth nightly HOLD for diarrhea    ipratropium (ATROVENT) 0.02 % nebulizer solution Inhale 2.5 mLs (500 mcg total) by nebulization 2 times daily Dx: J45.4    albuterol (PROVENTIL) (2.5 mg/327m 0.083% nebulizer solution Inhale 3 mLs (2.5  mg total) by nebulization 2 times daily Give twice daily with vesting    ibuprofen (ADVIL,MOTRIN) 600 MG tablet Take 1 tablet (600 mg total) by mouth 4 times daily as needed for Pain for up to 30 doses    B-D UF III MINI PEN NEEDLES 31G X 5 MM USE 3 (THREE) TIMES DAILY AS NEEDED.    baclofen (LIORESAL) 10 MG tablet TAKE 1 TABLET BY MOUTH TWICE A DAY    triamcinolone (KENALOG) 0.025 % cream Apply topically 2 times daily APPLY TO AFFECTED AREA    valproate (VALPROIC ACID) 50 mg/mL syrup Take 5 mLs (250 mg total) by mouth 3 times daily    insulin detemir (LEVEMIR FLEXPEN) 100 UNIT/ML injection pen Inject 80 units into the skin nightly    MIC-KEY gastrostomy feeding tube extension set 24" Use as directed, please dispense 4 per month    Syringe Disposable 60 ML MISC Use as directed    MIC-KEY gastrostomy kit 14FR Please dispense every 3 months and prn, MicKey 14Fr 2.5cm    gauze pads 2"X2" Use as directed  around trach. MCAID ID [id number]    generic DME Adhesive tape 1" x 5 yards. Use as directed. Disp 3 box    insulin aspart (NOVOLOG FLEXPEN) 100 UNIT/ML injection pen Inject SQ before meals: Nutritional BL: 5 units + SS as follows: <70: hold BL, 70-200: BL, > 200: BL+2, >250: BL+4, >300: BL+6, >350: BL +8. Alert MD for BG <70 and >350.    budesonide (PULMICORT) 0.25 MG/2ML nebulizer solution Inhale 2 mLs (0.25 mg total) by nebulization 2 times daily Dx: J45.909    Nebulizers (PARI LC PLUS VIOS PRO NEB) MISC Pari Vios Pro Neb or comparable with Nebulizer kits and nebulizer trach mask. Use twice daily With albuterol.MCAID # [id number], MCARE #[id number]    fluticasone (FLONASE) 50 MCG/ACT nasal spray Spray 1 spray into nostril daily    tobramycin, PF, (TOBI) 300 MG/5ML nebulizer solution NEBULIZE 1 VIAL (300 MG) TWICE DAILY EVERY OTHER MONTH   DIAGNOSIS: E84.0    generic DME 37m Adapter for trach to be used as directed. Replace weekly. Dx.Z99.11    generic DME 15 mm adapters for nebulized therapies via trach  Medicare ID [id number] Medicaid ID [id number]   nebulizer device with mask and tubing Use as directed    famotidine (PEPCID) 20 MG tablet TAKE 1 TABLET BY MOUTH 2 (TWO) TIMES DAILY. OK TO CRUSH IT AND PUT IT DOWN TUBE IF NECESSARY    generic DME Please dispense one 560msyringe. Use as directed.    generic DME MCAID ID [id number] CUFFED TRACH 7.0 Please change trach every month and as needed.    Accu-Chek FastClix Lancets MISC 1 EACH BY MISC.(NON-DRUG COMBO ROUTE) ROUTE 3 (THREE) TIMES DAILY. E11.65    generic DME Please dispense 3055measuring cup. Use as directed.    lactobacillus rhamnosus, GG, (CULTURELLE) capsule Take 1 capsule (1 each total) by mouth daily    Gauze Pads & Dressings (SPLIT GAUZE DRAINAGE SPONGE) 4"X4" Use once and PRN times a day as instructed.    generic DME 10 isothermal adult "antiflect" connector. MCAID [id number] as directed.    Nebulizer MISC Disposable  nebulizer kits to be used inline with trach  MCAID ID [id number] generic DME Suction catheter with chimney valve.Use as directed.MCAID ID [id number] bisacodyl (DULCOLAX) 10 MG suppository Please give suppository as needed  if no BM in 3 days, please check with Dad  prior to giving    clonazePAM (KLONOPIN) 1 MG tablet TAKE 1 CRUSHED TABLET VIA G-TUBE AS NEEDED FOR CLUSTER SEIZURES, MAX 3 TABS BACK TO BACK    generic DME Non-sterile gloves size medium Use as directed. MCAID ID [id number] 3boxes/month    zinc oxide (DESITIN) 40 % paste Apply topically as needed for Diaper Rash    generic DME tRACH TIES WITH O2 PORT. Use as directed.MCAID ID [id number]    sodium chloride 0.9 % nebulizer solution Take 3 mLs by nebulization as needed for Wheezing MCAID ID [id number]    gauze sponge (CURITY) 4"X4" pads Use PRN as instructed.MCAID ID [id number]    generic DME PASSEY MUIR VALVE (PURPLE) Use as tolerated.MCAID ID [id number]    Adhesive Tape 1"x5yd TAPE By 1 Units no specified route as needed MCAID ID [id number]    generic DME 10 ml syringes Use as directed.MCAID ID [id number]    generic DME Trach care kits Use as directed.MCAID ID [id number]    surgical lubricant (SURGILUBE) gel Apply topically as needed MCAID ID [id number]    Distilled Water LIQD Distilled water, use as directedMCAID ID [id number]    generic DME Mepilex 4X 4. Use as directed.MCAID ID [id number]    generic DME HME for trach humidification Use as directed.MCAID ID [id number]    generic DME Cough Assist circuits Use as directed.MCAID ID [id number]    generic DME Suction tubing Use as directed.MCAID ID [id number]    generic DME Suction canister Use as directed.MCAID ID [id number]    generic DME Suction filter Use as directed.MCAID ID [id number]    generic DME Nasal adapters Use as directed.MCAID ID [id number]    generic DME Pulse ox probes (disposable)Use as directed.MCAID ID [id number]    diphenhydrAMINE (BENADRYL) 25 MG oral solid 25 mg by Per G Tube route nightly as  needed for Sleep    FIBER SELECT GUMMIES PO Take by mouth Give 2 gummies daily    Multiple Vitamins-Minerals (HM MULTIVITAMIN ADULT GUMMY PO) Take by mouth Give 2 gummies daily    Ascorbic Acid (VITAMIN C ADULT GUMMIES PO) Take by mouth Give 2 gummies once daily    Cholecalciferol (VITAMIN D3) 50 MCG (2000 UT) capsule 2,000 Units by Per G Tube route daily    cetirizine (ZYRTEC) 1 MG/ML syrup 10 mg by Per G Tube route at bedtime    oxygen 2 l/m bleed through ventilator at night    Blood Glucose Monitoring Suppl (FIFTY50 GLUCOSE METER 2.0) w/Device KIT by Other route    EPINEPHrine 0.15 mg/0.3 mL auto-injector Inject 0.15 mg into the muscle    lactulose (LACTULOSE) 20 GM/30ML solution 10 g by Per G Tube route 3 times daily Hold dose for loose stools    levETIRAcetam (KEPPRA) 100 MG/ML solution GIVE 15 MLS BY PER G TUBE ROUTE 2 (TWO) TIMES DAILY.    miconazole (MICATIN) 2 % powder 2 times daily as needed     insulin pen needle (FIFTY50 PEN NEEDLES) 31G X 8 MM 1 each     Medications reviewed at today's visit       Objective     OBJECTIVE   PHYSICAL EXAM  This visit was performed during a pandemic event, and the physical exam was limited to my Video observation of this patient's organ systems and/or body areas.       Physical Exam  Constitutional:       General: He is not in acute distress.     Appearance: He is not diaphoretic.  Comments: Sleeping comfortable. Audible wheezing heard over video   HENT:      Head: Normocephalic and atraumatic.   Eyes:      General: No scleral icterus.  Pulmonary:      Effort: Pulmonary effort is normal. No respiratory distress.      Breath sounds: Wheezing present.      Comments: Trach in place.   Musculoskeletal:         General: Normal range of motion.      Cervical back: Normal range of motion and neck supple.   Skin:     General: Skin is dry.   Neurological:      General: No focal deficit present.      Mental Status: He is alert.   Psychiatric:         Mood and Affect:  Affect normal.              ASSESSMENT   Today we discussed the assessment and management of the following diagnosis:    ICD-10-CM ICD-9-CM   1. SOB (shortness of breath)  R06.02 786.05   2. Increased sputum production  R09.3 786.4   3. Type 2 diabetes mellitus with hyperglycemia, with long-term current use of insulin  E11.65 250.00    Z79.4 790.29     V58.67   4. Spastic quadriplegia  G82.50 344.00   5. TBI (traumatic brain injury)  S06.9X9A 854.00   6. Chronic respiratory failure with hypoxia  J96.11 518.83     799.02       PLAN      1. SOB (shortness of breath)  Increase albuterol/saline nebs with vesting and cough assist/suctioning to 4x/day. Instructed to continue budesonide, atrovent, and tobramycin as currently ordered BID.   -chest xray- ultramobile   - Aerobic culture; Future- sputum  -guaifenesin 462m 4x/day until Friday  -follow up Friday for reassessment     2. Increased sputum production  Plan as above     3. Type 2 diabetes mellitus with hyperglycemia, with long-term current use of insulin  Hyperglycemia- worse with illness   Continue to monitor and utilize SS appropriately - will reassess after JDionis Is feeling better     4. Spastic quadriplegia  Underlying condition, all treatment decisions made with this in consideration  r    5. TBI (traumatic brain injury)  Underlying condition, all treatment decisions made with this in consideration      6. Chronic respiratory failure with hypoxia  Trach and vented nightly- has not needed to increase supplemental oxygen at this time- but O2sats have been lower than normal   -continue to monitor and will have low threshold for adding abx if worsening.          Orders Placed This Encounter    Aerobic culture    Chest single frontal view    guaiFENesin (GUAIFENESIN) 100 MG/5ML syrup       Patient Instructions   Fax nursing orders to: [phone] [name].     Guaifenesin 4047mg-tube 4x/day while awake until Friday and then return to PRN dosing   Chest  xray- ultra mobile   [facility]   Increase albuterol neb, saline neb, with vesting/cough assist or suctioning to 4x/day   Continue all other inhaled medications the other two times/day   Follow up Friday at 11:30am for telemedicine visit for reassessment              No follow-ups on file.  Barbaraann Boys, NP

## 2020-01-28 ENCOUNTER — Other Ambulatory Visit: Payer: Self-pay | Admitting: Gastroenterology

## 2020-01-28 ENCOUNTER — Other Ambulatory Visit
Admission: RE | Admit: 2020-01-28 | Discharge: 2020-01-28 | Disposition: A | Discharge status: Home / Self Care | Payer: Medicare Other | Source: Ambulatory Visit | Attending: Pediatrics | Admitting: Pediatrics

## 2020-01-28 DIAGNOSIS — R0902 Hypoxemia: Secondary | ICD-10-CM | POA: Insufficient documentation

## 2020-01-28 DIAGNOSIS — R739 Hyperglycemia, unspecified: Secondary | ICD-10-CM | POA: Insufficient documentation

## 2020-01-28 LAB — GRAM STAIN

## 2020-01-28 LAB — AEROBIC CULTURE

## 2020-01-28 NOTE — Telephone Encounter (Signed)
Had visit yesterday. Has fuv on Friday as well.

## 2020-01-29 ENCOUNTER — Telehealth: Payer: Self-pay | Admitting: Internal Medicine

## 2020-01-29 NOTE — Telephone Encounter (Signed)
01/29/20   5:07 PM    Nurse from [employer] Called asking for results from chest xray and sputum results.   Reviewed chest xray and sputum results  Will follow up with Ivin Booty tomorrow at his appointment     Jodi Geralds, NP

## 2020-01-30 ENCOUNTER — Telehealth: Payer: Self-pay

## 2020-01-30 ENCOUNTER — Telehealth: Payer: Medicare Other | Admitting: Internal Medicine

## 2020-01-30 ENCOUNTER — Ambulatory Visit: Payer: Medicare Other | Attending: Internal Medicine | Admitting: Internal Medicine

## 2020-01-30 DIAGNOSIS — E1165 Type 2 diabetes mellitus with hyperglycemia: Secondary | ICD-10-CM | POA: Insufficient documentation

## 2020-01-30 DIAGNOSIS — R093 Abnormal sputum: Secondary | ICD-10-CM | POA: Insufficient documentation

## 2020-01-30 DIAGNOSIS — G8250 Quadriplegia, unspecified: Secondary | ICD-10-CM | POA: Insufficient documentation

## 2020-01-30 DIAGNOSIS — R0602 Shortness of breath: Secondary | ICD-10-CM | POA: Insufficient documentation

## 2020-01-30 DIAGNOSIS — Z794 Long term (current) use of insulin: Secondary | ICD-10-CM | POA: Insufficient documentation

## 2020-01-30 DIAGNOSIS — S069XAA Unspecified intracranial injury with loss of consciousness status unknown, initial encounter: Secondary | ICD-10-CM

## 2020-01-30 DIAGNOSIS — S069X9A Unspecified intracranial injury with loss of consciousness of unspecified duration, initial encounter: Secondary | ICD-10-CM | POA: Insufficient documentation

## 2020-01-30 NOTE — Progress Notes (Signed)
[place] COMPLAINT     Chief Complaint   Patient presents with    Follow-up       Subjective     TELEMEDICINE CONSENT     Visit being conducted by Phone in lieu of a face to face visit to minimize health care worker and patient exposure to COVID-19, and conserve PPE.    Location of Patient: home  Location of Telemedicine Provider: hospital / clinical location  Other Participants in telemedicine encounter and roles: Father and Shelly (nurse)    Consent was obtained from the patient to complete this telemedicine visit; including the potential for financial liability.  How did the patient provide consent? Verbal Consent Only    SUBJECTIVE     SOB/increased secretions:   "he's doing pretty good"  Improving overall and doing much better  Personality is getting back to his baseline   Secretions improved   No thick secretions and color is back to normal   She feels like he is about 95% better and almost back to his baseline   No wheezing or rhonchi  Oxygen sats have been normal- except when he gets mad and holds his breath- which is normal for him to sometimes "have an attitude"  Vent with 1L O2 at night- baseline- this is good      Blood sugars:   Need to readdress his insulin regimen with Dr. Venetia Night he continues to be higher than normal but they are better than it was before this week   Running elevated but they think this is unrelated to illness      MEDICATIONS     Current Outpatient Medications   Medication Sig    lacosamide (VIMPAT) 10 mg/mL SOLN oral solution 5 mL (50 mg) per tube twice daily for two weeks, then 10 mL (100 mg) per tube twice daily.    ACCU-CHEK AVIVA PLUS test strip USE 1 STRIP 3 (THREE) TIMES DAILY AND AS NEEDED.DX: E11.65 AND Z79.4    senna (SENOKOT) 8.6 MG tablet Administer 1 tablet via G tube daily Crush 1 tablet via G-tube daily.  Hold for loose stools.    generic DME Butterfly Harness for use in wheelchair, dispense 1. Use as  directed. Dx G80.0, S06.9x9, LON: lifetime    docusate sodium (COLACE) 10 mg/mL liquid Take 10 mLs (100 mg total) by mouth 2 times daily    sennosides (SENOKOT) 8.8 mg/63m syrup Take 10 mLs by mouth nightly HOLD for diarrhea    ipratropium (ATROVENT) 0.02 % nebulizer solution Inhale 2.5 mLs (500 mcg total) by nebulization 2 times daily Dx: J45.4    albuterol (PROVENTIL) (2.5 mg/341m 0.083% nebulizer solution Inhale 3 mLs (2.5 mg total) by nebulization 2 times daily Give twice daily with vesting    ibuprofen (ADVIL,MOTRIN) 600 MG tablet Take 1 tablet (600 mg total) by mouth 4 times daily as needed for Pain for up to 30 doses    B-D UF III MINI PEN NEEDLES 31G X 5 MM USE 3 (THREE) TIMES DAILY AS NEEDED.    baclofen (LIORESAL) 10 MG tablet TAKE 1 TABLET BY MOUTH TWICE A DAY    triamcinolone (KENALOG) 0.025 % cream Apply topically 2 times daily APPLY TO AFFECTED AREA    valproate (VALPROIC ACID) 50 mg/mL syrup Take 5 mLs (250 mg total) by mouth 3 times daily    insulin detemir (LEVEMIR FLEXPEN) 100 UNIT/ML injection pen Inject 80 units into the skin  nightly    MIC-KEY gastrostomy feeding tube extension set 24" Use as directed, please dispense 4 per month    Syringe Disposable 60 ML MISC Use as directed    MIC-KEY gastrostomy kit 14FR Please dispense every 3 months and prn, MicKey 14Fr 2.5cm    gauze pads 2"X2" Use as directed around trach. MCAID ID [id number]    generic DME Adhesive tape 1" x 5 yards. Use as directed. Disp 3 box    insulin aspart (NOVOLOG FLEXPEN) 100 UNIT/ML injection pen Inject SQ before meals: Nutritional BL: 5 units + SS as follows: <70: hold BL, 70-200: BL, > 200: BL+2, >250: BL+4, >300: BL+6, >350: BL +8. Alert MD for BG <70 and >350.    budesonide (PULMICORT) 0.25 MG/2ML nebulizer solution Inhale 2 mLs (0.25 mg total) by nebulization 2 times daily Dx: J45.909    Nebulizers (PARI LC PLUS VIOS PRO NEB) MISC Pari Vios Pro Neb or comparable with Nebulizer kits and nebulizer trach  mask. Use twice daily With albuterol.MCAID # [id number], MCARE #[id number]    fluticasone (FLONASE) 50 MCG/ACT nasal spray Spray 1 spray into nostril daily    tobramycin, PF, (TOBI) 300 MG/5ML nebulizer solution NEBULIZE 1 VIAL (300 MG) TWICE DAILY EVERY OTHER MONTH   DIAGNOSIS: E84.0    generic DME 31m Adapter for trach to be used as directed. Replace weekly. Dx.Z99.11    generic DME 15 mm adapters for nebulized therapies via trach  Medicare ID [id number] Medicaid ID [id number]   nebulizer device with mask and tubing Use as directed    famotidine (PEPCID) 20 MG tablet TAKE 1 TABLET BY MOUTH 2 (TWO) TIMES DAILY. OK TO CRUSH IT AND PUT IT DOWN TUBE IF NECESSARY    generic DME Please dispense one 541msyringe. Use as directed.    generic DME MCAID ID [id number] CUFFED TRACH 7.0 Please change trach every month and as needed.    Accu-Chek FastClix Lancets MISC 1 EACH BY MISC.(NON-DRUG COMBO ROUTE) ROUTE 3 (THREE) TIMES DAILY. E11.65    generic DME Please dispense 3073measuring cup. Use as directed.    lactobacillus rhamnosus, GG, (CULTURELLE) capsule Take 1 capsule (1 each total) by mouth daily    Gauze Pads & Dressings (SPLIT GAUZE DRAINAGE SPONGE) 4"X4" Use once and PRN times a day as instructed.    generic DME 10 isothermal adult "antiflect" connector. MCAID [id number] as directed.    Nebulizer MISC Disposable nebulizer kits to be used inline with trach  MCAID ID [id number] generic DME Suction catheter with chimney valve.Use as directed.MCAID ID [id number] bisacodyl (DULCOLAX) 10 MG suppository Please give suppository as needed  if no BM in 3 days, please check with Dad prior to giving    clonazePAM (KLONOPIN) 1 MG tablet TAKE 1 CRUSHED TABLET VIA G-TUBE AS NEEDED FOR CLUSTER SEIZURES, MAX 3 TABS BACK TO BACK    generic DME Non-sterile gloves size medium Use as directed. MCAID ID [id number]/month    zinc oxide (DESITIN) 40 % paste Apply topically as needed for Diaper Rash     generic DME tRACH TIES WITH O2 PORT. Use as directed.MCAID ID [id number] sodium chloride 0.9 % nebulizer solution Take 3 mLs by nebulization as needed for Wheezing MCAID ID [id number] gauze sponge (CURITY) 4"X4" pads Use PRN as instructed.MCAID ID [id number] generic DME PASSEY MUIR VALVE (PURPLE) Use as tolerated.MCAID ID [id number] Adhesive Tape 1"x5yd  TAPE By 1 Units no specified route as needed MCAID ID [id number]    generic DME 10 ml syringes Use as directed.MCAID ID [id number]    generic DME Trach care kits Use as directed.MCAID ID [id number]    surgical lubricant (SURGILUBE) gel Apply topically as needed MCAID ID [id number]    Distilled Water LIQD Distilled water, use as directedMCAID ID [id number]    generic DME Mepilex 4X 4. Use as directed.MCAID ID [id number]    generic DME HME for trach humidification Use as directed.MCAID ID [id number]    generic DME Cough Assist circuits Use as directed.MCAID ID [id number]    generic DME Suction tubing Use as directed.MCAID ID [id number]    generic DME Suction canister Use as directed.MCAID ID [id number]    generic DME Suction filter Use as directed.MCAID ID [id number]    generic DME Nasal adapters Use as directed.MCAID ID [id number]    generic DME Pulse ox probes (disposable)Use as directed.MCAID ID [id number]    diphenhydrAMINE (BENADRYL) 25 MG oral solid 25 mg by Per G Tube route nightly as needed for Sleep    FIBER SELECT GUMMIES PO Take by mouth Give 2 gummies daily    Multiple Vitamins-Minerals (HM MULTIVITAMIN ADULT GUMMY PO) Take by mouth Give 2 gummies daily    Ascorbic Acid (VITAMIN C ADULT GUMMIES PO) Take by mouth Give 2 gummies once daily    Cholecalciferol (VITAMIN D3) 50 MCG (2000 UT) capsule 2,000 Units by Per G Tube route daily    cetirizine (ZYRTEC) 1 MG/ML syrup 10 mg by Per G Tube route at bedtime    oxygen 2 l/m bleed through ventilator at night    Blood Glucose Monitoring Suppl (FIFTY50 GLUCOSE METER 2.0) w/Device KIT by Other route     EPINEPHrine 0.15 mg/0.3 mL auto-injector Inject 0.15 mg into the muscle    lactulose (LACTULOSE) 20 GM/30ML solution 10 g by Per G Tube route 3 times daily Hold dose for loose stools    levETIRAcetam (KEPPRA) 100 MG/ML solution GIVE 15 MLS BY PER G TUBE ROUTE 2 (TWO) TIMES DAILY.    miconazole (MICATIN) 2 % powder 2 times daily as needed     insulin pen needle (FIFTY50 PEN NEEDLES) 31G X 8 MM 1 each     Medications reviewed at today's visit       Objective     OBJECTIVE   PHYSICAL EXAM  This visit was performed during a pandemic event, and the physical exam was limited to my telephone observation of this patient's organ systems and/or body areas.     General: unable to completed. Visit completed over the phone with caregiver.              ASSESSMENT   Today we discussed the assessment and management of the following diagnosis:    ICD-10-CM ICD-9-CM   1. Increased sputum production  R09.3 786.4   2. SOB (shortness of breath)  R06.02 786.05   3. Type 2 diabetes mellitus with hyperglycemia, with long-term current use of insulin  E11.65 250.00    Z79.4 790.29     V58.67   4. TBI (traumatic brain injury)  S06.9X9A 854.00   5. Spastic quadriplegia  G82.50 344.00       PLAN        1. SOB (shortness of breath)  Resolving with increased albuterol/saline nebs with vesting and cough assist/suctioning to 4x/day. .   -chest xray- ultramobile results came back negative   -guaifenesin 49m 4x/day until Friday  -  continue plan through weekend and then return back to original     2. Increased sputum production  Plan as above     3. Type 2 diabetes mellitus with hyperglycemia, with long-term current use of insulin  Hyperglycemia- not much better- Continue to monitor and utilize SS appropriately - will discussed with PCP    4. Spastic quadriplegia  Underlying condition, all treatment decisions made with this in consideration  r    5. TBI (traumatic brain injury)  Underlying condition, all treatment decisions made with this in  consideration      6. Chronic respiratory failure with hypoxia  Underlying condition, all treatment decisions made with this in consideration        Orders Placed This Encounter   No orders placed during this encounter.       Patient Instructions   Fax: [phone]  [name]: attention        Time Based:  I personally spent 20-29 minutes on the calendar day of the encounter, including pre and post visit work reviewing the EMR and management of this patient.       No follow-ups on file.      Barbaraann Boys, NP

## 2020-01-30 NOTE — Telephone Encounter (Signed)
Incoming paperwork for completion to the [facility]  Date Received: 01/30/2020    Type of paperwork (sender:form type): [employer]    Completed paperwork to be sent to:  Fax to [phone]    Special Requests:     Placed in box for: [name]

## 2020-01-30 NOTE — Patient Instructions (Signed)
Fax: [phone]  [name]: attention

## 2020-02-02 ENCOUNTER — Telehealth: Payer: Self-pay

## 2020-02-02 NOTE — Telephone Encounter (Signed)
Incoming paperwork for completion to the [facility]  Date Received: 02/02/2020    Type of paperwork (sender:form type): [employer] for telephone orders    Completed paperwork to be sent to:  Fax to [phone]    Special Requests:     Placed in box for: [name]

## 2020-02-03 ENCOUNTER — Telehealth: Payer: Self-pay | Admitting: Primary Care

## 2020-02-03 ENCOUNTER — Telehealth: Payer: Self-pay

## 2020-02-03 NOTE — Telephone Encounter (Signed)
Incoming paperwork for completion to the [facility]  Date Received: 02/03/2020    Type of paperwork (sender:form type): [facility] Requisition     Completed paperwork to be sent to:  Fax to [phone]    Special Requests:     Placed in box for: [name]

## 2020-02-03 NOTE — Telephone Encounter (Addendum)
Home RN called with multiple complains:  She reports that last week Angel Wells had increased secretions, hyperglycemia. His respiratory symptoms improved with increased albuterol and clerance.    Today he was very happy. After dinner he was hyperglycemic, usually in the 200-300s. Today after dinner he became quiet, he asked if he could be done and complained of a headache though refused tylenol. RN checked his BP because she thought he didn't look right. First set of vitals 169/116 auto 166/100 manual, P 100. Rn reports he "looked like he was trying to whistle" with his breathing.    They put him back to bed 132/68. His O2s are in the 90s. He is now talking and laughing. Per dad and RN, "he's fine."     RN also offers that he just started vimpat. She wonders if this could have been a seizure. I agreed that it may indeed have been a seizure and recommend they reach out to their neurologist.     Will route to [facility] RN to reach out tomorrow to check in. Angel Wells would likely benefit from an appt to address DM/BG management given recent uptick in hyperglycemia.    [name], MD

## 2020-02-04 ENCOUNTER — Encounter: Payer: Self-pay | Admitting: Gastroenterology

## 2020-02-04 ENCOUNTER — Telehealth: Payer: Self-pay | Admitting: Pediatrics

## 2020-02-04 DIAGNOSIS — Z794 Long term (current) use of insulin: Secondary | ICD-10-CM

## 2020-02-04 DIAGNOSIS — E1165 Type 2 diabetes mellitus with hyperglycemia: Secondary | ICD-10-CM

## 2020-02-04 NOTE — Telephone Encounter (Signed)
Paperwork completed 02/04/2020  Sent as outlined in prior documentation

## 2020-02-04 NOTE — Telephone Encounter (Signed)
Spoke to Angel Wells, pt's dad.  Reported "that call was a false alarm."  BP's have been stabile since moving into bed.  Wondering if he was uncomfortable in his chair and falsely elevated his BP.  Doing well this morning.  No further acute concerns.

## 2020-02-04 NOTE — Telephone Encounter (Signed)
[facility] TELEPHONE TRIAGE     Reason for call: Burnett Harry states she is concerned with the patients rising blood sugar over the past four weeks. Shelly reports the patients blood sugar readings have gone well past 220 and higher. Shelly reports the patients "respiratory issues" have been addressed by another provider but the patient still has "an odor" to the patients breath. Shelly requested to speak to a nurse today or Friday afternoon.     Name of caller: Burnett Harry for [name]  Relationship to patient: PDN  Organization: [facility]  Phone:  [phone]

## 2020-02-05 LAB — LEGIONELLA CULTURE: Legionella Culture: 0

## 2020-02-05 NOTE — Telephone Encounter (Signed)
Spoke to [place], Charity fundraiser.  Mostly concerned about BG's.  Upwards of 300 daily.  Feels there was an uptick when first started lamictal (since been d/c'ed).  Stricter diet control.  Not eating pizza, subs or treats as much.  Appetite good, urine seems good.  Secretions better.    Odor in mouth is terrible.  Brushes teeth often throughout the day.  No noted thrush or abscess.  Wondering about an insulin pump.    Will route to med staff to set up a dental appt.  Will plan to schedule PCP visit to discuss BG's.

## 2020-02-06 ENCOUNTER — Telehealth: Payer: Self-pay | Admitting: Neurology

## 2020-02-06 ENCOUNTER — Telehealth: Payer: Self-pay | Admitting: Pediatrics

## 2020-02-06 NOTE — Telephone Encounter (Signed)
I signed the order.  The physician name at the bottom is Dr. Aida Puffer, but I don't find any documentation that Dr. Aida Puffer spoke to them and provided a verbal order.  This order was dated 12/19/2019 and I don't find any documentation that corresponds to that.  Please send the order back.  Thank you.

## 2020-02-06 NOTE — Telephone Encounter (Signed)
Incoming paperwork for completion to the [facility]  Date Received: 02/06/2020    Type of paperwork (sender:form type): revision to plan care    Completed paperwork to be sent to:  Call for pick up [phone]    Special Requests:     Placed in box for: [name]

## 2020-02-06 NOTE — Telephone Encounter (Signed)
Received order from [facility] to be signed. Placed in Lindsay's mailbox.

## 2020-02-06 NOTE — Telephone Encounter (Signed)
Writer called RN to get patient scheduled for dental. RN states patient was supposed to have a buddy appointment and would like to a call back to discuss clarification on patient's plan of care.  Phone: [phone]

## 2020-02-06 NOTE — Telephone Encounter (Signed)
Form faxed back to [phone] and sent to scanning.

## 2020-02-09 ENCOUNTER — Ambulatory Visit: Payer: Medicare Other

## 2020-02-09 ENCOUNTER — Encounter: Payer: Self-pay | Admitting: Gastroenterology

## 2020-02-10 NOTE — Telephone Encounter (Signed)
RN Burnett Harry is requesting a call back from nursing.  She reports she is a bit concern patient's blood sugar is over 300.     Phone: [phone]

## 2020-02-11 MED ORDER — INSULIN DETEMIR 100 UNIT/ML SC SOPN *I*
90.0000 [IU] | PEN_INJECTOR | Freq: Every evening | SUBCUTANEOUS | 5 refills | Status: DC
Start: 2020-02-11 — End: 2020-02-19

## 2020-02-11 NOTE — Telephone Encounter (Signed)
Angel Wells is looking to speak with RN or PCP.  Patient's father will not be home tomorrow and Angel Wells has the day off to do this appointment. Angel Wells has requested a call back.    Phone: [phone]

## 2020-02-11 NOTE — Addendum Note (Signed)
Addended by: Midge Minium on: 02/11/2020 04:46 PM     Modules accepted: Orders

## 2020-02-11 NOTE — Telephone Encounter (Signed)
Paperwork completed 02/11/2020  Sent as outlined in prior documentation

## 2020-02-11 NOTE — Telephone Encounter (Signed)
Called Shelly back, fastings are all above 200s  Premeal sugars are all above 200 (through the whole month of July)   Diet has not changed, now off lamotrigine(can raise BGs)  There is a foul odor to his mouth and they would like dental appt made.  Will increase detemir to 90 units at night.  Appt made for Dr [name] on 8/12 at 1:30pm by video, writer will email zoom link to [place].    Writer faxed over new rx for detemir to [employer]' fax.

## 2020-02-11 NOTE — Telephone Encounter (Signed)
Spoke with Shelly  Respiratory concerns pt was having (over the past 2 weeks) have all resolved    He continues to have high sugars (well into the 300's, Shelly will send logs for the past 2 months to [facility] email)  Using the 5 units baseline before every meal and the sliding scale that is documented in his chart (confirmed it matches the scale on medlist)    Shelly wondering about labs/throat swab/dental appointment  Ongoing concerns for pts breath being foul    Booked for a video appointment tomorrow with MD  Burnett Harry would like to zoom in and asking for zoom to be sent to:  [email or web address]

## 2020-02-12 ENCOUNTER — Ambulatory Visit: Payer: Medicare Other | Admitting: Pediatrics

## 2020-02-12 ENCOUNTER — Telehealth: Payer: Self-pay | Admitting: Pediatrics

## 2020-02-12 NOTE — Telephone Encounter (Signed)
[facility] TELEPHONE TRIAGE     Reason for call: [name], clinical director called reporting that she was unaware that patient's appointment was changed. Writer relayed that pcp spoke with [place].  [name] states that she is concerned about patient's ear. She reports that his right ear has bloody drainage and very thick respiratory secretions. [name] is requesting a call back.       Name of caller:[name] for [name]  Relationship to patient: clinical director  Phone:  [phone]

## 2020-02-12 NOTE — Telephone Encounter (Signed)
Incoming paperwork for completion to the [facility]  Date Received: 02/12/2020    Type of paperwork (sender:form type): [employer]     Completed paperwork to be sent to:  Fax to [phone]    Special Requests:     Placed in box for: [name]

## 2020-02-13 ENCOUNTER — Telehealth: Payer: Self-pay

## 2020-02-13 ENCOUNTER — Other Ambulatory Visit: Payer: Self-pay | Admitting: Pediatrics

## 2020-02-13 DIAGNOSIS — K219 Gastro-esophageal reflux disease without esophagitis: Secondary | ICD-10-CM

## 2020-02-13 MED ORDER — AMOXICILLIN-POT CLAVULANATE 875-125 MG PO TABS *I*
1.0000 | ORAL_TABLET | Freq: Two times a day (BID) | ORAL | 0 refills | Status: DC
Start: 2020-02-13 — End: 2020-02-19

## 2020-02-13 NOTE — Telephone Encounter (Signed)
Incoming paperwork for completion to the [facility]  Date Received: 02/13/2020    Type of paperwork (sender:form type): cpap-filter foan reusable     Completed paperwork to be sent to:  Fax to [phone]    Special Requests:     Placed in box for: [name]

## 2020-02-13 NOTE — Telephone Encounter (Signed)
Spoke to [place].  Levemir to 90u at bedtime.  Fasting BG: 317.  Faxed to: [phone] with attn: [name].  80u levemir was given last night.  Reported never receiving fax.    Noted right ear bloody ear drainage.  Denied sputum changes today.    Burnett Harry will continue to try and clean and examine ear.  Will call Shelly back after lunch.    Per PCP, e-faxed script to [facility] as well.

## 2020-02-13 NOTE — Telephone Encounter (Signed)
Incoming paperwork for completion to the [facility]  Date Received: 02/13/2020    Type of paperwork (sender:form type): [employer]     Completed paperwork to be sent to:  Fax to [phone]    Special Requests:     Placed in box for: [name]

## 2020-02-13 NOTE — Telephone Encounter (Addendum)
Spoke to [place], Charity fundraiser.  Lots of dark black drainage form right ear.  Not ear wax.  Mostly sleeps on this side.  Pt c/o discomfort when she was cleaning it.  Feels its attributing to his high BG's recently.  Not having any concerning respiratory symptoms or other issues.    Wondering if appt is needed or if abx can just be prescribed.  Will route to PCP for further guidance.

## 2020-02-13 NOTE — Telephone Encounter (Signed)
Shelly called back requesting to speak with Irving Burton, RN.

## 2020-02-13 NOTE — Telephone Encounter (Signed)
Burnett Harry is calling to speak to RN.  Writer routed caller.

## 2020-02-13 NOTE — Telephone Encounter (Signed)
RN Burnett Harry is calling to update PCP. There is definitely blood in patients ear, she put peroxide on a q-tip, pulled ear back to get q-tip and patient showed discomfort. Burnett Harry states she was with patient Tuesday and his ear was not like this. Burnett Harry believes patient could have an infection which could cause his sugar levels to be high. Burnett Harry is inquiring if PCP could send in an antibiotic or if she would like to see patient. Burnett Harry states they are able to do a video appointment if needed. Burnett Harry has requested a call back.    Phone: [phone]

## 2020-02-13 NOTE — Telephone Encounter (Signed)
Spoke to [place].  Switched appt on 8/12 from video to in-person.  Aware of abx.  Burnett Harry will call Monday to set-up dental appt.  Will call if symptoms worsen over weekend.

## 2020-02-13 NOTE — Addendum Note (Signed)
Addended by: Midge Minium on: 02/13/2020 04:05 PM     Modules accepted: Orders

## 2020-02-13 NOTE — Telephone Encounter (Signed)
If we can get him in person early next week that would be ideal, I can send in augmentin but I honestly am worried about his teeth too so i'm not sure if it is all the teeth.

## 2020-02-16 ENCOUNTER — Telehealth: Payer: Self-pay | Admitting: Pediatrics

## 2020-02-16 ENCOUNTER — Telehealth: Payer: Self-pay | Admitting: Neurology

## 2020-02-16 ENCOUNTER — Encounter: Payer: Self-pay | Admitting: Gastroenterology

## 2020-02-16 DIAGNOSIS — R093 Abnormal sputum: Secondary | ICD-10-CM

## 2020-02-16 DIAGNOSIS — G40219 Localization-related (focal) (partial) symptomatic epilepsy and epileptic syndromes with complex partial seizures, intractable, without status epilepticus: Secondary | ICD-10-CM

## 2020-02-16 MED ORDER — LACOSAMIDE 10 MG/ML PO SOLN *I*
ORAL | 5 refills | Status: DC
Start: 2020-02-16 — End: 2020-03-02

## 2020-02-16 NOTE — Telephone Encounter (Signed)
Incoming paperwork for completion to the [facility]  Date Received: 02/16/2020    Type of paperwork (sender:form type): Revision to plan of care    Completed paperwork to be sent to:  Fax to [phone]    Special Requests:     Placed in box for: [name]

## 2020-02-16 NOTE — Telephone Encounter (Signed)
[facility]  After Hours Phone Note      History   [name], the patient's authorized caregiver of Angel Wells January 04, 1991 called reporting inability to keep     Gave saline neb, mucinex, gave vest treatment, cough assist, suctioned him   Sats are barely 92% on 2L on the ventilator   He drops to 85% just now (he is asleep)   She is unsure if his nurses are vaccinated   Dad is back home (was in er yesterday after cutting his finger), he does not want him on augmentin and is convinced that he has an amoxicillin allergy because Dad himself has one    Assessment     28yo M trach dependent, on ventilator at night now with increased sputum production, afebrile, nursing is worried about infection, and asking for an antibiotic     Plan      [name] is asking for an antibiotic, she is concerned that he gets very drowsy when he gets vimpat, she is wondering if this should only be given while he is on the vent  She wonders if she should call neuro on this  I informed her that he has augmentin at home if they'd like to restart the antibiotics, she states Dad will not let him take that.  I suspect this may be related to an aspiration event, so recommend increasing his chest PT to QID along with saline nebs, they can submit a trach aspirate and we will evaluate on Thursday at in person appt.   If he becomes febrile, they can call back and we can consider restarting an antibiotic. Expressed my concern that Dad is becoming obstructive with his care and I am not comfortable with the nursing team first calling to stop the antibiotic (yesterday) and then calling back to ask for a different one.  SHe also mentions that he still has blood coming out of his ear (which is what the first antibiotic was started for).    [name], the patient's authorized caregiver expressed understanding of plan and will call for further concerns     Angel Wells was instructed to call back if symptoms worsen, change or progress.      Midge Minium,  MD  02/16/2020  12:52 PM

## 2020-02-16 NOTE — Telephone Encounter (Signed)
I spoke with [name].  There has been a significant reduction in Angel Wells seizure frequency while taking lacosamide 50 mg twice daily.  She was with him today and gave him his morning dose of 100 mg and soon after he became sedated, his respiratory function diminished, and his O2 sats dropped. He had to go back on the vent.  He was that way for a few hours.  She thinks yesterday was his first day of being on the 100 mg doses, but she does not know if there were any problems.  I recommended that we just cut back down to 50 mg twice daily rather than 75 mg twice daily since the lower of those doses seemed to be very beneficial.  I updated his prescription.

## 2020-02-16 NOTE — Telephone Encounter (Signed)
Clinical director Dellia Nims is asking to speak to MD in regards to the Vimpat. She is asking if the medication can be decreased to 75mg  or lower.    Please call at [phone] or if its after 5pm call her cell [phone]

## 2020-02-17 ENCOUNTER — Telehealth: Payer: Self-pay | Admitting: Pediatrics

## 2020-02-17 ENCOUNTER — Encounter: Payer: Self-pay | Admitting: Gastroenterology

## 2020-02-17 ENCOUNTER — Telehealth: Payer: Self-pay | Admitting: Neurology

## 2020-02-17 NOTE — Telephone Encounter (Signed)
LVM for Angel Wells to discuss symptoms and pt assessment.  Call back number provided.

## 2020-02-17 NOTE — Telephone Encounter (Signed)
Received order from [employer] to be signed. Placed in Lindsay's mailbox.

## 2020-02-17 NOTE — Telephone Encounter (Signed)
Paperwork completed 02/17/2020  Sent as outlined in prior documentation

## 2020-02-17 NOTE — Telephone Encounter (Signed)
I reviewed and signed the order, and it can be returned.  Thanks.

## 2020-02-17 NOTE — Telephone Encounter (Signed)
[facility] TELEPHONE TRIAGE     Reason for call: [name], PDN called reporting that patient's secretions are thick and yellow and he is currently using his ventilator. [name] is requesting an order for a covid swab and supplies for a sputum trap. [name] is requesting a call back today.    Name of caller:[name] for Angel Wells  Relationship to patient: pdn   Phone:  [phone]

## 2020-02-17 NOTE — Telephone Encounter (Signed)
Incoming paperwork for completion to the [facility]  Date Received: 02/17/2020    Type of paperwork (sender:form type): Revision to plan car    Completed paperwork to be sent to:  Fax to [phone]    Special Requests:     Placed in box for: [name]

## 2020-02-17 NOTE — Telephone Encounter (Signed)
LVM for Angel Wells to discuss symptoms.  Call back number provided.

## 2020-02-17 NOTE — Telephone Encounter (Signed)
[name] called back returning RN call and is requesting a call back.   Phone:  [phone]

## 2020-02-17 NOTE — Telephone Encounter (Signed)
Faxed to [phone] and sent to scanning

## 2020-02-18 ENCOUNTER — Encounter: Payer: Self-pay | Admitting: Gastroenterology

## 2020-02-18 ENCOUNTER — Other Ambulatory Visit
Admission: RE | Admit: 2020-02-18 | Discharge: 2020-02-18 | Disposition: A | Discharge status: Home / Self Care | Payer: Medicare Other | Source: Ambulatory Visit | Attending: Pediatrics | Admitting: Pediatrics

## 2020-02-18 DIAGNOSIS — G40909 Epilepsy, unspecified, not intractable, without status epilepticus: Secondary | ICD-10-CM | POA: Insufficient documentation

## 2020-02-18 LAB — GRAM STAIN: Gram Stain: 0

## 2020-02-18 LAB — AEROBIC CULTURE

## 2020-02-18 NOTE — Telephone Encounter (Signed)
Paperwork completed 02/18/2020  Sent as outlined in prior documentation

## 2020-02-18 NOTE — Telephone Encounter (Signed)
Confirmed patient is on [facility] dental schedule for 2:30PM on 8/12

## 2020-02-18 NOTE — Telephone Encounter (Signed)
Spoke to [employer], Charity fundraiser.  At house currently.  Still on vent, sleeping restfully.  O2 93% on 1L, temp 97.2 axillary.  Sputum still slightly yellow and thick.  Night RN's noted rhonchi and expiratory wheezing on documentation.  Angel Wells has not needed to deep sxn pt yet this morning though.  Cupping, vesting and albuterol treatments 4xday.  Diarrhea for the last 3 days (when on abx).  No longer on abx.  Burn on right side groin.  Using desitin on it.    No acute concerns at this point.  Seems restful and comfortable.  Will call if symptoms change.  Continue with airway clearance.  Aware of in-person appt with PCP tomorrow.  Will route to [facility] to confirm dental appt.

## 2020-02-18 NOTE — Telephone Encounter (Addendum)
Will complete and continue documentation in separate encounter.

## 2020-02-18 NOTE — Telephone Encounter (Signed)
Faxed to [employer].  F: [phone]   Faxed to above number

## 2020-02-18 NOTE — Telephone Encounter (Signed)
[name] RN called reporting that the sputum culture order has not been put in yet by [facility]. Writer relayed information to Charity fundraiser. Angel Wells is also inquiring a patient should have a covid swab and would like a call back regarding this.   YIFOY:[id number]

## 2020-02-19 ENCOUNTER — Encounter: Payer: Self-pay | Admitting: Pediatrics

## 2020-02-19 ENCOUNTER — Ambulatory Visit: Payer: Medicare Other | Admitting: Pediatrics

## 2020-02-19 ENCOUNTER — Ambulatory Visit: Payer: Medicare Other | Attending: Pediatrics | Admitting: Pediatrics

## 2020-02-19 VITALS — BP 136/84 | HR 83 | Temp 97.5°F | Ht 65.0 in | Wt 176.4 lb

## 2020-02-19 DIAGNOSIS — J4540 Moderate persistent asthma, uncomplicated: Secondary | ICD-10-CM | POA: Insufficient documentation

## 2020-02-19 DIAGNOSIS — S069X1D Unspecified intracranial injury with loss of consciousness of 30 minutes or less, subsequent encounter: Secondary | ICD-10-CM | POA: Insufficient documentation

## 2020-02-19 DIAGNOSIS — Z794 Long term (current) use of insulin: Secondary | ICD-10-CM | POA: Insufficient documentation

## 2020-02-19 DIAGNOSIS — J9611 Chronic respiratory failure with hypoxia: Secondary | ICD-10-CM | POA: Insufficient documentation

## 2020-02-19 DIAGNOSIS — E1165 Type 2 diabetes mellitus with hyperglycemia: Secondary | ICD-10-CM | POA: Insufficient documentation

## 2020-02-19 MED ORDER — NOVOLOG FLEXPEN 100 UNIT/ML SC SOPN
PEN_INJECTOR | SUBCUTANEOUS | 5 refills | Status: DC
Start: 2020-02-19 — End: 2020-03-04

## 2020-02-19 MED ORDER — INSULIN GLARGINE (2 UNIT DIAL) 300 UNIT/ML SC SOPN *I*
90.0000 [IU] | PEN_INJECTOR | Freq: Every evening | SUBCUTANEOUS | 0 refills | Status: DC
Start: 2020-02-19 — End: 2020-03-18

## 2020-02-19 NOTE — Patient Instructions (Addendum)
Increase nutritional baseline of 7 +SS as written    Change from detemir (levemir) to insulin glargine 90 units qhs.    Continue increased vesting four times a day with albuterol, ipratropium and saline until 8/15 am.  Go back to BID vesting on 8/15 with BID albuterol, ipratropium and saline.    He does not need antibiotics now.     Follow up in 3 weeks

## 2020-02-19 NOTE — Progress Notes (Signed)
[place]    Office Visit Note     REASON FOR VISIT      Chief Complaint   Patient presents with    Follow-up     ear infection         PRIMARY DIAGNOSIS      TBI    Subjective     SUBJECTIVE      Angel Wells was accompanied by caregiver who provided all of subjective history     Typical nurses are Darrick Penna and Ilean China is training a new nurse  DM2 - uncontrolled has been on higher dose of detemir 90units  Lowest: 206-317, fasting 227  BL 5 units +SS,     Vent dependent at night   Shelly reports that [employer] doesn't like it when someone new is there so sometimes will act out.    She feels like when [name] had called the other day, she had been training a new nurse.  No issues last night with vent, 92-94%  Normally on RA during day.  She does not feel that he is having further issues with his breathing as far as needing more oxygen or vent support but she has noticed him wheezing a bit more      Ear drainage out of R ear - subsided, thinks perhaps her concern with the mouth/dental was really due to his ear.  THey will see dental after today's visit  No fevers, other VS have been normal    ROS  Review of Systems as per HPI above    Past Medical History, Social History, Family History, and Medications/allergies reviewed during this visit    Current Outpatient Medications   Medication    insulin aspart (NOVOLOG FLEXPEN) 100 UNIT/ML injection pen    famotidine (PEPCID) 20 MG tablet    lacosamide (VIMPAT) 10 mg/mL SOLN oral solution    ACCU-CHEK AVIVA PLUS test strip    senna (SENOKOT) 8.6 MG tablet    generic DME    docusate sodium (COLACE) 10 mg/mL liquid    sennosides (SENOKOT) 8.8 mg/60m syrup    ipratropium (ATROVENT) 0.02 % nebulizer solution    albuterol (PROVENTIL) (2.5 mg/313m 0.083% nebulizer solution    B-D UF III MINI PEN NEEDLES 31G X 5 MM    baclofen (LIORESAL) 10 MG tablet    triamcinolone (KENALOG) 0.025 % cream    valproate (VALPROIC ACID) 50 mg/mL syrup    MIC-KEY  gastrostomy feeding tube extension set 24"    Syringe Disposable 60 ML MISC    MIC-KEY gastrostomy kit 14FR    gauze pads 2"X2"    generic DME    budesonide (PULMICORT) 0.25 MG/2ML nebulizer solution    Nebulizers (PARI LC PLUS VIOS PRO NEB) MISC    fluticasone (FLONASE) 50 MCG/ACT nasal spray    tobramycin, PF, (TOBI) 300 MG/5ML nebulizer solution    generic DME    generic DME    nebulizer device with mask and tubing    generic DME    generic DME    Accu-Chek FastClix Lancets MISC    generic DME    lactobacillus rhamnosus, GG, (CULTURELLE) capsule    Gauze Pads & Dressings (SPLIT GAUZE DRAINAGE SPONGE) 4"X4"    generic DME    Nebulizer MISC    generic DME    generic DME    zinc oxide (DESITIN) 40 % paste    generic DME    sodium chloride 0.9 % nebulizer solution    gauze sponge (  CURITY) 4"X4" pads    generic DME    Adhesive Tape 1"x5yd TAPE    generic DME    generic DME    surgical lubricant (SURGILUBE) gel    Distilled Water LIQD    generic DME    generic DME    generic DME    generic DME    generic DME    generic DME    generic DME    generic DME    FIBER SELECT GUMMIES PO    Multiple Vitamins-Minerals (HM MULTIVITAMIN ADULT GUMMY PO)    Ascorbic Acid (VITAMIN C ADULT GUMMIES PO)    Cholecalciferol (VITAMIN D3) 50 MCG (2000 UT) capsule    cetirizine (ZYRTEC) 1 MG/ML syrup    oxygen    Blood Glucose Monitoring Suppl (FIFTY50 GLUCOSE METER 2.0) w/Device KIT    lactulose (LACTULOSE) 20 GM/30ML solution    levETIRAcetam (KEPPRA) 100 MG/ML solution    insulin pen needle (FIFTY50 PEN NEEDLES) 31G X 8 MM    Insulin Glargine, 2 Unit Dial, 300 UNIT/ML injection pen    ibuprofen (ADVIL,MOTRIN) 600 MG tablet    bisacodyl (DULCOLAX) 10 MG suppository    clonazePAM (KLONOPIN) 1 MG tablet    diphenhydrAMINE (BENADRYL) 25 MG oral solid    EPINEPHrine 0.15 mg/0.3 mL auto-injector    miconazole (MICATIN) 2 % powder     No current facility-administered medications for this  visit.          Objective     OBJECTIVE      BP 136/84    Pulse 83    Temp 36.4 C (97.5 F)    Ht 1.651 m (_0 )    Wt 80 kg (176 lb 6.4 oz)    SpO2 93%    BMI 29.35 kg/m     Physical Exam  Constitutional:       Appearance: He is normal weight.      Comments: Examined in wheelchair, patient cooperative with exam, RN reminds him to interact with her and Probation officer (seems to be having conversations with himself)   HENT:      Head: Normocephalic.      Left Ear: External ear normal.      Ears:      Comments: R ear with whitish drainage in the ear canal, could not visualize perforation, TM otherwise clear     Mouth/Throat:      Mouth: Mucous membranes are moist.   Cardiovascular:      Rate and Rhythm: Normal rate and regular rhythm.      Pulses: Normal pulses.   Pulmonary:      Effort: Pulmonary effort is normal.      Breath sounds: Wheezing present.      Comments: Bilateral wheeze  Abdominal:      General: Abdomen is flat.      Comments: gtube site c/d/i   Skin:     Capillary Refill: Capillary refill takes less than 2 seconds.   Neurological:      Mental Status: He is alert. Mental status is at baseline.      Comments: Can respond to questions but most of history obtained from PDN              ASSESSMENT / DIAGNOSIS         1. Type 2 diabetes mellitus with hyperglycemia, with long-term current use of insulin  BGs still not well controlled, will increase nutritional baseline and change detemir to glargine 90 units qhs, may need  to increase even further.    - insulin aspart (NOVOLOG FLEXPEN) 100 UNIT/ML injection pen; Inject SQ before meals: Nutritional BL: 7 units + SS as follows: <70: hold BL, 70-200: BL, > 200: BL+2, >250: BL+4, >300: BL+6, >350: BL +8. Alert MD for BG <70 and >350.  Dispense: 3 each; Refill: 5    2. Traumatic brain injury, with loss of consciousness of 30 minutes or less, subsequent encounter  underlying condition, all treatment decisions made with this in consideration. He gets cared for by PDN [name]  is out on leave currently, Shelly, and a new nurse is being trained, [name] is the supervisor)    3. Chronic respiratory failure with hypoxia  Back to normal settings of RA during day and vent dependent at night    4. Moderate persistent asthma, unspecified whether complicated  Lung exam improved after A+A neb in office, would continue increased vesting until Sunday AM and then go back to BID vesting.  Also need to increase ipratropium to four times a day while sick.  Hold off on antibiotics for now.       Orders Placed This Encounter    Insulin Glargine, 2 Unit Dial, 300 UNIT/ML injection pen    insulin aspart (NOVOLOG FLEXPEN) 100 UNIT/ML injection pen     Patient Instructions   Increase nutritional baseline of 7 +SS as written    Change from detemir (levemir) to insulin glargine 90 units qhs.    Continue increased vesting four times a day with albuterol, ipratropium and saline until 8/15 am.  Go back to BID vesting on 8/15 with BID albuterol, ipratropium and saline.    He does not need antibiotics now.     Follow up in 3 weeks         --Patient instructed to call if symptoms are not improving or worsening  --Follow-up arranged  Return in about 2 weeks (around 03/04/2020) for Dr Shanda Howells as booked.         Electronically signed by Doretha Imus, MD , 02/23/2020 @   [place], Phone: [phone]

## 2020-02-19 NOTE — Progress Notes (Signed)
Patient is here accompanied by staff nurse Burnett Harry. Patient weighed on bed at visit-176.4 lbs. Patient with some congestion noted. O2 sat 93%, pt then suctioned for small amount of white sputum. O2 sat after suctioning-95%. Temp 97.5. Vance Gather reports patient had the ear infection to left ear and patient is here for MD to assess the ear.       Discussed the above with Dr Hosie Spangle, RN

## 2020-02-20 ENCOUNTER — Telehealth: Payer: Self-pay | Admitting: Pediatrics

## 2020-02-20 ENCOUNTER — Encounter: Payer: Self-pay | Admitting: Gastroenterology

## 2020-02-20 NOTE — Telephone Encounter (Signed)
[facility] TELEPHONE TRIAGE     Reason for call: Burnett Harry, RN called requesting to speak with nursing. She states that she needs clarification on the orders PCP gave yesterday after his appointment. Burnett Harry is requesting a call back.    Name of caller:Shelly for Azell Bill  Relationship to patient: RN  Phone:  [phone]

## 2020-02-20 NOTE — Telephone Encounter (Signed)
Burnett Harry is calling back regarding this and is requesting a call back.   Phone:  [phone]

## 2020-02-23 ENCOUNTER — Telehealth: Payer: Self-pay | Admitting: Pediatrics

## 2020-02-23 NOTE — Telephone Encounter (Signed)
Incoming paperwork for [facility]  Date Received: 02/23/2020    Type of paperwork (sender:form type): revision to plan of care    Completed paperwork to be sent to:  Fax to [phone]    Special Requests:     Placed in box for: [name]

## 2020-02-24 ENCOUNTER — Encounter: Payer: Self-pay | Admitting: Pediatrics

## 2020-02-24 NOTE — Telephone Encounter (Signed)
[facility]  Prior Authorization Request    Date of request: 02/24/2020  Time of call: 3:41 PM    Medication Requiring PA: Insulin Glargine, 2 Unit Dial, 300 UNIT/ML injection pen   Directions(dosing and frequency): Inject 90 units into the skin nightly Discard each pen 56 days after first use. - Subcutaneous  Quantity/ Day Supply: 9 ML/30  Dx code: E11.65, Z79.4    Request received from:[facility] PA Source: Pharmacy  Name and Phone Number of Pharmacy[phone]   [phone]    Additional Pharmacy Benefit plan: no  Insurance coverage confirmed: yes  Covered Alternative Medication: N/A    Tax ID: [id number]  Provider and NPI: Lie- [id number]  Please provide justification for PA submission & urgency or indicate which covered alternative was ordered

## 2020-02-25 NOTE — Telephone Encounter (Signed)
Basaglar would be fine, but the concentration of 100unit/mL would have him injecting almost 21mL of insulin subcutaneously per night which would not be acceptable (pain, absorption).  Are there other concentrations available?

## 2020-02-25 NOTE — Telephone Encounter (Signed)
Insurance states they do not cover Insulin Glargine 300 UNIT. They state they do cover brand Basaglar kwikpen subcutaneous solution pen injector 100 unit / ml. The insurance is asking if Angel Wells would be beneficial for the patient ?   Please advise , thank you

## 2020-02-25 NOTE — Telephone Encounter (Signed)
Insulin Glargine, 2 Unit Dial, 300 UNIT/ML injection  has been sent for a pa

## 2020-02-26 ENCOUNTER — Other Ambulatory Visit: Payer: Self-pay | Admitting: Pediatrics

## 2020-02-26 NOTE — Telephone Encounter (Signed)
8/20 PM  8/23 PM  8/25 PM

## 2020-02-26 NOTE — Telephone Encounter (Signed)
Can we appeal or pursue PA for these reasons of administration difficulties?  Thanks  Angel Wells

## 2020-02-26 NOTE — Telephone Encounter (Signed)
The insurance will want 3 dates and time that you are available along with a good contact number. Once I have that info I can get the peer to peer set up, thank you

## 2020-02-26 NOTE — Telephone Encounter (Signed)
When you say "administration difficulties" , do you mean administration difficulties for the patient ? We can set up a peer to peer or if you want to write a LOMN we can send in for an appeal . Peer to peer might get you an answer quicker than an appeal . Please advise as to what you would like to do, thank you

## 2020-02-26 NOTE — Telephone Encounter (Signed)
I received a fax over night stating [employer]x Solostar soln pen inj 300 unit / ml soln pen inj has denied/ non formulary . Besides [employer] , the other alternatives provided are [place] and [employer]

## 2020-02-27 LAB — LEGIONELLA CULTURE: Legionella Culture: 0

## 2020-02-27 MED ORDER — FLUTICASONE PROPIONATE 50 MCG/ACT NA SUSP *I*
1.0000 | Freq: Every day | NASAL | 1 refills | Status: DC
Start: 2020-02-27 — End: 2020-04-18

## 2020-02-27 NOTE — Telephone Encounter (Signed)
Completed peer to peer  Approved for reasons stated in previous notes  We will receive notification within 24h I am told    [name], MD

## 2020-02-27 NOTE — Telephone Encounter (Signed)
I need a phone number please , thank you

## 2020-02-27 NOTE — Telephone Encounter (Signed)
Peer to peer has been set up. They will call the number you provided on one of these dates 8/20, 8/23, 8/25 between 1-3 , thank you

## 2020-02-28 ENCOUNTER — Encounter: Payer: Self-pay | Admitting: Neurology

## 2020-02-28 DIAGNOSIS — G40219 Localization-related (focal) (partial) symptomatic epilepsy and epileptic syndromes with complex partial seizures, intractable, without status epilepticus: Secondary | ICD-10-CM

## 2020-03-01 NOTE — Telephone Encounter (Signed)
Received the approval letter and updated flow sheet

## 2020-03-01 NOTE — Telephone Encounter (Signed)
Father notified that insulin has been approved.

## 2020-03-02 ENCOUNTER — Telehealth: Payer: Self-pay

## 2020-03-02 ENCOUNTER — Encounter: Payer: Self-pay | Admitting: Gastroenterology

## 2020-03-02 MED ORDER — LACOSAMIDE 10 MG/ML PO SOLN *I*
ORAL | 5 refills | Status: DC
Start: 2020-03-02 — End: 2020-05-13

## 2020-03-02 NOTE — Telephone Encounter (Signed)
Incoming paperwork for [facility] to the [facility]  Date Received: 03/02/2020    Type of paperwork (sender:form type): nebulizaer    Completed paperwork to be sent to:  Fax to [phone]    Special Requests:     Placed in box for: [name]

## 2020-03-04 ENCOUNTER — Telehealth: Payer: Self-pay | Admitting: Neurology

## 2020-03-04 ENCOUNTER — Ambulatory Visit: Payer: Medicare Other | Attending: Pediatrics | Admitting: Pediatrics

## 2020-03-04 VITALS — BP 150/80 | HR 78

## 2020-03-04 DIAGNOSIS — S069X9A Unspecified intracranial injury with loss of consciousness of unspecified duration, initial encounter: Secondary | ICD-10-CM | POA: Insufficient documentation

## 2020-03-04 DIAGNOSIS — J9611 Chronic respiratory failure with hypoxia: Secondary | ICD-10-CM | POA: Insufficient documentation

## 2020-03-04 DIAGNOSIS — E1165 Type 2 diabetes mellitus with hyperglycemia: Secondary | ICD-10-CM

## 2020-03-04 DIAGNOSIS — S069XAA Unspecified intracranial injury with loss of consciousness status unknown, initial encounter: Secondary | ICD-10-CM

## 2020-03-04 DIAGNOSIS — G8250 Quadriplegia, unspecified: Secondary | ICD-10-CM | POA: Insufficient documentation

## 2020-03-04 DIAGNOSIS — Z9911 Dependence on respirator [ventilator] status: Secondary | ICD-10-CM | POA: Insufficient documentation

## 2020-03-04 DIAGNOSIS — Z794 Long term (current) use of insulin: Secondary | ICD-10-CM | POA: Insufficient documentation

## 2020-03-04 MED ORDER — NOVOLOG FLEXPEN 100 UNIT/ML SC SOPN
PEN_INJECTOR | SUBCUTANEOUS | 5 refills | Status: DC
Start: 2020-03-04 — End: 2020-05-13

## 2020-03-04 NOTE — Telephone Encounter (Signed)
Received orders to be signed from Nurse Core. Placed in Lindsay's mailbox.

## 2020-03-04 NOTE — Patient Instructions (Signed)
Increase nutritional baseline to 10 units plus the sliding scale

## 2020-03-04 NOTE — Telephone Encounter (Signed)
Paperwork completed 03/04/2020  Sent as outlined in prior documentation

## 2020-03-04 NOTE — Progress Notes (Signed)
[facility]    Telemedicine Note     REASON FOR VISIT      Chief Complaint   Patient presents with    Follow-up    Diabetes    Seizures         PRIMARY DIAGNOSIS      TBI    Subjective     SUBJECTIVE      Wilkins was accompanied by caregiver who provided all of subjective history   No fevers, trach secretion a little more yellow    Typical nurses are Darrick Penna and Ilean China is training a new nurse  DM2 - uncontrolled  Lowest: 206-317, fasting 227  BL 7 units +SS,   AM - 198-279, just started glargine last night  Prelunch - 198-309  [place]upper - above 200    No fevers, trach secretion a little more yellow but otherwise tolerating his vesting, no desats during the day (normally desats after coming off the vent)    Vent dependent at night   Diet: yogurt, half banana, cereal with milk  Dinner - barbecued ribs, sweet potato,   Melon and cheese    ROS  Review of Systems as per HPI above    Past Medical History, Social History, Family History, and Medications/allergies reviewed during this visit    Current Outpatient Medications   Medication    B-D UF III MINI PEN NEEDLES 31G X 5 MM    triamcinolone (KENALOG) 0.025 % cream    budesonide (PULMICORT) 0.25 MG/2ML nebulizer solution    Insulin Glargine, 2 Unit Dial, 300 UNIT/ML injection pen    insulin aspart (NOVOLOG FLEXPEN) 100 UNIT/ML injection pen    lacosamide (VIMPAT) 10 mg/mL SOLN oral solution    fluticasone (FLONASE) 50 MCG/ACT nasal spray    famotidine (PEPCID) 20 MG tablet    ACCU-CHEK AVIVA PLUS test strip    senna (SENOKOT) 8.6 MG tablet    generic DME    docusate sodium (COLACE) 10 mg/mL liquid    sennosides (SENOKOT) 8.8 mg/7m syrup    ipratropium (ATROVENT) 0.02 % nebulizer solution    albuterol (PROVENTIL) (2.5 mg/364m 0.083% nebulizer solution    ibuprofen (ADVIL,MOTRIN) 600 MG tablet    baclofen (LIORESAL) 10 MG tablet    valproate (VALPROIC ACID) 50 mg/mL syrup    MIC-KEY gastrostomy feeding tube extension set 24"     Syringe Disposable 60 ML MISC    MIC-KEY gastrostomy kit 14FR    gauze pads 2"X2"    generic DME    Nebulizers (PARI LC PLUS VIOS PRO NEB) MISC    tobramycin, PF, (TOBI) 300 MG/5ML nebulizer solution    generic DME    generic DME    nebulizer device with mask and tubing    generic DME    generic DME    Accu-Chek FastClix Lancets MISC    generic DME    lactobacillus rhamnosus, GG, (CULTURELLE) capsule    Gauze Pads & Dressings (SPLIT GAUZE DRAINAGE SPONGE) 4"X4"    generic DME    Nebulizer MISC    generic DME    bisacodyl (DULCOLAX) 10 MG suppository    clonazePAM (KLONOPIN) 1 MG tablet    generic DME    zinc oxide (DESITIN) 40 % paste    generic DME    sodium chloride 0.9 % nebulizer solution    gauze sponge (CURITY) 4"X4" pads    generic DME    Adhesive Tape 1"x5yd TAPE    generic DME  generic DME    surgical lubricant (SURGILUBE) gel    Distilled Water LIQD    generic DME    generic DME    generic DME    generic DME    generic DME    generic DME    generic DME    generic DME    diphenhydrAMINE (BENADRYL) 25 MG oral solid    FIBER SELECT GUMMIES PO    Multiple Vitamins-Minerals (HM MULTIVITAMIN ADULT GUMMY PO)    Ascorbic Acid (VITAMIN C ADULT GUMMIES PO)    Cholecalciferol (VITAMIN D3) 50 MCG (2000 UT) capsule    cetirizine (ZYRTEC) 1 MG/ML syrup    oxygen    Blood Glucose Monitoring Suppl (FIFTY50 GLUCOSE METER 2.0) w/Device KIT    EPINEPHrine 0.15 mg/0.3 mL auto-injector    lactulose (LACTULOSE) 20 GM/30ML solution    levETIRAcetam (KEPPRA) 100 MG/ML solution    miconazole (MICATIN) 2 % powder    insulin pen needle (FIFTY50 PEN NEEDLES) 31G X 8 MM     No current facility-administered medications for this visit.          Objective     OBJECTIVE      BP 150/80    Pulse 78    SpO2 95%     Exam limited by video interaction  Seen in wheelchair   Alert, no acute distress  Normal respiratory effort         ASSESSMENT / DIAGNOSIS       1. Type 2 diabetes mellitus  with hyperglycemia, with long-term current use of insulin  BGs still not well controlled, will increase nutritional baseline to ten and continue glargine at current dose  - insulin aspart (NOVOLOG FLEXPEN) 100 UNIT/ML injection pen; Inject SQ before meals: Nutritional BL: 7 units + SS as follows: <70: hold BL, 70-200: BL, > 200: BL+2, >250: BL+4, >300: BL+6, >350: BL +8. Alert MD for BG <70 and >350.  Dispense: 3 each; Refill: 5    2. Traumatic brain injury, with loss of consciousness of 30 minutes or less, subsequent encounter  underlying condition, all treatment decisions made with this in consideration. He gets cared for by PDN [name] is out on leave currently, Shelly, and a new nurse is being trained, [name] is the supervisor)    3. Chronic respiratory failure with hypoxia  Back to normal settings of RA during day and vent dependent at night    4. Moderate persistent asthma, unspecified whether complicated  Continue home nebu regimen      Orders Placed This Encounter    insulin aspart (NOVOLOG FLEXPEN) 100 UNIT/ML injection pen     Patient Instructions   Increase nutritional baseline to 10 units plus the sliding scale         --Patient instructed to call if symptoms are not improving or worsening  --Follow-up arranged  Return in about 4 weeks (around 04/01/2020).         Electronically signed by Doretha Imus, MD , 04/02/2020 @   [place], Phone: [phone]

## 2020-03-04 NOTE — Progress Notes (Signed)
Call placed to dad to pre screen patient for scheduled video  visit at 3pm. Aretta Nip answered the phone and request writer to speak with nurse to review medications and allergies. Dad states he will not be available for the visit today but Roberto's nurse [name] will be at video visit. Dad will send the link to [facility]. [name] is unable to come to the phone to speak with writer at this time as she is in the middle of ADL's with patient. [name] states she will be there for video visit today at 3pm.

## 2020-03-05 ENCOUNTER — Encounter: Payer: Self-pay | Admitting: Gastroenterology

## 2020-03-05 ENCOUNTER — Telehealth: Payer: Self-pay | Admitting: Pediatrics

## 2020-03-05 NOTE — Telephone Encounter (Signed)
Incoming paperwork for completion to the [facility]  Date Received: 03/05/2020    Type of paperwork (sender:form type): nurse core     Completed paperwork to be sent to:  Fax to [phone]    Special Requests:     Placed in box for: [name]

## 2020-03-05 NOTE — Telephone Encounter (Signed)
I reviewed and signed the orders, and they can be returned.  I also made a note on the cover sheet.  Please return that as well.  Thanks.

## 2020-03-05 NOTE — Telephone Encounter (Signed)
Form and cover sheet faxed to [phone] and sent to scanning.

## 2020-03-07 ENCOUNTER — Encounter: Payer: Self-pay | Admitting: Pediatrics

## 2020-03-09 ENCOUNTER — Telehealth: Payer: Self-pay | Admitting: Pediatrics

## 2020-03-09 NOTE — Telephone Encounter (Signed)
[facility] TELEPHONE TRIAGE     Reason for call: [name] from nurse core called regarding patient's vesting and albuterol directions. She would like to know if PCP would for patient to use them 3 or 4 times a day. [name] is requesting a call back as soon as possible being that it is the end of the month and new orders need to be made.    Name of caller:[name] for [name]  Relationship to patient: nurse core RN  Phone:  [phone]

## 2020-03-10 ENCOUNTER — Telehealth: Payer: Self-pay | Admitting: Neurology

## 2020-03-10 NOTE — Telephone Encounter (Signed)
Received orders to be signed from Nurse Core. Placed in Lindsay's mailbox.

## 2020-03-10 NOTE — Telephone Encounter (Signed)
Paperwork completed 03/10/2020  Sent as outlined in prior documentation

## 2020-03-12 ENCOUNTER — Telehealth: Payer: Self-pay | Admitting: Pediatrics

## 2020-03-12 MED ORDER — CIPROFLOXACIN-DEXAMETHASONE 0.3-0.1 % OT SUSP *I*
4.0000 [drp] | Freq: Two times a day (BID) | OTIC | 0 refills | Status: AC
Start: 2020-03-12 — End: 2020-03-19

## 2020-03-12 NOTE — Addendum Note (Signed)
Addended by: Midge Minium on: 03/12/2020 11:41 AM     Modules accepted: Orders

## 2020-03-12 NOTE — Telephone Encounter (Signed)
Per last visit note with PCP: "Lung exam improved after A+A neb in office, would continue increased vesting until Sunday AM and then go back to BID vesting.  Also need to increase ipratropium to four times a day while sick.  Hold off on antibiotics for now."

## 2020-03-12 NOTE — Telephone Encounter (Signed)
[facility] TELEPHONE TRIAGE     Reason for call: Inocencio Homes reports patient's right ear has brown/black drainage. These symptoms occurred last night and the ear appears to be swollen. Inocencio Homes is inquiring if PCP wants to do a culture on it and is requesting ears drops to be sent into pharmacy before the long weekend. Inocencio Homes has requested a call back.    Name of caller:Gayle for [name]  Relationship to patient: RN  Organization (if applicable): Nurse Core  Phone:  [phone]

## 2020-03-12 NOTE — Telephone Encounter (Signed)
Am happy to send in ear drops, but do not think a culture is necessary at this point.

## 2020-03-12 NOTE — Telephone Encounter (Signed)
Called back and spoke with [name], discussed script for ear drops, no need for culture.  [name] raised concern regarding cipro component and potential need for benadryl.  Discussed in person with Dr [name].  No need to pre-treat with benadryl.  Relayed to [facility], verbalized understanding and declined further questions at this time.

## 2020-03-16 ENCOUNTER — Encounter: Payer: Self-pay | Admitting: Gastroenterology

## 2020-03-17 NOTE — Telephone Encounter (Signed)
Attempted to return call to [facility] and nurse core, transferred to other staff Stacy.  Advised that if patient symptoms had improved/resolved that they could return to previous A&A and vesting instructions.  Encouraged calls with any further questions.  Stacy verbalized understanding, will update team.

## 2020-03-18 ENCOUNTER — Other Ambulatory Visit: Payer: Self-pay | Admitting: Pediatrics

## 2020-03-18 MED ORDER — INSULIN GLARGINE (2 UNIT DIAL) 300 UNIT/ML SC SOPN *I*
90.0000 [IU] | PEN_INJECTOR | Freq: Every evening | SUBCUTANEOUS | 5 refills | Status: DC
Start: 2020-03-18 — End: 2020-04-15

## 2020-03-19 NOTE — Telephone Encounter (Signed)
ciprofloxacin-dexAMETHasone (CIPRODEX) otic suspension has been sent for a pa

## 2020-03-19 NOTE — Telephone Encounter (Signed)
[facility] reports this medication is not covered by insurance. Paige suggested PCP send in an alternative or attempt a PA.     Phone: [phone]

## 2020-03-22 ENCOUNTER — Telehealth: Payer: Self-pay | Admitting: Pediatrics

## 2020-03-22 MED ORDER — NEOMYCIN-POLYMYXIN-HC 3.5-10000-1 OT SOLN *I*
4.0000 [drp] | Freq: Four times a day (QID) | OTIC | 0 refills | Status: AC
Start: 2020-03-22 — End: 2020-03-29

## 2020-03-22 NOTE — Telephone Encounter (Signed)
ciprofloxacin-dexAMETHasone (CIPRODEX) otic suspension has denied. This medication is non formulary. Insurance states patient must try and fail with at least 2 formulary alternatives. Those alternatives are:  neomycin-polymyxin hc otic 1% solution,  ciprodex otic suspension 0.3-0.1 %( brand product) ,

## 2020-03-22 NOTE — Telephone Encounter (Signed)
Incoming paperwork for completion to the [facility]  Date Received: 03/22/2020    Type of paperwork (sender:form type): [employer]    Completed paperwork to be sent to:  Fax to [phone]    Special Requests:     Placed in box for: [name]

## 2020-03-22 NOTE — Telephone Encounter (Signed)
Please call Holt's family and let them know I resent a new Rx for ear drops

## 2020-03-22 NOTE — Addendum Note (Signed)
Addended by: Midge Minium on: 03/22/2020 10:45 PM     Modules accepted: Orders

## 2020-03-23 ENCOUNTER — Encounter: Payer: Self-pay | Admitting: Gastroenterology

## 2020-03-23 NOTE — Telephone Encounter (Signed)
Paperwork completed 03/23/2020  Sent as outlined in prior documentation

## 2020-03-24 ENCOUNTER — Encounter: Payer: Self-pay | Admitting: Gastroenterology

## 2020-03-24 ENCOUNTER — Telehealth: Payer: Self-pay | Admitting: Pediatrics

## 2020-03-25 ENCOUNTER — Other Ambulatory Visit: Payer: Self-pay | Admitting: Pediatrics

## 2020-03-25 ENCOUNTER — Other Ambulatory Visit: Payer: Self-pay | Admitting: Internal Medicine

## 2020-03-25 DIAGNOSIS — L309 Dermatitis, unspecified: Secondary | ICD-10-CM

## 2020-03-25 DIAGNOSIS — J4540 Moderate persistent asthma, uncomplicated: Secondary | ICD-10-CM

## 2020-03-25 NOTE — Telephone Encounter (Signed)
Called to confirm they received new script.  New question regarding whether to put drops into both ears or only Right ear which is the ear that had drainage of concern.    Discussed I would forward to Dr [name] for clarification.

## 2020-03-25 NOTE — Telephone Encounter (Signed)
Pending in separate encounter

## 2020-03-26 MED ORDER — BUDESONIDE 0.25 MG/2ML IN SUSP *I*
0.2500 mg | Freq: Two times a day (BID) | RESPIRATORY_TRACT | 1 refills | Status: DC
Start: 2020-03-26 — End: 2020-05-13

## 2020-03-26 NOTE — Telephone Encounter (Signed)
Spoke to [employer], Consulting civil engineer.  Confirmed dosing ear drops to right ear only.  No further questions.

## 2020-03-30 ENCOUNTER — Other Ambulatory Visit: Payer: Self-pay | Admitting: Pediatrics

## 2020-03-30 DIAGNOSIS — E1165 Type 2 diabetes mellitus with hyperglycemia: Secondary | ICD-10-CM

## 2020-03-30 DIAGNOSIS — Z794 Long term (current) use of insulin: Secondary | ICD-10-CM

## 2020-04-01 ENCOUNTER — Encounter: Payer: Self-pay | Admitting: Pediatrics

## 2020-04-01 ENCOUNTER — Telehealth: Payer: Self-pay | Admitting: Pediatrics

## 2020-04-01 DIAGNOSIS — J9600 Acute respiratory failure, unspecified whether with hypoxia or hypercapnia: Secondary | ICD-10-CM

## 2020-04-01 DIAGNOSIS — Z930 Tracheostomy status: Secondary | ICD-10-CM

## 2020-04-01 DIAGNOSIS — Z9911 Dependence on respirator [ventilator] status: Secondary | ICD-10-CM

## 2020-04-01 DIAGNOSIS — J9611 Chronic respiratory failure with hypoxia: Secondary | ICD-10-CM

## 2020-04-01 DIAGNOSIS — Z8669 Personal history of other diseases of the nervous system and sense organs: Secondary | ICD-10-CM

## 2020-04-01 NOTE — Telephone Encounter (Signed)
[facility] TELEPHONE INTAKE    Reason for call: [name] the nurse from [facility] called and requested to talk to a nurse about new medication scripts. Requesting a call back.    Name of caller: [name]  Relationship to patient: [facility] Nurse  Phone: [phone]

## 2020-04-01 NOTE — Telephone Encounter (Signed)
Attempted to call [name] back, busy signal.

## 2020-04-02 NOTE — Telephone Encounter (Signed)
Angel Wells home care nurse for pt called with requests.     Pt has a area of concern for skin breakdown to toe caused by pulse oximeter.    Would like a Rx for disposable O2 sensor would like 4   Rx for replacement tape for O2 sensors would like 15  30 trach holders/month.   Currently they only have 20 and they have to change trach daily.   The above orders can be faxed to [facility] attn Annetta Fax [phone]    Angel Wells says that ears are filled with wax, is requesting Debrox for bilateral ears.  [facility] [address] [employer] [place]    Will route to PCP for review.

## 2020-04-04 MED ORDER — CARBAMIDE PEROXIDE 6.5 % OT SOLN *I*
OTIC | 5 refills | Status: DC
Start: 2020-04-04 — End: 2020-05-13

## 2020-04-04 MED ORDER — GENERIC DME *A*
5 refills | Status: DC
Start: 2020-04-04 — End: 2020-04-14

## 2020-04-05 ENCOUNTER — Other Ambulatory Visit: Payer: Self-pay | Admitting: Pediatrics

## 2020-04-05 DIAGNOSIS — M2450 Contracture, unspecified joint: Secondary | ICD-10-CM

## 2020-04-05 DIAGNOSIS — R569 Unspecified convulsions: Secondary | ICD-10-CM

## 2020-04-05 MED ORDER — GENERIC DME *A*
5 refills | Status: DC
Start: 2020-04-05 — End: 2022-06-23

## 2020-04-05 NOTE — Telephone Encounter (Signed)
These are trach ties. Order pended

## 2020-04-07 ENCOUNTER — Ambulatory Visit: Payer: Medicare Other | Admitting: Pediatrics

## 2020-04-14 ENCOUNTER — Ambulatory Visit: Payer: Medicare Other | Attending: Pediatrics | Admitting: Pediatrics

## 2020-04-14 ENCOUNTER — Encounter: Payer: Self-pay | Admitting: Gastroenterology

## 2020-04-14 VITALS — BP 136/82 | HR 84 | Temp 97.0°F | Ht 65.0 in

## 2020-04-14 DIAGNOSIS — E1165 Type 2 diabetes mellitus with hyperglycemia: Secondary | ICD-10-CM

## 2020-04-14 DIAGNOSIS — J9611 Chronic respiratory failure with hypoxia: Secondary | ICD-10-CM | POA: Insufficient documentation

## 2020-04-14 DIAGNOSIS — S069X9A Unspecified intracranial injury with loss of consciousness of unspecified duration, initial encounter: Secondary | ICD-10-CM | POA: Insufficient documentation

## 2020-04-14 DIAGNOSIS — J4540 Moderate persistent asthma, uncomplicated: Secondary | ICD-10-CM | POA: Insufficient documentation

## 2020-04-14 DIAGNOSIS — Z23 Encounter for immunization: Secondary | ICD-10-CM

## 2020-04-14 DIAGNOSIS — G40219 Localization-related (focal) (partial) symptomatic epilepsy and epileptic syndromes with complex partial seizures, intractable, without status epilepticus: Secondary | ICD-10-CM

## 2020-04-14 DIAGNOSIS — E119 Type 2 diabetes mellitus without complications: Secondary | ICD-10-CM | POA: Insufficient documentation

## 2020-04-14 DIAGNOSIS — J9600 Acute respiratory failure, unspecified whether with hypoxia or hypercapnia: Secondary | ICD-10-CM | POA: Insufficient documentation

## 2020-04-14 DIAGNOSIS — S069XAA Unspecified intracranial injury with loss of consciousness status unknown, initial encounter: Secondary | ICD-10-CM

## 2020-04-14 DIAGNOSIS — Z794 Long term (current) use of insulin: Secondary | ICD-10-CM | POA: Insufficient documentation

## 2020-04-14 DIAGNOSIS — Z9911 Dependence on respirator [ventilator] status: Secondary | ICD-10-CM | POA: Insufficient documentation

## 2020-04-14 DIAGNOSIS — G8250 Quadriplegia, unspecified: Secondary | ICD-10-CM | POA: Insufficient documentation

## 2020-04-14 MED ORDER — GENERIC DME *A*
11 refills | Status: DC
Start: 2020-04-14 — End: 2022-06-23

## 2020-04-14 MED ORDER — MIC-KEY GASTROSTOMY BOLUS FEED EXTENSION 12IN 30CM MISC *A*
11 refills | Status: DC
Start: 2020-04-14 — End: 2023-10-10

## 2020-04-14 MED ORDER — CLONAZEPAM 1 MG PO TABS *I*
ORAL_TABLET | ORAL | 0 refills | Status: DC
Start: 2020-04-14 — End: 2020-05-20

## 2020-04-14 MED ORDER — SUCTION MACHINE DEVI *A*
0 refills | Status: DC
Start: 2020-04-14 — End: 2020-12-30

## 2020-04-14 MED ORDER — GENERIC DME *A*
5 refills | Status: DC
Start: 2020-04-14 — End: 2020-10-27

## 2020-04-14 MED ORDER — IPRATROPIUM BROMIDE 0.02 % IN SOLN *I*
500.0000 ug | Freq: Four times a day (QID) | RESPIRATORY_TRACT | Status: DC | PRN
Start: 2020-04-14 — End: 2020-04-19

## 2020-04-14 MED ORDER — SUCTION MACHINE DEVI *A*
0 refills | Status: DC
Start: 2020-04-14 — End: 2020-04-14

## 2020-04-14 MED ORDER — NYSTATIN 100000 UNIT/GM EX POWD  *I*
CUTANEOUS | 5 refills | Status: DC
Start: 2020-04-14 — End: 2020-05-13

## 2020-04-14 NOTE — Progress Notes (Signed)
[facility]    Office Visit Note     REASON FOR VISIT      Chief Complaint   Patient presents with    Follow-up    Type 2 diabetes mellitus         PRIMARY DIAGNOSIS      TBI    Subjective     SUBJECTIVE      Angel Wells was accompanied by [facility] RN who provided all of subjective history       Trach dependent, vent dependent at night  -Needs new suction machine - battery is out, only works when it is plugged in at home but for outings out of the house it is not functioning    BGs - reviewed BG logs from home, typically he is only eating 2 meals per day but BGs are mostly improved ranging from 150-high 200s, has had some rare 300s before dinner but not as frequent as before      ROS  Review of Systems as per HPI above    Past Medical History, Social History, Family History, and Medications/allergies reviewed during this visit    Current Outpatient Medications   Medication    Insulin Glargine, 2 Unit Dial, 300 UNIT/ML injection pen    ipratropium (ATROVENT) 0.02 % nebulizer solution    generic DME    generic DME    generic DME    nystatin (NYSTATIN) powder    VALPROIC ACID 250 MG/5ML solution    baclofen (LIORESAL) 10 MG tablet    generic DME    carbamide peroxide (DEBROX) 6.5 % otic solution    B-D UF III MINI PEN NEEDLES 31G X 5 MM    triamcinolone (KENALOG) 0.025 % cream    budesonide (PULMICORT) 0.25 MG/2ML nebulizer solution    insulin aspart (NOVOLOG FLEXPEN) 100 UNIT/ML injection pen    lacosamide (VIMPAT) 10 mg/mL SOLN oral solution    fluticasone (FLONASE) 50 MCG/ACT nasal spray    famotidine (PEPCID) 20 MG tablet    ACCU-CHEK AVIVA PLUS test strip    senna (SENOKOT) 8.6 MG tablet    generic DME    docusate sodium (COLACE) 10 mg/mL liquid    sennosides (SENOKOT) 8.8 mg/30m syrup    albuterol (PROVENTIL) (2.5 mg/363m 0.083% nebulizer solution    MIC-KEY gastrostomy feeding tube extension set 24"    Syringe Disposable 60 ML MISC    MIC-KEY gastrostomy kit 14FR     gauze pads 2"X2"    generic DME    Nebulizers (PARI LC PLUS VIOS PRO NEB) MISC    tobramycin, PF, (TOBI) 300 MG/5ML nebulizer solution    generic DME    generic DME    nebulizer device with mask and tubing    generic DME    generic DME    Accu-Chek FastClix Lancets MISC    generic DME    lactobacillus rhamnosus, GG, (CULTURELLE) capsule    Gauze Pads & Dressings (SPLIT GAUZE DRAINAGE SPONGE) 4"X4"    generic DME    Nebulizer MISC    generic DME    generic DME    gauze sponge (CURITY) 4"X4" pads    generic DME    Adhesive Tape 1"x5yd TAPE    generic DME    generic DME    Distilled Water LIQD    generic DME    generic DME    generic DME    generic DME    generic DME    generic DME  generic DME    FIBER SELECT GUMMIES PO    Multiple Vitamins-Minerals (HM MULTIVITAMIN ADULT GUMMY PO)    Ascorbic Acid (VITAMIN C ADULT GUMMIES PO)    Cholecalciferol (VITAMIN D3) 50 MCG (2000 UT) capsule    cetirizine (ZYRTEC) 1 MG/ML syrup    oxygen    Blood Glucose Monitoring Suppl (FIFTY50 GLUCOSE METER 2.0) w/Device KIT    lactulose (LACTULOSE) 20 GM/30ML solution    levETIRAcetam (KEPPRA) 100 MG/ML solution    miconazole (MICATIN) 2 % powder    insulin pen needle (FIFTY50 PEN NEEDLES) 31G X 8 MM    ipratropium (ATROVENT) 0.02 % nebulizer solution    MIC-KEY gastrostomy feeding tube extension set 12"    suction machine    clonazePAM (KLONOPIN) 1 MG tablet    ibuprofen (ADVIL,MOTRIN) 600 MG tablet    bisacodyl (DULCOLAX) 10 MG suppository    zinc oxide (DESITIN) 40 % paste    sodium chloride 0.9 % nebulizer solution    surgical lubricant (SURGILUBE) gel    diphenhydrAMINE (BENADRYL) 25 MG oral solid    EPINEPHrine 0.15 mg/0.3 mL auto-injector     No current facility-administered medications for this visit.          Objective     OBJECTIVE      BP 136/82    Pulse 84    Temp 36.1 C (97 F) (Temporal)    Ht 1.651 m (5' 5")    SpO2 95%    BMI 29.35 kg/m     Physical  Exam  Constitutional:       Appearance: He is normal weight.      Comments: Examined in wheelchair, patient cooperative with exam, RN reminds him to interact with her and Probation officer (seems to be having conversations with himself)   HENT:      Head: Normocephalic.      Left Ear: External ear normal.      Ears:      Comments: R ear with whitish drainage in the ear canal, could not visualize perforation, TM otherwise clear     Mouth/Throat:      Mouth: Mucous membranes are moist.   Neck:      Comments: Trach site clean dry intact  Cardiovascular:      Rate and Rhythm: Normal rate and regular rhythm.      Pulses: Normal pulses.   Pulmonary:      Effort: Pulmonary effort is normal.      Breath sounds: Normal breath sounds. No wheezing.   Abdominal:      General: Abdomen is flat.      Palpations: Abdomen is soft.      Comments: gtube site with mild granulation on R side, treated with silver nitrate   Skin:     General: Skin is warm and dry.      Capillary Refill: Capillary refill takes less than 2 seconds.   Neurological:      Mental Status: He is alert. Mental status is at baseline.      Comments: Can respond to questions but most of history obtained from PDN              ASSESSMENT / DIAGNOSIS         1. Ventilator dependence  Will [facility] to [facility] for suction machine and trach/pulse ox supplies  - generic DME; TRACH TIES WITH O2 PORT. Use as directed.MCAID ID [id number]  Dispense: 30 each; Refill: 11  - generic DME; Nonin Pulse  ox sensors (disposable)Use as directed.MCAID ID [id number], LON 99  Dispense: 4 each; Refill: 5  - generic DME; Pulse ox probe replacement tape. Use as directed.[id number], LON 99  Dispense: 15 each; Refill: 5  - suction machine; Instructions for use: Use as needed for secretions, LON: 99 months, [id number]  Dispense: 1 each; Refill: 0    2. TBI (traumatic brain injury)  underlying condition, all treatment decisions made with this in consideration. Shelly reports his mood has been good, he  occasionally talks to himself but overall mood stable    3. 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    4. Type 2 diabetes mellitus with hyperglycemia, with long-term current use of insulin  Increase toujeo to 100 units and continue sliding scale with nutritional baseline of 10.  OVerall sugars have improved, recheck HbA1c (home nurse to draw), PDN nurse requested if he could get CGM, Spoke to Dad by phone and he is ok with endocrine referral so they can help obtain CGM  - Comprehensive metabolic panel; Future  - CBC and differential; Future  - CBC and differential    5. Respirator dependence, status  Goal would be to keep sats>90% (he often dips to mid 80s when he is first transitioning off the vent in the mornings)  Will continue atrovent/albuterol BID to give together at 10am 10pm  Also ok to use them PRN  - generic DME; TRACH TIES WITH O2 PORT. Use as directed.MCAID ID [id number]  Dispense: 30 each; Refill: 11  - generic DME; Nonin Pulse ox sensors (disposable)Use as directed.MCAID ID [id number], LON 99  Dispense: 4 each; Refill: 5  - generic DME; Pulse ox probe replacement tape. Use as directed.[id number], LON 99  Dispense: 15 each; Refill: 5    6.  Moderate persistent asthma without complication  Will need to ask Dad if he has received pneumonia vaccine as do not have a record of this  - ipratropium (ATROVENT) 0.02 % nebulizer solution; Inhale 2.5 mLs (500 mcg total) by nebulization 2 times daily  for Asthma Dx: J45.4  Dispense: 150 mL; Refill: 5      Orders Placed This Encounter    Flu vaccine quadrivalent greater than or equal to 3yo preservative free IM    Hemoglobin A1c    Comprehensive metabolic panel    CBC and differential    ipratropium (ATROVENT) 0.02 % nebulizer solution    generic DME    MIC-KEY gastrostomy feeding tube extension set 12"    generic DME    generic DME    suction machine    nystatin  (NYSTATIN) powder    clonazePAM (KLONOPIN) 1 MG tablet    Insulin Glargine, 2 Unit Dial, 300 UNIT/ML injection pen    ipratropium (ATROVENT) 0.02 % nebulizer solution     Patient Instructions   Please give albuterol and atrovent nebs at the same time: 10am and 10pm    We [facility]    Check BGs prior to his meals  Please redraw HbA1c at home  Increase dose of insulin glargine to 100 units SQ daily    We will weigh his wheelchair at the next visit    We put it in a new referral for OT, You can restart using salve around gtube site on Saturday    Please complete this survey to help [place] better serve you.  [email or web address]               --  Patient instructed to call if symptoms are not improving or worsening  --Follow-up arranged  No follow-ups on file.       Electronically signed by Doretha Imus, MD , 04/15/2020 @   [place], Phone: [phone]

## 2020-04-14 NOTE — Patient Instructions (Addendum)
Please give albuterol and atrovent nebs at the same time: 10am and 10pm    We sent rx to [facility]    Check BGs prior to his meals  Please redraw HbA1c at home  Increase dose of insulin glargine to 100 units SQ daily    We will weigh his wheelchair at the next visit    We put it in a new referral for OT, You can restart using salve around gtube site on Saturday    Please complete this survey to help [place] better serve you.  [email or web address]

## 2020-04-15 LAB — CBC AND DIFFERENTIAL
Baso # K/uL: 0 10*3/uL (ref 0.0–0.1)
Basophil %: 0.5 %
Eos # K/uL: 0.1 10*3/uL (ref 0.0–0.5)
Eosinophil %: 1.8 %
Hematocrit: 38 % — ABNORMAL LOW (ref 40–51)
Hemoglobin: 12.9 g/dL — ABNORMAL LOW (ref 13.7–17.5)
IMM Granulocytes #: 0.1 10*3/uL — ABNORMAL HIGH (ref 0.0–0.0)
IMM Granulocytes: 0.9 %
Lymph # K/uL: 1.8 10*3/uL (ref 1.3–3.6)
Lymphocyte %: 23.1 %
MCH: 29 pg (ref 26–32)
MCHC: 34 g/dL (ref 32–37)
MCV: 87 fL (ref 79–92)
Mono # K/uL: 0.5 10*3/uL (ref 0.3–0.8)
Monocyte %: 6.4 %
Neut # K/uL: 5.3 10*3/uL (ref 1.8–5.4)
Nucl RBC # K/uL: 0 10*3/uL (ref 0.0–0.0)
Nucl RBC %: 0 /100 WBC (ref 0.0–0.2)
Platelets: 257 10*3/uL (ref 150–330)
RBC: 4.4 MIL/uL — ABNORMAL LOW (ref 4.6–6.1)
RDW: 13.3 % (ref 11.6–14.4)
Seg Neut %: 67.3 %
WBC: 7.9 10*3/uL (ref 4.2–9.1)

## 2020-04-15 MED ORDER — IPRATROPIUM BROMIDE 0.02 % IN SOLN *I*
500.0000 ug | Freq: Two times a day (BID) | RESPIRATORY_TRACT | 5 refills | Status: DC
Start: 2020-04-15 — End: 2020-05-13

## 2020-04-15 MED ORDER — INSULIN GLARGINE (2 UNIT DIAL) 300 UNIT/ML SC SOPN *I*
100.0000 [IU] | PEN_INJECTOR | Freq: Every evening | SUBCUTANEOUS | 5 refills | Status: DC
Start: 2020-04-15 — End: 2020-05-13

## 2020-04-15 NOTE — Progress Notes (Signed)
Absolutely he will. We can help you with process. If we could see him  Once, we can order for you.    Let me know!

## 2020-04-16 ENCOUNTER — Other Ambulatory Visit: Payer: Self-pay | Admitting: Pediatrics

## 2020-04-16 DIAGNOSIS — S069X9A Unspecified intracranial injury with loss of consciousness of unspecified duration, initial encounter: Secondary | ICD-10-CM

## 2020-04-16 DIAGNOSIS — Z9911 Dependence on respirator [ventilator] status: Secondary | ICD-10-CM

## 2020-04-16 DIAGNOSIS — J9611 Chronic respiratory failure with hypoxia: Secondary | ICD-10-CM

## 2020-04-16 DIAGNOSIS — S069XAA Unspecified intracranial injury with loss of consciousness status unknown, initial encounter: Secondary | ICD-10-CM

## 2020-04-16 LAB — HEMOGLOBIN A1C: Hemoglobin A1C: 7.3 % — ABNORMAL HIGH

## 2020-04-16 NOTE — Telephone Encounter (Signed)
[facility] trach tie, mickey, nonin pulse ox,pulse ox pobe,LON:99 months

## 2020-04-18 ENCOUNTER — Other Ambulatory Visit: Payer: Self-pay | Admitting: Pediatrics

## 2020-04-18 ENCOUNTER — Telehealth: Payer: Self-pay | Admitting: Pediatrics

## 2020-04-18 DIAGNOSIS — E1165 Type 2 diabetes mellitus with hyperglycemia: Secondary | ICD-10-CM

## 2020-04-18 DIAGNOSIS — Z794 Long term (current) use of insulin: Secondary | ICD-10-CM

## 2020-04-18 NOTE — Telephone Encounter (Signed)
Please let Angel Wells's nurse/dad know his HbA1c is above goal but we just made insulin changes so we will not make further changes at this time, plan to repeat HbA1c in 3 months

## 2020-04-19 ENCOUNTER — Telehealth: Payer: Self-pay | Admitting: Pediatrics

## 2020-04-19 DIAGNOSIS — J4540 Moderate persistent asthma, uncomplicated: Secondary | ICD-10-CM

## 2020-04-19 MED ORDER — FLUTICASONE PROPIONATE 50 MCG/ACT NA SUSP *I*
1.0000 | Freq: Every day | NASAL | 5 refills | Status: DC
Start: 2020-04-19 — End: 2020-05-11

## 2020-04-19 MED ORDER — IPRATROPIUM BROMIDE 0.02 % IN SOLN *I*
500.0000 ug | Freq: Four times a day (QID) | RESPIRATORY_TRACT | 5 refills | Status: DC | PRN
Start: 2020-04-19 — End: 2020-04-23

## 2020-04-19 NOTE — Telephone Encounter (Addendum)
Nurse and Father notified. No further needs.

## 2020-04-19 NOTE — Telephone Encounter (Signed)
[facility] TELEPHONE TRIAGE     Reason for call:  [name] requests a new Rx order for four extra doses as needed daily is sent to the pharmacy. [name] relayed this Rx order was discussed during the patients last appointment. [name] states the pharmacy confirmed they did not receive this order.     Name of caller: [name] for Angel Wells  Relationship to patient: PDN  Phone: [phone]

## 2020-04-21 ENCOUNTER — Telehealth: Payer: Self-pay

## 2020-04-21 NOTE — Telephone Encounter (Signed)
ok 

## 2020-04-21 NOTE — Telephone Encounter (Signed)
Pt with DM2 uncontrolled, interested in CGM monitoring    Please review and advise
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
## 2020-04-22 NOTE — Telephone Encounter (Signed)
Attempt 1 of 2: No answer, message left.

## 2020-04-23 ENCOUNTER — Encounter: Payer: Self-pay | Admitting: Pediatrics

## 2020-04-23 MED ORDER — IPRATROPIUM BROMIDE 0.02 % IN SOLN *I*
500.0000 ug | Freq: Four times a day (QID) | RESPIRATORY_TRACT | 5 refills | Status: DC | PRN
Start: 2020-04-23 — End: 2020-05-13

## 2020-04-23 NOTE — Addendum Note (Signed)
Addended by: Midge Minium on: 04/23/2020 03:24 PM     Modules accepted: Orders

## 2020-04-23 NOTE — Telephone Encounter (Signed)
Pharmacy called says script needs a diagnosis code. Patient care taker wants it 4 times a day. Pharmacy called and needs diagnosis code to fill.    Any questions call pharmacy at [phone]

## 2020-04-23 NOTE — Telephone Encounter (Signed)
New rx for PRN dose sent to pharmacy as requested with dx code

## 2020-04-26 ENCOUNTER — Ambulatory Visit: Payer: Medicare Other | Admitting: Rehabilitative and Restorative Service Providers"

## 2020-04-26 ENCOUNTER — Ambulatory Visit: Payer: Medicare Other

## 2020-04-26 NOTE — Telephone Encounter (Signed)
Attempt 2 of 2: No answer, message left.

## 2020-04-27 ENCOUNTER — Other Ambulatory Visit: Payer: Self-pay | Admitting: Pediatrics

## 2020-04-27 DIAGNOSIS — J4540 Moderate persistent asthma, uncomplicated: Secondary | ICD-10-CM

## 2020-04-28 ENCOUNTER — Encounter: Payer: Self-pay | Admitting: Pediatrics

## 2020-04-29 ENCOUNTER — Telehealth: Payer: Self-pay | Admitting: Pediatrics
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
## 2020-04-29 NOTE — Telephone Encounter (Signed)
There seems to be come confusion regarding scripts for Angel Wells. [facility] received the 5 prescriptinos, then they told pt's mom he needs new machine. We've faxed over the same scripts thinking that is what was needed. We need one more script stating "[facility] with Supplies" Sorry for the confusion.      Fax- [phone] Attn: Marchelle Folks

## 2020-04-29 NOTE — Telephone Encounter (Signed)
Incoming [facility]  Date Received: 04/29/2020    Type of paperwork (sender:form type): [facility] Telephone Orders     Completed paperwork to be sent to:  Fax to [phone]    Special Requests:     Placed in box for: [name]

## 2020-04-30 MED ORDER — PULSE OXIMETER MISC *A*
0 refills | Status: DC
Start: 2020-04-30 — End: 2020-10-27

## 2020-04-30 NOTE — Addendum Note (Signed)
Addended by: Midge Minium on: 04/30/2020 09:32 AM     Modules accepted: Orders

## 2020-04-30 NOTE — Telephone Encounter (Signed)
Rx printed, please fax.

## 2020-05-03 NOTE — Telephone Encounter (Signed)
Paperwork completed 05/03/2020  Sent as outlined in prior documentation

## 2020-05-03 NOTE — Telephone Encounter (Signed)
A user error has taken place: encounter opened in error, closed for administrative reasons.

## 2020-05-04 ENCOUNTER — Ambulatory Visit: Payer: Medicare Other

## 2020-05-04 ENCOUNTER — Ambulatory Visit: Payer: Medicare Other | Admitting: Rehabilitative and Restorative Service Providers"

## 2020-05-04 ENCOUNTER — Other Ambulatory Visit: Payer: Self-pay | Admitting: Pediatrics

## 2020-05-04 DIAGNOSIS — J4540 Moderate persistent asthma, uncomplicated: Secondary | ICD-10-CM

## 2020-05-11 ENCOUNTER — Telehealth: Payer: Self-pay | Admitting: Pediatrics

## 2020-05-11 ENCOUNTER — Other Ambulatory Visit: Payer: Self-pay | Admitting: Pediatrics

## 2020-05-11 DIAGNOSIS — Z794 Long term (current) use of insulin: Secondary | ICD-10-CM

## 2020-05-11 DIAGNOSIS — E1165 Type 2 diabetes mellitus with hyperglycemia: Secondary | ICD-10-CM

## 2020-05-11 MED ORDER — FLUTICASONE PROPIONATE 50 MCG/ACT NA SUSP *I*
1.0000 | Freq: Every day | NASAL | 5 refills | Status: DC
Start: 2020-05-11 — End: 2020-05-13

## 2020-05-11 NOTE — Telephone Encounter (Signed)
Placed referral for eye doctor at [facility])  Please let Mitzi Davenport know

## 2020-05-11 NOTE — Telephone Encounter (Signed)
[facility] TELEPHONE TRIAGE     Reason for call: Irena, [facility] RN called requesting recommendations for eye doctors that specialize with patients with disabilities. Mitzi Davenport is requesting a call back either today or on Thursday.     Name of caller:Shelby for Angel Wells  Relationship to patient: RN  Phone:  [phone]

## 2020-05-12 ENCOUNTER — Other Ambulatory Visit: Payer: Self-pay | Admitting: Pediatrics

## 2020-05-12 ENCOUNTER — Encounter: Payer: Self-pay | Admitting: Gastroenterology

## 2020-05-12 DIAGNOSIS — Z794 Long term (current) use of insulin: Secondary | ICD-10-CM

## 2020-05-12 DIAGNOSIS — Z9911 Dependence on respirator [ventilator] status: Secondary | ICD-10-CM

## 2020-05-12 DIAGNOSIS — R569 Unspecified convulsions: Secondary | ICD-10-CM

## 2020-05-12 DIAGNOSIS — G40219 Localization-related (focal) (partial) symptomatic epilepsy and epileptic syndromes with complex partial seizures, intractable, without status epilepticus: Secondary | ICD-10-CM

## 2020-05-12 DIAGNOSIS — L309 Dermatitis, unspecified: Secondary | ICD-10-CM

## 2020-05-12 DIAGNOSIS — J4540 Moderate persistent asthma, uncomplicated: Secondary | ICD-10-CM

## 2020-05-12 DIAGNOSIS — M2450 Contracture, unspecified joint: Secondary | ICD-10-CM

## 2020-05-12 DIAGNOSIS — J45909 Unspecified asthma, uncomplicated: Secondary | ICD-10-CM

## 2020-05-12 DIAGNOSIS — Z8669 Personal history of other diseases of the nervous system and sense organs: Secondary | ICD-10-CM

## 2020-05-12 DIAGNOSIS — J9600 Acute respiratory failure, unspecified whether with hypoxia or hypercapnia: Secondary | ICD-10-CM

## 2020-05-12 DIAGNOSIS — K219 Gastro-esophageal reflux disease without esophagitis: Secondary | ICD-10-CM

## 2020-05-12 DIAGNOSIS — E1165 Type 2 diabetes mellitus with hyperglycemia: Secondary | ICD-10-CM

## 2020-05-12 NOTE — Telephone Encounter (Signed)
Called and spoke to [place].   Made aware that referral for [facility] was placed.   Verbalized understanding- nothing further.     Leonard Schwartz, RN

## 2020-05-12 NOTE — Telephone Encounter (Signed)
Received a note from nurse (thru form) that they are changing pharmacy to [facility], please call patient and confirm that all meds need to go to [facility] and also ask if he is due for any refills at this time (will need to speak with home nurse or Dad)

## 2020-05-13 ENCOUNTER — Telehealth: Payer: Self-pay

## 2020-05-13 ENCOUNTER — Encounter: Payer: Self-pay | Admitting: Pediatrics

## 2020-05-13 DIAGNOSIS — E119 Type 2 diabetes mellitus without complications: Secondary | ICD-10-CM

## 2020-05-13 MED ORDER — SODIUM CHLORIDE 0.9 % IN NEBU *I*
3.0000 mL | INHALATION_SOLUTION | RESPIRATORY_TRACT | 5 refills | Status: DC | PRN
Start: 2020-05-13 — End: 2021-12-07

## 2020-05-13 MED ORDER — INSULIN GLARGINE (2 UNIT DIAL) 300 UNIT/ML SC SOPN *I*
100.0000 [IU] | PEN_INJECTOR | Freq: Every evening | SUBCUTANEOUS | 5 refills | Status: DC
Start: 2020-05-13 — End: 2020-06-23

## 2020-05-13 MED ORDER — ALBUTEROL SULFATE (2.5 MG/3ML) 0.083% IN NEBU *I*
2.5000 mg | INHALATION_SOLUTION | Freq: Two times a day (BID) | RESPIRATORY_TRACT | 3 refills | Status: DC
Start: 2020-05-13 — End: 2020-06-02

## 2020-05-13 MED ORDER — IPRATROPIUM BROMIDE 0.02 % IN SOLN *I*
500.0000 ug | Freq: Two times a day (BID) | RESPIRATORY_TRACT | 5 refills | Status: DC
Start: 2020-05-13 — End: 2020-09-07

## 2020-05-13 MED ORDER — NYSTATIN 100000 UNIT/GM EX POWD  *I*
CUTANEOUS | 5 refills | Status: DC
Start: 2020-05-13 — End: 2021-08-30

## 2020-05-13 MED ORDER — IPRATROPIUM BROMIDE 0.02 % IN SOLN *I*
500.0000 ug | Freq: Four times a day (QID) | RESPIRATORY_TRACT | 5 refills | Status: AC | PRN
Start: 2020-05-13 — End: 2020-06-12

## 2020-05-13 MED ORDER — FLUTICASONE PROPIONATE 50 MCG/ACT NA SUSP *I*
1.0000 | Freq: Every day | NASAL | 5 refills | Status: DC
Start: 2020-05-13 — End: 2021-02-23

## 2020-05-13 MED ORDER — ZINC OXIDE 40 % EX PSTE *I*
PASTE | CUTANEOUS | 1 refills | Status: DC | PRN
Start: 2020-05-13 — End: 2022-06-23

## 2020-05-13 MED ORDER — BUDESONIDE 0.25 MG/2ML IN SUSP *I*
0.2500 mg | Freq: Two times a day (BID) | RESPIRATORY_TRACT | 1 refills | Status: DC
Start: 2020-05-13 — End: 2020-08-02

## 2020-05-13 MED ORDER — CARBAMIDE PEROXIDE 6.5 % OT SOLN *I*
OTIC | 5 refills | Status: DC
Start: 2020-05-13 — End: 2021-07-13

## 2020-05-13 MED ORDER — SENNOSIDES 8.6 MG PO TABS *I*
1.0000 | ORAL_TABLET | Freq: Every day | ORAL | 3 refills | Status: DC
Start: 2020-05-13 — End: 2020-06-22

## 2020-05-13 MED ORDER — NOVOLOG FLEXPEN 100 UNIT/ML SC SOPN
PEN_INJECTOR | SUBCUTANEOUS | 5 refills | Status: DC
Start: 2020-05-13 — End: 2020-06-23

## 2020-05-13 MED ORDER — BACLOFEN 10 MG PO TABS *I*
10.0000 mg | ORAL_TABLET | Freq: Two times a day (BID) | ORAL | 1 refills | Status: DC
Start: 2020-05-13 — End: 2020-07-30

## 2020-05-13 MED ORDER — LACOSAMIDE 10 MG/ML PO SOLN *I*
ORAL | 5 refills | Status: DC
Start: 2020-05-13 — End: 2020-05-14

## 2020-05-13 MED ORDER — VALPROATE SODIUM 250 MG/5ML PO SOLN *I*
ORAL | 1 refills | Status: DC
Start: 2020-05-13 — End: 2020-08-31

## 2020-05-13 MED ORDER — DOCUSATE SODIUM 10 MG/ML PO LIQD *I*
100.0000 mg | Freq: Two times a day (BID) | ORAL | 5 refills | Status: DC
Start: 2020-05-13 — End: 2020-05-31

## 2020-05-13 MED ORDER — LACTOBACILLUS RHAMNOSUS (GG) PO CAPS *I*
1.0000 | ORAL_CAPSULE | Freq: Every day | ORAL | 5 refills | Status: DC
Start: 2020-05-13 — End: 2022-11-15

## 2020-05-13 MED ORDER — FAMOTIDINE 20 MG PO TABS *I*
ORAL_TABLET | ORAL | 2 refills | Status: DC
Start: 2020-05-13 — End: 2020-12-14

## 2020-05-13 MED ORDER — BISACODYL 10 MG RE SUPP *I*
RECTAL | 1 refills | Status: DC
Start: 2020-05-13 — End: 2020-09-23

## 2020-05-13 MED ORDER — IBUPROFEN 600 MG PO TABS *I*
600.0000 mg | ORAL_TABLET | Freq: Four times a day (QID) | ORAL | 0 refills | Status: DC | PRN
Start: 2020-05-13 — End: 2021-01-18

## 2020-05-13 MED ORDER — TRIAMCINOLONE ACETONIDE 0.025 % EX CREA *I*
TOPICAL_CREAM | Freq: Two times a day (BID) | CUTANEOUS | 2 refills | Status: DC
Start: 2020-05-13 — End: 2024-05-30

## 2020-05-13 NOTE — Telephone Encounter (Signed)
Spoke with Angel Wells home RN for pt.   She says that pt has had 3 seizures this week lasting 45 - 60 sec. Was lethargic for most of the day, but now seems at base.    O2 is low 90's, not labored breathing, or SOB. No constipation, no fever   Dad seems to feel like this is his seasonal change and has expressed to nurse that he is not concerned.      Angel Wells says that they will increase Vesting to 4x day, will also use duo neb QID to help with increased secreations.     Will route to PCP as FYI and will keep task in nurse pool to check in next day.

## 2020-05-13 NOTE — Telephone Encounter (Signed)
Attempted to call Dad twice, when number is dialed it does not even ring.  Called nurse listed [name]) and she stated she has been out since June.    Will attempt to call later today 11/4

## 2020-05-13 NOTE — Addendum Note (Signed)
Addended by: Midge Minium on: 05/13/2020 11:37 AM     Modules accepted: Orders

## 2020-05-13 NOTE — Telephone Encounter (Signed)
Spoke to dad- Angel Wells stated EVERYTHING to be transferred over to [facility] pharmacy.    Dad also stated the new nurse is [facility] phone# [phone]

## 2020-05-13 NOTE — Telephone Encounter (Signed)
Spoke to [place].  Stated he is on 68ml  The 64ml every other day was just when he started on that medicine.

## 2020-05-13 NOTE — Addendum Note (Signed)
Addended by: Midge Minium on: 05/13/2020 11:55 AM     Modules accepted: Orders

## 2020-05-13 NOTE — Telephone Encounter (Signed)
Please call Shelly and clarify what dose of lacosamide he is on, we have 56ml BID but we reported 53ml BID last time we saw him.  All other meds I sent to [facility] except for controlled substances which will need to be sent only when due.

## 2020-05-14 ENCOUNTER — Other Ambulatory Visit: Payer: Self-pay | Admitting: Pediatrics

## 2020-05-14 ENCOUNTER — Telehealth: Payer: Self-pay | Admitting: Neurology

## 2020-05-14 DIAGNOSIS — G40219 Localization-related (focal) (partial) symptomatic epilepsy and epileptic syndromes with complex partial seizures, intractable, without status epilepticus: Secondary | ICD-10-CM

## 2020-05-14 MED ORDER — CIPROFLOXACIN HCL 500 MG PO TABS *I*
ORAL_TABLET | ORAL | 0 refills | Status: AC
Start: 2020-05-14 — End: 2020-05-28

## 2020-05-14 MED ORDER — LACOSAMIDE 10 MG/ML PO SOLN *I*
ORAL | 5 refills | Status: DC
Start: 2020-05-14 — End: 2020-11-29

## 2020-05-14 NOTE — Telephone Encounter (Signed)
I spoke to [name].  Angel had three small seizures in the entire month of October and has already had seven seizures in November.  He seems more congested, and they aren't sure if he is getting sick.  His dose of lacosamide was not increased in August as I had prescribed.  I never received an change order form to sign from his nurses, though one was corrected.  His father told me that he thought he was doing pretty well with only three seizures last month.  I explained that as long as he is not experiencing adverse effects, I think it is appropriate to titrate his dose of lacosamide with the goal of getting his seizure frequency as low as possible. A higher dose may also make him less likely to have an increase in seizures if he is sick or experiences some other physiologic stress.  His father agreed to increase his dose now.  I sent a new prescription to his pharmacy for lacosamide solution 10 mg/mL 10 mL per tube twice daily.  I sent a change order to the My Chart account as a letter.

## 2020-05-14 NOTE — Telephone Encounter (Addendum)
Shelly called back to talk to report on today's increased seizure activity and respiratory issues. He is currently on the vent with 3 liters oxygen and sat rate is 88-90. She asked Angel Wells directly if he wanted to go ont he vent and he said yes, which is not like him.  Please call at [phone]

## 2020-05-14 NOTE — Telephone Encounter (Signed)
Reviewed the below with home care nurse.    She mentions that the nurse over the weekend is not as familiar with pt, is also a LPN so she will not be able to take orders from PCP if she needs to reach out over the weekend. Shelly asks if PCP would reconsider having CXR done this day and adding a sputum cx to lab order.     Neurology says they will increase Vimpat

## 2020-05-14 NOTE — Telephone Encounter (Signed)
I will send in ciprofloxacin course (500mg  BID for 14 days), if this does not improve his course then I will send him for cxr.  Please have Rn draw CBC, CMP

## 2020-05-14 NOTE — Telephone Encounter (Signed)
Readdressed Vimpat order with [facility] RN.     She verbalized understanding for lab requests and Abx.     Will postpone task till Monday to check in on pt.

## 2020-05-14 NOTE — Telephone Encounter (Signed)
Patients father is asking to speak with provider about how the patient has had an increase of seizures.    Please advise.  [phone]

## 2020-05-14 NOTE — Telephone Encounter (Signed)
Spoke with Angel Wells she says that [place] had 2 more seizures this day. She says that she has given him his cetirizine, increased vesting, and duo nebs QID and has not seen any improvement.  Pts O2 was lower today on 3Liters 88-90 and pt asked to be placed on his vent.       Home nurse is asking if pcp would be willing to order a chest xray, and  have lab work done. Family refuses to take pt to ED at this time d/t COVID concerns.

## 2020-05-17 NOTE — Telephone Encounter (Signed)
Angel Dire, RN arrived to collect lab tubes for peripheral blood draw.  Given labels and tubes.    Updated that [name] is no longer on [name]'s case load again.  Had injury and will not be returning.  Angel Wells will be main nurse moving forward.  Updated pt contact list.

## 2020-05-18 ENCOUNTER — Other Ambulatory Visit
Admission: RE | Admit: 2020-05-18 | Discharge: 2020-05-18 | Disposition: A | Discharge status: Home / Self Care | Payer: Medicare Other | Source: Ambulatory Visit | Attending: Pediatrics | Admitting: Pediatrics

## 2020-05-18 DIAGNOSIS — Z0189 Encounter for other specified special examinations: Secondary | ICD-10-CM | POA: Insufficient documentation

## 2020-05-18 LAB — CBC
Hematocrit: 37 % — ABNORMAL LOW (ref 40–51)
Hemoglobin: 12.9 g/dL — ABNORMAL LOW (ref 13.7–17.5)
MCH: 30 pg (ref 26–32)
MCHC: 35 g/dL (ref 32–37)
MCV: 85 fL (ref 79–92)
Platelets: 252 10*3/uL (ref 150–330)
RBC: 4.4 MIL/uL — ABNORMAL LOW (ref 4.6–6.1)
RDW: 13.2 % (ref 11.6–14.4)
WBC: 8.5 10*3/uL (ref 4.2–9.1)

## 2020-05-18 LAB — COMPREHENSIVE METABOLIC PANEL
ALT: 41 U/L (ref 0–50)
AST: 19 U/L (ref 0–50)
Albumin: 4.4 g/dL (ref 3.5–5.2)
Alk Phos: 70 U/L (ref 40–130)
Anion Gap: 16 (ref 7–16)
Bilirubin,Total: 0.4 mg/dL (ref 0.0–1.2)
CO2: 23 mmol/L (ref 20–28)
Calcium: 9.5 mg/dL (ref 9.0–10.3)
Chloride: 87 mmol/L — ABNORMAL LOW (ref 96–108)
Creatinine: 0.32 mg/dL — ABNORMAL LOW (ref 0.67–1.17)
GFR,Black: 205 *
GFR,Caucasian: 177 *
Glucose: 192 mg/dL — ABNORMAL HIGH (ref 60–99)
Lab: 4 mg/dL — ABNORMAL LOW (ref 6–20)
Potassium: 3.5 mmol/L (ref 3.3–5.1)
Sodium: 126 mmol/L — ABNORMAL LOW (ref 133–145)
Total Protein: 7.5 g/dL (ref 6.3–7.7)

## 2020-05-19 ENCOUNTER — Telehealth: Payer: Self-pay | Admitting: Pediatrics

## 2020-05-20 ENCOUNTER — Telehealth: Payer: Self-pay | Admitting: Pediatrics

## 2020-05-20 ENCOUNTER — Other Ambulatory Visit: Payer: Self-pay | Admitting: Pediatrics

## 2020-05-20 DIAGNOSIS — E871 Hypo-osmolality and hyponatremia: Secondary | ICD-10-CM

## 2020-05-20 NOTE — Telephone Encounter (Signed)
Attempted to call [name], RN.   No answer- LVM to call clinic back.   Will continue to reach out.     Leonard Schwartz, RN

## 2020-05-20 NOTE — Telephone Encounter (Signed)
Patient Name: Angel Wells   Birth Date: 09-29-1990   Address: [address] [place], [place] [address]   Sex: Male   Rx Written Rx Dispensed Drug Quantity Days Supply Prescriber Name Prescriber Dea # Payment Method Dispenser   01/26/2020 04/25/2020 vimpat 10 mg/ml solution  30 Azucena Kuba (MD) [id number] [facility] [phone]   04/14/2020 04/15/2020 clonazepam 1 mg tablet  10 10 Midge Minium [id number] [id number]   01/26/2020 03/17/2020 vimpat 10 mg/ml solution  37 Azucena Kuba (MD) [id number] [facility] [id number]   01/26/2020 01/29/2020 vimpat 10 mg/ml solution  37 Azucena Kuba (MD) [id number] [facility] [phone]   * - Drugs marked with an asterisk are compound drugs. If the compound drug is made up of more than one controlled substance, then each controlled substance will be a separate row in the table.

## 2020-05-20 NOTE — Telephone Encounter (Signed)
Maryan Rued, RNs called back.     Relayed message and recommendations per PCP regarding patient's hyponatremia and hypochloremia. Per [facility]win Reges will draw patient's blood tomorrow.     Per Maryan Rued- patient is doing ok. He had an increase in seizures last week- had 8 total. Neurology aware. Overall patient is doing ok in general- appetite is fair.     [name] states that patient has been having thick mucus and to help thin it out he has been receiving 2500-3081mL of water flushes daily. Wants PCP to be aware.     Routing to PCP to be aware.     [name], RN is off until Sunday- can call [name] at [phone].     Leonard Schwartz, RN

## 2020-05-20 NOTE — Telephone Encounter (Signed)
His labs show severe hyponatremia, hypochloremia.  Please call nurse Burnett Harry or main nurse) and ask to redraw today or tomorrow if possible.  Also ask how he is doing in general and if his seizures are under control and if he's otherwise eating okay.  Only recent med change was going up on seizure medicine and adding antibiotic but I didn't see that this would cause these effects.  Will ask Sallyanne Havers to weight in as well

## 2020-05-21 ENCOUNTER — Telehealth: Payer: Self-pay | Admitting: Pediatrics

## 2020-05-21 ENCOUNTER — Other Ambulatory Visit
Admission: RE | Admit: 2020-05-21 | Discharge: 2020-05-21 | Disposition: A | Discharge status: Home / Self Care | Payer: Medicare Other | Source: Ambulatory Visit | Attending: Pediatrics | Admitting: Pediatrics

## 2020-05-21 ENCOUNTER — Other Ambulatory Visit: Payer: Self-pay | Admitting: Pediatrics

## 2020-05-21 ENCOUNTER — Encounter: Payer: Self-pay | Admitting: Internal Medicine

## 2020-05-21 ENCOUNTER — Telehealth: Payer: Self-pay | Admitting: Internal Medicine

## 2020-05-21 DIAGNOSIS — J398 Other specified diseases of upper respiratory tract: Secondary | ICD-10-CM

## 2020-05-21 DIAGNOSIS — E1165 Type 2 diabetes mellitus with hyperglycemia: Secondary | ICD-10-CM

## 2020-05-21 DIAGNOSIS — S069XAA Unspecified intracranial injury with loss of consciousness status unknown, initial encounter: Secondary | ICD-10-CM

## 2020-05-21 DIAGNOSIS — E871 Hypo-osmolality and hyponatremia: Secondary | ICD-10-CM

## 2020-05-21 DIAGNOSIS — S069X9A Unspecified intracranial injury with loss of consciousness of unspecified duration, initial encounter: Secondary | ICD-10-CM

## 2020-05-21 DIAGNOSIS — Z794 Long term (current) use of insulin: Secondary | ICD-10-CM

## 2020-05-21 DIAGNOSIS — Z9911 Dependence on respirator [ventilator] status: Secondary | ICD-10-CM

## 2020-05-21 DIAGNOSIS — Z0189 Encounter for other specified special examinations: Secondary | ICD-10-CM | POA: Insufficient documentation

## 2020-05-21 LAB — BASIC METABOLIC PANEL
Anion Gap: 15 (ref 7–16)
CO2: 23 mmol/L (ref 20–28)
Calcium: 9.6 mg/dL (ref 9.0–10.3)
Chloride: 101 mmol/L (ref 96–108)
Creatinine: 0.31 mg/dL — ABNORMAL LOW (ref 0.67–1.17)
GFR,Black: 207 *
GFR,Caucasian: 179 *
Glucose: 186 mg/dL — ABNORMAL HIGH (ref 60–99)
Lab: 4 mg/dL — ABNORMAL LOW (ref 6–20)
Potassium: 4.3 mmol/L (ref 3.3–5.1)
Sodium: 139 mmol/L (ref 133–145)

## 2020-05-21 NOTE — Telephone Encounter (Signed)
Attempted to call Liborio Nixon, patient's mom back on both numbers. No answer. LVM at [phone].     Leonard Schwartz, RN

## 2020-05-21 NOTE — Telephone Encounter (Signed)
[facility]             Telephone Encounter     History   After hours on call     05/21/20  1750:  Home care nurse called looking for results of Emori's lab that was drawn earlier that day. She reports that [place] was having increased oxygen/vent requirements and lungs were sounding very "wet"   She also had reports that he had a signifciant seizure that is unusual for him.  -she also requested IVF to be ordered for them to give d/t hyponatremia     -at this time due to the newly reported respiratory symptoms- I recommended ED for evaluation but the family stated they would not bring him into the ED and this was not an option.   -I also explained that I would not be able to order him IVF tonight or even during the weekend if he has never had them ordered before. Also explained my concerns with doing this as outpatient and not in a closely monitored setting - explained that if his sodium is so low that he needs IVF to replete then he needs to be evaluated at the hospital   -since options were limited - we planned to continue to monitor him with his home nurses and would check in on them later in the evening- hopefully when the lab results came back and we had better answers      05/23/20   9:47 PM  Returned call to nurse to discuss updates. At this time he was remaining stable without anymore seizures and seemed to be otherwise at his baseline- denied lethargy, vitals were stable, BMP results came back and his sodium was WNL  -continue current plan and will have PCP weigh in on Monday. Dicussed if he worsens then to take him to the ED       05/23/20   0930  -called to check in on [place] and see how he did overnight.   Father answered and stated that he had not heard anything overnight from the nurse so that means he is likely doing well without any issues. He was appreciative of the call and will call today if status changes.         05/23/20  9:47 PM    Nurse called with updates on Joey's status  and wanted some clarification on next steps:  Today [place] overall was doing well but has been having elevated BPs all day- they help the salt water and was giving only regular free water as previously.   Tien having increased thirst and asking for drinks all day- at of now he already drank of fluids by mouth in addition to his scheduled water flushes multiple times a day  -last lab recheck was normal   -Rayce acting himself- typicaly grumpy and overall well.   -no other vitals are abnormal  -breathing and respiratory status is great compared to yesterday      -plan:   Continue holding salt water flushes and give regular flushes.  -restrict oral fluids to 1080ml/24 hrs over the weekend since he is also getting a significant amount through his G-tube- discussed that I didn't want his sodium to drop again if he keeps drinking   -plan to check another BMP if possible this weekend- I will place order and home nurse will draw and drop it off at a lab so we have an update on his sodium level for Monday.   Discussed  that as long as he stays stable for now, continue with current plan of close monitoring until PCP can weigh in on Monday     05/22/20   2130:  Received another call from family asking to clarify the fluid restriction orders.     Jodi Geralds, NP

## 2020-05-21 NOTE — Telephone Encounter (Signed)
[facility] TELEPHONE TRIAGE     Reason for call: Liborio Nixon requested to receive clarification the patient is to complete labs. Liborio Nixon states there should have been more than just the BMP that the patient needed to complete.     Name of caller: Liborio Nixon for Angel Wells  Relationship to patient: Mother  Phone: [phone]

## 2020-05-21 NOTE — Telephone Encounter (Signed)
Attempted to call [name], RN per request to remind to have patient get labs done, discuss switching flushes to saline flushes, and confirm lacosamide dose.     No answer- LVM to call clinic back.   Will attempt to reach out again.     Leonard Schwartz, RN

## 2020-05-22 ENCOUNTER — Encounter: Payer: Self-pay | Admitting: Internal Medicine

## 2020-05-24 ENCOUNTER — Ambulatory Visit: Payer: Medicare Other | Admitting: Neurology

## 2020-05-24 ENCOUNTER — Other Ambulatory Visit
Admission: RE | Admit: 2020-05-24 | Discharge: 2020-05-24 | Disposition: A | Discharge status: Home / Self Care | Payer: Medicare Other | Source: Ambulatory Visit | Attending: Pediatrics | Admitting: Pediatrics

## 2020-05-24 DIAGNOSIS — E119 Type 2 diabetes mellitus without complications: Secondary | ICD-10-CM | POA: Insufficient documentation

## 2020-05-24 DIAGNOSIS — G8250 Quadriplegia, unspecified: Secondary | ICD-10-CM | POA: Insufficient documentation

## 2020-05-24 DIAGNOSIS — R0902 Hypoxemia: Secondary | ICD-10-CM | POA: Insufficient documentation

## 2020-05-24 DIAGNOSIS — Z9911 Dependence on respirator [ventilator] status: Secondary | ICD-10-CM | POA: Insufficient documentation

## 2020-05-24 LAB — GRAM STAIN

## 2020-05-24 LAB — BASIC METABOLIC PANEL
Anion Gap: 18 — ABNORMAL HIGH (ref 7–16)
CO2: 24 mmol/L (ref 20–28)
Calcium: 9.8 mg/dL (ref 9.0–10.3)
Chloride: 95 mmol/L — ABNORMAL LOW (ref 96–108)
Creatinine: 0.35 mg/dL — ABNORMAL LOW (ref 0.67–1.17)
GFR,Black: 197 *
GFR,Caucasian: 171 *
Glucose: 197 mg/dL — ABNORMAL HIGH (ref 60–99)
Lab: 7 mg/dL (ref 6–20)
Potassium: 3.4 mmol/L (ref 3.3–5.1)
Sodium: 137 mmol/L (ref 133–145)

## 2020-05-24 LAB — AEROBIC CULTURE

## 2020-05-24 MED ORDER — PREDNISONE 20 MG PO TABS *I*
ORAL_TABLET | ORAL | 0 refills | Status: AC
Start: 2020-05-24 — End: 2020-05-29

## 2020-05-24 NOTE — Addendum Note (Signed)
Addended by: Midge Minium on: 05/24/2020 03:23 PM     Modules accepted: Orders

## 2020-05-24 NOTE — Telephone Encounter (Signed)
Just an Towana Badger is his nurse today and you can call her at [phone].

## 2020-05-24 NOTE — Telephone Encounter (Addendum)
Spoke to [employer] nurse by phone, she reports he is getting 47ml BID of his lacosamide (originally they had been giving him only 16mL).  This started on 11/5 with the appropriate dose.  He is on his "sick" clearance regimen of 4 clearance regimen per day.  She states that he is still quite congested, they have not noticed much improvement even with the ciprofloxacin that was started 10 days ago.  No sick contacts that she is aware of.  They are doing g tube flushes  free water 8 times.  Would recommend giving 50% pedialyte with 50% water rather than giving him straight water.   He is pretty wheezy so will write for 5 day supply of prednisone (sent to [facility])  Would also like him to get a viral culture done for COVID/RSV/Flu.  He is not having fevers, mostly want to know if this is viral in nature given his nonresponsiveness to antibiotics.  Suspect he is having a viral infection that is triggering asthma exacerbation.  Continue with 4 times a day airway clearance with vesting, albuterol and saline  Megan or Shelly will pick up swab to do viral testing.  At end of conversation she also reports that his gtube site is bright red, and is draining more than normal.  Booked with Dr Osborne Oman for Wed pm in person appt

## 2020-05-24 NOTE — Telephone Encounter (Signed)
Katie, nurse for [name] called to let us know that she is dropping off his labs at a [facility] lab in the next hour. She was able to collect a sputum sample this morning that is green in color. She would like to drop that off as well, but there is not an order. Can you add an order for it please?

## 2020-05-24 NOTE — Telephone Encounter (Addendum)
Spoke to [employer], Charity fundraiser.  Relayed labs improved.  Aware of labs and sputum dropped at lab today.  Green sputum even though still on cipro.  Labs pending.  Will await updated lab results.    Requested document signed for neomycin otic solution order from 03/22/20.  Will fax the requested paperwork.

## 2020-05-24 NOTE — Addendum Note (Signed)
Addended by: Midge Minium on: 05/24/2020 04:02 PM     Modules accepted: Orders

## 2020-05-24 NOTE — Telephone Encounter (Signed)
Sputum culture ordered.

## 2020-05-25 ENCOUNTER — Telehealth: Payer: Self-pay

## 2020-05-25 ENCOUNTER — Encounter: Payer: Self-pay | Admitting: Gastroenterology

## 2020-05-25 NOTE — Telephone Encounter (Signed)
Incoming paperwork for [facility] to [facility]  Date Received: 05/25/2020    Type of paperwork (sender:form type):[facility] revision plan of care    Completed paperwork to be sent to:  Fax to [phone]    Special Requests:     Placed in box for: [name]

## 2020-05-26 ENCOUNTER — Ambulatory Visit
Payer: Medicare Other | Attending: Student in an Organized Health Care Education/Training Program | Admitting: Student in an Organized Health Care Education/Training Program

## 2020-05-26 ENCOUNTER — Encounter: Payer: Self-pay | Admitting: Student in an Organized Health Care Education/Training Program

## 2020-05-26 ENCOUNTER — Ambulatory Visit: Payer: Medicare Other | Admitting: Student in an Organized Health Care Education/Training Program

## 2020-05-26 ENCOUNTER — Other Ambulatory Visit
Admission: RE | Admit: 2020-05-26 | Discharge: 2020-05-26 | Disposition: A | Discharge status: Home / Self Care | Payer: Medicare Other | Source: Ambulatory Visit

## 2020-05-26 DIAGNOSIS — Z20828 Contact with and (suspected) exposure to other viral communicable diseases: Secondary | ICD-10-CM | POA: Insufficient documentation

## 2020-05-26 DIAGNOSIS — Z20822 Contact with and (suspected) exposure to covid-19: Secondary | ICD-10-CM | POA: Insufficient documentation

## 2020-05-26 DIAGNOSIS — K9429 Other complications of gastrostomy: Secondary | ICD-10-CM | POA: Insufficient documentation

## 2020-05-26 NOTE — Progress Notes (Signed)
[facility]    TELEMEDICINE VISIT  CHIEF COMPLAINT     No chief complaint on file.      Subjective     TELEMEDICINE CONSENT     Visit being conducted by Video in lieu of a face to face visit to minimize health care worker and patient exposure to COVID-19, and conserve PPE.    Location of Patient: home  Location of Telemedicine Provider: hospital / clinical location  Other Participants in telemedicine encounter and roles: dad (hisotian)    Consent was obtained from the patient to complete this telemedicine visit; including the potential for financial liability.  How did the patient provide consent? Form Completed    SUBJECTIVE     This is a 29 y.o. M who has a history of TBI,focal epilepsy, spastic quadriplegia,  T2DM with hyperglycemia, history of pseudomonas pneumonia. Patient recently began having respiratory sx including coughing, and wheezing. Patient was sent for 5 days of prednisone and viral nasal testing was also ordered (for covid, RSV, influA/B). Viral nasal swab planned to be performed by home nursing. Patient was also recommended to increase frequency of vesting, albuterol and HTS to 4 x per day. Patient was also started on ciprofloxacin for possible pseudomonal PNA During the phone call with Dr Shanda Howells, caregivers also noted of concerns with redness around the G tube site.  Visit for today was initially scheduled for in person, however was unaware of the visit and had to be switched to a video visit due to transportation issues.    Respiratory Sx since Call:  Covid, RSV, influenza A/P results pending.  Patient is able to cough up mucus after increasing frequency of testing, albuterol, hypertonic saline to 4 times a day.  Currently has completed 10 out of 14 days of ciprofloxacin today.  No setting in the low 90s (92 to 93%).  Dad thinks the desaturation episodes are due to position as the position.  Denies any other episodes of fevers.  Tolerating his feeds well. Tolerating  ciprofloxacin fine. No increased frequency of seizures or diarrhea.    GI site:  2 days ago, home nursing had noticed redness surrounding the G-tube site.  Patient has had similar episodes in the past, and is now concerned that.  States erythema has very mild surrounding the G-tube site.  Denies any purulent/thick discharge from the area.  Bleeding.  G-tube has been functioning normally.  No episodes of nausea or vomiting.  Stools are at baseline (off).  That has been using zinc oxide ointment for the last day.  Home nurse, erythema looks better today than it did yesterday.  Denies any fevers or chills.    MEDICATIONS     Current Outpatient Medications   Medication Sig    predniSONE (DELTASONE) 20 mg tablet TAKE 2 TABS DAILY    Accu-Chek Softclix Lancets (ACCU-CHEK SOFTCLIX) USE AS DIRECTED    EPINEPHrine (EPIPEN) 0.3 mg/0.3 mL auto-injector USE AS DIRECTED    clonazePAM (KLONOPIN) 1 mg tablet TAKE 1 TABLET BY MOUTH TWICE DAILY AS NEEDED (crush for g-tube)    ciprofloxacin (CIPRO) 500 mg tablet TAKE 1 TAB TWICE DAILY    lacosamide (VIMPAT) 10 mg/mL SOLN oral solution 10 mL per tube twice daily.    albuterol (PROVENTIL) (2.5 mg/53m) 0.083% nebulizer solution Inhale 3 mLs (2.5 mg total) by nebulization 2 times daily  Give twice daily with vesting    baclofen (LIORESAL) 10 mg tablet Take 1 tablet (10 mg total) by mouth 2  times daily    bisacodyl ([facility] BISACODYL) 10 mg suppository PLEASE GIVE SUPPOSITORY AS NEEDED IF NO BM IN 3 DAYS, PLEASE CHECK WITH DAD PRIOR TO GIVING    budesonide (PULMICORT) 0.25 mg/8mL nebulizer solution Inhale 2 mLs (0.25 mg total) by nebulization 2 times daily  for Asthma Dx: J45.909    carbamide peroxide (DEBROX) 6.5 % otic solution Use 5 drops twice daily on the first 3 days of each month.    docusate sodium (COLACE) 10 mg/mL liquid Take 10 mLs (100 mg total) by mouth 2 times daily    famotidine (PEPCID) 20 mg tablet TAKE 1 TABLET BY MOUTH 2 (TWO) TIMES DAILY. OK TO CRUSH IT AND  PUT IT DOWN TUBE IF NECESSARY    fluticasone (FLONASE) 50 MCG/ACT nasal spray Spray 1 spray into nostril daily    ibuprofen (ADVIL,MOTRIN) 600 mg tablet Take 1 tablet (600 mg total) by mouth 4 times daily as needed for Pain for up to 30 doses    insulin aspart (NOVOLOG FLEXPEN) 100 UNIT/ML injection pen for Type 2 Diabetes Inject SQ before meals: Nutritional BL: 10 units + SS as follows: <70: hold BL, 70-200: BL, > 200: BL+2, >250: BL+4, >300: BL+6, >350: BL +8. Alert MD for BG <70 and >350.    Insulin Glargine, 2 Unit Dial, 300 UNIT/ML injection pen Inject 100 units into the skin nightly  Discard each pen 56 days after first use.    ipratropium (ATROVENT) 0.02 % nebulizer solution Inhale 2.5 mLs (500 mcg total) by nebulization 2 times daily  for Asthma Dx: J45.4    ipratropium (ATROVENT) 0.02 % nebulizer solution Inhale 2.5 mLs (500 mcg total) by nebulization 4 times daily as needed for Wheezing  J45.4    lactobacillus rhamnosus, GG, (CULTURELLE) capsule Take 1 capsule (1 each total) by mouth daily    nystatin (NYSTATIN) powder Apply topically 4 times daily  to the following areas: neck, groin, axilla    senna (SENOKOT) 8.6 mg tablet Administer 1 tablet via G tube daily  Crush 1 tablet via G-tube daily.  Hold for loose stools.    sodium chloride 0.9 % nebulizer solution Inhale 3 mLs by nebulization as needed for Wheezing  MCAID ID [id number]    triamcinolone (KENALOG) 0.025 % cream Apply topically 2 times daily  APPLY TO AFFECTED AREA    valproate (VALPROIC ACID) 50 mg/mL syrup TAKE 5 MLS (250 MG TOTAL) BY MOUTH 3 TIMES DAILY    zinc oxide (DESITIN) 40 % paste Apply topically as needed for Diaper Rash    ipratropium (ATROVENT) 0.02 % nebulizer solution INHALE 2.5 MLS (500 MCG TOTAL) BY NEBULIZATION 2 TIMES DAILY DX: J45.4    Misc. Devices (PULSE OXIMETER) MISC Masimo Oximeter with Supplies, LON: 99, [id number], J96.11, Z99.11    generic DME TRACH TIES WITH O2 PORT. Use as directed.MCAID ID [id number]     MIC-KEY gastrostomy feeding tube extension set 12" Use as directed with gtube    generic DME Nonin Pulse ox sensors (disposable)Use as directed.MCAID ID [id number], LON 99    generic DME Pulse ox probe replacement tape. Use as directed.[id number], LON 99    suction machine Instructions for use: Use as needed for secretions, LON: 99 months, [id number]    generic DME Adult size trach ties Use as directed.    B-D UF III MINI PEN NEEDLES 31G X 5 MM USE 3 (THREE) TIMES DAILY AS NEEDED.    ACCU-CHEK AVIVA PLUS test strip USE 1 STRIP 3 (THREE) TIMES  DAILY AND AS NEEDED.DX: E11.65 AND Z79.4    generic DME Butterfly Harness for use in wheelchair, dispense 1. Use as directed. Dx G80.0, S06.9x9, LON: lifetime    sennosides (SENOKOT) 8.8 mg/47m syrup Take 10 mLs by mouth nightly HOLD for diarrhea    MIC-KEY gastrostomy feeding tube extension set 24" Use as directed, please dispense 4 per month    Syringe Disposable 60 ML MISC Use as directed    MIC-KEY gastrostomy kit 14FR Please dispense every 3 months and prn, MicKey 14Fr 2.5cm    gauze pads 2"X2" Use as directed around trach. MCAID ID [id number]   generic DME Adhesive tape 1" x 5 yards. Use as directed. Disp 3 box    Nebulizers (PARI LC PLUS VIOS PRO NEB) MISC Pari Vios Pro Neb or comparable with Nebulizer kits and nebulizer trach mask. Use twice daily With albuterol.MCAID # [id number] MCARE #[id number]   tobramycin, PF, (TOBI) 300 MG/5ML nebulizer solution NEBULIZE 1 VIAL (300 MG) TWICE DAILY EVERY OTHER MONTH   DIAGNOSIS: E84.0    generic DME 160mAdapter for trach to be used as directed. Replace weekly. Dx.Z99.11    generic DME 15 mm adapters for nebulized therapies via trach  Medicare ID [id number] ID [id number]  nebulizer device with mask and tubing Use as directed    generic DME Please dispense one 5068myringe. Use as directed.    generic DME MCAID ID [id number] CUFFED TRACH 7.0 Please change trach every month and as needed.    generic  DME Please dispense 16m40masuring cup. Use as directed.    Gauze Pads & Dressings (SPLIT GAUZE DRAINAGE SPONGE) 4"X4" Use once and PRN times a day as instructed.    generic DME 10 isothermal adult "antiflect" connector. MCAID [id number] as directed.    Nebulizer MISC Disposable nebulizer kits to be used inline with trach  MCAID ID [id number] DME Suction catheter with chimney valve.Use as directed.MCAID ID [id number] DME Non-sterile gloves size medium Use as directed. MCAID ID [id number]/month    gauze sponge (CURITY) 4"X4" pads Use PRN as instructed.MCAID ID [id number] DME PASSEY MUIR VALVE (PURPLE) Use as tolerated.MCAID ID [id number] Tape 1"x5yd TAPE By 1 Units no specified route as needed MCAID ID [id number] DME 10 ml syringes Use as directed.MCAID ID [id number] DME Trach care kits Use as directed.MCAID ID [id number] lubricant (SURGILUBE) gel Apply topically as needed MCAID ID [id number] Water LIQD Distilled water, use as directedMCAID ID [id number] DME Mepilex 4X 4. Use as directed.MCAID ID [id number] DME HME for trach humidification Use as directed.MCAID ID [id number] DME Cough Assist circuits Use as directed.MCAID ID [id number] DME Suction tubing Use as directed.MCAID ID [id number] DME Suction canister Use as directed.MCAID ID [id number] DME Suction filter Use as directed.MCAID ID [id number] DME Nasal adapters Use as directed.MCAID ID [id number] (BENADRYL) 25 MG oral solid 25 mg by Per G Tube route nightly as needed for Sleep (Patient not taking: Reported on 02/19/2020)    FIBER SELECT GUMMIES PO Take by mouth Give 2 gummies daily    Multiple Vitamins-Minerals (HM MULTIVITAMIN ADULT GUMMY PO) Take by mouth  Give 2 gummies daily    Ascorbic Acid (VITAMIN C ADULT GUMMIES PO) Take by mouth Give 2 gummies once daily     Cholecalciferol (VITAMIN D3) 50 MCG (2000 UT) capsule 2,000 Units by Per G Tube route daily    cetirizine (ZYRTEC) 1 MG/ML syrup 10 mg by Per G Tube route at bedtime    oxygen 2 l/m bleed through ventilator at night    Blood Glucose Monitoring Suppl (FIFTY50 GLUCOSE METER 2.0) w/Device KIT by Other route    lactulose (LACTULOSE) 20 GM/30ML solution 10 g by Per G Tube route 3 times daily Hold dose for loose stools    levETIRAcetam (KEPPRA) 100 MG/ML solution GIVE 15 MLS BY PER G TUBE ROUTE 2 (TWO) TIMES DAILY.    miconazole (MICATIN) 2 % powder 2 times daily as needed     insulin pen needle (FIFTY50 PEN NEEDLES) 31G X 8 MM 1 each     Medications reviewed at today's visit     Objective     OBJECTIVE   PHYSICAL EXAM  This visit was performed during a pandemic event, and the physical exam was limited to my Video observation of this patient's organ systems and/or body areas.   GEN:  Alert, awake  Chest: No accessory muscle use  Abd: mild erythema surrounding the G tube site. No discharge    ASSESSMENT   Today we discussed the assessment and management of the following diagnosis:    ICD-10-CM ICD-9-CM   1. Irritation around percutaneous endoscopic gastrostomy (PEG) tube site  K94.29 536.49   Based on video visit today, patient likely has irritation surrounding G tube site. Other possible etiologies include cellulitis, abscess. Based on clinically evaluation today, it is less likely that either is going on, but will have low threshold to treat if not improving    PLAN      1. Irritation around percutaneous endoscopic gastrostomy (PEG) tube site  - continue to use zinc oxide ointment daily as dad/ hoe nursing has already been doing (per dad this has been improving the redness)  - if redness worsens or begins to develop purulent discharge, patient to be seen in clinic  - If redness fails to improve or if worsens, I would be concencered about possible MRSA infection vs an abscess that needs drainage. Either way,  patient would need to be seen in clinic. I mentioned to dad that if clinically worsens, patient would need U/S, dad was amenable.  - given that transportation needs to be set up in advanced, patient would benefit from being seen in person next week to ensure the redness is improving. Recommended dad to schedule a follow up appnt on 11/22.  - route to care manager to assist with transportation (In the past home nurses have been calling to set up transportation to and from clinic visits, however given that many of [facility] home nurses are new this is not happening consistently per dad.      Orders Placed This Encounter   No orders placed during this encounter.       There are no Patient Instructions on file for this visit.     Time:  Over 30 minutes were spent with the patient, >50% of the time was spent in counseling regarding the above problems.      21+  minutes was spent on the phone with the patient, patient representatives, and/or other attendees.     Return in about 5 days (around 05/31/2020) for Follow-up of redness surrounidng  G-tube .      Michail Jewels, MD

## 2020-05-26 NOTE — Progress Notes (Deleted)
[place]    Office Visit Note     REASON FOR VISIT      No chief complaint on file.  Redness around the G tube site      [place]      Shahzaib was accompanied by {patient/parent/guardian/caregiver:[id number]} who provided {DESC; MOST / CHE:52778} of subjective history     This is a 29 y.o. M who has a history of TBI,focal epilepsy, spastic quadriplegia,  T2DM with hyperglycemia, history of pseudomonas pneumonia. Patient recently began having respiratory sx including coughing, and wheezing. Patient was sent for 5 days of prednisone and viral nasal testing was also ordered (for covid, RSV, influA/B). Viral nasal swab planned to be performed by home nursing. Patient was also recommended to increase frequency of vesting, albuterol and HTS to 4 x per day. Patient was also started on ciprofloxacin for possible pseudomonal PNA During the phone call, caregivers also noted of concerns with redness around the G tube site.    Respiratory Sx since Call:      GI site:        ***        ROS  Review of Systems as per HPI above    Past Medical History, Social History, Family History, and Medications/allergies reviewed during this visit    Current Outpatient Medications   Medication    predniSONE (DELTASONE) 20 mg tablet    Accu-Chek Softclix Lancets (ACCU-CHEK SOFTCLIX)    EPINEPHrine (EPIPEN) 0.3 mg/0.3 mL auto-injector    clonazePAM (KLONOPIN) 1 mg tablet    ciprofloxacin (CIPRO) 500 mg tablet    lacosamide (VIMPAT) 10 mg/mL SOLN oral solution    albuterol (PROVENTIL) (2.5 mg/87m) 0.083% nebulizer solution    baclofen (LIORESAL) 10 mg tablet    bisacodyl ([facility] BISACODYL) 10 mg suppository    budesonide (PULMICORT) 0.25 mg/274mnebulizer solution    carbamide peroxide (DEBROX) 6.5 % otic solution    docusate sodium (COLACE) 10 mg/mL liquid    famotidine (PEPCID) 20 mg tablet    fluticasone (FLONASE) 50 MCG/ACT nasal spray    ibuprofen (ADVIL,MOTRIN) 600  mg tablet    insulin aspart (NOVOLOG FLEXPEN) 100 UNIT/ML injection pen    Insulin Glargine, 2 Unit Dial, 300 UNIT/ML injection pen    ipratropium (ATROVENT) 0.02 % nebulizer solution    ipratropium (ATROVENT) 0.02 % nebulizer solution    lactobacillus rhamnosus, GG, (CULTURELLE) capsule    nystatin (NYSTATIN) powder    senna (SENOKOT) 8.6 mg tablet    sodium chloride 0.9 % nebulizer solution    triamcinolone (KENALOG) 0.025 % cream    valproate (VALPROIC ACID) 50 mg/mL syrup    zinc oxide (DESITIN) 40 % paste    ipratropium (ATROVENT) 0.02 % nebulizer solution    Misc. Devices (PULSE OXIMETER) MISC    generic DME    MIC-KEY gastrostomy feeding tube extension set 12"    generic DME    generic DME    suction machine    generic DME    B-D UF III MINI PEN NEEDLES 31G X 5 MM    ACCU-CHEK AVIVA PLUS test strip    generic DME    sennosides (SENOKOT) 8.8 mg/33m26myrup    MIC-KEY gastrostomy feeding tube extension set 24"    Syringe Disposable 60 ML MISC    MIC-KEY gastrostomy kit 14FR    gauze pads 2"X2"    generic DME  Nebulizers (PARI LC PLUS VIOS PRO NEB) MISC    tobramycin, PF, (TOBI) 300 MG/5ML nebulizer solution    generic DME    generic DME    nebulizer device with mask and tubing    generic DME    generic DME    generic DME    Gauze Pads & Dressings (SPLIT GAUZE DRAINAGE SPONGE) 4"X4"    generic DME    Nebulizer MISC    generic DME    generic DME    gauze sponge (CURITY) 4"X4" pads    generic DME    Adhesive Tape 1"x5yd TAPE    generic DME    generic DME    surgical lubricant (SURGILUBE) gel    Distilled Water LIQD    generic DME    generic DME    generic DME    generic DME    generic DME    generic DME    generic DME    diphenhydrAMINE (BENADRYL) 25 MG oral solid    FIBER SELECT GUMMIES PO    Multiple Vitamins-Minerals (HM MULTIVITAMIN ADULT GUMMY PO)    Ascorbic Acid (VITAMIN C ADULT GUMMIES PO)    Cholecalciferol (VITAMIN D3) 50 MCG (2000 UT) capsule     cetirizine (ZYRTEC) 1 MG/ML syrup    oxygen    Blood Glucose Monitoring Suppl (FIFTY50 GLUCOSE METER 2.0) w/Device KIT    lactulose (LACTULOSE) 20 GM/30ML solution    levETIRAcetam (KEPPRA) 100 MG/ML solution    miconazole (MICATIN) 2 % powder    insulin pen needle (FIFTY50 PEN NEEDLES) 31G X 8 MM     No current facility-administered medications for this visit.          Objective     OBJECTIVE      There were no vitals taken for this visit.    Physical Exam         ASSESSMENT / DIAGNOSIS         There are no diagnoses linked to this encounter.    Orders Placed This Encounter   No orders placed during this encounter.     There are no Patient Instructions on file for this visit.       --Patient instructed to call if symptoms are not improving or worsening  --Follow-up arranged  No follow-ups on file.     {Time Based:  I personally spent {time based:35511} minutes on the calendar day of the encounter, including pre and post visit work reviewing the EMR and management of this patient. }    Electronically signed by Michail Jewels, MD , 05/26/2020 @   [place], Phone: [phone]

## 2020-05-27 LAB — INFLUENZA B PCR: Influenza B PCR: 0

## 2020-05-27 LAB — COVID-19 PCR

## 2020-05-27 LAB — RSV PCR: RSV PCR: 0

## 2020-05-27 LAB — INFLUENZA A: Influenza A PCR: 0

## 2020-05-27 LAB — COVID-19 NAAT (PCR): COVID-19 NAAT (PCR): NEGATIVE

## 2020-05-27 NOTE — Telephone Encounter (Signed)
Paperwork completed 05/27/2020  Sent as outlined in prior documentation

## 2020-05-28 ENCOUNTER — Telehealth: Payer: Self-pay | Admitting: Pediatrics

## 2020-05-28 NOTE — Telephone Encounter (Signed)
Please call [facility] RN and let them know his viral swab was negative for COVID, RSV, flu

## 2020-05-31 MED ORDER — DOCUSATE SODIUM 10 MG/ML PO LIQD *I*
100.0000 mg | Freq: Three times a day (TID) | ORAL | 5 refills | Status: DC
Start: 2020-05-31 — End: 2020-10-25

## 2020-05-31 NOTE — Telephone Encounter (Signed)
Sent updated rx to pharmacy for docusate sodium, clonazepam rx is given to last for 6 months

## 2020-05-31 NOTE — Telephone Encounter (Signed)
Pended DOK

## 2020-05-31 NOTE — Telephone Encounter (Signed)
Called Nurse [name].  Very excited for the negative swabs.  [name] stated that Angel Wells is NOT receiving enough DOK sodium. Stated he recieves 3 doses a day.  [name] also stated that they are receiving TOO MUCH klonopin. Stated they have 30+ but only maybe use a handful a year.    Angel Wells

## 2020-05-31 NOTE — Addendum Note (Signed)
Addended by: Avelina Laine on: 05/31/2020 01:55 PM     Modules accepted: Orders

## 2020-06-01 ENCOUNTER — Other Ambulatory Visit: Payer: Self-pay | Admitting: Pediatrics

## 2020-06-01 DIAGNOSIS — J45909 Unspecified asthma, uncomplicated: Secondary | ICD-10-CM

## 2020-06-01 LAB — LEGIONELLA CULTURE: Legionella Culture: 0

## 2020-06-04 NOTE — Telephone Encounter (Signed)
error 

## 2020-06-08 ENCOUNTER — Other Ambulatory Visit: Payer: Self-pay | Admitting: Pediatrics

## 2020-06-08 DIAGNOSIS — J45909 Unspecified asthma, uncomplicated: Secondary | ICD-10-CM

## 2020-06-08 MED ORDER — ALBUTEROL SULFATE (2.5 MG/3ML) 0.083% IN NEBU *I*
2.5000 mg | INHALATION_SOLUTION | Freq: Two times a day (BID) | RESPIRATORY_TRACT | 3 refills | Status: DC
Start: 2020-06-08 — End: 2020-06-09

## 2020-06-09 ENCOUNTER — Telehealth: Payer: Self-pay | Admitting: Pediatrics

## 2020-06-09 DIAGNOSIS — E1165 Type 2 diabetes mellitus with hyperglycemia: Secondary | ICD-10-CM

## 2020-06-09 DIAGNOSIS — Z794 Long term (current) use of insulin: Secondary | ICD-10-CM

## 2020-06-09 DIAGNOSIS — J45909 Unspecified asthma, uncomplicated: Secondary | ICD-10-CM

## 2020-06-09 MED ORDER — ALBUTEROL SULFATE (2.5 MG/3ML) 0.083% IN NEBU *I*
2.5000 mg | INHALATION_SOLUTION | Freq: Two times a day (BID) | RESPIRATORY_TRACT | 3 refills | Status: DC
Start: 2020-06-09 — End: 2020-09-07

## 2020-06-09 MED ORDER — ACCU-CHEK SOFTCLIX LANCETS MISC *A*
5 refills | Status: DC
Start: 2020-06-09 — End: 2020-06-14

## 2020-06-09 NOTE — Telephone Encounter (Signed)
Once patient is better (he has completed his antibiotic course)( he should not be on QID albuterol and they need to resume BID dosing.   Also i'm not sure that this solution will be covered but we can certainly try

## 2020-06-09 NOTE — Telephone Encounter (Signed)
[name] says that they need more albuterol Says that he has been getting tx QID PRN.  order was placed yesterday. [name] says that [facility] [place],    [facility] as well d/t [facility] no longer taking pt insurance.     Have been using an electrolyte solution. [name] feels this has been helpful in keeping pts sodium levels WNL's. This is mostly used when pts secretions begin to thicken. Father does not want to continue to pay out of pocket for this. [name] would like to know is PCP would be willing to write script for this in order for it to be covered. [name] will call back with name of electrolyte solution.   Gatorate too much sugar so makes BG go up  [name] would like to know if Draw BMP,

## 2020-06-09 NOTE — Telephone Encounter (Signed)
[facility] TELEPHONE TRIAGE     Reason for call: [name], RN called reporting that she has some questions about patient's blood draw. She would like to discuss sodium blood draw and that patient's father would like to stop electrolyte solution. [name] is requesting a call back soon.    Name of caller:Kate for Angel Wells  Relationship to patient: RN  Phone:  [phone]

## 2020-06-11 ENCOUNTER — Telehealth: Payer: Self-pay | Admitting: Pediatrics

## 2020-06-11 DIAGNOSIS — Z794 Long term (current) use of insulin: Secondary | ICD-10-CM

## 2020-06-11 DIAGNOSIS — E1165 Type 2 diabetes mellitus with hyperglycemia: Secondary | ICD-10-CM

## 2020-06-11 NOTE — Telephone Encounter (Signed)
[facility] TELEPHONE INTAKE    Reason for call:  [facility] and said [facility] need specific  Amount of times the Accu-Chek Softclix Lancets (ACCU-CHEK SOFTCLIX) (Order #[id number]) on 06/09/20  Needs to be checked. Please have doctor change the instructions to the specific amount and resend script.

## 2020-06-14 MED ORDER — ACCU-CHEK SOFTCLIX LANCETS MISC *A*
5 refills | Status: DC
Start: 2020-06-14 — End: 2020-06-14

## 2020-06-14 MED ORDER — ACCU-CHEK SOFTCLIX LANCETS MISC *A*
5 refills | Status: DC
Start: 2020-06-14 — End: 2020-08-11

## 2020-06-14 NOTE — Addendum Note (Signed)
Addended by: Katha Cabal on: 06/14/2020 04:57 PM     Modules accepted: Orders

## 2020-06-14 NOTE — Telephone Encounter (Signed)
Done

## 2020-06-15 ENCOUNTER — Telehealth: Payer: Self-pay | Admitting: Neurology

## 2020-06-15 ENCOUNTER — Ambulatory Visit: Payer: Medicare Other | Attending: Neurology | Admitting: Neurology

## 2020-06-15 ENCOUNTER — Ambulatory Visit: Payer: Medicare Other | Attending: Pediatrics | Admitting: Rehabilitative and Restorative Service Providers"

## 2020-06-15 DIAGNOSIS — G40219 Localization-related (focal) (partial) symptomatic epilepsy and epileptic syndromes with complex partial seizures, intractable, without status epilepticus: Secondary | ICD-10-CM

## 2020-06-15 DIAGNOSIS — G8250 Quadriplegia, unspecified: Secondary | ICD-10-CM | POA: Insufficient documentation

## 2020-06-15 DIAGNOSIS — Z9911 Dependence on respirator [ventilator] status: Secondary | ICD-10-CM

## 2020-06-15 NOTE — Telephone Encounter (Signed)
I received a prior authorization request for lacosamide through [email or web address].  I completed the online documentation and submitted electronically.  I printed a copy to be sent to scanning.

## 2020-06-15 NOTE — Progress Notes (Signed)
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  Angel Wells, one of his caregivers, accompanied him today. I last saw him on 11/20/2019.  I have been in contact with his father and his caregivers multiple times since then to make changes in his medication regimen.  I transcribed the history of present illness below during my discussion with Angel Wells and Angel Wells, though it is not written verbatim.    HISTORY OF PRESENT ILLNESS:  He was just at orthotics to be fitted for a new hand brace.  They have not noticed any problems with the increased dose of lacosamide.  He has not had any seizures so far in December.  His last seizure was 05/24/2020, and he had three seizures on 05/21/2020.  He was sick at that time and ended up being treated with antibiotics for pneumonia.  From time to time he has conversations with people who aren't there, or he starts giggling and whispering to himself.  Angel Wells has known him for a year, and those behaviors have been going on for at least that long.     EXAMINATION:  No formal examination was performed today.  Angel Wells was awake and alert.  He was pleasant and seemed relaxed.  He responded to my questions appropriately.  He did not display behaviors suggestive of psychosis during the encounter.      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  The semiology of his seizures suggests that secondary generalization is common.  His seizure frequently is significantly diminished with the addition of lacosamide to his medication regimen.  He had seizures on two days in the setting of pneumonia after we increased his  lacosamide dose, otherwise he has not had a seizure in more than three weeks.  When I first met him he was having seizures multiple days per week, often multiple seizures in one day.  He is tolerating the current regimen well.  We will continue to monitor his seizure frequency and consider increasing his dose of lacosamide if he has recurrent seizures.     PLAN:  · He will continue lacosamide 10 mg/mL 10 mL per tube twice daily, levetiracetam 100 mg/mL 15 mL per tube twice daily, and valproate 50 mg/mL 5 mL per tube three times daily.    · I will see him back in six months. I would be happy to see him sooner if needed.     Anthony M. Maroldo, MD  Assoc. Professor of Clinical Neurology  Pager: [phone]  Office phone: [phone]  Office fax: [phone]  06/15/2020 3:25 PM
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
## 2020-06-16 ENCOUNTER — Encounter: Payer: Self-pay | Admitting: Pediatrics

## 2020-06-16 ENCOUNTER — Telehealth: Payer: Self-pay | Admitting: Pediatrics

## 2020-06-16 DIAGNOSIS — Z794 Long term (current) use of insulin: Secondary | ICD-10-CM

## 2020-06-16 DIAGNOSIS — E1165 Type 2 diabetes mellitus with hyperglycemia: Secondary | ICD-10-CM

## 2020-06-16 NOTE — Telephone Encounter (Signed)
[facility] TELEPHONE INTAKE    Reason for call: Aleen Campi nurse called and asked if amanda ramos could call her back. It's in reguards to receiving the elctro light solution text on the nurse phone.    Name of caller:Kate  Relationship to patient: Nurse  Organization (if applicable):   Phone:  [phone]  Fax (if applicable):

## 2020-06-16 NOTE — Telephone Encounter (Signed)
Error

## 2020-06-16 NOTE — Telephone Encounter (Signed)
[facility]  Prior Authorization Request    Date of request: 06/16/2020  Time of call: 10:36 AM    Medication Requiring PA: Accu-Chek Softclix Lancets   Directions(dosing and frequency): USE AS DIRECTED TO CHECK BG UP TO 6 TIMES DAILY  Quantity/ Day Supply: 100/30  Dx code: E11.65, Z79.4    Request received from:[facility] PA Source: Pharmacy  Name and Phone Number of Pharmacy:   [facility] [phone]   [phone]    Additional Pharmacy Benefit plan: no  Insurance coverage confirmed: yes  Covered Alternative Medication: N/A    Tax ID: [id number]  Provider and NPI: Lie- [id number]     Please provide justification for PA submission & urgency or indicate which covered alternative was ordered.

## 2020-06-17 NOTE — Telephone Encounter (Signed)
Angel Wells wanted to give the name of the electrolyte supplement that pt has been using when he has increased thickened screations. Its "Key Nutrients Electrolyte recovery plus"      In task from 12/1 she asked if PCP would be willing to write note to have powder approved.   Angel Wells mentions that pt father would like to discuss if this is a benefit to patient, he would like to discuss this at nect visit with PCP.     Angel Wells also mentions she will be off till Friday 17th  Bonnita Levan can be will be her supervisor if needed.

## 2020-06-22 ENCOUNTER — Encounter: Payer: Self-pay | Admitting: Pediatrics

## 2020-06-22 ENCOUNTER — Ambulatory Visit: Payer: Medicare Other | Attending: Pediatrics | Admitting: Pediatrics

## 2020-06-22 ENCOUNTER — Telehealth: Payer: Self-pay | Admitting: Pediatrics

## 2020-06-22 VITALS — BP 130/76 | HR 89 | Temp 98.0°F | Wt 179.6 lb

## 2020-06-22 DIAGNOSIS — J9611 Chronic respiratory failure with hypoxia: Secondary | ICD-10-CM

## 2020-06-22 DIAGNOSIS — Z794 Long term (current) use of insulin: Secondary | ICD-10-CM | POA: Insufficient documentation

## 2020-06-22 DIAGNOSIS — Z9911 Dependence on respirator [ventilator] status: Secondary | ICD-10-CM

## 2020-06-22 DIAGNOSIS — E1165 Type 2 diabetes mellitus with hyperglycemia: Secondary | ICD-10-CM

## 2020-06-22 DIAGNOSIS — Z9189 Other specified personal risk factors, not elsewhere classified: Secondary | ICD-10-CM

## 2020-06-22 DIAGNOSIS — S069XAA Unspecified intracranial injury with loss of consciousness status unknown, initial encounter: Secondary | ICD-10-CM

## 2020-06-22 DIAGNOSIS — S069X9A Unspecified intracranial injury with loss of consciousness of unspecified duration, initial encounter: Secondary | ICD-10-CM | POA: Insufficient documentation

## 2020-06-22 LAB — POCT GLUCOSE: Glucose POCT: 248 mg/dL — ABNORMAL HIGH (ref 60–99)

## 2020-06-22 MED ORDER — MIC-KEY GASTROSTOMY KIT 14FR MISC *A*
3 refills | Status: DC
Start: 2020-06-22 — End: 2023-11-28

## 2020-06-22 MED ORDER — SENNOSIDES 8.6 MG PO TABS *I*
2.0000 | ORAL_TABLET | Freq: Every day | ORAL | 3 refills | Status: DC
Start: 2020-06-22 — End: 2021-01-03

## 2020-06-22 MED ORDER — SODIUM CHLORIDE 0.9 % INJ (FLUSH) WRAPPED *I*
5 refills | Status: AC
Start: 2020-06-22 — End: 2020-06-23

## 2020-06-22 NOTE — Progress Notes (Signed)
[facility]    Office Visit Note     REASON FOR VISIT      Chief Complaint   Patient presents with    Follow-up    Diabetes    Seizures         PRIMARY DIAGNOSIS      TBI, DM2    Subjective     SUBJECTIVE      Angel Wells was accompanied by caregiver Cassandra PDN who provided all of subjective history   Breakfast - instant oatmeal, apple cinnamon  Lunch - (given by PDN) Veggie burger, chicken nuggets  Dinner - Dad gives this to him: occas pizza, cheese burger, mashed sweet potatoes, mixed veggies, apples  Night time snack - cheese stick     BG log shows AM sugars all above 200, one reading less than 200  Most meal time sugars are also in the 200s.       Weight today was 179.6lb with hoyer harness    PDn also reports they are using a specific ointment/lotion around gtube but she thinks this may be making the redness worse?  They will need an order for this    Also using electrolytes solution per gtube when he needs fluids, initially this had some sugar in it but now they report this is sugar free, did review that it may still cause his sugars to go up if artificial sugars are used.  ROS  Review of Systems as per HPI above    Past Medical History, Social History, Family History, and Medications/allergies reviewed during this visit    Current Outpatient Medications   Medication    MIC-KEY gastrostomy kit 14FR    senna (SENOKOT) 8.6 mg tablet    Accu-Chek Softclix Lancets (ACCU-CHEK SOFTCLIX)    albuterol (PROVENTIL) (2.5 mg/22m) 0.083% nebulizer solution    docusate sodium (COLACE) 10 mg/mL liquid    clonazePAM (KLONOPIN) 1 mg tablet    lacosamide (VIMPAT) 10 mg/mL SOLN oral solution    baclofen (LIORESAL) 10 mg tablet    budesonide (PULMICORT) 0.25 mg/227mnebulizer solution    carbamide peroxide (DEBROX) 6.5 % otic solution    famotidine (PEPCID) 20 mg tablet    fluticasone (FLONASE) 50 MCG/ACT nasal spray    ipratropium (ATROVENT) 0.02 % nebulizer solution    lactobacillus rhamnosus,  GG, (CULTURELLE) capsule    nystatin (NYSTATIN) powder    sodium chloride 0.9 % nebulizer solution    triamcinolone (KENALOG) 0.025 % cream    valproate (VALPROIC ACID) 50 mg/mL syrup    zinc oxide (DESITIN) 40 % paste    ipratropium (ATROVENT) 0.02 % nebulizer solution    Misc. Devices (PULSE OXIMETER) MISC    generic DME    MIC-KEY gastrostomy feeding tube extension set 12"    generic DME    generic DME    suction machine    generic DME    B-D UF III MINI PEN NEEDLES 31G X 5 MM    ACCU-CHEK AVIVA PLUS test strip    generic DME    MIC-KEY gastrostomy feeding tube extension set 24"    Syringe Disposable 60 ML MISC    gauze pads 2"X2"    generic DME    Nebulizers (PARI LC PLUS VIOS PRO NEB) MISC    tobramycin, PF, (TOBI) 300 MG/5ML nebulizer solution    generic DME    generic DME    nebulizer device with mask and tubing    generic DME    generic  DME    generic DME    Gauze Pads & Dressings (SPLIT GAUZE DRAINAGE SPONGE) 4"X4"    generic DME    Nebulizer MISC    generic DME    generic DME    gauze sponge (CURITY) 4"X4" pads    generic DME    Adhesive Tape 1"x5yd TAPE    generic DME    generic DME    surgical lubricant (SURGILUBE) gel    Distilled Water LIQD    generic DME    generic DME    generic DME    generic DME    generic DME    generic DME    generic DME    FIBER SELECT GUMMIES PO    Multiple Vitamins-Minerals (HM MULTIVITAMIN ADULT GUMMY PO)    Ascorbic Acid (VITAMIN C ADULT GUMMIES PO)    Cholecalciferol (VITAMIN D3) 50 MCG (2000 UT) capsule    cetirizine (ZYRTEC) 1 MG/ML syrup    oxygen    Blood Glucose Monitoring Suppl (FIFTY50 GLUCOSE METER 2.0) w/Device KIT    lactulose (LACTULOSE) 20 GM/30ML solution    levETIRAcetam (KEPPRA) 100 MG/ML solution    miconazole (MICATIN) 2 % powder    insulin pen needle (FIFTY50 PEN NEEDLES) 31G X 8 MM    Insulin Glargine, 2 Unit Dial, 300 UNIT/ML injection pen    insulin aspart (NOVOLOG FLEXPEN) 100 UNIT/ML  injection pen    EPINEPHrine (EPIPEN) 0.3 mg/0.3 mL auto-injector    bisacodyl ([facility] BISACODYL) 10 mg suppository    ibuprofen (ADVIL,MOTRIN) 600 mg tablet    diphenhydrAMINE (BENADRYL) 25 MG oral solid     No current facility-administered medications for this visit.          Objective     OBJECTIVE      BP 130/76    Pulse 89    Temp 36.7 C (98 F)    Wt 81.5 kg (179 lb 9.6 oz)    SpO2 95%    BMI 29.89 kg/m     Physical Exam  Constitutional:       General: He is not in acute distress.     Appearance: He is normal weight. He is not ill-appearing.      Comments: Examined on exam table patient cooperative with exam, RN reminds him to interact with her and Probation officer (seems to be having conversations with himself)   HENT:      Head: Normocephalic.      Right Ear: External ear normal.      Left Ear: External ear normal.      Ears:      Comments: R ear with whitish drainage in the ear canal, could not visualize perforation, TM otherwise clear     Mouth/Throat:      Mouth: Mucous membranes are moist.   Neck:      Comments: Trach site clean dry intact  Cardiovascular:      Rate and Rhythm: Normal rate and regular rhythm.      Pulses: Normal pulses.      Heart sounds: Normal heart sounds. No murmur heard.      Pulmonary:      Effort: Pulmonary effort is normal.      Breath sounds: Normal breath sounds. No wheezing.   Abdominal:      General: Abdomen is flat. There is no distension.      Palpations: Abdomen is soft.      Comments: gtube site without erythema or drainage   Skin:  General: Skin is warm and dry.      Capillary Refill: Capillary refill takes less than 2 seconds.   Neurological:      Mental Status: He is alert. Mental status is at baseline.      Comments: Can respond to questions but most of history obtained from PDN   Psychiatric:         Mood and Affect: Mood normal.              ASSESSMENT / DIAGNOSIS         1. Ventilator dependence  Uses mostly at night but when he is ill he often needs it intermittently  through the day.  Has a hard time with transition off the vent and can dip to low 80s for oxygen saturation when coming off of the vent. When ill would use normal saline through his G tube    2. TBI (traumatic brain injury)  underlying condition, all treatment decisions made with this in consideration. He is cared for at home with his father and by PDN.   - MIC-KEY gastrostomy kit 14FR; Please dispense every 3 months and prn, MicKey 14Fr 2.5cm  Dispense: 1 kit; Refill: 3    3. Type 2 diabetes mellitus with hyperglycemia, with long-term current use of insulin  BGs still out of control, will increase his nutritional baseline to 15 units with every meal and increase glargine to 110 units, reviewed diet changes that may help.  Check A1c in January.  Will have RD review carb consistent diet with family again    4. Constipation - chronic  Will increase senna to 2 tablets daily and continue lactulose and docusate      Orders Placed This Encounter    sodium chloride (NORMAL SALINE FLUSH) 0.9 % solution    MIC-KEY gastrostomy kit 14FR    senna (SENOKOT) 8.6 mg tablet     Patient Instructions   Please use plain instant oatmeal rather than apple cinnamon.    Try to decrease breads for his sandwiches or use half of a bread or wrap.      Increase basal to 15 units with every meal.  Increase Glargine to 110 units nightly.     Use normal saline through his g tube ONLY when he is sick. This can range from 571m -10026mdaily as directed by PCP.     For endocrinology appointment, please call [phone] the continuous glucose monitor.     Please bring in a replacement gtube to his next appointment once you get new mickey tube delivered. He uses a 14Fr button so that should be sufficient. (he has a 12Fr in right now.     We will check his A1c in January at next visit    His ears look good today    Continue docusate TID, increase senna to 2 tablets daily and continue lactulose TID.   Goal fluids should be 200085motal per  day         --Patient instructed to call if symptoms are not improving or worsening  --Follow-up arranged  Return in about 4 weeks (around 07/20/2020).       Electronically signed by ARIDoretha ImusD , 07/11/2020 @   [facility]: [phone]

## 2020-06-22 NOTE — Progress Notes (Signed)
Patient is here for follow up with PDN Cassandra. Requesting a weight be completed today.     Allergies and medications reviewed. VSS. Denies pain. Denies any recent falls.     Leonard Schwartz, RN

## 2020-06-22 NOTE — Patient Instructions (Addendum)
Please use plain instant oatmeal rather than apple cinnamon.    Try to decrease breads for his sandwiches or use half of a bread or wrap.      Increase basal to 15 units with every meal.  Increase Glargine to 110 units nightly.     Use normal saline through his g tube ONLY when he is sick. This can range from - daily as directed by PCP.     For endocrinology appointment, please call [phone] for the continuous glucose monitor.     Please bring in a replacement gtube to his next appointment once you get new mickey tube delivered. He uses a 14Fr button so that should be sufficient. (he has a 12Fr in right now.     We will check his A1c in January at next visit    His ears look good today    Continue docusate TID, increase senna to 2 tablets daily and continue lactulose TID.   Goal fluids should be total per day

## 2020-06-22 NOTE — Telephone Encounter (Signed)
Home RN called after hours wanting clarification on Angel Wells's mealtime inuslin.  Reviewed note, that basal was increased to 15u (as was glargine)     PM BG was <200, no sliding scale needed.    Discussed that I cannot discern whether or not PCP wanted to continue sliding scale in addition to basal insulin and that I would send a message to Dr. [name] regarding this.    [name], MD

## 2020-06-22 NOTE — Telephone Encounter (Signed)
[facility] TELEPHONE TRIAGE     Reason for call:  Cassandra requested to speak to a nurse to confirm questions regarding the patients insulin prescription changes.     Name of caller: Elonda Husky for Koki Buxton  Relationship to patient: RN  Phone: [phone]

## 2020-06-23 MED ORDER — INSULIN GLARGINE (2 UNIT DIAL) 300 UNIT/ML SC SOPN *I*
110.0000 [IU] | PEN_INJECTOR | Freq: Every evening | SUBCUTANEOUS | 5 refills | Status: DC
Start: 2020-06-23 — End: 2020-10-20

## 2020-06-23 MED ORDER — NOVOLOG FLEXPEN 100 UNIT/ML SC SOPN
PEN_INJECTOR | SUBCUTANEOUS | 5 refills | Status: DC
Start: 2020-06-23 — End: 2021-01-18

## 2020-06-23 NOTE — Telephone Encounter (Signed)
This was outlined in AVS given to LPN:  Increase nutritional baseline to 15 units + SSI  Increase night time glargine to 110 units.   Please call [name] to clarify.

## 2020-06-23 NOTE — Telephone Encounter (Signed)
[facility] TELEPHONE TRIAGE     Reason for call: Claris Gladden requested to clarify the patients sliding scale. Claris Gladden states there seems to be miscommunication between [facility] office and patients LPN.     Name of caller: Claris Gladden for Angel Wells  Relationship to patient: RN  Organization: [facility]  Phone: [phone]

## 2020-06-23 NOTE — Addendum Note (Signed)
Addended by: Midge Minium on: 06/23/2020 12:00 PM     Modules accepted: Orders

## 2020-06-23 NOTE — Telephone Encounter (Signed)
Attempted to call [name] back to clarify insulin orders.   No answer- LVM to call clinic back.     Leonard Schwartz, RN

## 2020-06-24 NOTE — Telephone Encounter (Signed)
Writer spoke to [place]. Writer relayed and confirmed the provided information.

## 2020-06-25 ENCOUNTER — Ambulatory Visit: Payer: Medicare Other

## 2020-06-30 ENCOUNTER — Telehealth: Payer: Self-pay | Admitting: Endocrinology

## 2020-06-30 NOTE — Telephone Encounter (Signed)
Copied from CRM #[id number]. Topic: Appointments - Schedule Appointment  >> Jun 30, 2020  1:20 PM Ian Bushman wrote:  [facility] nurse called office to schedule new patient visit     Patient is scheduled for: NPV 10/06/20 @ 1:00  Provider scheduled with: Dr. Suella Broad  Appointment type: IN PERSON  Diagnosis from referral: DM2 uncontrolled, interested in CGM monitoring    Patient will need new patient paperwork sent to address on file.    Any questions please call patient at: Telephone Information:  Mobile          [phone]    Aundra Millet can be reached at [phone]         .

## 2020-07-06 ENCOUNTER — Encounter: Payer: Self-pay | Admitting: Pediatrics

## 2020-07-11 ENCOUNTER — Other Ambulatory Visit: Payer: Self-pay | Admitting: Pediatrics

## 2020-07-11 DIAGNOSIS — IMO0002 Reserved for concepts with insufficient information to code with codable children: Secondary | ICD-10-CM

## 2020-07-11 DIAGNOSIS — E1165 Type 2 diabetes mellitus with hyperglycemia: Secondary | ICD-10-CM

## 2020-07-11 DIAGNOSIS — Z794 Long term (current) use of insulin: Secondary | ICD-10-CM

## 2020-07-12 ENCOUNTER — Telehealth: Payer: Self-pay | Admitting: Pediatrics

## 2020-07-12 DIAGNOSIS — G8250 Quadriplegia, unspecified: Secondary | ICD-10-CM

## 2020-07-12 DIAGNOSIS — Z9911 Dependence on respirator [ventilator] status: Secondary | ICD-10-CM

## 2020-07-12 DIAGNOSIS — N3946 Mixed incontinence: Secondary | ICD-10-CM

## 2020-07-12 DIAGNOSIS — S069XAA Unspecified intracranial injury with loss of consciousness status unknown, initial encounter: Secondary | ICD-10-CM

## 2020-07-12 NOTE — Telephone Encounter (Signed)
Incoming paperwork for completion to the [facility]  Date Received: 07/12/2020    Type of paperwork (sender:form type): [employer] client progress notes    Completed paperwork to be sent to:  Fax to [phone]    Special Requests:     Placed in box for: [name]

## 2020-07-13 ENCOUNTER — Ambulatory Visit: Payer: Medicare Other | Attending: Pediatrics | Admitting: Rehabilitative and Restorative Service Providers"

## 2020-07-13 DIAGNOSIS — Z9911 Dependence on respirator [ventilator] status: Secondary | ICD-10-CM | POA: Insufficient documentation

## 2020-07-13 DIAGNOSIS — G8250 Quadriplegia, unspecified: Secondary | ICD-10-CM | POA: Insufficient documentation

## 2020-07-13 MED ORDER — GENERIC DME *A*
0 refills | Status: AC
Start: 2020-07-13 — End: 2020-07-18

## 2020-07-13 MED ORDER — GENERIC DME *A*
0 refills | Status: AC
Start: 2020-07-13 — End: 2020-07-14

## 2020-07-13 NOTE — Progress Notes (Signed)
Physical Medicine & Rehabilitation  Occupational Therapy  Daily Note        Name:  Angel Wells  MRN:  [id number]  Diagnosis:    Encounter Diagnoses   Name Primary?    Ventilator dependence Yes    Spastic quadriplegia      Today's Date:  07/13/2020    Subjective: Patient's caregiver present during session. Reports that adjustments need to be made to previously fabricated orthosis    Pain: Pt does not rate     Intervention:  TREATMENT TIME  DESCRIPTION   Neuromuscular Re-Education 10 min - serial adjustments made to previously fabricated anti-spasticity splint on left UE-> doubled splinting material observed to have become deformed into increased wrist extension  - explained to caregiver the importance of skin checks and if splint continues to lose rigidity or causes skin breakdown, will need to discuss other splinting options, possible orthotics consult    Passive ROM Stretching/Neuroinhibition:  - reviewed and educated on passive ROM stretching of entire bilateral UE to reduce tone and improve ROM  - caregiver verbalizes understanding of detailed instructions to add to HEP     ADL 20 min -reviewed and educated various adapted utensils using right UE to promote increased self-feeding attempts  - information provided on curved utensils for improved angle for table>mouth movement patterns, printed handout provided regarding purchase options  - reviewed and trialed universal cuff for gross motor incorporation of right UE during feeding aspects, demos with caregiver donning/doffing universal cuff and incorporation of adapted utensils, printed handouts provided with various purchasing options          Patient education: Instructed patient in splint care, wear and precautions, Discussed progress towards goals, Discussed/Reviewed treatment plan    Assessment: Serial adjustments made to previous anti-spasticity splint as a result of redness and skin breakdown near pressure points. Improvements in skin integrity observed  after splint adjustments. Reviewed that additional recommendations may be needed if splint becomes intolerable or creates pressure sores, including orthotics consult. Digits and wrist noted to be resting in more flexed position compared to previous visit. Reviewed and provided strategies and adapted strategies for improved self-feeding of right UE. Also reviewed passive ROM stretching to promote improved ROM and neuroinhibition of bilateral UE. Caregiver states that they wish to trial various recommended options and make additional appointments if warranted.      Plan: OT on hold until recommendations are trialed, will make additional appointments if needed (splint adjustments, additional recommendations)    Stesha Neyens, OT  Minutes:  Total time: 30 min     Timed mins: 30 min  Untimed: 0   Unbillable: 0

## 2020-07-13 NOTE — Addendum Note (Signed)
Addended by: Midge Minium on: 07/13/2020 09:36 AM     Modules accepted: Orders

## 2020-07-13 NOTE — Telephone Encounter (Signed)
rx printed, please call [name] RN supervisor to ask where these rx need to go (wheelchair repairs and hospital bed mattress)

## 2020-07-18 ENCOUNTER — Other Ambulatory Visit
Admission: RE | Admit: 2020-07-18 | Discharge: 2020-07-18 | Disposition: A | Discharge status: Home / Self Care | Payer: Medicare Other | Source: Ambulatory Visit | Attending: Pediatrics | Admitting: Pediatrics

## 2020-07-18 DIAGNOSIS — Z0189 Encounter for other specified special examinations: Secondary | ICD-10-CM | POA: Insufficient documentation

## 2020-07-19 ENCOUNTER — Telehealth: Payer: Self-pay | Admitting: Neurology

## 2020-07-19 NOTE — Telephone Encounter (Signed)
I spoke with [name].  She was reviewing Angel Wells medication orders and had no record of the letter I sent to his My Chart account regarding his updated Vimpat orders.  I faxed a letter to the number she provided.

## 2020-07-19 NOTE — Telephone Encounter (Signed)
[name] from [facility] calling to receive verbal orders for Vimpat. She can be reached at [phone] or the fax number is [phone]. [name] also wanted you to know that the Pt is only having 1-2 seizures a month  Please advise

## 2020-07-19 NOTE — Telephone Encounter (Signed)
Spoke to [place], pt's dad.  Confirmed they have new g-tube at home.  Will bring for tomorrow's appt.  No further issues.

## 2020-07-20 ENCOUNTER — Other Ambulatory Visit
Admission: RE | Admit: 2020-07-20 | Discharge: 2020-07-20 | Disposition: A | Discharge status: Home / Self Care | Payer: Medicare Other | Source: Ambulatory Visit

## 2020-07-20 ENCOUNTER — Ambulatory Visit: Payer: Medicare Other | Attending: Pediatrics | Admitting: Pediatrics

## 2020-07-20 VITALS — BP 120/62 | HR 88 | Temp 96.8°F | Wt 177.2 lb

## 2020-07-20 DIAGNOSIS — S069X9A Unspecified intracranial injury with loss of consciousness of unspecified duration, initial encounter: Secondary | ICD-10-CM

## 2020-07-20 DIAGNOSIS — Z23 Encounter for immunization: Secondary | ICD-10-CM | POA: Insufficient documentation

## 2020-07-20 DIAGNOSIS — E1165 Type 2 diabetes mellitus with hyperglycemia: Secondary | ICD-10-CM | POA: Insufficient documentation

## 2020-07-20 DIAGNOSIS — G8250 Quadriplegia, unspecified: Secondary | ICD-10-CM | POA: Insufficient documentation

## 2020-07-20 DIAGNOSIS — Z794 Long term (current) use of insulin: Secondary | ICD-10-CM | POA: Insufficient documentation

## 2020-07-20 DIAGNOSIS — R093 Abnormal sputum: Secondary | ICD-10-CM | POA: Insufficient documentation

## 2020-07-20 DIAGNOSIS — S069XAA Unspecified intracranial injury with loss of consciousness status unknown, initial encounter: Secondary | ICD-10-CM

## 2020-07-20 DIAGNOSIS — Z9911 Dependence on respirator [ventilator] status: Secondary | ICD-10-CM

## 2020-07-20 DIAGNOSIS — G40219 Localization-related (focal) (partial) symptomatic epilepsy and epileptic syndromes with complex partial seizures, intractable, without status epilepticus: Secondary | ICD-10-CM

## 2020-07-20 MED ORDER — NON-SYSTEM MEDICATION *A*
0 refills | Status: DC
Start: 2020-07-20 — End: 2022-06-23

## 2020-07-20 MED ORDER — GENERIC DME *A*
0 refills | Status: DC
Start: 2020-07-20 — End: 2021-06-23

## 2020-07-20 NOTE — Patient Instructions (Addendum)
Follow up in 4-6 weeks by video Dr [name]    Continue same insulin regimen for now.   You got your Pfizer booster shot today  You can continue to use the Porters ointment around the gtube PRN however hopefully with larger gtube it won't be a problem  He has a 14Fr mickey button in  Increase chest vesting/cough assist to TID  We will let you know what trach aspirate shows.

## 2020-07-20 NOTE — Progress Notes (Signed)
     PROCEDURE NOTE    Procedure: G tube change  Indication:Chronic tube feedings  Consent:Procedure discussed. Risks, benefits, and alternatives discussed. Current standing consent from sister in house chart.  Pre-procedural Time Out:  Correct Procedure: yes  Correct Patient: (use 2 Identifiers) yes  Correct Site: yes  Site marked: no  Correct Patient Position:yes performed in tilt wheelchair  Appropriate Hand Hygiene Used: yes   List Any Participants Involved in Time-Out: patient, Terrance Mass, MD, Midge Minium, MD  Availability of correct implants and any special equipment: yes  Location:S of midline  Preparation: Area draped with towels and implements prepared and patient positioned in supine position  Procedure: G tube flushed with 5 cc of sterile water without resistence. Balloon deflated with withdrawal of 10 of water. G tube easily removed and replaced with 14Fr Mickey Button Sterile water 10 cc used to inflated balloon, length of button: 2.5 cm. G tube Flushed with 10 cc of sterile water easily with good BS  Aftercare: Resume medication administration via G tube per routine.     MIC-key low profile G Tube 14 Fr,2.5cm, balloon volume 10 ml Lot #[id number] 02/11/21  Midge Minium, MD

## 2020-07-20 NOTE — Progress Notes (Signed)
[place]    Office Visit Note     REASON FOR VISIT      Chief Complaint   Patient presents with    Diabetes         PRIMARY DIAGNOSIS      TBI, ventilator dependent    Subjective     SUBJECTIVE      Angel Wells was accompanied by caregiver Darrick Penna who provided all of subjective history     Increased secretions and low grade fever started last night, she brought sputum samples today, looks whitish/cloudy, no blood    No known covid exposures, is off the vent during the day, doing chest PT BID    DM2 - still with high sugars but overall this is improving      Here also for mickey button replacement    Review of Systems   Constitutional: Positive for fever.   HENT: Negative for ear pain and sore throat.    Respiratory: Positive for sputum production. Negative for cough, shortness of breath and wheezing.    Gastrointestinal: Negative.    Genitourinary: Negative.    Musculoskeletal: Negative.      [place]    Office Visit Note     REASON FOR VISIT      Chief Complaint   Patient presents with    Diabetes         PRIMARY DIAGNOSIS      TBI, DM2    Subjective     SUBJECTIVE      Angel Wells was accompanied by caregiver Cassandra PDN who provided all of subjective history   Breakfast - instant oatmeal, apple cinnamon  Lunch - (given by PDN) Veggie burger, chicken nuggets  Dinner - Dad gives this to him: occas pizza, cheese burger, mashed sweet potatoes, mixed veggies, apples  Night time snack - cheese stick     BG log shows AM sugars all above 200, one reading less than 200  Most meal time sugars are also in the 200s.       Weight today was 179.6lb with hoyer harness    PDn also reports they are using a specific ointment/lotion around gtube but she thinks this may be making the redness worse?  They will need an order for this    Also using electrolytes solution per gtube when he needs fluids, initially this had some sugar in it but now they report this is sugar free, did review that it  may still cause his sugars to go up if artificial sugars are used.  ROS  Review of Systems as per HPI above    Past Medical History, Social History, Family History, and Medications/allergies reviewed during this visit    Current Outpatient Medications   Medication    calcium carbonate 650 mg tablet    Cholecalciferol 25 MCG (1000 UT) CHEW    B-D UF III MINI PEN NEEDLES 31G X 5 MM    ACCU-CHEK AVIVA PLUS test strip    Insulin Glargine, 2 Unit Dial, 300 UNIT/ML injection pen    insulin aspart (NOVOLOG FLEXPEN) 100 UNIT/ML injection pen    senna (SENOKOT) 8.6 mg tablet    albuterol (PROVENTIL) (2.5 mg/62m) 0.083% nebulizer solution    docusate sodium (COLACE) 10 mg/mL liquid    clonazePAM (KLONOPIN) 1 mg tablet    lacosamide (VIMPAT) 10 mg/mL SOLN oral solution    bisacodyl ([facility] BISACODYL) 10 mg suppository    carbamide peroxide (DEBROX) 6.5 % otic solution  famotidine (PEPCID) 20 mg tablet    fluticasone (FLONASE) 50 MCG/ACT nasal spray    ibuprofen (ADVIL,MOTRIN) 600 mg tablet    ipratropium (ATROVENT) 0.02 % nebulizer solution    lactobacillus rhamnosus, GG, (CULTURELLE) capsule    nystatin (NYSTATIN) powder    sodium chloride 0.9 % nebulizer solution    triamcinolone (KENALOG) 0.025 % cream    valproate (VALPROIC ACID) 50 mg/mL syrup    zinc oxide (DESITIN) 40 % paste    ipratropium (ATROVENT) 0.02 % nebulizer solution    Misc. Devices (PULSE OXIMETER) MISC    generic DME    generic DME    generic DME    suction machine    generic DME    generic DME    Syringe Disposable 60 ML MISC    gauze pads 2"X2"    generic DME    Nebulizers (PARI LC PLUS VIOS PRO NEB) MISC    generic DME    generic DME    nebulizer device with mask and tubing    generic DME    generic DME    generic DME    Gauze Pads & Dressings (SPLIT GAUZE DRAINAGE SPONGE) 4"X4"    generic DME    Nebulizer MISC    generic DME    generic DME    gauze sponge (CURITY) 4"X4" pads    generic DME    Adhesive Tape  1"x5yd TAPE    generic DME    generic DME    surgical lubricant (SURGILUBE) gel    Distilled Water LIQD    generic DME    generic DME    generic DME    generic DME    generic DME    generic DME    generic DME    FIBER SELECT GUMMIES PO    Multiple Vitamins-Minerals (HM MULTIVITAMIN ADULT GUMMY PO)    Ascorbic Acid (VITAMIN C ADULT GUMMIES PO)    cetirizine (ZYRTEC) 1 MG/ML syrup    oxygen    Blood Glucose Monitoring Suppl (FIFTY50 GLUCOSE METER 2.0) w/Device KIT    lactulose (LACTULOSE) 20 GM/30ML solution    levETIRAcetam (KEPPRA) 100 MG/ML solution    miconazole (MICATIN) 2 % powder    insulin pen needle (FIFTY50 PEN NEEDLES) 31G X 8 MM    ipratropium (ATROVENT) 0.02 % nebulizer solution    Accu-Chek Softclix Lancets (ACCU-CHEK SOFTCLIX)    budesonide (PULMICORT) 0.25 mg/59m nebulizer solution    TOBI 300 MG/5ML nebulizer solution    baclofen (LIORESAL) 10 mg tablet    cetirizine (ZYRTEC) 10 mg tablet    metFORMIN (GLUCOPHAGE) 500 MG/5ML solution    chlorhexidine (PERIDEX) 0.12 % solution    Non-System Medication    generic DME    MIC-KEY gastrostomy kit 14FR    EPINEPHrine (EPIPEN) 0.3 mg/0.3 mL auto-injector    MIC-KEY gastrostomy feeding tube extension set 12"    MIC-KEY gastrostomy feeding tube extension set 24"    diphenhydrAMINE (BENADRYL) 25 MG oral solid     No current facility-administered medications for this visit.          Objective     OBJECTIVE      BP 120/62    Pulse 88    Temp 36 C (96.8 F)    Wt 80.4 kg (177 lb 3.2 oz)    SpO2 96%    BMI 29.49 kg/m     .physical           ASSESSMENT / DIAGNOSIS  1. Ventilator dependence  Uses mostly at night but when he is ill he often needs it intermittently through the day.  Has a hard time with transition off the vent and can dip to low 80s for oxygen saturation when coming off of the vent. When ill would use normal saline through his G tube    2. TBI (traumatic brain injury)  underlying condition, all treatment  decisions made with this in consideration. He is cared for at home with his father and by PDN.   - MIC-KEY gastrostomy kit 14FR; Please dispense every 3 months and prn, MicKey 14Fr 2.5cm  Dispense: 1 kit; Refill: 3    3. Type 2 diabetes mellitus with hyperglycemia, with long-term current use of insulin  BGs still out of control, will increase his nutritional baseline to 15 units with every meal and increase glargine to 110 units, reviewed diet changes that may help.  Check A1c in January.  Will have RD review carb consistent diet with family again    4. Constipation - chronic  Will increase senna to 2 tablets daily and continue lactulose and docusate      Orders Placed This Encounter    Aerobic culture    Covid-19 mRNA vaccine (PFIZER) IM 30 mcg/0.3 mL    Hemoglobin A1c    Non-System Medication    generic DME     Patient Instructions   Follow up in 4-6 weeks by video Dr Shanda Howells    Continue same insulin regimen for now.   You got your Pfizer booster shot today  You can continue to use the Porters ointment around the gtube PRN however hopefully with larger gtube it won't be a problem  He has a 14Fr mickey button in  Increase chest vesting/cough assist to TID  We will let you know what trach aspirate shows.         --Patient instructed to call if symptoms are not improving or worsening  --Follow-up arranged  Return in about 4 weeks (around 08/17/2020) for Dr Shanda Howells.       Electronically signed by Doretha Imus, MD , 08/25/2020 @   [place], Phone: [phone]  Review of Systems as per HPI above    Past Medical History, Social History, Family History, and Medications/allergies reviewed during this visit    Current Outpatient Medications   Medication    calcium carbonate 650 mg tablet    Cholecalciferol 25 MCG (1000 UT) CHEW    B-D UF III MINI PEN NEEDLES 31G X 5 MM    ACCU-CHEK AVIVA PLUS test strip    Insulin Glargine, 2 Unit Dial, 300 UNIT/ML injection pen    insulin aspart (NOVOLOG FLEXPEN) 100  UNIT/ML injection pen    senna (SENOKOT) 8.6 mg tablet    albuterol (PROVENTIL) (2.5 mg/57m) 0.083% nebulizer solution    docusate sodium (COLACE) 10 mg/mL liquid    clonazePAM (KLONOPIN) 1 mg tablet    lacosamide (VIMPAT) 10 mg/mL SOLN oral solution    bisacodyl ([facility]S BISACODYL) 10 mg suppository    carbamide peroxide (DEBROX) 6.5 % otic solution    famotidine (PEPCID) 20 mg tablet    fluticasone (FLONASE) 50 MCG/ACT nasal spray    ibuprofen (ADVIL,MOTRIN) 600 mg tablet    ipratropium (ATROVENT) 0.02 % nebulizer solution    lactobacillus rhamnosus, GG, (CULTURELLE) capsule    nystatin (NYSTATIN) powder    sodium chloride 0.9 % nebulizer solution    triamcinolone (KENALOG) 0.025 % cream    valproate (  VALPROIC ACID) 50 mg/mL syrup    zinc oxide (DESITIN) 40 % paste    ipratropium (ATROVENT) 0.02 % nebulizer solution    Misc. Devices (PULSE OXIMETER) MISC    generic DME    generic DME    generic DME    suction machine    generic DME    generic DME    Syringe Disposable 60 ML MISC    gauze pads 2"X2"    generic DME    Nebulizers (PARI LC PLUS VIOS PRO NEB) MISC    generic DME    generic DME    nebulizer device with mask and tubing    generic DME    generic DME    generic DME    Gauze Pads & Dressings (SPLIT GAUZE DRAINAGE SPONGE) 4"X4"    generic DME    Nebulizer MISC    generic DME    generic DME    gauze sponge (CURITY) 4"X4" pads    generic DME    Adhesive Tape 1"x5yd TAPE    generic DME    generic DME    surgical lubricant (SURGILUBE) gel    Distilled Water LIQD    generic DME    generic DME    generic DME    generic DME    generic DME    generic DME    generic DME    FIBER SELECT GUMMIES PO    Multiple Vitamins-Minerals (HM MULTIVITAMIN ADULT GUMMY PO)    Ascorbic Acid (VITAMIN C ADULT GUMMIES PO)    cetirizine (ZYRTEC) 1 MG/ML syrup    oxygen    Blood Glucose Monitoring Suppl (FIFTY50 GLUCOSE METER 2.0) w/Device KIT    lactulose (LACTULOSE) 20 GM/30ML  solution    levETIRAcetam (KEPPRA) 100 MG/ML solution    miconazole (MICATIN) 2 % powder    insulin pen needle (FIFTY50 PEN NEEDLES) 31G X 8 MM    ipratropium (ATROVENT) 0.02 % nebulizer solution    Accu-Chek Softclix Lancets (ACCU-CHEK SOFTCLIX)    budesonide (PULMICORT) 0.25 mg/10m nebulizer solution    TOBI 300 MG/5ML nebulizer solution    baclofen (LIORESAL) 10 mg tablet    cetirizine (ZYRTEC) 10 mg tablet    metFORMIN (GLUCOPHAGE) 500 MG/5ML solution    chlorhexidine (PERIDEX) 0.12 % solution    Non-System Medication    generic DME    MIC-KEY gastrostomy kit 14FR    EPINEPHrine (EPIPEN) 0.3 mg/0.3 mL auto-injector    MIC-KEY gastrostomy feeding tube extension set 12"    MIC-KEY gastrostomy feeding tube extension set 24"    diphenhydrAMINE (BENADRYL) 25 MG oral solid     No current facility-administered medications for this visit.          Objective     OBJECTIVE      BP 120/62    Pulse 88    Temp 36 C (96.8 F)    Wt 80.4 kg (177 lb 3.2 oz)    SpO2 96%    BMI 29.49 kg/m     Physical Exam  Constitutional:       General: He is not in acute distress.     Appearance: He is normal weight. He is not ill-appearing.      Comments: Examined on exam table patient cooperative with exam, RN reminds him to interact with her and wProbation officer(seems to be having conversations with himself)   HENT:      Head: Normocephalic.      Right Ear: External ear normal.  Left Ear: External ear normal.      Ears:      Comments: R ear with whitish drainage in the ear canal, could not visualize perforation, TM otherwise clear     Mouth/Throat:      Mouth: Mucous membranes are moist.   Neck:      Comments: Trach site clean dry intact  Cardiovascular:      Rate and Rhythm: Normal rate and regular rhythm.      Pulses: Normal pulses.      Heart sounds: Normal heart sounds. No murmur heard.  Pulmonary:      Effort: Pulmonary effort is normal.      Breath sounds: Normal breath sounds. No wheezing.   Abdominal:      General:  Abdomen is flat. There is no distension.      Palpations: Abdomen is soft.      Comments: gtube site without erythema or drainage   Skin:     General: Skin is warm and dry.      Capillary Refill: Capillary refill takes less than 2 seconds.   Neurological:      Mental Status: He is alert. Mental status is at baseline.      Comments: Can respond to questions but most of history obtained from PDN   Psychiatric:         Mood and Affect: Mood normal.              ASSESSMENT / DIAGNOSIS         1. Need for COVID-19 vaccine  Given today  - Covid-19 mRNA vaccine (PFIZER) IM 30 mcg/0.3 mL    2. Type 2 diabetes mellitus with hyperglycemia, with long-term current use of insulin  Continue same insulin regimen, will check HbA1c, reviewed log with PDN today  - Hemoglobin A1c; Future  - Hemoglobin A1c    3. Increased secretions  Will send trach aspirates for culture, increase chest PT/cough assist to TID  Hold on antibiotics until trach aspirate returns    4. Gtube dependent  MicKey button changed out today to 14Fr (see note for details) Ok to have this changed every 3 months by PDN at home    Orders Placed This Encounter    Aerobic culture    Covid-19 mRNA vaccine (PFIZER) IM 30 mcg/0.3 mL    Hemoglobin A1c    Non-System Medication    generic DME     Patient Instructions   Follow up in 4-6 weeks by video Dr Shanda Howells    Continue same insulin regimen for now.   You got your Pfizer booster shot today  You can continue to use the Porters ointment around the gtube PRN however hopefully with larger gtube it won't be a problem  He has a 14Fr mickey button in  Increase chest vesting/cough assist to TID  We will let you know what trach aspirate shows.         --Patient instructed to call if symptoms are not improving or worsening  --Follow-up arranged  Return in about 4 weeks (around 08/17/2020) for Dr Shanda Howells.       Electronically signed by Doretha Imus, MD , 08/25/2020 @   [place], Phone: [phone]

## 2020-07-21 ENCOUNTER — Telehealth: Payer: Self-pay | Admitting: Pediatrics

## 2020-07-21 ENCOUNTER — Other Ambulatory Visit: Payer: Self-pay | Admitting: Pediatrics

## 2020-07-21 LAB — GRAM STAIN: Gram Stain: 0

## 2020-07-21 LAB — AEROBIC CULTURE

## 2020-07-21 LAB — HEMOGLOBIN A1C: Hemoglobin A1C: 7.9 % — ABNORMAL HIGH

## 2020-07-21 NOTE — Telephone Encounter (Signed)
Called and spoke to [place].   She states she called yesterday and requested a phone call back before 1630.     Apologized to [facility] that this phone call just got sent to nursing about 15 minutes ago.     She states she will not be seeing Tatsuo today and she doesn't need clarification anymore because a different nurse took over for the next few days.     Asked if I needed to call someone else to help clarify -Burnett Harry states that if the other nurse needs clarification she will call the office.     Leonard Schwartz, RN

## 2020-07-21 NOTE — Telephone Encounter (Signed)
[facility] TELEPHONE TRIAGE     Reason for call: Shelly requested to clarify orders that have been received for the patient. Shelly requested to receive  Verbal orders.     Name of caller: Burnett Harry for [name]  Relationship to patient: RN  Organization: [facility]  Phone: [phone]

## 2020-07-22 ENCOUNTER — Telehealth: Payer: Self-pay | Admitting: Pediatrics

## 2020-07-22 NOTE — Telephone Encounter (Signed)
[facility] TELEPHONE TRIAGE     Reason for call: [name] requested to provide an update regarding the patient. [name] relayed the patients secretions are white and the patient has no fever. [name] relayed the patient will receive treatment through the weekend.     Name of caller: [name] for [facility]  Relationship to patient: PDN  Phone: [phone]

## 2020-07-22 NOTE — Telephone Encounter (Signed)
Called to check in with Angel Wells  Reports pts secretions are still white (from green over the weekend)  Not as copious as they had been  Afebrile  Dad believes this is all due to booster pt received  Angel Wells thinks pt was sick, and potentially this is some booster affect    Pt is getting 3 airway treatments a day (including vest/cough assist)  Angel Wells is going to continue this for the next couple of days as it seems to be Architectural technologist relayed pts A1c results    Will route to MD to advise

## 2020-07-23 LAB — LEGIONELLA CULTURE
Legionella Culture: 0
Legionella Culture: 0

## 2020-07-23 MED ORDER — METFORMIN HCL 500 MG PO TB24 *I*
500.0000 mg | ORAL_TABLET | Freq: Every day | ORAL | 5 refills | Status: DC
Start: 2020-07-23 — End: 2020-07-27

## 2020-07-23 NOTE — Telephone Encounter (Signed)
Attempted to call [name], RN.  No answer and no voicemail.   Will attempt again today.     Leonard Schwartz, RN

## 2020-07-23 NOTE — Telephone Encounter (Signed)
Attempted to call [name] again.   No answer-unable to LVM    Brandan Robicheaux Faythe Casa, RN

## 2020-07-23 NOTE — Telephone Encounter (Signed)
Please call kate back, I would like to start metformin once daily to help address the higher A1c rather than increase insulin again. Rx sent to [email or web address]

## 2020-07-23 NOTE — Addendum Note (Signed)
Addended by: Midge Minium on: 07/23/2020 01:53 PM     Modules accepted: Orders

## 2020-07-24 ENCOUNTER — Encounter: Payer: Self-pay | Admitting: Pediatrics

## 2020-07-24 LAB — AEROBIC CULTURE

## 2020-07-27 MED ORDER — METFORMIN HCL 500 MG/5ML PO SOLN *I*
500.0000 mg | Freq: Two times a day (BID) | ORAL | 5 refills | Status: DC
Start: 2020-07-27 — End: 2021-02-14

## 2020-07-27 NOTE — Addendum Note (Signed)
Addended by: Midge Minium on: 07/27/2020 10:55 AM     Modules accepted: Orders

## 2020-07-27 NOTE — Telephone Encounter (Signed)
Sent in new rx for liquid metformin for g tube this must be given twice a day

## 2020-07-27 NOTE — Telephone Encounter (Signed)
Called and LVM for [name], Charity fundraiser.     Leonard Schwartz, RN

## 2020-07-27 NOTE — Telephone Encounter (Signed)
Writer spoke to  [place]. [name] reports the patient will chew anything the patient puts in his mouth. [name] inquired if there was another form of this medication this patient could put through g-tube. [name] states maybe the regular release metformin to crush and place in g-tube. [name] relayed she works nights and may not be awake between 12-6 PM.   [phone]

## 2020-07-28 NOTE — Telephone Encounter (Signed)
Called and spoke to [place], Charity fundraiser.  Advised of new RX- Metformin liquid twice a day.   [name] verbalized understanding.       Leonard Schwartz, RN

## 2020-07-29 ENCOUNTER — Telehealth: Payer: Self-pay | Admitting: Pediatrics

## 2020-07-29 ENCOUNTER — Other Ambulatory Visit: Payer: Self-pay | Admitting: Pediatrics

## 2020-07-29 NOTE — Telephone Encounter (Signed)
[facility] TELEPHONE TRIAGE     Reason for call: Junious Dresser states she needs a copy of the order that states the patient is to have mic-key button changed four times yearly. Junious Dresser states she needs this form for documentation purposes.     Name of caller: Junious Dresser for Angel Wells  Relationship to patient: Care Manager  Phone: [phone]  Fax: [phone]

## 2020-07-29 NOTE — Telephone Encounter (Signed)
This order is in the pt medication list. Routing to front desk to print the order and fax to care manager as requested.  This task does not require RN triage.

## 2020-07-30 ENCOUNTER — Other Ambulatory Visit: Payer: Self-pay | Admitting: Pediatrics

## 2020-07-30 DIAGNOSIS — M2450 Contracture, unspecified joint: Secondary | ICD-10-CM

## 2020-08-01 ENCOUNTER — Other Ambulatory Visit: Payer: Self-pay | Admitting: Pediatrics

## 2020-08-01 DIAGNOSIS — J4540 Moderate persistent asthma, uncomplicated: Secondary | ICD-10-CM

## 2020-08-02 ENCOUNTER — Other Ambulatory Visit: Payer: Self-pay | Admitting: Pediatrics

## 2020-08-03 ENCOUNTER — Telehealth: Payer: Self-pay | Admitting: Pediatrics

## 2020-08-03 NOTE — Telephone Encounter (Signed)
[facility] TELEPHONE TRIAGE     Reason for call: Writer spoke to the pharmacist, Misty Stanley. Misty Stanley relayed the patients test strips went through Medicare part B but the patients lancets are rejecting because the insurance will only cover testing for three times daily.     Writer spoke to the patients father and PDN. Both relayed the patient is currently testing only three times daily.     Patient: Angel Wells  Phone: [phone]

## 2020-08-03 NOTE — Telephone Encounter (Signed)
[facility] TELEPHONE INTAKE    Reason for call:Kate from at home called and says shes waiting on PA for Glucose strips. Can you give her a call with an update please?  Name of caller:Kate At home nurse  Phone: [phone]

## 2020-08-04 NOTE — Telephone Encounter (Signed)
[facility]  Prior Authorization Request    Date of request: 08/04/2020  Time of call: 8:47 AM    Medication Requiring PA: ACCU-CHEK AVIVA PLUS test strip   Directions(dosing and frequency): USE 1 STRIP 3 (THREE) TIMES DAILY AND AS NEEDED.DX: E11.65 AND Z79.4  Quantity/ Day Supply: 100/30  Dx code: E11.65, Z79.4    Request received from:[facility] PA Source: Pharmacy  Name and Phone Number of Pharmacy:  [phone]   [phone]     Additional Pharmacy Benefit plan: no  Insurance coverage confirmed: yes    Tax ID: [id number]  Provider and NPI: [name]- [id number]    Please provide justification for PA submission & urgency or indicate which covered alternative was ordered.

## 2020-08-09 ENCOUNTER — Encounter: Payer: Self-pay | Admitting: Gastroenterology

## 2020-08-10 ENCOUNTER — Telehealth: Payer: Self-pay

## 2020-08-10 NOTE — Telephone Encounter (Signed)
Incoming paperwork for completion to the [facility]  Date Received: 08/10/2020    Type of paperwork (sender:form type): [facility] Telephone orders     Completed paperwork to be sent to:  Fax to [phone]    Special Requests:     Placed in box for: [name]

## 2020-08-10 NOTE — Telephone Encounter (Signed)
[name] nurse [name] arrived to clinic to discuss hypoglycemia and face rash.  Dropped off recent documents from January - February of BG's.  One noted at 94, remaining from 110-154.  Reviewed hypoglycemia levels and s/s.  Front staff copied document and placed in PCP mailbox and into scanning.    [name] texted [facility] RN line of face rash.  Denies bug bites or trauma noted.  Denies drainage.  Showed image to PCP.          Pt consents to send and receive text messages via the nurse phone at [facility].   Pt may send secure patient information such as forms, pictures, and BG logs to the [email or web address] if unable to send via mychart.  The Smart phone hours of operation are M-F 8:30-4:30  All voice calls will forward to the main office phone since the smart phone is not meant for receiving calls.   [facility] smartphone is for work use only.

## 2020-08-11 ENCOUNTER — Telehealth: Payer: Self-pay | Admitting: Pediatrics

## 2020-08-11 MED ORDER — ACCU-CHEK SOFTCLIX LANCETS MISC *A*
5 refills | Status: DC
Start: 2020-08-11 — End: 2020-09-09

## 2020-08-11 NOTE — Telephone Encounter (Signed)
[facility] TELEPHONE TRIAGE     Reason for call:  [name] states the patient has an "angry right eardrum". [name] states she has the patients previous eardrops available that are not expired. [name] relayed the otic solution is labeled neomycin, polymixin, and hydrocortisone. [name] inquired if she can administer these drops to the patient.     Name of caller: [name] for Angel Wells  Relationship to patient: PDN  Phone: [phone]

## 2020-08-11 NOTE — Telephone Encounter (Signed)
Called and l/m for Angel Wells that MD was ok with using the ear drops for 5 days  If things are not improved, would require an in person visit  Asked that [name] call the office back to confirm she got the message and is ok with the plan

## 2020-08-12 ENCOUNTER — Telehealth: Payer: Self-pay | Admitting: Pediatrics

## 2020-08-12 NOTE — Telephone Encounter (Signed)
Incoming paperwork for completion to the [facility]  Date Received: 08/12/2020    Type of paperwork (sender:form type): [employer] Revision of care  Completed paperwork to be sent to:  Fax to [phone]    Special Requests:     Placed in box for: [name]

## 2020-08-13 NOTE — Telephone Encounter (Signed)
Spoke with [name], she did received message and has been giving pt the drops as directed on the bottle. Says that sx seem to be improving.

## 2020-08-13 NOTE — Telephone Encounter (Signed)
Writer provided correct insurance information to the pharmacy. Patients prescription filled and picked up. No further needs at this time.

## 2020-08-16 ENCOUNTER — Encounter: Payer: Self-pay | Admitting: Gastroenterology

## 2020-08-16 NOTE — Telephone Encounter (Signed)
Paperwork completed 08/16/2020  Sent as outlined in prior documentation

## 2020-08-17 ENCOUNTER — Encounter: Payer: Self-pay | Admitting: Gastroenterology

## 2020-08-17 ENCOUNTER — Telehealth: Payer: Self-pay

## 2020-08-17 NOTE — Telephone Encounter (Signed)
Incoming paperwork for completion to the [facility]  Date Received: 08/17/2020    Type of paperwork (sender:form type): NurseCore telephone orders Right eardrum    Completed paperwork to be sent to:  Fax to [phone]    Special Requests:     Placed in box for: [name]

## 2020-08-19 ENCOUNTER — Other Ambulatory Visit: Payer: Self-pay | Admitting: Pediatrics

## 2020-08-19 ENCOUNTER — Ambulatory Visit: Payer: Medicare Other | Admitting: Optometry

## 2020-08-19 DIAGNOSIS — J4540 Moderate persistent asthma, uncomplicated: Secondary | ICD-10-CM

## 2020-08-20 NOTE — Telephone Encounter (Signed)
Paperwork completed 08/20/2020  Sent as outlined in prior documentation

## 2020-08-23 ENCOUNTER — Telehealth: Payer: Self-pay | Admitting: Pediatrics

## 2020-08-23 NOTE — Telephone Encounter (Signed)
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

## 2020-08-23 NOTE — Telephone Encounter (Signed)
[place]    Reason for call: Babs Bertin nurse called and says she needs a PA for the [employer] formin solution danwin is sending paper work. To be on the look out. She called and asked for lauren. Writer told her we will be on the look out.    Name of caller: Anda Kraft  Relationship to patient:RN  Phone:  [phone]

## 2020-08-24 NOTE — Telephone Encounter (Signed)
Paperwork completed 08/24/2020  Sent as outlined in prior documentation

## 2020-08-24 NOTE — Telephone Encounter (Signed)
Writer spoke to [employer]. Clinical research associate relayed and confirmed patients prior authorization approval information. Patients medication will be filled and patients mother notified for pick up.

## 2020-08-25 ENCOUNTER — Ambulatory Visit: Payer: Medicare Other | Admitting: Pediatrics

## 2020-08-30 ENCOUNTER — Other Ambulatory Visit: Payer: Self-pay | Admitting: Pediatrics

## 2020-08-31 ENCOUNTER — Other Ambulatory Visit
Admission: RE | Admit: 2020-08-31 | Discharge: 2020-08-31 | Disposition: A | Discharge status: Home / Self Care | Payer: Medicare Other | Source: Ambulatory Visit | Attending: Pediatrics | Admitting: Pediatrics

## 2020-08-31 ENCOUNTER — Other Ambulatory Visit: Payer: Self-pay | Admitting: Pediatrics

## 2020-08-31 ENCOUNTER — Telehealth: Payer: Self-pay

## 2020-08-31 ENCOUNTER — Telehealth: Payer: Self-pay | Admitting: Pediatrics

## 2020-08-31 DIAGNOSIS — R569 Unspecified convulsions: Secondary | ICD-10-CM

## 2020-08-31 DIAGNOSIS — Z0189 Encounter for other specified special examinations: Secondary | ICD-10-CM | POA: Insufficient documentation

## 2020-08-31 LAB — GRAM STAIN

## 2020-08-31 NOTE — Telephone Encounter (Signed)
Claris Gladden requested to confirm the patients vesting, albuterol and other medication to three times daily. Claris Gladden states the patients father would like the patient taken off the vent. Claris Gladden requested to speak to a nurse today.

## 2020-08-31 NOTE — Telephone Encounter (Signed)
Incoming paperwork for completion to the [facility]  Date Received: 08/31/2020    Type of paperwork (sender:form type): [employer] OT exercise     Completed paperwork to be sent to:  Fax to [phone]    Special Requests:     Placed in box for: [name]

## 2020-08-31 NOTE — Telephone Encounter (Signed)
[facility] TELEPHONE TRIAGE     Reason for call: Aundra Millet states the patients oxygen levels have not been risen above 90 and the patient is on a vent. Aundra Millet inquired if the patients respiratory treatment could be increased.     Name of caller: Aundra Millet for Angel Wells  Relationship to patient: RN  Organization: Nurse Core   Phone: [phone]

## 2020-08-31 NOTE — Telephone Encounter (Signed)
[facility] TELEPHONE TRIAGE     Reason for call: Angel Wells states the patient has increased yellow secretions today. Angel Wells requested to speak to a nurse regarding the patients vesting and respiratory treatments.     Name of caller: Angel Wells for Angel Wells  Relationship to patient: RN  Organization: Nurse Core  Phone: [phone]

## 2020-09-01 ENCOUNTER — Telehealth: Payer: Self-pay | Admitting: Pediatrics

## 2020-09-01 ENCOUNTER — Encounter: Payer: Self-pay | Admitting: Gastroenterology

## 2020-09-01 NOTE — Telephone Encounter (Signed)
Separate encounter with further details starting 2/22.  Will complete this encounter.

## 2020-09-01 NOTE — Telephone Encounter (Addendum)
Called and left message with [name] to call back in order to discuss PCP recommendations.     [name] called back and says that they will increased to QID. They have given sample of trach aspirate which resulted 2/22.      [name] mentions that she will be going to sleep soon and will wake up by 5:30. Further updates can be communicated with pts father. He has been looped in by [place] for this concern.

## 2020-09-01 NOTE — Telephone Encounter (Signed)
Paperwork completed 09/01/2020  Sent as outlined in prior documentation

## 2020-09-01 NOTE — Telephone Encounter (Signed)
Sorry I forgot that he had dropped those off, the gram stain is not consistent with infection (<1PMN) so would hold off on antibiotics for now, keep doing the QID clearance and please check in again tomorrow.

## 2020-09-01 NOTE — Telephone Encounter (Signed)
[facility] TELEPHONE INTAKE    Reason for call: Remo Lipps nurse called and said his saturation Oxygen is 88/90 and she is wondering if she could get a order for a portable chest X-ray. [name] is requesting a call back.    Name of caller: [name]  Relationship to patient: Nurse  Phone: [phone]

## 2020-09-01 NOTE — Telephone Encounter (Signed)
Incoming paperwork for completion to the [facility]  Date Received: 09/01/2020    Type of paperwork (sender:form type): NurseCore Decrease Sats    Completed paperwork to be sent to:  Fax to [phone]    Special Requests:     Placed in box for: [name]

## 2020-09-01 NOTE — Telephone Encounter (Signed)
LVM with [name] to discuss symptoms and imaging needs.

## 2020-09-01 NOTE — Telephone Encounter (Signed)
LVM with Claris Gladden to discuss [name]'s symptoms.  Call back number provided.

## 2020-09-01 NOTE — Telephone Encounter (Signed)
Mechele Collin, AliciaTechnicianSigned  9:27 AM    Addend                 [facility] TELEPHONE INTAKE    Reason for call: Remo Lipps nurse called and said his saturation Oxygen is 88/90 and she is wondering if she could get a order for a portable chest X-ray. [name] is requesting a call back.    Name of caller: [name]  Relationship to patient: Nurse  Phone: [phone]

## 2020-09-01 NOTE — Telephone Encounter (Signed)
Spoke with pt nurse [name] she says that pt is on [facility], has been doing extra suctioning the past few days, today copious amounts of thick yellow secreations. Pt has been routinely vesting and doing breathing treatments as directed. Says that pts O2 remains 88/90.    Denies that he has had any fever, Nurse says that he was on vent all day yesterday and Pt did not like that. [name] says that pt is "acting like himself"     Would like to know if PCP would like to have mobile imaging come to house for chest xray.     Will route to PCP for review. Levonne Lapping to call back for further concerns.

## 2020-09-01 NOTE — Telephone Encounter (Signed)
Called and left message to call back. If mom or dad call back please see if writer is available to speak with about message from PCP.

## 2020-09-02 ENCOUNTER — Other Ambulatory Visit
Admission: RE | Admit: 2020-09-02 | Discharge: 2020-09-02 | Disposition: A | Discharge status: Home / Self Care | Payer: Medicare Other | Source: Ambulatory Visit | Attending: Pediatrics | Admitting: Pediatrics

## 2020-09-02 ENCOUNTER — Telehealth: Payer: Self-pay | Admitting: Pediatrics

## 2020-09-02 ENCOUNTER — Encounter: Payer: Self-pay | Admitting: Gastroenterology

## 2020-09-02 DIAGNOSIS — R0902 Hypoxemia: Secondary | ICD-10-CM | POA: Insufficient documentation

## 2020-09-02 DIAGNOSIS — R0600 Dyspnea, unspecified: Secondary | ICD-10-CM | POA: Insufficient documentation

## 2020-09-02 LAB — GRAM STAIN

## 2020-09-02 LAB — AEROBIC CULTURE: Aerobic Culture: 0

## 2020-09-02 LAB — MIN INHIB CONCENTRATION

## 2020-09-02 NOTE — Telephone Encounter (Signed)
Incoming paperwork for completion to the [facility]  Date Received: 09/02/2020    Type of paperwork (sender:form type): NurseCore Hyperglycemia    Completed paperwork to be sent to:  Fax to [phone]    Special Requests:     Placed in box for: [name]

## 2020-09-02 NOTE — Telephone Encounter (Signed)
Additionally-Jaymarion's sputum culture came back with no predominant organism, just normal throat flora. Please let them know.    [name], MD

## 2020-09-03 NOTE — Telephone Encounter (Signed)
Paperwork completed 09/03/2020  Sent as outlined in prior documentation

## 2020-09-03 NOTE — Telephone Encounter (Signed)
Spoke with [name] she says that pt is doing well. Stats are back to normal./  [name] feels that these sx start when pt is on Tobramycin.  She would like to review pts chart to see is this is the case. She will call us back if she finds that this is the case and give that feedback to the PCP.     She denies further concerns at this time.

## 2020-09-07 ENCOUNTER — Telehealth: Payer: Self-pay | Admitting: Pediatrics

## 2020-09-07 ENCOUNTER — Encounter: Payer: Self-pay | Admitting: Gastroenterology

## 2020-09-07 DIAGNOSIS — Z794 Long term (current) use of insulin: Secondary | ICD-10-CM

## 2020-09-07 DIAGNOSIS — J4540 Moderate persistent asthma, uncomplicated: Secondary | ICD-10-CM

## 2020-09-07 DIAGNOSIS — E1165 Type 2 diabetes mellitus with hyperglycemia: Secondary | ICD-10-CM

## 2020-09-07 DIAGNOSIS — J45909 Unspecified asthma, uncomplicated: Secondary | ICD-10-CM

## 2020-09-07 MED ORDER — IPRATROPIUM BROMIDE 0.02 % IN SOLN *I*
500.0000 ug | Freq: Three times a day (TID) | RESPIRATORY_TRACT | 5 refills | Status: DC
Start: 2020-09-07 — End: 2020-09-09

## 2020-09-07 MED ORDER — ACCU-CHEK FASTCLIX LANCETS MISC
5 refills | Status: DC
Start: 2020-09-07 — End: 2020-09-09

## 2020-09-07 MED ORDER — ALBUTEROL SULFATE (2.5 MG/3ML) 0.083% IN NEBU *I*
2.5000 mg | INHALATION_SOLUTION | Freq: Three times a day (TID) | RESPIRATORY_TRACT | 3 refills | Status: DC
Start: 2020-09-07 — End: 2020-09-09

## 2020-09-07 NOTE — Telephone Encounter (Signed)
Spoke to [place], Charity fundraiser.  Relayed that [name] has been doing great since recent illness.  Requested 2 items to be considered.    1.  Requested to decreased airway clearance  Currently receiving nebs and vesting 4x/day d/t recent illness.  Baseline is twice a day.  Would like to return to baseline but at a higher rate of TID.  Pended meds per request.      2.  Requested to return to [facility] lancets.  Previously on SlowClix.  Unsure why the change occurred.  Reports it's easier to manage.  Also, has endo appt soon and will discuss using a dexcom for monitoring.

## 2020-09-07 NOTE — Telephone Encounter (Signed)
meds sent in, Please advise they go back down to clearance regimen of TID

## 2020-09-07 NOTE — Addendum Note (Signed)
Addended by: Lucinda Dell C on: 09/07/2020 11:59 AM     Modules accepted: Orders

## 2020-09-07 NOTE — Telephone Encounter (Signed)
Paperwork completed 09/07/2020  Sent as outlined in prior documentation

## 2020-09-07 NOTE — Addendum Note (Signed)
Addended by: Midge Minium on: 09/07/2020 10:02 PM     Modules accepted: Orders

## 2020-09-07 NOTE — Telephone Encounter (Signed)
[facility] TELEPHONE TRIAGE     Reason for call: Writer spoke to [place] she wanted to give a process report [name] is doing great, no fever and back to normal.  They are discontinuing  the 4 times a day and would like to go to 3 times a day for routine and stay at 3 times a day instead of going back to 2 times a day???      Name of caller: [name]  Relationship to patient: Nurse  Phone:  [phone]

## 2020-09-08 LAB — AEROBIC CULTURE

## 2020-09-08 NOTE — Telephone Encounter (Signed)
Writer called and spoke with Dad [name]) to relay message to go back to TID clearance regimen. Dad said they anticipated that. No further questions and in agreement with plan.

## 2020-09-09 LAB — LEGIONELLA CULTURE: Legionella Culture: 0

## 2020-09-09 NOTE — Addendum Note (Signed)
Addended by: Gerrie Nordmann on: 09/09/2020 08:53 AM     Modules accepted: Orders

## 2020-09-11 LAB — LEGIONELLA CULTURE: Legionella Culture: 0

## 2020-09-13 ENCOUNTER — Telehealth: Payer: Self-pay | Admitting: Pediatrics

## 2020-09-13 MED ORDER — ACCU-CHEK SOFTCLIX LANCETS MISC *A*
5 refills | Status: DC
Start: 2020-09-13 — End: 2020-09-14

## 2020-09-13 MED ORDER — IPRATROPIUM BROMIDE 0.02 % IN SOLN *I*
500.0000 ug | Freq: Three times a day (TID) | RESPIRATORY_TRACT | 5 refills | Status: DC
Start: 2020-09-13 — End: 2020-11-09

## 2020-09-13 MED ORDER — ALBUTEROL SULFATE (2.5 MG/3ML) 0.083% IN NEBU *I*
2.5000 mg | INHALATION_SOLUTION | Freq: Three times a day (TID) | RESPIRATORY_TRACT | 3 refills | Status: DC
Start: 2020-09-13 — End: 2021-03-02

## 2020-09-13 NOTE — Addendum Note (Signed)
Addended by: Midge Minium on: 09/13/2020 06:49 AM     Modules accepted: Orders

## 2020-09-13 NOTE — Telephone Encounter (Signed)
Incoming paperwork for completion to the [facility]  Date Received: 09/13/2020    Type of paperwork (sender:form type): [employer] telephone orders and home health cert    Completed paperwork to be sent to:  Fax to [phone]    Special Requests:     Placed in box for: [name]

## 2020-09-14 ENCOUNTER — Other Ambulatory Visit: Payer: Self-pay | Admitting: Pediatrics

## 2020-09-14 DIAGNOSIS — G8250 Quadriplegia, unspecified: Secondary | ICD-10-CM

## 2020-09-14 DIAGNOSIS — Z794 Long term (current) use of insulin: Secondary | ICD-10-CM

## 2020-09-14 DIAGNOSIS — Z9911 Dependence on respirator [ventilator] status: Secondary | ICD-10-CM

## 2020-09-14 DIAGNOSIS — J9611 Chronic respiratory failure with hypoxia: Secondary | ICD-10-CM

## 2020-09-14 MED ORDER — ACCU-CHEK SOFTCLIX LANCETS MISC *A*
5 refills | Status: DC
Start: 2020-09-14 — End: 2020-09-17

## 2020-09-14 MED ORDER — GENERIC DME *A*
0 refills | Status: AC
Start: 2020-09-14 — End: ?

## 2020-09-15 ENCOUNTER — Telehealth: Payer: Self-pay | Admitting: Pediatrics

## 2020-09-15 NOTE — Telephone Encounter (Signed)
Incoming paperwork for completion to the [facility]  Date Received: 09/15/2020    Type of paperwork (sender:form type): [employer] comp. Resp. Sensor orders    Completed paperwork to be sent to:  Fax to [phone]    Special Requests:     Placed in box for: [name]

## 2020-09-16 ENCOUNTER — Encounter: Payer: Self-pay | Admitting: Gastroenterology

## 2020-09-16 NOTE — Telephone Encounter (Signed)
Paperwork completed 09/16/2020  Sent as outlined in prior documentation  [phone]

## 2020-09-17 ENCOUNTER — Telehealth: Payer: Self-pay | Admitting: Pediatrics

## 2020-09-17 ENCOUNTER — Other Ambulatory Visit: Payer: Self-pay | Admitting: Pediatrics

## 2020-09-17 ENCOUNTER — Encounter: Payer: Self-pay | Admitting: Gastroenterology

## 2020-09-17 DIAGNOSIS — E1165 Type 2 diabetes mellitus with hyperglycemia: Secondary | ICD-10-CM

## 2020-09-17 MED ORDER — ACCU-CHEK SOFTCLIX LANCETS MISC *A*
5 refills | Status: DC
Start: 2020-09-17 — End: 2020-10-22

## 2020-09-17 NOTE — Telephone Encounter (Signed)
Incoming paperwork for completion to the [facility]  Date Received: 09/17/2020    Type of paperwork (sender:form type): NurseCore orders hyperglycesmia    Completed paperwork to be sent to:  Fax to [phone]    Special Requests:     Placed in box for: [name]

## 2020-09-17 NOTE — Telephone Encounter (Signed)
Incoming paperwork for completion to the [facility]  Date Received: 09/17/2020    Type of paperwork (sender:form type): Nurse Core ill fitting vest    Completed paperwork to be sent to:  Fax to [phone]    Special Requests:     Placed in box for: [name]

## 2020-09-17 NOTE — Telephone Encounter (Signed)
So had these ordered but I guess they want Fastclix lancets too???

## 2020-09-17 NOTE — Telephone Encounter (Signed)
Paperwork completed 09/17/2020  Sent as outlined in prior documentation

## 2020-09-21 ENCOUNTER — Telehealth: Payer: Self-pay | Admitting: Pediatrics

## 2020-09-21 NOTE — Telephone Encounter (Signed)
Paperwork completed 09/21/2020  Sent as outlined in prior documentation

## 2020-09-21 NOTE — Telephone Encounter (Signed)
Incoming paperwork for completion to the [facility]  Date Received: 09/21/2020    Type of paperwork (sender:form type): [employer] telephone orders eardrum    Completed paperwork to be sent to:  Fax to [phone]    Special Requests:     Placed in box for: [name]

## 2020-09-21 NOTE — Telephone Encounter (Signed)
[name] RN walked into [facility] office stating this Rx has not been received to the facility it was faxed to.

## 2020-09-22 ENCOUNTER — Encounter: Payer: Self-pay | Admitting: Gastroenterology

## 2020-09-23 ENCOUNTER — Telehealth: Payer: Self-pay | Admitting: Pediatrics

## 2020-09-23 ENCOUNTER — Other Ambulatory Visit: Payer: Self-pay | Admitting: Pediatrics

## 2020-09-23 NOTE — Telephone Encounter (Signed)
Fax: [phone]

## 2020-09-23 NOTE — Telephone Encounter (Signed)
Incoming paperwork for completion to the [facility]  Date Received: 09/23/2020    Type of paperwork (sender:form type): NurseCore Correction per neurology    Completed paperwork to be sent to:  Fax to [phone]    Special Requests:     Placed in box for: [name]

## 2020-09-27 NOTE — Telephone Encounter (Signed)
Paperwork completed 09/27/2020  Sent as outlined in prior documentation

## 2020-09-29 ENCOUNTER — Encounter: Payer: Self-pay | Admitting: Gastroenterology

## 2020-10-01 ENCOUNTER — Other Ambulatory Visit: Payer: Self-pay | Admitting: Pediatrics

## 2020-10-05 NOTE — Telephone Encounter (Signed)
Paperwork completed 10/05/2020  Sent as outlined in prior documentation

## 2020-10-06 ENCOUNTER — Ambulatory Visit: Payer: Medicare Other | Admitting: Endocrinology

## 2020-10-06 NOTE — Telephone Encounter (Signed)
A user error has taken place: encounter opened in error, closed for administrative reasons.

## 2020-10-08 ENCOUNTER — Telehealth: Payer: Self-pay | Admitting: Pediatrics

## 2020-10-08 NOTE — Telephone Encounter (Signed)
Incoming paperwork for completion to the [facility]  Date Received: 10/08/2020    Type of paperwork (sender:form type): fax from [facility] reporting missed home nurse visits.    Completed paperwork to be sent to:  Fax to [phone]    Special Requests: provider review and signature    Placed in box for: [name]

## 2020-10-11 ENCOUNTER — Encounter: Payer: Self-pay | Admitting: Gastroenterology

## 2020-10-11 ENCOUNTER — Telehealth: Payer: Self-pay | Admitting: Pediatrics

## 2020-10-11 DIAGNOSIS — S069XAA Unspecified intracranial injury with loss of consciousness status unknown, initial encounter: Secondary | ICD-10-CM

## 2020-10-11 DIAGNOSIS — J9600 Acute respiratory failure, unspecified whether with hypoxia or hypercapnia: Secondary | ICD-10-CM

## 2020-10-11 DIAGNOSIS — J9611 Chronic respiratory failure with hypoxia: Secondary | ICD-10-CM

## 2020-10-11 DIAGNOSIS — Z9911 Dependence on respirator [ventilator] status: Secondary | ICD-10-CM

## 2020-10-11 DIAGNOSIS — S069X9A Unspecified intracranial injury with loss of consciousness of unspecified duration, initial encounter: Secondary | ICD-10-CM

## 2020-10-11 NOTE — Telephone Encounter (Signed)
Nurse Jacques Earthly dropped off prescription orders for patient.  Paperwork includes fax number to where paperwork needs to be sent. One order is for oximeter with 4 sensors/month, and tapes to attach sensors to fingertip or toes; and fax to [phone].  The other order is for vitamin C gummies daily 282 mg each; and fax to [phone].

## 2020-10-11 NOTE — Telephone Encounter (Signed)
Paperwork completed 10/11/2020  Sent as outlined in prior documentation

## 2020-10-11 NOTE — Telephone Encounter (Signed)
Incoming paperwork for completion to the [facility]  Date Received: 10/11/2020    Type of paperwork (sender:form type): Prescription order for pulse oximeter/letter.    Completed paperwork to be sent to:  Fax to Attn Annetta at [facility] [phone]    Special Requests:     Placed in box for: Angel Wells

## 2020-10-11 NOTE — Telephone Encounter (Signed)
Incoming paperwork for completion to the [facility]  Date Received: 10/11/2020    Type of paperwork (sender:form type): NurseCore "telephone Order"    Completed paperwork to be sent to:  Fax to [phone] Kristine Garbe RN    Special Requests:     Placed in box for: [name]

## 2020-10-12 ENCOUNTER — Encounter: Payer: Self-pay | Admitting: Gastroenterology

## 2020-10-12 ENCOUNTER — Telehealth: Payer: Self-pay | Admitting: Pediatrics

## 2020-10-12 NOTE — Telephone Encounter (Signed)
Incoming paperwork for completion to the [facility]  Date Received: 10/12/2020    Type of paperwork (sender:form type): request for vitamin C gummies rx    Completed paperwork to be sent to:  Fax to [phone]    Special Requests:     Placed in box for: [name]

## 2020-10-13 NOTE — Telephone Encounter (Signed)
Paperwork completed 10/13/2020  Sent as outlined in prior documentation

## 2020-10-14 ENCOUNTER — Telehealth: Payer: Self-pay | Admitting: Pediatrics

## 2020-10-14 ENCOUNTER — Encounter: Payer: Self-pay | Admitting: Gastroenterology

## 2020-10-14 ENCOUNTER — Other Ambulatory Visit
Admission: RE | Admit: 2020-10-14 | Discharge: 2020-10-14 | Disposition: A | Discharge status: Home / Self Care | Payer: Medicare Other | Source: Ambulatory Visit

## 2020-10-14 LAB — AEROBIC CULTURE

## 2020-10-14 NOTE — Telephone Encounter (Signed)
Incoming paperwork for completion to the [facility]  Date Received: 10/14/2020    Type of paperwork (sender:form type): NurseCore cx of SN review and sign    Completed paperwork to be sent to:  Fax to [phone]    Special Requests:     Placed in box for: [name]

## 2020-10-15 ENCOUNTER — Telehealth: Payer: Self-pay | Admitting: Pediatrics

## 2020-10-15 NOTE — Telephone Encounter (Signed)
[facility] TELEPHONE INTAKE    Reason for call: Nurse [name] is requesting verbal order for Angel Wells  Tests to be done four times daily for the next two days. Also wanting an update on the changing of Angel vest size. She is requesting a call back    Name of caller: [name]  Relationship to patient: nurse  Organization (if applicable):   Phone:  [phone]  Fax (if applicable):

## 2020-10-15 NOTE — Telephone Encounter (Signed)
Paperwork completed 10/15/2020  Sent as outlined in prior documentation

## 2020-10-19 ENCOUNTER — Other Ambulatory Visit
Admission: RE | Admit: 2020-10-19 | Discharge: 2020-10-19 | Disposition: A | Discharge status: Home / Self Care | Payer: Medicare Other | Source: Ambulatory Visit | Attending: Pediatrics | Admitting: Pediatrics

## 2020-10-19 DIAGNOSIS — R509 Fever, unspecified: Secondary | ICD-10-CM | POA: Insufficient documentation

## 2020-10-19 DIAGNOSIS — R69 Illness, unspecified: Secondary | ICD-10-CM | POA: Insufficient documentation

## 2020-10-19 LAB — GRAM STAIN

## 2020-10-20 ENCOUNTER — Telehealth: Payer: Self-pay | Admitting: Pediatrics

## 2020-10-20 ENCOUNTER — Other Ambulatory Visit: Payer: Self-pay | Admitting: Pediatrics

## 2020-10-20 DIAGNOSIS — N3946 Mixed incontinence: Secondary | ICD-10-CM

## 2020-10-20 NOTE — Addendum Note (Signed)
Addended by: Midge Minium on: 10/20/2020 04:15 PM     Modules accepted: Orders

## 2020-10-20 NOTE — Telephone Encounter (Signed)
[facility] TELEPHONE INTAKE    Reason for call: [name] (Nurse) would like to know what recommendations will be from blood work that was completed yesterday 10/19/2020. [name] is requesting a call back    Name of caller: [name]  Relationship to patient: Nurse  Organization (if applicable):   Phone:  [phone]

## 2020-10-20 NOTE — Telephone Encounter (Signed)
His gram stain shows that he has some bacteria in the sputum but this is likely things that he has been colonized with as the sputum had <10PMN so i'm not concerned for infection at this time. Please let them know he will need to repeat urine sample as there was sputum in the cup

## 2020-10-20 NOTE — Telephone Encounter (Signed)
Called and left message reviewing the below message on [employer]. If she calls back please see if nurse is avail.

## 2020-10-20 NOTE — Telephone Encounter (Signed)
[facility] TELEPHONE INTAKE    Reason for call: Caller states she received urine sample but has sputum in it.  Lab tech asking that urine needs to be recollected and reorder labs. Please call if more info is needed.    Name of caller: Lyla Son  Relationship to patient: Lab [employer] (if applicable): [facility]  Phone:  [phone]  Fax (if applicable): [phone]

## 2020-10-21 ENCOUNTER — Other Ambulatory Visit: Payer: Self-pay | Admitting: Pediatrics

## 2020-10-21 DIAGNOSIS — Z931 Gastrostomy status: Secondary | ICD-10-CM

## 2020-10-21 DIAGNOSIS — G40909 Epilepsy, unspecified, not intractable, without status epilepticus: Secondary | ICD-10-CM

## 2020-10-21 LAB — AEROBIC CULTURE

## 2020-10-21 NOTE — Telephone Encounter (Signed)
Incoming call from [place] at [place] requesting five, 36ml syringes a month for pt's Mickey.    Writer unable to [employer].    Routed to PCP

## 2020-10-22 LAB — LEGIONELLA CULTURE: Legionella Culture: 0

## 2020-10-22 MED ORDER — ACCU-CHEK FASTCLIX LANCETS MISC
5 refills | Status: DC
Start: 2020-10-22 — End: 2021-05-11

## 2020-10-22 MED ORDER — SYRINGE DISPOSABLE 60 ML MISC *A*
5 refills | Status: DC
Start: 2020-10-22 — End: 2020-12-30

## 2020-10-22 MED ORDER — SYRINGE DISPOSABLE 60 ML MISC *A*
5 refills | Status: DC
Start: 2020-10-22 — End: 2020-10-22

## 2020-10-22 NOTE — Telephone Encounter (Signed)
Routing to PCP for new rx of 97ml syringes for pt's Mickey and requested 5 a month.    Routing to PCP for new rx.     Once new rx is signed, PSS will fax to [facility] at [phone].    To Lie.

## 2020-10-22 NOTE — Addendum Note (Signed)
Addended by: Midge Minium on: 10/22/2020 04:39 AM     Modules accepted: Orders

## 2020-10-22 NOTE — Telephone Encounter (Signed)
Spoke to [employer]	[place], Charity fundraiser.  No longer is needed.  Will complete encounter.

## 2020-10-22 NOTE — Telephone Encounter (Signed)
Spoke to [place], Charity fundraiser.  Needs syringe script sent to:  [facility]  Fax #: [phone]    [facility] told [name] to send it there.  Unable to find in pharmacy directory.  Will pend to PCP.

## 2020-10-22 NOTE — Addendum Note (Signed)
Addended by: Blossom Hoops on: 10/22/2020 09:38 AM     Modules accepted: Orders

## 2020-10-22 NOTE — Telephone Encounter (Signed)
Spoke to [place], Charity fundraiser.  Unsure why urine was needed.  Never originally obtained a urine sample.  Incr secretion over the last 10 days.  Yesterday (4/14) was day 1 of tobi 28 days on.  Otherwise doing well.    Plan to wait over weekend and see if symptoms improve or worsen.  [name] uses otoscope prior to debrox ear drops.    Needs LPN orders signed that she dropped off.  [name] inquired if her making these small changes are allowed.  Ie, suppositories to every other day (rather than every 3 days).

## 2020-10-22 NOTE — Addendum Note (Signed)
Addended by: Midge Minium on: 10/22/2020 03:34 PM     Modules accepted: Orders

## 2020-10-25 ENCOUNTER — Other Ambulatory Visit: Payer: Self-pay | Admitting: Pediatrics

## 2020-10-26 MED ORDER — BISACODYL 10 MG RE SUPP *I*
RECTAL | 0 refills | Status: DC
Start: 2020-10-26 — End: 2020-12-21

## 2020-10-26 MED ORDER — DOCUSATE SODIUM 10 MG/ML PO LIQD *I*
100.0000 mg | Freq: Two times a day (BID) | ORAL | 5 refills | Status: DC
Start: 2020-10-26 — End: 2021-11-03

## 2020-10-26 NOTE — Telephone Encounter (Signed)
LVM with [name], RN to see how to [name] did over the weekend.

## 2020-10-26 NOTE — Addendum Note (Signed)
Addended by: Jaymes Graff on: 10/26/2020 09:45 AM     Modules accepted: Orders

## 2020-10-26 NOTE — Telephone Encounter (Signed)
[name] (Nurse) states Docusate Sodium (SILACE) 150 MG/15ML syrup should be 3x daily and not 2x daily as this is the pts maintenance. [name] also states bisacodyl (DULCOLAX) 10 mg suppository should be every 2 days as needed instead of every 3 days. Please advise

## 2020-10-27 MED ORDER — DOCUSATE SODIUM 10 MG/ML PO LIQD *I*
100.0000 mg | Freq: Three times a day (TID) | ORAL | 5 refills | Status: AC
Start: 2020-10-27 — End: 2021-04-25

## 2020-10-27 MED ORDER — PULSE OXIMETER MISC *A*
0 refills | Status: DC
Start: 2020-10-27 — End: 2020-12-30

## 2020-10-27 MED ORDER — GENERIC DME *A*
5 refills | Status: DC
Start: 2020-10-27 — End: 2022-06-23

## 2020-10-27 NOTE — Telephone Encounter (Signed)
Received incoming call from [place], [place].  Needs colace switched from BID to TID.  Pended in separate encounter.    Relayed that [name] has been doing much better.  Denies temps.  Secretions are minimal, pale in color (not clear).  No acute concerns.

## 2020-10-27 NOTE — Addendum Note (Signed)
Addended by: Blossom Hoops on: 10/27/2020 08:48 AM     Modules accepted: Orders

## 2020-10-28 ENCOUNTER — Other Ambulatory Visit: Payer: Self-pay | Admitting: Pediatrics

## 2020-10-28 DIAGNOSIS — E1165 Type 2 diabetes mellitus with hyperglycemia: Secondary | ICD-10-CM

## 2020-10-28 DIAGNOSIS — Z794 Long term (current) use of insulin: Secondary | ICD-10-CM

## 2020-10-28 NOTE — Telephone Encounter (Signed)
Received a fax from [facility] requesting clarification on docusate sodium (COLACE) 10 mg/mL liquid.

## 2020-10-28 NOTE — Telephone Encounter (Signed)
Called and clarified with [facility] pharmacy that order is for TID.

## 2020-10-29 ENCOUNTER — Telehealth: Payer: Self-pay | Admitting: Pediatrics

## 2020-10-29 NOTE — Telephone Encounter (Signed)
[facility]  Prior Authorization Request    Date of request: 10/29/2020  Time of call: 1:41 PM    Medication Requiring PA: docusate sodium (COLACE) 10 mg/mL liquid  Directions(dosing and frequency): Take 10 mLs (100 mg total) by mouth 3 times daily HOld for loose stool  Disp-900 mL, R-5, Normal  Quantity/ Day Supply: Take 10 mLs (100 mg total) by mouth 3 times daily HOld for loose stool  Disp-900 mL, R-5, Normal  Dx code: R19.7    Request received from:CCC PA Source: Pharmacy  Name [phone]      Additional Pharmacy Benefit plan: no  Insurance coverage confirmed: yes  Covered Alternative Medication:     Tax ID: [id number]  Provider and NPI: Lie- [id number]    Additional Justification:n/a  Key ID:     Please provide justification for PA submission & urgency or indicate which covered alternative was ordered

## 2020-11-01 ENCOUNTER — Telehealth: Payer: Self-pay | Admitting: Pediatrics

## 2020-11-01 NOTE — Telephone Encounter (Signed)
Attempted to call [name], RN back.  No answer- LVM to call clinic back.    Leonard Schwartz, RN

## 2020-11-01 NOTE — Telephone Encounter (Signed)
Spoke with pharmacy and was able to get script covered through [facility]

## 2020-11-01 NOTE — Telephone Encounter (Signed)
[facility] TELEPHONE TRIAGE     Reason for call: Angel Wells requested to speak to a nurse regarding the results of recent lab work. Angel Wells inquired if the patients provider will treat the patient for increased pseudomonas.     Name of caller: Angel Wells for [name]  Relationship to patient: RN  Organization: Nurse Core  Phone: [phone]

## 2020-11-02 ENCOUNTER — Telehealth: Payer: Self-pay | Admitting: Pediatrics

## 2020-11-02 NOTE — Telephone Encounter (Signed)
Incoming paperwork for completion to the [facility]  Date Received: 11/02/2020    Type of paperwork (sender:form type): NurseCore - Missed Visit Note    Completed paperwork to be sent to:  Fax to NurseCore Attn: Victorino December [phone]    Special Requests:     Placed in box for: [name]

## 2020-11-03 ENCOUNTER — Other Ambulatory Visit: Payer: Self-pay | Admitting: Pediatrics

## 2020-11-03 DIAGNOSIS — J4540 Moderate persistent asthma, uncomplicated: Secondary | ICD-10-CM

## 2020-11-04 ENCOUNTER — Ambulatory Visit: Payer: Medicare Other | Admitting: Optometry

## 2020-11-04 NOTE — Telephone Encounter (Signed)
[facility]  Prior Authorization Request    Date of request: 11/04/2020  Time of call: 11:59 AM    Medication Requiring PA: budesonide (PULMICORT) 0.25 mg/21mL nebulizer solution  Directions(dosing and frequency): INHALE 2 MLS (0.25 MG TOTAL) BY NEBULIZATION 2 TIMES DAILY FOR ASTHMA  Disp-360 mL, R-1, Normal  Quantity/ Day Supply: INHALE 2 MLS (0.25 MG TOTAL) BY NEBULIZATION 2 TIMES DAILY FOR ASTHMA  Disp-360 mL, R-1, Normal  Dx code: J45.909      Request received from:CCC PA Source: Pharmacy  Name [phone]      Additional Pharmacy Benefit plan: no  Insurance coverage confirmed: yes  Covered Alternative Medication:   Secondary Insurance: Medicaid    Tax ID: [id number]  Provider and NPI: [name]- [id number]    Additional Justification:n/a  Key ID:     Please provide justification for PA submission & urgency or indicate which covered alternative was ordered

## 2020-11-05 NOTE — Telephone Encounter (Signed)
Paperwork completed 11/05/2020  Sent as outlined in prior documentation

## 2020-11-08 ENCOUNTER — Telehealth: Payer: Self-pay | Admitting: Pediatrics

## 2020-11-08 NOTE — Telephone Encounter (Signed)
Script is now covered under Part B

## 2020-11-08 NOTE — Telephone Encounter (Signed)
Incoming paperwork for completion to the [facility]  Date Received: 11/08/2020    Type of paperwork (sender:form type): [facility]    Completed paperwork to be sent to:  N/a - script will be sent electronically.    Special Requests:     Placed in box for: [name]

## 2020-11-08 NOTE — Telephone Encounter (Signed)
[facility] TELEPHONE INTAKE    Reason for call:Pharmacist called states Medicare B insurance went through processing refills now.  Please advise patient.

## 2020-11-08 NOTE — Telephone Encounter (Signed)
Spoke with pharmacy and script was rejected under all insurances. Any alternatives?

## 2020-11-08 NOTE — Telephone Encounter (Signed)
Incoming paperwork for completion to the [facility]  Date Received: 11/08/2020    Type of paperwork (sender:form type): missed visit notifications for 4/26 - 4/27    Completed paperwork to be sent to:  Fax to [phone]    Special Requests:     Placed in box for: [name]

## 2020-11-09 ENCOUNTER — Other Ambulatory Visit: Payer: Self-pay | Admitting: Pediatrics

## 2020-11-09 ENCOUNTER — Telehealth: Payer: Self-pay | Admitting: Pediatrics

## 2020-11-09 ENCOUNTER — Encounter: Payer: Self-pay | Admitting: Gastroenterology

## 2020-11-09 DIAGNOSIS — J4540 Moderate persistent asthma, uncomplicated: Secondary | ICD-10-CM

## 2020-11-09 MED ORDER — IPRATROPIUM BROMIDE 0.02 % IN SOLN *I*
500.0000 ug | Freq: Two times a day (BID) | RESPIRATORY_TRACT | 5 refills | Status: DC
Start: 2020-11-09 — End: 2020-12-23

## 2020-11-09 NOTE — Telephone Encounter (Signed)
Paperwork completed 11/09/2020  Sent as outlined in prior documentation

## 2020-11-09 NOTE — Telephone Encounter (Signed)
Incoming paperwork for completion to the [facility]  Date Received: 11/09/2020    Type of paperwork (sender:form type): home care orders   - nurse also requesting to increase pt's flonase to twice a day   -nurse asking if any changes are needed to pt's bg protocol.    Completed paperwork to be sent to:  Fax to [phone]    Special Requests:     Placed in box for: [name]

## 2020-11-11 ENCOUNTER — Encounter: Payer: Self-pay | Admitting: Gastroenterology

## 2020-11-17 ENCOUNTER — Telehealth: Payer: Self-pay | Admitting: Pediatrics

## 2020-11-17 NOTE — Telephone Encounter (Addendum)
Incoming paperwork for completion to the [facility]  Date Received: 11/17/2020    Type of paperwork (sender:form type): late home visit notiifcations for 5/3 and 5/4.    Completed paperwork to be sent to:  Fax to [phone]    Special Requests:     Placed in box for: scanning.

## 2020-11-22 ENCOUNTER — Telehealth: Payer: Self-pay | Admitting: Pediatrics

## 2020-11-22 NOTE — Telephone Encounter (Signed)
Incoming paperwork for completion to the [facility]  Date Received: 11/22/2020    Type of paperwork (sender:form type): missed appts notification    Completed paperwork to be sent to:  Fax to [phone]    Special Requests:     Placed in box for: [name]

## 2020-11-23 ENCOUNTER — Telehealth: Payer: Self-pay | Admitting: Pediatrics

## 2020-11-23 ENCOUNTER — Encounter: Payer: Self-pay | Admitting: Gastroenterology

## 2020-11-23 NOTE — Telephone Encounter (Signed)
Paperwork completed 11/23/2020  Sent as outlined in prior documentation

## 2020-11-23 NOTE — Telephone Encounter (Signed)
[facility]  Prior Authorization Request    Date of request: 11/23/2020  Time of call: 11:31 AM    Medication Requiring PA: ipratropium (ATROVENT) 0.02 % nebulizer solution   Directions(dosing and frequency): Inhale 2.5 mLs (500 mcg total) by nebulization 2 times daily for Asthma Dx: J45.4 - Nebulization  Quantity/ Day Supply: 125 ML/30  Dx code: J45.4     Request received from:CCC PA Source: Pharmacy  [facility] and [phone]   [phone]    Please run secondary if not covered under primary: yes     Insurance coverage confirmed: yes  Covered Alternative Medication: N/a    Tax ID: [id number]  Provider and NPILorenz Coaster- [id number]    Please provide justification for PA submission & urgency or indicate which covered alternative was ordered.

## 2020-11-26 ENCOUNTER — Telehealth: Payer: Self-pay | Admitting: Pediatrics

## 2020-11-26 ENCOUNTER — Encounter: Payer: Self-pay | Admitting: Gastroenterology

## 2020-11-26 NOTE — Telephone Encounter (Signed)
Incoming paperwork for completion to the [facility]  Date Received: 11/26/2020    Type of paperwork (sender:form type): missed visits notifications for 5/17 and 5/18 - parents covered the hour gap.      Completed paperwork to be sent to:  Fax to [phone]    Special Requests:     Placed in box for: [name]

## 2020-11-26 NOTE — Telephone Encounter (Signed)
Incoming paperwork for completion to the [facility]  Date Received: 11/26/2020    Type of paperwork (sender:form type): [facility] Certification and Plan of Care    Completed paperwork to be sent to:  Fax to [phone]    Special Requests:     Placed in box for: [name]

## 2020-11-26 NOTE — Telephone Encounter (Signed)
Dr. [name] do you still have these orders?  They are looking for them.  I did have them re fax it as well.

## 2020-11-29 ENCOUNTER — Encounter: Payer: Self-pay | Admitting: Gastroenterology

## 2020-11-29 ENCOUNTER — Other Ambulatory Visit: Payer: Self-pay | Admitting: Neurology

## 2020-11-29 DIAGNOSIS — G40219 Localization-related (focal) (partial) symptomatic epilepsy and epileptic syndromes with complex partial seizures, intractable, without status epilepticus: Secondary | ICD-10-CM

## 2020-11-30 ENCOUNTER — Telehealth: Payer: Self-pay | Admitting: Neurology

## 2020-11-30 NOTE — Telephone Encounter (Signed)
I received a prior authorization request through [email or web address] from [facility] Medicare for Mr. Dalesandro lacosamide prescription.  I completed and submitted the form and received a response indicating that prior authorization is not required.

## 2020-12-01 ENCOUNTER — Encounter: Payer: Self-pay | Admitting: Gastroenterology

## 2020-12-01 NOTE — Telephone Encounter (Signed)
Paperwork completed 12/01/2020  Sent as outlined in prior documentation

## 2020-12-02 ENCOUNTER — Telehealth: Payer: Self-pay | Admitting: Pediatrics

## 2020-12-02 NOTE — Telephone Encounter (Signed)
Received incoming call from [facility] at [employer].  Requesting verbal order to have second nurse accompany her to visit [name] this morning.  [name] had recent injury to knee.  [facility] will pay for second nurse.  Will not bill [name]'s insurance.    Verbal order given.  No further actions needed.

## 2020-12-03 ENCOUNTER — Telehealth: Payer: Self-pay | Admitting: Pediatrics

## 2020-12-03 NOTE — Telephone Encounter (Signed)
Incoming paperwork for completion to the [facility]  Date Received: 12/03/2020    Type of paperwork: [facility] - Missed Visit Note    Completed paperwork to be sent to:  Fax to [facility] Attn: Victorino December [phone]    Special Requests:     Placed in box for: [name]

## 2020-12-03 NOTE — Telephone Encounter (Signed)
[facility] TELEPHONE TRIAGE     Reason for call: Writer spoke with [name] who wants to report that patient has a large amount of drainage from nose but it is clear and does have allergies.  [name] is asking if she can increase the flonase to 2 times daily?  Patients O2 level dropped to 84 she had a hard time getting it back up she did all the things you are supposed too.  She is asking that his budesonide be ordered as prn cause she had to give it to him to get level back up?  Please call [name] back     Name of caller: [name]  Phone:  [phone]  Or [phone]

## 2020-12-03 NOTE — Telephone Encounter (Signed)
Incoming paperwork for completion to the [facility]  Date Received: 12/03/2020    Type of paperwork (sender:form type): [facility] order form     Completed paperwork to be sent to:  Fax to [phone]    Special Requests:     Placed in box for: [name]

## 2020-12-03 NOTE — Telephone Encounter (Signed)
Left message to call office and ask to speak with nurse.    If pt nurse calls back please see if phone nurse is available, if not please notify pt that nurse will call them back.

## 2020-12-07 ENCOUNTER — Encounter: Payer: Self-pay | Admitting: Gastroenterology

## 2020-12-07 NOTE — Telephone Encounter (Signed)
Paperwork completed 12/07/2020  Sent as outlined in prior documentation

## 2020-12-08 NOTE — Telephone Encounter (Signed)
Paperwork completed 12/08/2020  Sent as outlined in prior documentation

## 2020-12-09 NOTE — Telephone Encounter (Addendum)
Ok to increase flonase to BID and add budesonide as a PRN     Spoke to [employer], Charity fundraiser.  Has not heard from [name]'s house.  Typically a good sign.  [name] has more allergies in this season.  No acute concerns.    Angel Wells will call office if [name] needs further care.  Acknowledged understanding.

## 2020-12-14 ENCOUNTER — Telehealth: Payer: Self-pay | Admitting: Pediatrics

## 2020-12-14 ENCOUNTER — Other Ambulatory Visit: Payer: Self-pay | Admitting: Pediatrics

## 2020-12-14 DIAGNOSIS — K219 Gastro-esophageal reflux disease without esophagitis: Secondary | ICD-10-CM

## 2020-12-14 NOTE — Telephone Encounter (Signed)
Incoming paperwork for completion to the [facility]  Date Received: 12/14/2020    Type of paperwork (sender:form type): [facility] Revision to Plan Care     Completed paperwork to be sent to:  Fax to [phone]    Special Requests:     Placed in box for: [name]

## 2020-12-16 ENCOUNTER — Encounter: Payer: Self-pay | Admitting: Gastroenterology

## 2020-12-16 NOTE — Telephone Encounter (Signed)
Paperwork completed 12/16/2020  Sent as outlined in prior documentation

## 2020-12-20 ENCOUNTER — Other Ambulatory Visit: Payer: Self-pay | Admitting: Pediatrics

## 2020-12-21 ENCOUNTER — Ambulatory Visit: Payer: Medicare Other | Attending: Neurology | Admitting: Neurology

## 2020-12-21 ENCOUNTER — Encounter: Payer: Self-pay | Admitting: Gastroenterology

## 2020-12-21 DIAGNOSIS — G40219 Localization-related (focal) (partial) symptomatic epilepsy and epileptic syndromes with complex partial seizures, intractable, without status epilepticus: Secondary | ICD-10-CM | POA: Insufficient documentation

## 2020-12-21 NOTE — Progress Notes (Signed)
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  Meeghan (? sp), one of his nurses, accompanied him today.  I last saw him on 06/15/2020.  I transcribed the history of present illness below during my discussion with Angel Wells and his nurse, though it is not written verbatim.    HISTORY OF PRESENT ILLNESS:  He told me that he doesn't know what is up.  His seizures are improved since he was last year.  They have had a couple of months when he had no seizures at all.  He has had no seizures in June so far.  His last seizure was in April, and he had a total of two brief seizures that month. Otherwise his general health has been good.  He is receiving lacosamide 10 mg/mL 10 mL per tube twice daily, levetiracetam 100 mg/mL 15 mL per tube twice daily, and valproate 50 mg/mL 5 mL per tube three times daily.There is no clear interest in raising his dose of lacosamide at this point.  There are no concerns to be addressed at this time.      EXAMINATION:  No formal examination was performed.  Mr. Gellner was awake and alert, and he seemed to be in good spirits.       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  His seizure frequently is significantly diminished with the addition and titration of lacosamide.  He seems to tolerate his current three medication well.  We discussed the potential to increase his lacosamide further. I explained that the goals of seizure disorder management are to have no seizures and to have no adverse medication effects.  His nurse told me that there is no interest in making a medication change at this time.  I asked that his father or care team let me know if they would like to try increasing his lacosamide.      PLAN:   He will continue lacosamide 10 mg/mL 10 mL per tube twice  daily, levetiracetam 100 mg/mL 15 mL per tube twice daily, and valproate 50 mg/mL 5 mL per tube three times daily.   He should have a repeat comprehensive metabolic profile, CBC, and valproic acid done in the fall.  His last blood work was done in November 2021, and these labs should be checked at least yearly.   I will see him back in one year.  I would be happy to see him sooner if needed.     Azucena Kuba, MD  Assoc. Professor of Clinical Neurology  Pager: [phone]  Office phone: [phone]  Office fax: [phone]  12/21/2020 12:45 PM

## 2020-12-22 ENCOUNTER — Telehealth: Payer: Self-pay | Admitting: Pediatrics

## 2020-12-22 ENCOUNTER — Encounter: Payer: Self-pay | Admitting: Gastroenterology

## 2020-12-22 ENCOUNTER — Encounter: Payer: Self-pay | Admitting: Pediatrics

## 2020-12-22 NOTE — Telephone Encounter (Signed)
Incoming paperwork for completion to the [facility]    Date Received: 12/22/2020    Type of paperwork (sender:form type):   1. Mickey button flush order - fax to [facility]  2. Order requesting the following supplies:   -Pulse oximeter machine and finger probes  (4/month)   -20mL catheter tip syringes for gravity  administration of medications and hydration  (5/month)   -1" paper tape for mickey dressing change    (1/month)  **along with face sheet, chart notes, and prescriptions w dx codes to be faxed to [phone] ([facility])    3. Order requesting a portable suction machine, canisters (4/month), and tubing (2 short/month and 2 long/month) faxed to [facility] @ 1 [phone], attn Annetta.    Completed paperwork to be sent to: fax to [phone] - [facility]    Special Requests:     Placed in box for: [name]

## 2020-12-23 ENCOUNTER — Encounter: Payer: Self-pay | Admitting: Gastroenterology

## 2020-12-23 ENCOUNTER — Other Ambulatory Visit: Payer: Self-pay | Admitting: Primary Care

## 2020-12-23 ENCOUNTER — Telehealth: Payer: Self-pay | Admitting: Pediatrics

## 2020-12-23 DIAGNOSIS — J4540 Moderate persistent asthma, uncomplicated: Secondary | ICD-10-CM

## 2020-12-23 NOTE — Telephone Encounter (Signed)
Incoming paperwork for completion to the [facility]  Date Received: 12/23/2020    Type of paperwork (sender:form type): increase flonase phys order    Completed paperwork to be sent to:  Fax to [phone]    Special Requests:     Placed in box for: [name]

## 2020-12-23 NOTE — Telephone Encounter (Signed)
Incoming paperwork for completion to the [facility]  Date Received: 12/23/2020    Type of paperwork (sender:form type): [facility] order form     Completed paperwork to be sent to:  Fax to [phone]    Special Requests:     Placed in box for: Lie

## 2020-12-23 NOTE — Telephone Encounter (Signed)
Paperwork completed 12/23/2020  Sent as outlined in prior documentation

## 2020-12-30 ENCOUNTER — Telehealth: Payer: Self-pay | Admitting: Pediatrics

## 2020-12-30 ENCOUNTER — Other Ambulatory Visit
Admission: RE | Admit: 2020-12-30 | Discharge: 2020-12-30 | Disposition: A | Discharge status: Home / Self Care | Payer: Medicare Other | Source: Ambulatory Visit | Attending: Pediatrics | Admitting: Pediatrics

## 2020-12-30 DIAGNOSIS — G40909 Epilepsy, unspecified, not intractable, without status epilepticus: Secondary | ICD-10-CM

## 2020-12-30 DIAGNOSIS — J9600 Acute respiratory failure, unspecified whether with hypoxia or hypercapnia: Secondary | ICD-10-CM

## 2020-12-30 DIAGNOSIS — J9602 Acute respiratory failure with hypercapnia: Secondary | ICD-10-CM

## 2020-12-30 DIAGNOSIS — J9611 Chronic respiratory failure with hypoxia: Secondary | ICD-10-CM

## 2020-12-30 DIAGNOSIS — Z931 Gastrostomy status: Secondary | ICD-10-CM

## 2020-12-30 DIAGNOSIS — S069X9A Unspecified intracranial injury with loss of consciousness of unspecified duration, initial encounter: Secondary | ICD-10-CM

## 2020-12-30 DIAGNOSIS — R0902 Hypoxemia: Secondary | ICD-10-CM | POA: Insufficient documentation

## 2020-12-30 DIAGNOSIS — Z9911 Dependence on respirator [ventilator] status: Secondary | ICD-10-CM

## 2020-12-30 DIAGNOSIS — Z930 Tracheostomy status: Secondary | ICD-10-CM

## 2020-12-30 DIAGNOSIS — S069XAA Unspecified intracranial injury with loss of consciousness status unknown, initial encounter: Secondary | ICD-10-CM

## 2020-12-30 LAB — GRAM STAIN

## 2020-12-30 MED ORDER — GENERIC DME *A*
5 refills | Status: DC
Start: 2020-12-30 — End: 2022-06-23

## 2020-12-30 MED ORDER — SUCTION MACHINE DEVI *A*
0 refills | Status: DC
Start: 2020-12-30 — End: 2022-06-23

## 2020-12-30 NOTE — Telephone Encounter (Signed)
Writer spoke with Angel Wells for patient whop needs these 3 rx printed and faxed to [facility],  Along with a LOMN, face sheet and chart notes.     All needs to be faxed to [phone]

## 2020-12-30 NOTE — Telephone Encounter (Signed)
Writer spoke with [name] R for patient who needs these 3 Rx's sent to [facility] Attn: Annetta fax # is [phone]  Along with LOMN, chart notes, and facesheet.

## 2020-12-31 ENCOUNTER — Encounter: Payer: Self-pay | Admitting: Gastroenterology

## 2020-12-31 MED ORDER — SYRINGE DISPOSABLE 60 ML MISC *A*
5 refills | Status: DC
Start: 2020-12-31 — End: 2022-06-23

## 2020-12-31 MED ORDER — GENERIC DME *A*
5 refills | Status: DC
Start: 2020-12-31 — End: 2022-06-23

## 2020-12-31 MED ORDER — PULSE OXIMETER MISC *A*
0 refills | Status: DC
Start: 2020-12-31 — End: 2023-10-10

## 2021-01-01 ENCOUNTER — Telehealth: Payer: Self-pay | Admitting: Primary Care

## 2021-01-01 LAB — AEROBIC CULTURE

## 2021-01-01 LAB — LEGIONELLA CULTURE: Legionella Culture: 0

## 2021-01-01 NOTE — Telephone Encounter (Signed)
Received results regarding sputum containing PsA and staph arueus.   Called dad.  Oma is doing well. He has no respiratory symptoms. No oxygen changes, breathing difficulties, ventilator changes, fevers.  Dad does say he is junky in the morning, but this is no change from baseline. He suspects that nursing was very nervous about him.    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    Encouraged dad to call should they have any concerns.    [name], MD

## 2021-01-03 ENCOUNTER — Other Ambulatory Visit: Payer: Self-pay | Admitting: Pediatrics

## 2021-01-03 ENCOUNTER — Telehealth: Payer: Self-pay | Admitting: Pediatrics

## 2021-01-03 DIAGNOSIS — Z794 Long term (current) use of insulin: Secondary | ICD-10-CM

## 2021-01-03 DIAGNOSIS — E119 Type 2 diabetes mellitus without complications: Secondary | ICD-10-CM

## 2021-01-03 MED ORDER — SENNOSIDES 8.6 MG PO TABS *I*
2.0000 | ORAL_TABLET | Freq: Every day | ORAL | 1 refills | Status: DC
Start: 2021-01-03 — End: 2021-01-03

## 2021-01-03 MED ORDER — CETIRIZINE HCL 10 MG PO TABS *I*
10.0000 mg | ORAL_TABLET | Freq: Every evening | ORAL | 5 refills | Status: DC
Start: 2021-01-03 — End: 2022-01-18

## 2021-01-03 MED ORDER — SENNOSIDES 8.6 MG PO TABS *I*
2.0000 | ORAL_TABLET | Freq: Every day | ORAL | 5 refills | Status: DC
Start: 2021-01-03 — End: 2021-02-16

## 2021-01-03 MED ORDER — ACCU-CHEK AVIVA PLUS VI STRP *A*
ORAL_STRIP | 5 refills | Status: DC
Start: 2021-01-03 — End: 2021-07-18

## 2021-01-03 NOTE — Telephone Encounter (Signed)
[facility] TELEPHONE TRIAGE     Reason for call: [name] requested to confirm the patients cetirizine order is one tablet daily and one nightly PRN order.     Name of caller: [name] for Arris Meyn  Relationship to patient: RN  Phone: [phone]

## 2021-01-03 NOTE — Addendum Note (Signed)
Addended by: Midge Minium on: 01/03/2021 01:38 PM     Modules accepted: Orders

## 2021-01-03 NOTE — Telephone Encounter (Signed)
Writer has the scripts ready to fax but still looking for the CMN letter so I can send that as well.

## 2021-01-03 NOTE — Telephone Encounter (Signed)
Letter updated.

## 2021-01-03 NOTE — Telephone Encounter (Signed)
Writer has the 3 rx's ready to be faxed I just need the letter CMN regarding the 3 medications so I can fax to [facility]

## 2021-01-03 NOTE — Telephone Encounter (Signed)
Letter created

## 2021-01-03 NOTE — Telephone Encounter (Signed)
Incoming paperwork for completion to the [facility]  Date Received: 01/03/2021    Type of paperwork (sender:form type): [facility] Telephone Order Sputum Culture    Completed paperwork to be sent to:  Fax to [phone]    Special Requests:     Placed in box for: [name]

## 2021-01-04 NOTE — Telephone Encounter (Signed)
Paperwork completed 01/04/2021  Sent as outlined in prior documentation  Paperwork sent to [facility] [phone]

## 2021-01-04 NOTE — Telephone Encounter (Signed)
Paperwork completed 01/04/2021  Sent as outlined in prior documentation  Paperwork was faxed [phone]

## 2021-01-07 ENCOUNTER — Encounter: Payer: Self-pay | Admitting: Gastroenterology

## 2021-01-07 ENCOUNTER — Telehealth: Payer: Self-pay | Admitting: Pediatrics

## 2021-01-07 NOTE — Telephone Encounter (Signed)
Incoming paperwork for completion to the [facility]  Date Received: 01/07/2021    Type of paperwork (sender:form type): [facility] Home [facility] Certification and Plan of Care    Completed paperwork to be sent to:  Fax to [phone]    Special Requests:     Placed in box for: [name]

## 2021-01-11 ENCOUNTER — Telehealth: Payer: Self-pay | Admitting: Pediatrics

## 2021-01-11 NOTE — Telephone Encounter (Signed)
[facility] TELEPHONE INTAKE    Reason for call: Angel Wells called and said Angel Wells is out of his Vitamin C gummies and his Probiatic gummies. Dad is ordering them but will take a few days to arrive. Angel Wells just wanted to pass it along.

## 2021-01-14 ENCOUNTER — Other Ambulatory Visit: Payer: Self-pay | Admitting: Pediatrics

## 2021-01-14 DIAGNOSIS — J9611 Chronic respiratory failure with hypoxia: Secondary | ICD-10-CM

## 2021-01-14 MED ORDER — GENERIC DME *A*
0 refills | Status: DC
Start: 2021-01-14 — End: 2022-06-23

## 2021-01-14 NOTE — Telephone Encounter (Signed)
Can you give Angel Wells a verbal order for a PRN vest treatment for increase secretion while his vest is getting repaired.     You can give me the verbal and I can call Angel Wells.    Angel Wells # [phone]

## 2021-01-14 NOTE — Telephone Encounter (Signed)
Coughing assist machine was sent to   [facility]   [address] [place], [place] [address]  Fax [phone]

## 2021-01-14 NOTE — Telephone Encounter (Signed)
Incoming call from [place] - pt's nurse.  Cb# [phone]    [name] reported that pt's cough assist machine was sent to [place] for repairs and doesn't have an estimated date of return.    [name] said that since secretions will be increased, dad recommended that pt has an extra PRN vest treatment.     Rx for repairs will be needed, but [name] said she's still trying to find out where the machine was sent so we can send rx.    Routed to nursing.

## 2021-01-17 NOTE — Telephone Encounter (Signed)
Paperwork completed 01/17/2021  Sent as outlined in prior documentation

## 2021-01-17 NOTE — Telephone Encounter (Signed)
Writer called and LVM with [name] saying that Dr. [name] said we could give the verbal order for patient.  Also told [name] to call office with any other problems or questions.

## 2021-01-18 ENCOUNTER — Other Ambulatory Visit: Payer: Self-pay | Admitting: Pediatrics

## 2021-01-18 ENCOUNTER — Other Ambulatory Visit: Payer: Self-pay | Admitting: Primary Care

## 2021-01-18 DIAGNOSIS — E1165 Type 2 diabetes mellitus with hyperglycemia: Secondary | ICD-10-CM

## 2021-01-18 DIAGNOSIS — R569 Unspecified convulsions: Secondary | ICD-10-CM

## 2021-01-25 ENCOUNTER — Encounter: Payer: Self-pay | Admitting: Gastroenterology

## 2021-01-25 ENCOUNTER — Telehealth: Payer: Self-pay | Admitting: Primary Care

## 2021-01-25 NOTE — Telephone Encounter (Signed)
Patient's nurse called with a question about insulin administration.  He is on NovoLog 15 units before meals with a sliding scale starting at 200 mg/dL.  Blood sugar before dinner was 113 and she was concerned about giving his usual dose of insulin.  I reviewed his blood sugars for the last few days and he has been running from 128-232    I told her it is perfectly fine to give his usual dose of insulin.  Recommended she check her blood sugar 90 minutes after and call back if there is any concerns

## 2021-01-26 ENCOUNTER — Telehealth: Payer: Self-pay | Admitting: Pediatrics

## 2021-01-26 NOTE — Telephone Encounter (Signed)
Retruned call to kate, no answer, left voice mail with my number for call back if help is needed.

## 2021-01-26 NOTE — Telephone Encounter (Signed)
Please call patient to schedule video appointment with me and Angel Wells, he is overdue for followup

## 2021-01-26 NOTE — Telephone Encounter (Signed)
[facility] TELEPHONE INTAKE    Reason for call: nebulizer machine has died - needs a new one, talking to [facility] about getting a loaner for the day. in the meantime the respitory theripst told to run off the oxegen tank because he gets treatments every 4 hrs & they cannot wait. Pharmacy should be [facility] ph: [phone].    Name of caller: [name]   Relationship to patient: Nurse   Phone:  [phone]  Fax (if applicable):

## 2021-01-26 NOTE — Telephone Encounter (Signed)
Angel Wells returned call and they are OK using the tank and/or his concentrator until a new nebulizer machine is delivered. [facility] is working on it now and understands the urgency of replacement

## 2021-01-27 ENCOUNTER — Ambulatory Visit: Payer: Medicare Other | Admitting: Pediatrics

## 2021-01-27 ENCOUNTER — Encounter: Payer: Self-pay | Admitting: Pediatrics

## 2021-01-27 ENCOUNTER — Encounter: Payer: Self-pay | Admitting: Gastroenterology

## 2021-01-27 NOTE — Progress Notes (Deleted)
[facility] Annual Nutrition Support Assessment     Pt referred to RD for: enteral feeding   Indication for Nutrition Support:   Nutrition Related PMH: chronic respiratory failure, intractable focal epilepsy, spastic quadriplegia, type 2 diabetes      Allergies: NKFA    APPETITE: {APPETITE:30169}    How Fed: PO    PO DIET:   B: ***  S: ***  L: ***  S: ***  D: ***  S: ***  PO Supplements: {RD Supplements ZOX:09604}  Main beverages: ***  Food Prep/access to food: ***   Feeding ability: ***  % daily calories PO: ***  Pt eats PO only for quality of life and does not receive a significant amount of calories from PO intake: {YES NO:22742}    Tube Feeding Regimen and Equipment:  Tube type: G 14 French mickey button  Pump type: gets bolus hydration   DME: [facility]  Last Tube Change: February   Where is Tube Changed: at house  Formula: no formula  Flushes: 1.5L fluid total daily    Vitamins: vit 2 1000U daily, 2 gummy multivitamins daily, vit C 250mg  daily   Acid Reducer: Famotidine (Pepcid) calcium carbonate   Nutritionally relevant medications: Senna, dulcolax, lactulose, colace   Medications are taken: {RD Intake CCC:27886}    Type 2 diabetes   Insulin/DM meds: Metformin 500 mg two times a day and toujeo   At home BG: ***     ACTIVITY LEVEL:{DESC; CF ACTIVITY LEVEL:30179}  Mobility: {DESC; MOBILITY  Activities/exercise: ***    ANTHROPOMETRICS:  Wt Readings from Last 3 Encounters:   07/20/20 80.4 kg (177 lb 3.2 oz)   06/22/20 81.5 kg (179 lb 9.6 oz)   02/19/20 80 kg (176 lb 6.4 oz)     Estimated body mass index is 29.49 kg/m as calculated from the following:    Height as of 04/14/20: 1.651 m (5\' 5" ).    Weight as of 07/20/20: 80.4 kg (177 lb 3.2 oz).    Weight Status/Goal: Pt is overweight; gradual weight loss towards IBW desired.  IBW:  136#s %IBW: 130#  Adj BW: 68.9kg       NUTRITION RELATED LABS:  Last HA1c 6 months ago elevated at 7.9     Voiding Method: ***  Recent change in output:  ***    D/C: ***    N/V/GERD: ***    Edema: ***    Tooth/Gum disease: ***    Chewing/Swallowing: ***      ESTIMATED NUTRITIONAL NEEDS:  Energy: 1903 kcal  (MSJ AF 1.2)  Protein: 80 gms (1g/kg)  Fluid:  (86mL/kg)  Energy needs based on adjusted weight in kg 69  Protein needs based on actual weight in kg 80  Fluid needs based on adjusted weight in kg 69    ASSESSMENT    Comments:    Nutrition Risks Category:  Low risk    NUTRITION DIAGNOSIS    PES Statement:     INTERVENTIONS    Patient's Stated Goal:     Estimated Readiness for Change:     Educated On: {EDUCATION:30189}  ***    SMART Goals:  ***    EVALUATION/MONITORING    RD will follow up on: ***    Suggestions for upcoming sessions: ***      , RD  01/27/21 1:14 PM  Time in appointment: {TIME (IN 15 MINS):32620}

## 2021-01-27 NOTE — Telephone Encounter (Signed)
This patient attachment is clinically relevant.  Please keep in the patient's chart.    []  Document  [x]  Photo    Brief attachment description: Salivary glands   (Ex. L forearm rash, WC papers)    Thank you,  , RN

## 2021-01-31 ENCOUNTER — Telehealth: Payer: Self-pay | Admitting: Pediatrics

## 2021-01-31 NOTE — Telephone Encounter (Addendum)
Incoming paperwork for completion to the [facility]  Date Received: 01/31/2021    Type of paperwork (sender:form type): [facility] 01/17/21 Telephone Orders    Completed paperwork to be sent to:  Fax to [phone]    Special Requests:     Placed in box for: [name]

## 2021-01-31 NOTE — Telephone Encounter (Signed)
Incoming paperwork for completion to the [facility]  Date Received: 01/31/2021    Type of paperwork (sender:form type): [facility] bg order      Completed paperwork to be sent to:  Fax to [phone]    Special Requests:     Placed in box for: [name]

## 2021-02-01 ENCOUNTER — Ambulatory Visit: Payer: Medicare Other | Attending: Pediatrics | Admitting: Pediatrics

## 2021-02-01 ENCOUNTER — Encounter: Payer: Self-pay | Admitting: Gastroenterology

## 2021-02-01 DIAGNOSIS — Z9911 Dependence on respirator [ventilator] status: Secondary | ICD-10-CM

## 2021-02-01 DIAGNOSIS — S069X9A Unspecified intracranial injury with loss of consciousness of unspecified duration, initial encounter: Secondary | ICD-10-CM | POA: Insufficient documentation

## 2021-02-01 DIAGNOSIS — E1165 Type 2 diabetes mellitus with hyperglycemia: Secondary | ICD-10-CM | POA: Insufficient documentation

## 2021-02-01 DIAGNOSIS — Z794 Long term (current) use of insulin: Secondary | ICD-10-CM | POA: Insufficient documentation

## 2021-02-01 DIAGNOSIS — S069XAA Unspecified intracranial injury with loss of consciousness status unknown, initial encounter: Secondary | ICD-10-CM

## 2021-02-01 NOTE — Patient Instructions (Addendum)
Call for eye doctor appointment:  [facility] [address] [place] [address] [phone]     Please get labs drawn for A1c, lipids, CMP, CBC

## 2021-02-01 NOTE — Progress Notes (Signed)
Angel Wells    TELEMEDICINE VISIT  CHIEF COMPLAINT     Chief Complaint   Patient presents with    Diabetes       Subjective     TELEMEDICINE CONSENT     Visit being conducted by Video in lieu of a face to face visit to minimize health care worker and patient exposure to COVID-19, and conserve PPE.    Location of Patient: home  Location of Telemedicine Provider: hospital / clinical location  Other Participants in telemedicine encounter and roles: Spiro, [place] PDN    Consent was obtained from the patient to complete this telemedicine visit; including the potential for financial liability.  How did the patient provide consent? Verbal Consent Only    SUBJECTIVE     DM - BG are now getting below 200 most of the time, occasionally needs sliding scale but not as much as before  AM sugars 150-160, reviewed that toujeo does not have to be given with food  They have made some dietary changes. Anda Kraft is there 4 days a week    Vent dependent at night - usually has yellow secretions in the morning, run him at 1-2L at night, he is due for a new pulse oximeter, stationary oximeter reads 3-4 points below the fingertip one.  Stationary continuous pulse oximeter and supplies  Fax [phone] G&L (good and lazy)    Cough assist machine broke - was using BID but now they are really struggling to get secretions up, machine was sent out for repair , [employer] in [place]: [employer] cough assist model T70.  Fax number is: [phone]    Salivary glands look better - they went down in size and they will dental followup     MEDICATIONS     Current Outpatient Medications   Medication Sig    valproate (VALPROIC ACID) 50 mg/mL syrup take 5 mls (250 milligram total) by mouth 3 times daily    insulin aspart (NOVOLOG FLEXPEN) 100 UNIT/ML injection pen INJECT SQ BEFORE MEALS: NUTRITIONAL BL: 5 UNITS + SS AS FOLLOWS: <70: HOLD BL, 70-200: BL, > 200: BL+2, >250: BL+4, >300: BL+6, >350: BL +8. ALERT  MD FOR BG <70 AND >350.    ibuprofen (ADVIL,MOTRIN) 600 mg tablet Take 1 tablet (600 mg total) by mouth 4 times daily as needed for Pain for up to 30 doses    generic DME Repair, parts, and labor for coughing assist machine. MCAID CIN: [id number]    blood glucose (ACCU-CHEK AVIVA PLUS) test strip USE 1 STRIP 3 (THREE) TIMES DAILY AND AS NEEDED.DX: E11.65 AND Z79.4    cetirizine (ZYRTEC) 10 mg tablet Take 1 tablet (10 mg total) by mouth nightly    senna (HM SENNA) 8.6 mg tablet Administer 2 tablets via G tube daily    Misc. Devices (PULSE OXIMETER) MISC Pulse Oximeter with Supplies, LON: 99, [id number], J96.11, Z99.11    Syringe Disposable 60 ML MISC Use as directed    generic DME Adhesive tape 1" x 5 yards. Use as directed. Disp 3 box    generic DME Suction canister Use as directed.MCAID ID [id number]    generic DME Suction tubing Use as directed.MCAID ID [id number]    suction machine Instructions for use: Use as needed for secretions, LON: 99 months, [id number]    ipratropium (ATROVENT) 0.02 % nebulizer solution INHALE 2.5 MLS (500 MCG TOTAL) BY NEBULIZATION 4 TIMES DAILY AS NEEDED FOR WHEEZING J45.4    bisacodyl (DULCOLAX)  10 mg suppository please give suppository as needed if no bowel movement in 3 days, please check with dad prior to giving    famotidine (PEPCID) 20 mg tablet take 1 tablet by mouth 2 (two) times daily. ok to crush it and put it down tube if necessary    lacosamide (VIMPAT) 10 mg/mL SOLN oral solution Take 10 MILLILITER per tube twice daily. ; MAXIMUM DAILY DOSE 20 MILLILITER    budesonide (PULMICORT) 0.25 mg/29m nebulizer solution INHALE 2 MLS (0.25 MG TOTAL) BY NEBULIZATION 2 TIMES DAILY FOR ASTHMA    B-D UF III MINI PEN NEEDLES 31G X 5 MM USE 3 (THREE) TIMES DAILY AS NEEDED.    lactulose (LACTULOSE) 20 gm/36msolution Administer 30 mLs (20 g total) via G tube daily as needed (no stool in 48 hours)  USE AS DIRECTED    docusate sodium (COLACE) 10 mg/mL liquid Take 10 mLs (100 mg total)  by mouth 3 times daily  HOld for loose stool    generic DME Pulse ox probe replacement tape. Use as directed.[id number]    generic DME Pulse ox sensors (disposable)Use as directed.MCAID ID [id number]    docusate sodium (DOCUSATE SODIUM) 10 mg/mL liquid Take 10 mLs (100 mg total) by mouth 2 times daily    Accu-Chek FastClix Lancets MISC 1 EACH BY MISC.(NON-DRUG COMBO ROUTE) ROUTE 3 (THREE) TIMES DAILY. E11.65    TOUJEO MAX SOLOSTAR 300 UNIT/ML injection pen inject 100 units into the skin nightly  discard each pen 56 days after first use.    levETIRAcetam (KEPPRA) 100 mg/mL solution Give 15 mL by g-tube TWICE DAILY    generic DME Wells Rom Percussion Vest Model # [id number] as directed.    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  When ill, use four times a day with vesting.    GLUCAGON EMERGENCY 1 MG injection kit Use as directed AS NEEDED for blood glucose < 70    MUCUS RELIEF 400 MG TABS Give 1 TABLET BY MOUTH EVERY 4 HOURS AS NEEDED for expectorant    TOBI 300 MG/5ML nebulizer solution Inhale 5 mLs (300 mg total) by nebulization every 12 hours  for pseudomonas infection, trach/vented patient NEBULIZE 1 VIAL (300 MG) TWICE DAILY EVERY OTHER MONTH   DIAGNOSIS: E84.0    baclofen (LIORESAL) 10 mg tablet take 1 tablet (10 MILLIGRAM total) by mouth 2 times daily    metFORMIN (GLUCOPHAGE) 500 MG/5ML solution Administer 5 mLs (500 mg total) via G tube 2 times daily (with meals)    chlorhexidine (PERIDEX) 0.12 % solution USE AS DIRECTED    calcium carbonate 650 mg tablet Take 650 mg by mouth daily  Via G Tube    Cholecalciferol 25 MCG (1000 UT) CHEW Take 1,000 units by mouth daily  2 chews daily    Non-System Medication Ok to change Gtube at home by nursing staff every 3 months and PRN    generic DME Gel Overlay Mattress.  LON: 99, [id number] as directed. G82.50,  S06.9x9    MIC-KEY gastrostomy kit 14FR Please dispense every 3 months and prn, MicKey 14Fr 2.5cm    EPINEPHrine (EPIPEN) 0.3 mg/0.3 mL auto-injector USE AS DIRECTED    clonazePAM (KLONOPIN) 1 mg tablet TAKE 1 TABLET BY MOUTH TWICE DAILY AS NEEDED (crush for g-tube)    carbamide peroxide (DEBROX) 6.5 %  otic solution Use 5 drops twice daily on the first 3 days of each month.    fluticasone (FLONASE) 50 MCG/ACT nasal spray Spray 1 spray into nostril daily    lactobacillus rhamnosus, GG, (CULTURELLE) capsule Take 1 capsule (1 each total) by mouth daily    nystatin (NYSTATIN) powder Apply topically 4 times daily  to the following areas: neck, groin, axilla    sodium chloride 0.9 % nebulizer solution Inhale 3 mLs by nebulization as needed for Wheezing  MCAID ID [id number]    triamcinolone (KENALOG) 0.025 % cream Apply topically 2 times daily  APPLY TO AFFECTED AREA    zinc oxide (DESITIN) 40 % paste Apply topically as needed for Diaper Rash    generic DME TRACH TIES WITH O2 PORT. Use as directed.MCAID ID [id number]    MIC-KEY gastrostomy feeding tube extension set 12" Use as directed with gtube    generic DME Adult size trach ties Use as directed.    generic DME Butterfly Harness for use in wheelchair, dispense 1. Use as directed. Dx G80.0, S06.9x9, LON: lifetime    MIC-KEY gastrostomy feeding tube extension set 24" Use as directed, please dispense 4 per month    gauze pads 2"X2" Use as directed around trach. MCAID ID [id number]    Nebulizers (PARI LC PLUS VIOS PRO NEB) MISC Pari Vios Pro Neb or comparable with Nebulizer kits and nebulizer trach mask. Use twice daily With albuterol.MCAID # [id number], MCARE [id number]    generic DME 60m Adapter for trach to be used as directed. Replace weekly. Dx.Z99.11    generic DME 15 mm adapters for nebulized therapies via trach  Medicare ID [id number] Medicaid ID [id number]   nebulizer device with mask and tubing Use as directed    generic DME Please dispense one  568msyringe. Use as directed.    generic DME MCAID ID [id number] CUFFED TRACH 7.0 Please change trach every month and as needed.    generic DME Please dispense 3022measuring cup. Use as directed.    Gauze Pads & Dressings (SPLIT GAUZE DRAINAGE SPONGE) 4"X4" Use once and PRN times a day as instructed.    generic DME 10 isothermal adult "antiflect" connector. MCAID [id number] as directed.    Nebulizer MISC Disposable nebulizer kits to be used inline with trach  MCAID ID [id number] generic DME Suction catheter with chimney valve.Use as directed.MCAID ID [id number] generic DME Non-sterile gloves size medium Use as directed. MCAID ID [id number]/month    gauze sponge (CURITY) 4"X4" pads Use PRN as instructed.MCAID ID [id number] generic DME PASSEY MUIR VALVE (PURPLE) Use as tolerated.MCAID ID [id number] Adhesive Tape 1"x5yd TAPE By 1 Units no specified route as needed MCAID ID [id number] generic DME 10 ml syringes Use as directed.MCAID ID [id number] generic DME Trach care kits Use as directed.MCAID ID [id number] surgical lubricant (SURGILUBE) gel Apply topically as needed MCAID ID [id number] Distilled Water LIQD Distilled water, use as directedMCAID ID [id number] generic DME Mepilex 4X 4. Use as directed.MCAID ID [id number] generic DME HME for trach humidification Use as directed.MCAID ID [id number] generic DME Cough Assist circuits Use as directed.MCAID ID [id number] generic DME Suction filter Use as directed.MCAID ID [id number] generic DME Nasal adapters Use as directed.MCAID ID [id number] diphenhydrAMINE (BENADRYL) 25 MG  oral solid 25 mg by Per G Tube route nightly as needed for Sleep (Patient not taking: Reported on 02/19/2020)    Graham Take by mouth Give 2 gummies daily    Multiple Vitamins-Minerals (HM MULTIVITAMIN ADULT GUMMY PO) Take by mouth Give 2 gummies daily    Ascorbic Acid (VITAMIN C ADULT GUMMIES PO) Take 250 mg by mouth daily  Give 2  gummies once daily     cetirizine (ZYRTEC) 1 MG/ML syrup 10 mg by Per G Tube route at bedtime    oxygen 2 l/m bleed through ventilator at night    Blood Glucose Monitoring Suppl (FIFTY50 GLUCOSE METER 2.0) w/Device KIT by Other route    miconazole (MICATIN) 2 % powder 2 times daily as needed     insulin pen needle (FIFTY50 PEN NEEDLES) 31G X 8 MM 1 each     Medications reviewed at today's visit       Objective     OBJECTIVE   PHYSICAL EXAM  This visit was performed during a pandemic event, and the physical exam was limited to my Video observation of this patient's organ systems and/or body areas.   GEN:  Seen in chair, no acute distress  Normal respiratory effort    Physical Exam         ASSESSMENT   Today we discussed the assessment and management of the following diagnosis:    ICD-10-CM ICD-9-CM   1. TBI (traumatic brain injury)  S06.9X9A 854.00   2. Ventilator dependence  Z99.11 V46.11   3. Type 2 diabetes mellitus with hyperglycemia, with long-term current use of insulin  E11.65 250.00    Z79.4 790.29     V58.67       PLAN      1. TBI (traumatic brain injury)  underlying condition, all treatment decisions made with this in consideration. Cared for by private duty nurses at home    2. Ventilator dependence  Due for updated stationary pulse oximeter as well as repairs for cough assist machine.  We will send over prescriptions for these items.  Overall doing well, has normal amount of yellowish sputum in the mornings but it clears by end of the day    3. Type 2 diabetes mellitus with hyperglycemia, with long-term current use of insulin  Doing better on 110units of toujeo and sliding scale, due for HbA1c, CMP, CBC, urine microalbumin - Anda Kraft will draw these at home     4. Seizures   Stable on current regimen    Orders Placed This Encounter   No orders placed during this encounter.       Patient Instructions     Call for eye doctor appointment:  [facility] [address]   [place]  [address] [phone]     Please get labs drawn for A1c, lipids, CMP, CBC           I personally spent 41 minutes, on the calendar day of the encounter, including PreVisit, Visit, and Post visit work, in the care of this patient.      Follow up in about 3 months (around 05/04/2021) for with Sherrie Sport.      Doretha Imus, MD

## 2021-02-02 ENCOUNTER — Telehealth: Payer: Self-pay | Admitting: Pediatrics

## 2021-02-02 MED ORDER — GENERIC DME *A*
0 refills | Status: DC
Start: 2021-02-02 — End: 2022-06-23

## 2021-02-02 MED ORDER — TOUJEO MAX SOLOSTAR 300 UNIT/ML SC SOPN
110.0000 [IU] | PEN_INJECTOR | Freq: Every evening | SUBCUTANEOUS | 2 refills | Status: DC
Start: 2021-02-02 — End: 2021-02-05

## 2021-02-02 NOTE — Telephone Encounter (Signed)
[facility] TELEPHONE TRIAGE     Reason for call: Gavin Pound requested to speak to the patients provider to receive clarification regarding the patients care plan. Gavin Pound requested to review the patients suctioning schedule. Gavin Pound states the patient may need to increase suctioning.     Name of caller: Gavin Pound for Angel Wells  Relationship to patient: RN  Organization: [facility]  Phone:  [phone]

## 2021-02-03 ENCOUNTER — Telehealth: Payer: Self-pay | Admitting: Pediatrics

## 2021-02-03 ENCOUNTER — Encounter: Payer: Self-pay | Admitting: Gastroenterology

## 2021-02-03 NOTE — Telephone Encounter (Signed)
Incoming call from [place], [place] asking for lavender and gold top tubes and vials as her supply has expired.    [name] said she can come to [facility] and pick up tomorrow, 7/29.    Routed to nursing.

## 2021-02-03 NOTE — Progress Notes (Signed)
Message sent to [facility] team PSS with fax number of [facility]

## 2021-02-03 NOTE — Telephone Encounter (Signed)
Writer spoke to [place]. [name] requests Rx order is faxed to [facility], [phone], once signed by provider.

## 2021-02-03 NOTE — Telephone Encounter (Signed)
ATC Deborah.  Unable to LVM.  Will attempt to contact tomorrow.

## 2021-02-04 ENCOUNTER — Other Ambulatory Visit: Payer: Self-pay | Admitting: Pediatrics

## 2021-02-04 ENCOUNTER — Telehealth: Payer: Self-pay | Admitting: Pediatrics

## 2021-02-04 NOTE — Telephone Encounter (Signed)
Paperwork completed 02/04/2021  Sent as outlined in prior documentation

## 2021-02-04 NOTE — Telephone Encounter (Signed)
Incoming paperwork for [facility] to [facility]  Date Received: 02/04/2021    Type of paperwork (sender:form type):[facility] orders based from telemed visit.      Completed paperwork to be sent to:  Fax to [phone]    Special Requests:     Placed in box for: [name]

## 2021-02-04 NOTE — Telephone Encounter (Signed)
Placed arrival labels and supplies in bag up front for pick-up.  Will route to PSS to contact [name] to relay message.

## 2021-02-04 NOTE — Telephone Encounter (Signed)
Called and left Angel Wells a message the supllies is ready for pick up here

## 2021-02-08 ENCOUNTER — Other Ambulatory Visit: Payer: Self-pay | Admitting: Oncology

## 2021-02-08 ENCOUNTER — Encounter: Payer: Self-pay | Admitting: Gastroenterology

## 2021-02-08 ENCOUNTER — Other Ambulatory Visit
Admission: RE | Admit: 2021-02-08 | Discharge: 2021-02-08 | Disposition: A | Discharge status: Home / Self Care | Payer: Medicare Other | Source: Ambulatory Visit | Attending: Pediatrics | Admitting: Pediatrics

## 2021-02-08 ENCOUNTER — Telehealth: Payer: Self-pay | Admitting: Pediatrics

## 2021-02-08 DIAGNOSIS — Z0189 Encounter for other specified special examinations: Secondary | ICD-10-CM | POA: Insufficient documentation

## 2021-02-08 DIAGNOSIS — Z794 Long term (current) use of insulin: Secondary | ICD-10-CM

## 2021-02-08 LAB — CBC AND DIFFERENTIAL
Baso # K/uL: 0 10*3/uL (ref 0.0–0.1)
Basophil %: 0.4 %
Eos # K/uL: 0.1 10*3/uL (ref 0.0–0.5)
Eosinophil %: 1.2 %
Hematocrit: 39 % — ABNORMAL LOW (ref 40–51)
Hemoglobin: 13.4 g/dL — ABNORMAL LOW (ref 13.7–17.5)
IMM Granulocytes #: 0 10*3/uL (ref 0.0–0.0)
IMM Granulocytes: 0.5 %
Lymph # K/uL: 1.9 10*3/uL (ref 1.3–3.6)
Lymphocyte %: 25.2 %
MCH: 31 pg (ref 26–32)
MCHC: 34 g/dL (ref 32–37)
MCV: 91 fL (ref 79–92)
Mono # K/uL: 0.4 10*3/uL (ref 0.3–0.8)
Monocyte %: 5.5 %
Neut # K/uL: 5 10*3/uL (ref 1.8–5.4)
Nucl RBC # K/uL: 0 10*3/uL (ref 0.0–0.0)
Nucl RBC %: 0 /100 WBC (ref 0.0–0.2)
Platelets: 267 10*3/uL (ref 150–330)
RBC: 4.3 MIL/uL — ABNORMAL LOW (ref 4.6–6.1)
RDW: 13.3 % (ref 11.6–14.4)
Seg Neut %: 67.2 %
WBC: 7.5 10*3/uL (ref 4.2–9.1)

## 2021-02-08 LAB — LIPID PANEL
Chol/HDL Ratio: 5
Cholesterol: 154 mg/dL
HDL: 31 mg/dL — ABNORMAL LOW (ref 40–60)
LDL Calculated: 83 mg/dL
Non HDL Cholesterol: 123 mg/dL
Triglycerides: 199 mg/dL — AB

## 2021-02-08 LAB — COMPREHENSIVE METABOLIC PANEL
ALT: 64 U/L — ABNORMAL HIGH (ref 0–50)
AST: 26 U/L (ref 0–50)
Albumin: 4.1 g/dL (ref 3.5–5.2)
Alk Phos: 68 U/L (ref 40–130)
Anion Gap: 15 (ref 7–16)
Bilirubin,Total: 0.3 mg/dL (ref 0.0–1.2)
CO2: 25 mmol/L (ref 20–28)
Calcium: 9.5 mg/dL (ref 9.0–10.3)
Chloride: 93 mmol/L — ABNORMAL LOW (ref 96–108)
Creatinine: 0.25 mg/dL — ABNORMAL LOW (ref 0.67–1.17)
Glucose: 149 mg/dL — ABNORMAL HIGH (ref 60–99)
Lab: 7 mg/dL (ref 6–20)
Potassium: 4.3 mmol/L (ref 3.3–5.1)
Sodium: 133 mmol/L (ref 133–145)
Total Protein: 7.6 g/dL (ref 6.3–7.7)
eGFR BY CREAT: 175 *

## 2021-02-08 LAB — MICROALBUMIN, URINE, RANDOM
Creatinine,UR: 59 mg/dL (ref 20–300)
Microalbumin,UR: <1.2 mg/dL

## 2021-02-08 LAB — HEMOGLOBIN A1C: Hemoglobin A1C: 7 % — ABNORMAL HIGH

## 2021-02-08 NOTE — Telephone Encounter (Signed)
Writer clarified that lactulose order was changed on 10/28/2020. Angel Wells verbalized understanding.   Says that she will update orders.

## 2021-02-08 NOTE — Telephone Encounter (Signed)
Incoming call from [place], Charity fundraiser.  [name] reported that lactulose was recently picked up from pharmacy and the label is different than nursing order. [name] requested return call with clarification on which dose she could use.    Order: 10g/76mL three times a day.  Label: 67mL three times a day.    Cb# [id number]    Routed to nursing.

## 2021-02-08 NOTE — Telephone Encounter (Signed)
Called and update pharmacy to notify that the current order is correct. Home nurse also aware. No further needs.

## 2021-02-08 NOTE — Telephone Encounter (Signed)
[facility] TELEPHONE TRIAGE     Reason for call: Aggie Cosier requested to clarify the directions of the patients lactulose medication. Aggie Cosier states the [facility] nurse relayed the patient is only administered 15 ML daily instead of 30 ML. Aggie Cosier requested to receive a return call to any pharmacist at the pharmacy.     Name of caller: Aggie Cosier for Angel Wells  Relationship to patient: Pharmacist  Organization: [facility]  Phone:  [phone]

## 2021-02-11 NOTE — Telephone Encounter (Signed)
Paperwork completed 02/11/2021  Sent as outlined in prior documentation

## 2021-02-14 ENCOUNTER — Other Ambulatory Visit: Payer: Self-pay | Admitting: Pediatrics

## 2021-02-14 ENCOUNTER — Telehealth: Payer: Self-pay | Admitting: Pediatrics

## 2021-02-14 NOTE — Telephone Encounter (Signed)
Incoming paperwork for completion to the [facility]  Date Received: 02/14/2021    Type of paperwork (sender:form type): [facility] request for lactulose instructions + provider signature      Completed paperwork to be sent to:  Fax to [phone]    Special Requests:     Placed in box for: [name]

## 2021-02-16 ENCOUNTER — Other Ambulatory Visit: Payer: Self-pay | Admitting: Pediatrics

## 2021-02-16 DIAGNOSIS — J4540 Moderate persistent asthma, uncomplicated: Secondary | ICD-10-CM

## 2021-02-17 NOTE — Telephone Encounter (Signed)
Writer placed call to [facility] @ [phone] and spoke w Clotilde Dieter.    Confirmed that the rx were received.  Rosa reported that pt's pulse oximeter is not covered for repairs under medicare.     Rosa also reported that pt rec'd his cough assist machine in 2019 by [facility]. Rosa said that pt's cough assist machine needs to be sent to [facility] for repairs.    This information contradicts what RRT entered in last notes below. Routing to [employer] for review/advice.

## 2021-02-18 ENCOUNTER — Telehealth: Payer: Self-pay | Admitting: Pediatrics

## 2021-02-18 NOTE — Telephone Encounter (Signed)
Writer placed obc to Byron, pt's dad.   Dad reported that pt's cough machine was sent out for repairs, but unfortunately the repairs will cost about $1200.00 and reportedly insurance will not cover it.   Dad recommended that writer call [name], RN to get more info. Writer agreed.  --  Clinical research associate placed call to [place], RN  [phone].

## 2021-02-18 NOTE — Telephone Encounter (Signed)
Konrad Dolores     Care Manager     Since 01/17/2021     [phone]     Writer placed call to [place] at number above.  Writer provided full background of pt's cough assist machine repairs.   Junious Dresser said she would reach out to the head of Utilization Management to see if there is anything that [employer] can do to assist.   Provided my cb#.

## 2021-02-18 NOTE — Telephone Encounter (Signed)
Incoming paperwork for completion to the [facility]  Date Received: 02/18/2021    Type of paperwork (sender:form type): [facility] request for rx and medical documentation for continuous oximeter    Completed paperwork to be sent to:  Fax to [phone]    Special Requests:     Placed in box for: n/a Scientific laboratory technician printed out required documentation and will fax to number listed above.

## 2021-02-18 NOTE — Telephone Encounter (Signed)
Writer placed call to Juliette, RN  [phone].  Writer asked [name] what the status is on pt's cough machine repairs and oximeter.    [name] reported that the cough machine was mailed to [facility] [facility] in [place] and is still there pending repairs. [name] reported that repairs will total $1,200 and that [employer] will not accept Medicare.   [name] reported that she has been working on this for two years and she just put a request in with management @ [facility] to cover the cost until the Medicare reimbursement check is recd by pt/dad to cover repairs.    Writer asked [name] for the service ticket number and phone number for [employer] Contact:  Service ticket number: [id number]  Danton Sewer - [phone]  ------  Writer placed call to Sarah at [employer]; no answer. Left vm w ticket number and requested return call. Provided cb#.

## 2021-02-22 ENCOUNTER — Telehealth: Payer: Self-pay | Admitting: Pediatrics

## 2021-02-22 NOTE — Telephone Encounter (Signed)
Nurse [name] called  wants to be kept in the loop too phone number [phone]

## 2021-02-22 NOTE — Telephone Encounter (Signed)
Incoming paperwork for completion to the [facility]  Date Received: 02/22/2021    Type of paperwork (sender:form type):[employer] request for documentation regarding pt's continuous oximeter. [employer] is requesting specific details    -detailed treatment plan listing required parameters and interventions for abnormal readings, including corresponding oxygen titrations if applicable.    -availability of caregivers trained to appropriately manage the listed treatment interventions    -current oxygen orders      Completed paperwork to be sent to:  Fax to [phone]    Special Requests: a letter detailing the above info that PSS can fax along with office notes.    Placed in box for: Beaulah Corin

## 2021-02-23 ENCOUNTER — Other Ambulatory Visit: Payer: Self-pay | Admitting: Pediatrics

## 2021-02-23 NOTE — Telephone Encounter (Signed)
Paperwork completed 02/23/2021  Sent as outlined in prior documentation

## 2021-02-28 ENCOUNTER — Other Ambulatory Visit: Payer: Self-pay | Admitting: Pediatrics

## 2021-02-28 NOTE — Telephone Encounter (Signed)
[facility] TELEPHONE TRIAGE     Reason for call: Koleen Nimrod requested to speak to a nurse regarding the patients need for oximeter. Koleen Nimrod inquired if patient needs a continuous oximeter or one that is placed on the patients finger.     Name of caller: Koleen Nimrod for [name]  Organization: [employer]   Phone: [phone]

## 2021-03-01 NOTE — Telephone Encounter (Signed)
Attempted to call Koleen Nimrod back  No answer- LVM to call clinic back  Patient needs continuous oximeter per last visit notes  Leonard Schwartz, RN

## 2021-03-02 ENCOUNTER — Other Ambulatory Visit: Payer: Self-pay | Admitting: Pediatrics

## 2021-03-02 DIAGNOSIS — J45909 Unspecified asthma, uncomplicated: Secondary | ICD-10-CM

## 2021-03-02 MED ORDER — ALBUTEROL SULFATE (2.5 MG/3ML) 0.083% IN NEBU *I*
2.5000 mg | INHALATION_SOLUTION | Freq: Three times a day (TID) | RESPIRATORY_TRACT | 3 refills | Status: DC
Start: 2021-03-02 — End: 2021-12-01

## 2021-03-02 NOTE — Telephone Encounter (Addendum)
Koleen Nimrod called again and would like a call back. Phone number is [phone]      This is the correct phone number to reach her.

## 2021-03-03 NOTE — Telephone Encounter (Signed)
ATC [employer]   LMTCB  If they call back please see if they need to speak to nurse or if they are awaiting form that was taken in a the start of this message.

## 2021-03-04 ENCOUNTER — Telehealth: Payer: Self-pay | Admitting: Pediatrics

## 2021-03-04 NOTE — Telephone Encounter (Signed)
Incoming paperwork for completion to the [facility]  Date Received: 03/04/2021    Type of paperwork (sender:form type): [facility] Certification and Plan of Care    Completed paperwork to be sent to:  Fax to [phone]    Special Requests:     Placed in box for: [name]

## 2021-03-09 NOTE — Telephone Encounter (Signed)
This encounter was created in error - please disregard.

## 2021-03-17 ENCOUNTER — Encounter: Payer: Self-pay | Admitting: Gastroenterology

## 2021-03-21 ENCOUNTER — Telehealth: Payer: Self-pay | Admitting: Oncology

## 2021-03-21 NOTE — Telephone Encounter (Signed)
Paperwork completed 03/21/2021  Sent as outlined in prior documentation

## 2021-03-21 NOTE — Telephone Encounter (Signed)
Incoming paperwork for completion to the [facility]  Date Received: 03/21/2021    Type of paperwork (sender:form type): [facility] Home [facility] Certification and Plan of Care    Completed paperwork to be sent to:  Fax to [phone]    Special Requests:     Placed in box for: [name]

## 2021-03-23 ENCOUNTER — Encounter: Payer: Self-pay | Admitting: Primary Care

## 2021-03-23 ENCOUNTER — Other Ambulatory Visit: Payer: Self-pay | Admitting: Pediatrics

## 2021-03-23 DIAGNOSIS — J9611 Chronic respiratory failure with hypoxia: Secondary | ICD-10-CM

## 2021-03-23 DIAGNOSIS — J4540 Moderate persistent asthma, uncomplicated: Secondary | ICD-10-CM

## 2021-03-23 DIAGNOSIS — Z9911 Dependence on respirator [ventilator] status: Secondary | ICD-10-CM

## 2021-03-23 MED ORDER — OXYGEN *A*
0 refills | Status: AC
Start: 2021-03-23 — End: ?

## 2021-03-23 NOTE — Progress Notes (Signed)
Angel Wells continues to have chronic respiratory needs including oxygen therapy. He needs to be continuously monitored to assure his oxygen saturation remains greater than 92%. He is using 1-2 liters inline with ventilator at night and during the day as needed during times of illness. Supplemental oxygen is required if his saturation declines below 92%.   His family and home nurses are well educated on monitoring and when to add supplemental oxygen if needed. They call the office appropriately if Angel Wells has increase oxygen needs that are not resolving with addition of supplemental O2.

## 2021-03-30 ENCOUNTER — Ambulatory Visit: Payer: Medicare Other | Attending: Oncology | Admitting: Oncology

## 2021-03-30 ENCOUNTER — Encounter: Payer: Self-pay | Admitting: Oncology

## 2021-03-30 VITALS — BP 113/55 | HR 79 | Temp 97.1°F

## 2021-03-30 DIAGNOSIS — S069X1D Unspecified intracranial injury with loss of consciousness of 30 minutes or less, subsequent encounter: Secondary | ICD-10-CM

## 2021-03-30 DIAGNOSIS — Z23 Encounter for immunization: Secondary | ICD-10-CM | POA: Insufficient documentation

## 2021-03-30 DIAGNOSIS — Z9911 Dependence on respirator [ventilator] status: Secondary | ICD-10-CM | POA: Insufficient documentation

## 2021-03-30 DIAGNOSIS — G40909 Epilepsy, unspecified, not intractable, without status epilepticus: Secondary | ICD-10-CM | POA: Insufficient documentation

## 2021-03-30 DIAGNOSIS — Z794 Long term (current) use of insulin: Secondary | ICD-10-CM

## 2021-03-30 DIAGNOSIS — E1165 Type 2 diabetes mellitus with hyperglycemia: Secondary | ICD-10-CM | POA: Insufficient documentation

## 2021-03-30 MED ORDER — BISACODYL 10 MG RE SUPP *I*
RECTAL | 5 refills | Status: DC
Start: 2021-03-30 — End: 2021-08-03

## 2021-03-30 MED ORDER — CLONAZEPAM 1 MG PO TABS *I*
ORAL_TABLET | ORAL | 0 refills | Status: DC
Start: 2021-03-30 — End: 2022-03-17

## 2021-03-30 MED ORDER — METFORMIN HCL 500 MG/5ML PO SOLN *I*
1000.0000 mg | Freq: Two times a day (BID) | ORAL | 5 refills | Status: DC
Start: 2021-03-30 — End: 2021-05-27

## 2021-03-30 NOTE — Progress Notes (Addendum)
[place]    Office Visit Note     REASON FOR VISIT      Chief Complaint   Patient presents with    Follow-up         PRIMARY DIAGNOSIS     TBI    Subjective     SUBJECTIVE      Jocelyn was accompanied by Anda Kraft, RN who provided most of subjective history       Ventilator Dependence  Needs a letter for insurance company to justify need for continuous oxygen monitoring       Seizures  Needs a new PRN prescription for Clonazepam (#10 pills)   Seizures are brief lasting seconds, 3 in August, none in June, July or September  Sees neurology twice a year.   Since being on Vimpat his seizures have decreased        History of TBI  Would like to talk about options for meeting people his own age in the community  Do not want him to go to day program so they don't lose nursing hours   Would like to talk to social worker   Would like to talk to case manager regarding health care needs       Review of Systems   Constitutional: Negative for chills, fever and weight loss.   HENT: Negative for congestion and ear pain.    Eyes: Negative for discharge and redness.   Respiratory: Negative for cough and shortness of breath.    Cardiovascular: Negative for chest pain.   Gastrointestinal: Positive for constipation. Negative for abdominal pain, diarrhea, nausea and vomiting.   Genitourinary: Negative for dysuria.   Skin: Negative for rash.   Neurological: Negative for headaches.     Review of Systems as per HPI above    Past Medical History, Social History, Family History, and Medications/allergies reviewed during this visit    Current Outpatient Medications   Medication    metFORMIN (GLUCOPHAGE) 500 MG/5ML solution    bisacodyl (DULCOLAX) 10 mg suppository    budesonide (PULMICORT) 0.25 mg/50m nebulizer suspension    albuterol (PROVENTIL) (2.5 mg/322m 0.083% nebulizer solution    ipratropium (ATROVENT) 0.02 % nebulizer solution    SENNA 8.6 MG tablet    generic DME    generic DME    valproate (VALPROIC ACID)  50 mg/mL syrup    insulin aspart (NOVOLOG FLEXPEN) 100 UNIT/ML injection pen    generic DME    Misc. Devices (PULSE OXIMETER) MISC    Syringe Disposable 60 ML MISC    generic DME    generic DME    generic DME    suction machine    famotidine (PEPCID) 20 mg tablet    lacosamide (VIMPAT) 10 mg/mL SOLN oral solution    docusate sodium (COLACE) 10 mg/mL liquid    generic DME    generic DME    Accu-Chek FastClix Lancets MISC    levETIRAcetam (KEPPRA) 100 mg/mL solution    generic DME    TOBI 300 MG/5ML nebulizer solution    chlorhexidine (PERIDEX) 0.12 % solution    calcium carbonate 650 mg tablet    Cholecalciferol 25 MCG (1000 UT) CHEW    Non-System Medication    generic DME    MIC-KEY gastrostomy kit 14FR    EPINEPHrine (EPIPEN) 0.3 mg/0.3 mL auto-injector    carbamide peroxide (DEBROX) 6.5 % otic solution    lactobacillus rhamnosus, GG, (CULTURELLE) capsule    nystatin (NYSTATIN) powder    generic  DME    MIC-KEY gastrostomy feeding tube extension set 12"    generic DME    generic DME    MIC-KEY gastrostomy feeding tube extension set 24"    gauze pads 2"X2"    Nebulizers (PARI LC PLUS VIOS PRO NEB) MISC    generic DME    generic DME    nebulizer device with mask and tubing    generic DME    Gauze Pads & Dressings (SPLIT GAUZE DRAINAGE SPONGE) 4"X4"    generic DME    Nebulizer MISC    generic DME    generic DME    gauze sponge (CURITY) 4"X4" pads    generic DME    Adhesive Tape 1"x5yd TAPE    generic DME    generic DME    Distilled Water LIQD    generic DME    generic DME    generic DME    generic DME    generic DME    FIBER SELECT GUMMIES PO    Multiple Vitamins-Minerals (HM MULTIVITAMIN ADULT GUMMY PO)    Ascorbic Acid (VITAMIN C ADULT GUMMIES PO)    cetirizine (ZYRTEC) 1 MG/ML syrup    oxygen    Blood Glucose Monitoring Suppl (FIFTY50 GLUCOSE METER 2.0) w/Device KIT    insulin pen needle (FIFTY50 PEN NEEDLES) 31G X 8 MM    baclofen (LIORESAL) 10 mg tablet     Insulin Glargine, 2 Unit Dial, (TOUJEO MAX SOLOSTAR) 300 UNIT/ML injection pen    LACTULOSE 10 GM/15ML solution    fluticasone (FLONASE) 50 MCG/ACT nasal spray    clonazePAM (KLONOPIN) 1 mg tablet    oxygen    B-D UF III MINI PEN NEEDLES 31G X 5 MM    ibuprofen (ADVIL,MOTRIN) 600 mg tablet    blood glucose (ACCU-CHEK AVIVA PLUS) test strip    cetirizine (ZYRTEC) 10 mg tablet    docusate sodium (DOCUSATE SODIUM) 10 mg/mL liquid    GLUCAGON EMERGENCY 1 MG injection kit    MUCUS RELIEF 400 MG TABS    sodium chloride 0.9 % nebulizer solution    triamcinolone (KENALOG) 0.025 % cream    zinc oxide (DESITIN) 40 % paste    generic DME    generic DME    surgical lubricant (SURGILUBE) gel    diphenhydrAMINE (BENADRYL) 25 MG oral solid    miconazole (MICATIN) 2 % powder     No current facility-administered medications for this visit.          Objective     OBJECTIVE      BP 113/55    Pulse 79    Temp 36.2 C (97.1 F) (Temporal)    SpO2 93%     Physical Exam  Constitutional:       General: He is not in acute distress.     Appearance: He is obese.   Cardiovascular:      Rate and Rhythm: Normal rate and regular rhythm.      Heart sounds: No murmur heard.  Pulmonary:      Breath sounds: Rhonchi present. No wheezing.   Abdominal:      General: Bowel sounds are normal. There is no distension.      Palpations: Abdomen is soft.      Tenderness: There is no abdominal tenderness.   Skin:     General: Skin is warm and dry.   Neurological:      Mental Status: He is alert.  ASSESSMENT / DIAGNOSIS         1. Ventilator dependence  Underlying condition, all treatment decisions made with this in consideration   Jacolby requires continuous oxygen monitoring for safety- frequent continuous monitoring of oxygen levels or monitoring cannot be achieved by using spot check oximeter".  This is life long and long term in duration.        2. Seizure disorder  Underlying condition, all treatment decisions made with  this in consideration   - clonazePAM (KLONOPIN) 1 mg tablet; TAKE 1 TABLET BY MOUTH TWICE DAILY AS NEEDED (crush for g-tube)  Dispense: 10 tablet; Refill: 0    3. Traumatic brain injury, with loss of consciousness of 30 minutes or less, subsequent encounter  Underlying condition, all treatment decisions made with this in consideration   Will engage our social work and care management team for help with identifying social supports      4. Type 2 diabetes mellitus with hyperglycemia, with long-term current use of insulin  Due for screening  - Hemoglobin A1c; Future      [name] needs a new hospital bed mattress due to his current one being worn out.    Orders Placed This Encounter    Influenza 0.65m prefilled syringe/single-dose vial (FluLaval, Fluzone, Afluria, Fluarix)    Hemoglobin A1c    metFORMIN (GLUCOPHAGE) 500 MG/5ML solution    clonazePAM (KLONOPIN) 1 mg tablet    bisacodyl (DULCOLAX) 10 mg suppository     Patient Instructions   Diabetes:   Increase metformin to 10017mtwice a day  Continue toujeo at 110 units         --Patient instructed to call if symptoms are not improving or worsening  --Follow-up arranged  No follow-ups on file.     I personally spent 20 minutes on the calendar day of the encounter, including pre and post visit work reviewing the EMR and management of this patient.     Electronically signed by AmPhylis BougieNP , 04/16/2021 @   URMila DocePhone: [phone]

## 2021-03-30 NOTE — Patient Instructions (Addendum)
Diabetes:   Increase metformin to 1000mg  twice a day  Continue toujeo at 110 units

## 2021-03-30 NOTE — Progress Notes (Signed)
Clinical Pharmacy Services_        Angel Wells is a 30 y.o. male referred for Pharmacotherapy Consult + Comprehensive Medication Management.  Patient's medications and allergies were updated and marked as reviewed, as appropriate. This is a Initial visit that was conducted via In Person.      Current Medication Regimen   Current Outpatient Medications   Medication Sig Dispense Refill    metFORMIN (GLUCOPHAGE) 500 MG/5ML solution Take 10 mLs (1,000 mg total) by mouth 2 times daily (with meals)  for Type 2 Diabetes 473 mL 5    clonazePAM (KLONOPIN) 1 mg tablet TAKE 1 TABLET BY MOUTH TWICE DAILY AS NEEDED (crush for g-tube) 10 tablet 0    bisacodyl (DULCOLAX) 10 mg suppository Use one suppository as needed for no BM after 48 hours 12 each 5    budesonide (PULMICORT) 0.25 mg/36m nebulizer suspension INHALE 2 MLS (0.25 MG TOTAL) BY NEBULIZATION 2 TIMES DAILY FOR ASTHMA 360 mL 1    oxygen 1-2 L/min to keep O2 sat above 92%, may increase up to 4 lpm as needed.  Low saturation alarm 85%, high alarm off, low HR 50, High HR 200 1 each 0    albuterol (PROVENTIL) (2.5 mg/364m 0.083% nebulizer solution Inhale 3 mLs (2.5 mg total) by nebulization 3 times daily  for Spasm of Lung Air Passages with vesting.  When ill, use four times a day with vesting. 1050 mL 3    LACTULOSE 10 GM/15ML solution administer 30 mls (20 g total) via g tube daily as needed (no stool in 48 hours) use as directed 946 mL 5    fluticasone (FLONASE) 50 MCG/ACT nasal spray spray 1 spray into nostril daily 16 mL 5    ipratropium (ATROVENT) 0.02 % nebulizer solution INHALE 2.5 MLS (500 MCG TOTAL) BY NEBULIZATION 3 TIMES DAILY FOR ASTHMA 125 mL 5    SENNA 8.6 MG tablet administer 2 tablets via g tube daily 180 tablet 0    B-D UF III MINI PEN NEEDLES 31G X 5 MM USE 3 (THREE) TIMES DAILY AS NEEDED. 100 each 3    TOUJEO MAX SOLOSTAR 300 UNIT/ML injection pen inject 100 units into the skin nightly  discard each pen 56 days after first use. (Patient  taking differently: Inject 110 units into the skin nightly  for Type 2 Diabetes ) 9 mL 2    generic DME Item to dispense: Stationary Continuous Oximeter  Instructions for use: Use at night time Dx: Z99.11 1 each 0    generic DME Cough assist machine repairs for: [employer] respironics cough assist model T70. 1 each 0    valproate (VALPROIC ACID) 50 mg/mL syrup take 5 mls (250 milligram total) by mouth 3 times daily 1350 mL 0    insulin aspart (NOVOLOG FLEXPEN) 100 UNIT/ML injection pen INJECT SQ BEFORE MEALS: NUTRITIONAL BL: 5 UNITS + SS AS FOLLOWS: <70: HOLD BL, 70-200: BL, > 200: BL+2, >250: BL+4, >300: BL+6, >350: BL +8. ALERT MD FOR BG <70 AND >350. 30 each 8    ibuprofen (ADVIL,MOTRIN) 600 mg tablet Take 1 tablet (600 mg total) by mouth 4 times daily as needed for Pain for up to 30 doses 30 tablet 5    generic DME Repair, parts, and labor for coughing assist machine. MCAID CIN: [id number] each 0    blood glucose (ACCU-CHEK AVIVA PLUS) test strip USE 1 STRIP 3 (THREE) TIMES DAILY AND AS NEEDED.DX: E11.65 AND Z79.4 100 each 5    cetirizine (  ZYRTEC) 10 mg tablet Take 1 tablet (10 mg total) by mouth nightly 90 tablet 5    Misc. Devices (PULSE OXIMETER) MISC Pulse Oximeter with Supplies, LON: 99, [id number], J96.11, Z99.11 1 each 0    Syringe Disposable 60 ML MISC Use as directed 5 each 5    generic DME Adhesive tape 1" x 5 yards. Use as directed. Disp 3 box 3 each 5    generic DME Suction canister Use as directed.MCAID ID [id number] 4 each 5    generic DME Suction tubing Use as directed.MCAID ID [id number] 4 each 5    suction machine Instructions for use: Use as needed for secretions, LON: 99 months, [id number] 1 each 0    famotidine (PEPCID) 20 mg tablet take 1 tablet by mouth 2 (two) times daily. ok to crush it and put it down tube if necessary 180 tablet 0    lacosamide (VIMPAT) 10 mg/mL SOLN oral solution Take 10 MILLILITER per tube twice daily. ; MAXIMUM DAILY DOSE 20 MILLILITER 600 mL 5    docusate  sodium (COLACE) 10 mg/mL liquid Take 10 mLs (100 mg total) by mouth 3 times daily  HOld for loose stool 900 mL 5    generic DME Pulse ox probe replacement tape. Use as directed.[id number], LON 99 15 each 5    generic DME Pulse ox sensors (disposable)Use as directed.MCAID ID [id number], LON 99 4 each 5    docusate sodium (DOCUSATE SODIUM) 10 mg/mL liquid Take 10 mLs (100 mg total) by mouth 2 times daily (Patient not taking: Reported on 03/30/2021) 600 mL 5    Accu-Chek FastClix Lancets MISC 1 EACH BY MISC.(NON-DRUG COMBO ROUTE) ROUTE 3 (THREE) TIMES DAILY. E11.65 102 each 5    levETIRAcetam (KEPPRA) 100 mg/mL solution Give 15 mL by g-tube TWICE DAILY 2700 mL 1    generic DME [employer] Percussion Vest Model # 105  55inch  Use as directed. 1 each 0    GLUCAGON EMERGENCY 1 MG injection kit Use as directed AS NEEDED for blood glucose < 70 1 each 1    MUCUS RELIEF 400 MG TABS Give 1 TABLET BY MOUTH EVERY 4 HOURS AS NEEDED for expectorant 180 tablet 1    TOBI 300 MG/5ML nebulizer solution Inhale 5 mLs (300 mg total) by nebulization every 12 hours  for pseudomonas infection, trach/vented patient NEBULIZE 1 VIAL (300 MG) TWICE DAILY EVERY OTHER MONTH   DIAGNOSIS: E84.0 280 mL 4    baclofen (LIORESAL) 10 mg tablet take 1 tablet (10 MILLIGRAM total) by mouth 2 times daily 180 tablet 1    chlorhexidine (PERIDEX) 0.12 % solution USE AS DIRECTED 473 mL 5    calcium carbonate 650 mg tablet Take 650 mg by mouth daily  Via G Tube      Cholecalciferol 25 MCG (1000 UT) CHEW Take 1,000 units by mouth daily  2 chews daily      Non-System Medication Ok to change Gtube at home by nursing staff every 3 months and PRN 1 each 0    generic DME Gel Overlay Mattress.  LON: 99, [id number]. Use as directed. G82.50, S06.9x9 1 each 0    MIC-KEY gastrostomy kit 14FR Please dispense every 3 months and prn, MicKey 14Fr 2.5cm 1 kit 3    EPINEPHrine (EPIPEN) 0.3 mg/0.3 mL auto-injector USE AS DIRECTED 2 each 0    carbamide peroxide (DEBROX)  6.5 % otic solution Use 5 drops twice daily on the first 3 days of each month. 15 mL  5    lactobacillus rhamnosus, GG, (CULTURELLE) capsule Take 1 capsule (1 each total) by mouth daily 30 capsule 5    nystatin (NYSTATIN) powder Apply topically 4 times daily  to the following areas: neck, groin, axilla 45 g 5    sodium chloride 0.9 % nebulizer solution Inhale 3 mLs by nebulization as needed for Wheezing  MCAID ID [id number] 90 mL 5    triamcinolone (KENALOG) 0.025 % cream Apply topically 2 times daily  APPLY TO AFFECTED AREA (Patient not taking: Reported on 03/30/2021) 15 g 2    zinc oxide (DESITIN) 40 % paste Apply topically as needed for Diaper Rash 57 g 1    generic DME TRACH TIES WITH O2 PORT. Use as directed.MCAID ID [id number] 30 each 11    MIC-KEY gastrostomy feeding tube extension set 12" Use as directed with gtube 1 kit 11    generic DME Adult size trach ties Use as directed. 30 each 5    generic DME [employer] for use in wheelchair, dispense 1. Use as directed. Dx G80.0, S06.9x9, LON: lifetime 1 each 0    MIC-KEY gastrostomy feeding tube extension set 24" Use as directed, please dispense 4 per month 4 kit 5    gauze pads 2"X2" Use as directed around trach. MCAID ID [id number] 100 each 5    Nebulizers (PARI LC PLUS VIOS PRO NEB) MISC Pari Vios Pro Neb or comparable with Nebulizer kits and nebulizer trach mask. Use twice daily With albuterol.MCAID # [id number], MCARE [id number] 1 each 0    generic DME 62m Adapter for trach to be used as directed. Replace weekly. Dx.Z99.11 4 each 5    generic DME 15 mm adapters for nebulized therapies via trach  Medicare ID [id number] Medicaid ID [id number] each 11    nebulizer device with mask and tubing Use as directed 1 kit 0    generic DME Please dispense one 530msyringe. Use as directed. (Patient not taking: Reported on 03/30/2021) 1 each 5    generic DME MCAID ID [id number] CUFFED TRACH 7.0 Please change trach every month and as needed. 1 each 5     generic DME Please dispense 3087measuring cup. Use as directed. 1 each 5    Gauze Pads & Dressings (SPLIT GAUZE DRAINAGE SPONGE) 4"X4" Use once and PRN times a day as instructed. 30 each 5    generic DME 10 isothermal adult "antiflect" connector. MCAID [id number] as directed. 10 each 5    Nebulizer MISC Disposable nebulizer kits to be used inline with trach  MCAID ID [id number] 5    generic DME Suction catheter with chimney valve.Use as directed.MCAID ID [id number] 0    generic DME Non-sterile gloves size medium Use as directed. MCAID ID [id number]/month 3 each 5    gauze sponge (CURITY) 4"X4" pads Use PRN as instructed.MCAID ID [id number] each 5    generic DME PASSEY MUIR VALVE (PURPLE) Use as tolerated.MCAID ID [id number] 3    Adhesive Tape 1"x5yd TAPE By 1 Units no specified route as needed MCAID ID [id number] 5    generic DME 10 ml syringes Use as directed.MCAID ID [id number] 5    generic DME Trach care kits Use as directed.MCAID ID [id number] each 5    surgical lubricant (SURGILUBE) gel Apply topically as needed MCAID ID [id number] g 5    Distilled Water LIQD Distilled water, use as directedMCAID ID [id number]  Bottle 5    generic DME Mepilex 4X 4. Use as directed.MCAID ID [id number] 1 each 5    generic DME HME for trach humidification Use as directed.MCAID ID [id number] 70 each 5    generic DME Cough Assist circuits Use as directed.MCAID ID [id number] 1 each 5    generic DME Suction filter Use as directed.MCAID ID [id number] 2 each 5    generic DME Nasal adapters Use as directed.MCAID ID [id number] 5 each 5    diphenhydrAMINE (BENADRYL) 25 MG oral solid 25 mg by Per G Tube route nightly as needed for Sleep (Patient not taking: Reported on 03/30/2021)      [employer] [place] [employer] Take by mouth Give 2 gummies daily      Multiple Vitamins-Minerals (HM MULTIVITAMIN ADULT GUMMY PO) Take by mouth Give 2 gummies daily      Ascorbic Acid (VITAMIN C ADULT GUMMIES PO)  Take 250 mg by mouth daily  Give 2 gummies once daily       cetirizine (ZYRTEC) 1 MG/ML syrup 10 mg by Per G Tube route at bedtime      oxygen 2 l/m bleed through ventilator at night 1 each 0    Blood Glucose Monitoring Suppl (FIFTY50 GLUCOSE METER 2.0) w/Device KIT by Other route      miconazole (MICATIN) 2 % powder 2 times daily as needed       insulin pen needle (FIFTY50 PEN NEEDLES) 31G X 8 MM 1 each       No current facility-administered medications for this visit.     Assessment  With regards to medication management for this patient, the following aspects were reviewed: Indication, effectiveness, safety and convenience of each medication. The patient's medical conditions and medications were assessed, evaluated, and deemed meeting goals of drug therapy except as listed below:    Recommendations/Plan  1) Type 2 Diabetes  Hemoglobin A1c down trending, GH has been making adjustments to Angel Wells's diet. Changes include only having 1 serving of a carbohydrate with each meal and "less junk like he was having before". Also incorporating fruit/veggie and protein at each meal. BG's are usually under 225m/dL which is improved from [facility] RN reports he usually eats a late breakfast around 10am and has lunch around 12p so lunch time bg's tend to be higher.    BG's in September:     Time: BG mg/dl:   Breakfast 0930  1100  1000  1030  0950 151  154  145  165  169   Lunch 1300  1350  1400  1445  1215 210  174  175  168  208   Dinner 1730  1750  1715  1830  1700 156  226  179  166  218     To get Angel Wells's A1c closer to goal of 6% recommend increasing metformin to 10057mBID for dose optimization. He also gets 110units of Toujeo nightly. Will optimize metformin dosing before adjusting Toujeo further. Repeat A1c in 3 months.    ---------------------------------------------------------------------------------------------------------------  This report was sent to AmSherrie SportNP for review  and final approval. Patient/Caregiver was able to verbalize understanding and repeat back key educational points.    Thank you for allowing me to participate in the care of this patient. Please contact me with any questions.    MaElvera MariaPharmD., [id number] Lilac LaneRoPer[place]ville[place] [phone]  [phone]

## 2021-03-31 ENCOUNTER — Telehealth: Payer: Self-pay | Admitting: Oncology

## 2021-03-31 ENCOUNTER — Encounter: Payer: Self-pay | Admitting: Gastroenterology

## 2021-03-31 ENCOUNTER — Other Ambulatory Visit: Payer: Self-pay

## 2021-03-31 NOTE — Telephone Encounter (Signed)
Incoming [facility]  Date Received: 03/21/2021    Type of paperwork (sender:form type): [facility] Missed Visit Note    Completed paperwork to be sent to:  Fax to [phone]    Special Requests:     Placed in box for: [name]

## 2021-03-31 NOTE — Progress Notes (Signed)
CM met with Angel Wells and his nurse. They were interested in community resources that Angel Wells can participate in that does not bill via Medicaid in order to keep his current waiver services intact. CM provided Angel Wells and his nurse with resources,  [employer] creative wellness program and life skills program. CM  printed out some of the information and provided a link for other programs offered.  CM provide Angel Wells  and his nurse that accompanied him  my card/ contact information and encouraged Angel Wells to reach out if he requires more assistance.

## 2021-04-01 NOTE — Telephone Encounter (Signed)
Paperwork completed 04/01/2021  Sent as outlined in prior documentation

## 2021-04-06 ENCOUNTER — Encounter: Payer: Self-pay | Admitting: Gastroenterology

## 2021-04-06 ENCOUNTER — Telehealth: Payer: Self-pay | Admitting: Oncology

## 2021-04-06 DIAGNOSIS — E1165 Type 2 diabetes mellitus with hyperglycemia: Secondary | ICD-10-CM

## 2021-04-06 DIAGNOSIS — Z794 Long term (current) use of insulin: Secondary | ICD-10-CM

## 2021-04-06 NOTE — Telephone Encounter (Signed)
Incoming call from Jacques Earthly, PDN for pt.    [name] reported that since pt's Metformin dose was increased on 9/21, pt has been having much lower than normal bg readings, and with the current sliding scale starting at 70, [name] is concerned that pt's bg will crash.    [name] reported that before dinner last night, pt's bg was 80. Before breakfast this morning, pt's bg was 114.    [name] said she will bring a log of pt's bg readings and novolog administration logs to dental appt on Friday so provider can review.    [name] asked if she can give 10 units of Novolog instead of 15 units (before meals) until pt's next appt.    Cb# 1 [phone]  Routed to nursing.

## 2021-04-06 NOTE — Telephone Encounter (Signed)
Called and spoke with Redgie Grayer verbal per PCP to decrease Novolog to 10 units  [name] verbalized understanding     Leonard Schwartz, RN

## 2021-04-06 NOTE — Telephone Encounter (Signed)
Called and spoke with [name]  Increased Metformin 9/21- BGs have been under 200  9/27- dinner 80  9/28- breakfast 113          -lunch 133    [name] is VERY concerned that patient is going to "crash"  Patient is eating regularly, asymptomatic  Reassured patient that it sounds like BGs have been stable  [name] has a log of BGs that she will drop off on Friday while here at dental appt  Requesting Novolog to be decreased to 10 units (currently 15units)    Routing to PCP to advise    Leonard Schwartz, RN

## 2021-04-08 ENCOUNTER — Encounter: Payer: Self-pay | Admitting: Oncology

## 2021-04-08 ENCOUNTER — Telehealth: Payer: Self-pay

## 2021-04-08 DIAGNOSIS — Z9911 Dependence on respirator [ventilator] status: Secondary | ICD-10-CM

## 2021-04-08 NOTE — Telephone Encounter (Signed)
[name] says that pt head rest to wheelchair is not working properly. She says that they were able to have a technician come and repair the headrest, but their feedback was that the type of headrest is not a good fit for the patient.     [name] would like if pt can be referred in [facility] PT for [facility] assessment.     Will route to PCP for review.

## 2021-04-08 NOTE — Telephone Encounter (Signed)
error 

## 2021-04-11 NOTE — Addendum Note (Signed)
Addended by: Homero Fellers A on: 04/11/2021 08:38 AM     Modules accepted: Orders

## 2021-04-13 ENCOUNTER — Encounter: Payer: Self-pay | Admitting: Gastroenterology

## 2021-04-13 ENCOUNTER — Other Ambulatory Visit: Payer: Self-pay | Admitting: Pediatrics

## 2021-04-14 ENCOUNTER — Telehealth: Payer: Self-pay | Admitting: Oncology

## 2021-04-14 NOTE — Telephone Encounter (Signed)
Incoming [facility] for [facility]  Date Received: 04/11/21    Type of paperwork (sender:form type): [facility] Novolog    Completed paperwork to be sent to:  Fax to [phone]    Special Requests:     Placed in box for: Angel Wells

## 2021-04-14 NOTE — Telephone Encounter (Signed)
Paperwork completed 04/14/2021  Sent as outlined in prior documentation

## 2021-04-15 ENCOUNTER — Other Ambulatory Visit: Payer: Self-pay | Admitting: Oncology

## 2021-04-15 ENCOUNTER — Other Ambulatory Visit: Payer: Self-pay | Admitting: Pediatrics

## 2021-04-15 DIAGNOSIS — M2450 Contracture, unspecified joint: Secondary | ICD-10-CM

## 2021-04-15 DIAGNOSIS — J45909 Unspecified asthma, uncomplicated: Secondary | ICD-10-CM

## 2021-04-18 MED ORDER — NEBULIZER MISC
5 refills | Status: DC
Start: 2021-04-18 — End: 2022-06-23

## 2021-04-18 NOTE — Telephone Encounter (Signed)
Paperwork completed 04/18/2021  Sent as outlined in prior documentation    Faxed script to [facility] [phone]

## 2021-04-20 ENCOUNTER — Telehealth: Payer: Self-pay | Admitting: Oncology

## 2021-04-20 ENCOUNTER — Other Ambulatory Visit: Payer: Self-pay | Admitting: Pediatrics

## 2021-04-20 NOTE — Telephone Encounter (Signed)
Incoming call from Jacques Earthly, pt's PDN asking for PCP's NPI and license numbers as [name] is working on pt's cough machine repairs.    Writer verbally provided this info.

## 2021-04-26 ENCOUNTER — Telehealth: Payer: Self-pay | Admitting: Oncology

## 2021-04-26 NOTE — Telephone Encounter (Signed)
Angel Wells called nad asked if he could get his Covid booster on 10/24 at visit for PT at 1:30 or around that time. I told her I don't believe we have the booster available here right now but would have nursing follow up with that as well.

## 2021-04-26 NOTE — Telephone Encounter (Signed)
[name] says that guaifenesin is not being given when pts routine nurses are not working.  They feel that this is a benefit to pt due to his constant  Secretions. They would like to have the order for QC Medifin 400mg  BID and Q4hour PRN.    Notified that we should be able to give pt COVID booster on Monday while at PT appt.   Nurse visit booked.

## 2021-04-26 NOTE — Addendum Note (Signed)
Addended by: Homero Fellers A on: 04/26/2021 03:57 PM     Modules accepted: Orders

## 2021-04-26 NOTE — Telephone Encounter (Signed)
[facility] TELEPHONE INTAKE    Reason for call: [name] called and says she needs orders Robutusin-Gudifenesin 400mg  twcie as a routine and PRN every four hours  Name of caller:  Phone:[phone]

## 2021-04-27 ENCOUNTER — Other Ambulatory Visit: Payer: Self-pay | Admitting: Pediatrics

## 2021-04-27 ENCOUNTER — Telehealth: Payer: Self-pay | Admitting: Oncology

## 2021-04-27 DIAGNOSIS — J4540 Moderate persistent asthma, uncomplicated: Secondary | ICD-10-CM

## 2021-04-27 NOTE — Telephone Encounter (Signed)
Incoming paperwork for completion to the [facility]  Date Received: 04/27/2021    Type of paperwork (sender:form type): [employer] Missed Visits 10/8, 10/15, and 10/16    Completed paperwork to be sent to:  Fax to [phone]    Special Requests:     Placed in box for: [name]

## 2021-04-27 NOTE — Telephone Encounter (Deleted)
[facility] TELEPHONE TRIAGE     Reason for call: Honey requested to receive  Rx conti documentation from physician "that frequent continuous monitoring of oxygen levels or monitoring cannot be achieved by using spot check oximeter letter signed by PCP.       Name of caller: Honey for [name]  Relationship to patient: Engineer, site: [employer]  Phone: [phone]  Fax: [phone]

## 2021-04-28 NOTE — Telephone Encounter (Signed)
Notified [facility] that PCP is willing to sign off on updated order. Angel Wells says that she will send over updated med orders for approval and signature.

## 2021-05-02 ENCOUNTER — Telehealth: Payer: Self-pay | Admitting: Oncology

## 2021-05-02 ENCOUNTER — Other Ambulatory Visit: Payer: Self-pay

## 2021-05-02 ENCOUNTER — Ambulatory Visit: Payer: Medicare Other

## 2021-05-02 ENCOUNTER — Ambulatory Visit: Payer: Medicare Other | Attending: Oncology | Admitting: Rehabilitative and Restorative Service Providers"

## 2021-05-02 DIAGNOSIS — G8250 Quadriplegia, unspecified: Secondary | ICD-10-CM

## 2021-05-02 DIAGNOSIS — Z23 Encounter for immunization: Secondary | ICD-10-CM | POA: Insufficient documentation

## 2021-05-02 NOTE — Telephone Encounter (Signed)
Incoming paperwork for completion to the [facility]  Date Received: 05/02/2021    Type of paperwork (sender:form type): [facility] Home health Certificate and Plan of Care 05/01/21-06/29/21    Completed paperwork to be sent to:  Fax to [phone]    Special Requests:     Placed in box for: [name]

## 2021-05-02 NOTE — Telephone Encounter (Signed)
[facility] TELEPHONE TRIAGE     Reason for call: Honey requested to receive documentation supporting the Rx order for patients continuous oxygen monitor. Honey requests a letter is written stating "that frequent continuous monitoring of oxygen levels or monitoring cannot be achieved by using spot check oximeter". Honey states this can be written in letter form or included in patients last office visit notes. Honey states this letter is needed for insurance and billing purposes. Honey requests letter is signed by PCP and faxed to the provided number.       Name of caller: Honey for [name]  Relationship to patient: Engineer, site: [employer]  Phone: [phone]  Fax: [phone]
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
## 2021-05-02 NOTE — Progress Notes (Signed)
Covid Immunization     Angel Wells is here today for COVID vaccine.     immunization reviewed and discussed.  Questions answered.    Verbal consent obtained.    Pt tolerated well. No further needs.      Signed: Oleh Genin, RN on 05/02/2021 at 2:11 PM

## 2021-05-02 NOTE — Progress Notes (Signed)
[facility]   Physical Therapy Note      05/02/21 1300   Overview   Diagnosis Spastic quadriplegia   Insurance Medicare;Medicaid   Script Date 04/11/21   Visit # 1   Accompanied by Delorse Lek (patient's nurses)   Additional Comments Evaluation   Pain Assessment   Revised FLACC - Face 0   Revised FLACC - Legs 0   Revised FLACC - Activity 0   Revised FLACC - Cry 0   Revised FLACC - Consolability 0   Revised FLACC Score 0   Patient Education   Educated in proper posture and body mechanics Yes   Additional Patient Education Educated on potential use of a head master collar or alternate cervical collar for neck positioning in wheelchair   Time Calculations   PT Timed Codes 0   PT Untimed Codes 30   PT Total Treatment 30   Buck Mam, PT, DPT

## 2021-05-03 ENCOUNTER — Other Ambulatory Visit: Payer: Self-pay | Admitting: Pediatrics

## 2021-05-03 ENCOUNTER — Telehealth: Payer: Self-pay | Admitting: Oncology

## 2021-05-03 DIAGNOSIS — J4540 Moderate persistent asthma, uncomplicated: Secondary | ICD-10-CM

## 2021-05-03 NOTE — Telephone Encounter (Signed)
Incoming paperwork for completion to the [facility]  Date Received: 05/03/2021    Type of paperwork (sender:form type): [facility] Missed Visit Note 05/02/21    Completed paperwork to be sent to:  Fax to [phone]    Special Requests:     Placed in box for: [name]

## 2021-05-03 NOTE — Telephone Encounter (Signed)
Incoming paperwork for completion to the [facility]  Date Received: 05/03/2021    Type of paperwork (sender:form type): [facility] Revision to Plan of Care    Completed paperwork to be sent to:  Fax to [phone]    Special Requests:     Placed in box for: [name]

## 2021-05-04 ENCOUNTER — Telehealth: Payer: Self-pay | Admitting: Oncology

## 2021-05-04 ENCOUNTER — Encounter: Payer: Self-pay | Admitting: Gastroenterology

## 2021-05-04 NOTE — Telephone Encounter (Signed)
[facility] TELEPHONE TRIAGE     Reason for call: [name] requested to speak Irving Burton PT. [name] states she needs to "relay a contact" to Jolly. [name] refused to provide further information regarding call and contact.     Name of caller: Virgina Organ. for [name]  Relationship to patient: RN  Phone: [phone]

## 2021-05-05 NOTE — Telephone Encounter (Signed)
Outgoing paperwork:    Completed paperwork received on  05/05/2021  Sent to scanning? Yes  Sent as outlined in prior documentation: Fax to [phone]    Winferd Humphrey  4:29 PM  05/05/2021

## 2021-05-05 NOTE — Telephone Encounter (Signed)
Outgoing paperwork:    Completed paperwork received on  05/05/2021  Sent to scanning? Yes  Sent as outlined in prior documentation: Fax to [phone]    Chiyeko Ferre  4:30 PM  05/05/2021

## 2021-05-05 NOTE — Telephone Encounter (Addendum)
Writer spoke to Jacques Earthly, Charity fundraiser. [name] states there is a deadline from [employer] of 11/02. [name] requests the supporting documentation is placed in an office visit note. [name] states she has received calls from multiple [employer] representatives requesting to receive chart notes with supporting documentation for the patients oximeter.   F - [phone]  P - [phone]

## 2021-05-05 NOTE — Telephone Encounter (Signed)
Outgoing paperwork:    Completed paperwork received on  05/05/2021  Sent to scanning? Yes  Sent as outlined in prior documentation: Fax to [phone]    Winferd Humphrey  4:30 PM  05/05/2021

## 2021-05-05 NOTE — Telephone Encounter (Signed)
Office visit note faxed to the provided numbers. Confirmation received.

## 2021-05-06 ENCOUNTER — Telehealth: Payer: Self-pay | Admitting: Oncology

## 2021-05-06 NOTE — Telephone Encounter (Addendum)
[facility] TELEPHONE TRIAGE     Reason for call: Angel Wells states staff need supporting documentation stating how long the patient will be utilizing continuous oximeter. Angel Wells inquired if usage is short term or long term. Connie requests addended office visit note is sent to the provided fax numbers.     Name of caller: Angel Wells for [name]  Relationship to patient: Air traffic controller: [employer]  Phone:  [phone]  Fax:   [phone]  [phone]

## 2021-05-09 NOTE — Telephone Encounter (Signed)
Paperwork completed 05/09/2021  Sent as outlined in prior documentation

## 2021-05-10 ENCOUNTER — Ambulatory Visit: Payer: Medicare Other | Attending: Oncology

## 2021-05-10 ENCOUNTER — Other Ambulatory Visit: Payer: Self-pay

## 2021-05-10 ENCOUNTER — Other Ambulatory Visit: Payer: Self-pay | Admitting: Oncology

## 2021-05-10 DIAGNOSIS — Z8701 Personal history of pneumonia (recurrent): Secondary | ICD-10-CM

## 2021-05-10 DIAGNOSIS — G8250 Quadriplegia, unspecified: Secondary | ICD-10-CM | POA: Insufficient documentation

## 2021-05-10 NOTE — Patient Instructions (Signed)
The Patient was provided with the following items:  Device  Side: N/A  Size:: Large  Model:: Headmaster cervical collar  Part Number: [id number]  Warranty:: 90 Days  Quantity: 1  Status: Delivered  [facility]      Your physician has determined that you require Prefabricated (off-the-shelf) Orthotic and Prosthetic (O&P) devices and/or Durable Medical Equipment (DME).  O&P devices include items such as braces for the spine or limbs, while DME includes items such as canes, crutches and walkers.  For your convenience you were fitted by the [facility] Department of Orthotics and Prosthetics.    Return Policy:    Prefabricated O&P and DME items are not returnable if used outside of clinic due to hygiene concerns.    Special or custom orders are not returnable or refundable.    O&P devices and DME purchased from the [facility] Orth[facility] and Prosthetics Department may only be exchanged if the item is faulty or poorly fitting, as determined by the O&P Department Clinical Coordinator.  Exchanges will be made using the same type of device, if within 7 days of the purchase date.    No exchange will be issued for items that were not used in accordance with the manufacturers recommended standard of care.    Replacement or repair of minor parts could become necessary due to wear.  This may include a charge and an order from you physician may be required.    Patient Instructions for HEADMASTER COLLAR  Purpose of device   A headmaster collar is designed to provide head support due to muscle weakness.  This type of cervical collar has an open design which makes it low profile.  The flexible frame is adjustable that your orthotist will size and fit your collar to you.       Wearing your headmaster:  Collar should be applied directly against the skin.  Smaller radius of wire frames fits beneath chin with larger radius fitting against chest.  Velcro strap should be secured and fastened around back of neck.     Cleaning and  Care  Skin should be washed daily to prevent skin irritation.  Chin cover and back strap can be removed for washing.  Hand washing in mild detergent and air drying is recommended.  Frame of collar can be cleaned with cloth as needed.     Frequency / Duration of use   Wear your headmaster collar as indicated by your physician who will determine the overall length of time you will need to use your collar.    Potential Risks / Benefits   You should discontinue use of wearing your collar if pain and/or skin irritation develops.      How to report potential failure/malfunction   If you have any concerns or questions regarding the fit of your head master collar please contact [facility] at [phone]

## 2021-05-10 NOTE — Progress Notes (Signed)
Walk-in Visit  [facility]  [phone]    Patient name: Angel Wells   MRN: [id number]      ICD-10-CM ICD-9-CM   1. Spastic quadriplegia  G82.50 344.00       Pain    05/10/21 1612   PainSc:   0 - No pain       The Patient was provided with the following items:  Device  Side: N/A  Size:: Large  Model:: Headmaster cervical collar  Part Number: [id number]  Warranty:: 90 Days  Quantity: 1  Status: Delivered    Functional Goals: Provide joint stability    ROM settings: N/A    Expected Frequency / Duration of Use:   Daily or Per physician orders    Modifications made: No modifications were required at this time    Complexity:   Fitting was routine, requiring little to no adjustments    Yes, Device was inspected for safety/security and found to be functioning properly    Ordered/Delivered: Device Delivered    The patient given verbal and written instructions.  The following instructions were reviewed: Purpose of device  Cleaning / Care of device  Potential Risks / Benefits  Frequency / Duration of use  How to report potential failure / malfunctions      Alberteen Sam

## 2021-05-11 ENCOUNTER — Other Ambulatory Visit: Payer: Self-pay | Admitting: Oncology

## 2021-05-11 DIAGNOSIS — E1165 Type 2 diabetes mellitus with hyperglycemia: Secondary | ICD-10-CM

## 2021-05-11 MED ORDER — TOBI 300 MG/5ML IN NEBU
300.0000 mg | INHALATION_SOLUTION | Freq: Two times a day (BID) | RESPIRATORY_TRACT | 5 refills | Status: DC
Start: 2021-05-11 — End: 2021-07-13

## 2021-05-11 MED ORDER — ACCU-CHEK FASTCLIX LANCETS MISC
5 refills | Status: DC
Start: 2021-05-11 — End: 2021-06-15

## 2021-05-12 ENCOUNTER — Other Ambulatory Visit: Payer: Self-pay | Admitting: Pediatrics

## 2021-05-12 ENCOUNTER — Telehealth: Payer: Self-pay | Admitting: Oncology

## 2021-05-12 NOTE — Telephone Encounter (Signed)
Incoming call from [place], [place] for pt.    Angel Wells reported that pt received his support collar at orthotics yesterday, and is asking for an order that pt needs to wear while seated in his wheelchair for now.     Routed to nursing.

## 2021-05-13 ENCOUNTER — Telehealth: Payer: Self-pay | Admitting: Oncology

## 2021-05-13 ENCOUNTER — Encounter: Payer: Self-pay | Admitting: Oncology

## 2021-05-13 NOTE — Telephone Encounter (Signed)
error 

## 2021-05-13 NOTE — Telephone Encounter (Signed)
Faxed order to [phone] and provided [name] with a verbal

## 2021-05-13 NOTE — Telephone Encounter (Signed)
Incoming paperwork for completion to the [facility]  Date Received: 05/13/2021    Type of paperwork (sender:form type): [facility] Revision to Plan of Care    Completed paperwork to be sent to:  Fax to [phone]    Special Requests:     Placed in box for: [name]

## 2021-05-16 ENCOUNTER — Encounter: Payer: Self-pay | Admitting: Gastroenterology

## 2021-05-16 ENCOUNTER — Telehealth: Payer: Self-pay | Admitting: Oncology

## 2021-05-16 NOTE — Telephone Encounter (Signed)
Paperwork completed 05/16/2021  Sent as outlined in prior documentation

## 2021-05-16 NOTE — Telephone Encounter (Signed)
Incoming paperwork for completion to the [facility]  Date Received: 05/16/2021    Type of paperwork:    Sender/organization: [facility]   Form type: rx clarification request - tobi. "Clarify: Treatment (on/off) cycle"    Any specific/special request? Provider call/sign and PSS fax clarification    Copy and scan before mailing? No    Completed paperwork to be sent to:  Fax to [phone]    Placed in box for: Mariann Laster Dashan Chizmar  11:01 AM  05/16/2021

## 2021-05-18 ENCOUNTER — Other Ambulatory Visit: Payer: Self-pay | Admitting: Pediatrics

## 2021-05-18 ENCOUNTER — Other Ambulatory Visit: Payer: Self-pay | Admitting: Oncology

## 2021-05-18 DIAGNOSIS — Z9911 Dependence on respirator [ventilator] status: Secondary | ICD-10-CM

## 2021-05-18 DIAGNOSIS — K219 Gastro-esophageal reflux disease without esophagitis: Secondary | ICD-10-CM

## 2021-05-18 MED ORDER — SYRINGE (DISPOSABLE) 10 ML MISC *A*
11 refills | Status: DC
Start: 2021-05-18 — End: 2022-06-23

## 2021-05-18 NOTE — Telephone Encounter (Signed)
Paperwork completed 07/28/2020  Sent as outlined in prior documentation

## 2021-05-18 NOTE — Telephone Encounter (Signed)
Incoming call from Jacques Earthly asking for a rx for 5 34mL Luer-Lok syringes a month for inflating the trach balloon.    Writer pended rx and routed to PCP.

## 2021-05-19 ENCOUNTER — Telehealth: Payer: Self-pay | Admitting: Oncology

## 2021-05-19 NOTE — Telephone Encounter (Signed)
Incoming paperwork for completion to the [facility]  Date Received: 05/19/2021    Type of paperwork:    Sender/organization: [employer]   Form type: cervical collar order      Any specific/special request? pcp sig    Copy and scan before mailing? No    Completed paperwork to be sent to:  Fax to [phone]    Placed in box for: Nathanial Rancher  3:38 PM  05/19/2021

## 2021-05-20 ENCOUNTER — Encounter: Payer: Self-pay | Admitting: Gastroenterology

## 2021-05-20 NOTE — Telephone Encounter (Signed)
Paperwork completed 05/20/2021  Sent as outlined in prior documentation

## 2021-05-23 ENCOUNTER — Telehealth: Payer: Self-pay | Admitting: Oncology

## 2021-05-23 DIAGNOSIS — J069 Acute upper respiratory infection, unspecified: Secondary | ICD-10-CM

## 2021-05-23 NOTE — Telephone Encounter (Signed)
[facility]             Telephone Encounter     History   After hours on call     05/23/21   5:50 PM  Phone number: [name] for [name] [phone]  Reason for call: ???viral illness      [name] - RN for [facility] called regarding 3 day history of increasing need for respiratory support.  Initially needing 2-3L of oxygen via trach collar  (spo2 88%), now on ventilator - (nocturnal settings).  [name] spO2 ~91-95%.  Increased tracheal secretions - white/thick  Green/yellow nasal drainage.  Endorses low grade fever  No concern for aspiration PNA - no emesis.  No concern for UTI.  Feels that this is a viral URI or sinus infection that may be going to his chest.  He appears comfortable on the ventilator and is in no distress.      Plan  Vest q4hr  Obtain sputum sample in AM (ordered)  Tylenol ATC  Increase nebulized medications and use PRNs with vesting.  Discussed need for possible admission - this was declined  Administer home covid test    Will ask RN team to assess in am - would like same day appt.  If unable to get appt will order mobile cxr, begin antibiotics, and have [name] seen at next available appt.      Home RNs will call if condition changes overnight.    Palma Holter, NP

## 2021-05-24 ENCOUNTER — Other Ambulatory Visit
Admission: RE | Admit: 2021-05-24 | Discharge: 2021-05-24 | Disposition: A | Discharge status: Home / Self Care | Payer: Medicare Other | Source: Ambulatory Visit

## 2021-05-24 ENCOUNTER — Encounter: Payer: Self-pay | Admitting: Oncology

## 2021-05-24 ENCOUNTER — Other Ambulatory Visit: Payer: Self-pay | Admitting: Oncology

## 2021-05-24 ENCOUNTER — Encounter: Payer: Self-pay | Admitting: Primary Care

## 2021-05-24 ENCOUNTER — Ambulatory Visit: Payer: Medicare Other | Attending: Primary Care | Admitting: Primary Care

## 2021-05-24 ENCOUNTER — Other Ambulatory Visit: Payer: Self-pay | Admitting: Gastroenterology

## 2021-05-24 DIAGNOSIS — J069 Acute upper respiratory infection, unspecified: Secondary | ICD-10-CM | POA: Insufficient documentation

## 2021-05-24 DIAGNOSIS — R0902 Hypoxemia: Secondary | ICD-10-CM | POA: Insufficient documentation

## 2021-05-24 DIAGNOSIS — Z9911 Dependence on respirator [ventilator] status: Secondary | ICD-10-CM | POA: Insufficient documentation

## 2021-05-24 LAB — GLUC COMMENT

## 2021-05-24 LAB — CBC
Hematocrit: 39 % — ABNORMAL LOW (ref 40–51)
Hemoglobin: 13.4 g/dL — ABNORMAL LOW (ref 13.7–17.5)
MCH: 30 pg (ref 26–32)
MCHC: 34 g/dL (ref 32–37)
MCV: 87 fL (ref 79–92)
Platelets: 202 10*3/uL (ref 150–330)
RBC: 4.5 MIL/uL — ABNORMAL LOW (ref 4.6–6.1)
RDW: 13.6 % (ref 11.6–14.4)
WBC: 7.9 10*3/uL (ref 4.2–9.1)

## 2021-05-24 LAB — BASIC METABOLIC PANEL
Anion Gap: 16 (ref 7–16)
CO2: 24 mmol/L (ref 20–28)
Calcium: 10 mg/dL (ref 9.0–10.3)
Chloride: 91 mmol/L — ABNORMAL LOW (ref 96–108)
Creatinine: 0.4 mg/dL — ABNORMAL LOW (ref 0.67–1.17)
Glucose: 86 mg/dL (ref 60–99)
Lab: 7 mg/dL (ref 6–20)
Potassium: 4.1 mmol/L (ref 3.3–5.1)
Sodium: 131 mmol/L — ABNORMAL LOW (ref 133–145)
eGFR BY CREAT: 151 *

## 2021-05-24 MED ORDER — SODIUM CHLORIDE 0.9 % IR SOLN *I*
250.0000 mL | Freq: Three times a day (TID) | 0 refills | Status: DC
Start: 2021-05-24 — End: 2021-05-25

## 2021-05-24 MED ORDER — DOXYCYCLINE CALCIUM 50 MG/5ML PO SYRP *I*
100.0000 mg | ORAL_SOLUTION | Freq: Two times a day (BID) | ORAL | 0 refills | Status: DC
Start: 2021-05-24 — End: 2021-05-24

## 2021-05-24 MED ORDER — CEFDINIR 250 MG/5ML PO SUSR *I*
300.0000 mg | Freq: Two times a day (BID) | ORAL | 0 refills | Status: AC
Start: 2021-05-24 — End: 2021-06-07

## 2021-05-24 MED ORDER — DOXYCYCLINE HYCLATE 100 MG PO CAPS *I*
100.0000 mg | ORAL_CAPSULE | Freq: Two times a day (BID) | ORAL | 0 refills | Status: AC
Start: 2021-05-24 — End: 2021-06-07

## 2021-05-24 NOTE — Telephone Encounter (Signed)
[facility]             Telephone Encounter     History   After hours on call     05/24/21   5:15 PM  Phone number: [phone]  Reason for call: Saline prescription      [name] called regarding need for prescription for saline irrigation solution to  Be administered via gtube - TID while ill to prevent hypernatremia      Prescription provided.      Palma Holter, NP

## 2021-05-24 NOTE — Telephone Encounter (Signed)
Spoke with [name] she left pt at 11pm with night nurse [name]. At that time pt still having rhonchi and increased clear to green drainage.  Pt expressed discomfort when forehead and sinus cavity palpated.  At home covid test was negative.  98.7 temp after antipyretic given.     Unable to do a inperson visit due to difficulty transferring and need for vent.    Wrtier attempted to call nurse [name] 3 times but only able to leave a message. Called father and also left message to have someone call back in order to discuss visit.

## 2021-05-24 NOTE — Telephone Encounter (Addendum)
Kait requested to speak to a nurse. Kait relayed the patient is still sick. Kait inquired if patient should be prescribed antibiotics.   [phone]

## 2021-05-24 NOTE — Telephone Encounter (Signed)
Writer spoke to [employer]. [name] inquired if the patients insulin should be held for about three days until the patient feels better.   [phone]

## 2021-05-24 NOTE — Progress Notes (Signed)
[facility]    TELEMEDICINE VISIT  CHIEF COMPLAINT     Chief Complaint   Patient presents with   . Sinus Problem     Subjective     TELEMEDICINE CONSENT     Visit being conducted by Video in lieu of a face to face visit to minimize health care worker and patient exposure to COVID-19, and conserve PPE.    Location of Patient: home  Location of Telemedicine Provider: hospital / clinical location  Other Participants in telemedicine encounter and roles: father, nursing staff    Consent was obtained from the patient to complete this telemedicine visit; including the potential for financial liability.  How did the patient provide consent? Verbal Consent Only    SUBJECTIVE     Hypoxia    Angel Wells first started with symptoms of congestion, sinus pain, rhinorrhea 2-3 days ago.   Over the past two days, he's had to escalate his O2 needs from 1L to up to 4L   Current SpO2 is high 80s-90   At baseline he is on 1L on the ventilator and RA during the day while off of the ventilator   His sputum is thin and clear -- usually it's yellow in the morning   RN notes a foul smell coming from his mouth.   Home COVID test yesterday was negative.    They have not started an increased vesting regimen, recommended q4h yesterday   Tracheal aspirate collected this morning   [facility] is there currently obtaining XR and sinus XR ordered by Homero Fellers NP -- I confirmed with their staff that they have orders. I requested that results be called into our office.    Tmax was 100F axillary   Is on inhaled tobramycin q21days   BP has been stable, last 120/84.    Pulse has been in the low 100s, he's breathing at 30 and works against the vent occasionally. He hates having his trach cuffed because he can't talk.    Emanual does not have a port or any other means of stable long-term IV access.    MEDICATIONS     Current Outpatient Medications   Medication Sig   . syringe disposable (BD LUER-LOK) 10 ML Use as instructed  for inflation of tracheostomy balloon.   . famotidine (PEPCID) 20 mg tablet take 1 tablet by mouth 2 (two) times daily. ok to crush it and put it down tube if necessary   . chlorhexidine (PERIDEX) 0.12 % solution use as directed   . levETIRAcetam (KEPPRA) 100 mg/mL solution TAKE 15 ML BY G-TUBE TWICE DAILY   . TOBI 300 MG/5ML nebulizer solution Inhale 5 mLs (300 mg total) by nebulization every 12 hours  for pseudomonas infection, trach/vented patient NEBULIZE 1 VIAL (300 MG) TWICE DAILY EVERY OTHER MONTH   DIAGNOSIS: E84.0   . Accu-Chek FastClix Lancets MISC 1 EACH BY MISC.(NON-DRUG COMBO ROUTE) ROUTE 3 (THREE) TIMES DAILY. E11.65   . ipratropium (ATROVENT) 0.02 % nebulizer solution INHALE 2.5 MLS (500 MCG TOTAL) BY NEBULIZATION 2 TIMES DAILY FOR ASTHMA DX: J45.4   . QC MEDIFIN 400 400 MG TABS give 1 tablet by mouth every 4 hours as needed for expectorant   . Nebulizer MISC Disposable nebulizer kits to be used inline with trach  MCAID ID [id number]   . baclofen (LIORESAL) 10 mg tablet take 1 tablet (10 milligram total) by mouth 2 times daily   . Insulin Glargine, 2 Unit Dial, (TOUJEO MAX SOLOSTAR) 300 UNIT/ML  injection pen Inject 110 units into the skin nightly  for Type 2 Diabetes   . LACTULOSE 10 GM/15ML solution administer 30 mls (20 g total) via g tube daily as needed (no stool in 48 hours) use as directed   . fluticasone (FLONASE) 50 MCG/ACT nasal spray spray 1 spray into nostril daily   . metFORMIN (GLUCOPHAGE) 500 MG/5ML solution Take 10 mLs (1,000 mg total) by mouth 2 times daily (with meals)  for Type 2 Diabetes   . clonazePAM (KLONOPIN) 1 mg tablet TAKE 1 TABLET BY MOUTH TWICE DAILY AS NEEDED (crush for g-tube)   . bisacodyl (DULCOLAX) 10 mg suppository Use one suppository as needed for no BM after 48 hours   . budesonide (PULMICORT) 0.25 mg/64mL nebulizer suspension INHALE 2 MLS (0.25 MG TOTAL) BY NEBULIZATION 2 TIMES DAILY FOR ASTHMA   . oxygen 1-2 L/min to keep O2 sat above 92%, may increase up to 4 lpm as  needed.  Low saturation alarm 85%, high alarm off, low HR 50, High HR 200   . albuterol (PROVENTIL) (2.5 mg/15mL) 0.083% nebulizer solution Inhale 3 mLs (2.5 mg total) by nebulization 3 times daily  for Spasm of Lung Air Passages with vesting.  When ill, use four times a day with vesting.   . SENNA 8.6 MG tablet administer 2 tablets via g tube daily   . B-D UF III MINI PEN NEEDLES 31G X 5 MM USE 3 (THREE) TIMES DAILY AS NEEDED.   Marland Kitchen generic DME Item to dispense: Stationary Continuous Oximeter  Instructions for use: Use at night time Dx: Z99.11   . generic DME Cough assist machine repairs for: [employer] respironics cough assist model T70.   Marland Kitchen valproate (VALPROIC ACID) 50 mg/mL syrup take 5 mls (250 milligram total) by mouth 3 times daily   . insulin aspart (NOVOLOG FLEXPEN) 100 UNIT/ML injection pen INJECT SQ BEFORE MEALS: NUTRITIONAL BL: 5 UNITS + SS AS FOLLOWS: <70: HOLD BL, 70-200: BL, > 200: BL+2, >250: BL+4, >300: BL+6, >350: BL +8. ALERT MD FOR BG <70 AND >350.   Marland Kitchen ibuprofen (ADVIL,MOTRIN) 600 mg tablet Take 1 tablet (600 mg total) by mouth 4 times daily as needed for Pain for up to 30 doses   . generic DME Repair, parts, and labor for coughing assist machine. MCAID CIN: [id number]   . blood glucose (ACCU-CHEK AVIVA PLUS) test strip USE 1 STRIP 3 (THREE) TIMES DAILY AND AS NEEDED.DX: E11.65 AND Z79.4   . cetirizine (ZYRTEC) 10 mg tablet Take 1 tablet (10 mg total) by mouth nightly   . Misc. Devices (PULSE OXIMETER) MISC Pulse Oximeter with Supplies, LON: 99, [id number], J96.11, Z99.11   . Syringe Disposable 60 ML MISC Use as directed   . generic DME Adhesive tape 1" x 5 yards. Use as directed. Disp 3 box   . generic DME Suction canister Use as directed.MCAID ID [id number]   . generic DME Suction tubing Use as directed.MCAID ID [id number]   . suction machine Instructions for use: Use as needed for secretions, LON: 99 months, [id number]   . lacosamide (VIMPAT) 10 mg/mL SOLN oral solution Take 10 MILLILITER per tube twice  daily. ; MAXIMUM DAILY DOSE 20 MILLILITER   . generic DME Pulse ox probe replacement tape. Use as directed.[id number], LON 99   . generic DME Pulse ox sensors (disposable)Use as directed.MCAID ID [id number], LON 99   . docusate sodium (DOCUSATE SODIUM) 10 mg/mL liquid Take 10 mLs (100 mg total) by mouth 2 times daily (Patient  not taking: Reported on 03/30/2021)   . generic DME [place] Percussion Vest Model # [id number]  Use as directed.   Marland Kitchen GLUCAGON EMERGENCY 1 MG injection kit Use as directed AS NEEDED for blood glucose < 70   . calcium carbonate 650 mg tablet Take 650 mg by mouth daily  Via G Tube   . Cholecalciferol 25 MCG (1000 UT) CHEW Take 1,000 units by mouth daily  2 chews daily   . Non-System Medication Ok to change Gtube at home by nursing staff every 3 months and PRN   . generic DME Gel Overlay Mattress.  LON: 99, [id number]. Use as directed. G82.50, S06.9x9   . MIC-KEY gastrostomy kit 14FR Please dispense every 3 months and prn, MicKey 14Fr 2.5cm   . EPINEPHrine (EPIPEN) 0.3 mg/0.3 mL auto-injector USE AS DIRECTED   . carbamide peroxide (DEBROX) 6.5 % otic solution Use 5 drops twice daily on the first 3 days of each month.   . lactobacillus rhamnosus, GG, (CULTURELLE) capsule Take 1 capsule (1 each total) by mouth daily   . nystatin (NYSTATIN) powder Apply topically 4 times daily  to the following areas: neck, groin, axilla   . sodium chloride 0.9 % nebulizer solution Inhale 3 mLs by nebulization as needed for Wheezing  MCAID ID [id number]   . triamcinolone (KENALOG) 0.025 % cream Apply topically 2 times daily  APPLY TO AFFECTED AREA (Patient not taking: Reported on 03/30/2021)   . zinc oxide (DESITIN) 40 % paste Apply topically as needed for Diaper Rash   . generic DME TRACH TIES WITH O2 PORT. Use as directed.MCAID ID [id number]   . MIC-KEY gastrostomy feeding tube extension set 12" Use as directed with gtube   . generic DME Adult size trach ties Use as directed.   Marland Kitchen generic DME Butterfly Harness for use in  wheelchair, dispense 1. Use as directed. Dx G80.0, S06.9x9, LON: lifetime   . MIC-KEY gastrostomy feeding tube extension set 24" Use as directed, please dispense 4 per month   . gauze pads 2"X2" Use as directed around trach. MCAID ID [id number]   . Nebulizers (PARI LC PLUS VIOS PRO NEB) MISC Pari Vios Pro Neb or comparable with Nebulizer kits and nebulizer trach mask. Use twice daily With albuterol.MCAID # [id number], MCARE [id number]   . generic DME 15mm Adapter for trach to be used as directed. Replace weekly. Dx.Z99.11   . generic DME 15 mm adapters for nebulized therapies via trach  Medicare ID [id number], Medicaid ID [id number]   . nebulizer device with mask and tubing Use as directed   . generic DME Please dispense one 50mL syringe. Use as directed. (Patient not taking: Reported on 03/30/2021)   . generic DME MCAID ID [id number], BIVONA CUFFED TRACH 7.0 Please change trach every month and as needed.   Marland Kitchen generic DME Please dispense 30mL measuring cup. Use as directed.   . Gauze Pads & Dressings (SPLIT GAUZE DRAINAGE SPONGE) 4"X4" Use once and PRN times a day as instructed.   Marland Kitchen generic DME 10 isothermal adult "antiflect" connector. MCAID [id number] Use as directed.   Marland Kitchen generic DME Suction catheter with chimney valve.Use as directed.MCAID ID [id number]   . generic DME Non-sterile gloves size medium Use as directed. MCAID ID [id number] 3boxes/month   . gauze sponge (CURITY) 4"X4" pads Use PRN as instructed.MCAID ID [id number]   . generic DME PASSEY MUIR VALVE (PURPLE) Use as tolerated.MCAID ID [id number]   . Adhesive Tape 1"x5yd TAPE By 1 Units no  specified route as needed MCAID ID [id number]   . generic DME 10 ml syringes Use as directed.MCAID ID [id number]   . generic DME Trach care kits Use as directed.MCAID ID [id number]   . surgical lubricant (SURGILUBE) gel Apply topically as needed MCAID ID [id number]   . Distilled Water LIQD Distilled water, use as directedMCAID ID [id number]   . generic DME Mepilex 4X 4. Use as directed.MCAID ID  [id number]   . generic DME HME for trach humidification Use as directed.MCAID ID [id number]   . generic DME Cough Assist circuits Use as directed.MCAID ID [id number]   . generic DME Suction filter Use as directed.MCAID ID [id number]   . generic DME Nasal adapters Use as directed.MCAID ID [id number]   . diphenhydrAMINE (BENADRYL) 25 MG oral solid 25 mg by Per G Tube route nightly as needed for Sleep (Patient not taking: Reported on 03/30/2021)   . FIBER SELECT GUMMIES PO Take by mouth Give 2 gummies daily   . Multiple Vitamins-Minerals (HM MULTIVITAMIN ADULT GUMMY PO) Take by mouth Give 2 gummies daily   . Ascorbic Acid (VITAMIN C ADULT GUMMIES PO) Take 250 mg by mouth daily  Give 2 gummies once daily    . cetirizine (ZYRTEC) 1 MG/ML syrup 10 mg by Per G Tube route at bedtime   . oxygen 2 l/m bleed through ventilator at night   . Blood Glucose Monitoring Suppl (FIFTY50 GLUCOSE METER 2.0) w/Device KIT by Other route   . miconazole (MICATIN) 2 % powder 2 times daily as needed    . insulin pen needle (FIFTY50 PEN NEEDLES) 31G X 8 MM 1 each     Medications reviewed at today's visit       Objective     OBJECTIVE   PHYSICAL EXAM  This visit was performed during a pandemic event, and the physical exam was limited to my video observation of this patient's organ systems and/or body areas.   Gen:  comfortable, in no distress.   Eye: clear conjunctivae, anicteric sclerae   ENT: MMM  Resp: breathing comfortably       ASSESSMENT   Today we discussed the assessment and management of the following diagnosis:    ICD-10-CM ICD-9-CM   1. Hypoxia  R09.02 799.02   2. Ventilator dependent  Z99.11 V46.11     PLAN     Hypoxia  Ventilator     Reviewed his past sensitivities at length.    Shared that given his oxygen requirement ***   Weighing the risks and benefits of utilizing a fluoroquinolone in the setting of known allergy and Pa resistance, I recommend against using cipro/levofloxacin   Given his MDR Pa, use of meropenem should be approached  with great caution. This also cannot be given in a timely manner as Eray does not have any IV access.   We also discussed that it's difficult to tell whether Pa is colonozing vs causing infection.   Therefore, I recommend targeting tradional community-acquired pathogens, given that his hemodynamics are stable and symptoms are relatively mild per family. He has full time nursing for close observation.   Will prescribe cefinir and doxycycline.   CXR result followed up after our visit, I called dad to communicate results of suspected pneumonia and confirmed his ability to fill antibiotic prescriptions (issues due to PA, formulary). I was also glad to hear that Angel Wells's respirations had slowed.    Will ask our nursing team to follow [place] carefully.   Check CBC, BMP, trach aspirate (already  ordered)    Orders Placed This Encounter   . CBC and differential   . Basic metabolic panel   . cefdinir (OMNICEF) 250 MG/5ML suspension   . doxycycline hyclate (VIBRAMYCIN) 100 mg capsule       There are no Patient Instructions on file for this visit.           I personally spent *** minutes, on the calendar day of the encounter, including PreVisit, Visit, and Post visit work, in the care of this patient.      No follow-ups on file.      [name], MD

## 2021-05-24 NOTE — Telephone Encounter (Signed)
[facility] TELEPHONE TRIAGE     Reason for call: [name] requested to confirm an imaging report was successfully faxed to the [facility]. Writer confirmed report was received and placed with provider for review. Angel Wells states she received a call from [place] [employer]. [name] states [name] was very rude and demanding with imaging tech on the phone. [name] states [name] placed a verbal order for chest and sinus x-ray. [name] states she was under the impression [name] worked for [facility] as [name] said she would have orders faxed over from [facility] sometime today. [name] states chest x-ray was completed and [facility] will need an order for completed imaging by end of day.     Name of caller: Angel Wells for Angel Wells  Relationship to patient: RN  Organization: [facility]  Phone: [phone]

## 2021-05-24 NOTE — Telephone Encounter (Signed)
Spoke with [name] she says that pt is still not feeling well this day. Received tobramycin every 21 days. Last given on 11/3 and next due on 11/23.  Has order for sputum culture.   Was given a verbal for CXR that needs to be signed. This can be completed at visit this day with Dr  Beaulah Corin.    Would like to draw lab work on pt. Writer advised that they should have the visit first with Beaulah Corin in order to clarify which labs are needed. Will set bag of supplies up front for pick up from home nurse.     Discussed with Dr Beaulah Corin that visit has been added to schedule.

## 2021-05-24 NOTE — Telephone Encounter (Signed)
Spoke with nurse she says that pt is eating now but wanted to know if directions would change if pt stopped eating. Advised that they should call back if pt stops eating.   Understanding verbalized.

## 2021-05-24 NOTE — Progress Notes (Signed)
[facility]  Prior Authorization Request    Date of request: 05/24/2021  Time of call: 12:40 PM    Medication Requiring PA: doxycycline (Vibramycin)  50MG /5ML Syrup    Directions(dosing and frequency): Take 10 mLs (100 mg total) by mouth every 12 hours for 14 days for Pneumonia    Quantity/ Day Supply: 280 mL    Dx code:    Request received from:[facility] PA Source: Pharmacy    Name and Phone Number of Pharmacy: [facility] [phone]      Additional Pharmacy Benefit plan: no    Secondary insurance: Yes    Please run secondary if not covered under primary: yes  Medicaid    Insurance coverage confirmed: yes    Covered Alternative Medication:     Tax ID: [id number]    Provider and NPI: [id number]    Additional Justification:n/a    Key ID: [id number]    Please provide justification for PA submission & urgency or indicate which covered alternative was ordered

## 2021-05-25 ENCOUNTER — Encounter: Payer: Self-pay | Admitting: Oncology

## 2021-05-25 LAB — GRAM STAIN: Gram Stain: 0

## 2021-05-25 LAB — INFLUENZA B PCR: Influenza B PCR: 0

## 2021-05-25 LAB — INFLUENZA A: Influenza A PCR: 0

## 2021-05-25 LAB — RSV PCR: RSV PCR: POSITIVE — AB

## 2021-05-25 MED ORDER — SODIUM CHLORIDE 0.9 % IR SOLN *I*
250.0000 mL | Freq: Three times a day (TID) | 0 refills | Status: DC
Start: 2021-05-25 — End: 2021-05-27

## 2021-05-25 NOTE — Progress Notes (Signed)
Incoming paperwork for completion to the [facility]  Date Received: 05/25/2021    Type of paperwork (sender:form type): [facility] Physician Order (Missed Visit)    Completed paperwork to be sent to:  Fax to [phone]    Special Requests:     Placed in box for: [name]

## 2021-05-25 NOTE — Progress Notes (Signed)
[facility]  Prior Authorization Request    Date of request: 05/25/2021  Time of call: 4:08 PM    Medication Requiring PA: sodium chloride 0.9% nebulizer solution    Directions(dosing and frequency): Inhale 3 mLs by nebulization as needed for Wheezing    Quantity/ Day Supply: 90 mL    Dx code: J45.909    Request received from:[facility] PA Source: Pharmacy    Name and Phone Number of Pharmacy: [facility] [phone]      Additional Pharmacy Benefit plan: no    Secondary insurance: Yes     Please run secondary if not covered under primary: yes  Medicaid    Insurance coverage confirmed: yes    Covered Alternative Medication:     Tax ID: [id number]    Provider and NPI: [place]- [id number]    Additional Justification:n/a    Key ID: [id number]    Please provide justification for PA submission & urgency or indicate which covered alternative was ordered

## 2021-05-25 NOTE — Telephone Encounter (Signed)
[facility] order faxed.

## 2021-05-25 NOTE — Addendum Note (Signed)
Addended by: Homero Fellers A on: 05/25/2021 09:19 AM     Modules accepted: Orders

## 2021-05-25 NOTE — Addendum Note (Signed)
Addended by: Benita Stabile on: 05/25/2021 09:15 AM     Modules accepted: Orders

## 2021-05-25 NOTE — Progress Notes (Signed)
Alternative med sent, can close PA

## 2021-05-25 NOTE — Telephone Encounter (Signed)
[facility] TELEPHONE TRIAGE     Reason for call: [name] states she was not able to pick up this order because it is not in stock. [name] requests Rx order is sent to [address]. [name] states the patient needs 750 ML daily. [name] requests the prescription is written for five 1000 MG bottles.     Name of caller: [name] for [facility] [place]  Relationship to patient: PDN  Phone: [phone]

## 2021-05-26 ENCOUNTER — Other Ambulatory Visit: Payer: Self-pay | Admitting: Oncology

## 2021-05-26 DIAGNOSIS — J9611 Chronic respiratory failure with hypoxia: Secondary | ICD-10-CM

## 2021-05-26 DIAGNOSIS — Z9911 Dependence on respirator [ventilator] status: Secondary | ICD-10-CM

## 2021-05-26 NOTE — Telephone Encounter (Signed)
[facility] TELEPHONE TRIAGE     Reason for call: [name] requests an order for 5 liters of oxygen is prescribed in order to keep the patient over 90. [name] inquired if the patient could receive an order for two more saline bottles for the weekend. [name] inquired if the patient should complete labs this week.     Name of caller: [name] for Angel Wells  Relationship to patient: PDN  Phone: [phone]

## 2021-05-27 ENCOUNTER — Telehealth: Payer: Self-pay | Admitting: Oncology

## 2021-05-27 ENCOUNTER — Other Ambulatory Visit: Payer: Self-pay | Admitting: Oncology

## 2021-05-27 ENCOUNTER — Encounter: Payer: Self-pay | Admitting: Gastroenterology

## 2021-05-27 DIAGNOSIS — E871 Hypo-osmolality and hyponatremia: Secondary | ICD-10-CM

## 2021-05-27 LAB — AEROBIC CULTURE: Aerobic Culture: 0

## 2021-05-27 MED ORDER — SODIUM CHLORIDE 0.9 % IR SOLN *I*
250.0000 mL | Freq: Three times a day (TID) | 0 refills | Status: DC
Start: 2021-05-27 — End: 2021-05-27

## 2021-05-27 MED ORDER — SODIUM CHLORIDE 0.9 % IR SOLN *I*
250.0000 mL | Freq: Three times a day (TID) | 0 refills | Status: DC
Start: 2021-05-27 — End: 2023-04-04

## 2021-05-27 MED ORDER — METFORMIN HCL 500 MG/5ML PO SOLN *I*
1000.0000 mg | Freq: Two times a day (BID) | ORAL | 5 refills | Status: DC
Start: 2021-05-27 — End: 2021-08-25

## 2021-05-27 NOTE — Telephone Encounter (Signed)
[facility] TELEPHONE TRIAGE     Reason for call: Writer spoke to [name] who states that Angel Wells is better with the antibiotics but she would like an order for 5 liters of oxygen for the weekend to keep his stats above 90.  She also wants to know if BMP labs should be drawn on Tues when she goes back to see him?      Name of caller: [name]  Phone:  [phone]

## 2021-05-27 NOTE — Telephone Encounter (Addendum)
Spoke to [place], Charity fundraiser.  Relayed that fever is down.  Was alternating tylenol/motrin.  Not giving around the clock, but about every 6-8 hours.  Axillary of 100.50F.    Seems more responsive; eating better.  BG stable    Secretions still thick and yellow.  O2 at 4L 89-90%  HR 80  Nasal drainage clear.    Tobi started early (set to start on 24th)  Tobi and omnicef started Tuesday (15th).  Doxy started Wednesday (16th).    Hyponatremia baseline.  BMP on Tuesday?  Other labs?  2 more saline bottles to [facility] [address].    Relayed message from PCP to seek ED eval if symptoms had no greatly improved.  Brett Canales, pt's dad on speakerphone stating "the hospital is not an option."  Will route back to PCP.

## 2021-05-27 NOTE — Telephone Encounter (Signed)
Handled in separate encounter.  [name] was able to take verbal order.

## 2021-05-27 NOTE — Telephone Encounter (Signed)
Spoke to [place], Charity fundraiser.  Relayed recent message from PCP.  Family was unaware of RSV positive results.  Aware of PNA seen on CXR > will continue antibiotics.    Reviewed that temp >100.70F recommended ED eval.  If O2 requirements increase at all, ED eval.  Aware of BMP ordered.    Inquired if writer should speak to pt's dad.  [name] will relay message to dad.  Will plan to check base next week.

## 2021-05-27 NOTE — Addendum Note (Signed)
Addended by: Katha Cabal on: 05/27/2021 03:31 PM     Modules accepted: Orders

## 2021-05-27 NOTE — Telephone Encounter (Signed)
[name], MD  P [facility]  Can you guys please check in on Angel Wells every 1-2 days to ensure he's improving? He was on the cusp of hospitalization at our visit. I can be your point person as I saw him moving forward for this.

## 2021-05-27 NOTE — Addendum Note (Signed)
Addended by: Blossom Hoops on: 05/27/2021 02:05 PM     Modules accepted: Orders

## 2021-05-27 NOTE — Telephone Encounter (Signed)
Rx sent for saline.  Order for BMP placed -- I'd recommend checking this on Monday or Tuesday.  It appears that Angel Wells has RSV. His XR is indicative of a pneumonia, so I do recommend ongoing use of antibiotics.  I'd recommend that if his oxygen needs increase at all OR if he starts to have temperatures >100.24F I would strongly recommend hospitalization.    [name], MD

## 2021-05-27 NOTE — Progress Notes (Signed)
Paperwork completed 05/27/2021  Sent as outlined in prior documentation

## 2021-05-28 LAB — LEGIONELLA CULTURE: Legionella Culture: 0

## 2021-05-30 ENCOUNTER — Other Ambulatory Visit: Payer: Self-pay | Admitting: Pediatrics

## 2021-05-30 ENCOUNTER — Encounter: Payer: Self-pay | Admitting: Oncology

## 2021-05-30 NOTE — Progress Notes (Signed)
Incoming call from [place], Charity fundraiser.  [name] reported that pt's temp is down and has no fever,. Pt is on 3.5L of O2, and is eating three times daily.  [name] reported that Pt still has a lot of secretions from nose/lungs.  [name] thinks he's doing great and wants to thank clinical team for their help.    Routed to nursing.    cb#[phone]

## 2021-05-31 ENCOUNTER — Encounter: Payer: Self-pay | Admitting: Gastroenterology

## 2021-05-31 ENCOUNTER — Other Ambulatory Visit
Admission: RE | Admit: 2021-05-31 | Discharge: 2021-05-31 | Disposition: A | Discharge status: Home / Self Care | Payer: Medicare Other | Source: Ambulatory Visit | Attending: Primary Care | Admitting: Primary Care

## 2021-05-31 ENCOUNTER — Telehealth: Payer: Self-pay | Admitting: Oncology

## 2021-05-31 DIAGNOSIS — E871 Hypo-osmolality and hyponatremia: Secondary | ICD-10-CM | POA: Insufficient documentation

## 2021-05-31 DIAGNOSIS — R0902 Hypoxemia: Secondary | ICD-10-CM | POA: Insufficient documentation

## 2021-05-31 DIAGNOSIS — Z9911 Dependence on respirator [ventilator] status: Secondary | ICD-10-CM | POA: Insufficient documentation

## 2021-05-31 LAB — BASIC METABOLIC PANEL
Anion Gap: 17 — ABNORMAL HIGH (ref 7–16)
CO2: 24 mmol/L (ref 20–28)
Calcium: 9.1 mg/dL (ref 9.0–10.3)
Chloride: 98 mmol/L (ref 96–108)
Creatinine: 0.29 mg/dL — ABNORMAL LOW (ref 0.67–1.17)
Glucose: 99 mg/dL (ref 60–99)
Lab: 5 mg/dL — ABNORMAL LOW (ref 6–20)
Potassium: 4.3 mmol/L (ref 3.3–5.1)
Sodium: 139 mmol/L (ref 133–145)
eGFR BY CREAT: 167 *

## 2021-05-31 NOTE — Telephone Encounter (Signed)
incoming call from [place], [place].  [name] reported that pt is on 2L of oxygen.  Secretions are much less  Lungs are nearly clear.    [name] asked if normal saline can be d/c after today? Pt doesn't have any more after today.      cb# [phone]       Routed to nursing.

## 2021-05-31 NOTE — Progress Notes (Signed)
Multiple encounters  See separate telephone encounter for details    Leonard Schwartz, RN

## 2021-05-31 NOTE — Telephone Encounter (Signed)
Writer spoke with Angel Wells whom reports Angel Wells has been fever free for 24 hours, secretions are decreased, lungs sounds improving, and is currently only on 2L O2 with saturations 90-92%.     Angel Wells requesting D/C confirmation for normal saline as she will be out by this afternoon otherwise; If PCP would like to extend, additional order required; Angel Wells drew repeat BMP on patient this AM, waiting for lab pick up. Angel Wells thanked Homero Fellers and [facility] staff for assistance with keeping Angel Wells at home.     Writer routing to PCP for review.     Gary Fleet, RN

## 2021-06-01 ENCOUNTER — Telehealth: Payer: Self-pay | Admitting: Oncology

## 2021-06-01 ENCOUNTER — Encounter: Payer: Self-pay | Admitting: Oncology

## 2021-06-01 NOTE — Telephone Encounter (Signed)
Called and spoke with Angel Wells per PCP- sodium is normal - would like to stop saline, recheck in 1 week to make sure he's stable off saline.     Verbalized understanding  No questions at this time    Leonard Schwartz, RN

## 2021-06-01 NOTE — Telephone Encounter (Signed)
[facility] TELEPHONE TRIAGE     Reason for call: [name] requested to relay the patient has diarrhea most likely due to the antibiotics the patient is currently taking. [name] relayed she will call with updates on 11/28.    Name of caller: [name] for Kylo Gavin  Relationship to patient: PDN  Phone:  [phone]

## 2021-06-01 NOTE — Progress Notes (Signed)
Incoming [facility]  Date Received: 06/01/2021    Type of paperwork:    Sender/organization: [employer]    Form type: notice that [facility] allows one box of vinyl gloves per month, and if pt needs two boxes, to provide a new rx/order w dx and chart notes supporting this.    Any specific/special request? Chart notes/new rx, if PCP agrees that two boxes is necessary    Copy and scan before mailing? No    Completed paperwork to be sent to:  Fax to [phone]    Placed in box for: Nathanial Rancher  8:54 AM  06/01/2021

## 2021-06-01 NOTE — Telephone Encounter (Signed)
[facility] TELEPHONE INTAKE    Reason for call: Would like to confirm what is going to happen now that labs are back. [name] states if normal saline is going to be continued will need another script called in today. Please send to [facility] in [place]. Please advise    Name of caller: [name]  Relationship to patient: Nurse  Organization (if applicable):   Phone:  [phone]  Fax (if applicable):

## 2021-06-02 ENCOUNTER — Other Ambulatory Visit: Payer: Self-pay | Admitting: Oncology

## 2021-06-02 DIAGNOSIS — E1165 Type 2 diabetes mellitus with hyperglycemia: Secondary | ICD-10-CM

## 2021-06-02 DIAGNOSIS — Z794 Long term (current) use of insulin: Secondary | ICD-10-CM

## 2021-06-03 ENCOUNTER — Telehealth: Payer: Self-pay | Admitting: Internal Medicine

## 2021-06-03 ENCOUNTER — Other Ambulatory Visit: Payer: Self-pay | Admitting: Neurology

## 2021-06-03 DIAGNOSIS — G40219 Localization-related (focal) (partial) symptomatic epilepsy and epileptic syndromes with complex partial seizures, intractable, without status epilepticus: Secondary | ICD-10-CM

## 2021-06-03 NOTE — Progress Notes (Signed)
[name], nurse from patients group home, called about buses on ice. She would like to continue three times daily budesonide instead of usual twice daily. I gave a verbal order to continue three times daily.

## 2021-06-06 ENCOUNTER — Other Ambulatory Visit: Payer: Self-pay | Admitting: Oncology

## 2021-06-06 ENCOUNTER — Telehealth: Payer: Self-pay | Admitting: Optometry

## 2021-06-06 DIAGNOSIS — J9611 Chronic respiratory failure with hypoxia: Secondary | ICD-10-CM

## 2021-06-06 DIAGNOSIS — J9600 Acute respiratory failure, unspecified whether with hypoxia or hypercapnia: Secondary | ICD-10-CM

## 2021-06-06 DIAGNOSIS — Z9911 Dependence on respirator [ventilator] status: Secondary | ICD-10-CM

## 2021-06-06 MED ORDER — GENERIC DME *A*
5 refills | Status: DC
Start: 2021-06-06 — End: 2023-10-10

## 2021-06-06 MED ORDER — NYSTATIN 100000 UNIT/GM EX CREA *I*
TOPICAL_CREAM | Freq: Two times a day (BID) | CUTANEOUS | 11 refills | Status: DC
Start: 2021-06-06 — End: 2022-06-23

## 2021-06-06 MED ORDER — LACOSAMIDE 10 MG/ML PO SOLN *I*
ORAL | 5 refills | Status: DC
Start: 2021-06-06 — End: 2021-07-13

## 2021-06-06 NOTE — Addendum Note (Signed)
Addended by: Homero Fellers A on: 06/06/2021 02:21 PM     Modules accepted: Orders

## 2021-06-06 NOTE — Telephone Encounter (Signed)
Agree with below.  Additionally I don't think there would be a benefit to continuing oral antibiotics. If he clinically isn't improving he should have a visit to determine next steps.  [name], MD

## 2021-06-06 NOTE — Telephone Encounter (Signed)
Angel Wells called and says [name] has not had fever in two days. Anti biotics for his pneumonia and RSV stop tomorrow but he has a lot of secretion from his nose and lungs. Angel Wells is scheduled to do a bmp tomorrow. Angel Wells wants to know if they need a chest x ray or to stop the antibiotics. She says he has a yeast rash on his butt as well. Angel Wells is requesting a call back.  Call back [phone]

## 2021-06-06 NOTE — Progress Notes (Signed)
Paperwork completed 06/06/2021  Sent as outlined in prior documentation

## 2021-06-06 NOTE — Telephone Encounter (Signed)
06/06/2021    I wanted you to be aware that the following patient has cancelled their appointment & rescheduled 02/09/22.    Provider Name: Dr. Olena Leatherwood   Date of Appointment: 06/08/21   Patient Name: Angel Wells   MRN: [id number]   DOB: 16-Aug-1990       Reason for Cancellation:  Not feeling good.        Thank you,  Margarita Grizzle

## 2021-06-06 NOTE — Telephone Encounter (Signed)
I would clarify if secretions are baseline - amount and color; I would wait a couple days to see if diarrhea stops post antibiotic - brief chart review showed no history of cdiff. - we can get a sputum sample if they are abnormal but It did not grow out anything from 11/14 - agree with increase in humidity.  I sent in nystatin for skin.  We can hold on appt at this time - if he worsens or does not improve then will need an in person appt.    Palma Holter, NP

## 2021-06-06 NOTE — Telephone Encounter (Addendum)
Spoke to [place], Charity fundraiser.  Pt still with yellow/white/clear tracheal and nasal secretions.  Colonized with pseudomonas > feels some of this may be baseline.  More baseline behaviors.  Asked to be put on vent over weekend.  [name] wasn't there.    RT came out today and changed PEEP to 7.  Trying to get more humidity with nebs.  Will bring letter into office today with 2 prescription needs.    14 days abx.  Diarrhea all the time.    BMP tomorrow  [name] will collect sputum just in case it's needed.  Doesn't feel it's c. Diff.    Yeasty bottom rash.  Using barrier creams.  Does not have prescription antifungal.    Will route to providers to determine if:  1. CXR, sputum and/or stool culture is needed at this time.  2. Abx should be continued  3.  Antifungal (or other cream) prescribed for bottom.  4.  Appt in-office is warranted at this time.

## 2021-06-07 ENCOUNTER — Telehealth: Payer: Self-pay | Admitting: Oncology

## 2021-06-07 ENCOUNTER — Other Ambulatory Visit
Admission: RE | Admit: 2021-06-07 | Discharge: 2021-06-07 | Disposition: A | Discharge status: Home / Self Care | Payer: Medicare Other | Source: Ambulatory Visit | Attending: Oncology | Admitting: Oncology

## 2021-06-07 DIAGNOSIS — Z0189 Encounter for other specified special examinations: Secondary | ICD-10-CM | POA: Insufficient documentation

## 2021-06-07 LAB — BASIC METABOLIC PANEL
Anion Gap: 15 (ref 7–16)
CO2: 23 mmol/L (ref 20–28)
Calcium: 9.4 mg/dL (ref 9.0–10.3)
Chloride: 87 mmol/L — ABNORMAL LOW (ref 96–108)
Creatinine: 0.37 mg/dL — ABNORMAL LOW (ref 0.67–1.17)
Glucose: 110 mg/dL — ABNORMAL HIGH (ref 60–99)
Lab: 17 mg/dL (ref 6–20)
Potassium: 4.2 mmol/L (ref 3.3–5.1)
Sodium: 125 mmol/L — ABNORMAL LOW (ref 133–145)
eGFR BY CREAT: 155 *

## 2021-06-07 LAB — GRAM STAIN

## 2021-06-07 LAB — AEROBIC CULTURE

## 2021-06-07 NOTE — Telephone Encounter (Signed)
Spoke to [place], Charity fundraiser.  Reported that [name] is moving back to baseline.  RA at 90%, secretions getting back to baseline.  Lungs almost clear.  Acting like normal self.  Abx finished today.  Will update pharmacy about changed to tobi rotation (started 10 days early when acutely sick).    Blood and sputum samples obtained.  Has not had diarrhea today.  Bottom rash has improved somewhat (hasn't start nystatin yet).  PEEP of 7 still.    Will pick nystatin.  Will drop off samples.  Will route to [place], RT to discuss PEEP.  Will update team.

## 2021-06-07 NOTE — Telephone Encounter (Addendum)
[facility] TELEPHONE TRIAGE     Reason for call: Delayed encounter. [name] requested to relay the patient no longer has a fever. [name] states the patients lungs sound clear. [name] inquired if patient could discontinue antibiotics. [name] inquired if patient is to complete chest x-ray and sputum culture. [name] requests antifungal is prescribed for yeast rash on patients skin. [name] requested to relay vent PEEP of 7. [name] requests tobramycin is placed on a 28 day rotation.     Name of caller: [name] for Angel Wells  Relationship to patient: PDN  Phone:  [phone]

## 2021-06-07 NOTE — Telephone Encounter (Signed)
Duplicate encounter. Documentation updated to confirm delayed input of encounter. No further patient needs.

## 2021-06-08 ENCOUNTER — Ambulatory Visit: Payer: Medicare Other | Admitting: Optometry

## 2021-06-08 ENCOUNTER — Other Ambulatory Visit: Payer: Self-pay | Admitting: Oncology

## 2021-06-08 ENCOUNTER — Telehealth: Payer: Self-pay | Admitting: Oncology

## 2021-06-08 ENCOUNTER — Encounter: Payer: Self-pay | Admitting: Oncology

## 2021-06-08 ENCOUNTER — Encounter: Payer: Self-pay | Admitting: Gastroenterology

## 2021-06-08 DIAGNOSIS — E871 Hypo-osmolality and hyponatremia: Secondary | ICD-10-CM

## 2021-06-08 DIAGNOSIS — J4540 Moderate persistent asthma, uncomplicated: Secondary | ICD-10-CM

## 2021-06-08 MED ORDER — BUDESONIDE 0.25 MG/2ML IN SUSP *I*
0.2500 mg | Freq: Two times a day (BID) | RESPIRATORY_TRACT | 1 refills | Status: DC
Start: 2021-06-08 — End: 2021-08-29

## 2021-06-08 MED ORDER — GENERIC DME *A*
0 refills | Status: DC
Start: 2021-06-08 — End: 2021-06-29

## 2021-06-08 MED ORDER — GENERIC DME *A*
0 refills | Status: DC
Start: 2021-06-08 — End: 2022-06-23

## 2021-06-08 NOTE — Progress Notes (Signed)
Incoming [facility]  Date Received: 06/08/2021    Type of paperwork (sender:form type): [facility] Physician Orders    Completed paperwork to be sent to:  Fax to AttnClaire Shown [phone]    Special Requests:     Placed in box for: [name]

## 2021-06-08 NOTE — Telephone Encounter (Signed)
[name], nurse from [facility], called about budesonide. She would like to continue three times daily budesonide instead of usual twice daily. I gave a verbal order to continue three times daily.

## 2021-06-08 NOTE — Telephone Encounter (Signed)
-----   Message from Abram Sander, PharmD sent at 06/08/2021 10:01 AM EST -----  No, none of the usual agents that cause hyponatremia. There are some case reports where baclofen is suspected of causing hyponatremia. Recommend urine sodium evaluation to determine type of hyponatremia.  ----- Message -----  From: Fransico Him, NP  Sent: 06/08/2021   9:18 AM EST  To: Abram Sander, PharmD    Hi! Any meds that would cause hyponatremia?

## 2021-06-08 NOTE — Telephone Encounter (Signed)
Please call and let [name] RN/dad know he has moderate hyponatremia.  Please inquire if he is symptomatic - if symptomatic or becomes symptomatic will need to call on call or go to ED  Please inquire about amount of free water being given.    We will need a repeat BMP and urine labs to assess cause.  Meds do not seem to be an issue- some reports of baclofen calling this   May need to restart sodium flushes if free water amount is not too much (Included RD for assistance) but this should not occur until after repeat labs.    Thanks,  Marchelle Folks NP

## 2021-06-08 NOTE — Progress Notes (Signed)
Spoke to [place] and she is anxious to get [name] back to 5 of PEEP. My recommendation is to leave him on 7 until he has cleared this pneumonia and RSV infection. She is agreeable and will let me know when [name] is back to baseline so we can send an order to [facility] to go to the home and decrease to original settings.

## 2021-06-08 NOTE — Telephone Encounter (Signed)
Others' Prescriptions  Patient Name: Donie Lemelin   Birth Date: [birth year]   Address: [address] [place], [place] [address]   Sex: Male   Rx Written Rx Dispensed Drug Quantity Days Supply Prescriber Name Prescriber Dea # Payment Method Dispenser   06/06/2021 06/06/2021 lacosamide 10 mg/ml solution  200 10 Azucena Kuba (MD) [id number] [facility]   11/29/2020 05/26/2021 lacosamide 10 mg/ml solution  200 10 Azucena Kuba (MD) [id number] [facility]   11/29/2020 05/18/2021 lacosamide 10 mg/ml solution  200 10 Azucena Kuba (MD) [id number] [facility]   11/29/2020 05/04/2021 lacosamide 10 mg/ml solution  200 10 Azucena Kuba (MD) [id number] [facility]   11/29/2020 04/20/2021 lacosamide 10 mg/ml solution  200 10 Azucena Kuba (MD) [id number] [facility]   11/29/2020 04/08/2021 lacosamide 10 mg/ml solution  200 10 Azucena Kuba (MD) [id number] [facility]   11/29/2020 03/31/2021 lacosamide 10 mg/ml solution  200 10 Azucena Kuba (MD) [id number] [facility]   03/30/2021 03/31/2021 clonazepam 1 mg tablet  10 5 Palma Holter, [place] FNP-Bc [id number] [facility]   11/29/2020 03/18/2021 lacosamide 10 mg/ml solution  200 10 Azucena Kuba (MD) [id number] [facility]   11/29/2020 01/25/2021 vimpat 10 mg/ml solution  600 30 Azucena Kuba (MD) [id number] [facility]   11/29/2020 12/31/2020 vimpat 10 mg/ml solution  600 30 Azucena Kuba (MD) [id number] [facility]   11/29/2020 12/01/2020 vimpat 10 mg/ml solution  600 30 Azucena Kuba (MD) [id number] [facility]   05/14/2020 11/02/2020 vimpat 10 mg/ml solution  600 30 Azucena Kuba (MD) [id number] [facility]   05/14/2020 10/06/2020 vimpat 10 mg/ml solution  600 30 Azucena Kuba (MD) [id number] [facility]   05/14/2020  09/06/2020 vimpat 10 mg/ml solution  600 30 Azucena Kuba (MD) [id number] [facility]   05/14/2020 08/09/2020 vimpat 10 mg/ml solution  600 30 Azucena Kuba (MD) [id number] [facility]   05/14/2020 07/13/2020 vimpat 10 mg/ml solution  600 30 Azucena Kuba (MD) [id number] [facility]   05/14/2020 06/16/2020 vimpat 10 mg/ml solution  600 30 Azucena Kuba (MD) [id number] [facility]     * - Drugs marked with an asterisk are compound drugs. If the compound drug is made up of more than one controlled substance, then each controlled substance will be a separate row in the table.

## 2021-06-09 ENCOUNTER — Telehealth: Payer: Self-pay | Admitting: Oncology

## 2021-06-09 NOTE — Progress Notes (Signed)
Paperwork completed 06/09/2021  Sent as outlined in prior documentation

## 2021-06-09 NOTE — Telephone Encounter (Signed)
This Clinical research associate called Patient's RN [name], Discussed recent low serum Na of 125, she states he has had a reoccurring low sodium, especially when he is not feeling well. We reviewed water flushes, per last visit with RD here his fluid goal is ~2055mL per day, she states per I/O documentation the average amount he is getting is ~1817mL-2300mL in 24 hours. Has had the same amount of fluid intake for a long time period with no changes and has had some normal serum sodium values within the last year while still being on this same amount of fluid.     Discussed with her possible treatment for hyponatremia of limiting fluids.Goal would be to start by limiting to per day, per RN [name] she does not feel this would work everyday for him as he frequently needs extra water for mucus secretions that get very thick and hard to clear, and the only thing that helps is hydration. States they do give him NaCl with his water when he is sick, states recently gave normal saline when he wasn't feeling well and he seems to do well after these are given. She would like to further discuss sodium supplement with Marchelle Folks at next appointment. They have an appointment with her on 12/28. She is coming to the clinic on Monday 12/5 to get a wheelchair weight and she states she will leave a copy of his I/O documentation.     Did ask if she has noticed any new symptoms with this recent lab value. She states around the time this lab was taken he was not feeling well, he was fatigue and not talking as much as normal. She states they then gave the normal saline and he seemed to perk up and return to his normal self, no signs of nausea/vomiting or any other symptoms out of his baseline. Did tell her to call and let on-call here know or seek attention elsewhere if any new or concerning symptoms do come up.

## 2021-06-09 NOTE — Telephone Encounter (Signed)
Incoming call from [place], PDN for patient.  [name] asked to schedule an appt w Social Work as patient is getting denials for DME and routine supplies and would like support in this matter  Cb# 1 [phone]    Routed to [facility] CM

## 2021-06-10 ENCOUNTER — Encounter: Payer: Self-pay | Admitting: Oncology

## 2021-06-10 ENCOUNTER — Other Ambulatory Visit: Payer: Self-pay | Admitting: Pediatrics

## 2021-06-10 DIAGNOSIS — E1165 Type 2 diabetes mellitus with hyperglycemia: Secondary | ICD-10-CM

## 2021-06-10 LAB — LEGIONELLA CULTURE: Legionella Culture: 0

## 2021-06-10 NOTE — Progress Notes (Signed)
Incoming [facility]  Date Received: 06/10/2021    Type of paperwork:    Sender/organization: [facility] [employer]   Form type: trach supplies order form     Any specific/special request? pcp sig    Copy and scan before mailing? No    Completed paperwork to be sent to:  Fax to [phone]    Placed in box for: Angel Wells  10:21 AM  06/10/2021

## 2021-06-10 NOTE — Telephone Encounter (Signed)
Spoke with Angel Wells will meet with her on Monday 12/5 to assist.

## 2021-06-13 NOTE — Telephone Encounter (Signed)
Spoke with Angel Wells this day, she expressed gratitude that BMP and urine are being collected. She says that she will collect and bring samples to lab tomorrow.

## 2021-06-14 ENCOUNTER — Other Ambulatory Visit: Payer: Self-pay

## 2021-06-14 ENCOUNTER — Other Ambulatory Visit: Payer: Self-pay | Admitting: Pediatrics

## 2021-06-14 ENCOUNTER — Other Ambulatory Visit
Admission: RE | Admit: 2021-06-14 | Discharge: 2021-06-14 | Disposition: A | Discharge status: Home / Self Care | Payer: Medicare Other | Source: Ambulatory Visit | Attending: Primary Care | Admitting: Primary Care

## 2021-06-14 DIAGNOSIS — J4540 Moderate persistent asthma, uncomplicated: Secondary | ICD-10-CM

## 2021-06-14 DIAGNOSIS — E871 Hypo-osmolality and hyponatremia: Secondary | ICD-10-CM | POA: Insufficient documentation

## 2021-06-14 LAB — BASIC METABOLIC PANEL
Anion Gap: 14 (ref 7–16)
CO2: 25 mmol/L (ref 20–28)
Calcium: 9 mg/dL (ref 9.0–10.3)
Chloride: 93 mmol/L — ABNORMAL LOW (ref 96–108)
Creatinine: 0.31 mg/dL — ABNORMAL LOW (ref 0.67–1.17)
Glucose: 91 mg/dL (ref 60–99)
Lab: 8 mg/dL (ref 6–20)
Potassium: 4.4 mmol/L (ref 3.3–5.1)
Sodium: 132 mmol/L — ABNORMAL LOW (ref 133–145)
eGFR BY CREAT: 163 *

## 2021-06-14 LAB — OSMOLALITY: Osmolality: 270 mOsm/Kg H2O — ABNORMAL LOW (ref 278–297)

## 2021-06-14 LAB — SODIUM, URINE: Sodium,UR: 58 mmol/L

## 2021-06-15 ENCOUNTER — Encounter: Payer: Self-pay | Admitting: Gastroenterology

## 2021-06-15 ENCOUNTER — Other Ambulatory Visit: Payer: Self-pay | Admitting: Oncology

## 2021-06-15 DIAGNOSIS — E1165 Type 2 diabetes mellitus with hyperglycemia: Secondary | ICD-10-CM

## 2021-06-15 MED ORDER — ACCU-CHEK FASTCLIX LANCETS MISC
5 refills | Status: DC
Start: 2021-06-15 — End: 2021-07-20

## 2021-06-15 NOTE — Progress Notes (Signed)
Paperwork completed 08/05/2020  Sent as outlined in prior documentation

## 2021-06-16 ENCOUNTER — Encounter: Payer: Self-pay | Admitting: Oncology

## 2021-06-16 NOTE — Progress Notes (Signed)
Incoming paperwork for [facility]  Date Received: 06/16/2021    Type of paperwork (sender:form type): [facility] Physician Order    Completed paperwork to be sent to:  Fax to [phone]    Special Requests:     Placed in box for: [name]

## 2021-06-17 ENCOUNTER — Encounter: Payer: Self-pay | Admitting: Gastroenterology

## 2021-06-20 ENCOUNTER — Encounter: Payer: Self-pay | Admitting: Oncology

## 2021-06-20 NOTE — Progress Notes (Signed)
Incoming paperwork for [facility] to [facility]  Date Received: 06/20/2021    Type of paperwork:    Sender/organization: [employer]   Form type: physician orders 11/15-12/6    Any specific/special request? pcp sig    Copy and scan before mailing? No    Completed paperwork to be sent to:  Fax to [phone]    Placed in box for: Angel Wells  1:51 PM  06/20/2021

## 2021-06-21 NOTE — Progress Notes (Signed)
Outgoing paperwork:    Completed paperwork received on  06/21/2021  Sent to scanning? Yes  Sent as outlined in prior documentation: Fax to [phone]    Winferd Humphrey  8:58 AM  06/21/2021

## 2021-06-22 ENCOUNTER — Encounter: Payer: Self-pay | Admitting: Gastroenterology

## 2021-06-22 NOTE — Progress Notes (Signed)
Outgoing paperwork:    Completed paperwork received on  06/22/2021  Sent to scanning? Yes  Sent as outlined in prior documentation: Fax to [phone]    Winferd Humphrey  10:02 AM  06/22/2021

## 2021-06-23 ENCOUNTER — Encounter: Payer: Self-pay | Admitting: Oncology

## 2021-06-23 ENCOUNTER — Telehealth: Payer: Self-pay | Admitting: Oncology

## 2021-06-23 DIAGNOSIS — S069XAA Unspecified intracranial injury with loss of consciousness status unknown, initial encounter: Secondary | ICD-10-CM

## 2021-06-23 DIAGNOSIS — G8250 Quadriplegia, unspecified: Secondary | ICD-10-CM

## 2021-06-23 MED ORDER — GENERIC DME *A*
0 refills | Status: DC
Start: 2021-06-23 — End: 2023-10-10

## 2021-06-23 NOTE — Telephone Encounter (Signed)
[facility] TELEPHONE INTAKE    Reason for call: was talking to respiratory services & pt needs new matress, gel matress topper & new bed frame     Name of caller: [name]  Relationship to patient: RN   Phone:  [phone]

## 2021-06-23 NOTE — Telephone Encounter (Signed)
error 

## 2021-06-24 ENCOUNTER — Encounter: Payer: Self-pay | Admitting: Oncology

## 2021-06-24 MED ORDER — GENERIC DME *A*
0 refills | Status: DC
Start: 2021-06-24 — End: 2022-06-23

## 2021-06-24 NOTE — Telephone Encounter (Signed)
Angel Wells is in need of new hospital bed mattress because current mattress has been worn out.      Palma Holter, NP        Spoke to respiratory services [facility] - not eligible for gel topper.  Script for mattress sent today  Bed frame fixed yesterday.

## 2021-06-24 NOTE — Progress Notes (Signed)
Incoming paperwork for [facility]  Date Received: 06/24/2021    Type of paperwork (sender:form type): [employer] Missed Visits    Completed paperwork to be sent to:  Fax to [phone]    Special Requests:     Placed in box for: [name]

## 2021-06-29 ENCOUNTER — Encounter: Payer: Self-pay | Admitting: Gastroenterology

## 2021-06-29 ENCOUNTER — Telehealth: Payer: Self-pay | Admitting: Oncology

## 2021-06-29 DIAGNOSIS — J9600 Acute respiratory failure, unspecified whether with hypoxia or hypercapnia: Secondary | ICD-10-CM

## 2021-06-29 DIAGNOSIS — G8250 Quadriplegia, unspecified: Secondary | ICD-10-CM

## 2021-06-29 DIAGNOSIS — S069XAA Unspecified intracranial injury with loss of consciousness status unknown, initial encounter: Secondary | ICD-10-CM

## 2021-06-29 DIAGNOSIS — Z9911 Dependence on respirator [ventilator] status: Secondary | ICD-10-CM

## 2021-06-29 DIAGNOSIS — J9611 Chronic respiratory failure with hypoxia: Secondary | ICD-10-CM

## 2021-06-29 MED ORDER — GENERIC DME *A*
6 refills | Status: DC
Start: 2021-06-29 — End: 2023-10-10

## 2021-06-29 MED ORDER — NYSTATIN 100000 UNIT/ML MT SUSP *I*
500000.0000 [IU] | Freq: Four times a day (QID) | OROMUCOSAL | 0 refills | Status: DC
Start: 2021-06-29 — End: 2022-06-23

## 2021-06-29 NOTE — Telephone Encounter (Signed)
Faxed Gel Over Angel Wells to [employer]

## 2021-06-29 NOTE — Telephone Encounter (Signed)
Spoke to [place], Charity fundraiser.  Aware of nystatin orders.  Will call if symptoms do not improve.    Currently change Angel Wells's trach twice a month.  Will clean it, boil it and then freeze it to home sterilize.  Currently ordered for once a month changes but has always needed twice.  Requested order for 2 trach/month to [facility].    Fax#:  [phone]  Attn: Annetta

## 2021-06-29 NOTE — Telephone Encounter (Signed)
[facility] TELEPHONE INTAKE    Reason for call: Pts RN states thinks pt has thrush. Noticed that the pts tongue was white yesterday and that the pt has been on antibiotics. [name] is requesting a nystatin script. Writer pended script. [name] is also requesting 2 tracheostomies a month. Please advise    Name of caller: [name]  Relationship to patient: RN  Organization (if applicable):   Phone:  [phone]  Fax (if applicable):

## 2021-06-30 NOTE — Telephone Encounter (Addendum)
Faxed trach script to [phone]

## 2021-06-30 NOTE — Progress Notes (Signed)
Paperwork completed 06/30/2021  Sent as outlined in prior documentation.

## 2021-07-04 ENCOUNTER — Other Ambulatory Visit: Payer: Self-pay | Admitting: Oncology

## 2021-07-05 ENCOUNTER — Encounter: Payer: Self-pay | Admitting: Oncology

## 2021-07-05 NOTE — Progress Notes (Signed)
Incoming paperwork for [facility]  Date Received: 07/05/2021    Type of paperwork (sender:form type): [facility] Certification and Plan of Care 06/30/21-08/28/21    Completed paperwork to be sent to:  Fax to [phone]    Special Requests:     Placed in box for: [name]

## 2021-07-05 NOTE — Progress Notes (Signed)
Incoming paperwork for completion to [facility]  Date Received: 07/05/2021    Type of paperwork (sender:form type): [facility] Telephone Orders Nystatin and Bivona    Completed paperwork to be sent to:  Fax to [phone]    Special Requests:     Placed in box for: [name]

## 2021-07-06 ENCOUNTER — Encounter: Payer: Self-pay | Admitting: Gastroenterology

## 2021-07-06 ENCOUNTER — Ambulatory Visit: Payer: Medicare Other | Attending: Oncology | Admitting: Oncology

## 2021-07-06 ENCOUNTER — Encounter: Payer: Self-pay | Admitting: Oncology

## 2021-07-06 ENCOUNTER — Other Ambulatory Visit: Payer: Self-pay

## 2021-07-06 VITALS — BP 118/66 | HR 87 | Temp 96.3°F | Wt 180.6 lb

## 2021-07-06 DIAGNOSIS — E1165 Type 2 diabetes mellitus with hyperglycemia: Secondary | ICD-10-CM | POA: Insufficient documentation

## 2021-07-06 DIAGNOSIS — B370 Candidal stomatitis: Secondary | ICD-10-CM | POA: Insufficient documentation

## 2021-07-06 DIAGNOSIS — S069XAA Unspecified intracranial injury with loss of consciousness status unknown, initial encounter: Secondary | ICD-10-CM | POA: Insufficient documentation

## 2021-07-06 DIAGNOSIS — Z794 Long term (current) use of insulin: Secondary | ICD-10-CM | POA: Insufficient documentation

## 2021-07-06 DIAGNOSIS — Z23 Encounter for immunization: Secondary | ICD-10-CM | POA: Insufficient documentation

## 2021-07-06 DIAGNOSIS — E871 Hypo-osmolality and hyponatremia: Secondary | ICD-10-CM | POA: Insufficient documentation

## 2021-07-06 NOTE — Progress Notes (Signed)
Paperwork completed 07/06/2021  Sent as outlined in prior documentation

## 2021-07-06 NOTE — Patient Instructions (Addendum)
Discontinue nystatin    Return mucinex to BID    Get labs- BMP and hemoglobin A1C :)      Let me know if you need letters/paperwork to help with supplies      Happy New Year!

## 2021-07-06 NOTE — Progress Notes (Addendum)
Clinical Pharmacy Services_        Angel Wells is a 30 y.o. male referred for Pharmacotherapy Consult + Comprehensive Medication Management. Patient was referred by PCP for Medication Complexity/Polypharmacy. Patient's medications and allergies were updated and marked as reviewed, as appropriate.       Assessment  With regards to medication management for this patient, the following aspects were reviewed: Indication, effectiveness, safety and convenience of each medication. The patient's medical conditions and medications were assessed, evaluated, and deemed meeting goals of drug therapy except as listed below:    Recommendations/Plan  1) Type 2 Diabetes   BG's from 12/19-12/28  Current diabetes regimen: Lispro 10u TID w/ meals+ Toujeo 110u nightly + Metformin 1047m BID   Time: BG mg/dl:   Breakfast 0900  1030  0900  0935  0950  0930  1000  0900  1000  0915  0930 79  83  110  105  127  124  124  125  136  149  133   Lunch 1230  1430  1230  1200  1300  1315  1330  1400  1200  1130 94  130  133  155  127  189  170  124  162  145   Dinner 1700  1700  1730  1700  1730  1830  1730  1815  1800 113  116  105  116  105  158  155  121  112     Has been doing well, BG's are improved with the increase in metformin that happened in September along with dietary modifications. Will evaluate new A1c before making any further changes to see how the increased metformin is impacting the level.    Addendum: A1c has decreased to 5.4%, Toujeo reduced to 90 units nightly.  ---------------------------------------------------------------------------------------------------------------  This report was sent to ASherrie Sport NP for review and final approval. Patient/Caregiver was able to verbalize understanding and repeat back key educational points.    Thank you for allowing me to participate in the care of this patient. Please contact me with any questions.    MElvera Maria PharmD., [place] [address] [place] [address] [phone]   Current Medication Regimen   Current Outpatient Medications   Medication Sig Dispense Refill    QC MEDIFIN 400 400 MG TABS give 1 tablet by mouth every 4 hours as needed for expectorant (Patient not taking: Reported on 07/06/2021) 120 tablet 0    nystatin (MYCOSTATIN) 100,000 Units/mL suspension Swish and swallow 5 mLs (500,000 units total) 4 times daily  for Candidiasis Fungal Infection of the Oropharynx Swish and hold in mouth before swallowing. 473 mL 0    generic DME Item to dispense: 2 Tracheostomies #7 Bivona Silicone Cuffed 80 mm Length  Instructions for use: use as directed 2 each 6    generic DME Item to dispense: hospital bed mattress Instructions for use: use as directed 1 each 0    generic DME Gel Overlay Mattress.  LON: 99, [id number] Use as directed. G82.50, S06.9x9 1 each 0    ipratropium (ATROVENT) 0.02 % nebulizer solution INHALE 2.5 MLS (500 MCG TOTAL) BY NEBULIZATION 4 TIMES DAILY AS NEEDED FOR WHEEZING J45.4 62.5 mL 5    Accu-Chek FastClix Lancets MISC 1 EACH BY MISC.(NON-DRUG COMBO ROUTE) ROUTE 3 (THREE) TIMES DAILY. E11.65 102 each 5    insulin aspart (NOVOLOG FLEXPEN) 100 UNIT/ML injection  pen for type 2 diabetes inject SUB-CUTANEOUSLY before meals: nutritional bl: 10 units + ONE HALF (1/2) as follows: <70: hold bl, 70-200: bl, > 200: bl+2, >250: bl+4, >300: bl+6, >350: bl +8. alert md for bg <70 and >350. 45 each 0    generic DME Item to dispense: Large Volume Nebulizer Pump 50 psi  Instructions for use: use as directed 1 each 0    budesonide (PULMICORT) 0.25 mg/73m nebulizer suspension Inhale 2 mLs (0.25 mg total) by nebulization 2 times daily 360 mL 1    B-D UF III MINI PEN NEEDLES 31G X 5 MM USE 3 (THREE) TIMES DAILY AS NEEDED. 100 each 5    lacosamide (VIMPAT) 10 mg/mL SOLN oral solution Take 10 MILLILITER per tube twice daily. ; MAXIMUM DAILY DOSE 20 MILLILITER 600 mL 5     generic DME Non-sterile gloves size medium Use as directed. MCAID ID [id number]/month 3 each 5    nystatin (MYCOSTATIN) 100000 UNIT/GM cream Apply topically 2 times daily  to the following areas: buttocks 15 g 11    levETIRAcetam (KEPPRA) 100 mg/mL solution give 15 MILLILITER by g-tube twice daily 2700 mL 0    metFORMIN (GLUCOPHAGE) 500 MG/5ML solution Take 10 mLs (1,000 mg total) by mouth 2 times daily (with meals)  for Type 2 Diabetes 473 mL 5    sodium chloride 0.9% 0.9 % irrigation Irrigate with 250 mLs as directed 3 times daily 500 mL 0    syringe disposable (BD LUER-LOK) 10 ML Use as instructed for inflation of tracheostomy balloon. 5 each 11    famotidine (PEPCID) 20 mg tablet take 1 tablet by mouth 2 (two) times daily. ok to crush it and put it down tube if necessary 180 tablet 6    chlorhexidine (PERIDEX) 0.12 % solution use as directed 473 mL 11    TOBI 300 MG/5ML nebulizer solution Inhale 5 mLs (300 mg total) by nebulization every 12 hours  for pseudomonas infection, trach/vented patient NEBULIZE 1 VIAL (300 MG) TWICE DAILY EVERY OTHER MONTH   DIAGNOSIS: E84.0 (Patient not taking: Reported on 07/06/2021) 280 mL 5    Nebulizer MISC Disposable nebulizer kits to be used inline with trach  MCAID ID [id number] each 5    baclofen (LIORESAL) 10 mg tablet take 1 tablet (10 milligram total) by mouth 2 times daily 180 tablet 0    Insulin Glargine, 2 Unit Dial, (TOUJEO MAX SOLOSTAR) 300 UNIT/ML injection pen Inject 110 units into the skin nightly  for Type 2 Diabetes 1 each 3    LACTULOSE 10 GM/15ML solution administer 30 mls (20 g total) via g tube daily as needed (no stool in 48 hours) use as directed 946 mL 5    fluticasone (FLONASE) 50 MCG/ACT nasal spray spray 1 spray into nostril daily 16 g 5    clonazePAM (KLONOPIN) 1 mg tablet TAKE 1 TABLET BY MOUTH TWICE DAILY AS NEEDED (crush for g-tube) 10 tablet 0    bisacodyl (DULCOLAX) 10 mg suppository Use one suppository as needed for no BM after  48 hours 12 each 5    oxygen 1-2 L/min to keep O2 sat above 92%, may increase up to 4 lpm as needed.  Low saturation alarm 85%, high alarm off, low HR 50, High HR 200 1 each 0    albuterol (PROVENTIL) (2.5 mg/366m 0.083% nebulizer solution Inhale 3 mLs (2.5 mg total) by nebulization 3 times daily  for Spasm of Lung Air Passages with vesting.  When ill,  use four times a day with vesting. 1050 mL 3    SENNA 8.6 MG tablet administer 2 tablets via g tube daily 180 tablet 0    generic DME Item to dispense: Stationary Continuous Oximeter  Instructions for use: Use at night time Dx: Z99.11 1 each 0    generic DME Cough assist machine repairs for: [employer] respironics cough assist model T70. 1 each 0    valproate (VALPROIC ACID) 50 mg/mL syrup take 5 mls (250 milligram total) by mouth 3 times daily 1350 mL 0    ibuprofen (ADVIL,MOTRIN) 600 mg tablet Take 1 tablet (600 mg total) by mouth 4 times daily as needed for Pain for up to 30 doses 30 tablet 5    generic DME Repair, parts, and labor for coughing assist machine. MCAID CIN: [id number] 1 each 0    blood glucose (ACCU-CHEK AVIVA PLUS) test strip USE 1 STRIP 3 (THREE) TIMES DAILY AND AS NEEDED.DX: E11.65 AND Z79.4 100 each 5    cetirizine (ZYRTEC) 10 mg tablet Take 1 tablet (10 mg total) by mouth nightly 90 tablet 5    Misc. Devices (PULSE OXIMETER) MISC Pulse Oximeter with Supplies, LON: 99, [id number], J96.11, Z99.11 1 each 0    Syringe Disposable 60 ML MISC Use as directed 5 each 5    generic DME Adhesive tape 1" x 5 yards. Use as directed. Disp 3 box 3 each 5    generic DME Suction canister Use as directed.MCAID ID [id number] 4 each 5    generic DME Suction tubing Use as directed.MCAID ID [id number] 4 each 5    suction machine Instructions for use: Use as needed for secretions, LON: 99 months, [id number] 1 each 0    generic DME Pulse ox probe replacement tape. Use as directed.[id number], LON 99 15 each 5    generic DME Pulse ox sensors (disposable)Use as  directed.MCAID ID [id number], LON 99 4 each 5    docusate sodium (DOCUSATE SODIUM) 10 mg/mL liquid Take 10 mLs (100 mg total) by mouth 2 times daily (Patient not taking: Reported on 03/30/2021) 600 mL 5    generic DME [place] Percussion Vest Model # 105  55inch  Use as directed. 1 each 0    GLUCAGON EMERGENCY 1 MG injection kit Use as directed AS NEEDED for blood glucose < 70 1 each 1    calcium carbonate 650 mg tablet Take 650 mg by mouth daily  Via G Tube      Cholecalciferol 25 MCG (1000 UT) CHEW Take 1,000 units by mouth daily  2 chews daily      Non-System Medication Ok to change Gtube at home by nursing staff every 3 months and PRN 1 each 0    MIC-KEY gastrostomy kit 14FR Please dispense every 3 months and prn, MicKey 14Fr 2.5cm 1 kit 3    EPINEPHrine (EPIPEN) 0.3 mg/0.3 mL auto-injector USE AS DIRECTED 2 each 0    carbamide peroxide (DEBROX) 6.5 % otic solution Use 5 drops twice daily on the first 3 days of each month. 15 mL 5    lactobacillus rhamnosus, GG, (CULTURELLE) capsule Take 1 capsule (1 each total) by mouth daily 30 capsule 5    nystatin (NYSTATIN) powder Apply topically 4 times daily  to the following areas: neck, groin, axilla 45 g 5    sodium chloride 0.9 % nebulizer solution Inhale 3 mLs by nebulization as needed for Wheezing  MCAID ID [id number] 90 mL 5    triamcinolone (KENALOG) 0.025 %  cream Apply topically 2 times daily  APPLY TO AFFECTED AREA (Patient not taking: No sig reported) 15 g 2    zinc oxide (DESITIN) 40 % paste Apply topically as needed for Diaper Rash 57 g 1    generic DME TRACH TIES WITH O2 PORT. Use as directed.MCAID ID [id number] 30 each 11    MIC-KEY gastrostomy feeding tube extension set 12" Use as directed with gtube 1 kit 11    generic DME Adult size trach ties Use as directed. 30 each 5    generic DME [employer] for use in wheelchair, dispense 1. Use as directed. Dx G80.0, S06.9x9, LON: lifetime 1 each 0    MIC-KEY gastrostomy feeding tube extension  set 24" Use as directed, please dispense 4 per month 4 kit 5    gauze pads 2"X2" Use as directed around trach. MCAID ID [id number] 100 each 5    Nebulizers (PARI LC PLUS VIOS PRO NEB) MISC Pari Vios Pro Neb or comparable with Nebulizer kits and nebulizer trach mask. Use twice daily With albuterol.MCAID # [id number], MCARE [id number] 1 each 0    generic DME 50m Adapter for trach to be used as directed. Replace weekly. Dx.Z99.11 4 each 5    generic DME 15 mm adapters for nebulized therapies via trach  Medicare ID [id number] Medicaid ID [id number] each 11    nebulizer device with mask and tubing Use as directed 1 kit 0    generic DME Please dispense one 539msyringe. Use as directed. (Patient not taking: Reported on 03/30/2021) 1 each 5    generic DME MCAID ID [id number] CUFFED TRACH 7.0 Please change trach every month and as needed. 1 each 5    generic DME Please dispense 3062measuring cup. Use as directed. 1 each 5    Gauze Pads & Dressings (SPLIT GAUZE DRAINAGE SPONGE) 4"X4" Use once and PRN times a day as instructed. 30 each 5    generic DME 10 isothermal adult "antiflect" connector. MCAID [id number] as directed. 10 each 5    generic DME Suction catheter with chimney valve.Use as directed.MCAID ID [id number] 0    gauze sponge (CURITY) 4"X4" pads Use PRN as instructed.MCAID ID [id number] each 5    generic DME PASSEY MUIR VALVE (PURPLE) Use as tolerated.MCAID ID [id number] 3    Adhesive Tape 1"x5yd TAPE By 1 Units no specified route as needed MCAID ID [id number] 5    generic DME 10 ml syringes Use as directed.MCAID ID [id number] 5    generic DME Trach care kits Use as directed.MCAID ID [id number] each 5    surgical lubricant (SURGILUBE) gel Apply topically as needed MCAID ID [id number] g 5    Distilled Water LIQD Distilled water, use as directedMCAID ID [id number] Bottle 5    generic DME Mepilex 4X 4. Use as directed.MCAID ID [id number] 5    generic DME HME  for trach humidification Use as directed.MCAID ID [id number] 09ch 5    generic DME Cough Assist circuits Use as directed.MCAID ID [id number] 5    generic DME Suction filter Use as directed.MCAID ID [id number] 5    generic DME Nasal adapters Use as directed.MCAID ID [id number] 5    diphenhydrAMINE (BENADRYL) 25 MG oral solid 25 mg by Per G Tube route nightly as needed for Sleep (Patient not taking: Reported on 03/30/2021)      FIBER  SELECT GUMMIES PO Take by mouth Give 2 gummies daily      Multiple Vitamins-Minerals (HM MULTIVITAMIN ADULT GUMMY PO) Take by mouth Give 2 gummies daily      Ascorbic Acid (VITAMIN C ADULT GUMMIES PO) Take 250 mg by mouth daily  Give 2 gummies once daily       cetirizine (ZYRTEC) 1 MG/ML syrup 10 mg by Per G Tube route at bedtime      oxygen 2 l/m bleed through ventilator at night 1 each 0    Blood Glucose Monitoring Suppl (FIFTY50 GLUCOSE METER 2.0) w/Device KIT by Other route      miconazole (MICATIN) 2 % powder 2 times daily as needed       insulin pen needle (FIFTY50 PEN NEEDLES) 31G X 8 MM 1 each       No current facility-administered medications for this visit.

## 2021-07-06 NOTE — Progress Notes (Signed)
[facility]    Office Visit Note     REASON FOR VISIT      Chief Complaint   Patient presents with   . Follow-up         PRIMARY DIAGNOSIS      TBI    Subjective     SUBJECTIVE      Angel Wells was accompanied by caregiver who provided all of subjective history         Thrush:  -Wondering if nystatin can be discontinued  -Seemingly resolved     Hyponatremia:  -No longer administering saline through gtube  -usually only happens when he is ill  -no seizure activity    DM:  -DM eye exam in August  -Doing well with current medication regimen      BP: systolic 118-134  Diastolic: 70-80s    ROS  Review of Systems as per HPI above    Past Medical History, Social History, Family History, and Medications/allergies reviewed during this visit    Current Outpatient Medications   Medication   . B-D UF III MINI PEN NEEDLES 31G X 5 MM   . lacosamide (VIMPAT) 10 mg/mL SOLN oral solution   . nystatin (MYCOSTATIN) 100000 UNIT/GM cream   . levETIRAcetam (KEPPRA) 100 mg/mL solution   . sodium chloride 0.9% 0.9 % irrigation   . syringe disposable (BD LUER-LOK) 10 ML   . famotidine (PEPCID) 20 mg tablet   . Nebulizer MISC   . baclofen (LIORESAL) 10 mg tablet   . fluticasone (FLONASE) 50 MCG/ACT nasal spray   . bisacodyl (DULCOLAX) 10 mg suppository   . oxygen   . albuterol (PROVENTIL) (2.5 mg/19mL) 0.083% nebulizer solution   . SENNA 8.6 MG tablet   . valproate (VALPROIC ACID) 50 mg/mL syrup   . blood glucose (ACCU-CHEK AVIVA PLUS) test strip   . cetirizine (ZYRTEC) 10 mg tablet   . Misc. Devices (PULSE OXIMETER) MISC   . Syringe Disposable 60 ML MISC   . suction machine   . calcium carbonate 650 mg tablet   . Non-System Medication   . lactobacillus rhamnosus, GG, (CULTURELLE) capsule   . nystatin (NYSTATIN) powder   . sodium chloride 0.9 % nebulizer solution   . Nebulizers (PARI LC PLUS VIOS PRO NEB) MISC   . nebulizer device with mask and tubing   . Adhesive Tape 1"x5yd TAPE   . surgical lubricant (SURGILUBE) gel   .  FIBER SELECT GUMMIES PO   . Multiple Vitamins-Minerals (HM MULTIVITAMIN ADULT GUMMY PO)   . oxygen   . Blood Glucose Monitoring Suppl (FIFTY50 GLUCOSE METER 2.0) w/Device KIT   . Insulin Glargine, 2 Unit Dial, (TOUJEO MAX SOLOSTAR) 300 UNIT/ML injection pen   . nystatin (MYCOSTATIN) 100,000 Units/mL suspension   . generic DME   . generic DME   . generic DME   . ipratropium (ATROVENT) 0.02 % nebulizer solution   . Accu-Chek FastClix Lancets MISC   . insulin aspart (NOVOLOG FLEXPEN) 100 UNIT/ML injection pen   . generic DME   . budesonide (PULMICORT) 0.25 mg/65mL nebulizer suspension   . generic DME   . metFORMIN (GLUCOPHAGE) 500 MG/5ML solution   . chlorhexidine (PERIDEX) 0.12 % solution   . TOBI 300 MG/5ML nebulizer solution   . LACTULOSE 10 GM/15ML solution   . clonazePAM (KLONOPIN) 1 mg tablet   . generic DME   . generic DME   . ibuprofen (ADVIL,MOTRIN) 600 mg tablet   . generic DME   .  generic DME   . generic DME   . generic DME   . generic DME   . generic DME   . docusate sodium (DOCUSATE SODIUM) 10 mg/mL liquid   . generic DME   . GLUCAGON EMERGENCY 1 MG injection kit   . Cholecalciferol 25 MCG (1000 UT) CHEW   . MIC-KEY gastrostomy kit 14FR   . EPINEPHrine (EPIPEN) 0.3 mg/0.3 mL auto-injector   . carbamide peroxide (DEBROX) 6.5 % otic solution   . triamcinolone (KENALOG) 0.025 % cream   . zinc oxide (DESITIN) 40 % paste   . generic DME   . MIC-KEY gastrostomy feeding tube extension set 12"   . generic DME   . generic DME   . MIC-KEY gastrostomy feeding tube extension set 24"   . gauze pads 2"X2"   . generic DME   . generic DME   . generic DME   . generic DME   . generic DME   . Gauze Pads & Dressings (SPLIT GAUZE DRAINAGE SPONGE) 4"X4"   . generic DME   . generic DME   . gauze sponge (CURITY) 4"X4" pads   . generic DME   . generic DME   . generic DME   . Distilled Water LIQD   . generic DME   . generic DME   . generic DME   . generic DME   . generic DME   . diphenhydrAMINE (BENADRYL) 25 MG oral solid   .  Ascorbic Acid (VITAMIN C ADULT GUMMIES PO)   . miconazole (MICATIN) 2 % powder   . insulin pen needle (FIFTY50 PEN NEEDLES) 31G X 8 MM     No current facility-administered medications for this visit.          Objective     OBJECTIVE      BP 118/66   Pulse 87   Temp 35.7 C (96.3 F) (Temporal)   Wt 81.9 kg (180 lb 9.6 oz)   SpO2 96%   BMI 30.05 kg/m     Physical Exam  Constitutional:       General: He is not in acute distress.     Appearance: He is not ill-appearing, toxic-appearing or diaphoretic.   HENT:      Mouth/Throat:      Mouth: Mucous membranes are moist.      Pharynx: Oropharynx is clear. No oropharyngeal exudate or posterior oropharyngeal erythema.      Comments: Trach in place along with neck brace for cervical support  Eyes:      General: No scleral icterus.        Right eye: No discharge.         Left eye: No discharge.      Extraocular Movements: Extraocular movements intact.      Conjunctiva/sclera: Conjunctivae normal.      Pupils: Pupils are equal, round, and reactive to light.   Cardiovascular:      Rate and Rhythm: Normal rate and regular rhythm.   Pulmonary:      Effort: Pulmonary effort is normal. No respiratory distress.      Breath sounds: Normal breath sounds.   Abdominal:      General: Bowel sounds are normal. There is no distension.      Palpations: Abdomen is soft.      Tenderness: There is no abdominal tenderness.   Skin:     General: Skin is warm and dry.      Capillary Refill: Capillary refill takes less than 2 seconds.  Coloration: Skin is not jaundiced or pale.   Neurological:      Mental Status: He is alert.               feet: normal DP and PT pulses, no trophic changes or ulcerative lesions, normal sensory exam and normal monofilament exam    ASSESSMENT / DIAGNOSIS         1. Type 2 diabetes mellitus with hyperglycemia, with long-term current use of insulin  Doing well - will repeat hemoglobin a1c and adjust plan based on results  - Hemoglobin A1c; Future  - Basic  metabolic panel; Future    2. TBI (traumatic brain injury)  Underlying condition, all treatment decisions made with this in consideration     - Basic metabolic panel; Future    3. Immunization due  Due for tdap today  - Tdap >/= 53yr(Boostrix)    4. Thrush  D/c nystatin    5. Hyponatremia  Repeat BMP  No seizure activity  Depakote and keppra have low likelihood of causing this          Orders Placed This Encounter   . Tdap >/= 53yr(Boostrix)   . Hemoglobin A1c   . Basic metabolic panel     Patient Instructions   Discontinue nystatin    Return mucinex to BID    Get labs- BMP and hemoglobin A1C :)      Let me know if you need letters/paperwork to help with supplies      Happy New Year!         --Patient instructed to call if symptoms are not improving or worsening  --Follow-up arranged  No follow-ups on file.     I personally spent 30 minutes on the calendar day of the encounter, including pre and post visit work reviewing the EMR and management of this patient.     Electronically signed by Palma Holter, NP , 07/08/2021 @   [facility], Phone: [phone]

## 2021-07-08 ENCOUNTER — Other Ambulatory Visit
Admission: RE | Admit: 2021-07-08 | Discharge: 2021-07-08 | Disposition: A | Discharge status: Home / Self Care | Payer: Medicare Other | Source: Ambulatory Visit | Attending: Oncology | Admitting: Oncology

## 2021-07-08 ENCOUNTER — Telehealth: Payer: Self-pay | Admitting: Oncology

## 2021-07-08 DIAGNOSIS — S069XAA Unspecified intracranial injury with loss of consciousness status unknown, initial encounter: Secondary | ICD-10-CM | POA: Insufficient documentation

## 2021-07-08 DIAGNOSIS — E871 Hypo-osmolality and hyponatremia: Secondary | ICD-10-CM

## 2021-07-08 DIAGNOSIS — E1165 Type 2 diabetes mellitus with hyperglycemia: Secondary | ICD-10-CM | POA: Insufficient documentation

## 2021-07-08 DIAGNOSIS — Z794 Long term (current) use of insulin: Secondary | ICD-10-CM

## 2021-07-08 LAB — BASIC METABOLIC PANEL
Anion Gap: 12 (ref 7–16)
CO2: 26 mmol/L (ref 20–28)
Calcium: 9.5 mg/dL (ref 9.0–10.3)
Chloride: 91 mmol/L — ABNORMAL LOW (ref 96–108)
Creatinine: 0.28 mg/dL — ABNORMAL LOW (ref 0.67–1.17)
Glucose: 99 mg/dL (ref 60–99)
Lab: 9 mg/dL (ref 6–20)
Potassium: 4.6 mmol/L (ref 3.3–5.1)
Sodium: 129 mmol/L — ABNORMAL LOW (ref 133–145)
eGFR BY CREAT: 168 *

## 2021-07-08 LAB — HEMOGLOBIN A1C: Hemoglobin A1C: 5.4 %

## 2021-07-08 MED ORDER — TOUJEO MAX SOLOSTAR 300 UNIT/ML SC SOPN
90.0000 [IU] | PEN_INJECTOR | Freq: Every evening | SUBCUTANEOUS | 3 refills | Status: DC
Start: 2021-07-08 — End: 2021-09-15

## 2021-07-08 NOTE — Addendum Note (Signed)
Addended by: Abram Sander on: 07/08/2021 02:19 PM     Modules accepted: Orders

## 2021-07-08 NOTE — Telephone Encounter (Signed)
Please let home RNs know Angel Wells is still hyponatremic even though he is well. (Usually this happens when ill which is why I would like to repeat again.   Repeat BMP with urine labs - they are ordered.    Decrease toujeo to 90 units nightly  Hgba1c 5.4!!!    Please also inquire about free water intake or any increase in urination.    Thank you  Palma Holter, NP

## 2021-07-11 ENCOUNTER — Encounter: Payer: Self-pay | Admitting: Gastroenterology

## 2021-07-11 ENCOUNTER — Other Ambulatory Visit: Payer: Self-pay | Admitting: Pediatrics

## 2021-07-12 ENCOUNTER — Other Ambulatory Visit: Payer: Self-pay | Admitting: Pediatrics

## 2021-07-12 ENCOUNTER — Encounter: Payer: Self-pay | Admitting: Oncology

## 2021-07-12 DIAGNOSIS — Z5321 Procedure and treatment not carried out due to patient leaving prior to being seen by health care provider: Secondary | ICD-10-CM

## 2021-07-12 DIAGNOSIS — R569 Unspecified convulsions: Secondary | ICD-10-CM

## 2021-07-12 NOTE — Telephone Encounter (Signed)
Spoke with [name] and notified of the below message from PCP. She says that she will collect lab orders this week. Says that she will also begin decreasing toujeo to 90 units this evening. Mentions that pt has often has hyponatremia denies any I +O changes.

## 2021-07-13 ENCOUNTER — Other Ambulatory Visit: Payer: Self-pay | Admitting: Neurology

## 2021-07-13 ENCOUNTER — Other Ambulatory Visit: Payer: Self-pay | Admitting: Oncology

## 2021-07-13 ENCOUNTER — Other Ambulatory Visit: Payer: Self-pay | Admitting: Pediatrics

## 2021-07-13 DIAGNOSIS — Z8701 Personal history of pneumonia (recurrent): Secondary | ICD-10-CM

## 2021-07-13 DIAGNOSIS — Z8669 Personal history of other diseases of the nervous system and sense organs: Secondary | ICD-10-CM

## 2021-07-13 DIAGNOSIS — G40219 Localization-related (focal) (partial) symptomatic epilepsy and epileptic syndromes with complex partial seizures, intractable, without status epilepticus: Secondary | ICD-10-CM

## 2021-07-13 MED ORDER — TOBI 300 MG/5ML IN NEBU
300.0000 mg | INHALATION_SOLUTION | Freq: Two times a day (BID) | RESPIRATORY_TRACT | 5 refills | Status: DC
Start: 2021-07-13 — End: 2022-05-18

## 2021-07-13 MED ORDER — LACOSAMIDE 10 MG/ML PO SOLN *I*
ORAL | 5 refills | Status: DC
Start: 2021-07-13 — End: 2022-01-12

## 2021-07-14 ENCOUNTER — Encounter: Payer: Self-pay | Admitting: Gastroenterology

## 2021-07-14 ENCOUNTER — Other Ambulatory Visit: Payer: Self-pay | Admitting: Oncology

## 2021-07-14 DIAGNOSIS — M2450 Contracture, unspecified joint: Secondary | ICD-10-CM

## 2021-07-15 ENCOUNTER — Other Ambulatory Visit
Admission: RE | Admit: 2021-07-15 | Discharge: 2021-07-15 | Disposition: A | Discharge status: Home / Self Care | Payer: Medicare Other | Source: Ambulatory Visit | Attending: Oncology | Admitting: Oncology

## 2021-07-15 DIAGNOSIS — E871 Hypo-osmolality and hyponatremia: Secondary | ICD-10-CM | POA: Insufficient documentation

## 2021-07-15 LAB — BASIC METABOLIC PANEL
Anion Gap: 14 (ref 7–16)
CO2: 25 mmol/L (ref 20–28)
Calcium: 9.6 mg/dL (ref 9.0–10.3)
Chloride: 95 mmol/L — ABNORMAL LOW (ref 96–108)
Creatinine: 0.32 mg/dL — ABNORMAL LOW (ref 0.67–1.17)
Glucose: 95 mg/dL (ref 60–99)
Lab: 9 mg/dL (ref 6–20)
Potassium: 4.4 mmol/L (ref 3.3–5.1)
Sodium: 134 mmol/L (ref 133–145)
eGFR BY CREAT: 162 *

## 2021-07-15 LAB — SODIUM, URINE: Sodium,UR: 73 mmol/L

## 2021-07-15 LAB — OSMOLALITY, URINE: Osmolality,UR: 514 mOsm/Kg H2O (ref 300–900)

## 2021-07-18 ENCOUNTER — Other Ambulatory Visit: Payer: Self-pay | Admitting: Oncology

## 2021-07-18 ENCOUNTER — Encounter: Payer: Self-pay | Admitting: Gastroenterology

## 2021-07-18 DIAGNOSIS — E119 Type 2 diabetes mellitus without complications: Secondary | ICD-10-CM

## 2021-07-18 DIAGNOSIS — Z794 Long term (current) use of insulin: Secondary | ICD-10-CM

## 2021-07-19 ENCOUNTER — Other Ambulatory Visit: Payer: Self-pay | Admitting: Oncology

## 2021-07-19 DIAGNOSIS — E1165 Type 2 diabetes mellitus with hyperglycemia: Secondary | ICD-10-CM

## 2021-07-19 MED ORDER — ACCU-CHEK AVIVA PLUS VI STRP *A*
ORAL_STRIP | 5 refills | Status: DC
Start: 2021-07-19 — End: 2021-07-21

## 2021-07-20 ENCOUNTER — Telehealth: Payer: Self-pay | Admitting: Oncology

## 2021-07-20 MED ORDER — ACCU-CHEK FASTCLIX LANCETS MISC
5 refills | Status: DC
Start: 2021-07-20 — End: 2022-06-05

## 2021-07-20 NOTE — Telephone Encounter (Signed)
[facility] TELEPHONE TRIAGE     Reason for call: [name] requested to schedule an appointment with Drema Balzarine. [name] did not wish to provide reasoning for appointment, requesting to speak directly to Esmond. [name] states she needs to schedule an appointment on a Monday and it will be without the patient.     Name of caller: Virgina Organ. for Angel Wells  Relationship to patient: PDN  Phone: [phone]

## 2021-07-21 ENCOUNTER — Encounter: Payer: Self-pay | Admitting: Oncology

## 2021-07-21 NOTE — Telephone Encounter (Signed)
Received fax from pharmacy stating [facility] can only fill max 3/day anfd to change directions to "USE TID"

## 2021-07-21 NOTE — Progress Notes (Signed)
Incoming paperwork for completion to the [facility]  Date Received: 07/21/2021    Type of paperwork (sender:form type): [facility] Physician Order    Completed paperwork to be sent to:  Fax to [phone]    Special Requests:     Placed in box for: [name]

## 2021-07-21 NOTE — Telephone Encounter (Signed)
Writer spoke to Angel Wells, patient PDN. [name] requests new RX is sent to the pharmacy today so she may pick them up when she's scheduled to be with the patient.

## 2021-07-21 NOTE — Addendum Note (Signed)
Addended by: Benita Stabile on: 07/21/2021 03:49 PM     Modules accepted: Orders

## 2021-07-22 ENCOUNTER — Encounter: Payer: Self-pay | Admitting: Oncology

## 2021-07-22 ENCOUNTER — Encounter: Payer: Self-pay | Admitting: Gastroenterology

## 2021-07-22 MED ORDER — ACCU-CHEK AVIVA PLUS VI STRP *A*
ORAL_STRIP | 5 refills | Status: DC
Start: 2021-07-22 — End: 2022-01-12

## 2021-07-22 NOTE — Progress Notes (Signed)
Paperwork completed 07/22/2021  Sent as outlined in prior documentation

## 2021-07-22 NOTE — Progress Notes (Signed)
Incoming paperwork for completion to the [facility]  Date Received: 07/22/2021    Type of paperwork (sender:form type): [facility] Missed Visit Note 07/10/21 and 07/11/21    Completed paperwork to be sent to:  Fax to [phone]    Special Requests:     Placed in box for: [name]

## 2021-07-26 ENCOUNTER — Other Ambulatory Visit: Payer: Self-pay | Admitting: Oncology

## 2021-07-26 NOTE — Telephone Encounter (Signed)
This encounter was created in error - please disregard.

## 2021-07-26 NOTE — Progress Notes (Signed)
Paperwork completed 07/26/2021  Sent as outlined in prior documentation

## 2021-08-01 ENCOUNTER — Telehealth: Payer: Self-pay | Admitting: Oncology

## 2021-08-01 DIAGNOSIS — G8250 Quadriplegia, unspecified: Secondary | ICD-10-CM

## 2021-08-01 NOTE — Telephone Encounter (Addendum)
[place]     Reason for call: Anda Kraft states patient to receive PT services at [facility].  Anda Kraft inquired if patient could receive weight training at [facility] to help build arm strength and mobility. Anda Kraft inquired if this is a service PT could provide to patient at [facility].     Name of caller: Anda Kraft for [name]  Relationship to patient: PDN  Phone:  [phone]

## 2021-08-02 ENCOUNTER — Other Ambulatory Visit: Payer: Self-pay | Admitting: Oncology

## 2021-08-02 ENCOUNTER — Encounter: Payer: Self-pay | Admitting: Oncology

## 2021-08-02 MED ORDER — CALCIUM CARBONATE ANTACID 648 MG PO TABS *I*
600.0000 mg | ORAL_TABLET | Freq: Every day | ORAL | 5 refills | Status: DC
Start: 2021-08-02 — End: 2022-02-03

## 2021-08-02 NOTE — Addendum Note (Signed)
Addended by: Jaymes Graff on: 08/02/2021 01:22 PM     Modules accepted: Orders

## 2021-08-02 NOTE — Telephone Encounter (Signed)
Pts nurse calling for an update

## 2021-08-02 NOTE — Progress Notes (Signed)
Incoming paperwork for completion to the [facility]  Date Received: 08/02/2021    Type of paperwork (sender:form type): [facility]  Missed visit    Completed paperwork to be sent to:  Fax to [phone]    Special Requests:     Placed in box for: [name]

## 2021-08-02 NOTE — Addendum Note (Signed)
Addended by: Homero Fellers A on: 08/02/2021 01:59 PM     Modules accepted: Orders

## 2021-08-03 ENCOUNTER — Other Ambulatory Visit: Payer: Self-pay | Admitting: Pediatrics

## 2021-08-04 ENCOUNTER — Telehealth: Payer: Self-pay | Admitting: Oncology

## 2021-08-04 DIAGNOSIS — Z9911 Dependence on respirator [ventilator] status: Secondary | ICD-10-CM

## 2021-08-04 NOTE — Progress Notes (Signed)
Paperwork completed 08/04/2021  Sent as outlined in prior documentation

## 2021-08-04 NOTE — Telephone Encounter (Addendum)
[facility] TELEPHONE TRIAGE     Reason for call: [name] states patients suction machine has been serviced and replaced by [employer]. [name] requests Rx order is sent to [facility] stating, "portable suction machine repair and replace" for billing purposes. Rx order pended for provider review. PSS to fax order to Patsy Lager [phone]    Name of caller: Virgina Organ. to [name]  Relationship to patient: PDN  Phone: [phone]

## 2021-08-04 NOTE — Telephone Encounter (Signed)
[facility]  Prior Authorization Request    Date of request: 08/04/2021  Time of call: 9:52 AM    Medication Requiring PA: calcium carbonate 650 mg tablet    Directions(dosing and frequency): Take 1 tablet (650 mg total) by mouth daily Via G Tube    Quantity/ Day Supply: 30 Tablets    Dx code:    Request received from:CCC PA Source: Pharmacy    Name and Phone Number of Pharmacy: [facility] [phone]      Additional Pharmacy Benefit plan: no    Secondary insurance: Yes    Please run secondary if not covered under primary: yes  Medicaid    Insurance coverage confirmed: yes    Covered Alternative Medication:     Tax ID: [id number]    Provider and NPI: [name]- [id number]    Additional Justification:n/a    Key ID: [id number]    Please provide justification for PA submission & urgency or indicate which covered alternative was ordered

## 2021-08-05 MED ORDER — GENERIC DME *A*
0 refills | Status: DC
Start: 2021-08-05 — End: 2022-06-23

## 2021-08-08 NOTE — Telephone Encounter (Signed)
Faxed

## 2021-08-09 ENCOUNTER — Telehealth: Payer: Self-pay

## 2021-08-09 NOTE — Telephone Encounter (Signed)
LVM for patient confirming appointment date and time for 2.1 arrival time 12:45 with Mordecai Rasmussen

## 2021-08-10 ENCOUNTER — Other Ambulatory Visit: Payer: Self-pay

## 2021-08-10 ENCOUNTER — Ambulatory Visit: Payer: Medicare Other | Attending: Oncology | Admitting: Rehabilitative and Restorative Service Providers"

## 2021-08-10 ENCOUNTER — Other Ambulatory Visit: Payer: Self-pay | Admitting: Oncology

## 2021-08-10 DIAGNOSIS — G8250 Quadriplegia, unspecified: Secondary | ICD-10-CM | POA: Insufficient documentation

## 2021-08-10 DIAGNOSIS — Z794 Long term (current) use of insulin: Secondary | ICD-10-CM
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
## 2021-08-11 ENCOUNTER — Telehealth: Payer: Self-pay | Admitting: Rehabilitative and Restorative Service Providers"

## 2021-08-11 NOTE — Telephone Encounter (Signed)
Writer was informed by Hermelinda Dellen, PT, DPT who evaluated this patient yesterday (08/11/21) that patient's nurse reported that [name] had not received a wheelchair in >5 years. Writer checked into this as Probation officer submitted WC documentation to [employer] back in August of 2021. Writer confirmed with Virgina Organ at [employer] that manual tilt in space wheelchair had been delivered to the family on 04/14/2020. Writer called to clarify this with Anda Kraft, [name]'s registered nurse who reports that they do in fact have the wheelchair and are just looking to have the arm rests widened or add a tray. ATP Eyod is scheduled to visit to adjust the wheelchair on 08/25/21 and family will discuss any modifications with Eyod at that visit. No further follow up is needed from writer at this point.     Sincerely,   Barbaraann Rondo, PT, DPT

## 2021-08-12 ENCOUNTER — Telehealth: Payer: Self-pay | Admitting: Oncology

## 2021-08-12 NOTE — Progress Notes (Signed)
Physical Therapy Exercise Flowsheet:  *Please refer to Physical Therapy Daily Flowsheet for further details of this session.*     08/10/21 1300   Shoulder Exercises   additional exercise PROM of UE's and LE's reviewed with pt and nurse   additional exercise cervical rotation: left: AAROM, instructed for HEP   additional exercise Right UE shoulder flexion: 1#, instructed in HEP   Total time 15 (ther ex)   Functional Activities Exercises   Functional Activities Exercises Yes     Mordecai Rasmussen, PT, DPT

## 2021-08-12 NOTE — Progress Notes (Signed)
Physical Therapy Daily Flowsheet:  *Please see Physical Therapy Exercise Flowsheet for details regarding exercises completed this session.*     08/10/21 1300   Overview   Diagnosis Spastic quadriplegia   Insurance Medicare;Medicaid   Script Date 08/02/21   Visit # 1   Pain Assessment   Pain Denies   Patient Education   Educated in disease process Yes   Educated in home exercise program Yes   Educated in proper postural mechanics and management Yes   Time Calculation   PT Timed Codes 15   PT Untimed Codes 25   PT Unbilled Time 0   PT Total Treatment 40     Mordecai Rasmussen, PT, DPT

## 2021-08-16 ENCOUNTER — Telehealth: Payer: Self-pay | Admitting: Oncology

## 2021-08-16 NOTE — Telephone Encounter (Signed)
Please fax to [facility] with most recent visit note showing need for the vent

## 2021-08-18 ENCOUNTER — Other Ambulatory Visit
Admission: RE | Admit: 2021-08-18 | Discharge: 2021-08-18 | Disposition: A | Discharge status: Home / Self Care | Payer: Medicare Other | Source: Ambulatory Visit | Attending: Oncology | Admitting: Oncology

## 2021-08-18 ENCOUNTER — Encounter: Payer: Self-pay | Admitting: Oncology

## 2021-08-18 ENCOUNTER — Telehealth: Payer: Self-pay | Admitting: Oncology

## 2021-08-18 DIAGNOSIS — Z0189 Encounter for other specified special examinations: Secondary | ICD-10-CM | POA: Insufficient documentation

## 2021-08-18 LAB — GRAM STAIN

## 2021-08-18 NOTE — Telephone Encounter (Signed)
[facility] TELEPHONE INTAKE    Reason for call: [name] called stating pt has had 7 less than 5 second seziures within the past 12 hours. [name] says was able to collect a thick greenish/yellowish and is requesting an order be faxed to Debbie at [phone] to be sent into lab    Name of caller: [name]  Relationship to patient: Nurse  Organization (if applicable):   Phone:  [phone]  Fax (if applicable):

## 2021-08-18 NOTE — Telephone Encounter (Signed)
Spoke with Angel Wells  Wanted to add that the pt has rhonchi and wheezing in his left lung that is new  Angel Wells is dropping off a sputum sample today    Booked in morning same day with Dr Beaulah Corin tomorrow

## 2021-08-18 NOTE — Telephone Encounter (Signed)
Writer spoke to [place], [place]. Angel Wells states she only has someone to drop off lab collection right now. Angel Wells requested to be transferred directly to RN.

## 2021-08-18 NOTE — Telephone Encounter (Signed)
Error

## 2021-08-18 NOTE — Telephone Encounter (Signed)
Spoke with [name]  Pt has been doing well, no witnessed seizures for 2 months now  Then in the last 12 hours, he has had 6 "little seizures"  Normally [name] said she wouldn't think much of this, but pts dad said "he must be brewing something"  With the last seizure, kate suctioned pt and collected thick, copious yellow/green mucous    Vitals stable  BG was on the low side the past 2 days (88/87) but today is normal at 109  Only other thing is [name] said pt has been "clingy" asking for a lot of hugs    Inquiring if a sputum ample is desired?  Also if provider would like blood work completed  Asking for a call back asap  Routing to provider

## 2021-08-19 ENCOUNTER — Other Ambulatory Visit: Payer: Self-pay | Admitting: Gastroenterology

## 2021-08-19 ENCOUNTER — Other Ambulatory Visit: Payer: Self-pay

## 2021-08-19 ENCOUNTER — Ambulatory Visit: Payer: Medicare Other | Attending: Primary Care | Admitting: Primary Care

## 2021-08-19 VITALS — BP 154/92 | HR 95 | Temp 96.7°F | Wt 180.2 lb

## 2021-08-19 DIAGNOSIS — Z930 Tracheostomy status: Secondary | ICD-10-CM | POA: Insufficient documentation

## 2021-08-19 DIAGNOSIS — Z20822 Contact with and (suspected) exposure to covid-19: Secondary | ICD-10-CM | POA: Insufficient documentation

## 2021-08-19 DIAGNOSIS — Z9911 Dependence on respirator [ventilator] status: Secondary | ICD-10-CM | POA: Insufficient documentation

## 2021-08-19 DIAGNOSIS — R093 Abnormal sputum: Secondary | ICD-10-CM | POA: Insufficient documentation

## 2021-08-19 DIAGNOSIS — Z20828 Contact with and (suspected) exposure to other viral communicable diseases: Secondary | ICD-10-CM | POA: Insufficient documentation

## 2021-08-19 LAB — DATE/TIME NOT PROVIDED

## 2021-08-19 NOTE — Progress Notes (Signed)
[facility]    Office Visit Note     REASON FOR VISIT      Chief Complaint   Patient presents with   . Wheezing     With [facility]. Having wheezing and increased sputum and increased seizure activity that last 5 sec or less. He had been seizure free for the past 5 months          PRIMARY DIAGNOSIS      Spastic quadriplegia    Subjective     SUBJECTIVE      Respiratory illness    Started 2 days ago with increasing thick green sputum.   No fevers   Home COVID test yesterday was negative   O2 levels have been stable 90-98%. Not requiring supplemental oxygen.   Nocturnal vent settings are the same, no alarms.   Energy levels seem the same.   Has been having more frequent seizures.   On inhaled tobramycin at home.       Currently vesting TID    ROS  Review of Systems as per HPI above    Past Medical History, Social History, Family History, and Medications/allergies reviewed during this visit    Current Outpatient Medications   Medication   . insulin aspart (NOVOLOG FLEXPEN) 100 UNIT/ML injection pen   . generic DME   . bisacodyl (DULCOLAX) 10 mg suppository   . calcium carbonate 650 mg tablet   . levETIRAcetam (KEPPRA) 100 mg/mL solution   . blood glucose (ACCU-CHEK AVIVA PLUS) test strip   . Accu-Chek FastClix Lancets MISC   . baclofen (LIORESAL) 10 mg tablet   . TOBI 300 MG/5ML nebulizer solution   . carbamide peroxide (DEBROX) 6.5 % otic solution   . lacosamide (VIMPAT) 10 mg/mL SOLN oral solution   . HM SENNA 8.6 MG tablet   . valproate (VALPROIC ACID) 50 mg/mL syrup   . Insulin Glargine, 2 Unit Dial, (TOUJEO MAX SOLOSTAR) 300 UNIT/ML injection pen   . nystatin (MYCOSTATIN) 100,000 Units/mL suspension   . generic DME   . generic DME   . generic DME   . ipratropium (ATROVENT) 0.02 % nebulizer solution   . generic DME   . budesonide (PULMICORT) 0.25 mg/64mL nebulizer suspension   . B-D UF III MINI PEN NEEDLES 31G X 5 MM   . generic DME   . nystatin (MYCOSTATIN) 100000 UNIT/GM cream   .  metFORMIN (GLUCOPHAGE) 500 MG/5ML solution   . sodium chloride 0.9% 0.9 % irrigation   . syringe disposable (BD LUER-LOK) 10 ML   . famotidine (PEPCID) 20 mg tablet   . chlorhexidine (PERIDEX) 0.12 % solution   . Nebulizer MISC   . LACTULOSE 10 GM/15ML solution   . fluticasone (FLONASE) 50 MCG/ACT nasal spray   . clonazePAM (KLONOPIN) 1 mg tablet   . oxygen   . albuterol (PROVENTIL) (2.5 mg/82mL) 0.083% nebulizer solution   . generic DME   . generic DME   . ibuprofen (ADVIL,MOTRIN) 600 mg tablet   . generic DME   . cetirizine (ZYRTEC) 10 mg tablet   . Misc. Devices (PULSE OXIMETER) MISC   . Syringe Disposable 60 ML MISC   . generic DME   . generic DME   . generic DME   . suction machine   . generic DME   . generic DME   . docusate sodium (DOCUSATE SODIUM) 10 mg/mL liquid   . generic DME   . GLUCAGON EMERGENCY 1 MG injection kit   .  Cholecalciferol 25 MCG (1000 UT) CHEW   . Non-System Medication   . MIC-KEY gastrostomy kit 14FR   . EPINEPHrine (EPIPEN) 0.3 mg/0.3 mL auto-injector   . lactobacillus rhamnosus, GG, (CULTURELLE) capsule   . nystatin (NYSTATIN) powder   . sodium chloride 0.9 % nebulizer solution   . triamcinolone (KENALOG) 0.025 % cream   . zinc oxide (DESITIN) 40 % paste   . generic DME   . MIC-KEY gastrostomy feeding tube extension set 12"   . generic DME   . generic DME   . MIC-KEY gastrostomy feeding tube extension set 24"   . gauze pads 2"X2"   . Nebulizers (PARI LC PLUS VIOS PRO NEB) MISC   . generic DME   . generic DME   . nebulizer device with mask and tubing   . generic DME   . generic DME   . generic DME   . Gauze Pads & Dressings (SPLIT GAUZE DRAINAGE SPONGE) 4"X4"   . generic DME   . generic DME   . gauze sponge (CURITY) 4"X4" pads   . generic DME   . Adhesive Tape 1"x5yd TAPE   . generic DME   . generic DME   . surgical lubricant (SURGILUBE) gel   . Distilled Water LIQD   . generic DME   . generic DME   . generic DME   . generic DME   . generic DME   . diphenhydrAMINE (BENADRYL) 25 MG oral  solid   . FIBER SELECT GUMMIES PO   . Multiple Vitamins-Minerals (HM MULTIVITAMIN ADULT GUMMY PO)   . Ascorbic Acid (VITAMIN C ADULT GUMMIES PO)   . oxygen   . Blood Glucose Monitoring Suppl (FIFTY50 GLUCOSE METER 2.0) w/Device KIT   . miconazole (MICATIN) 2 % powder   . insulin pen needle (FIFTY50 PEN NEEDLES) 31G X 8 MM     No current facility-administered medications for this visit.          Objective     OBJECTIVE      BP (!) 154/92   Pulse 95   Temp 35.9 C (96.7 F) (Temporal)   Wt 81.7 kg (180 lb 3.2 oz)   SpO2 98%   BMI 29.99 kg/m     Physical Exam  Gen: sitting comfortably, in no distress.   Eyes: clear conjunctivae, anicteric sclerae   ENT: MMM  CV: RRR, normal S1S2, no m/g/r   Resp: ventilated respirations, lungs CTAB, no w/r/c   GI: Round, soft, NT, ND, normoactive BS.    Skin: warm, dry, no rash     ASSESSMENT / DIAGNOSIS     Increased sputum production  Ventilator dependent with tracheostomy   Check flu, RSV, COVID-19 PCR   Check chest x-ray to evaluate for pneumonia.   Given exam findings, stability of oxygenation, and overall well appearance will hold on antibiotics.   Encouraged intensification of airway clearance regimen-increase to 4 times daily   We will defer seizure management to neurology, though suspect that this current infection is the cause of his more frequent seizures.      Orders Placed This Encounter   . Influenza A PCR   . Influenza B PCR   . RSV PCR   . Influenza A PCR   . Influenza B PCR   . RSV PCR   . *Chest standard frontal and lateral views   . COVID-19 PCR   . COVID-19 PCR   . Date/time not provided   --Patient instructed to call if  symptoms are not improving or worsening  --Follow-up arranged      Electronically signed by [name], MD , 08/19/2021 @   [facility], Phone: [phone]

## 2021-08-20 LAB — INFLUENZA B PCR: Influenza B PCR: 0

## 2021-08-20 LAB — COVID-19 NAAT (PCR): COVID-19 NAAT (PCR): NEGATIVE

## 2021-08-20 LAB — RSV PCR: RSV PCR: 0

## 2021-08-20 LAB — INFLUENZA A: Influenza A PCR: 0

## 2021-08-20 LAB — AEROBIC CULTURE: Aerobic Culture: 0

## 2021-08-23 ENCOUNTER — Telehealth: Payer: Self-pay | Admitting: Oncology

## 2021-08-23 DIAGNOSIS — Z9911 Dependence on respirator [ventilator] status: Secondary | ICD-10-CM

## 2021-08-23 NOTE — Telephone Encounter (Signed)
Please fax to [employer] when signed

## 2021-08-23 NOTE — Telephone Encounter (Signed)
Left message to call office and ask to speak with nurse.    If they call back please see if phone nurse is available, if not please notify that nurse will call them back.

## 2021-08-23 NOTE — Telephone Encounter (Signed)
[place]    Reason for call: Anda Kraft is requesting to go back to baseline vesting. Please advise    Name of caller: Anda Kraft  Relationship to patient: Nurse  Organization (if applicable):   Phone:  [phone]  Fax (if applicable):

## 2021-08-24 ENCOUNTER — Encounter: Payer: Self-pay | Admitting: Oncology

## 2021-08-24 MED ORDER — GENERIC DME *A*
0 refills | Status: DC
Start: 2021-08-24 — End: 2022-06-23

## 2021-08-24 NOTE — Progress Notes (Signed)
Incoming paperwork for completion to the [facility]  Date Received: 08/24/2021    Type of paperwork:    Sender/organization: [employer]    Form type: physician order     Any specific/special request? no    Copy and scan before mailing? No    Completed paperwork to be sent to:  Fax to [phone]    Placed in box for: Angel Wells Angel Wells  4:01 PM  08/24/2021

## 2021-08-25 ENCOUNTER — Encounter: Payer: Self-pay | Admitting: Gastroenterology

## 2021-08-25 ENCOUNTER — Other Ambulatory Visit: Payer: Self-pay | Admitting: Oncology

## 2021-08-25 NOTE — Progress Notes (Signed)
Paperwork completed 08/25/2021  Sent as outlined in prior documentation

## 2021-08-25 NOTE — Telephone Encounter (Signed)
Outgoing paperwork:    Completed paperwork received on  08/25/2021  Sent to scanning? Yes  Sent as outlined in prior documentation: Fax to [phone] [facility]    Tamia Dial L Debbie Bellucci  2:07 PM  08/25/2021

## 2021-08-25 NOTE — Telephone Encounter (Signed)
Spoke to [place], Charity fundraiser.  Pt doing great, no acute concerns.  Acute sick vesting = 4 times a day.  Gave verbal order to decrease to baseline of 3 times a day.  Will send form to acknowledge verbal order.    Requested additional PT/OT visits.  Feels there is more to be learned and more benefit to be had for the pt with increased visits.  Supervisor available to go with them on March 8th or 9th.  Would be willing to travel to see Irving Burton, PT.  OT as well.

## 2021-08-27 ENCOUNTER — Other Ambulatory Visit: Payer: Self-pay | Admitting: Pediatrics

## 2021-08-27 ENCOUNTER — Other Ambulatory Visit: Payer: Self-pay | Admitting: Primary Care

## 2021-08-27 DIAGNOSIS — M2450 Contracture, unspecified joint: Secondary | ICD-10-CM

## 2021-08-27 DIAGNOSIS — J4540 Moderate persistent asthma, uncomplicated: Secondary | ICD-10-CM

## 2021-08-27 LAB — LEGIONELLA CULTURE: Legionella Culture: 0

## 2021-08-29 ENCOUNTER — Encounter: Payer: Self-pay | Admitting: Oncology

## 2021-08-29 NOTE — Progress Notes (Signed)
Incoming paperwork for completion to the [place]  Date Received: 08/29/2021    Type of paperwork:    Sender/organization: [employer]   Form type: "Home Health Certification and Plan of Care"    Copy and scan before mailing? No    Completed paperwork to be sent to:  Fax to [facility] [phone]    Placed in box for: Ned Grace  11:53 AM  08/29/2021

## 2021-08-30 ENCOUNTER — Encounter: Payer: Self-pay | Admitting: Oncology

## 2021-08-30 ENCOUNTER — Other Ambulatory Visit: Payer: Self-pay | Admitting: Pediatrics

## 2021-08-30 NOTE — Telephone Encounter (Signed)
NA

## 2021-08-30 NOTE — Progress Notes (Signed)
[facility]  Prior Authorization Request    Date of request: 08/30/2021  Time of call: 11:08 AM    Medication Requiring [place]:CSPZZCKI EMERGENCY 1 MG injection kit    Directions(dosing and frequency): Use as directed NEEDED for blood glucose <70    Quantity/ Day Supply: 1 Each    Dx code: E11.65, Z79.4    Request received from:CCC PA Source: Patient    Name and Phone Number of Pharmacy:      Additional Pharmacy Benefit plan: no    Secondary insurance: Yes    Please run secondary if not covered under primary: yes  Medicaid    Insurance coverage confirmed: yes    Covered Alternative Medication:     Tax ID: [id number]    Provider and NPI: [place]- [id number]    Additional Justification:n/a    Key ID:     Please provide justification for PA submission & urgency or indicate which covered alternative was ordered

## 2021-08-30 NOTE — Progress Notes (Signed)
Incoming paperwork for completion to the [place]  Date Received: 08/30/2021    Type of paperwork:    Sender/organization: [employer]    Form type: "Missed Visits"      Copy and scan before mailing? No    Completed paperwork to be sent to:  Fax to Attn: Precious Gilding [phone]    Placed in box for: Ned Grace  11:42 AM  08/30/2021

## 2021-08-30 NOTE — Progress Notes (Signed)
Advised pharmacy PA was approved

## 2021-09-06 ENCOUNTER — Telehealth: Payer: Self-pay | Admitting: Oncology

## 2021-09-06 NOTE — Telephone Encounter (Signed)
Called and spoke with home nurse [name], she says that she is currently not able to review book that has vent settings. She offered to assist next day in the am.     Writer will call back next day to review vent settings with [name]

## 2021-09-06 NOTE — Telephone Encounter (Signed)
[facility] TELEPHONE INTAKE    Reason for call: Darl Pikes from [facility] called and says she needs Ventilator settings and most recent office note. She Is requesting a call back.  Name of caller:Susan  Organization (if applicable):   Phone: [phone]  Fax (if applicable): [phone]

## 2021-09-07 ENCOUNTER — Encounter: Payer: Self-pay | Admitting: Gastroenterology

## 2021-09-07 NOTE — Progress Notes (Signed)
Paperwork completed 09/07/2021  Sent as outlined in prior documentation

## 2021-09-07 NOTE — Telephone Encounter (Signed)
Writer spoke to [employer]. Angel Wells requested to receive a return call from [employer] to relay urgent information. Angel Wells did not wish to provide further information regarding call.   [phone]

## 2021-09-08 NOTE — Telephone Encounter (Signed)
Returning nurse call.

## 2021-09-09 ENCOUNTER — Telehealth: Payer: Self-pay | Admitting: Oncology

## 2021-09-09 NOTE — Telephone Encounter (Signed)
[place]    Reason for call: Anda Kraft states pts Fiber gummies have run out. Please advise    Name of caller: Levell July  Relationship to patient: Nurse  Organization (if applicable):   Phone:  [phone]  Fax (if applicable):

## 2021-09-09 NOTE — Telephone Encounter (Signed)
Writer received email and forwarded it to [employer] and [employer] to review.     Called [name] to let her know that I received her email as well

## 2021-09-09 NOTE — Telephone Encounter (Signed)
Writer spoke with [name], whom states the family is out of fiber gummies. This is not ordered by the [facility] but she is required to notify our office in the event of a missed medication. She is coordinating with family to have gummies restocked at home.     Gary Fleet, RN

## 2021-09-12 NOTE — Telephone Encounter (Signed)
Letters printed and placed in outgoing fax bin

## 2021-09-13 ENCOUNTER — Telehealth: Payer: Self-pay | Admitting: Oncology

## 2021-09-13 NOTE — Telephone Encounter (Signed)
[facility] TELEPHONE INTAKE    Reason for call: Pts nurse [name] is requesting a call from [facility] regarding a letter of justification for a padded tray for the pts wheelchair. Please advise    Name of caller: [name]  Relationship to patient: Nurse  Organization (if applicable):   Phone:  [phone]  Fax (if applicable):

## 2021-09-14 ENCOUNTER — Other Ambulatory Visit: Payer: Self-pay | Admitting: Oncology

## 2021-09-14 NOTE — Telephone Encounter (Signed)
Pts nurse is also requesting Mucinex 400mg  2x daily via G tube

## 2021-09-15 ENCOUNTER — Other Ambulatory Visit: Payer: Self-pay

## 2021-09-15 ENCOUNTER — Ambulatory Visit: Payer: Medicare Other | Attending: Oncology | Admitting: Oncology

## 2021-09-15 VITALS — HR 99 | Temp 97.2°F | Wt 162.5 lb

## 2021-09-15 DIAGNOSIS — Z794 Long term (current) use of insulin: Secondary | ICD-10-CM | POA: Insufficient documentation

## 2021-09-15 DIAGNOSIS — E1165 Type 2 diabetes mellitus with hyperglycemia: Secondary | ICD-10-CM | POA: Insufficient documentation

## 2021-09-15 DIAGNOSIS — G8250 Quadriplegia, unspecified: Secondary | ICD-10-CM | POA: Insufficient documentation

## 2021-09-15 DIAGNOSIS — Z23 Encounter for immunization: Secondary | ICD-10-CM | POA: Insufficient documentation

## 2021-09-15 MED ORDER — TOUJEO MAX SOLOSTAR 300 UNIT/ML SC SOPN
85.0000 [IU] | PEN_INJECTOR | Freq: Every evening | SUBCUTANEOUS | 3 refills | Status: DC
Start: 2021-09-15 — End: 2021-09-16

## 2021-09-15 MED ORDER — GUAIFENESIN 400 MG PO TABS *I*
400.0000 mg | ORAL_TABLET | Freq: Two times a day (BID) | ORAL | 11 refills | Status: DC
Start: 2021-09-15 — End: 2022-01-03

## 2021-09-15 NOTE — Patient Instructions (Addendum)
Due for diabetic eye exam    Decrease toujeo to 85 units nightly    Pneumonia vaccine given today

## 2021-09-15 NOTE — Progress Notes (Signed)
[place]    Office Visit Note     REASON FOR VISIT      Chief Complaint   Patient presents with    Follow-up     Access application. Pt wants mucinex meds . Pt has no pain.         PRIMARY DIAGNOSIS          Subjective     SUBJECTIVE      Angel Wells was accompanied by caregiver who provided all of subjective history         Working on increasing muscle strength  Hamstring stretches are eliciting abdominal contractions- getting core strength  Working on arm strength with wrist weights - trying 1lb dumb bell  Goals of being able to feed himself and play video games  He is able to raise his arm half way for hygiene which he was not able to do in the past  Would like to continue PT and OT - do not feel he has gained all the progress he can    DM  Continues with 90 units long acting  10 units short acting at every meal  Post meal less than 180  Fasting < 125     Pneumonia vaccine consent from RN on behalf of dad    ROS  Review of Systems as per HPI above    Past Medical History, Social History, Family History, and Medications/allergies reviewed during this visit    Current Outpatient Medications   Medication    guaifenesin (QC MEDIFIN 400) 400 mg TABS    senna (HM SENNA) 8.6 mg tablet    nystatin (MYCOSTATIN) powder    baclofen (LIORESAL) 10 mg tablet    GLUCAGON EMERGENCY 1 MG injection kit    budesonide (PULMICORT) 0.25 mg/66m nebulizer suspension    metFORMIN (GLUCOPHAGE) 500 MG/5ML solution    generic DME    insulin aspart (NOVOLOG FLEXPEN) 100 UNIT/ML injection pen    generic DME    bisacodyl (DULCOLAX) 10 mg suppository    calcium carbonate 650 mg tablet    levETIRAcetam (KEPPRA) 100 mg/mL solution    blood glucose (ACCU-CHEK AVIVA PLUS) test strip    Accu-Chek FastClix Lancets MISC    TOBI 300 MG/5ML nebulizer solution    carbamide peroxide (DEBROX) 6.5 % otic solution    lacosamide (VIMPAT) 10 mg/mL SOLN oral solution    valproate (VALPROIC ACID) 50 mg/mL syrup    Insulin  Glargine, 2 Unit Dial, (TOUJEO MAX SOLOSTAR) 300 UNIT/ML injection pen    nystatin (MYCOSTATIN) 100,000 Units/mL suspension    generic DME    generic DME    generic DME    ipratropium (ATROVENT) 0.02 % nebulizer solution    generic DME    B-D UF III MINI PEN NEEDLES 31G X 5 MM    generic DME    nystatin (MYCOSTATIN) 100000 UNIT/GM cream    sodium chloride 0.9% 0.9 % irrigation    syringe disposable (BD LUER-LOK) 10 ML    famotidine (PEPCID) 20 mg tablet    chlorhexidine (PERIDEX) 0.12 % solution    Nebulizer MISC    LACTULOSE 10 GM/15ML solution    fluticasone (FLONASE) 50 MCG/ACT nasal spray    clonazePAM (KLONOPIN) 1 mg tablet    oxygen    albuterol (PROVENTIL) (2.5 mg/364m 0.083% nebulizer solution    generic DME    generic DME    ibuprofen (ADVIL,MOTRIN) 600 mg tablet    generic DME    cetirizine (  ZYRTEC) 10 mg tablet    Misc. Devices (PULSE OXIMETER) MISC    Syringe Disposable 60 ML MISC    generic DME    generic DME    generic DME    suction machine    generic DME    generic DME    docusate sodium (DOCUSATE SODIUM) 10 mg/mL liquid    generic DME    Cholecalciferol 25 MCG (1000 UT) CHEW    Non-System Medication    MIC-KEY gastrostomy kit 14FR    EPINEPHrine (EPIPEN) 0.3 mg/0.3 mL auto-injector    lactobacillus rhamnosus, GG, (CULTURELLE) capsule    sodium chloride 0.9 % nebulizer solution    triamcinolone (KENALOG) 0.025 % cream    zinc oxide (DESITIN) 40 % paste    generic DME    MIC-KEY gastrostomy feeding tube extension set 12"    generic DME    generic DME    MIC-KEY gastrostomy feeding tube extension set 24"    gauze pads 2"X2"    Nebulizers (PARI LC PLUS VIOS PRO NEB) MISC    generic DME    generic DME    nebulizer device with mask and tubing    generic DME    generic DME    generic DME    Gauze Pads & Dressings (SPLIT GAUZE DRAINAGE SPONGE) 4"X4"    generic DME    generic DME    gauze sponge (CURITY) 4"X4" pads    generic DME    Adhesive Tape  1"x5yd TAPE    generic DME    generic DME    surgical lubricant (SURGILUBE) gel    Distilled Water LIQD    generic DME    generic DME    generic DME    generic DME    generic DME    diphenhydrAMINE (BENADRYL) 25 MG oral solid    FIBER SELECT GUMMIES PO    Multiple Vitamins-Minerals (HM MULTIVITAMIN ADULT GUMMY PO)    Ascorbic Acid (VITAMIN C ADULT GUMMIES PO)    oxygen    Blood Glucose Monitoring Suppl (FIFTY50 GLUCOSE METER 2.0) w/Device KIT    miconazole (MICATIN) 2 % powder    insulin pen needle (FIFTY50 PEN NEEDLES) 31G X 8 MM     No current facility-administered medications for this visit.          Objective     OBJECTIVE      Pulse 99    Temp 36.2 C (97.2 F) (Temporal)    Wt 73.7 kg (162 lb 8 oz)    SpO2 98%    BMI 27.04 kg/m     Physical Exam  Constitutional:       General: He is not in acute distress.     Appearance: He is not ill-appearing, toxic-appearing or diaphoretic.   Eyes:      General: No scleral icterus.  Cardiovascular:      Heart sounds: Normal heart sounds.   Pulmonary:      Effort: Pulmonary effort is normal. No respiratory distress.      Breath sounds: Normal breath sounds.   Abdominal:      General: Bowel sounds are normal. There is no distension.      Palpations: Abdomen is soft.   Musculoskeletal:      Comments: Able to raise arrns to 45 degrees   Skin:     General: Skin is warm and dry.   Neurological:      Mental Status: He is alert. Mental status is at baseline.  ASSESSMENT / DIAGNOSIS         1. Type 2 diabetes mellitus with hyperglycemia, with long-term current use of insulin  Decrease toujeo to 85 units  Repeat hemoglobin A1C due    2. Spastic quadriplegia  Referral placed for PT/OT to continue muscle strength gains  - AMB REFERRAL TO PHYSICAL / OCCUPATIONAL THERAPY  - AMB REFERRAL TO PHYSICAL / OCCUPATIONAL THERAPY      Orders Placed This Encounter    AMB REFERRAL TO PHYSICAL / OCCUPATIONAL THERAPY    AMB REFERRAL TO PHYSICAL / OCCUPATIONAL THERAPY     guaifenesin (QC MEDIFIN 400) 400 mg TABS     There are no Patient Instructions on file for this visit.       --Patient instructed to call if symptoms are not improving or worsening  --Follow-up arranged  No follow-ups on file.     I personally spent 30 minutes on the calendar day of the encounter, including pre and post visit work reviewing the EMR and management of this patient.     Electronically signed by Phylis Bougie, NP , 09/15/2021 @   [place], Phone: [phone]

## 2021-09-15 NOTE — Progress Notes (Signed)
Clinical Pharmacy Services_        Angel Wells is a 31 y.o. male referred for Pharmacotherapy Consult + Comprehensive Medication Management. Patient was referred by PCP for Medication Complexity/Polypharmacy. Patient's medications and allergies were updated and marked as reviewed, as appropriate. The following medication concerns were identified  . This is a Follow-up visit that was conducted via In Person.      Current Medication Regimen   Current Outpatient Medications   Medication Sig Dispense Refill    senna (HM SENNA) 8.6 mg tablet Administer 2 tablets via G tube daily 90 tablet 0    nystatin (MYCOSTATIN) powder apply topically 4 times daily  to the following areas: neck, groin, axilla 30 g 0    baclofen (LIORESAL) 10 mg tablet take 1 tablet (10 MILLIGRAM total) by mouth 2 times daily 180 tablet 0    GLUCAGON EMERGENCY 1 MG injection kit Use as directed AS NEEDED for blood glucose < 70 1 each 0    budesonide (PULMICORT) 0.25 mg/74m nebulizer suspension INHALE 2 MLS (0.25 MG TOTAL) BY NEBULIZATION 2 TIMES DAILY FOR ASTHMA 360 mL 0    metFORMIN (GLUCOPHAGE) 500 MG/5ML solution take 10 mls (1,000 MILLIGRAM total) by mouth 2 times daily (with meals) for type 2 diabetes 473 mL 0    generic DME Item to dispense: Multifunction Vent  Instructions for use: use 24 hours a day 1 each 0    insulin aspart (NOVOLOG FLEXPEN) 100 UNIT/ML injection pen inject sub-cutaneously before meals: nutritional bl: 10 units + one half (1/2) as follows: <70: hold bl, 70-200: bl, > 200: bl+2, >250: bl+4, >300: bl+6, >350: bl +8. alert md for bg <70 and >350. 45 each 5    generic DME Item to dispense: Portable Suction Machine Repair or Replace  Instructions for use: Use As Directed 1 each 0    bisacodyl (DULCOLAX) 10 mg suppository please give suppository every other day, please check with dad prior to giving 12 each 5    calcium carbonate 650 mg tablet Take 1 tablet (650 mg total) by mouth daily  Via G Tube 30 tablet 5     levETIRAcetam (KEPPRA) 100 mg/mL solution give 15 milliliter by g-tube twice daily 2700 mL 0    blood glucose (ACCU-CHEK AVIVA PLUS) test strip USE 1 STRIP THREE TIMES DAILY. DX: E11.65 AND Z79.4 100 each 5    Accu-Chek FastClix Lancets MISC 1 EACH BY MISC.(NON-DRUG COMBO ROUTE) ROUTE 3 (THREE) TIMES DAILY. E11.65 102 each 5    TOBI 300 MG/5ML nebulizer solution Inhale 5 mLs (300 mg total) by nebulization every 12 hours  for pseudomonas infection, trach/vented patient NEBULIZE 1 VIAL (300 MG) TWICE DAILY EVERY OTHER MONTH   DIAGNOSIS: E84.0 280 mL 5    carbamide peroxide (DEBROX) 6.5 % otic solution use 5 drops twice daily on the first 3 days of each month. 15 mL 5    lacosamide (VIMPAT) 10 mg/mL SOLN oral solution Take 10 MILLILITER per tube twice daily. ; MAXIMUM DAILY DOSE 20 MILLILITER 600 mL 5    valproate (VALPROIC ACID) 50 mg/mL syrup take 5 mls (250 milligram total) by mouth 3 times daily 1350 mL 5    Insulin Glargine, 2 Unit Dial, (TOUJEO MAX SOLOSTAR) 300 UNIT/ML injection pen Inject 90 units into the skin nightly  for Type 2 Diabetes 1 each 3    nystatin (MYCOSTATIN) 100,000 Units/mL suspension Swish and swallow 5 mLs (500,000 units total) 4 times daily  for Candidiasis Fungal Infection  of the Oropharynx Swish and hold in mouth before swallowing. 473 mL 0    generic DME Item to dispense: 2 Tracheostomies #7 Bivona Silicone Cuffed 80 mm Length  Instructions for use: use as directed 2 each 6    generic DME Item to dispense: hospital bed mattress Instructions for use: use as directed 1 each 0    generic DME Gel Overlay Mattress.  LON: 99, [id number]. Use as directed. G82.50, S06.9x9 1 each 0    ipratropium (ATROVENT) 0.02 % nebulizer solution INHALE 2.5 MLS (500 MCG TOTAL) BY NEBULIZATION 4 TIMES DAILY AS NEEDED FOR WHEEZING J45.4 62.5 mL 5    generic DME Item to dispense: Large Volume Nebulizer Pump 50 psi  Instructions for use: use as directed 1 each 0    B-D UF III MINI PEN NEEDLES 31G X 5 MM  USE 3 (THREE) TIMES DAILY AS NEEDED. 100 each 5    generic DME Non-sterile gloves size medium Use as directed. MCAID ID [id number] 2boxes/month 3 each 5    nystatin (MYCOSTATIN) 100000 UNIT/GM cream Apply topically 2 times daily  to the following areas: buttocks 15 g 11    sodium chloride 0.9% 0.9 % irrigation Irrigate with 250 mLs as directed 3 times daily 500 mL 0    syringe disposable (BD LUER-LOK) 10 ML Use as instructed for inflation of tracheostomy balloon. 5 each 11    famotidine (PEPCID) 20 mg tablet take 1 tablet by mouth 2 (two) times daily. ok to crush it and put it down tube if necessary 180 tablet 6    chlorhexidine (PERIDEX) 0.12 % solution use as directed 473 mL 11    Nebulizer MISC Disposable nebulizer kits to be used inline with trach  MCAID ID [id number] 4 each 5    LACTULOSE 10 GM/15ML solution administer 30 mls (20 g total) via g tube daily as needed (no stool in 48 hours) use as directed 946 mL 5    fluticasone (FLONASE) 50 MCG/ACT nasal spray spray 1 spray into nostril daily 16 g 5    clonazePAM (KLONOPIN) 1 mg tablet TAKE 1 TABLET BY MOUTH TWICE DAILY AS NEEDED (crush for g-tube) 10 tablet 0    oxygen 1-2 L/min to keep O2 sat above 92%, may increase up to 4 lpm as needed.  Low saturation alarm 85%, high alarm off, low HR 50, High HR 200 1 each 0    albuterol (PROVENTIL) (2.5 mg/35m) 0.083% nebulizer solution Inhale 3 mLs (2.5 mg total) by nebulization 3 times daily  for Spasm of Lung Air Passages with vesting.  When ill, use four times a day with vesting. 1050 mL 3    generic DME Item to dispense: Stationary Continuous Oximeter  Instructions for use: Use at night time Dx: Z99.11 1 each 0    generic DME Cough assist machine repairs for: [employer] respironics cough assist model T70. 1 each 0    ibuprofen (ADVIL,MOTRIN) 600 mg tablet Take 1 tablet (600 mg total) by mouth 4 times daily as needed for Pain for up to 30 doses 30 tablet 5    generic DME Repair, parts, and labor for coughing  assist machine. MCAID CIN: [id number] each 0    cetirizine (ZYRTEC) 10 mg tablet Take 1 tablet (10 mg total) by mouth nightly 90 tablet 5    Misc. Devices (PULSE OXIMETER) MISC Pulse Oximeter with Supplies, LON: 99, [id number], J96.11, Z99.11 1 each 0    Syringe Disposable 60 ML MISC Use as directed  5 each 5    generic DME Adhesive tape 1" x 5 yards. Use as directed. Disp 3 box 3 each 5    generic DME Suction canister Use as directed.MCAID ID [id number] 4 each 5    generic DME Suction tubing Use as directed.MCAID ID [id number] 4 each 5    suction machine Instructions for use: Use as needed for secretions, LON: 99 months, [id number] 1 each 0    generic DME Pulse ox probe replacement tape. Use as directed.[id number], LON 99 15 each 5    generic DME Pulse ox sensors (disposable)Use as directed.MCAID ID [id number], LON 99 4 each 5    docusate sodium (DOCUSATE SODIUM) 10 mg/mL liquid Take 10 mLs (100 mg total) by mouth 2 times daily (Patient not taking: Reported on 03/30/2021) 600 mL 5    generic DME [place] Percussion Vest Model # 105  55inch  Use as directed. 1 each 0    Cholecalciferol 25 MCG (1000 UT) CHEW Take 1,000 units by mouth daily  2 chews daily      Non-System Medication Ok to change Gtube at home by nursing staff every 3 months and PRN 1 each 0    MIC-KEY gastrostomy kit 14FR Please dispense every 3 months and prn, MicKey 14Fr 2.5cm 1 kit 3    EPINEPHrine (EPIPEN) 0.3 mg/0.3 mL auto-injector USE AS DIRECTED 2 each 0    lactobacillus rhamnosus, GG, (CULTURELLE) capsule Take 1 capsule (1 each total) by mouth daily 30 capsule 5    sodium chloride 0.9 % nebulizer solution Inhale 3 mLs by nebulization as needed for Wheezing  MCAID ID [id number] 90 mL 5    triamcinolone (KENALOG) 0.025 % cream Apply topically 2 times daily  APPLY TO AFFECTED AREA (Patient not taking: No sig reported) 15 g 2    zinc oxide (DESITIN) 40 % paste Apply topically as needed for Diaper Rash 57 g 1    generic DME TRACH TIES WITH O2  PORT. Use as directed.MCAID ID [id number] 30 each 11    MIC-KEY gastrostomy feeding tube extension set 12" Use as directed with gtube 1 kit 11    generic DME Adult size trach ties Use as directed. 30 each 5    generic DME Butter[employer] for use in wheelchair, dispense 1. Use as directed. Dx G80.0, S06.9x9, LON: lifetime 1 each 0    MIC-KEY gastrostomy feeding tube extension set 24" Use as directed, please dispense 4 per month 4 kit 5    gauze pads 2"X2" Use as directed around trach. MCAID ID [id number] 100 each 5    Nebulizers (PARI LC PLUS VIOS PRO NEB) MISC Pari Vios Pro Neb or comparable with Nebulizer kits and nebulizer trach mask. Use twice daily With albuterol.MCAID # [id number], MCARE [id number] 1 each 0    generic DME 51m Adapter for trach to be used as directed. Replace weekly. Dx.Z99.11 4 each 5    generic DME 15 mm adapters for nebulized therapies via trach  Medicare ID [id number] Medicaid ID [id number] each 11    nebulizer device with mask and tubing Use as directed 1 kit 0    generic DME Please dispense one 533msyringe. Use as directed. (Patient not taking: Reported on 03/30/2021) 1 each 5    generic DME MCAID ID [id number] CUFFED TRACH 7.0 Please change trach every month and as needed. 1 each 5    generic DME Please dispense 3054measuring cup. Use as directed. 1 each 5  Gauze Pads & Dressings (SPLIT GAUZE DRAINAGE SPONGE) 4"X4" Use once and PRN times a day as instructed. 30 each 5    generic DME 10 isothermal adult "antiflect" connector. MCAID [id number] Use as directed. 10 each 5    generic DME Suction catheter with chimney valve.Use as directed.MCAID ID [id number] 1 each 0    gauze sponge (CURITY) 4"X4" pads Use PRN as instructed.MCAID ID [id number] 50 each 5    generic DME PASSEY MUIR VALVE (PURPLE) Use as tolerated.MCAID ID [id number] 1 each 3    Adhesive Tape 1"x5yd TAPE By 1 Units no specified route as needed MCAID ID [id number] 3 each 5    generic DME 10 ml syringes Use as  directed.MCAID ID [id number] 2 each 5    generic DME Trach care kits Use as directed.MCAID ID [id number] 30 each 5    surgical lubricant (SURGILUBE) gel Apply topically as needed MCAID ID [id number] 60 g 5    Distilled Water LIQD Distilled water, use as directedMCAID ID [id number] 10 Bottle 5    generic DME Mepilex 4X 4. Use as directed.MCAID ID [id number] 1 each 5    generic DME HME for trach humidification Use as directed.MCAID ID [id number] 75 each 5    generic DME Cough Assist circuits Use as directed.MCAID ID [id number] 1 each 5    generic DME Suction filter Use as directed.MCAID ID [id number] 2 each 5    generic DME Nasal adapters Use as directed.MCAID ID [id number] 5 each 5    diphenhydrAMINE (BENADRYL) 25 MG oral solid 25 mg by Per G Tube route nightly as needed for Sleep (Patient not taking: Reported on 03/30/2021)      [place] Take by mouth Give 2 gummies daily      Multiple Vitamins-Minerals (HM MULTIVITAMIN ADULT GUMMY PO) Take by mouth Give 2 gummies daily      Ascorbic Acid (VITAMIN C ADULT GUMMIES PO) Take 250 mg by mouth daily  Give 2 gummies once daily       oxygen 2 l/m bleed through ventilator at night 1 each 0    Blood Glucose Monitoring Suppl (FIFTY50 GLUCOSE METER 2.0) w/Device KIT by Other route      miconazole (MICATIN) 2 % powder 2 times daily as needed       insulin pen needle (FIFTY50 PEN NEEDLES) 31G X 8 MM 1 each       No current facility-administered medications for this visit.     Assessment  With regards to medication management for this patient, the following aspects were reviewed: Indication, effectiveness, safety and convenience of each medication. The patient's medical conditions and medications were assessed, evaluated, and deemed meeting goals of drug therapy except as listed below:    Recommendations/Plan  1) Type 2 Diabetes  Current diabetes regimen: Lispro 10u TID w/ meals+ Toujeo 90u nightly + Metformin 105m BID  Toujeo was reduced on 07/06/21 from 110 units  to 90 units nightly. Has been doing well, for the most part FBG's are <1225mdL. Will reduce Toujeo further to 85 units nightly. Keep meal time insulin the same, they consistently have been giving 10units with each meal.    BG's 3/1-09/15/21 Time: BG mg/dl:   Breakfast 0930  0900  0945  1000  1030  1030  0830 131  129  107  104  119  101  111   Lunch 1245  1315  1300  1300  1455  1400  1345  1145 130  136  131  160  108  83  119  156   Dinner 1800  1730  1730  1900  1800  1800  1730 139  97  100  142  144  115  139       2) Health Maintenance  Eligible to receive Prevnar20 (hx of DM). Will get today.    ----------------------------------------------------------------------------------------------------------------  This report was sent to Sherrie Sport, NP for review and final approval. Patient/Caregiver was able to verbalize understanding and repeat back key educational points.    Thank you for allowing me to participate in the care of this patient. Please contact me with any questions.    Elvera Maria, PharmD., [place]  [address], [place], [place] [address]  [phone]

## 2021-09-16 ENCOUNTER — Other Ambulatory Visit: Payer: Self-pay | Admitting: Oncology

## 2021-09-16 ENCOUNTER — Other Ambulatory Visit: Payer: Self-pay

## 2021-09-16 DIAGNOSIS — J4540 Moderate persistent asthma, uncomplicated: Secondary | ICD-10-CM

## 2021-09-16 MED ORDER — TOUJEO MAX SOLOSTAR 300 UNIT/ML SC SOPN
84.0000 [IU] | PEN_INJECTOR | Freq: Every evening | SUBCUTANEOUS | 3 refills | Status: DC
Start: 2021-09-16 — End: 2021-11-24

## 2021-09-19 ENCOUNTER — Other Ambulatory Visit: Payer: Self-pay | Admitting: Pediatrics

## 2021-09-19 ENCOUNTER — Telehealth: Payer: Self-pay

## 2021-09-19 NOTE — Telephone Encounter (Signed)
Patient Name: Angel Wells   Birth Date: 26-Jul-1990   Address: [address] [place], [place] [address]   Sex: Male   Rx Written Rx Dispensed Drug Quantity Days Supply Prescriber Name Prescriber Dea # Payment Method Dispenser   06/06/2021 09/13/2021 lacosamide 10 mg/ml solution  600 30 Azucena Kuba (MD) [id number] Insurance [facility]   06/06/2021 08/15/2021 lacosamide 10 mg/ml solution  600 30 Azucena Kuba (MD) [id number] Insurance [facility]   07/13/2021 07/14/2021 lacosamide 10 mg/ml solution  600 30 Azucena Kuba (MD) [id number] Insurance [facility]   06/06/2021 07/05/2021 lacosamide 10 mg/ml solution  200 10 Azucena Kuba (MD) [id number] Insurance [facility]   06/06/2021 06/27/2021 lacosamide 10 mg/ml solution  200 10 Azucena Kuba (MD) [id number] Insurance [facility]   06/06/2021 06/17/2021 lacosamide 10 mg/ml solution  200 10 Azucena Kuba (MD) [id number] Insurance [facility]   06/06/2021 06/06/2021 lacosamide 10 mg/ml solution  200 10 Azucena Kuba (MD) [id number] Insurance [facility]   11/29/2020 05/26/2021 lacosamide 10 mg/ml solution  200 10 Azucena Kuba (MD) [id number] Insurance [facility]   11/29/2020 05/18/2021 lacosamide 10 mg/ml solution  200 10 Azucena Kuba (MD) [id number] Insurance [facility]   11/29/2020 05/04/2021 lacosamide 10 mg/ml solution  200 10 Azucena Kuba (MD) [id number] Insurance [facility]   11/29/2020 04/20/2021 lacosamide 10 mg/ml solution  200 10 Azucena Kuba (MD) [id number] Insurance [facility]   11/29/2020 04/08/2021 lacosamide 10 mg/ml solution  200 10 Azucena Kuba (MD) [id number] Insurance [facility]   11/29/2020 03/31/2021 lacosamide 10 mg/ml solution  200 10 Azucena Kuba (MD) [id number] Insurance [facility]   03/30/2021 03/31/2021 clonazepam 1 mg tablet  10 5 Palma Holter, [place] FNP-Bc [id number] Insurance [facility]   11/29/2020 03/18/2021  lacosamide 10 mg/ml solution  200 10 Azucena Kuba (MD) [id number] Insurance [facility]   11/29/2020 01/25/2021 vimpat 10 mg/ml solution  600 30 Azucena Kuba (MD) [id number] Insurance [facility]   11/29/2020 12/31/2020 vimpat 10 mg/ml solution  600 30 Azucena Kuba (MD) [id number] Insurance [facility]   11/29/2020 12/01/2020 vimpat 10 mg/ml solution  600 30 Azucena Kuba (MD) [id number] Insurance [facility]   05/14/2020 11/02/2020 vimpat 10 mg/ml solution  600 30 Azucena Kuba (MD) [id number] Insurance [facility]   05/14/2020 10/06/2020 vimpat 10 mg/ml solution  600 30 Azucena Kuba (MD) [id number] Insurance [facility]     * - Drugs marked with an asterisk are compound drugs. If the compound drug is made up of more than one controlled substance, then each controlled substance will be a separate row in the table.

## 2021-09-19 NOTE — Telephone Encounter (Addendum)
Marcelino Duster (account executive for [facility]) called and told me the RT at [facility] has been asked by the patient/family/nurse to change ventilators to the Vocsn which is an all in one unit containing O2 compressor, nebulizer, suction, cough assist as well as ventilator.  I have placed a call to [name] (nurse) to discuss. I have not worked with the [employer] personally and I don't have a problem with it, with the exception that if one component breaks down, the whole machine will need to be replaced/repaired.I will discuss with [name] when she calls back    [name] returned call and I told her I don't have personal experience but if this is the way they want to go, [name] returns the call and has questions regarding the switch to Vocsn. I am trying to get more information but in any case, they need a solid plan of what happens when one component of this machine goes down. Also, will they be providing a portable suction unit when Angel Wells leaves the house? He is only on the ventilator at night. Do they have to bring the whole unit with them when they leave the house even if he isn't using the vent? I have the number of the RT and will call and talk to him then call [facility] and let them know what the family would like to do.

## 2021-09-20 ENCOUNTER — Telehealth: Payer: Self-pay | Admitting: Oncology

## 2021-09-20 ENCOUNTER — Other Ambulatory Visit: Payer: Self-pay | Admitting: Gastroenterology

## 2021-09-20 NOTE — Telephone Encounter (Signed)
Called and spoke with Angel Wells  Pt has had 2 days of:   headache  Yellow drainage from nares  Crusty eyes  Low grade fevers  Fatigue (napping more than normal)  They've been using nebs and supplemental oxygen to treat rhonchi and some wheezing  BP was a little high for him this morning at 132/90  Blood sugars have been stable    Angel Wells is not able to bring pt in today, also dad would prefer they dont travel in this weather  Angel Wells is in tomorrow, opted for a video visit to start tomorrow morning with PCP    Encouraged Angel Wells to call the office or on call if anything changes or concerns her

## 2021-09-20 NOTE — Telephone Encounter (Signed)
[facility] TELEPHONE TRIAGE     Reason for call: [name] states patient needed oxygen today due to increased wheezing and labored breathing. [name] relayed concern the patient also has headache, runny nose, and "crust" around eyes. [name] requested to speak to nurse today.     Name of caller: [name] for Angel Wells  Relationship to patient: PDN  Phone: [phone]

## 2021-09-21 ENCOUNTER — Other Ambulatory Visit: Payer: Self-pay | Admitting: Oncology

## 2021-09-21 ENCOUNTER — Ambulatory Visit: Payer: Medicare Other | Attending: Oncology | Admitting: Oncology

## 2021-09-21 DIAGNOSIS — Z9911 Dependence on respirator [ventilator] status: Secondary | ICD-10-CM | POA: Insufficient documentation

## 2021-09-21 DIAGNOSIS — G8250 Quadriplegia, unspecified: Secondary | ICD-10-CM | POA: Insufficient documentation

## 2021-09-21 DIAGNOSIS — M2450 Contracture, unspecified joint: Secondary | ICD-10-CM

## 2021-09-21 DIAGNOSIS — J3489 Other specified disorders of nose and nasal sinuses: Secondary | ICD-10-CM

## 2021-09-21 NOTE — Progress Notes (Signed)
[facility]    TELEMEDICINE VISIT  CHIEF COMPLAINT     No chief complaint on file.      Subjective     TELEMEDICINE CONSENT     Visit being conducted by Video in lieu of a face to face visit to minimize health care worker and patient exposure to COVID-19, and conserve PPE.    Location of Patient: home  Location of Telemedicine Provider: hospital / clinical location  Other Participants in telemedicine encounter and roles: Rosalita Chessman, Blondell Reveal RT    Consent was obtained from the patient to complete this telemedicine visit; including the potential for financial liability.  How did the patient provide consent? Verbal Consent Only    SUBJECTIVE     Here today for an acute visit  Has low grad temp under the arm- which is normal for him  BGs are normal  Has some rhonchi in upper lobes - which is normal for him  Chest xray done yesterday with ultra mobile    Had normal nasal drainage white/sticky/clear   Weekend nurse said he had yellow/green drainage from nose  RN Anda Kraft does not see any abnormal drainage    Received extra albuterol once  Lots of extra saline nebs  Oxygen was up to 4L last night  Now on room air at 96%  Normal amount of wheezing    Anda Kraft RN is not concerned  Does not feel antibiotics are needed       MEDICATIONS     Current Outpatient Medications   Medication Sig    budesonide (PULMICORT) 0.25 mg/70m nebulizer suspension INHALE 2 MLS (0.25 MG TOTAL) VIA NEBULIZER 2 TIMES DAILY FOR ASTHMA    levETIRAcetam (KEPPRA) 100 mg/mL solution give 15 MILLILITER by g-tube twice daily    Insulin Glargine, 2 Unit Dial, (TOUJEO MAX SOLOSTAR) 300 UNIT/ML injection pen Inject 84 units into the skin nightly  for Type 2 Diabetes    guaifenesin (QC MEDIFIN 400) 400 mg TABS Take 1 tablet (400 mg total) by mouth 2 times daily    senna (HM SENNA) 8.6 mg tablet Administer 2 tablets via G tube daily    nystatin (MYCOSTATIN) powder apply topically 4 times daily  to the following areas: neck, groin, axilla     baclofen (LIORESAL) 10 mg tablet take 1 tablet (10 MILLIGRAM total) by mouth 2 times daily    GLUCAGON EMERGENCY 1 MG injection kit Use as directed AS NEEDED for blood glucose < 70    metFORMIN (GLUCOPHAGE) 500 MG/5ML solution take 10 mls (1,000 MILLIGRAM total) by mouth 2 times daily (with meals) for type 2 diabetes    generic DME Item to dispense: Multifunction Vent  Instructions for use: use 24 hours a day    insulin aspart (NOVOLOG FLEXPEN) 100 UNIT/ML injection pen inject sub-cutaneously before meals: nutritional bl: 10 units + one half (1/2) as follows: <70: hold bl, 70-200: bl, > 200: bl+2, >250: bl+4, >300: bl+6, >350: bl +8. alert md for bg <70 and >350.    generic DME Item to dispense: Portable Suction Machine Repair or Replace  Instructions for use: Use As Directed    bisacodyl (DULCOLAX) 10 mg suppository please give suppository every other day, please check with dad prior to giving    calcium carbonate 650 mg tablet Take 1 tablet (650 mg total) by mouth daily  Via G Tube    blood glucose (ACCU-CHEK AVIVA PLUS) test strip USE 1 STRIP THREE TIMES DAILY. DX: E11.65  AND Z79.4    Accu-Chek FastClix Lancets MISC 1 EACH BY MISC.(NON-DRUG COMBO ROUTE) ROUTE 3 (THREE) TIMES DAILY. E11.65    TOBI 300 MG/5ML nebulizer solution Inhale 5 mLs (300 mg total) by nebulization every 12 hours  for pseudomonas infection, trach/vented patient NEBULIZE 1 VIAL (300 MG) TWICE DAILY EVERY OTHER MONTH   DIAGNOSIS: E84.0    carbamide peroxide (DEBROX) 6.5 % otic solution use 5 drops twice daily on the first 3 days of each month.    lacosamide (VIMPAT) 10 mg/mL SOLN oral solution Take 10 MILLILITER per tube twice daily. ; MAXIMUM DAILY DOSE 20 MILLILITER    valproate (VALPROIC ACID) 50 mg/mL syrup take 5 mls (250 milligram total) by mouth 3 times daily    nystatin (MYCOSTATIN) 100,000 Units/mL suspension Swish and swallow 5 mLs (500,000 units total) 4 times daily  for Candidiasis Fungal Infection of the  Oropharynx Swish and hold in mouth before swallowing.    generic DME Item to dispense: 2 Tracheostomies #7 Bivona Silicone Cuffed 80 mm Length  Instructions for use: use as directed    generic DME Item to dispense: hospital bed mattress Instructions for use: use as directed    generic DME Gel Overlay Mattress.  LON: 99, [id number]. Use as directed. G82.50, S06.9x9    ipratropium (ATROVENT) 0.02 % nebulizer solution INHALE 2.5 MLS (500 MCG TOTAL) BY NEBULIZATION 4 TIMES DAILY AS NEEDED FOR WHEEZING J45.4    generic DME Item to dispense: Large Volume Nebulizer Pump 50 psi  Instructions for use: use as directed    B-D UF III MINI PEN NEEDLES 31G X 5 MM USE 3 (THREE) TIMES DAILY AS NEEDED.    generic DME Non-sterile gloves size medium Use as directed. MCAID ID [id number] 2boxes/month    nystatin (MYCOSTATIN) 100000 UNIT/GM cream Apply topically 2 times daily  to the following areas: buttocks    sodium chloride 0.9% 0.9 % irrigation Irrigate with 250 mLs as directed 3 times daily    syringe disposable (BD LUER-LOK) 10 ML Use as instructed for inflation of tracheostomy balloon.    famotidine (PEPCID) 20 mg tablet take 1 tablet by mouth 2 (two) times daily. ok to crush it and put it down tube if necessary    chlorhexidine (PERIDEX) 0.12 % solution use as directed    Nebulizer MISC Disposable nebulizer kits to be used inline with trach  MCAID ID [id number]    LACTULOSE 10 GM/15ML solution administer 30 mls (20 g total) via g tube daily as needed (no stool in 48 hours) use as directed    fluticasone (FLONASE) 50 MCG/ACT nasal spray spray 1 spray into nostril daily    clonazePAM (KLONOPIN) 1 mg tablet TAKE 1 TABLET BY MOUTH TWICE DAILY AS NEEDED (crush for g-tube)    oxygen 1-2 L/min to keep O2 sat above 92%, may increase up to 4 lpm as needed.  Low saturation alarm 85%, high alarm off, low HR 50, High HR 200    albuterol (PROVENTIL) (2.5 mg/43m) 0.083% nebulizer solution Inhale 3 mLs (2.5 mg total) by  nebulization 3 times daily  for Spasm of Lung Air Passages with vesting.  When ill, use four times a day with vesting.    generic DME Item to dispense: Stationary Continuous Oximeter  Instructions for use: Use at night time Dx: Z99.11    generic DME Cough assist machine repairs for: [employer] respironics cough assist model T70.    ibuprofen (ADVIL,MOTRIN) 600 mg tablet Take 1 tablet (600 mg total)  by mouth 4 times daily as needed for Pain for up to 30 doses    generic DME Repair, parts, and labor for coughing assist machine. MCAID CIN: [id number]    cetirizine (ZYRTEC) 10 mg tablet Take 1 tablet (10 mg total) by mouth nightly    Misc. Devices (PULSE OXIMETER) MISC Pulse Oximeter with Supplies, LON: 99, [id number], J96.11, Z99.11    Syringe Disposable 60 ML MISC Use as directed    generic DME Adhesive tape 1" x 5 yards. Use as directed. Disp 3 box    generic DME Suction canister Use as directed.MCAID ID [id number]    generic DME Suction tubing Use as directed.MCAID ID [id number]    suction machine Instructions for use: Use as needed for secretions, LON: 99 months, [id number]    generic DME Pulse ox probe replacement tape. Use as directed.[id number], LON 99    generic DME Pulse ox sensors (disposable)Use as directed.MCAID ID [id number], LON 99    docusate sodium (DOCUSATE SODIUM) 10 mg/mL liquid Take 10 mLs (100 mg total) by mouth 2 times daily (Patient not taking: Reported on 03/30/2021)    generic DME [place] Percussion Vest Model # 105  55inch  Use as directed.    Cholecalciferol 25 MCG (1000 UT) CHEW Take 1,000 units by mouth daily  2 chews daily    Non-System Medication Ok to change Gtube at home by nursing staff every 3 months and PRN    MIC-KEY gastrostomy kit 14FR Please dispense every 3 months and prn, MicKey 14Fr 2.5cm    EPINEPHrine (EPIPEN) 0.3 mg/0.3 mL auto-injector USE AS DIRECTED    lactobacillus rhamnosus, GG, (CULTURELLE) capsule Take 1 capsule (1 each total) by mouth daily    sodium chloride  0.9 % nebulizer solution Inhale 3 mLs by nebulization as needed for Wheezing  MCAID ID [id number]    triamcinolone (KENALOG) 0.025 % cream Apply topically 2 times daily  APPLY TO AFFECTED AREA (Patient not taking: No sig reported)    zinc oxide (DESITIN) 40 % paste Apply topically as needed for Diaper Rash (Patient not taking: Reported on 09/15/2021)    generic DME TRACH TIES WITH O2 PORT. Use as directed.MCAID ID [id number]    MIC-KEY gastrostomy feeding tube extension set 12" Use as directed with gtube    generic DME Adult size trach ties Use as directed.    generic DME Butterfly Harness for use in wheelchair, dispense 1. Use as directed. Dx G80.0, S06.9x9, LON: lifetime    MIC-KEY gastrostomy feeding tube extension set 24" Use as directed, please dispense 4 per month    gauze pads 2"X2" Use as directed around trach. MCAID ID [id number]    Nebulizers (PARI LC PLUS VIOS PRO NEB) MISC Pari Vios Pro Neb or comparable with Nebulizer kits and nebulizer trach mask. Use twice daily With albuterol.MCAID # [id number], MCARE [id number]    generic DME 34m Adapter for trach to be used as directed. Replace weekly. Dx.Z99.11    generic DME 15 mm adapters for nebulized therapies via trach  Medicare ID [id number] Medicaid ID [id number]   nebulizer device with mask and tubing Use as directed    generic DME Please dispense one 589msyringe. Use as directed. (Patient not taking: Reported on 03/30/2021)    generic DME MCAID ID [id number] CUFFED TRACH 7.0 Please change trach every month and as needed.    generic DME Please dispense 3016measuring cup. Use as directed.    Gauze Pads & Dressings (SPLIT GAUZE DRAINAGE  SPONGE) 4"X4" Use once and PRN times a day as instructed.    generic DME 10 isothermal adult "antiflect" connector. MCAID [id number] Use as directed.    generic DME Suction catheter with chimney valve.Use as directed.MCAID ID [id number]    gauze sponge (CURITY) 4"X4" pads Use PRN as instructed.MCAID ID  [id number]    generic DME PASSEY MUIR VALVE (PURPLE) Use as tolerated.MCAID ID [id number]    Adhesive Tape 1"x5yd TAPE By 1 Units no specified route as needed MCAID ID [id number]    generic DME 10 ml syringes Use as directed.MCAID ID [id number]    generic DME Trach care kits Use as directed.MCAID ID [id number]    surgical lubricant (SURGILUBE) gel Apply topically as needed MCAID ID [id number]    Distilled Water LIQD Distilled water, use as directedMCAID ID [id number]    generic DME Mepilex 4X 4. Use as directed.MCAID ID [id number]    generic DME HME for trach humidification Use as directed.MCAID ID [id number]    generic DME Cough Assist circuits Use as directed.MCAID ID [id number]    generic DME Suction filter Use as directed.MCAID ID [id number]    generic DME Nasal adapters Use as directed.MCAID ID [id number]    diphenhydrAMINE (BENADRYL) 25 MG oral solid 25 mg by Per G Tube route nightly as needed for Sleep (Patient not taking: Reported on 03/30/2021)    [place] PO Take by mouth Give 2 gummies daily    Multiple Vitamins-Minerals (HM MULTIVITAMIN ADULT GUMMY PO) Take by mouth Give 2 gummies daily    Ascorbic Acid (VITAMIN C ADULT GUMMIES PO) Take 250 mg by mouth daily  Give 2 gummies once daily     oxygen 2 l/m bleed through ventilator at night    Blood Glucose Monitoring Suppl (FIFTY50 GLUCOSE METER 2.0) w/Device KIT by Other route    miconazole (MICATIN) 2 % powder 2 times daily as needed     insulin pen needle (FIFTY50 PEN NEEDLES) 31G X 8 MM 1 each     Medications reviewed at today's visit       Objective     OBJECTIVE   PHYSICAL EXAM  This visit was performed during a pandemic event, and the physical exam was limited to my Video observation of this patient's organ systems and/or body areas.       Physical Exam  Constitutional:       General: He is not in acute distress.     Appearance: He is not ill-appearing, toxic-appearing or diaphoretic.   Pulmonary:      Effort: Pulmonary effort is normal. No  respiratory distress.   Skin:     Coloration: Skin is not pale.   Neurological:      Mental Status: He is alert.              ASSESSMENT   Today we discussed the assessment and management of the following diagnosis:    ICD-10-CM ICD-9-CM   1. 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 Nasal drainage  All symptoms documented have resolved  Continue to monitor  I will attempt to locate xray ordered on call by provider to make sure no acute findings  Rosalita Chessman will call us if clinical status changes    2. Ventilator dependence  Underlying condition, all treatment decisions made with this in consideration  3. Spastic quadriplegia  Underlying condition, all treatment decisions made with this in consideration            Orders Placed This Encounter   No orders placed during this encounter.       There are no Patient Instructions on file for this visit.           I personally spent 20 minutes, on the calendar day of the encounter, including PreVisit, Visit, and Post visit work, in the care of this patient.      No follow-ups on file.      Phylis Bougie, NP

## 2021-09-21 NOTE — Progress Notes (Signed)
I spoke with [name] and Eunice Blase regarding switching Angel Wells's ventilator to a Vocsn all in one vent. We have all agreed that it is not optimal for Angel Wells. I contacted Mckinley Jewel the account executive at [employer] and let her know they would not be switching vents. I also followed up with her regarding Angel Wells's suction machine that currently is a loaner from [facility]. Not sure why they didn't replace a malfunctioning one, but they supplied a loaner and Marcelino Duster will check it out. I also asked about Angel Wells's cough assist. It has ben broken for about a year and a half. [facility] won't fix it because he was with [facility] when he got it, [employer] won't fix it because he switched to a new company so he is no longer their patient. Marcelino Duster will check into it and I have also email a rep for [employer] to see if there are alternatives to going through the DME company.

## 2021-09-23 ENCOUNTER — Other Ambulatory Visit: Payer: Self-pay | Admitting: Oncology

## 2021-09-23 ENCOUNTER — Telehealth: Payer: Self-pay | Admitting: Oncology

## 2021-09-23 DIAGNOSIS — I771 Stricture of artery: Secondary | ICD-10-CM

## 2021-09-23 DIAGNOSIS — Z9911 Dependence on respirator [ventilator] status: Secondary | ICD-10-CM

## 2021-09-23 DIAGNOSIS — S069XAA Unspecified intracranial injury with loss of consciousness status unknown, initial encounter: Secondary | ICD-10-CM

## 2021-09-23 MED ORDER — GENERIC DME *A*
0 refills | Status: DC
Start: 2021-09-23 — End: 2023-10-10

## 2021-09-23 NOTE — Progress Notes (Signed)
There are many issues with Angel Wells's home respiratory equipment.. His cough assist has been broken for 2 years and neither home care company ([facility]-who dispensed the machine or [facility] who currently has the account and has been delivering supplies) will take responsibility for the machine repair. I have sent a new script to [employer] and they are checking on the possibility of Angel Wells being eligible for a new machine. If they can provide, Angel Wells' family will consider changing companies again to [facility] to hopefully receive better service.

## 2021-09-23 NOTE — Telephone Encounter (Signed)
Spoke with [name]  She confirmed that she was not there when the x ray was done, but she thought [facility] came out to the house on Monday or Tuesday    Agreed with not checking a BMP  Pt has not had any "twitching" lately    Will route to provider    Writer called [facility]  They completed a chest x ray on 3/14  Ordered by Katha Cabal  They will refax results and upload to power share

## 2021-09-23 NOTE — Telephone Encounter (Signed)
I haven't seen it yet-the oncall provider did not order it - and I don't see an order so I'm not even sure how they got an xray.    Unless theres concern for sodium levels I wouldn't get one his last was normal.    Thanks!

## 2021-09-23 NOTE — Telephone Encounter (Signed)
[facility] TELEPHONE INTAKE    Reason for call: [name] is looking for results for chest XRay and states pt is doing fine. Pt is getting home draw for A1C completed next week. [name] is requesting to add Sodium Level lab as well. Please advise    Name of caller: [name]   Relationship to patient: Nurse  Organization (if applicable):   Phone:  [phone]  Fax (if applicable):

## 2021-09-26 NOTE — Telephone Encounter (Signed)
I don't have it in my inbasket and just checked FC it is not there- and don't see it uploaded- if he has no symptoms we don't need to treat.  Thanks!

## 2021-09-26 NOTE — Telephone Encounter (Signed)
Spoke to [place], Therapist, sports.  Relayed recent recommendations from CXR updates.  Angel Wells will reassess patient on Wednesday.  If patient is symptomatic will call office and request repeat CXR.  Feels like he has been doing well though.  No further issues.

## 2021-09-28 NOTE — Telephone Encounter (Signed)
Spoke to Angel Wells this evening, she reports [facility] called and said they are going to be coming to retrieve the suction machine that [facility] got as a loaner when the one from [place] broke and neither company would fix it. They are very frightened of losing the equipment. I have asked Clearence Cheek permission to put [place] in touch with her to see if they could be a better DME company for this family. Anda Kraft is very happy to have the conversation and see if [facility] could meet their needs. Message left for Naoma Diener, account exec and lead RT

## 2021-09-29 ENCOUNTER — Encounter: Payer: Self-pay | Admitting: Gastroenterology

## 2021-09-29 ENCOUNTER — Telehealth: Payer: Self-pay | Admitting: Oncology

## 2021-09-29 ENCOUNTER — Telehealth: Payer: Self-pay

## 2021-09-29 ENCOUNTER — Other Ambulatory Visit: Payer: Self-pay | Admitting: Oncology

## 2021-09-29 DIAGNOSIS — Z930 Tracheostomy status: Secondary | ICD-10-CM

## 2021-09-29 DIAGNOSIS — G8250 Quadriplegia, unspecified: Secondary | ICD-10-CM

## 2021-09-29 DIAGNOSIS — R093 Abnormal sputum: Secondary | ICD-10-CM

## 2021-09-29 DIAGNOSIS — Z9911 Dependence on respirator [ventilator] status: Secondary | ICD-10-CM

## 2021-09-29 MED ORDER — ARGYLE SUCTION CATHETER 14FR MISC *A*
11 refills | Status: DC
Start: 2021-09-29 — End: 2022-06-23

## 2021-09-29 MED ORDER — SUCTION MACHINE DEVI *A*
0 refills | Status: DC
Start: 2021-09-29 — End: 2022-06-23

## 2021-09-29 MED ORDER — Q-CARE COVERD YANKAUER/SUCTION MT MISC
OROMUCOSAL | 11 refills | Status: DC
Start: 2021-09-29 — End: 2022-06-23

## 2021-09-29 NOTE — Telephone Encounter (Signed)
Spoke to account exec Rob Bunting at [facility] regarding broken cough assist. [facility] billed insurance for cough assist in 2020 and is now broken. It is too soon for a new machine and repair of current machine out of pocket is cost prohibitive. Explained to Thayer Ohm that Patsy Lager was paid for the equipment so, in my opinion, should be responsible for upholding warranty. [name] has since changed DME companies and [facility] will not honor the warranty since they did not dispense the equipment. Thayer Ohm is going to speak to the Data processing manager and call me back with result.

## 2021-09-29 NOTE — Telephone Encounter (Signed)
Angel Wells says that they are applying for [employer] transportation services. After the initial application went through, [employer] sent another form requesting information on Adaptive functioning scores and IQ. Angel Wells would like to know if this will require another visit or if this can be filled out without one.     Reviewed application that Angel Wells sent over and see that this would be needed if pt were needing transportation for Mental health related reasons. Angel Wells to resubmit application with focus on medical dx and see if that works. Angel Wells says that she will call [employer] prior to submitting and will call back if anything is needed from Angel Wells.

## 2021-09-29 NOTE — Telephone Encounter (Signed)
[facility] TELEPHONE INTAKE    Reason for call: [name] called and asked if we can give an evaluation for Psych, IQ, and adaption functioning score test. She says they will need it to get transportation. She is rquesting a call back.  Name of caller [name]  Relationship to patient: Nurse  [phone]

## 2021-09-29 NOTE — Progress Notes (Signed)
12 fr. Suction

## 2021-09-30 ENCOUNTER — Other Ambulatory Visit: Payer: Self-pay | Admitting: Oncology

## 2021-09-30 DIAGNOSIS — J4540 Moderate persistent asthma, uncomplicated: Secondary | ICD-10-CM

## 2021-09-30 NOTE — Telephone Encounter (Signed)
CXR is clear with some atlectasis but no PNA.    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 I looked back and it was not mentioned on previous imaging - i'm not sure if it is new or just not commented on prior due to indication for imaging.  I will refer to cardiology for evaluation/monitoring.      Phylis Bougie, NP

## 2021-09-30 NOTE — Telephone Encounter (Signed)
Spoke to [facility] today and they will deliver a new suction machine on Monday

## 2021-09-30 NOTE — Telephone Encounter (Signed)
Paperwork completed 09/30/2021  Sent as outlined in prior documentation

## 2021-09-30 NOTE — Addendum Note (Signed)
Addended by: Homero Fellers A on: 09/30/2021 11:45 AM     Modules accepted: Orders

## 2021-09-30 NOTE — Telephone Encounter (Signed)
Writer spoke to [place], Charity fundraiser and reviewed information received below from Homero Fellers, NP.     [name] requesting copy of report faxed to office for file and review.     Fax # [phone] (Attention: Debbie)    Gary Fleet, RN

## 2021-10-03 ENCOUNTER — Telehealth: Payer: Self-pay

## 2021-10-03 ENCOUNTER — Encounter: Payer: Self-pay | Admitting: Oncology

## 2021-10-03 ENCOUNTER — Telehealth: Payer: Self-pay | Admitting: Oncology

## 2021-10-03 NOTE — Progress Notes (Signed)
Incoming paperwork for completion to the [place]  Date Received: 10/03/2021    Type of paperwork (sender:form type):[facility] detailed written order 3 suction canister  Completed paperwork to be sent to:  Fax to [phone]    Special Requests:     Placed in box for: Charlies Silvers

## 2021-10-03 NOTE — Telephone Encounter (Signed)
3/27  LMOM  [place]  Referral from Fransico Him, NP  NPV - Tortuous aorta

## 2021-10-03 NOTE — Telephone Encounter (Signed)
[place]    Reason for call: Angel Wells called and says dad would like Angel Wells go to his Cardiologist. She says Angel Wells received cardiac results to where he should see a cardiologist.The Name of cardiologist she would like him to go to is [facility]. Phone [phone]   Name of caller:Angel Wells  Relationship to patient: Nurse  Organization (if applicable):   Phone:  [phone]

## 2021-10-03 NOTE — Telephone Encounter (Signed)
After multiple phone calls to multiple people, [employer] has secured another cough assist and will be delivering it to the [facility] this week or early next week.   For background, the serial number of the machine that was sent for repair was run through the data base at the [place] company where it was sent for repair, to check manufacturers warranty and found that the warranty expired in 2015-16. The unit was clearly a returned machine or a refurbished unit. I left a message for Philip Aspen) asking what their policy was regarding these reused units and I received a voicemail back that they had gotten another cough assist unit from another [facility] branch and would be able to replace it. [name] was called an let know.

## 2021-10-04 NOTE — Telephone Encounter (Signed)
Spoke to patients father, he will call back if wants to schedule.

## 2021-10-05 ENCOUNTER — Encounter: Payer: Self-pay | Admitting: Gastroenterology

## 2021-10-06 NOTE — Progress Notes (Signed)
Paperwork completed 10/06/2021  Sent as outlined in prior documentation

## 2021-10-10 ENCOUNTER — Telehealth: Payer: Self-pay | Admitting: Oncology

## 2021-10-10 DIAGNOSIS — I771 Stricture of artery: Secondary | ICD-10-CM

## 2021-10-10 NOTE — Telephone Encounter (Signed)
Received below message on 3/28 regarding Cardiology referral"    Spoke to patient's Father to schedule, he states he will call us back if he wants to schedule.     Please advise on how to proceed

## 2021-10-11 ENCOUNTER — Encounter: Payer: Self-pay | Admitting: Oncology

## 2021-10-11 NOTE — Progress Notes (Signed)
Incoming paperwork for completion to the [place]  Date Received: 10/11/2021    Type of paperwork (sender:form type): [facility]  Completed paperwork to be sent to:  Call for pick up [phone]     Special Requests:     Placed in box for: Charlies Silvers

## 2021-10-11 NOTE — Telephone Encounter (Signed)
Called and spoke to [name]- would like referral to [facility] cardiologist.  Placed order.

## 2021-10-12 NOTE — Progress Notes (Signed)
Paperwork completed 10/12/2021  Sent as outlined in prior documentation

## 2021-10-12 NOTE — Telephone Encounter (Signed)
Writer spoke with patient's Father, Richardson Landry. Per Richardson Landry, he has not been receiving updates regarding new findings and/or treatments plans. He reports that [facility] staff are okay to continue to communicate with Anda Kraft (Patient's Primary Nurse) but he would like to also be notified of new findings, lab work, and treatment plans.     Communication sent to nursing team with summary of conversation.     Fuller [place], RN

## 2021-10-13 ENCOUNTER — Other Ambulatory Visit: Payer: Self-pay

## 2021-10-13 ENCOUNTER — Ambulatory Visit: Payer: Medicare Other | Attending: Oncology | Admitting: Rehabilitative and Restorative Service Providers"

## 2021-10-13 ENCOUNTER — Other Ambulatory Visit
Admission: RE | Admit: 2021-10-13 | Discharge: 2021-10-13 | Disposition: A | Discharge status: Home / Self Care | Payer: Medicare Other | Source: Ambulatory Visit | Attending: Oncology | Admitting: Oncology

## 2021-10-13 ENCOUNTER — Ambulatory Visit: Payer: Medicare Other | Admitting: Rehabilitative and Restorative Service Providers"

## 2021-10-13 ENCOUNTER — Other Ambulatory Visit: Payer: Self-pay | Admitting: Oncology

## 2021-10-13 DIAGNOSIS — Z9911 Dependence on respirator [ventilator] status: Secondary | ICD-10-CM

## 2021-10-13 DIAGNOSIS — G8250 Quadriplegia, unspecified: Secondary | ICD-10-CM | POA: Insufficient documentation

## 2021-10-13 DIAGNOSIS — Z0189 Encounter for other specified special examinations: Secondary | ICD-10-CM | POA: Insufficient documentation

## 2021-10-13 DIAGNOSIS — J4540 Moderate persistent asthma, uncomplicated: Secondary | ICD-10-CM
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
## 2021-10-13 NOTE — Progress Notes (Signed)
Physical Therapy Daily Flowsheet:  *Please see Physical Therapy Exercise Flowsheet for details regarding exercises completed this session.*     10/13/21 1100   Overview   Diagnosis Spastic quadriplegia   Insurance Medicare;Medicaid   Script Date 08/02/21   Visit # 2   Accompanied by nurse [name])   Additional Comments Progress note   Pain Assessment   Pain Denies   Patient Education   Educated in disease process Yes   Educated in proper postural mechanics and management Yes   Time Calculation   PT Timed Codes 30   PT Untimed Codes 0   PT Total Treatment 30     Buck Mam, PT, DPT

## 2021-10-13 NOTE — Progress Notes (Signed)
Physical Therapy Exercise Flowsheet:  *Please refer to Physical Therapy Daily Flowsheet for further details of this session.*     10/13/21 1400   Functional Activities Exercises   Wheelchair To Mat Comment hoyer lift performed x2, 2 assist for positioning   additional exercise assessed seated balance for progress note today, patient requires total assist x2 today to sit unsupported on mat   Total time 30 min     Buck Mam, PT, DPT

## 2021-10-14 ENCOUNTER — Other Ambulatory Visit: Payer: Self-pay | Admitting: Oncology

## 2021-10-14 DIAGNOSIS — J9611 Chronic respiratory failure with hypoxia: Secondary | ICD-10-CM

## 2021-10-14 DIAGNOSIS — Z9911 Dependence on respirator [ventilator] status: Secondary | ICD-10-CM

## 2021-10-14 LAB — HEMOGLOBIN A1C: Hemoglobin A1C: 5.6 %

## 2021-10-14 MED ORDER — GENERIC DME *A*
0 refills | Status: DC
Start: 2021-10-14 — End: 2023-10-10

## 2021-10-14 MED ORDER — GENERIC DME *A*
5 refills | Status: DC
Start: 2021-10-14 — End: 2023-10-10

## 2021-10-14 MED ORDER — GENERIC DME *A*
5 refills | Status: AC
Start: 2021-10-14 — End: ?

## 2021-10-14 MED ORDER — PARI PRONEB MAX NEBULIZER MISC *A*
0 refills | Status: DC
Start: 2021-10-14 — End: 2022-06-23

## 2021-10-14 MED ORDER — GAUZE SPONGE 4"X4" PADS
MEDICATED_PAD | 5 refills | Status: DC
Start: 2021-10-14 — End: 2023-10-10

## 2021-10-14 MED ORDER — OXYGEN CONCENTRATOR DEVI *A*
0 refills | Status: DC
Start: 2021-10-14 — End: 2022-06-23

## 2021-10-14 MED ORDER — GENERIC DME *A*
0 refills | Status: AC
Start: 2021-10-14 — End: ?

## 2021-10-14 NOTE — Progress Notes (Signed)
Items for home care company

## 2021-10-19 ENCOUNTER — Encounter: Payer: Self-pay | Admitting: Oncology

## 2021-10-25 ENCOUNTER — Telehealth: Payer: Self-pay | Admitting: Oncology

## 2021-10-25 NOTE — Telephone Encounter (Signed)
[facility] TELEPHONE INTAKE    Reason for call: Pts nurse states wheezing and fatigue started yesterday. Nurse states pt sputum is yellow. Nurse states when pt was on nebulizer O2 was in the 80s and pt received O2. Pt has no fever. Nurse is requesting budesonide (PULMICORT) 0.25 mg/73mL nebulizer suspension to be increased to from 2 times 3 times daily. Please advise    Name of caller: [name]  Relationship to patient: Nurse  Organization (if applicable):   Phone:  [phone]  Fax (if applicable):

## 2021-10-25 NOTE — Telephone Encounter (Signed)
Called and spoke with [name], RN  States patient has been wheezy all day today  More fatigued than usual   Yellow secretions  O2 in 80's when on neb   Returned to baseline after  No fevers  Normal appetite  No other symptoms    Requesting to increase Pulmicort from 2 times daily to 3 times daily   Plans to increase vesting to 4x daily   Asking about another antihistamine (currently takes zyrtec at night)     Called Dad to review symptoms and treatment plan  Dad disagreed  States patient has allergies and does not think he is sick  Would be interested in adding another antihistamine for allergies  Does not agree with increase in Pulmicort  Advised will discuss with PCP and will call dad back tomorrow    Leonard Schwartz, RN

## 2021-10-26 ENCOUNTER — Other Ambulatory Visit: Payer: Self-pay | Admitting: Pediatrics

## 2021-10-26 NOTE — Telephone Encounter (Signed)
Attempted to call patients dad  No answer  LVM to call clinic back    Leonard Schwartz, RN

## 2021-10-26 NOTE — Telephone Encounter (Addendum)
Nurse gail called and asked if a nurse could call her back. She has updates on josh wheezing and muchus. She is requesting a call back. She says dad just had surgery so that's why he didn't answer yesterday.  Call back is [phone]

## 2021-10-26 NOTE — Telephone Encounter (Signed)
Writer spoke to [place]. [name] requested a return call today. Writer relayed office has attempted to reach out to patients father. [name] states patients father just had surgery and is not awake.

## 2021-10-26 NOTE — Telephone Encounter (Signed)
Called and spoke with Dad  States Angel Wells seems to be doing about the same  Reviewed PCP recommendations  Dad just had procedure- states "I'm a little out of it"  Declined visit for now  Will think about switching antihistamines   Will give the office a call back tomorrow. States [name] will be there in morning and can call with an update as well  Per dad- ok to call and speak with Angel Wells and spoke with [name]  Patient's O2 89% on RA  Having thick yellow secretions  More drowsy  Concerned that patient is getting worse    Discussed with [name] that we cannot make treatment decisions without dad's approval as he is legal guardian  Will have [name] call in the morning with an update  Call oncall with worsening symptoms    Leonard Schwartz, RN

## 2021-10-27 ENCOUNTER — Ambulatory Visit: Payer: Medicare Other | Admitting: Rehabilitative and Restorative Service Providers"

## 2021-10-27 ENCOUNTER — Other Ambulatory Visit: Payer: Self-pay

## 2021-10-27 DIAGNOSIS — G8250 Quadriplegia, unspecified: Secondary | ICD-10-CM

## 2021-10-27 DIAGNOSIS — Z9911 Dependence on respirator [ventilator] status: Secondary | ICD-10-CM

## 2021-10-27 NOTE — Progress Notes (Signed)
Physical Therapy Exercise Flowsheet:  *Please refer to Physical Therapy Daily Flowsheet for further details of this session.*     10/27/21 1700   Functional Activities Exercises   Wheelchair To Mat Comment hoyer lift performed x2, 2 assist for positioning requires increased time today secondary to difficulty positoning in chair due to spasticity.   additional exercise seated balance: anterior/posterior weigth shifts 3x10 reps, lateral weight shifts 3x10 reps patient requires maxAx1 however noted good initiation of movement with his head and neck.   additional exercise Seated balance R UE on mat to help support in midline, requires modAx1-maxAx1 to maintain at midline due to trunk weakness   additional exercise semi reclined reaching to cone (multidirectional) - patient instructed to reach upwards, laterally and above head to tap cone ith R hand completed 3x10 reps   Total time 39 min     Buck Mam, PT, DPT

## 2021-10-27 NOTE — Progress Notes (Signed)
Physical Medicine & Rehabilitation  Occupational Therapy  Daily Note        Name:  Angel Wells  MRN:  [id number]  Diagnosis:    Encounter Diagnoses   Name Primary?   . Spastic quadriplegia Yes   . Ventilator dependence      Today's Date:  10/27/2021    Subjective: Pt presenting to session with nurse present. Reports "he wants to go home"    Pain: 0/10 does not report pain    Intervention:  TREATMENT TIME  DESCRIPTION   Neuromuscular Re-Education 20 min Neuroinhibition:  - review of tone management and prolonged stretch of entire RUE to assist with carryover and understanding  - improvements in wearing schedule and skin hygiene for anti-spasticity splint for LUE  - instructed RN present for proper handling of entire BUE for tone management, reducing gross composite flexion against resistance to improve tone management  - hard end feel present at wrists bilaterally  - instructed RN on possible anti-spasticity splint for R dominant side to wear during non=functional activity     ADL  10 min Feeding:  - trial of various adapted utensils to further assist with independence for feeding aspects  - therapist placed various adapted feeding utensils in R dominant hand (universal cuff, wide handles, curved utensil)  - pt requires hand over hand assist for simulated feeding aspects  - pt observed ability to hold onto yogurt tube, but RN assist for stability of container, may benefit from firmer tubing to assist with positioning   - further participation in self feeding aspects deferred due to fatigue and low arousal          Patient education: Instructed patient in HEP as above, Upgraded current HEP, Discussed/Reviewed treatment plan    Assessment: Reviewed with RN present appropriate tone management strategies and positioning for BUE to promote facilitation of movement and reduce risk of contracture. RN able to provide demo with understanding and teach back. Patient still with significant amount of assist for self care aspects,  requiring hand over hand assist this session for feeding attempts with utensils. Patient may also benefit from anti-spasticity splint to wear during non-functional time periods for R dominant hand for tone reduction. Will follow up with additional feeding strategies next session.     Plan: Continue OT x30 min FUV    Sarita Hakanson, OT  Minutes:  Total time: 30 min     Timed mins: 30 min   Untimed: 0   Unbillable: 0

## 2021-10-27 NOTE — Progress Notes (Signed)
Physical Therapy Daily Flowsheet:  *Please see Physical Therapy Exercise Flowsheet for details regarding exercises completed this session.*     10/27/21 1700   Overview   Diagnosis Spastic quadriplegia   Insurance Medicare;Medicaid   Script Date 08/02/21   Visit # 3   Accompanied by nurse [name])   Additional Comments Patient and nurse continue to work on home exercises daily. Continued to work on seated balance and weight shifts, once in sitting patient requires about mod-maxAx1 to maintain balance at midline, with weight shifts he does initiate using his neck, however requires maxAx1 to perform anterior/posterior and lateral weight shifts due to poor trunk control. Semireclined to sit today requires total assist x2 patient does try to initiate with neck/head however lacks trunk strength to complete the sit up.   Pain Assessment   Pain Denies   Patient Education   Educated in disease process Yes   Educated in proper postural mechanics and management Yes   Time Calculation   PT Timed Codes 39   PT Untimed Codes 0   PT Total Treatment [address], PT, DPT

## 2021-10-28 ENCOUNTER — Other Ambulatory Visit: Payer: Self-pay | Admitting: Oncology

## 2021-10-28 DIAGNOSIS — J4540 Moderate persistent asthma, uncomplicated: Secondary | ICD-10-CM

## 2021-10-31 ENCOUNTER — Encounter: Payer: Self-pay | Admitting: Gastroenterology

## 2021-10-31 ENCOUNTER — Encounter: Payer: Self-pay | Admitting: Oncology

## 2021-10-31 NOTE — Progress Notes (Signed)
Incoming paperwork for [facility]  Date Received: 10/31/2021    Type of paperwork:   . Sender/organization: [facility] [facility] [place]  . Form type: Home Health Certificate and Plan of Care    Copy and scan before mailing? No    Completed paperwork to be sent to:  Fax to [phone]    Placed in box for: Sharmon Revere  11:44 AM  10/31/2021

## 2021-11-02 ENCOUNTER — Telehealth: Payer: Self-pay | Admitting: Oncology

## 2021-11-02 NOTE — Telephone Encounter (Addendum)
[facility] TELEPHONE TRIAGE     Reason for call: [name] requested to receive a verbal order from nurse or provider so the patients night PDN receives direction that the patient is to continue wearing wrist splint for a duration of two hours every night. [name] states night RN will not have patient wear wrist splint unless there is an official order directing nurse to do so.     Name of caller: [name] for Angel Wells  Relationship to patient: PDN  Phone:  [phone]

## 2021-11-03 ENCOUNTER — Other Ambulatory Visit: Payer: Self-pay | Admitting: Oncology

## 2021-11-03 ENCOUNTER — Other Ambulatory Visit: Payer: Self-pay | Admitting: Pediatrics

## 2021-11-03 NOTE — Telephone Encounter (Signed)
Writer spoke to [place]. [name] inquired if a letter was completed regarding the patient being able to attend a party this weekend. [name] states she provided nursing a hand written letter when patient was taken to an appointment this past week.   [phone]

## 2021-11-03 NOTE — Telephone Encounter (Signed)
Pts nurse requesting an update on orders. Please advise

## 2021-11-04 ENCOUNTER — Other Ambulatory Visit: Payer: Self-pay | Admitting: Oncology

## 2021-11-04 DIAGNOSIS — J4540 Moderate persistent asthma, uncomplicated: Secondary | ICD-10-CM

## 2021-11-04 MED ORDER — IPRATROPIUM BROMIDE 0.02 % IN SOLN *I*
RESPIRATORY_TRACT | 5 refills | Status: DC
Start: 2021-11-04 — End: 2021-11-21

## 2021-11-04 NOTE — Progress Notes (Signed)
Paperwork completed 11/04/2021  Sent as outlined in prior documentation

## 2021-11-04 NOTE — Telephone Encounter (Signed)
Going to PT and OT would like pt to wear wrist brace for additional 2 hours at night. Writer sees that pt has been working to increase length of time in braces. Gave verbal to include 2 hours at HS.     [name] says that she would like a letter sent to [facility] for [name] to become a member. 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 [name] says that this will help [name] to participate in activities for this group.     Says that father is ok with pt pursuing this membership.  Letter can be sent to    [address]   [place] [address]  Attn Thayer Ohm hilderbrand

## 2021-11-04 NOTE — Telephone Encounter (Signed)
Writer spoke to [place]. Writer confirmed request for verbal order has been received and [name] will be contacted as soon as a Engineer, civil (consulting) is available.

## 2021-11-08 ENCOUNTER — Telehealth: Payer: Self-pay | Admitting: Oncology

## 2021-11-08 NOTE — Telephone Encounter (Signed)
[facility] TELEPHONE TRIAGE     Reason for call: Angel Wells requests to receive letter from PCP addressed to the [facility]. 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 Angel Wells relayed the patient would receive many benefits participating in this group. Angel Wells requests completed letter is sent to [email or web address]     Name of caller: Angel Wells for Angel Wells  Relationship to patient: PDN   Phone:  [phone]

## 2021-11-08 NOTE — Telephone Encounter (Signed)
Spoke with father he says that he is ok with pt becoming a member. Letter generated and sent to Address as requested in below message.

## 2021-11-08 NOTE — Telephone Encounter (Signed)
This concern is being addressed in a previous task for same concern.

## 2021-11-09 ENCOUNTER — Ambulatory Visit: Payer: Medicare Other | Attending: Neurology | Admitting: Neurology

## 2021-11-09 ENCOUNTER — Other Ambulatory Visit: Payer: Self-pay

## 2021-11-09 ENCOUNTER — Encounter: Payer: Self-pay | Admitting: Gastroenterology

## 2021-11-09 DIAGNOSIS — G8250 Quadriplegia, unspecified: Secondary | ICD-10-CM | POA: Insufficient documentation

## 2021-11-09 DIAGNOSIS — G40219 Localization-related (focal) (partial) symptomatic epilepsy and epileptic syndromes with complex partial seizures, intractable, without status epilepticus: Secondary | ICD-10-CM | POA: Insufficient documentation

## 2021-11-09 NOTE — Patient Instructions (Signed)
There is no clear indication for increasing his baclofen.    It would be reasonable to speak to the nurses who have raised concern about his spasticity to learn what they have observed, what their specific concerns are, and how they think that adjusting his baclofen dose would benefit him.  Unless he is experiencing discomfort or he has spasticity that limits his ability to receive care or participate in care, I don't recommend adjusting his baclofen.   Please contact Dr. Latanya Presser if his parents feel comfortable increasing his dose of lacosamide to 150 mg twice daily with the goal of reducing his seizure frequency further.  If that increase was made and [name] experienced any adverse effects, his dose could be cut back down to 100 mg twice daily.

## 2021-11-09 NOTE — Progress Notes (Signed)
I saw Angel Wells in the General Neurology Clinic at [facility] on 11/09/2021 for follow-up evaluation of his intractable seizure disorder.  [name], his primary nurse, accompanied him today. I last saw him on 11/10/2020.  I transcribed the history of present illness below during my discussion with Angel Wells and [name], though it is not written verbatim.    HISTORY OF PRESENT ILLNESS:  He told me that things are pretty good.  He wants to go home.  [name] told me that she has been with him 3 1/2 years full-time, and she has noticed that he seems to have seizures when something is irritating him or there is some physical stressor.  She wonders if interactions with certain personnel provoke seizures.  He is having a bowel movement every day with an aggressive bowel regimen. He has had only one infection in a year; he used to get as many as ten infections a year.  Some of the nurses involved in his care wonder if he is having a little more spasticity, in particular during the night.  They wonder if the baclofen could be increased.  It is unclear what the specific concern is about spasticity or what is observed during the night that raised concern for spasticity.    EXAMINATION:  No formal examination was performed.       LABORATORY DATA:   ***     IMAGING DATA:   ***     IMPRESSION/DISCUSSION:   ***    PLAN:   ***    Azucena Kuba, MD  Assoc. Professor of Clinical Neurology  Pager: [phone]  Office phone: [phone]  Office fax: [phone]  11/09/2021 1:37 PM

## 2021-11-10 ENCOUNTER — Ambulatory Visit: Payer: Medicare Other | Admitting: Rehabilitative and Restorative Service Providers"

## 2021-11-10 ENCOUNTER — Ambulatory Visit: Payer: Medicare Other | Attending: Oncology | Admitting: Rehabilitative and Restorative Service Providers"

## 2021-11-10 ENCOUNTER — Encounter: Payer: Self-pay | Admitting: Neurology

## 2021-11-10 DIAGNOSIS — Z9911 Dependence on respirator [ventilator] status: Secondary | ICD-10-CM | POA: Insufficient documentation

## 2021-11-10 DIAGNOSIS — G8250 Quadriplegia, unspecified: Secondary | ICD-10-CM | POA: Insufficient documentation
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
## 2021-11-10 NOTE — Progress Notes (Signed)
Physical Therapy Exercise Flowsheet:  *Please refer to Physical Therapy Daily Flowsheet for further details of this session.*     11/10/21 1500   Functional Activities Exercises   Wheelchair To Mat Comment hoyer lift performed x2, 2 assist for positioning requires increased time today secondary to difficulty positoning in chair due to spasticity.   additional exercise seated balance: anterior/posterior weigtht shifts 3x10 reps, lateral weight shifts 3x10 reps patient requires maxAx1 however noted good initiation of movement with his head and neck.   additional exercise Seated balance R UE on mat to help support in midline, requires modAx1-maxAx1 to maintain at midline due to trunk weakness, scapular retractions 2x10 reps   additional exercise semireclined bicep curl on R 2x10 reps   additional exercise reassessed for discharge note, see additional. Provided patient and caregiver with list of community resources for TBI   Total time 30 min     Buck Mam, PT, DPT

## 2021-11-10 NOTE — Progress Notes (Signed)
Physical Medicine & Rehabilitation  Occupational Therapy  Progress Note        Name:  Angel Wells  MRN:  [id number]  Diagnosis:    Encounter Diagnoses   Name Primary?   . Spastic quadriplegia Yes   . Ventilator dependence      Today's Date:  11/10/2021    Subjective: Pt aide presenting to session. Pt reports "I want to go home"    Pain: does not report    Intervention:  TREATMENT TIME  DESCRIPTION   Neuromuscular Re-Education 30 min Reassessment of patient's progress towards functional goals     Briefly discussed using built up handles and/or universal cuff for feeding, facility will trial and follow up at next appointment        Performance Deficits    ADL Status:  Impaired, dependent for all aspects with exception of being able to bring some foods to mouth using right hand    Instrumental ADL/Community status: Impaired, dependent for all aspects    Transfers: dependent, wheelchair bound. Hoyer sling for transfers    Vision:  Unable to accurately assess due to cognition. Appears within normal limits  Corrective lenses:   Eye disease:  Childhood eye disease:       Cognition: History of traumatic brain injury with resulting cognitive impairments. Formal cognitive testing not completed at this time. Pt unable to recall the current month but was able to recall his birthday correctly.       Upper Extremity Assessment    Range of Motion: (unnoted implies WFL)  Shoulder    UE AROM (PROM) AROM (PROM)    Right Left   Scapula:     Protraction     Retraction trace trace   Elevation trace trace   Depression     Shoulder:     Extension/Flexion 45(100) Trace(106)   Abduction 65(90) 40(86)  Improved PROM   Adduction     Horizontal Abduction     Horizontal Adduction     External Rotation/Internal Rotation (40)/rests in IR (23)/rests in IR  reduced   , Elbow    UE  AROM (PROM)  AROM (PROM)     Right  Left    Elbow Extension/Flexion Full/full trace/80(full)  reduced   Supination/Pronation neutral(30) 60(full)     and Wrist    UE  AROM (PROM)   AROM (PROM)    Right  Left    Wrist Extension/Flexion neutral/(-10) (-30)/(full)  reduced   Radial/Ulnar Deviation              Strength: Manual Muscle Testing scores __/5 (unnoted implies [facility])   Measured          Motion Right Left   Scapular elevation     Shoulder flexion 2+ 1   Shoulder abduction 2+ 2+   Internal rotation     External rotation 1 1   Elbow flexion 3+ 2+/3-   Elbow extension 3 3-   Supination 3- 3-   Pronation     Wrist flexion     Wrist extension 1 1     Digits rest bilaterally in flexed position  Left wrist presenting in flexed position with firm end feel  Pt unable to use hands for fine motor aspects  Full composite flexion, unable to perform functional release    Grip/Pinch Assessment: unable to assess due to tone      Tone: MAS:     Right Bicep: 1+  Right Wrist Flexors: 3, clonus  Right Digit Flexors:  1+, clonus    Left Bicep: 1  Left Wrist Flexors: 2+, clonus  Left Digit Flexors: 3, clonus    Goals:   In 1-2 sessions:  - [name] caregiver will demonstrate understanding of splint wearing schedule and hygiene/skin checks to reduce hypertonicity of left UE Continue, Hasn't been able to tolerate full time period  - [name] caregiver will demonstrate understanding of stretching and neuroinhibitive strategies to prevent hypertonicity in bilateral UE Progress Made  - [name] caregivers will demonstrate understanding of various adapted equipment and devices to allow for improved independence with video gaming Recommended adapted game equipment  - [name] caregiver will demonstrate understanding of adapted techniques for feeding including built up handles or utensils for ability to bring utensil to mouth independently Progress Made, Follow up in next session  In 3-4 sessions:  - Pt will demonstrate ability to incorporate adapted utensils to perform feeding aspects with mod I Not Met, Continue   - Pt will demonstrate ability to tolerate splint for tone  management of LUE for at least 8 hours during the day Not Met, Hasn't been able to tolerate full amount  - Pt will demonstrate ability to participate in desired hobbies (gaming) using adapted controller with mod I Continue, Hasn't tried recommendations    Patient education: Instructed patient in HEP as above, Discussed progress towards goals, Discussed/Reviewed treatment plan    Assessment: Patient is demonstrating relatively inconsistent progress towards functional goals. Overall spasticity has remained relatively the same, but caregivers have been attempting to progress with wearing schedule for splint and stretching program. Patient still not able to wear splint for long periods, discussed the benefits of prolonged stretch and avoidance of resistive activity for grip/pinch. Due to behavioral challenges, patient has difficulties with full participation in session. Still requiring significant assist for simple UE aspects such as eating. Will trial recommended strategies in upcoming session. Patient may also benefit from anti-spasticity splint for RUE for tone management in wrist. Discussed that follow up visit will most likely be last OT session. Patient is close to his functional baseline and maximum rehab potential.     Plan: Continue OT, follow up for 30 min    Angel Wells, OT  Minutes:  Total time: 30     Timed mins: 30   Untimed: 0   Unbillable: 0

## 2021-11-10 NOTE — Progress Notes (Signed)
Physical Therapy Daily Flowsheet:  *Please see Physical Therapy Exercise Flowsheet for details regarding exercises completed this session.*     11/10/21 1400   Overview   Diagnosis Spastic quadriplegia   Insurance Medicare;Medicaid   Script Date 08/02/21   Visit # 4   Accompanied by nurse [name])   Additional Comments Discharge note   Pain Assessment   Pain Denies   Patient Education   Educated in disease process Yes   Educated in home exercise program Yes   Educated in proper postural mechanics and management Yes   Additional Patient Education HEP: discussed seated balance on edge of bed, would require maxAx2, can attempt seated balance/weight shifts in chair (anterior/posterior, lateral weight shifts) with verbal cues to initiate with head.   Time Calculation   PT Timed Codes 30   PT Untimed Codes 0   PT Total Treatment 30     Buck Mam, PT, DPT

## 2021-11-11 ENCOUNTER — Telehealth: Payer: Self-pay | Admitting: Oncology

## 2021-11-11 ENCOUNTER — Other Ambulatory Visit
Admission: RE | Admit: 2021-11-11 | Discharge: 2021-11-11 | Disposition: A | Discharge status: Home / Self Care | Payer: Medicare Other | Source: Ambulatory Visit | Attending: Neurology | Admitting: Neurology

## 2021-11-11 DIAGNOSIS — G40219 Localization-related (focal) (partial) symptomatic epilepsy and epileptic syndromes with complex partial seizures, intractable, without status epilepticus: Secondary | ICD-10-CM | POA: Insufficient documentation

## 2021-11-11 LAB — CBC AND DIFFERENTIAL
Baso # K/uL: 0 10*3/uL (ref 0.0–0.1)
Basophil %: 0.5 %
Eos # K/uL: 0.1 10*3/uL (ref 0.0–0.5)
Eosinophil %: 0.6 %
Hematocrit: 35 % — ABNORMAL LOW (ref 40–51)
Hemoglobin: 11.9 g/dL — ABNORMAL LOW (ref 13.7–17.5)
IMM Granulocytes #: 0.1 10*3/uL — ABNORMAL HIGH (ref 0.0–0.0)
IMM Granulocytes: 0.7 %
Lymph # K/uL: 2.2 10*3/uL (ref 1.3–3.6)
Lymphocyte %: 25.6 %
MCH: 29 pg (ref 26–32)
MCHC: 34 g/dL (ref 32–37)
MCV: 87 fL (ref 79–92)
Mono # K/uL: 0.4 10*3/uL (ref 0.3–0.8)
Monocyte %: 4.9 %
Neut # K/uL: 5.7 10*3/uL — ABNORMAL HIGH (ref 1.8–5.4)
Nucl RBC # K/uL: 0 10*3/uL (ref 0.0–0.0)
Nucl RBC %: 0 /100 WBC (ref 0.0–0.2)
Platelets: 255 10*3/uL (ref 150–330)
RBC: 4.1 MIL/uL — ABNORMAL LOW (ref 4.6–6.1)
RDW: 13.2 % (ref 11.6–14.4)
Seg Neut %: 67.7 %
WBC: 8.4 10*3/uL (ref 4.2–9.1)

## 2021-11-11 LAB — COMPREHENSIVE METABOLIC PANEL
ALT: 45 U/L (ref 0–50)
AST: 18 U/L (ref 0–50)
Albumin: 4.6 g/dL (ref 3.5–5.2)
Alk Phos: 61 U/L (ref 40–130)
Anion Gap: 15 (ref 7–16)
Bilirubin,Total: 0.4 mg/dL (ref 0.0–1.2)
CO2: 25 mmol/L (ref 20–28)
Calcium: 10.1 mg/dL (ref 9.0–10.3)
Chloride: 90 mmol/L — ABNORMAL LOW (ref 96–108)
Creatinine: 0.31 mg/dL — ABNORMAL LOW (ref 0.67–1.17)
Glucose: 96 mg/dL (ref 60–99)
Lab: 7 mg/dL (ref 6–20)
Potassium: 4.4 mmol/L (ref 3.3–5.1)
Sodium: 130 mmol/L — ABNORMAL LOW (ref 133–145)
Total Protein: 8 g/dL — ABNORMAL HIGH (ref 6.3–7.7)
eGFR BY CREAT: 163 *

## 2021-11-11 LAB — GLUC COMMENT

## 2021-11-11 NOTE — Telephone Encounter (Signed)
Called and spoke with Dad  Confirmed that this is ok and agrees with plan  Discussed with PCP  Ok to give verbal  Verbal given to [name]  No other needs at this time    Leonard Schwartz, RN

## 2021-11-11 NOTE — Telephone Encounter (Signed)
[facility] TELEPHONE TRIAGE     Reason for call: [name] requested to make adjustments to paitents splint order. [name] requested to receive verbal approval to changes to splint order. [name] requests new order states patient must "increase wearing splint during day by one hour a week, up to 8 hours a day, as tolerated."     Name of caller: [name] for Hugo Niedzwiecki  Relationship to patient: PDN  Phone:  [phone]

## 2021-11-14 ENCOUNTER — Telehealth: Payer: Self-pay | Admitting: Rehabilitative and Restorative Service Providers"

## 2021-11-14 ENCOUNTER — Other Ambulatory Visit: Payer: Self-pay | Admitting: Oncology

## 2021-11-14 DIAGNOSIS — E1165 Type 2 diabetes mellitus with hyperglycemia: Secondary | ICD-10-CM

## 2021-11-14 LAB — VALPROIC ACID, TOTAL AND FREE
Valproic Acid, Free: 8 ug/mL (ref 5–25)
Valproic Acid,% Free: 13
Valproic Acid: 61 ug/mL (ref 50–100)

## 2021-11-14 NOTE — Telephone Encounter (Signed)
Received a note to return phone call from nurse manager [name] in regards to patient. [name] stated that she had videos to show of [name] participating in eating tasks, and wanted to see if she could send the videos along for suggestions and modifications. Patient has an upcoming appointment within the next few weeks for follow up and carryover of additional recommendations.     Taylynn Easton, OT  11/14/2021

## 2021-11-15 ENCOUNTER — Encounter: Payer: Self-pay | Admitting: Oncology

## 2021-11-15 NOTE — Progress Notes (Signed)
Incoming paperwork for [facility] to the [facility]  Date Received: 11/15/2021    Type of paperwork (sender:form type): [employer] Practitioner Standard Written Order Fixed Back Arm Receiver R[employer] and [employer]air    Completed paperwork to be sent to:  Fax to [phone]    Special Requests:     Placed in box for: [name]

## 2021-11-15 NOTE — Progress Notes (Signed)
Incoming paperwork for completion to the [facility]  Date Received: 11/15/2021    Type of paperwork (sender:form type): [employer] Wrist Splint    Completed paperwork to be sent to:  Fax to [phone]    Special Requests:     Placed in box for: [name]

## 2021-11-16 ENCOUNTER — Encounter: Payer: Self-pay | Admitting: Gastroenterology

## 2021-11-16 NOTE — Progress Notes (Signed)
Outgoing paperwork:    Completed paperwork received on  11/16/2021  Sent to scanning? Yes  Sent as outlined in prior documentation: Fax to [phone]    Sherron Ales  3:59 PM  11/16/2021

## 2021-11-17 NOTE — Progress Notes (Signed)
Outgoing paperwork:    Completed paperwork received on  11/17/2021  Sent to scanning? Yes  Sent as outlined in prior documentation: Fax to [phone]    Sherron Ales  8:38 AM  11/17/2021

## 2021-11-19 ENCOUNTER — Other Ambulatory Visit: Payer: Self-pay | Admitting: Oncology

## 2021-11-19 DIAGNOSIS — J4540 Moderate persistent asthma, uncomplicated: Secondary | ICD-10-CM

## 2021-11-24 ENCOUNTER — Other Ambulatory Visit: Payer: Self-pay | Admitting: Oncology

## 2021-12-01 ENCOUNTER — Telehealth: Payer: Self-pay | Admitting: Oncology

## 2021-12-01 DIAGNOSIS — J398 Other specified diseases of upper respiratory tract: Secondary | ICD-10-CM

## 2021-12-01 DIAGNOSIS — J45909 Unspecified asthma, uncomplicated: Secondary | ICD-10-CM

## 2021-12-01 MED ORDER — ALBUTEROL SULFATE (2.5 MG/3ML) 0.083% IN NEBU *I*
2.5000 mg | INHALATION_SOLUTION | Freq: Three times a day (TID) | RESPIRATORY_TRACT | 3 refills | Status: DC
Start: 2021-12-01 — End: 2022-03-21

## 2021-12-01 NOTE — Telephone Encounter (Signed)
Pt scheduled in clinic tomorrow afternoon

## 2021-12-01 NOTE — Telephone Encounter (Signed)
Hello! I would recommend a visit for vitals/ assessment - the last several xrays show atelectasis vs pna so its hard to know without assessment.  It could be viral which would need a swab  I can put in for a mobile xray- but its hard to determine if that's appropriate without a visit - and want him assessed prior to starting treatment regardless

## 2021-12-01 NOTE — Telephone Encounter (Signed)
Called and spoke with pts father  Relayed that he is having similar concerns, especially going into a holiday weekend  Low grade fevers, saturation is lower than baseline, increased secretions, BG's have been stable  Dad wondering about a sputum sample and mobile xray?  Will route to provider  Dad is ok with office following up with [name]

## 2021-12-01 NOTE — Telephone Encounter (Signed)
[facility] TELEPHONE INTAKE    Reason for call: [name] called to give nurse/PCP head up on patient. Light fever of 99 yesterday. Patient has a little more secretions since Tuesday. Patient has hay fever but may be becoming a little bit more. Stats high 80's at night on the vent, keeping at 2 liters. During the day when off vent 88-93 with room air. [name] states not sure if something is brewing. [name] will be out of town for the weekend and wanted to give staff a heads up.    Name of caller: [name]  Relationship to patient: RN    Phone:  [phone]

## 2021-12-02 ENCOUNTER — Other Ambulatory Visit: Payer: Self-pay | Admitting: Gastroenterology

## 2021-12-02 ENCOUNTER — Ambulatory Visit
Payer: Medicare Other | Attending: Student in an Organized Health Care Education/Training Program | Admitting: Student in an Organized Health Care Education/Training Program

## 2021-12-02 ENCOUNTER — Other Ambulatory Visit: Payer: Self-pay

## 2021-12-02 VITALS — BP 118/65 | HR 100 | Temp 98.4°F | Wt 181.7 lb

## 2021-12-02 DIAGNOSIS — R093 Abnormal sputum: Secondary | ICD-10-CM | POA: Insufficient documentation

## 2021-12-02 DIAGNOSIS — Z20828 Contact with and (suspected) exposure to other viral communicable diseases: Secondary | ICD-10-CM | POA: Insufficient documentation

## 2021-12-02 DIAGNOSIS — Z9911 Dependence on respirator [ventilator] status: Secondary | ICD-10-CM | POA: Insufficient documentation

## 2021-12-02 DIAGNOSIS — Z20822 Contact with and (suspected) exposure to covid-19: Secondary | ICD-10-CM | POA: Insufficient documentation

## 2021-12-02 LAB — AEROBIC CULTURE

## 2021-12-02 LAB — GRAM STAIN

## 2021-12-02 LAB — MRSA NAAT (PCR): MRSA NAAT (PCR): POSITIVE — AB

## 2021-12-02 MED ORDER — GENERIC DME *A*
0 refills | Status: DC
Start: 2021-12-02 — End: 2023-10-10

## 2021-12-02 MED ORDER — LEVOFLOXACIN 500 MG PO TABS *I*
500.0000 mg | ORAL_TABLET | Freq: Every day | ORAL | 0 refills | Status: AC
Start: 2021-12-02 — End: 2021-12-16

## 2021-12-02 NOTE — Progress Notes (Unsigned)
[facility]    Visit Note     REASON FOR VISIT      Chief Complaint   Patient presents with   . Follow-up     {Telemedicine Consent (Optional):41261}    PRIMARY DIAGNOSIS      ***    Subjective     SUBJECTIVE      ***  Saturations are running low. Normal for him to drop down to the 80s when getting breathing and vest. Has been using extra oxygen on 3L at night up from 1L at night which is his baseline. Secretions normallyt are clear/ yellow. Now green sputum. Also been having low grade fever 99.42F. Takes all temps underneath arm. No diarrhea or vomiting. Has chronic constipation, last BM was soft. Yesterday was very uncomfortable. Yesterday was irritable and fussing. Eyes have been puffy and blood shot.    Resp tretaments:  - Normally gets Albuterol/ ipratropium tid, budesonide twice daily. Tobramycin twice daily.  Prn albuterol: getting this 3 extra doses 3 days ago  Prn saline: getting 3 saline doses for the last 4 days  Does vesting 3 times per day.     No LE swelling    Allergiuc        ROS  Review of Systems as per HPI above    Past Medical History, Social History, Family History, and Medications/allergies reviewed during this visit    Current Outpatient Medications   Medication   . albuterol (PROVENTIL) (2.5 mg/72mL) 0.083% nebulizer solution   . TOUJEO MAX SOLOSTAR 300 UNIT/ML injection pen   . ipratropium (ATROVENT) 0.02 % nebulizer solution   . B-D UF III MINI PEN NEEDLES 31G X 5 MM   . SILACE 150 MG/15ML syrup   . bisacodyl (DULCOLAX) 10 mg suppository   . budesonide (PULMICORT) 0.25 mg/5mL nebulizer suspension   . metFORMIN (GLUCOPHAGE) 500 MG/5ML solution   . generic DME   . oxygen concentrator   . generic DME   . Nebulizers (PARI PRONEB MAX NEBULIZER) device   . generic DME   . generic DME   . Gauze Pads & Dressings (SPLIT GAUZE DRAINAGE SPONGE) 4"X4"   . generic DME   . generic DME   . generic DME   . generic DME   . suction catheter 14 Fr (ARGYLE)   . suction machine   . Oral  Hygiene Products (Q-CARE COVERD YANKAUER/SUCTION) MISC   . generic DME   . baclofen (LIORESAL) 10 mg tablet   . levETIRAcetam (KEPPRA) 100 mg/mL solution   . guaifenesin (QC MEDIFIN 400) 400 mg TABS   . senna (HM SENNA) 8.6 mg tablet   . nystatin (MYCOSTATIN) powder   . GLUCAGON EMERGENCY 1 MG injection kit   . generic DME   . insulin aspart (NOVOLOG FLEXPEN) 100 UNIT/ML injection pen   . generic DME   . calcium carbonate 650 mg tablet   . blood glucose (ACCU-CHEK AVIVA PLUS) test strip   . Accu-Chek FastClix Lancets MISC   . TOBI 300 MG/5ML nebulizer solution   . carbamide peroxide (DEBROX) 6.5 % otic solution   . lacosamide (VIMPAT) 10 mg/mL SOLN oral solution   . valproate (VALPROIC ACID) 50 mg/mL syrup   . nystatin (MYCOSTATIN) 100,000 Units/mL suspension   . generic DME   . generic DME   . generic DME   . generic DME   . generic DME   . nystatin (MYCOSTATIN) 100000 UNIT/GM cream   . sodium chloride 0.9% 0.9 %  irrigation   . syringe disposable (BD LUER-LOK) 10 ML   . famotidine (PEPCID) 20 mg tablet   . chlorhexidine (PERIDEX) 0.12 % solution   . Nebulizer MISC   . LACTULOSE 10 GM/15ML solution   . fluticasone (FLONASE) 50 MCG/ACT nasal spray   . clonazePAM (KLONOPIN) 1 mg tablet   . oxygen   . generic DME   . generic DME   . ibuprofen (ADVIL,MOTRIN) 600 mg tablet   . generic DME   . cetirizine (ZYRTEC) 10 mg tablet   . Misc. Devices (PULSE OXIMETER) MISC   . Syringe Disposable 60 ML MISC   . generic DME   . generic DME   . generic DME   . suction machine   . generic DME   . generic DME   . generic DME   . Cholecalciferol 25 MCG (1000 UT) CHEW   . Non-System Medication   . MIC-KEY gastrostomy kit 14FR   . EPINEPHrine (EPIPEN) 0.3 mg/0.3 mL auto-injector   . lactobacillus rhamnosus, GG, (CULTURELLE) capsule   . sodium chloride 0.9 % nebulizer solution   . generic DME   . MIC-KEY gastrostomy feeding tube extension set 12"   . generic DME   . generic DME   . MIC-KEY gastrostomy feeding tube extension set 24"   .  gauze pads 2"X2"   . Nebulizers (PARI LC PLUS VIOS PRO NEB) MISC   . generic DME   . generic DME   . nebulizer device with mask and tubing   . generic DME   . generic DME   . Gauze Pads & Dressings (SPLIT GAUZE DRAINAGE SPONGE) 4"X4"   . generic DME   . generic DME   . gauze sponge (CURITY) 4"X4" pads   . generic DME   . Adhesive Tape 1"x5yd TAPE   . generic DME   . generic DME   . surgical lubricant (SURGILUBE) gel   . Distilled Water LIQD   . generic DME   . generic DME   . generic DME   . generic DME   . generic DME   . FIBER SELECT GUMMIES PO   . Multiple Vitamins-Minerals (HM MULTIVITAMIN ADULT GUMMY PO)   . Ascorbic Acid (VITAMIN C ADULT GUMMIES PO)   . oxygen   . Blood Glucose Monitoring Suppl (FIFTY50 GLUCOSE METER 2.0) w/Device KIT   . miconazole (MICATIN) 2 % powder   . insulin pen needle (FIFTY50 PEN NEEDLES) 31G X 8 MM   . generic DME   . triamcinolone (KENALOG) 0.025 % cream   . zinc oxide (DESITIN) 40 % paste   . generic DME   . diphenhydrAMINE (BENADRYL) 25 MG oral solid     No current facility-administered medications for this visit.          Objective     OBJECTIVE      BP 118/65   Pulse 100   Temp 36.9 C (98.4 F) (Temporal)   Wt 82.4 kg (181 lb 11.2 oz)   SpO2 94%   BMI 30.24 kg/m     Physical Exam  {Telemed Exam (Optional):41265}         ASSESSMENT / DIAGNOSIS     1. Ventilator dependence  ***  - generic DME; Low rate alarms set at 4 breaths, high rate alarm set at 40 breaths, pressure support set at 13 cms  Dispense: 1 each; Refill: 0      Orders Placed This Encounter   . generic DME  Patient Instructions   Cough assist settings as follows:  '[name]'s setings"   insp +35, 2.5 seconds, exhaled pressure -40, 2.5 seconds    for 5 cycles and may stop or repeat if needed for a maximum of 5 sets of 5 breaths    Pause is 2 seconds with 5 cms pressure             --Patient instructed to call if symptoms are not improving or worsening  --Follow-up arranged  No follow-ups on file.     I  personally spent *** minutes on the calendar day of the encounter, including pre and post visit work reviewing the EMR and management of this patient.     Electronically signed by August Luz, MD , 12/02/2021,@   [facility], Phone: [phone]

## 2021-12-02 NOTE — Patient Instructions (Addendum)
Cough assist settings as follows:  '[name]'s setings"   insp +35, 2.5 seconds, exhaled pressure -40, 2.5 seconds    for 5 cycles and may stop or repeat if needed for a maximum of 5 sets of 5 breaths    Pause is 2 seconds with 5 cms pressure      - We have an order for home XR. We will follow up on chest XR results when returned  - Increased saline nebulizer frequency to 4 times a day (up from three times per day)  - Increase vesting to 4 times.  - Since Keyontae is doing better today than yesterday, we can hold off on antibiotics for the moment.  - If sputum increases, if Syed continues to desaturate despite increase in frequency of saline and vesting, or if he begins to spike fevers, or if his behaviors worsen, please call the clinic and Nathinel will be instructed to begin course of antibiotics.  - We are also checking influenza, rsv and covid at this visit. We will notify you once results return

## 2021-12-03 LAB — COVID/INFLUENZA A & B/RSV NAAT (PCR)
COVID-19 NAAT (PCR): NEGATIVE
Influenza A NAAT (PCR): NEGATIVE
Influenza B NAAT (PCR): NEGATIVE
RSV NAAT (PCR): NEGATIVE

## 2021-12-04 ENCOUNTER — Encounter: Payer: Self-pay | Admitting: Primary Care

## 2021-12-04 ENCOUNTER — Telehealth: Payer: Self-pay | Admitting: Primary Care

## 2021-12-04 LAB — AEROBIC CULTURE: Aerobic Culture: 0

## 2021-12-04 MED ORDER — DOXYCYCLINE HYCLATE 100 MG PO CAPS *I*
100.0000 mg | ORAL_CAPSULE | Freq: Two times a day (BID) | ORAL | 0 refills | Status: AC
Start: 2021-12-04 — End: 2021-12-18

## 2021-12-04 NOTE — Telephone Encounter (Signed)
RN called. Has been having thicker secretions  Has increased wheezes and ronchi.  He is not as alert as he usually is.    BG up this AM, febrile to 102F.    Levofloxacin has not yet been started. He was evaluated by my partner Dr. Osborne Oman on Friday. They have not yet started the levofloxacin.    My review of his labs revealed a positive MRSA swab.    RN asks to increase budesonide. I counseled that this would likely not be helpful. Dad suspects allergies.    I had to call [employer] on call to obtain results, which revealed no acute cardiopulmonary disease.     Given the clinical context of fevers, increasing hypoxia, and decreased level of alertness I recommended hospital evaluation as this is a high risk state.  Dad declined this.    I therefore recommend the following:  - Increased airway clearance with vesting, DuoNebs, saline to 4 times daily  - Recommended they start levofloxacin as previously prescribed.  - Given MRSA positivity we will send in doxycycline as well.  Patient is allergic to Bactrim.    Encouraged them to call if they have any concerns.    Routing to [facility] RN to check in on Tuesday.    [name], MD

## 2021-12-06 ENCOUNTER — Encounter: Payer: Self-pay | Admitting: Oncology

## 2021-12-06 ENCOUNTER — Other Ambulatory Visit: Payer: Self-pay | Admitting: Pediatrics

## 2021-12-06 LAB — LEGIONELLA CULTURE: Legionella Culture: 0

## 2021-12-06 NOTE — Progress Notes (Signed)
Incoming paperwork for completion [facility]  Date Received: 12/06/2021    Type of paperwork:   . Sender/organization: [employer].  . Form type: Confirmation of Order - Initial Date: 12/27/18    Copy and scan before mailing? No    Completed paperwork to be sent to:  Fax to [phone]    Placed in box for: Sharmon Revere  2:49 PM  12/06/2021

## 2021-12-06 NOTE — Telephone Encounter (Signed)
Says that he is doing much better. Still having increased secretions but not as much as before. O2 is lower than normal. No fever. Father says that pt is eating and drinking well. Pt is tolerating abx well.     Denies new concerns. Notified that we will check in later this week. He would like if we could call [name] back for update.     Advised to call back for further concerns.

## 2021-12-07 ENCOUNTER — Encounter: Payer: Self-pay | Admitting: Gastroenterology

## 2021-12-07 ENCOUNTER — Other Ambulatory Visit: Payer: Self-pay | Admitting: Oncology

## 2021-12-07 DIAGNOSIS — Z9911 Dependence on respirator [ventilator] status: Secondary | ICD-10-CM

## 2021-12-07 DIAGNOSIS — J9600 Acute respiratory failure, unspecified whether with hypoxia or hypercapnia: Secondary | ICD-10-CM

## 2021-12-07 MED ORDER — SODIUM CHLORIDE 0.9 % IN NEBU *I*
5.0000 mL | INHALATION_SOLUTION | RESPIRATORY_TRACT | 5 refills | Status: DC | PRN
Start: 2021-12-07 — End: 2021-12-09

## 2021-12-08 ENCOUNTER — Telehealth: Payer: Self-pay | Admitting: Oncology

## 2021-12-08 ENCOUNTER — Encounter: Payer: Self-pay | Admitting: Oncology

## 2021-12-08 ENCOUNTER — Other Ambulatory Visit: Payer: Self-pay | Admitting: Pediatrics

## 2021-12-08 ENCOUNTER — Ambulatory Visit: Payer: Medicare Other | Attending: Oncology | Admitting: Rehabilitative and Restorative Service Providers"

## 2021-12-08 ENCOUNTER — Other Ambulatory Visit: Payer: Self-pay

## 2021-12-08 DIAGNOSIS — Z8669 Personal history of other diseases of the nervous system and sense organs: Secondary | ICD-10-CM

## 2021-12-08 DIAGNOSIS — Z9911 Dependence on respirator [ventilator] status: Secondary | ICD-10-CM | POA: Insufficient documentation

## 2021-12-08 DIAGNOSIS — G8250 Quadriplegia, unspecified: Secondary | ICD-10-CM | POA: Insufficient documentation

## 2021-12-08 NOTE — Telephone Encounter (Signed)
[facility] TELEPHONE TRIAGE     Reason for call: [name] states after speaking with clinical supervisor, he is questioning the need for patient to be on antibiotics. [name] states patients lab and x-ray results "are fine" so patient does not have a need to be prescribed antibiotics at this time as it is not "appropriate". [name] requested to speak to [facility] NP urgently, inquiring if antibiotics should be stopped or discontinued.     Name of caller: [name] for Angel Wells  Relationship to patient: Father  Phone:  [phone]

## 2021-12-08 NOTE — Telephone Encounter (Signed)
[facility] TELEPHONE TRIAGE     Reason for call: Angel Wells requests antibiotic order is changed to five day course. Writer relayed patients father had called requesting antibiotic is discontinued. Angel Wells states "whatever" and that patient needs five day supply of antibiotics as that is the "appropriate" course to treat patient.     Name of caller: Angel Wells for Angel Wells  Relationship to patient: PDN  Phone:  [phone]

## 2021-12-08 NOTE — Telephone Encounter (Signed)
Writer spoke with pharmacy. Pharmacy states for sodium chloride 0.9 % nebulizer solution it shows 5 mls, and would like it to be changed to 3 mls. Please advise.

## 2021-12-08 NOTE — Telephone Encounter (Signed)
I called and spoke to Dad  - he discussed that he spoke with [name] RN Debbie clinical supervisor  Who do not feel he should be on antibiotics.    I reviewed provider recommendations based on clinical assessment he needs to be on antibiotics and I cannot discontinue them based on my colleagues assessments.  Dad began to yell at me stating, "This is bullshit- you know hes going to get diarrhea and develop resistance he's fine- you're not listening to me."      I tried to redirect the conversation to which he got more angry and stated,   "Are you gonna come wipe his ass? It's on you that he develops resistance to these antibiotics."    I again  asked him to not speak to me this way- and to please lower his voice.    He continued to yell and that "you are not listening to me you're speaking to me like I lay man-I know him better than anyone I've been doing this for 22 years  - [name] always does this  And he's your patient but he's my son."    I continued to try to redirect dad but he continued to repeat the same things regarding we don't listen to him and Francisca December always doing this.  He is upset that he was not involved in visit with ordering provider.    I offered a reassessment visit- he said no.      I told him our medical director will contact him with concerns.  He said ok and also said "I already talked to [place] enough he doesn't listen. I dont want to talk to him."    Dad continued to yell regarding previous mentioned frustrations despite several requests of mine to lower his tone and not speak to me in that manner.    I ended the call by letting dad know that it was not acceptable to continue to speak to me this way and I could not offer him what he would like despite efforts to compromise and our medical director will reach out to discuss further.    Palma Holter, NP

## 2021-12-08 NOTE — Progress Notes (Signed)
Incoming paperwork for completion to the [facility]  Date Received: 12/08/2021    Type of paperwork:   . Sender/organization: [employer]  . Form type: Physician Order - Addendum    Copy and scan before mailing? No    Completed paperwork to be sent to:  Fax to [phone]    Placed in box for: Sharmon Revere  10:48 AM  12/08/2021

## 2021-12-08 NOTE — Telephone Encounter (Signed)
Hello! I did not see or treat the patient  So I would recommend following the provider recommendations who assessed him.

## 2021-12-08 NOTE — Progress Notes (Signed)
Physical Medicine & Rehabilitation  Occupational Therapy  Progress Note        Name:  Angel Wells  MRN:  [id number]  Diagnosis:    Encounter Diagnoses   Name Primary?   . Spastic quadriplegia Yes   . Ventilator dependence      Today's Date:  12/08/2021    Subjective: Presenting with aide present. Reports that he is now able to feed himself using home made design with rolled up towel and straps for support. Angel Wells reports improvements in strength and ROM with compliance to HEP. States that they are interested in anti-spasticity splint for R hand as well.     Pain: did not rate during session, although lethargic    Intervention:  TREATMENT TIME  DESCRIPTION   Neuromuscular Re-Education 15 min Reassessment of patient's progress towards functional goals   Splinting 15 min Fabrication of R anti-spasticity splint with resting slight wrist flexion d/t tone and full digit extension  Splint fits well, no signs of irritation or redness observed  Cut out space for styloid process to reduce pressure sores, added foam padding to area of possible skin irritation concern per aide instruction   ADL Training 15 min Review of eating techniques using homemade design with rolled towel and straps for support, pt able to feed self as shown in video    Review of using smaller plate and softer foods to assist with stabbing food with fork   Performance Deficits    ADL Status: Impaired, dependent for all aspects with exception of being able to bring some foods to mouth using right hand    Instrumental ADL/Community status:Impaired, dependent for all aspects    Transfers: dependent, wheelchair bound. Hoyer sling for transfers    Vision: Unable to accurately assess due to cognition. 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      Upper Extremity Assessment    Range of Motion: (unnoted implies WFL)  Shoulder  UE AROM (PROM) AROM (PROM)    Right Left   Scapula:     Protraction     Retraction trace trace   Elevation trace trace   Depression     Shoulder:     Extension/Flexion 45(100) Trace(106)   Abduction 65(90) 40(86)     Adduction     Horizontal Abduction     Horizontal Adduction     External Rotation/Internal Rotation (40)/rests in IR (23)/rests in IR   , Elbow  UE  AROM (PROM)  AROM (PROM)     Right  Left    Elbow Extension/Flexion Full/full Trace/80(full)   Supination/Pronation neutral(30) 60(full)   and Wrist   UE  AROM (PROM)  AROM (PROM)    Right  Left    Wrist Extension/Flexion neutral/(-10) (-30)/(full)   Radial/Ulnar Deviation             Strength: Manual Muscle Testing scores __/5 (unnoted implies [facility])  Measured  Motion Right Left   Scapular elevation     Shoulder flexion 2+ 1   Shoulder abduction 2+ 2+   Internal rotation     External rotation 1 1   Elbow flexion 3+ 2+/3-   Elbow extension 3 3-   Supination 3- 3-   Pronation     Wrist flexion  Wrist extension 1 1     Digits rest bilaterally in flexed position  Left wrist presenting in flexed position with firm end feel  Pt unable to use hands for fine motor aspects  Full composite flexion, unable to perform functional release    Grip/Pinch Assessment:unable to assess due to tone      Tone: MAS:     Right Bicep:1+  Right Wrist Flexors:3, significant clonus  Right Digit Flexors: 1+, clonus    Left Bicep:1  Left Wrist Flexors:2+, clonus  Left Digit Flexors:3, clonus    Goals:   In 1-2 sessions:  - [name] caregiver will demonstrate understanding of splint wearing schedule and hygiene/skin checks to reduce hypertonicity of left UE Continue, Hasn't been able to tolerate full time period  - [name] caregiver will demonstrate understanding of stretching and  neuroinhibitive strategies to prevent hypertonicity in bilateral UE Progress Made  - [name] caregivers will demonstrate understanding of various adapted equipment and devices to allow for improved independence with video gaming Recommended adapted game equipment  - [name] caregiver will demonstrate understanding of adapted techniques for feeding including built up handles or utensils forability to bring utensil to mouth independently Progress Made, Video Demo of patient being able to feed self with home made design  In 3-4 sessions:  - Pt will demonstrate ability to incorporate adapted utensils to perform feeding aspects with mod I Progress Made, Continue  - Pt will demonstrate ability to tolerate splint for tone management of LUE for at least 8 hours during the day Progress Made, Continue  - Pt will demonstrate ability to participate in desired hobbies (gaming) using adapted controller with mod I Continue with recommendations    Patient education: Instructed patient in HEP as above, Instructed patient in splint care, wear and precautions, Discussed progress towards goals, Discussed/Reviewed treatment plan    Assessment: Pt with relatively stable progress towards functional goals. Aides and support staff have been assisting with PROM and AAROM as tolerated. Spasticity limiting ROM and overhead ROM for self care aspects. Reviewed modifications such as switches and other strategies for improved independence during leisure aspects. Patient now demonstrating ability to participate in feeding aspects with R hand as noted in video shown and universal cuff. Anti-spasticity splint made for R hand this session for improved positioning. Patient likely at functional baseline at this time. Discussed ongoing recommendations and progress. No further skilled OT needed.     Plan: Discharge from OT    Serenity Batley, OT  Minutes:  Total time: 45 min     Timed mins: 45 min   Untimed: 0  Unbillable: 0

## 2021-12-09 ENCOUNTER — Encounter: Payer: Self-pay | Admitting: Gastroenterology

## 2021-12-09 ENCOUNTER — Ambulatory Visit: Payer: Medicare Other | Admitting: Primary Care

## 2021-12-09 ENCOUNTER — Telehealth: Payer: Self-pay

## 2021-12-09 DIAGNOSIS — J95851 Ventilator associated pneumonia: Secondary | ICD-10-CM

## 2021-12-09 DIAGNOSIS — J302 Other seasonal allergic rhinitis: Secondary | ICD-10-CM

## 2021-12-09 MED ORDER — SODIUM CHLORIDE 0.9 % IN NEBU *I*
3.0000 mL | INHALATION_SOLUTION | RESPIRATORY_TRACT | 5 refills | Status: DC | PRN
Start: 2021-12-09 — End: 2021-12-12

## 2021-12-09 NOTE — Addendum Note (Signed)
Addended by: Homero Fellers A on: 12/09/2021 11:00 AM     Modules accepted: Orders

## 2021-12-09 NOTE — Telephone Encounter (Signed)
Angel Wells called and asked if a nurse could call her back she is looking to schedule an appointment to discuss meds.     Call back [phone]

## 2021-12-09 NOTE — Progress Notes (Signed)
[facility]    TELEMEDICINE VISIT  CHIEF COMPLAINT     Chief Complaint   Patient presents with   . Breathing Problem            Subjective     TELEMEDICINE CONSENT     Visit being conducted by Video in lieu of a face to face visit to minimize health care worker and patient exposure to COVID-19, and conserve PPE.    Location of Patient: Home  Location of Telemedicine Provider: Clinic  Other Participants in telemedicine encounter and roles: home RN    Consent was obtained from the patient to complete this telemedicine visit; including the potential for financial liability.  How did the patient provide consent? Form Completed    SUBJECTIVE     Increased pulmonary secretions  Hypoxia  Tracheostomy and ventilator dependence    Has improved on levofloxacin/doxycycline, started on Monday. Today is day 5.    Oxygen levels have improved, currently 90-95% on room air   Secretions have improved, clear to right.   His energy levels and alertness has improved. He is "pretty much normal" -- joking around as prior.   Lungs sound clear per RN evaluation.   Has a good appetite.    Just got a new vent and oximeter, which they are satisfied withs.   They have been completing QID airway clearance -- vent alarms with vesting.   QID saline has been improved.     MEDICATIONS     Current Outpatient Medications   Medication Sig   . carbamide peroxide (DEBROX) 6.5 % otic solution use 5 drops twice daily on the first 3 days of each month.   . sodium chloride 0.9 % nebulizer solution Inhale 5 mLs by nebulization as needed for Wheezing  MCAID ID [id number]   . nystatin (MYCOSTATIN) powder apply topically 4 times daily  to the following areas: neck, groin, axilla   . doxycycline hyclate (VIBRAMYCIN) 100 mg capsule Take 1 capsule (100 mg total) by mouth every 12 hours for 14 days  for Pneumonia   . generic DME Low rate alarms set at 4 breaths, high rate alarm set at 40 breaths, pressure support set at 13 cms   .  levoFLOXacin (LEVAQUIN) 500 mg tablet Take 1 tablet (500 mg total) by mouth daily for 14 days  for Acute Bacterial Infection of the Sinuses, Community Acquired Pneumonia   . albuterol (PROVENTIL) (2.5 mg/21mL) 0.083% nebulizer solution Inhale 3 mLs (2.5 mg total) by nebulization 3 times daily  for Spasm of Lung Air Passages with vesting.  When ill, use four times a day with vesting.   Marland Kitchen TOUJEO MAX SOLOSTAR 300 UNIT/ML injection pen inject 85 units into the skin nightly for type 2 diabetes   . ipratropium (ATROVENT) 0.02 % nebulizer solution INHALE 2.5 MLS (500 MCG TOTAL) BY NEBULIZATION 2 TIMES DAILY FOR ASTHMA DX: J45.4   . B-D UF III MINI PEN NEEDLES 31G X 5 MM USE 3 (THREE) TIMES DAILY AS NEEDED.   Marland Kitchen SILACE 150 MG/15ML syrup take 10 mls (100 MILLIGRAM total) by mouth 2 times daily   . bisacodyl (DULCOLAX) 10 mg suppository please give suppository as needed if no bowel movement in 3 days, please check with dad prior to giving   . budesonide (PULMICORT) 0.25 mg/38mL nebulizer suspension INHALE 2 ML (CONTENT OF 1 VIAL) VIA NEBULIZER 2 TIMES DAILY FOR ASTHMA   . metFORMIN (GLUCOPHAGE) 500 MG/5ML solution administer 5 mls (500 MILLIGRAM total)  via g tube 2 times daily (with meals)   . generic DME Ventilator for home use with humidifier   . oxygen concentrator Use  as instructed   . generic DME Item to dispense: Bivona silicone cuffed trach tube size 7 80 mm length  Instructions for use: Use as directed   . Nebulizers (PARI PRONEB MAX NEBULIZER) device Pari pron neb or comperableUse as directed   . generic DME Item to dispense: trach care kits  Instructions for use: use as directed   . generic DME Item to dispense: trach ties  Instructions for use: use as directed   . Gauze Pads & Dressings (SPLIT GAUZE DRAINAGE SPONGE) 4"X4" Use  as instructed.   Marland Kitchen generic DME Item to dispense: 2X2 gauze   . generic DME Item to dispense: stationary pulse oximeter   . generic DME Item to dispense: Purple Passey-Muir valve  Instructions  for use: Use As Directed   . generic DME Vent settings:  SIMV/PC/PS, RATE 10 IP 21. PS 13, PEEP 7, HIGH PRESSURE 50, LOW PRESSURE 5, HIGH RR 40, LOW RR14   . suction catheter 14 Fr (ARGYLE) Needs 12 fr. Catheters. Use as directed for tracheal secretions   . suction machine Instructions for use: use as directed for clearance of secretions   . Oral Hygiene Products (Q-CARE COVERD YANKAUER/SUCTION) MISC Yankauer suction tip to be used when clearing oral secretions   . generic DME .Cough Assist and related supplies. Use as directed   . baclofen (LIORESAL) 10 mg tablet take 1 tablet (10 milligram total) by mouth 2 times daily   . levETIRAcetam (KEPPRA) 100 mg/mL solution give 15 milliliter by g-tube twice daily   . guaifenesin (QC MEDIFIN 400) 400 mg TABS Take 1 tablet (400 mg total) by mouth 2 times daily   . senna (HM SENNA) 8.6 mg tablet Administer 2 tablets via G tube daily   . GLUCAGON EMERGENCY 1 MG injection kit Use as directed AS NEEDED for blood glucose < 70   . generic DME Item to dispense: Multifunction Vent  Instructions for use: use 24 hours a day   . insulin aspart (NOVOLOG FLEXPEN) 100 UNIT/ML injection pen inject sub-cutaneously before meals: nutritional bl: 10 units + one half (1/2) as follows: <70: hold bl, 70-200: bl, > 200: bl+2, >250: bl+4, >300: bl+6, >350: bl +8. alert md for bg <70 and >350.   Marland Kitchen generic DME Item to dispense: Portable Suction Machine Repair or Replace  Instructions for use: Use As Directed   . calcium carbonate 650 mg tablet Take 1 tablet (650 mg total) by mouth daily  Via G Tube   . blood glucose (ACCU-CHEK AVIVA PLUS) test strip USE 1 STRIP THREE TIMES DAILY. DX: E11.65 AND Z79.4   . Accu-Chek FastClix Lancets MISC 1 EACH BY MISC.(NON-DRUG COMBO ROUTE) ROUTE 3 (THREE) TIMES DAILY. E11.65   . TOBI 300 MG/5ML nebulizer solution Inhale 5 mLs (300 mg total) by nebulization every 12 hours  for pseudomonas infection, trach/vented patient NEBULIZE 1 VIAL (300 MG) TWICE DAILY EVERY OTHER  MONTH   DIAGNOSIS: E84.0   . lacosamide (VIMPAT) 10 mg/mL SOLN oral solution Take 10 MILLILITER per tube twice daily. ; MAXIMUM DAILY DOSE 20 MILLILITER   . valproate (VALPROIC ACID) 50 mg/mL syrup take 5 mls (250 milligram total) by mouth 3 times daily   . nystatin (MYCOSTATIN) 100,000 Units/mL suspension Swish and swallow 5 mLs (500,000 units total) 4 times daily  for Candidiasis Fungal Infection of the  Oropharynx Swish and hold in mouth before swallowing.   Marland Kitchen generic DME Item to dispense: 2 Tracheostomies #7 Bivona Silicone Cuffed 80 mm Length  Instructions for use: use as directed   . generic DME Item to dispense: hospital bed mattress Instructions for use: use as directed   . generic DME Gel Overlay Mattress.  LON: 99, [id number]. Use as directed. G82.50, S06.9x9   . generic DME Item to dispense: Large Volume Nebulizer Pump 50 psi  Instructions for use: use as directed   . generic DME Non-sterile gloves size medium Use as directed. MCAID ID [id number] 2boxes/month   . nystatin (MYCOSTATIN) 100000 UNIT/GM cream Apply topically 2 times daily  to the following areas: buttocks   . sodium chloride 0.9% 0.9 % irrigation Irrigate with 250 mLs as directed 3 times daily   . syringe disposable (BD LUER-LOK) 10 ML Use as instructed for inflation of tracheostomy balloon.   . famotidine (PEPCID) 20 mg tablet take 1 tablet by mouth 2 (two) times daily. ok to crush it and put it down tube if necessary   . chlorhexidine (PERIDEX) 0.12 % solution use as directed   . Nebulizer MISC Disposable nebulizer kits to be used inline with trach  MCAID ID [id number]   . LACTULOSE 10 GM/15ML solution administer 30 mls (20 g total) via g tube daily as needed (no stool in 48 hours) use as directed   . fluticasone (FLONASE) 50 MCG/ACT nasal spray spray 1 spray into nostril daily   . clonazePAM (KLONOPIN) 1 mg tablet TAKE 1 TABLET BY MOUTH TWICE DAILY AS NEEDED (crush for g-tube)   . oxygen 1-2 L/min to keep O2 sat above 92%, may increase up to 4  lpm as needed.  Low saturation alarm 85%, high alarm off, low HR 50, High HR 200   . generic DME Item to dispense: Stationary Continuous Oximeter  Instructions for use: Use at night time Dx: Z99.11   . generic DME Cough assist machine repairs for: [employer] respironics cough assist model T70.   . ibuprofen (ADVIL,MOTRIN) 600 mg tablet Take 1 tablet (600 mg total) by mouth 4 times daily as needed for Pain for up to 30 doses   . generic DME Repair, parts, and labor for coughing assist machine. MCAID CIN: [id number]   . cetirizine (ZYRTEC) 10 mg tablet Take 1 tablet (10 mg total) by mouth nightly   . Misc. Devices (PULSE OXIMETER) MISC Pulse Oximeter with Supplies, LON: 99, [id number], J96.11, Z99.11   . Syringe Disposable 60 ML MISC Use as directed   . generic DME Adhesive tape 1" x 5 yards. Use as directed. Disp 3 box   . generic DME Suction canister Use as directed.MCAID ID [id number]   . generic DME Suction tubing Use as directed.MCAID ID [id number]   . suction machine Instructions for use: Use as needed for secretions, LON: 99 months, [id number]   . generic DME Pulse ox probe replacement tape. Use as directed.[id number], LON 99   . generic DME Pulse ox sensors (disposable)Use as directed.MCAID ID [id number], LON 99   . generic DME Hill Rom Percussion Vest Model # [id number]  Use as directed.   . Cholecalciferol 25 MCG (1000 UT) CHEW Take 1,000 units by mouth daily  2 chews daily   . Non-System Medication Ok to change Gtube at home by nursing staff every 3 months and PRN   . MIC-KEY gastrostomy kit 14FR Please dispense every 3 months and prn, MicKey 14Fr 2.5cm   .  EPINEPHrine (EPIPEN) 0.3 mg/0.3 mL auto-injector USE AS DIRECTED   . lactobacillus rhamnosus, GG, (CULTURELLE) capsule Take 1 capsule (1 each total) by mouth daily   . triamcinolone (KENALOG) 0.025 % cream Apply topically 2 times daily  APPLY TO AFFECTED AREA (Patient not taking: Reported on 03/30/2021)   . zinc oxide (DESITIN) 40 % paste Apply topically as needed for  Diaper Rash (Patient not taking: Reported on 09/15/2021)   . generic DME TRACH TIES WITH O2 PORT. Use as directed.MCAID ID [id number]   . MIC-KEY gastrostomy feeding tube extension set 12" Use as directed with gtube   . generic DME Adult size trach ties Use as directed.   Marland Kitchen generic DME Butterfly Harness for use in wheelchair, dispense 1. Use as directed. Dx G80.0, S06.9x9, LON: lifetime   . MIC-KEY gastrostomy feeding tube extension set 24" Use as directed, please dispense 4 per month   . gauze pads 2"X2" Use as directed around trach. MCAID ID [id number]   . Nebulizers (PARI LC PLUS VIOS PRO NEB) MISC Pari Vios Pro Neb or comparable with Nebulizer kits and nebulizer trach mask. Use twice daily With albuterol.MCAID # [id number], MCARE [id number]   . generic DME 15mm Adapter for trach to be used as directed. Replace weekly. Dx.Z99.11   . generic DME 15 mm adapters for nebulized therapies via trach  Medicare ID [id number], Medicaid ID [id number]   . nebulizer device with mask and tubing Use as directed   . generic DME Please dispense one 50mL syringe. Use as directed. (Patient not taking: Reported on 03/30/2021)   . generic DME MCAID ID [id number], BIVONA CUFFED TRACH 7.0 Please change trach every month and as needed.   Marland Kitchen generic DME Please dispense 30mL measuring cup. Use as directed.   . Gauze Pads & Dressings (SPLIT GAUZE DRAINAGE SPONGE) 4"X4" Use once and PRN times a day as instructed.   Marland Kitchen generic DME 10 isothermal adult "antiflect" connector. MCAID [id number] Use as directed.   Marland Kitchen generic DME Suction catheter with chimney valve.Use as directed.MCAID ID [id number]   . gauze sponge (CURITY) 4"X4" pads Use PRN as instructed.MCAID ID [id number]   . generic DME PASSEY MUIR VALVE (PURPLE) Use as tolerated.MCAID ID [id number]   . Adhesive Tape 1"x5yd TAPE By 1 Units no specified route as needed MCAID ID [id number]   . generic DME 10 ml syringes Use as directed.MCAID ID [id number]   . generic DME Trach care kits Use as directed.MCAID ID  [id number]   . surgical lubricant (SURGILUBE) gel Apply topically as needed MCAID ID [id number]   . Distilled Water LIQD Distilled water, use as directedMCAID ID [id number]   . generic DME Mepilex 4X 4. Use as directed.MCAID ID [id number]   . generic DME HME for trach humidification Use as directed.MCAID ID [id number]   . generic DME Cough Assist circuits Use as directed.MCAID ID [id number]   . generic DME Suction filter Use as directed.MCAID ID [id number]   . generic DME Nasal adapters Use as directed.MCAID ID [id number]   . diphenhydrAMINE (BENADRYL) 25 MG oral solid 25 mg by Per G Tube route nightly as needed for Sleep (Patient not taking: Reported on 03/30/2021)   . FIBER SELECT GUMMIES PO Take by mouth Give 2 gummies daily   . Multiple Vitamins-Minerals (HM MULTIVITAMIN ADULT GUMMY PO) Take by mouth Give 2 gummies daily   . Ascorbic Acid (VITAMIN C ADULT GUMMIES PO) Take 250 mg by mouth daily  Give 2 gummies once daily    .  oxygen 2 l/m bleed through ventilator at night   . Blood Glucose Monitoring Suppl (FIFTY50 GLUCOSE METER 2.0) w/Device KIT by Other route   . miconazole (MICATIN) 2 % powder 2 times daily as needed    . insulin pen needle (FIFTY50 PEN NEEDLES) 31G X 8 MM 1 each     Medications reviewed at today's visit       Objective     OBJECTIVE   PHYSICAL EXAM  This visit was performed during a pandemic event, and the physical exam was limited to my video observation of this patient's organ systems and/or body areas.   Gen:  comfortable, in no distress.   Eye: clear conjunctivae, anicteric sclerae   ENT: MMM  Resp: breathing comfortably         ASSESSMENT   Today we discussed the assessment and management of the following diagnosis:    ICD-10-CM ICD-9-CM   1. Seasonal allergies  J30.2 477.9   2. Ventilator associated pneumonia  J95.851 997.31     PLAN     Ventilator associated pneumonia  Seasonal allergies   Clinically improving. Suspect pneumonia vs tracheitis.   Complete 7 days total of  levofloxacin/doxycycline   Will continue with QID airway clearance until antibiotics are completed   Given the seasonal component to this, I suspect that allergies are playing a role. Therefore will send a referral to allergy to consider allergy testing/more aggressive management.    Communication difficulties    Reviewed dad's behavior with my colleague Homero Fellers NP on the phone. I reflected that years ago dad exhibited similar behavior with me on the phone.   Mr. Gladysz was apologetic, and did send in an apology regarding this to Ms. [name].   There have been also some difficulties with communication with [name]'s nurses, particularly when nursing calls our office and dad disagrees with them.   I therefore had to establish some recommendations for moving forward:   Regarding dad's communication and behavior on the phone. I discussed that if there are any additional episodes of abusive or vulgar language towards any staff/providers we will be unable to continue caring for [name].   Barring an emergency, we expect dad to be present for all phone calls such that he, [name]'s nurses, and our medical team are all on the same page.   Dad was in agreement with this.    Time Based:  I personally spent 45 minutes on the calendar day of the encounter, including pre and post visit work reviewing the EMR and management of this patient.     Orders Placed This Encounter   . AMB REFERRAL TO [facility]ERGY[facility]IMM[facility]OLOGY - [facility]     --Patient instructed to call if symptoms are not improving or worsening  --Follow-up arranged     [name], MD

## 2021-12-09 NOTE — Telephone Encounter (Signed)
Angel Wells called to thank me for all the help with Angel Wells's cough assist machine and to let me know the new equipment was delivered from [employer] and they were pleased with the service. While on the call, Angel Wells has requested a video visit for Angel Wells today. There was a difficult interaction with dad and Angel Wells yesterday ( dad sent a my chart apology) they are concerned that the antibiotics Angel Wells is on will give him GI upset resulting in skin breakdown on his buttocks from diarrhea and that he will develop resistance to antibiotics. Angel Wells would like a call back to discuss a video appointment today if possible.

## 2021-12-09 NOTE — Progress Notes (Signed)
Outgoing paperwork:    Completed paperwork received on  12/09/2021  Sent to scanning? Yes  Sent as outlined in prior documentation: Fax to [phone]    Sherron Ales  9:37 AM  12/09/2021

## 2021-12-09 NOTE — Telephone Encounter (Signed)
Writer spoke with home care RN, [name]; [name] notes that patient is doing well this morning on 1 L of oxygen; Reports clear lung sounds; Normal appetite and behavior;     Patient is currently on day 5 of antibiotics and home nurse and parent are requesting video visit offered by Homero Fellers to discuss medications further. [name] voices apologizes for the poor discussion that took place with Homero Fellers, NP - notes that dad has been increasingly stressed and is apologetic for poor outcomes of that conversation.

## 2021-12-11 ENCOUNTER — Encounter: Payer: Self-pay | Admitting: Oncology

## 2021-12-12 ENCOUNTER — Other Ambulatory Visit: Payer: Self-pay | Admitting: Oncology

## 2021-12-12 DIAGNOSIS — J9600 Acute respiratory failure, unspecified whether with hypoxia or hypercapnia: Secondary | ICD-10-CM

## 2021-12-12 DIAGNOSIS — Z9911 Dependence on respirator [ventilator] status: Secondary | ICD-10-CM

## 2021-12-12 NOTE — Telephone Encounter (Signed)
That's fine thanks for the update

## 2021-12-12 NOTE — Progress Notes (Signed)
Paperwork completed 12/12/2021  Sent as outlined in prior documentation

## 2021-12-14 ENCOUNTER — Encounter: Payer: Self-pay | Admitting: Oncology

## 2021-12-14 NOTE — Progress Notes (Signed)
[facility]coming [facility]  Date Received: 12/14/2021    Type of paperwork (sender:form type): [facility] levofloxacin  Completed paperwork to be sent to:  Fax to [phone]    Special Requests:     Placed in box for: [name]

## 2021-12-15 ENCOUNTER — Encounter: Payer: Self-pay | Admitting: Gastroenterology

## 2021-12-16 NOTE — Progress Notes (Signed)
Outgoing paperwork:    Completed paperwork received on  12/16/2021  Sent to scanning? Yes  Sent as outlined in prior documentation: Fax to [phone]    Angel Wells  11:28 AM  12/16/2021

## 2021-12-19 ENCOUNTER — Encounter: Payer: Self-pay | Admitting: Oncology

## 2021-12-19 NOTE — Progress Notes (Unsigned)
Incoming paperwork for [facility] to [facility]  Date Received: 12/19/2021    Type of paperwork:   . Sender/organization: [facility]  . Form type: Medication Review    Completed paperwork to be sent to:  Fax to [phone]    Placed in box for: Angel Wells  10:08 AM  12/19/2021

## 2021-12-21 ENCOUNTER — Encounter: Payer: Self-pay | Admitting: Gastroenterology

## 2021-12-21 NOTE — Progress Notes (Signed)
Outgoing paperwork:    Completed paperwork received on  12/21/2021  Sent to scanning? Yes  Sent as outlined in prior documentation: Fax to [phone]    Sherron Ales  4:04 PM  12/21/2021

## 2021-12-28 ENCOUNTER — Encounter: Payer: Self-pay | Admitting: Oncology

## 2021-12-28 ENCOUNTER — Other Ambulatory Visit: Payer: Self-pay | Admitting: Pediatrics

## 2021-12-28 NOTE — Progress Notes (Signed)
Incoming paperwork for [facility]  Date Received: 12/28/2021    Type of paperwork (sender:form type): [facility] Physician Orders 12/14/21    Completed paperwork to be sent to:  Fax to [phone]    Special Requests:     Placed in box for: [name]

## 2021-12-28 NOTE — Progress Notes (Signed)
Incoming paperwork for [facility] to [facility]  Date Received: 12/28/2021    Type of paperwork (sender:form type): [facility] Physician Orders    Completed paperwork to be sent to:  Fax to [phone]    Special Requests:     Placed in box for: [name]

## 2021-12-28 NOTE — Progress Notes (Signed)
Incoming paperwork for [facility] to [facility]  Date Received: 12/28/2021    Type of paperwork (sender:form type): [facility] Certification and Plan of Care 12/27/21-02/24/22    Completed paperwork to be sent to:  Fax to [phone]    Special Requests:     Placed in box for: [name]

## 2021-12-30 ENCOUNTER — Other Ambulatory Visit: Payer: Self-pay | Admitting: Primary Care

## 2022-01-02 ENCOUNTER — Other Ambulatory Visit: Payer: Self-pay | Admitting: Oncology

## 2022-01-04 ENCOUNTER — Encounter: Payer: Self-pay | Admitting: Oncology

## 2022-01-04 MED ORDER — GUAIFENESIN 400 MG PO TABS *I*
ORAL_TABLET | ORAL | 0 refills | Status: DC
Start: 2022-01-04 — End: 2022-03-27

## 2022-01-06 ENCOUNTER — Other Ambulatory Visit: Payer: Self-pay | Admitting: Oncology

## 2022-01-09 ENCOUNTER — Other Ambulatory Visit: Payer: Self-pay | Admitting: Oncology

## 2022-01-11 ENCOUNTER — Other Ambulatory Visit: Payer: Self-pay | Admitting: Oncology

## 2022-01-11 DIAGNOSIS — Z794 Long term (current) use of insulin: Secondary | ICD-10-CM

## 2022-01-11 DIAGNOSIS — E119 Type 2 diabetes mellitus without complications: Secondary | ICD-10-CM

## 2022-01-12 ENCOUNTER — Other Ambulatory Visit: Payer: Self-pay | Admitting: Neurology

## 2022-01-12 ENCOUNTER — Encounter: Payer: Self-pay | Admitting: Oncology

## 2022-01-12 ENCOUNTER — Other Ambulatory Visit: Payer: Self-pay | Admitting: Primary Care

## 2022-01-12 DIAGNOSIS — G40219 Localization-related (focal) (partial) symptomatic epilepsy and epileptic syndromes with complex partial seizures, intractable, without status epilepticus: Secondary | ICD-10-CM

## 2022-01-12 MED ORDER — LACOSAMIDE 10 MG/ML PO SOLN *I*
ORAL | 5 refills | Status: DC
Start: 2022-01-12 — End: 2022-06-16

## 2022-01-12 NOTE — Progress Notes (Signed)
Outgoing paperwork:    Completed paperwork received on  01/12/2022  Sent to scanning? Yes  Sent as outlined in prior documentation: Fax to [phone]    Lizbeth Bark  2:02 PM  01/12/2022

## 2022-01-12 NOTE — Progress Notes (Signed)
Outgoing paperwork:    Completed paperwork received on  01/12/2022  Sent to scanning? Yes  Sent as outlined in prior documentation: Fax to [phone]    Latoria Dry  2:00 PM  01/12/2022

## 2022-01-12 NOTE — Progress Notes (Signed)
Incoming paperwork for [facility] to [facility]  Date Received: 01/12/2022    Type of paperwork (sender:form type): [employer]    Completed paperwork to be sent to:  Fax to [phone]    Special Requests:     Placed in box for: [name]

## 2022-01-12 NOTE — Progress Notes (Signed)
Outgoing paperwork:    Completed paperwork received on  01/12/2022  Sent to scanning? Yes  Sent as outlined in prior documentation: Fax to [phone]    Lizbeth Bark  2:00 PM  01/12/2022

## 2022-01-16 ENCOUNTER — Encounter: Payer: Self-pay | Admitting: Gastroenterology

## 2022-01-16 ENCOUNTER — Encounter: Payer: Self-pay | Admitting: Oncology

## 2022-01-17 NOTE — Progress Notes (Signed)
Paperwork completed 01/17/2022  Sent as outlined in prior documentation

## 2022-01-18 ENCOUNTER — Other Ambulatory Visit: Payer: Self-pay | Admitting: Pediatrics

## 2022-01-18 ENCOUNTER — Other Ambulatory Visit: Payer: Self-pay | Admitting: Internal Medicine

## 2022-01-19 ENCOUNTER — Other Ambulatory Visit: Payer: Self-pay | Admitting: Pediatrics

## 2022-01-20 ENCOUNTER — Other Ambulatory Visit: Payer: Self-pay | Admitting: Oncology

## 2022-01-26 ENCOUNTER — Telehealth: Payer: Self-pay | Admitting: Pediatrics

## 2022-01-26 NOTE — Telephone Encounter (Signed)
Copied from CRM [id number]. Topic: Access to Care - Speak to Provider/Office Staff  >> Jan 26, 2022 12:14 PM Rulon Abide wrote:  [name], patients nurse from home is calling to ask if the patient will need to stop taking his Fluticasone 1 spray into nostrel daily 50 micrograms  medication before his skt on 12/11.     She would like  a call back at [phone]

## 2022-01-26 NOTE — Telephone Encounter (Unsigned)
Copied from CRM [id number]. Topic: Appointments - Reschedule Appointment  >> Jan 26, 2022 11:52 AM Rulon Abide wrote:  [facility] Aide/Nurse,  is calling to cancel the patients  NPV/SKT  appointment which is currently scheduled for 11/7    Reason for the cancellation: Patients nurse is unavailable to go with him that day     Has the appointment been cancelled? yes    Has the appointment been rescheduled? yes    Date of new appointment?12/ 11    Does the patient need a call back to reschedule?no    [name] can be contacted back at [phone]    Writer was unsure if home nurse could change the patients appointment. Writer reached out to management and was given the ok as the patient does not have an emergency contact or documentation stating his home nurses have access to appointments and information.

## 2022-01-26 NOTE — Telephone Encounter (Signed)
Writer called to speak with [name], RN. Writer informed nurse that it is ok for Mike to take the Flonase and to just discontinue his cetrizine 5 days prior to the appointment.     Writer educated home RN on the 38 different pricks that are obtained in environmental skin testing and what it entailed. RN was appreciative of this.    Writer will send e-mail to home RN with updated meds to hold list for her viewing.

## 2022-01-30 ENCOUNTER — Other Ambulatory Visit: Payer: Self-pay | Admitting: Oncology

## 2022-01-30 NOTE — Telephone Encounter (Signed)
[facility]  Prior Authorization Request    Date of request: 01/30/2022  Time of call: 12:56 PM    Medication Requiring PA: metFORMIN (GLUCOPHAGE) 500 MG/5ML solution    Directions(dosing and frequency): take 10 mls (1,000 MILLIGRAM total) by mouth 2 times daily (with meals) for type 2 diabetes    Quantity/ Day Supply: 473 mL    Dx code: E11.65, Z79.4    Request received from:[facility] PA Source: Pharmacy    Name and Phone Number of Pharmacy: [facility] [phone]    Additional Pharmacy Benefit plan: no    Secondary insurance: Yes    Please run secondary if not covered under primary: yes  Medicaid    Insurance coverage confirmed: yes    Covered Alternative Medication:     Tax ID: [id number]    Provider and NPI: [name]- [id number]    Additional Justification:n/a    Key ID: [id number]    Please provide justification for PA submission & urgency or indicate which covered alternative was ordered

## 2022-01-31 ENCOUNTER — Ambulatory Visit: Payer: Medicare Other | Admitting: Optometry

## 2022-02-01 NOTE — Telephone Encounter (Signed)
Writer spoke to Angel Wells who is the Nurse, children's that over see's the nursing staff that treats patient.  Angel Wells patients father has granted her proxy access to Angel Wells's my chart just so she can be of help to Angel Wells but she will never call here with concerns or if medical issues are happening that will always be Angel Wells.  She has also had this conversation with him and we are all on the same page.      She wants our staff to know that she is always available if questions or concerns come up that are regarding her nurses or his treatment.  Her Office # is [phone]  Cell [phone]  Email is [email or web address]

## 2022-02-02 ENCOUNTER — Telehealth: Payer: Self-pay | Admitting: Oncology

## 2022-02-02 NOTE — Telephone Encounter (Signed)
[facility] TELEPHONE INTAKE    Reason for call: Tammy Sours from pharmacy called and would like to speak with nurse/PCP regarding patient's calcium carbonate 650 mg tablet.  Tammy Sours states it is hard for the tablets to dissolve, and would like to consider changing the scrip.    Name of caller: Tammy Sours  Relationship to patient: Pharmasist  Organization: [facility]     Phone:  [phone]

## 2022-02-03 MED ORDER — CALCIUM CARBONATE ANTACID 500 MG PO CHEW *I*
1.0000 | CHEWABLE_TABLET | Freq: Every day | ORAL | 11 refills | Status: DC
Start: 2022-02-03 — End: 2022-06-23

## 2022-02-03 NOTE — Telephone Encounter (Signed)
Writer spoke with Juanita Craver, [facility]ist whom recommends changing prescription to [facility] or TUMS Ultra for easier dissolving through a g-tube aiding to prevent clogging.     If PCP would like to move forward with change, please send new script to pharmacy for fill.     Gary Fleet, RN

## 2022-02-03 NOTE — Telephone Encounter (Signed)
Just an FYI there is no 650 tums option  So will send in 500mg  once a day via gtube since calcium level was 10.1 with last labs.

## 2022-02-03 NOTE — Telephone Encounter (Signed)
Writer spoke with Father, [name] and reviewed recommended changes from pharmacy; Father in agreeance with medication change.     Gary Fleet, RN

## 2022-02-08 ENCOUNTER — Other Ambulatory Visit: Payer: Self-pay | Admitting: Primary Care

## 2022-02-08 ENCOUNTER — Other Ambulatory Visit: Payer: Self-pay | Admitting: Oncology

## 2022-02-08 ENCOUNTER — Other Ambulatory Visit: Payer: Self-pay | Admitting: Pediatrics

## 2022-02-09 ENCOUNTER — Ambulatory Visit: Payer: Medicare Other | Admitting: Optometry

## 2022-02-09 ENCOUNTER — Ambulatory Visit: Payer: Medicare Other | Admitting: Optometrist

## 2022-02-10 ENCOUNTER — Other Ambulatory Visit: Payer: Self-pay | Admitting: Oncology

## 2022-02-10 ENCOUNTER — Other Ambulatory Visit: Payer: Self-pay | Admitting: Pediatrics

## 2022-02-10 DIAGNOSIS — J4540 Moderate persistent asthma, uncomplicated: Secondary | ICD-10-CM

## 2022-02-15 NOTE — Progress Notes (Signed)
Cardiology Consultation Note:   Angel Wells presents today accompanied by staff.  He is a 31 year old gentleman with a traumatic brain injury and seizure disorder as well as diabetes who has been followed closely for pulmonary issues.  An x-ray was obtained at some point suggesting the possibility of a tortuous aorta although the specifics are not available and quick review of the x-rays does not reveal significant pathology.  The aide who accompanies him today does not believe he has had any CT scanning lately.  On further history he apparently has been feeling quite well.  He has not had any cardiac specific complaints.  His blood pressure and diabetes have been under good control.  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  At night only  -Asthma  -AODM. Insulin requiring  Meds:  . ACCU-CHEK AVIVA PLUS TEST STRP Misc Strp USE 1 STRIP BY MISC.(NON-DRUG COMBO ROUTE) ROUTE 3 (THREE) TIMES DAILY AS NEEDED.   Marland Kitchen acetaminophen 160 mg/5 mL (5 mL) Oral suspension (PEDS) Take 15 mg/kg by mouth every 4 (four) hours as needed for Fever.   Marland Kitchen albuterol 2.5 mg /3 mL (0.083 %) Inhl nebulizer solution Take 3 mLs by nebulization every 6 (six) hours as needed for Wheezing.   Marland Kitchen ascorbic acid, vitamin C, 250 mg Oral Chew Take 2 tablets by mouth daily.   Marland Kitchen BACLOFEN 10 MG Oral tablet TAKE 1 TABLET BY MOUTH TWICE A DAY   . bisacodyl 10 mg Rect suppository Insert one suppository rectally on 3rd day if no BM   . budesonide 0.25 mg/2 mL Inhl nebulizer solution INHALE 1 VIAL VIA NEBULIZER TWICE DAILY   . calcium carbonate-vitamin D3 600 mg (1,500 mg)-800 unit Oral Chew Take 1 tablet by mouth daily.   . carbamide peroxide 6.5 % Otic otic solution use 5 drops twice daily on the first 3 days of each month.   . cetirizine 10 MG Oral tablet take 1 tablet (10 MILLIGRAM total) by mouth nightly   . chlorhexidine 0.12 % MM solution Use as  directed 15 mLs in the mouth or throat 2 (two) times daily.   . cholecalciferol, vitamin D3, 25 mcg (1,000 unit) Oral Chew Take 1,000 Units by mouth.   . clonazePAM 1 MG Oral tablet 1 mg tablet crushed and put through G tube as needed for cluster seizures (tx no more than 3 back to back sz)   . dextromethorphan-guaiFENesin 10-100 mg/5 mL Oral syrup Take 10 mLs by mouth every 4 (four) hours if needed for Cough.   . diabetic supplies, miscellan. Misc Misc Test 6 times daily   . docusate sodium 50 mg/5 mL Oral liquid take 10 mls (100 MILLIGRAM total) by mouth 2 times daily   . EPINEPHrine (EPIPEN) 0.3 mg/0.3 mL Inj injection Inject 0.3 mLs into the muscle daily as needed for Anaphylaxis.   . famotidine 20 MG Oral tablet Take 1 tablet by mouth 2 (two) times daily. Ok to crush it and put it down tube if necessary   . fluticasone propionate 50 mcg/actuation Nasl nasal spray 1 spray by Nasal route.   Marland Kitchen glucagon HCL 1 mg Inj SolR Inject 1 mg as directed as needed.   Marland Kitchen ibuprofen 600 MG Oral tablet Take 1 tablet by mouth every 8 (eight) hours as needed for Pain.   . insulin aspart U-100 (NOVOLOG FLEXPEN U-100 INSULIN) 100 unit/mL (3 mL) SubQ InPn Inject  2 Units into the skin With meals, as needed. (Patient taking differently: Inject 10 Units into the skin With meals, as needed.)   . inulin (FIBER GUMMIES ORAL) Take 2 tablets by mouth daily.   . IPRATROPIUM 0.02 % Inhl nebulizer solution TAKE 2.5 MLS BY NEBULIZATION 4 (FOUR) TIMES DAILY.   Marland Kitchen lacosamide 10 mg/mL Oral oral solution Take 10 MILLILITER per tube twice daily. ; MAXIMUM DAILY DOSE 20 MILLILITER   . LACTULOSE 10 gram/15 mL Oral solution TAKE 15 MLS BY MOUTH 3 (THREE) TIMES DAILY. (Patient taking differently: as needed. TAKE 15 MLS BY MOUTH 3 (THREE) TIMES DAILY.)   . lancets (ACCU-CHEK SOFTCLIX LANCETS) Misc Misc 1 application by Misc.(Non-Drug; Combo Route) route 3 (three) times daily as needed. DX E11.65   . lancing device (LANCING DEVICE WITH LANCETS) Misc Misc 1  each by Misc.(Non-Drug; Combo Route) route 3 (three) times daily. Accuchek fastclix device   . LEVETIRACETAM 100 mg/mL Oral solution GIVE 15 MLS BY G TUBE ROUTE 2 (TWO) TIMES DAILY.   . metFORMIN 500 mg/5 mL Oral SSRR administer 5 mls (500 MILLIGRAM total) via g tube 2 times daily (with meals)   . miconazole 2 % Top powder In groin   . miscellaneous medical supply Misc Misc Adult diaper- tena  48 inch waist  DX N39.46   . miscellaneous medical supply Misc Misc Medium gloves- one box  DX N39.46   . miscellaneous medical supply Misc Misc chux pads  N39.46   . miscellaneous medical supply Misc Misc Washable chux pads  DX N39.46   . miscellaneous medical supply Misc Misc Semi electric hospital bed   . miscellaneous medical supply Misc Misc Patient needs one box of each of the following: 60cc Cath tip piston syringes, 2x2 split gauze, 4x4 Split gauze, Long Q-tips   . miscellaneous medical supply Misc Misc 60 cc syringes use as needed   . miscellaneous medical supply Misc Misc Over the bed table.   . miscellaneous medical supply Misc Misc Alcohol pads   . miscellaneous medical supply Misc Misc Please dispense single use bullet for Trach/Nebulizer   . miscellaneous medical supply Misc Misc Please dispense Ceiling Lift to assist with quality of life  Dx: Traumatic Brain Injury S06.9X9S, Tetraplegia G82.50   . miscellaneous medical supply Misc Misc Please dispense one gel mattress  Dx: Q65.9X9A-Traumatic Brain Injury, G82.5-Tetraplegia   . miscellaneous medical supply Misc Misc Please dispense 14 Fr Kathryne Eriksson with extension/attachments   . multivit-minerals/folic acid (ADULT MULTIVITAMIN GUMMIES ORAL) Take 2 tablets by mouth daily.   Marland Kitchen nystatin Top powder Apply 1 application topically 4 (four) times daily.   . pen needle, diabetic (BD ULTRA-FINE MINI PEN NEEDLE) 31 gauge x 3/16" Misc Ndle USE 3 (THREE) TIMES DAILY AS NEEDED.   Marland Kitchen senna 8.6 mg Oral tablet administer 2 tablets via g tube daily   . sodium chloride 0.9 % IR  irrigation IRRITGATE WITH 250 MLS AS DIRECTED THREE TIMES DAILY FOR G-TUBE ADMINISTRATION WHILE ILL   . tobramycin, PF, (TOBI) 300 mg/5 mL nebulizer solution Inhale 5 mLs into the lungs Every 12 hours.   . TOUJEO MAX U-300 SOLOSTAR 300 unit/mL (3 mL) SubQ InPn 34 Units at bedtime.   Marland Kitchen VALPROATE 250 mg/5 mL Oral syrup TAKE 5 MLS BY MOUTH 3 (THREE) TIMES DAILY     Allergies:  Allergies   Allergen Reactions   . Ativan [Lorazepam] Unknown     Unknown   . Bactrim [Sulfamethoxazole-Trimethoprim] Anaphylaxis  Unknown   . Zosyn [Piperacillin-Tazobactam] Other (See Comments)     BP drops   . Vancomycin Rash     unknown     Family and Social History:  Family History   Problem Relation Age of Onset   . Depression Mother    . Diabetes Father    . Heart disease Father    . Hyperlipidemia Father    . Hypertension Father    . Cancer Neg Hx      Social History     Socioeconomic History   . Marital status: Single     Spouse name: Not on file   . Number of children: Not on file   . Years of education: Not on file   . Highest education level: Not on file   Occupational History   . Not on file   Tobacco Use   . Smoking status: Never   . Smokeless tobacco: Never   Substance and Sexual Activity   . Alcohol use: No   . Drug use: Not on file   . Sexual activity: Not on file   Other Topics Concern   . Not on file   Social History Narrative   . Not on file     Social Determinants of Health     Financial Resource Strain: Not on file   Food Insecurity: Not on file   Transportation Needs: Not on file   Physical Activity: Not on file   Stress: Not on file   Social Connections: Not on file   Intimate Partner Violence: Not on file   Housing Stability: Not on file       Review of Systems:  A comprehensive review of systems was performed as follows:   General:  See HPI, HEENT:  Negative, Cardiovascular: See HPI, Respiratory: See HPI,  Musculoskeletal:  Negative, GI: Negative, GU:  Negative, Skin:  Negative,  Neurologic:  Negative, Psychiatric:   Negative, Endocrine:  Negative, Hematologic/Lymphatic:  Negative,  Allergy/Rheumatologic: Negative,  Other: Negative.     height is 5\' 7"  (1.702 m). His blood pressure is 98/62 and his pulse is 90. His oxygen saturation is 97%.    Wt Readings from Last 3 Encounters:   11/07/17 185 lb (83.9 kg)   09/12/17 185 lb (83.9 kg)   08/23/17 185 lb (83.9 kg)       General Appearance:  No acute distress   Head:  Normocephalic, without obvious abnormality, atraumatic   Eyes:  Non-jaundiced   Neck: Supple,  trachea midline; no carotid bruit. No JVD   Back:   Without obvious scoliosis, lordosis   Lungs:   Clear to auscultation bilaterally, respirations unlabored   Heart:  PMI non-displaced. Regular rate and rhythm, S1 and S2 normal, no murmurs, rub, or gallop.     Abdomen:   Soft, non-tender, bowel sounds active,  no masses, liver and spleen are not enlarged, Aorta does not appear to be enlarged.  No bruits noted   Extremities: Extremities normal, atraumatic, warm with no clubbing, cyanosis or edema   Pulses: Carotids 2+, Radial 2+ Femorals 2+ without bruit, Pedal 2+   Skin: Skin color, texture, turgor normal, no rashes or lesions         Relevant Labs:  No results found for requested labs within last 365 days.     EKG reveals sinus rhythm without acute changes    Assessment:    Angel Wells presents today accompanied by staff.  He has had a prior traumatic brain injury  and is unable to answer questions.  Per staff apparently some testing was obtained that did suggest the finding that needed to be reviewed.  I did spend time reviewing all tests available including his chest x-rays.  There is a referral suggesting tortuous aorta but at this point I do not see any data to confirm this.  His exam and EKG are unremarkable and according to staff he has not had any cardiac specific symptoms.  There is certainly additional testing that can be done including echocardiography or chest CT scanning but at this point I think it may be too much and  I do not believe any significant cardiac pathology is showing itself at this time.  I would favor a more conservative approach for now and we have opted to hold off on additional testing.  I remain available should any further assistance be required.    Thank you  for allowing me to participate in the care of this patient.

## 2022-02-28 ENCOUNTER — Encounter: Payer: Self-pay | Admitting: Oncology

## 2022-02-28 NOTE — Progress Notes (Signed)
Incoming paperwork for completion to the [facility]  Date Received: 02/28/2022    Type of paperwork:   ? Sender/organization: [employer]  ? Form type: Home Health Certification and Plan of Care    Copy and scan before mailing? No    Completed paperwork to be sent to:  Fax to [phone]    Placed in box for: Sharmon Revere  10:01 AM  02/28/2022

## 2022-03-01 ENCOUNTER — Encounter: Payer: Self-pay | Admitting: Gastroenterology

## 2022-03-03 ENCOUNTER — Other Ambulatory Visit: Payer: Self-pay | Admitting: Oncology

## 2022-03-03 ENCOUNTER — Encounter: Payer: Self-pay | Admitting: Oncology

## 2022-03-03 DIAGNOSIS — G40909 Epilepsy, unspecified, not intractable, without status epilepticus: Secondary | ICD-10-CM

## 2022-03-03 NOTE — Telephone Encounter (Signed)
Confidential Drug Report  Search Terms: Haydon Grumbles, Mar 28, 1991   Search Date: 03/03/2022 15:40:36 PM   Searching on behalf of: [id number] - Angel Wells   The Drug Utilization Report below displays all of the controlled substance prescriptions, if any, that your patient has filled in the last twelve months. The information displayed on this report is compiled from pharmacy submissions to the Department, and accurately reflects the information as submitted by the pharmacies.   This report was requested by: Gerrie Nordmann  Reference #: [id number]   Practitioner Count: 1   Pharmacy Count: 1   Current Opioid Prescriptions: 0   Current Benzodiazepine Prescriptions: 0   Current Stimulant Prescriptions: 0         Patient Demographic Information [facility])  ???  [facility] First Name Last Name Birth Date Gender Street Address [facility] Zip Code   A Kouki Holzapfel 1991-02-14 Male [address] [place] [address]        Search:  [facility] My Rx Current Rx Drug Type Rx Written Rx Dispensed Drug Quantity Days Supply Prescriber Name Prescriber [facility] # Payment Method Dispenser   A U Y  01/12/2022 02/10/2022 lacosamide 10 mg/ml solution  600 30 Azucena Kuba (MD) [id number] Insurance [facility]   A U N  01/12/2022 01/13/2022 lacosamide 10 mg/ml solution  600 30 Azucena Kuba (MD) [id number] Insurance [facility]   A U N  07/13/2021 11/15/2021 lacosamide 10 mg/ml solution  600 30 Azucena Kuba (MD) [id number] Insurance [facility]   A U N  07/13/2021 10/14/2021 lacosamide 10 mg/ml solution  600 30 Azucena Kuba (MD) [id number] Insurance [facility]   A U N  06/06/2021 09/13/2021 lacosamide 10 mg/ml solution  600 30 Azucena Kuba (MD) [id number] Insurance [facility]   A U N  06/06/2021 08/15/2021 lacosamide 10 mg/ml solution  600 30 Azucena Kuba (MD) [id number] Insurance [facility]   A U N  07/13/2021 07/14/2021 lacosamide 10 mg/ml solution  600 30 Azucena Kuba (MD) [id number] Insurance  [facility]   A U N  06/06/2021 07/05/2021 lacosamide 10 mg/ml solution  200 10 Azucena Kuba (MD) [id number] Insurance [facility]   A U N  06/06/2021 06/27/2021 lacosamide 10 mg/ml solution  200 10 Azucena Kuba (MD) [id number] Insurance [facility]   A U N  06/06/2021 06/17/2021 lacosamide 10 mg/ml solution  200 10 Azucena Kuba (MD) [id number] Insurance [facility]   A U N  06/06/2021 06/06/2021 lacosamide 10 mg/ml solution  200 10 Azucena Kuba (MD) [id number] Insurance [facility]   A U N  11/29/2020 05/26/2021 lacosamide 10 mg/ml solution  200 10 Azucena Kuba (MD) [id number] Insurance [facility]   A U N  11/29/2020 05/18/2021 lacosamide 10 mg/ml solution  200 10 Azucena Kuba (MD) [id number] Insurance [facility]   A U N  11/29/2020 05/04/2021 lacosamide 10 mg/ml solution  200 10 Azucena Kuba (MD) [id number] Insurance [facility]   A U N  11/29/2020 04/20/2021 lacosamide 10 mg/ml solution  200 10 Azucena Kuba (MD) [id number] Insurance [facility]   A U N  11/29/2020 04/08/2021 lacosamide 10 mg/ml solution  200 10 Azucena Kuba (MD) [id number] Insurance [facility]   A U N  11/29/2020 03/31/2021 lacosamide 10 mg/ml solution  200 10 Azucena Kuba (MD) [id number] Insurance [facility]   A U N B 03/30/2021 03/31/2021 clonazepam 1 mg tablet  10 5 Angel Wells,  A, MS FNP-Bc [id number] Insurance [facility]   A U N  11/29/2020 03/18/2021 lacosamide 10 mg/ml solution  200 10 Azucena Kuba (MD) [id number] Insurance [facility]     Showing 1 to 19 of 19 entries

## 2022-03-04 ENCOUNTER — Other Ambulatory Visit: Payer: Self-pay | Admitting: Oncology

## 2022-03-06 ENCOUNTER — Other Ambulatory Visit: Payer: Self-pay | Admitting: Oncology

## 2022-03-07 ENCOUNTER — Other Ambulatory Visit: Payer: Self-pay | Admitting: Pediatrics

## 2022-03-07 ENCOUNTER — Other Ambulatory Visit: Payer: Self-pay | Admitting: Oncology

## 2022-03-09 ENCOUNTER — Other Ambulatory Visit: Payer: Self-pay | Admitting: Oncology

## 2022-03-09 NOTE — Progress Notes (Signed)
Outgoing paperwork:    Completed paperwork received on  03/09/2022  Sent to scanning? Yes  Sent as outlined in prior documentation: Fax to [phone]    [name]  10:52 AM  03/09/2022

## 2022-03-10 ENCOUNTER — Ambulatory Visit: Payer: Medicare Other | Admitting: Optometrist

## 2022-03-10 ENCOUNTER — Telehealth: Payer: Self-pay | Admitting: Optometrist

## 2022-03-10 ENCOUNTER — Other Ambulatory Visit: Payer: Self-pay | Admitting: Oncology

## 2022-03-10 DIAGNOSIS — G8250 Quadriplegia, unspecified: Secondary | ICD-10-CM

## 2022-03-10 DIAGNOSIS — N3946 Mixed incontinence: Secondary | ICD-10-CM

## 2022-03-10 MED ORDER — PAPER TAPE 1"X10YD TAPE
MEDICATED_TAPE | 5 refills | Status: DC
Start: 2022-03-10 — End: 2023-10-10

## 2022-03-10 MED ORDER — CATHETER SYRINGES 60 ML MISC *I*
5 refills | Status: DC
Start: 2022-03-10 — End: 2022-06-23

## 2022-03-10 NOTE — Telephone Encounter (Signed)
03/10/2022    I wanted you to be aware that the following patient has cancelled their appointment and rescheduled     Provider Name: Everardo Beals    Date of Appointment: 03/10/22   Patient Name: Angel Wells   MRN: [id number]   DOB: Mar 31, 1991       Reason for Cancellation:  Patient transportation canceled        Thank you,  Inge Rise

## 2022-03-10 NOTE — Telephone Encounter (Signed)
Chryl Heck, RN from [employer] team, requesting to receive prescriptions for:    ? (300) units of 1" paper tape per month  ? (5) 60 ML catheter-tip syringes per month     Christy requests completed and signed prescriptions are faxed to her office, [phone]. Rx order pending for review.

## 2022-03-14 NOTE — Telephone Encounter (Signed)
Prescriptions faxed to [facility] at [phone].

## 2022-03-16 ENCOUNTER — Other Ambulatory Visit: Payer: Self-pay | Admitting: Oncology

## 2022-03-16 ENCOUNTER — Other Ambulatory Visit: Payer: Self-pay | Admitting: Pediatrics

## 2022-03-16 ENCOUNTER — Telehealth: Payer: Self-pay | Admitting: Oncology

## 2022-03-16 ENCOUNTER — Encounter: Payer: Self-pay | Admitting: Oncology

## 2022-03-16 DIAGNOSIS — G40909 Epilepsy, unspecified, not intractable, without status epilepticus: Secondary | ICD-10-CM

## 2022-03-16 NOTE — Telephone Encounter (Signed)
[facility] TELEPHONE TRIAGE     Reason for call: Dad called says patient  has had a lot of thicker yellow secretions . He is wondering if you can put in a sputum order. He is requesting you [name] the nurse at the number below.  Name of caller: Lathan Quiroa  Relationship to patient: Dad  Phone: [phone]

## 2022-03-16 NOTE — Telephone Encounter (Signed)
Patient's dad Viviann Spare called requesting a video visit for tomorrow  and a mobile x-ray today for patient.    Viviann Spare can be reached at [phone]

## 2022-03-16 NOTE — Telephone Encounter (Signed)
Please clarify hasn't been prescribed clonazepam in a long time.  [name], MD

## 2022-03-16 NOTE — Telephone Encounter (Signed)
Called and spoke with Angel Wells  Reports that she is "on the fence" with how pt is doing  Yesterday he had copious, thick green drainage from his nose as well as a low grade temp (99.5) that resolved with ibuprofen  Today he is afebrile  Pale, loose drainage from trach  Saturations are 90-97%  BG's have been stable (low 120's)  Good urine output  Appetite is down  He is a "little whiney"    Angel Wells has a sputum sample if we would like  She is also able to get blood work  Will increase vesting from BID to TID  He is getting allergy meds and nebulizers  Dad thinks it is probably allergies    Will route to provider to advise

## 2022-03-16 NOTE — Telephone Encounter (Signed)
A user error has taken place: encounter opened in error, closed for administrative reasons.

## 2022-03-16 NOTE — Telephone Encounter (Signed)
Patient Demographic Information [facility])    [facility] First Name Last Name Birth Date Gender Street Address [facility] Zip Code   A Angel Wells [birth year] Male [address] [place] [address]        Search:  [facility] My Rx Current Rx Drug Type Rx Written Rx Dispensed Drug Quantity Days Supply Prescriber Name Prescriber [facility] # Payment Method Dispenser   A N Y  01/12/2022 03/09/2022 lacosamide 10 mg/ml solution  600 30 Azucena Kuba (MD) [id number] Insurance [facility]   A N N  01/12/2022 02/10/2022 lacosamide 10 mg/ml solution  600 30 Azucena Kuba (MD) [id number] Insurance [facility]   A N N  01/12/2022 01/13/2022 lacosamide 10 mg/ml solution  600 30 Azucena Kuba (MD) [id number] Insurance [facility]   A N N  07/13/2021 11/15/2021 lacosamide 10 mg/ml solution  600 30 Azucena Kuba (MD) [id number] Insurance [facility]   A N N  07/13/2021 10/14/2021 lacosamide 10 mg/ml solution  600 30 Azucena Kuba (MD) [id number] Insurance [facility]   A N N  06/06/2021 09/13/2021 lacosamide 10 mg/ml solution  600 30 Azucena Kuba (MD) [id number] Insurance [facility]   A N N  06/06/2021 08/15/2021 lacosamide 10 mg/ml solution  600 30 Azucena Kuba (MD) [id number] [facility]   A N N  07/13/2021 07/14/2021 lacosamide 10 mg/ml solution  600 30 Azucena Kuba (MD) [id number] Insurance [facility]   A N N  06/06/2021 07/05/2021 lacosamide 10 mg/ml solution  200 10 Azucena Kuba (MD) [id number] [facility]   A N N  06/06/2021 06/27/2021 lacosamide 10 mg/ml solution  200 10 Azucena Kuba (MD) [id number] [facility]   A N N  06/06/2021 06/17/2021 lacosamide 10 mg/ml solution  200 10 Azucena Kuba (MD) [id number] [facility]   A N N  06/06/2021 06/06/2021 lacosamide 10 mg/ml solution  200 10 Azucena Kuba (MD) [id number] Insurance [facility]   A N N  11/29/2020 05/26/2021 lacosamide 10 mg/ml  solution  200 10 Azucena Kuba (MD) [id number] Insurance [facility]   A N N  11/29/2020 05/18/2021 lacosamide 10 mg/ml solution  200 10 Azucena Kuba (MD) [id number] Insurance [facility]   A N N  11/29/2020 05/04/2021 lacosamide 10 mg/ml solution  200 10 Azucena Kuba (MD) [id number] [facility]   A N N  11/29/2020 04/20/2021 lacosamide 10 mg/ml solution  200 10 Azucena Kuba (MD) [id number] [facility]   A N N  11/29/2020 04/08/2021 lacosamide 10 mg/ml solution  200 10 Azucena Kuba (MD) [id number] [facility]   A N N  11/29/2020 03/31/2021 lacosamide 10 mg/ml solution  200 10 Azucena Kuba (MD) [id number] [facility]   A N N B 03/30/2021 03/31/2021 clonazepam 1 mg tablet  10 5 Palma Holter, [place] FNP-Bc [id number] [facility]   A N N  11/29/2020 03/18/2021 lacosamide 10 mg/ml solution  200 10 Azucena Kuba (MD) [id number] [facility]     Showing 1 to 20 of 20 entries     * - Details of Drug Type : O = Opioid, B = Benzodiazepine, S = Stimulant   * - Drugs marked with an asterisk are compound drugs. If the compound drug is made up of more than one controlled substance, then each controlled substance will be a separate row in the table.

## 2022-03-17 ENCOUNTER — Ambulatory Visit: Payer: Medicare Other | Attending: Physician Assistant | Admitting: Physician Assistant

## 2022-03-17 ENCOUNTER — Other Ambulatory Visit: Payer: Self-pay

## 2022-03-17 ENCOUNTER — Ambulatory Visit
Admission: RE | Admit: 2022-03-17 | Discharge: 2022-03-17 | Disposition: A | Discharge status: Home / Self Care | Payer: Medicare Other | Source: Ambulatory Visit

## 2022-03-17 VITALS — BP 110/59 | HR 86 | Temp 97.2°F | Resp 24

## 2022-03-17 DIAGNOSIS — R509 Fever, unspecified: Secondary | ICD-10-CM

## 2022-03-17 DIAGNOSIS — Z20822 Contact with and (suspected) exposure to covid-19: Secondary | ICD-10-CM | POA: Insufficient documentation

## 2022-03-17 DIAGNOSIS — Z20828 Contact with and (suspected) exposure to other viral communicable diseases: Secondary | ICD-10-CM | POA: Insufficient documentation

## 2022-03-17 DIAGNOSIS — R918 Other nonspecific abnormal finding of lung field: Secondary | ICD-10-CM

## 2022-03-17 DIAGNOSIS — J189 Pneumonia, unspecified organism: Secondary | ICD-10-CM

## 2022-03-17 LAB — GRAM STAIN

## 2022-03-17 MED ORDER — LEVOFLOXACIN 750 MG PO TABS *I*
750.0000 mg | ORAL_TABLET | Freq: Every day | ORAL | 0 refills | Status: DC
Start: 2022-03-17 — End: 2022-03-19

## 2022-03-17 MED ORDER — ACETAMINOPHEN 650 MG/20.3 ML WRAPPED *I*
975.0000 mg | Freq: Once | Status: AC
Start: 2022-03-17 — End: 2022-03-17
  Administered 2022-03-17: 975 mg via ORAL

## 2022-03-17 NOTE — Telephone Encounter (Signed)
Patient mom called and would like to do a video visit she is concerned about his oxoygn levels.  She can be reaches at [phone]

## 2022-03-17 NOTE — Patient Instructions (Addendum)
PLEASE REVIEW ALL INSTRUCTIONS FOR DETAILS AND CONTENT CONTAINED IN THE DISCHARGE MATERIALS NOT COVERED AT DISCHARGE.    Thank you Angel Wells for coming to [facility] for your health care concerns.    You were seen today for increased nasal and tracheal secretions, low grade temperature and generalized discomfort    Testing sent for RSV, COVID-19 and influenza.  Your results will take about 1 to 2 days to be available.  Please check your MyChart for your results.    Chest x-ray shows signs of a left lower lobe pneumonia    Tracheal aspirate cultures were sent and I will defer to your primary care for management.    Please follow plan as discussed including:    Medications:   An antibiotic called levaquin was prescribed for you today.  Please make sure to finish the entire course.  Please take a probiotic or yogurt daily.    Continue home nebulizer treatments    Continue shaker vest, cupping and tracheal suctioning    Other: Rest, hydration, honey for cough (if older than 1 year)    Please monitor for worsening illness, worsening productive cough, fever, chills, shortness of breath/trouble breathing, chest pain.  If you have any concerns or worsening symptoms please call your doctor immediately or return for further evaluation/call 911.    If your condition changes and/or worsens please follow up with your primary doctor and/or return to the urgent care center.    In the event of an emergency please dial 911

## 2022-03-17 NOTE — Addendum Note (Signed)
Addended by: Ayvion Kavanagh on: 03/17/2022 04:03 PM     Modules accepted: Orders

## 2022-03-17 NOTE — Telephone Encounter (Signed)
Spoke with Angel Wells she says that this is for breakthrough seizures and pt has not had one recently but they do keep Clonopin incase he should have one. Their current tablets will expire at the end of the month, so they would like to have 10 tabs to replace.

## 2022-03-17 NOTE — Addendum Note (Signed)
Addended byOleh Genin on: 03/17/2022 10:19 AM     Modules accepted: Orders

## 2022-03-17 NOTE — Telephone Encounter (Signed)
Discussed the below with pt father. He says that he will make sure the QID airway clearance is being done and will call back himself if there are further concerns. Will keep appt next week unless pt doing remarkably better.

## 2022-03-17 NOTE — Telephone Encounter (Addendum)
Chart reviewed. CXR reviewed.   Noted "Attempted to contact primary care office but unfortunately no provider was available to discuss treatment plan for continuity of care" in the UC note but do not see any telephone calls on our end (nor did I receive any pages from the answering service).     Levofloxacin is a reasonable choice. If he doesn't improve readily, we can add doxycycline.    Please recommend QID airway clearance until antibiotics are completed and call over the weekend if any concerns.     Lastly: we should not be accepting calls from nursing staff without dad present, this has led to significant miscommunications (see my last note). Please reinforce this with [name]. '    I don't see these medication changes discussed at a recent office visit, will defer to PCP on their return regarding these.     [name], MD

## 2022-03-17 NOTE — Telephone Encounter (Signed)
Spoke with hone nurse [name], says that dad would like to get a CXR for pt and a video visit. Has sputum specimen if needed.  Sat 88% now 94% with 3L on vent. Normally on 1L when he is on the vent and can tolerate being on RA 12-16hrs/ day.   Has suctioned and found thick pale yellow secretions.  Poor appetite  Ronchi  Temp 98.6  No sick contacts.   COVID home test negative.    Denies fever, AMS, HA, or worsening sx from yesterday.     [name] says she would like to begin irrigation with NS by g tube today. Feels that this will help thin out secretions since pt is not drinking as much.   Advised that pt would likely need an inperson visit to assess what is needed vs video. We do not have appts at [facility] avail this day. Pt could be seen at [facility]. [name] says that father will not like that. Clarified that these are options available at [facility] today. [name] is agreeable to pt being seen early next week at [facility]. Discussed that this could be booked but if pt sx worsen than earlier in person eval is recommended at [facility] or ED depending on severity.   Would like to ask if CXR or sputum could be ordered prior to visit.       [name] also clarified medication concerns.   Clonopin expires on 9-21 will need 10 pills for replacement. (this is explained in refill request.)    Silace was decreased during last fill from 10ml TID to 10ml BID. Verified in chart review and pended updated order for provider to sign off on.       Metformin was also decreased during last refill request from to BID. Verified in chart review and pended updated order for provider to sign off on.
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
## 2022-03-17 NOTE — Telephone Encounter (Signed)
Writer sees that pt went to UC this day.

## 2022-03-18 LAB — COVID/INFLUENZA A & B/RSV NAAT (PCR)
COVID-19 NAAT (PCR): NEGATIVE
Influenza A NAAT (PCR): NEGATIVE
Influenza B NAAT (PCR): NEGATIVE
RSV NAAT (PCR): NEGATIVE

## 2022-03-19 ENCOUNTER — Telehealth: Payer: Self-pay | Admitting: Physician Assistant

## 2022-03-19 ENCOUNTER — Encounter: Payer: Self-pay | Admitting: Oncology

## 2022-03-19 LAB — AEROBIC CULTURE

## 2022-03-19 MED ORDER — LINEZOLID 600 MG PO TABS *I*
600.0000 mg | ORAL_TABLET | Freq: Two times a day (BID) | ORAL | 0 refills | Status: AC
Start: 2022-03-19 — End: 2022-03-26

## 2022-03-19 NOTE — Telephone Encounter (Signed)
Spoke with father.  Verified full name and date of birth the patient.  Notified positive staph in his sputum culture.  Unfortunately Levaquin is resistant.  Dad reports that he is still having secretions but is able to get them up.  He has had linezolid in the past which does appear sensitive.  I will have him stop Levaquin and switch to linezolid.  This may cause hypoglycemia so I recommended that we look for signs of hypoglycemia including increased fatigue, shaking, nausea, vomiting, sweating, or change in behavior.  I asked for his nurses to be on high alert for low sugars as this may be related to linezolid.  Father verbalized understanding and will discuss with nurses.  Loman Brooklyn, PA   03/19/2022 10:12 AM

## 2022-03-20 ENCOUNTER — Other Ambulatory Visit: Payer: Self-pay | Admitting: Oncology

## 2022-03-21 ENCOUNTER — Ambulatory Visit: Payer: Medicare Other | Attending: Primary Care | Admitting: Primary Care

## 2022-03-21 ENCOUNTER — Other Ambulatory Visit: Payer: Self-pay

## 2022-03-21 ENCOUNTER — Encounter: Payer: Self-pay | Admitting: Gastroenterology

## 2022-03-21 DIAGNOSIS — J45909 Unspecified asthma, uncomplicated: Secondary | ICD-10-CM | POA: Insufficient documentation

## 2022-03-21 DIAGNOSIS — J4540 Moderate persistent asthma, uncomplicated: Secondary | ICD-10-CM

## 2022-03-21 DIAGNOSIS — G40909 Epilepsy, unspecified, not intractable, without status epilepticus: Secondary | ICD-10-CM

## 2022-03-21 DIAGNOSIS — Z9911 Dependence on respirator [ventilator] status: Secondary | ICD-10-CM | POA: Insufficient documentation

## 2022-03-21 DIAGNOSIS — J181 Lobar pneumonia, unspecified organism: Secondary | ICD-10-CM | POA: Insufficient documentation

## 2022-03-21 NOTE — Progress Notes (Signed)
[facility] - Comprehensive Medication Management Note    Angel Wells is a 31 y.o. male who presents for follow-up visit with the clinical pharmacist. Method of visit: In Person. Patient was referred by PCP for comprehensive medication management in the setting of uncontrolled diabetes .  Patient's medications, preferred pharmacy and allergies were updated and marked as reviewed, as appropriate.                  There were no vitals filed for this visit.     Additional labs/vitals as documented in chart - reviewed all relevant objective info at time of visit.    Current Outpatient Medications   Medication Sig Dispense Refill    linezolid (ZYVOX) 600 MG tablet Take 1 tablet (600 mg total) by mouth every 12 hours for 7 days  for Pneumonia 14 tablet 0    clonazePAM (KLONOPIN) 1 mg tablet take 1 tablet by mouth twice daily as needed (crush for g-tube) for seizures; MAXIMUM DAILY DOSE 2 tabs 10 tablet 0    Adhesive Tape (PAPER TAPE 1"X10YD) TAPE Use as directed. Diagnosis code: G82.50 300 each 5    Catheter Syringes 60 ML MISC Use as directed. Diagnosis code: G82.50 5 each 5    metFORMIN (GLUCOPHAGE) 500 MG/5ML solution administer 5 mls (500 milligram total) via g tube 2 times daily (with meals) 473 mL 5    SILACE 150 MG/15ML syrup take 10 mls (100 milligram total) by mouth 2 times daily 900 mL 5    ipratropium (ATROVENT) 0.02 % nebulizer solution INHALE 2.5 MLS BY NEBULIZATION 4 TIMES DAILY AS NEEDED FOR WHEEZING 62.5 mL 5    fluticasone (FLONASE) 50 MCG/ACT nasal spray spray 1 spray into nostril daily 16 g 5    SENNA 8.6 MG tablet administer 2 tablets via g tube daily 180 tablet 5    calcium carbonate 650 mg tablet take 1 tablet (650 MILLIGRAM total) by mouth daily via g tube 30 tablet 5    levETIRAcetam (KEPPRA) 100 mg/mL solution give 15 MILLILITER by g-tube twice daily 2700 mL 5    TOUJEO MAX SOLOSTAR 300 UNIT/ML injection pen inject 100 units into the skin nightly discard each pen 56 days after first  use. 6 each 5    calcium carbonate (TUMS) 500 MG chewable tablet Administer 1 tablet (500 mg total) via G tube daily 30 tablet 11    bisacodyl (DULCOLAX) 10 mg suppository please give suppository every other day, please check with dad prior to giving 12 each 5    cetirizine (ZYRTEC) 10 mg tablet take 1 tablet (10 MILLIGRAM total) by mouth nightly 90 tablet 5    ACCU-CHEK AVIVA PLUS test strip USE 1 STRIP THREE TIMES DAILY. DX: E11.65 AND Z79.4 100 each 5    chlorhexidine (PERIDEX) 0.12 % solution use as directed 473 mL 5    lacosamide (VIMPAT) 10 mg/mL SOLN oral solution Take 10 MILLILITER per tube twice daily. ; MAXIMUM DAILY DOSE 20 MILLILITER 600 mL 5    guaifenesin (HUMIBID E) 400 mg TABS Take 1 tablet (400mg ) via g-tube as needed for increased secretions.  To be separate by 4 hours of scheduled dose. 60 tablet 0    ibuprofen (ADVIL,MOTRIN) 600 mg tablet take 1 tablet by mouth 4 times daily as needed for pain 30 tablet 11    sodium chloride 0.9 % nebulizer solution inhale 3 mls by nebulization as needed for wheezing mcaid id [id number] 120 mL 5    carbamide  peroxide (DEBROX) 6.5 % otic solution use 5 drops twice daily on the first 3 days of each month. 15 mL 5    nystatin (MYCOSTATIN) powder apply topically 4 times daily  to the following areas: neck, groin, axilla 60 g 0    generic DME Low rate alarms set at 4 breaths, high rate alarm set at 40 breaths, pressure support set at 13 cms 1 each 0    albuterol (PROVENTIL) (2.5 mg/38mL) 0.083% nebulizer solution Inhale 3 mLs (2.5 mg total) by nebulization 3 times daily  for Spasm of Lung Air Passages with vesting.  When ill, use four times a day with vesting. 1050 mL 3    B-D UF III MINI PEN NEEDLES 31G X 5 MM USE 3 (THREE) TIMES DAILY AS NEEDED. 100 each 5    budesonide (PULMICORT) 0.25 mg/59mL nebulizer suspension INHALE 2 ML (CONTENT OF 1 VIAL) VIA NEBULIZER 2 TIMES DAILY FOR ASTHMA 360 mL 5    generic DME Ventilator for home use with humidifier 1 each 0    oxygen  concentrator Use  as instructed 1 each 0    generic DME Item to dispense: Bivona silicone cuffed trach tube size 7 80 mm length  Instructions for use: Use as directed 2 each 5    Nebulizers (PARI PRONEB MAX NEBULIZER) device Pari pron neb or comperableUse as directed 1 each 0    generic DME Item to dispense: trach care kits  Instructions for use: use as directed 30 each 5    generic DME Item to dispense: trach ties  Instructions for use: use as directed 30 each 5    Gauze Pads & Dressings (SPLIT GAUZE DRAINAGE SPONGE) 4"X4" Use  as instructed. 30 each 5    generic DME Item to dispense: 2X2 gauze 30 each 5    generic DME Item to dispense: stationary pulse oximeter 1 each 0    generic DME Item to dispense: Purple Passey-Muir valve  Instructions for use: Use As Directed 1 each 5    generic DME Vent settings:  SIMV/PC/PS, RATE 10 IP 21. PS 13, PEEP 7, HIGH PRESSURE 50, LOW PRESSURE 5, HIGH RR 40, LOW RR14 1 each 0    suction catheter 14 Fr (ARGYLE) Needs 12 fr. Catheters. Use as directed for tracheal secretions 30 each 11    suction machine Instructions for use: use as directed for clearance of secretions 1 each 0    Oral Hygiene Products (Q-CARE COVERD YANKAUER/SUCTION) MISC Yankauer suction tip to be used when clearing oral secretions 4 each 11    generic DME .Cough Assist and related supplies. Use as directed 1 each 0    baclofen (LIORESAL) 10 mg tablet take 1 tablet (10 milligram total) by mouth 2 times daily 180 tablet 5    GLUCAGON EMERGENCY 1 MG injection kit Use as directed AS NEEDED for blood glucose < 70 1 each 0    generic DME Item to dispense: Multifunction Vent  Instructions for use: use 24 hours a day 1 each 0    insulin aspart (NOVOLOG FLEXPEN) 100 UNIT/ML injection pen inject sub-cutaneously before meals: nutritional bl: 10 units + one half (1/2) as follows: <70: hold bl, 70-200: bl, > 200: bl+2, >250: bl+4, >300: bl+6, >350: bl +8. alert md for bg <70 and >350. 45 each 5    generic DME Item to dispense:  Portable Suction Machine Repair or Replace  Instructions for use: Use As Directed 1 each 0    Accu-Chek  FastClix Lancets MISC 1 EACH BY MISC.(NON-DRUG COMBO ROUTE) ROUTE 3 (THREE) TIMES DAILY. E11.65 102 each 5    TOBI 300 MG/5ML nebulizer solution Inhale 5 mLs (300 mg total) by nebulization every 12 hours  for pseudomonas infection, trach/vented patient NEBULIZE 1 VIAL (300 MG) TWICE DAILY EVERY OTHER MONTH   DIAGNOSIS: E84.0 280 mL 5    valproate (VALPROIC ACID) 50 mg/mL syrup take 5 mls (250 milligram total) by mouth 3 times daily 1350 mL 5    nystatin (MYCOSTATIN) 100,000 Units/mL suspension Swish and swallow 5 mLs (500,000 units total) 4 times daily  for Candidiasis Fungal Infection of the Oropharynx Swish and hold in mouth before swallowing. 473 mL 0    generic DME Item to dispense: 2 Tracheostomies #7 Bivona Silicone Cuffed 80 mm Length  Instructions for use: use as directed 2 each 6    generic DME Item to dispense: hospital bed mattress Instructions for use: use as directed 1 each 0    generic DME Gel Overlay Mattress.  LON: 99, [id number]. Use as directed. G82.50, S06.9x9 1 each 0    generic DME Item to dispense: Large Volume Nebulizer Pump 50 psi  Instructions for use: use as directed 1 each 0    generic DME Non-sterile gloves size medium Use as directed. MCAID ID [id number] 2boxes/month 3 each 5    nystatin (MYCOSTATIN) 100000 UNIT/GM cream Apply topically 2 times daily  to the following areas: buttocks 15 g 11    sodium chloride 0.9% 0.9 % irrigation Irrigate with 250 mLs as directed 3 times daily 500 mL 0    syringe disposable (BD LUER-LOK) 10 ML Use as instructed for inflation of tracheostomy balloon. 5 each 11    famotidine (PEPCID) 20 mg tablet take 1 tablet by mouth 2 (two) times daily. ok to crush it and put it down tube if necessary 180 tablet 6    Nebulizer MISC Disposable nebulizer kits to be used inline with trach  MCAID ID [id number] 4 each 5    LACTULOSE 10 GM/15ML solution administer 30 mls (20 g  total) via g tube daily as needed (no stool in 48 hours) use as directed 946 mL 5    oxygen 1-2 L/min to keep O2 sat above 92%, may increase up to 4 lpm as needed.  Low saturation alarm 85%, high alarm off, low HR 50, High HR 200 1 each 0    generic DME Item to dispense: Stationary Continuous Oximeter  Instructions for use: Use at night time Dx: Z99.11 1 each 0    generic DME Cough assist machine repairs for: [employer] respironics cough assist model T70. 1 each 0    generic DME Repair, parts, and labor for coughing assist machine. MCAID CIN: [id number] 1 each 0    Misc. Devices (PULSE OXIMETER) MISC Pulse Oximeter with Supplies, LON: 99, [id number], J96.11, Z99.11 1 each 0    Syringe Disposable 60 ML MISC Use as directed 5 each 5    generic DME Adhesive tape 1" x 5 yards. Use as directed. Disp 3 box 3 each 5    generic DME Suction canister Use as directed.MCAID ID [id number] 4 each 5    generic DME Suction tubing Use as directed.MCAID ID [id number] 4 each 5    suction machine Instructions for use: Use as needed for secretions, LON: 99 months, [id number] 1 each 0    generic DME Pulse ox probe replacement tape. Use as directed.[id number], LON 99 15 each 5  generic DME Pulse ox sensors (disposable)Use as directed.MCAID ID [id number], LON 99 4 each 5    generic DME Hill Rom Percussion Vest Model # 105  55inch  Use as directed. 1 each 0    Cholecalciferol 25 MCG (1000 UT) CHEW Take 1,000 units by mouth daily  2 chews daily      Non-System Medication Ok to change Gtube at home by nursing staff every 3 months and PRN 1 each 0    MIC-KEY gastrostomy kit 14FR Please dispense every 3 months and prn, MicKey 14Fr 2.5cm 1 kit 3    EPINEPHrine (EPIPEN) 0.3 mg/0.3 mL auto-injector USE AS DIRECTED 2 each 0    lactobacillus rhamnosus, GG, (CULTURELLE) capsule Take 1 capsule (1 each total) by mouth daily 30 capsule 5    triamcinolone (KENALOG) 0.025 % cream Apply topically 2 times daily  APPLY TO AFFECTED AREA (Patient not taking: Reported on  03/30/2021) 15 g 2    zinc oxide (DESITIN) 40 % paste Apply topically as needed for Diaper Rash (Patient not taking: Reported on 09/15/2021) 57 g 1    generic DME TRACH TIES WITH O2 PORT. Use as directed.MCAID ID [id number] 30 each 11    MIC-KEY gastrostomy feeding tube extension set 12" Use as directed with gtube 1 kit 11    generic DME Adult size trach ties Use as directed. 30 each 5    generic DME [employer] for use in wheelchair, dispense 1. Use as directed. Dx G80.0, S06.9x9, LON: lifetime 1 each 0    MIC-KEY gastrostomy feeding tube extension set 24" Use as directed, please dispense 4 per month 4 kit 5    gauze pads 2"X2" Use as directed around trach. MCAID ID [id number] 100 each 5    Nebulizers (PARI LC PLUS VIOS PRO NEB) MISC Pari Vios Pro Neb or comparable with Nebulizer kits and nebulizer trach mask. Use twice daily With albuterol.MCAID # [id number], MCARE [id number] 1 each 0    generic DME 15mm Adapter for trach to be used as directed. Replace weekly. Dx.Z99.11 4 each 5    generic DME 15 mm adapters for nebulized therapies via trach  Medicare ID [id number], Medicaid ID [id number] 4 each 11    nebulizer device with mask and tubing Use as directed 1 kit 0    generic DME Please dispense one 50mL syringe. Use as directed. (Patient not taking: Reported on 03/30/2021) 1 each 5    generic DME MCAID ID [id number], BIVONA CUFFED TRACH 7.0 Please change trach every month and as needed. 1 each 5    generic DME Please dispense 30mL measuring cup. Use as directed. 1 each 5    Gauze Pads & Dressings (SPLIT GAUZE DRAINAGE SPONGE) 4"X4" Use once and PRN times a day as instructed. 30 each 5    generic DME 10 isothermal adult "antiflect" connector. MCAID [id number] Use as directed. 10 each 5    generic DME Suction catheter with chimney valve.Use as directed.MCAID ID [id number] 1 each 0    gauze sponge (CURITY) 4"X4" pads Use PRN as instructed.MCAID ID [id number] 50 each 5    generic DME PASSEY MUIR VALVE (PURPLE) Use as  tolerated.MCAID ID [id number] 1 each 3    Adhesive Tape 1"x5yd TAPE By 1 Units no specified route as needed MCAID ID [id number] 3 each 5    generic DME 10 ml syringes Use as directed.MCAID ID [id number] 2 each 5    generic DME Trach care kits Use as directed.MCAID ID [id number] 30  each 5    surgical lubricant (SURGILUBE) gel Apply topically as needed MCAID ID [id number] 60 g 5    Distilled Water LIQD Distilled water, use as directedMCAID ID [id number] 10 Bottle 5    generic DME Mepilex 4X 4. Use as directed.MCAID ID [id number] 1 each 5    generic DME HME for trach humidification Use as directed.MCAID ID [id number] 30 each 5    generic DME Cough Assist circuits Use as directed.MCAID ID [id number] 1 each 5    generic DME Suction filter Use as directed.MCAID ID [id number] 2 each 5    generic DME Nasal adapters Use as directed.MCAID ID [id number] 5 each 5    diphenhydrAMINE (BENADRYL) 25 MG oral solid 25 mg by Per G Tube route nightly as needed for Sleep (Patient not taking: Reported on 03/30/2021)      FIBER SELECT GUMMIES PO Take by mouth Give 2 gummies daily      Multiple Vitamins-Minerals (HM MULTIVITAMIN ADULT GUMMY PO) Take by mouth Give 2 gummies daily      Ascorbic Acid (VITAMIN C ADULT GUMMIES PO) Take 250 mg by mouth daily  Give 2 gummies once daily       oxygen 2 l/m bleed through ventilator at night 1 each 0    Blood Glucose Monitoring Suppl (FIFTY50 GLUCOSE METER 2.0) w/Device KIT by Other route      miconazole (MICATIN) 2 % powder 2 times daily as needed       insulin pen needle (FIFTY50 PEN NEEDLES) 31G X 8 MM 1 each       No current facility-administered medications for this visit.         Assessment and Plan:  With regards to medication management for this patient, the following aspects were reviewed: Indication, effectiveness, safety and convenience of each medication. The patient's medical conditions and medications were assessed, evaluated, and deemed meeting goals of drug therapy except as listed below:      1. Diabetes  Current diabetes regimen: Lispro 10u TID w/ meals+ Toujeo 90u nightly + Metformin 1000mg  BID  Toujeo was reduced to 85 units nightly in March  BG's have been doing well, FPG's < 125mg /dL and pre-meal BG's 180mg /dL  No changes at this time due for an A1c. Discussed the possibility of linezolid causing hypoglycemia, they will monitor  Cleaned up medication list/Pended orders for provider per nursing's request      2. Other Medication-Related Interventions: In addition to the medication changes noted above, these medication-related interventions were completed by the Pharmacist during today's visit:  disease state education    This report and pended medication orders were sent to Dr. Windell Moulding for review and final approval.     Patient was able to verbalize understanding and repeat back key educational points.     Follow-up:  next follow up appt w/ PCP      Patient Status: Open to [facility]     Duration of Visit: 16-30 min    Thank you for allowing me to participate in the care of this patient. Please contact me with any questions.     Abram Sander, PharmD., [facility]  Ambulatory Care Clinical Pharmacy Specialist  [facility]  [address] [place], [place] [address]  [phone]

## 2022-03-22 ENCOUNTER — Telehealth: Payer: Self-pay | Admitting: Primary Care

## 2022-03-22 DIAGNOSIS — Z930 Tracheostomy status: Secondary | ICD-10-CM | POA: Insufficient documentation

## 2022-03-22 MED ORDER — SILACE 150 MG/15ML PO LIQD
100.0000 mg | Freq: Two times a day (BID) | ORAL | 5 refills | Status: DC
Start: 2022-03-22 — End: 2022-03-23

## 2022-03-22 MED ORDER — IPRATROPIUM BROMIDE 0.02 % IN SOLN *I*
RESPIRATORY_TRACT | 5 refills | Status: DC
Start: 2022-03-22 — End: 2022-05-17

## 2022-03-22 MED ORDER — CLONAZEPAM 1 MG PO TABS *I*
ORAL_TABLET | ORAL | 0 refills | Status: DC
Start: 2022-03-22 — End: 2023-01-04

## 2022-03-22 MED ORDER — SALINE NASAL SPRAY 0.65 % NA SOLN *WRAPPED*
1.0000 | Freq: Two times a day (BID) | NASAL | 5 refills | Status: AC | PRN
Start: 2022-03-22 — End: ?

## 2022-03-22 MED ORDER — CALCIUM CARBONATE 1250 MG/5ML PO SUSP WRAPPED *I*
750.0000 mg | Freq: Every day | ORAL | 5 refills | Status: DC
Start: 2022-03-22 — End: 2022-11-15

## 2022-03-22 MED ORDER — ALBUTEROL SULFATE (2.5 MG/3ML) 0.083% IN NEBU *I*
2.5000 mg | INHALATION_SOLUTION | Freq: Four times a day (QID) | RESPIRATORY_TRACT | 3 refills | Status: DC
Start: 2022-03-22 — End: 2022-03-31

## 2022-03-22 MED ORDER — METFORMIN HCL 500 MG/5ML PO SOLN *I*
1000.0000 mg | Freq: Two times a day (BID) | ORAL | 5 refills | Status: DC
Start: 2022-03-22 — End: 2022-04-03

## 2022-03-22 NOTE — Progress Notes (Signed)
[facility]    OFFICE VISIT NOTE  SUBJECTIVE   Left lower lobe pneumonia  Here to follow-up on recent urgent care visit where he was diagnosed with left lower lobe pneumonia.  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  Father and his nursing staff has also noticed that he has been requiring low level of oxygen during the day which is not baseline for him.  Evaluation in urgent care showed left lower lobe infiltrate and he was initially started on Levaquin.  Family received a call from urgent care after the results of his sputum culture and he was switched to Linezolid (sputum grew MRSA)  He is only received 3 doses of Linezolid-per his private duty nurse has continued to have some cough and low-level oxygen needs but has otherwise been stable  At baseline the patient has asthma for which she is on nebulized Budesonide.  Also uses as needed albuterol and DuoNebs along with CoughAssist device and vest treatments      Current Outpatient Medications   Medication Sig    Docusate Sodium (SILACE) 150 MG/15ML syrup Administer 10 mLs (100 mg total) via G tube 3 times daily  for Constipation    calcium carbonate 250 mg/mL (100 mg/mL elemental calcium) suspension Administer 3 mLs (750 mg total) via G tube daily    sodium chloride (OCEAN) 0.65 % nasal spray Spray 1 spray into each nostril 2 times daily as needed for Congestion    metFORMIN (GLUCOPHAGE) 500 MG/5ML solution Administer 10 mLs (1,000 mg total) via G tube 2 times daily  for Type 2 Diabetes    clonazePAM (KLONOPIN) 1 mg tablet take 1 tablet by mouth twice daily as needed (crush for g-tube) for seizures; MAXIMUM DAILY DOSE 2 tabs    ipratropium (ATROVENT) 0.02 % nebulizer solution INHALE 2.5 MLS BY NEBULIZATION 4 TIMES DAILY AS NEEDED FOR WHEEZING    albuterol (PROVENTIL) (2.5 mg/54mL) 0.083% nebulizer solution  Inhale 3 mLs (2.5 mg total) by nebulization 4 times daily  for Spasm of Lung Air Passages with vesting.  When ill, use four times a day with vesting.    linezolid (ZYVOX) 600 MG tablet Take 1 tablet (600 mg total) by mouth every 12 hours for 7 days  for Pneumonia    Adhesive Tape (PAPER TAPE 1"X10YD) TAPE Use as directed. Diagnosis code: G82.50    Catheter Syringes 60 ML MISC Use as directed. Diagnosis code: G82.50    fluticasone (FLONASE) 50 MCG/ACT nasal spray spray 1 spray into nostril daily    SENNA 8.6 MG tablet administer 2 tablets via g tube daily    calcium carbonate 650 mg tablet take 1 tablet (650 MILLIGRAM total) by mouth daily via g tube    levETIRAcetam (KEPPRA) 100 mg/mL solution give 15 MILLILITER by g-tube twice daily    TOUJEO MAX SOLOSTAR 300 UNIT/ML injection pen inject 100 units into the skin nightly discard each pen 56 days after first use.    calcium carbonate (TUMS) 500 MG chewable tablet Administer 1 tablet (500 mg total) via G tube daily    bisacodyl (DULCOLAX) 10 mg suppository please give suppository every other day, please check with dad prior to giving    cetirizine (ZYRTEC) 10 mg tablet take 1 tablet (10 MILLIGRAM total) by mouth nightly    ACCU-CHEK AVIVA PLUS test strip USE 1 STRIP THREE TIMES  DAILY. DX: E11.65 AND Z79.4    chlorhexidine (PERIDEX) 0.12 % solution use as directed    lacosamide (VIMPAT) 10 mg/mL SOLN oral solution Take 10 MILLILITER per tube twice daily. ; MAXIMUM DAILY DOSE 20 MILLILITER    guaifenesin (HUMIBID E) 400 mg TABS Take 1 tablet (400mg ) via g-tube as needed for increased secretions.  To be separate by 4 hours of scheduled dose.    ibuprofen (ADVIL,MOTRIN) 600 mg tablet take 1 tablet by mouth 4 times daily as needed for pain    sodium chloride 0.9 % nebulizer solution inhale 3 mls by nebulization as needed for wheezing mcaid id [id number]    carbamide peroxide (DEBROX) 6.5 % otic solution use 5 drops twice daily on the first 3 days of each month.    nystatin  (MYCOSTATIN) powder apply topically 4 times daily  to the following areas: neck, groin, axilla    generic DME Low rate alarms set at 4 breaths, high rate alarm set at 40 breaths, pressure support set at 13 cms    B-D UF III MINI PEN NEEDLES 31G X 5 MM USE 3 (THREE) TIMES DAILY AS NEEDED.    budesonide (PULMICORT) 0.25 mg/41mL nebulizer suspension INHALE 2 ML (CONTENT OF 1 VIAL) VIA NEBULIZER 2 TIMES DAILY FOR ASTHMA    generic DME Ventilator for home use with humidifier    oxygen concentrator Use  as instructed    generic DME Item to dispense: Bivona silicone cuffed trach tube size 7 80 mm length  Instructions for use: Use as directed    Nebulizers (PARI PRONEB MAX NEBULIZER) device Pari pron neb or comperableUse as directed    generic DME Item to dispense: trach care kits  Instructions for use: use as directed    generic DME Item to dispense: trach ties  Instructions for use: use as directed    Gauze Pads & Dressings (SPLIT GAUZE DRAINAGE SPONGE) 4"X4" Use  as instructed.    generic DME Item to dispense: 2X2 gauze    generic DME Item to dispense: stationary pulse oximeter    generic DME Item to dispense: Purple Passey-Muir valve  Instructions for use: Use As Directed    generic DME Vent settings:  SIMV/PC/PS, RATE 10 IP 21. PS 13, PEEP 7, HIGH PRESSURE 50, LOW PRESSURE 5, HIGH RR 40, LOW RR14    suction catheter 14 Fr (ARGYLE) Needs 12 fr. Catheters. Use as directed for tracheal secretions    suction machine Instructions for use: use as directed for clearance of secretions    Oral Hygiene Products (Q-CARE COVERD YANKAUER/SUCTION) MISC Yankauer suction tip to be used when clearing oral secretions    generic DME .Cough Assist and related supplies. Use as directed    baclofen (LIORESAL) 10 mg tablet take 1 tablet (10 milligram total) by mouth 2 times daily    GLUCAGON EMERGENCY 1 MG injection kit Use as directed AS NEEDED for blood glucose < 70    generic DME Item to dispense: Multifunction Vent  Instructions for use:  use 24 hours a day    insulin aspart (NOVOLOG FLEXPEN) 100 UNIT/ML injection pen inject sub-cutaneously before meals: nutritional bl: 10 units + one half (1/2) as follows: <70: hold bl, 70-200: bl, > 200: bl+2, >250: bl+4, >300: bl+6, >350: bl +8. alert md for bg <70 and >350.    generic DME Item to dispense: Portable Suction Machine Repair or Replace  Instructions for use: Use As Directed    Accu-Chek FastClix Lancets MISC 1  EACH BY MISC.(NON-DRUG COMBO ROUTE) ROUTE 3 (THREE) TIMES DAILY. E11.65    TOBI 300 MG/5ML nebulizer solution Inhale 5 mLs (300 mg total) by nebulization every 12 hours  for pseudomonas infection, trach/vented patient NEBULIZE 1 VIAL (300 MG) TWICE DAILY EVERY OTHER MONTH   DIAGNOSIS: E84.0    valproate (VALPROIC ACID) 50 mg/mL syrup take 5 mls (250 milligram total) by mouth 3 times daily    nystatin (MYCOSTATIN) 100,000 Units/mL suspension Swish and swallow 5 mLs (500,000 units total) 4 times daily  for Candidiasis Fungal Infection of the Oropharynx Swish and hold in mouth before swallowing.    generic DME Item to dispense: 2 Tracheostomies #7 Bivona Silicone Cuffed 80 mm Length  Instructions for use: use as directed    generic DME Item to dispense: hospital bed mattress Instructions for use: use as directed    generic DME Gel Overlay Mattress.  LON: 99, [id number]. Use as directed. G82.50, S06.9x9    generic DME Item to dispense: Large Volume Nebulizer Pump 50 psi  Instructions for use: use as directed    generic DME Non-sterile gloves size medium Use as directed. MCAID ID [id number] 2boxes/month    nystatin (MYCOSTATIN) 100000 UNIT/GM cream Apply topically 2 times daily  to the following areas: buttocks    sodium chloride 0.9% 0.9 % irrigation Irrigate with 250 mLs as directed 3 times daily    syringe disposable (BD LUER-LOK) 10 ML Use as instructed for inflation of tracheostomy balloon.    famotidine (PEPCID) 20 mg tablet take 1 tablet by mouth 2 (two) times daily. ok to crush it and put it  down tube if necessary    Nebulizer MISC Disposable nebulizer kits to be used inline with trach  MCAID ID [id number]    LACTULOSE 10 GM/15ML solution administer 30 mls (20 g total) via g tube daily as needed (no stool in 48 hours) use as directed    oxygen 1-2 L/min to keep O2 sat above 92%, may increase up to 4 lpm as needed.  Low saturation alarm 85%, high alarm off, low HR 50, High HR 200    generic DME Item to dispense: Stationary Continuous Oximeter  Instructions for use: Use at night time Dx: Z99.11    generic DME Cough assist machine repairs for: [employer] respironics cough assist model T70.    generic DME Repair, parts, and labor for coughing assist machine. MCAID CIN: [id number]    Misc. Devices (PULSE OXIMETER) MISC Pulse Oximeter with Supplies, LON: 99, [id number], J96.11, Z99.11    Syringe Disposable 60 ML MISC Use as directed    generic DME Adhesive tape 1" x 5 yards. Use as directed. Disp 3 box    generic DME Suction canister Use as directed.MCAID ID [id number]    generic DME Suction tubing Use as directed.MCAID ID [id number]    suction machine Instructions for use: Use as needed for secretions, LON: 99 months, [id number]    generic DME Pulse ox probe replacement tape. Use as directed.[id number], LON 99    generic DME Pulse ox sensors (disposable)Use as directed.MCAID ID [id number], LON 99    generic DME [place] Percussion Vest Model # [id number]  Use as directed.    Cholecalciferol 25 MCG (1000 UT) CHEW Take 1,000 units by mouth daily  2 chews daily    Non-System Medication Ok to change Gtube at home by nursing staff every 3 months and PRN    MIC-KEY gastrostomy kit 14FR Please dispense every 3 months and prn,  MicKey 14Fr 2.5cm    EPINEPHrine (EPIPEN) 0.3 mg/0.3 mL auto-injector USE AS DIRECTED    lactobacillus rhamnosus, GG, (CULTURELLE) capsule Take 1 capsule (1 each total) by mouth daily    triamcinolone (KENALOG) 0.025 % cream Apply topically 2 times daily  APPLY TO AFFECTED AREA (Patient not taking: Reported on  03/30/2021)    zinc oxide (DESITIN) 40 % paste Apply topically as needed for Diaper Rash (Patient not taking: Reported on 09/15/2021)    generic DME TRACH TIES WITH O2 PORT. Use as directed.MCAID ID [id number]    MIC-KEY gastrostomy feeding tube extension set 12" Use as directed with gtube    generic DME Adult size trach ties Use as directed.    generic DME Butterfly Harness for use in wheelchair, dispense 1. Use as directed. Dx G80.0, S06.9x9, LON: lifetime    MIC-KEY gastrostomy feeding tube extension set 24" Use as directed, please dispense 4 per month    gauze pads 2"X2" Use as directed around trach. MCAID ID [id number]    Nebulizers (PARI LC PLUS VIOS PRO NEB) MISC Pari Vios Pro Neb or comparable with Nebulizer kits and nebulizer trach mask. Use twice daily With albuterol.MCAID # [id number], MCARE [id number]    generic DME 15mm Adapter for trach to be used as directed. Replace weekly. Dx.Z99.11    generic DME 15 mm adapters for nebulized therapies via trach  Medicare ID [id number], Medicaid ID [id number]    nebulizer device with mask and tubing Use as directed    generic DME Please dispense one 50mL syringe. Use as directed. (Patient not taking: Reported on 03/30/2021)    generic DME MCAID ID [id number], BIVONA CUFFED TRACH 7.0 Please change trach every month and as needed.    generic DME Please dispense 30mL measuring cup. Use as directed.    Gauze Pads & Dressings (SPLIT GAUZE DRAINAGE SPONGE) 4"X4" Use once and PRN times a day as instructed.    generic DME 10 isothermal adult "antiflect" connector. MCAID [id number] Use as directed.    generic DME Suction catheter with chimney valve.Use as directed.MCAID ID [id number]    gauze sponge (CURITY) 4"X4" pads Use PRN as instructed.MCAID ID [id number]    generic DME PASSEY MUIR VALVE (PURPLE) Use as tolerated.MCAID ID [id number]    Adhesive Tape 1"x5yd TAPE By 1 Units no specified route as needed MCAID ID [id number]    generic DME 10 ml syringes Use as directed.MCAID ID [id number]     generic DME Trach care kits Use as directed.MCAID ID [id number]    surgical lubricant (SURGILUBE) gel Apply topically as needed MCAID ID [id number]    Distilled Water LIQD Distilled water, use as directedMCAID ID [id number]    generic DME Mepilex 4X 4. Use as directed.MCAID ID [id number]    generic DME HME for trach humidification Use as directed.MCAID ID [id number]    generic DME Cough Assist circuits Use as directed.MCAID ID [id number]    generic DME Suction filter Use as directed.MCAID ID [id number]    generic DME Nasal adapters Use as directed.MCAID ID [id number]    diphenhydrAMINE (BENADRYL) 25 MG oral solid 25 mg by Per G Tube route nightly as needed for Sleep (Patient not taking: Reported on 03/30/2021)    FIBER SELECT GUMMIES PO Take by mouth Give 2 gummies daily    Multiple Vitamins-Minerals (HM MULTIVITAMIN ADULT GUMMY PO) Take by mouth Give 2 gummies daily    Ascorbic Acid (VITAMIN C ADULT GUMMIES PO) Take 250 mg by mouth daily  Give 2 gummies  once daily     oxygen 2 l/m bleed through ventilator at night    Blood Glucose Monitoring Suppl (FIFTY50 GLUCOSE METER 2.0) w/Device KIT by Other route    miconazole (MICATIN) 2 % powder 2 times daily as needed     insulin pen needle (FIFTY50 PEN NEEDLES) 31G X 8 MM 1 each       Medications reviewed at today's visit    OBJECTIVE   PHYSICAL EXAM  Oxygen saturation 90% on room air  GEN:  Alert, no acute distress  HEENT: Tracheostomy in place there is no obvious tracheal secretions though Passy-Muir valve is in place  CV: RRR, normal S1/S2, no murmurs   PULM: no increased work of breathing, he has soft diffuse expiratory wheezing throughout all lung fields.  I am unable to appreciate any focal findings     CXR PA and lateral (images personally reviewed)  Scattered patchy areas in the left lower lobe      ASSESSMENT       ICD-10-CM ICD-9-CM   1. Lobar pneumonia  J18.1 481   2. 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  At this point recommended continuing and completing the course of Linezolid as recommended by urgent care.  Recommended continued aggressive airway regimen using both vest and CoughAssist device.  Can suction trach as needed  He has diffuse wheezing on exam suggesting a mild exacerbation of his asthma-I spoke with his father who is hesitant to treat with Prednisone as he feels this has increased Angel Wells's seizures in the past.  For now suggested continued nebulized Budesonide twice daily and around-the-clock bronchodilators  Suggested oxygen via trach mask at 1 to 2 L during the day and 2 to 3 L through his vent at night to keep oxygen saturations greater than 92 %  We will follow-up by phone in the next few days and, if wheezing and oxygen needs have persisted, we will consider course of Prednisone at that time      Orders Placed This Encounter    calcium carbonate 250 mg/mL (100 mg/mL elemental calcium) suspension    sodium chloride (OCEAN) 0.65 % nasal spray    metFORMIN (GLUCOPHAGE) 500 MG/5ML solution    clonazePAM (KLONOPIN) 1 mg tablet    ipratropium (ATROVENT) 0.02 % nebulizer solution    albuterol (PROVENTIL) (2.5 mg/49mL) 0.083% nebulizer solution       Marchia Meiers, MD

## 2022-03-22 NOTE — Telephone Encounter (Signed)
Patient was seen yesterday in the office to follow-up on left lower lobe pneumonia  Was having lowish oxygen saturations and some diffuse wheezing    I recommended around-the-clock bronchodilators and continued antibiotics.  I had a discussion with his father Angel Wells) about a course of Prednisone.  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  If he is continuing to have experience wheezing and lowish oxygen saturations will go ahead and add Prednisone

## 2022-03-23 MED ORDER — SILACE 150 MG/15ML PO LIQD
100.0000 mg | Freq: Three times a day (TID) | ORAL | 5 refills | Status: DC
Start: 2022-03-23 — End: 2022-06-23

## 2022-03-23 NOTE — Telephone Encounter (Signed)
Called and spoke with patient's dad  Patient is doing better  Hasn't seen patient this morning but was improving as of last night  Agreed for writer to reach out to Melody Comas, Dispensing optician and spoke with Eunice Blase  Patient is improving  Wheezing has decreased  VSS   O2 97%  No fevers  Secretions are now white and thin   Still doing nebs  Agrees patient does not need to start prednisone   Was told by pharmacy that Linezolid has interaction with calcium carbonate- holding until antibiotic is completed   Eunice Blase states there is an issue with sodium chloride nasal spray ordered    Called the pharmacy  No issues with nasal spray RX  Needs updated docusate RX- needs to say 3 times daily  Updated RX and sent to PCP    Leonard Schwartz, RN

## 2022-03-23 NOTE — Addendum Note (Signed)
Addended by: Forbes Cellar on: 03/23/2022 01:52 PM     Modules accepted: Orders

## 2022-03-23 NOTE — Addendum Note (Signed)
Addended by: Leonard Schwartz on: 03/23/2022 10:27 AM     Modules accepted: Orders

## 2022-03-24 ENCOUNTER — Encounter: Payer: Self-pay | Admitting: Gastroenterology

## 2022-03-25 ENCOUNTER — Other Ambulatory Visit: Payer: Self-pay | Admitting: Internal Medicine

## 2022-03-25 ENCOUNTER — Other Ambulatory Visit: Payer: Self-pay | Admitting: Oncology

## 2022-03-26 ENCOUNTER — Other Ambulatory Visit: Payer: Self-pay | Admitting: Pediatrics

## 2022-03-26 LAB — LEGIONELLA CULTURE
Legionella Culture: 0
Legionella Culture: 0

## 2022-03-27 ENCOUNTER — Encounter: Payer: Self-pay | Admitting: Primary Care

## 2022-03-27 ENCOUNTER — Encounter: Payer: Self-pay | Admitting: Oncology

## 2022-03-27 NOTE — Progress Notes (Signed)
Incoming paperwork for completion to the [facility]  Date Received: 03/27/2022    Type of paperwork:   Sender/organization: [employer]  Form type: Physician order    Any specific/special request? no    Copy and scan before mailing? No    Completed paperwork to be sent to:  Fax to [phone]    Placed in box for: Bing Plume  1:52 PM  03/27/2022

## 2022-03-28 MED ORDER — LINEZOLID 600 MG PO TABS *I*
600.0000 mg | ORAL_TABLET | Freq: Two times a day (BID) | ORAL | 0 refills | Status: AC
Start: 2022-03-28 — End: 2022-03-31

## 2022-03-29 ENCOUNTER — Telehealth: Payer: Self-pay | Admitting: Oncology

## 2022-03-29 ENCOUNTER — Encounter: Payer: Self-pay | Admitting: Student in an Organized Health Care Education/Training Program

## 2022-03-29 ENCOUNTER — Other Ambulatory Visit: Payer: Self-pay

## 2022-03-29 ENCOUNTER — Ambulatory Visit
Payer: Medicare Other | Attending: Student in an Organized Health Care Education/Training Program | Admitting: Student in an Organized Health Care Education/Training Program

## 2022-03-29 VITALS — BP 129/87 | HR 82 | Temp 96.0°F | Wt 176.0 lb

## 2022-03-29 DIAGNOSIS — J181 Lobar pneumonia, unspecified organism: Secondary | ICD-10-CM | POA: Insufficient documentation

## 2022-03-29 NOTE — Telephone Encounter (Signed)
[facility] TELEPHONE TRIAGE     Reason for call: [name] patients nurse called states stats are dropping, secretions are increasing and had to go up to 5 liters last night. She said she had permissin from dad to call us. She is requesting patient is seen this week.    Name of caller: [name]  Relationship to patient: Nurse  Organization (if applicable):   Phone:  [phone]  Fax (if applicable):

## 2022-03-29 NOTE — Telephone Encounter (Signed)
Called and spoke with [name], RN  Booked for inperson visit today at 1:30pm  No questions or concerns at this time    Leonard Schwartz, RN

## 2022-03-29 NOTE — Telephone Encounter (Signed)
When I saw him he was having symptoms of an asthma exacerbation.  We held off on Prednisone because Dad was hesitant.  When we spoke last week he was improved so we continued to hold off on Prednisone but definitely needs a visit to assess this and decide

## 2022-03-29 NOTE — Telephone Encounter (Signed)
I believe Dr. Windell Moulding extended his antibiotics for 3 more days yesterday.  He probably should be seen (not sure if he was seen over video or in person) - but would recommend in person evaluation

## 2022-03-29 NOTE — Telephone Encounter (Signed)
Called and spoke with Brett Canales, patient's dad  States patient's O2 dropping  No temp  Looks good overall  Need more antibiotics?   Gave permission for Clinical research associate to call [name], [employer] and spoke with [name], RN  O2 dropped into the 80's last night and patient required 5L   Baseline is vent and 1L overnight  Back at baseline today but doesn't seem himself  No fevers  Think secretions  Has been doing nebs, vesting, cough assist  Has 1 day left on abx   Wants an order for sputum culture and a visit today    Routing to PCP     Leonard Schwartz, RN

## 2022-03-30 NOTE — Progress Notes (Signed)
[facility]    Visit Note     REASON FOR VISIT      Chief Complaint   Patient presents with    Other     Hypoxemia          PRIMARY DIAGNOSIS      TBI    Subjective     SUBJECTIVE      Hypoxemia  - seen at urgent care on 9/7, diagnosed with pneumonia and started on levofloxacin  - culture results concerning for MRSA and was transitioned to linezolid on 9/10  - seen in [facility] with some concerns for asthma, but held off on prednisone  - was improving as per dad, then from Sunday to yesterday seemed to have worsened  - overnight requiring increased O2 requirements and reporting copious secretions  - denies fever, increased WOB, audible wheeze  - otherwise has been acting himself  - antibiotic course was also extended through Friday   - doing lung clearance 3x a day with albuterol/ipratropium and vesting  - having a lot of sinus congestion, associated with allergies this time of year. Taking an antihistamine         Review of Systems   Constitutional:  Negative for fever and malaise/fatigue.   HENT:  Positive for congestion.    Respiratory:  Positive for cough and sputum production. Negative for wheezing.    Gastrointestinal:  Negative for vomiting.   Neurological:  Negative for loss of consciousness.     Review of Systems as per HPI above    Past Medical History, Social History, Family History, and Medications/allergies reviewed during this visit    Current Outpatient Medications   Medication    cetirizine (ZYRTEC) 10 mg tablet    EPINEPHrine (EPIPEN) 0.3 mg/0.3 mL auto-injector    linezolid (ZYVOX) 600 MG tablet    guaifenesin (HUMIBID E) 400 mg TABS    Docusate Sodium (SILACE) 150 MG/15ML syrup    calcium carbonate 250 mg/mL (100 mg/mL elemental calcium) suspension    sodium chloride (OCEAN) 0.65 % nasal spray    metFORMIN (GLUCOPHAGE) 500 MG/5ML solution    clonazePAM (KLONOPIN) 1 mg tablet    ipratropium (ATROVENT) 0.02 % nebulizer solution    albuterol (PROVENTIL) (2.5 mg/15mL) 0.083% nebulizer  solution    Adhesive Tape (PAPER TAPE 1"X10YD) TAPE    Catheter Syringes 60 ML MISC    fluticasone (FLONASE) 50 MCG/ACT nasal spray    SENNA 8.6 MG tablet    calcium carbonate 650 mg tablet    levETIRAcetam (KEPPRA) 100 mg/mL solution    TOUJEO MAX SOLOSTAR 300 UNIT/ML injection pen    calcium carbonate (TUMS) 500 MG chewable tablet    bisacodyl (DULCOLAX) 10 mg suppository    ACCU-CHEK AVIVA PLUS test strip    chlorhexidine (PERIDEX) 0.12 % solution    lacosamide (VIMPAT) 10 mg/mL SOLN oral solution    ibuprofen (ADVIL,MOTRIN) 600 mg tablet    sodium chloride 0.9 % nebulizer solution    carbamide peroxide (DEBROX) 6.5 % otic solution    nystatin (MYCOSTATIN) powder    generic DME    B-D UF III MINI PEN NEEDLES 31G X 5 MM    budesonide (PULMICORT) 0.25 mg/20mL nebulizer suspension    generic DME    oxygen concentrator    generic DME    Nebulizers (PARI PRONEB MAX NEBULIZER) device    generic DME    generic DME    Gauze Pads & Dressings (SPLIT GAUZE DRAINAGE SPONGE) 4"X4"  generic DME    generic DME    generic DME    generic DME    suction catheter 14 Fr (ARGYLE)    suction machine    Oral Hygiene Products (Q-CARE COVERD YANKAUER/SUCTION) MISC    generic DME    baclofen (LIORESAL) 10 mg tablet    GLUCAGON EMERGENCY 1 MG injection kit    generic DME    insulin aspart (NOVOLOG FLEXPEN) 100 UNIT/ML injection pen    generic DME    Accu-Chek FastClix Lancets MISC    TOBI 300 MG/5ML nebulizer solution    valproate (VALPROIC ACID) 50 mg/mL syrup    nystatin (MYCOSTATIN) 100,000 Units/mL suspension    generic DME    generic DME    generic DME    generic DME    generic DME    nystatin (MYCOSTATIN) 100000 UNIT/GM cream    sodium chloride 0.9% 0.9 % irrigation    syringe disposable (BD LUER-LOK) 10 ML    famotidine (PEPCID) 20 mg tablet    Nebulizer MISC    LACTULOSE 10 GM/15ML solution    oxygen    generic DME    generic DME    generic DME    Misc. Devices (PULSE OXIMETER) MISC    Syringe Disposable 60 ML MISC    generic DME     generic DME    generic DME    suction machine    generic DME    generic DME    generic DME    Cholecalciferol 25 MCG (1000 UT) CHEW    Non-System Medication    MIC-KEY gastrostomy kit 14FR    lactobacillus rhamnosus, GG, (CULTURELLE) capsule    triamcinolone (KENALOG) 0.025 % cream    zinc oxide (DESITIN) 40 % paste    generic DME    MIC-KEY gastrostomy feeding tube extension set 12"    generic DME    generic DME    MIC-KEY gastrostomy feeding tube extension set 24"    gauze pads 2"X2"    Nebulizers (PARI LC PLUS VIOS PRO NEB) MISC    generic DME    generic DME    nebulizer device with mask and tubing    generic DME    generic DME    generic DME    Gauze Pads & Dressings (SPLIT GAUZE DRAINAGE SPONGE) 4"X4"    generic DME    generic DME    gauze sponge (CURITY) 4"X4" pads    generic DME    Adhesive Tape 1"x5yd TAPE    generic DME    generic DME    surgical lubricant (SURGILUBE) gel    Distilled Water LIQD    generic DME    generic DME    generic DME    generic DME    generic DME    diphenhydrAMINE (BENADRYL) 25 MG oral solid    FIBER SELECT GUMMIES PO    Multiple Vitamins-Minerals (HM MULTIVITAMIN ADULT GUMMY PO)    Ascorbic Acid (VITAMIN C ADULT GUMMIES PO)    oxygen    Blood Glucose Monitoring Suppl (FIFTY50 GLUCOSE METER 2.0) w/Device KIT    miconazole (MICATIN) 2 % powder    insulin pen needle (FIFTY50 PEN NEEDLES) 31G X 8 MM     No current facility-administered medications for this visit.          Objective     OBJECTIVE      BP 129/87   Pulse 82   Temp 35.6 C (96 F)   Wt  79.8 kg (176 lb)   SpO2 99%   BMI 29.29 kg/m     Physical Exam  Constitutional:       General: He is not in acute distress.     Appearance: He is not ill-appearing.   HENT:      Nose: Congestion and rhinorrhea present.      Mouth/Throat:      Mouth: Mucous membranes are moist.   Eyes:      Extraocular Movements: Extraocular movements intact.      Conjunctiva/sclera: Conjunctivae normal.   Cardiovascular:      Rate and Rhythm: Normal  rate.   Pulmonary:      Effort: Pulmonary effort is normal. No respiratory distress.      Breath sounds: No stridor. Rhonchi present. No wheezing.   Abdominal:      Palpations: Abdomen is soft.      Tenderness: There is no abdominal tenderness. There is no guarding.   Skin:     General: Skin is warm and dry.      Capillary Refill: Capillary refill takes less than 2 seconds.   Neurological:      Mental Status: He is alert. Mental status is at baseline.              ASSESSMENT / DIAGNOSIS     1. Lobar pneumonia  - without fever, respiratory distress, and appropriate culture data I feel he is on the correct antibiotic treatment at this time  - as he is not persistently hypoxemic, and his hypoxemia is occurring at night I suspect he has increased need for lung clearance with resolving pneumonia and upper airway secretions from seasonal allergies  - discussed alteration in lung clearance with clinic RT. Increase clearance to 4x daily and replace ipratropium with saline nebs  - minimal wheeze on exam, after discussion with patients father we will hold on systemic steroids pending clinical course  - continue linezolid antibiotics through Friday. I am concerned if he worsens he will need IV antibiotics and hospitalizations are difficult for him. We will follow up on Friday, and if clinically worsening discuss possible ED evaluation       Orders Placed This Encounter   No orders placed during this encounter.          --Patient instructed to call if symptoms are not improving or worsening  --Follow-up arranged  Follow up if symptoms worsen or fail to improve.     I personally spent 45 minutes on the calendar day of the encounter, including pre and post visit work reviewing the EMR and management of this patient.     Electronically signed by Zollie Scale, DO , 03/30/2022  [facility], Phone: [phone]

## 2022-03-31 ENCOUNTER — Telehealth: Payer: Self-pay | Admitting: Oncology

## 2022-03-31 ENCOUNTER — Encounter: Payer: Self-pay | Admitting: Oncology

## 2022-03-31 MED ORDER — ALBUTEROL SULFATE (2.5 MG/3ML) 0.083% IN NEBU *I*
2.5000 mg | INHALATION_SOLUTION | Freq: Four times a day (QID) | RESPIRATORY_TRACT | 3 refills | Status: DC
Start: 2022-03-31 — End: 2022-04-06

## 2022-03-31 NOTE — Progress Notes (Signed)
Paperwork completed 03/31/2022  Sent as outlined in prior documentation

## 2022-03-31 NOTE — Telephone Encounter (Signed)
Spoke with [name], RN  Patient is improving  No fevers  Thin secretions  O2 95% on RA  Spirits are good  Overall doing much better  Finished abx today  Will continue with 4x daily airway clearance through the weekend  [facility]  Pended and sent to PCP    Leonard Schwartz, RN

## 2022-03-31 NOTE — Telephone Encounter (Signed)
Concerns addressed in separate task  See telephone encounter for details    Leonard Schwartz, RN

## 2022-03-31 NOTE — Telephone Encounter (Signed)
[facility] TELEPHONE INTAKE    Reason for call: [name] would like to speak with nurse/PCP to discuss patient's last visit on Wednesday    Name of caller: [name]  Relationship to patient: Caregiver    Phone:  [phone]

## 2022-03-31 NOTE — Progress Notes (Signed)
Incoming paperwork for completion to the [facility]  Date Received: 03/31/2022    Type of paperwork:   Sender/organization: [employer]  Form type: Physician Orders (Saline/Albutorol)    Any specific/special request?     Copy and scan before mailing? Yes    Completed paperwork to be sent to:  Fax to [phone]    Placed in box for: Bertram Savin  1:08 PM  03/31/2022

## 2022-03-31 NOTE — Telephone Encounter (Signed)
Writer spoke to [employer], [place]. [name] inquired if Dr. Susette Racer was going to follow up with phone call today. [name] relayed she received previous notification she would receive follow up today via phone.   [phone]

## 2022-04-01 ENCOUNTER — Other Ambulatory Visit: Payer: Self-pay | Admitting: Pediatrics

## 2022-04-03 NOTE — Progress Notes (Signed)
Outgoing paperwork:    Completed paperwork received on  04/03/2022    Angel Wells  2:09 PM  04/03/2022

## 2022-04-05 MED ORDER — SILACE 150 MG/15ML PO LIQD
100.0000 mg | Freq: Three times a day (TID) | ORAL | 5 refills | Status: DC
Start: 2022-04-05 — End: 2022-06-23

## 2022-04-05 NOTE — Addendum Note (Signed)
Addended by: Jeani Sow on: 04/05/2022 08:31 AM     Modules accepted: Orders

## 2022-04-06 ENCOUNTER — Other Ambulatory Visit: Payer: Self-pay | Admitting: Oncology

## 2022-04-06 ENCOUNTER — Encounter: Payer: Self-pay | Admitting: Oncology

## 2022-04-06 DIAGNOSIS — Z8701 Personal history of pneumonia (recurrent): Secondary | ICD-10-CM

## 2022-04-06 DIAGNOSIS — J45909 Unspecified asthma, uncomplicated: Secondary | ICD-10-CM

## 2022-04-06 MED ORDER — ALBUTEROL SULFATE (2.5 MG/3ML) 0.083% IN NEBU *I*
2.5000 mg | INHALATION_SOLUTION | Freq: Four times a day (QID) | RESPIRATORY_TRACT | 3 refills | Status: DC
Start: 2022-04-06 — End: 2023-04-16

## 2022-04-06 NOTE — Progress Notes (Signed)
Incoming paperwork for completion to the [facility]  Date Received: 04/06/2022    Type of paperwork:   Sender/organization: [employer]  Form type: Physician Order - Addendum    Copy and scan before mailing? No    Completed paperwork to be sent to:  Fax to [phone]    Placed in box for: Sharmon Revere  3:55 PM  04/06/2022

## 2022-04-10 ENCOUNTER — Encounter: Payer: Self-pay | Admitting: Gastroenterology

## 2022-04-11 NOTE — Telephone Encounter (Signed)
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

## 2022-04-11 NOTE — Progress Notes (Signed)
Outgoing paperwork:    Completed paperwork received on  04/11/2022  Sent to scanning? Yes  Sent as outlined in prior documentation: Fax to [phone]    Sherron Ales  3:40 PM  04/11/2022

## 2022-04-24 ENCOUNTER — Encounter: Payer: Self-pay | Admitting: Gastroenterology

## 2022-04-26 ENCOUNTER — Encounter: Payer: Self-pay | Admitting: Oncology

## 2022-04-26 ENCOUNTER — Other Ambulatory Visit: Payer: Self-pay | Admitting: Oncology

## 2022-04-26 ENCOUNTER — Other Ambulatory Visit: Payer: Self-pay | Admitting: Pediatrics

## 2022-04-26 NOTE — Progress Notes (Signed)
Incoming paperwork for completion to the [facility]  Date Received: 04/26/2022    Type of paperwork:   Sender/organization: [employer] [place]  Form type: Home Health Certification & Plan of Care    Completed paperwork to be sent to:  Fax to [phone]    Placed in box for: Pricilla Loveless  3:05 PM  04/26/2022

## 2022-04-27 ENCOUNTER — Ambulatory Visit: Payer: Medicare Other | Attending: Oncology

## 2022-04-27 ENCOUNTER — Other Ambulatory Visit: Payer: Self-pay

## 2022-04-27 ENCOUNTER — Encounter: Payer: Self-pay | Admitting: Gastroenterology

## 2022-04-27 ENCOUNTER — Ambulatory Visit: Payer: Medicare Other

## 2022-04-27 DIAGNOSIS — Z23 Encounter for immunization: Secondary | ICD-10-CM | POA: Insufficient documentation

## 2022-04-27 NOTE — Progress Notes (Signed)
Treatments with cough assist should be performed with the rubber suction port closed on the elbow trach adapter .  Electronically signed by:  Leta Speller, RRT on 04/27/2022 at 2:58 PM

## 2022-04-27 NOTE — Progress Notes (Signed)
Flu Immunization     Angel Wells is here today for an influenza vaccine.     Influenza immunization reviewed and discussed.  Questions answered.    Verbal consent obtained.    No history of any of the following:                Severe allergy to eggs                 Severe allergy to Influenza vaccine                 History of Guillain-Barre Syndrome                Current illness or fever            Signed: Dorian Pod, RN on 04/27/2022 at 2:12 PM

## 2022-04-27 NOTE — Addendum Note (Signed)
Addended by: Otho Perl on: 04/27/2022 03:27 PM     Modules accepted: Orders

## 2022-04-28 ENCOUNTER — Telehealth: Payer: Self-pay | Admitting: Oncology

## 2022-04-28 NOTE — Telephone Encounter (Signed)
[facility] TELEPHONE INTAKE    Reason for call: [name] called and would like to schedule the patient for a COVID vaccination.  I explained to [name] that we do not have the COVID vaccination as of yet but she still wanted to schedule an appointment and if we don't get it she will cancel the appointment.      Name of caller: [name]  Relationship to patient: Care giver    Phone:  [phone]

## 2022-05-01 ENCOUNTER — Telehealth: Payer: Self-pay | Admitting: Oncology

## 2022-05-01 ENCOUNTER — Other Ambulatory Visit: Payer: Self-pay | Admitting: Oncology

## 2022-05-01 DIAGNOSIS — E1165 Type 2 diabetes mellitus with hyperglycemia: Secondary | ICD-10-CM

## 2022-05-01 MED ORDER — FIFTY50 GLUCOSE METER 2.0 W/DEVICE KIT
PACK | 0 refills | Status: DC
Start: 2022-05-01 — End: 2022-06-23

## 2022-05-01 NOTE — Telephone Encounter (Signed)
A user error has taken place: orders placed in error, not carried out on this patient.

## 2022-05-01 NOTE — Telephone Encounter (Signed)
Spoke with father he says that BG monitoring machine has broken. Says they have has this for a few years and would like to have a new one sent to the pharmacy for pick up. They do not have a preference on monitors.     Will route to PCP for review.

## 2022-05-01 NOTE — Progress Notes (Signed)
Outgoing paperwork:    Completed paperwork received on  05/01/2022  Sent to scanning? Yes  Sent as outlined in prior documentation: Fax to [phone]    [name]  3:55 PM  05/01/2022

## 2022-05-01 NOTE — Telephone Encounter (Signed)
[facility] TELEPHONE INTAKE    Reason for call: Patient mom calling requesting to order a new Blood Glucose monitor script, the machine patient has now is coming apart on the side.    Name of caller: Liborio Nixon  Relationship to patient: mother  Organization (if applicable):   Phone:  [phone]

## 2022-05-03 ENCOUNTER — Encounter: Payer: Self-pay | Admitting: Oncology

## 2022-05-03 NOTE — Progress Notes (Signed)
[facility]  Prior Authorization Request    Date of request: 05/03/2022  Time of call: 9:44 AM    Medication Requiring PA: TOUJEO MAX SOLOSTAR 300 UNIT/ML injection pen     Directions(dosing and frequency): inject 100 units into the skin nightly discard each pen 56 days after first use.     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

## 2022-05-05 ENCOUNTER — Other Ambulatory Visit: Payer: Self-pay | Admitting: Oncology

## 2022-05-05 DIAGNOSIS — Z794 Long term (current) use of insulin: Secondary | ICD-10-CM

## 2022-05-05 MED ORDER — BLOOD GLUCOSE MONITORING SUPPL DEVI *A*
0 refills | Status: DC
Start: 2022-05-05 — End: 2022-06-23

## 2022-05-08 MED ORDER — ACCU-CHEK GUIDE W/DEVICE KIT *A*
PACK | 0 refills | Status: DC
Start: 2022-05-08 — End: 2022-06-23

## 2022-05-08 NOTE — Addendum Note (Signed)
Addended by: Homero Fellers A on: 05/08/2022 09:43 AM     Modules accepted: Orders

## 2022-05-08 NOTE — Addendum Note (Signed)
Addended by: Jaymes Graff on: 05/08/2022 09:41 AM     Modules accepted: Orders

## 2022-05-10 ENCOUNTER — Other Ambulatory Visit: Payer: Self-pay | Admitting: Oncology

## 2022-05-10 DIAGNOSIS — E119 Type 2 diabetes mellitus without complications: Secondary | ICD-10-CM

## 2022-05-10 DIAGNOSIS — E1165 Type 2 diabetes mellitus with hyperglycemia: Secondary | ICD-10-CM

## 2022-05-10 DIAGNOSIS — Z794 Long term (current) use of insulin: Secondary | ICD-10-CM

## 2022-05-11 ENCOUNTER — Encounter: Payer: Self-pay | Admitting: Oncology

## 2022-05-11 MED ORDER — ACCU-CHEK GUIDE VI STRP *A*
ORAL_STRIP | 5 refills | Status: DC
Start: 2022-05-11 — End: 2022-12-11

## 2022-05-11 NOTE — Progress Notes (Signed)
Incoming paperwork for completion to the [facility]  Date Received: 05/11/2022     Type of paperwork:   Sender/organization: [employer]  Form type: Physician Order      Completed paperwork to be sent to:  Fax to [phone]     Placed in box for: Pricilla Loveless  10:44 AM  05/11/2022

## 2022-05-15 ENCOUNTER — Other Ambulatory Visit: Payer: Self-pay | Admitting: Oncology

## 2022-05-15 ENCOUNTER — Encounter: Payer: Self-pay | Admitting: Gastroenterology

## 2022-05-15 NOTE — Telephone Encounter (Signed)
Offered appt this day for COVID vaccine. Angel Wells says that [facility] will [facility] for vaccination. No further needs this call.

## 2022-05-16 ENCOUNTER — Ambulatory Visit: Payer: Medicare Other | Admitting: Immunology

## 2022-05-16 ENCOUNTER — Ambulatory Visit: Payer: Medicare Other

## 2022-05-17 ENCOUNTER — Other Ambulatory Visit: Payer: Self-pay | Admitting: Oncology

## 2022-05-17 DIAGNOSIS — J4540 Moderate persistent asthma, uncomplicated: Secondary | ICD-10-CM

## 2022-05-17 DIAGNOSIS — E1165 Type 2 diabetes mellitus with hyperglycemia: Secondary | ICD-10-CM

## 2022-05-18 ENCOUNTER — Other Ambulatory Visit: Payer: Self-pay | Admitting: Oncology

## 2022-05-18 DIAGNOSIS — Z8701 Personal history of pneumonia (recurrent): Secondary | ICD-10-CM

## 2022-05-18 MED ORDER — TOBI 300 MG/5ML IN NEBU
300.0000 mg | INHALATION_SOLUTION | Freq: Two times a day (BID) | RESPIRATORY_TRACT | 5 refills | Status: DC
Start: 2022-05-18 — End: 2022-05-26

## 2022-05-18 NOTE — Telephone Encounter (Signed)
Received a [facility] from [facility] stating that a new script is needed because it is expired.

## 2022-05-22 NOTE — Progress Notes (Signed)
Outgoing paperwork:    Completed paperwork received on  05/22/2022  Sent to scanning? Yes  Sent as outlined in prior documentation: Fax to  [phone]    [name]  4:31 PM  05/22/2022

## 2022-05-24 ENCOUNTER — Telehealth: Payer: Self-pay | Admitting: Oncology

## 2022-05-24 ENCOUNTER — Other Ambulatory Visit: Payer: Self-pay | Admitting: Oncology

## 2022-05-24 ENCOUNTER — Other Ambulatory Visit: Payer: Self-pay | Admitting: Pediatrics

## 2022-05-24 DIAGNOSIS — K219 Gastro-esophageal reflux disease without esophagitis: Secondary | ICD-10-CM

## 2022-05-24 NOTE — Telephone Encounter (Signed)
Called and spoke with Dad and [name],RN on speaker phone  Has not had BM in 3 days  Has PRN suppository  Both dad and [name] agreeable to PRN suppository  Wanted PCP to be aware    Denies abdominal distension  No behavioral changes  Will call if no BM tomorrow and/ or any other concerns    Leonard Schwartz, RN

## 2022-05-24 NOTE — Telephone Encounter (Signed)
[facility] TELEPHONE TRIAGE     Reason for call: [name] states patient has not had BM in three days and has received regular medications today. [name] requests patient is prescribed PRN for suppository.     Name of caller: [name] for [facility]  Relationship to patient: PDN  Phone:  [phone]

## 2022-05-26 ENCOUNTER — Telehealth: Payer: Self-pay | Admitting: Oncology

## 2022-05-26 ENCOUNTER — Other Ambulatory Visit: Payer: Self-pay | Admitting: Oncology

## 2022-05-26 DIAGNOSIS — Z8701 Personal history of pneumonia (recurrent): Secondary | ICD-10-CM

## 2022-05-26 MED ORDER — TOBI 300 MG/5ML IN NEBU
300.0000 mg | INHALATION_SOLUTION | Freq: Two times a day (BID) | RESPIRATORY_TRACT | 5 refills | Status: DC
Start: 2022-05-26 — End: 2023-08-23

## 2022-05-26 NOTE — Telephone Encounter (Signed)
Received a call from [facility] and was advised script is missing a code.

## 2022-05-27 ENCOUNTER — Other Ambulatory Visit: Payer: Self-pay | Admitting: Oncology

## 2022-05-29 ENCOUNTER — Encounter: Payer: Self-pay | Admitting: Oncology

## 2022-05-29 NOTE — Progress Notes (Signed)
Incoming paperwork for completion to the [facility]  Date Received: 05/29/2022    Type of paperwork:   Sender/organization: [employer]  Form type: Physician Orders  -  Addendum    Copy and scan before mailing? No    Completed paperwork to be sent to:  Fax to [phone]    Placed in box for: Sharmon Revere  9:00 AM  05/29/2022

## 2022-05-31 ENCOUNTER — Other Ambulatory Visit: Payer: Self-pay | Admitting: Pediatrics

## 2022-05-31 ENCOUNTER — Encounter: Payer: Self-pay | Admitting: Gastroenterology

## 2022-05-31 MED ORDER — METFORMIN HCL 500 MG/5ML PO SOLN *I*
1000.0000 mg | Freq: Two times a day (BID) | ORAL | 5 refills | Status: DC
Start: 2022-05-31 — End: 2022-06-05

## 2022-05-31 NOTE — Addendum Note (Signed)
Addended by: Benita Stabile on: 05/31/2022 09:35 AM     Modules accepted: Orders

## 2022-05-31 NOTE — Progress Notes (Signed)
Outgoing paperwork:    Completed paperwork received on  05/31/2022  Sent to scanning? Yes  Sent as outlined in prior documentation: Fax to [phone]    Sherron Ales  3:44 PM  05/31/2022

## 2022-05-31 NOTE — Telephone Encounter (Signed)
[facility] TELEPHONE TRIAGE     Reason for call: [name] requested that pharmacy receives new prescription for Metformin due to error in directions. [name] states patient is currently taking 10 ml twice daily instead of 5 ml.     Name of caller: [name] for [facility] Mccommon  Relationship to patient: PDN  Phone:  [phone]

## 2022-06-02 ENCOUNTER — Other Ambulatory Visit: Payer: Self-pay | Admitting: Oncology

## 2022-06-02 DIAGNOSIS — Z794 Long term (current) use of insulin: Secondary | ICD-10-CM

## 2022-06-05 ENCOUNTER — Telehealth: Payer: Self-pay | Admitting: Oncology

## 2022-06-05 ENCOUNTER — Telehealth: Payer: Self-pay

## 2022-06-05 NOTE — Telephone Encounter (Signed)
Received a fax from pharmacy stating Accu-Chek FastClix Lancets MISC  is not covered. Please advise

## 2022-06-05 NOTE — Telephone Encounter (Signed)
Spoke to [place] who had a question regarding giving nebulized saline treatment to [facility] via trach with Passey-Muir valve in place. I have recommended they remove the Passey-Muir valve to assure Angel Wells gets the most benefit from the medication, also we don't want the valve to become crusted and stick, also with multiple care givers, keeping the routine the same for all inhaled meds is preferred.

## 2022-06-08 ENCOUNTER — Encounter: Payer: Self-pay | Admitting: Oncology

## 2022-06-08 ENCOUNTER — Other Ambulatory Visit: Payer: Self-pay

## 2022-06-08 ENCOUNTER — Encounter: Payer: Self-pay | Admitting: Gastroenterology

## 2022-06-08 ENCOUNTER — Ambulatory Visit: Payer: Medicare Other | Attending: Oncology | Admitting: Oncology

## 2022-06-08 ENCOUNTER — Ambulatory Visit: Payer: Medicare Other

## 2022-06-08 VITALS — BP 117/57 | HR 85 | Temp 97.6°F | Ht 65.0 in | Wt 185.2 lb

## 2022-06-08 DIAGNOSIS — H5789 Other specified disorders of eye and adnexa: Secondary | ICD-10-CM

## 2022-06-08 DIAGNOSIS — E1165 Type 2 diabetes mellitus with hyperglycemia: Secondary | ICD-10-CM

## 2022-06-08 DIAGNOSIS — G8250 Quadriplegia, unspecified: Secondary | ICD-10-CM

## 2022-06-08 DIAGNOSIS — Z794 Long term (current) use of insulin: Secondary | ICD-10-CM | POA: Insufficient documentation

## 2022-06-08 DIAGNOSIS — M545 Low back pain, unspecified: Secondary | ICD-10-CM | POA: Insufficient documentation

## 2022-06-08 MED ORDER — TOUJEO MAX SOLOSTAR 300 UNIT/ML SC SOPN
75.0000 [IU] | PEN_INJECTOR | Freq: Every evening | SUBCUTANEOUS | 0 refills | Status: DC
Start: 2022-06-08 — End: 2022-06-09

## 2022-06-08 MED ORDER — NOVOLOG FLEXPEN 100 UNIT/ML SC SOPN
2.0000 [IU] | PEN_INJECTOR | Freq: Three times a day (TID) | SUBCUTANEOUS | 5 refills | Status: DC
Start: 2022-06-08 — End: 2023-04-20

## 2022-06-08 NOTE — Patient Instructions (Signed)
New Sliding Scale:  <70  Hold Insulin, give 15g of carb/sugar to treat hypoglycemia  70-180  No insulin  181-260 Give 1 Units   261-340  Give 2 units  341-400 Give 3 Units  >400    Give 4 units

## 2022-06-08 NOTE — Progress Notes (Signed)
[facility] - Comprehensive Medication Management Note    Angel Wells is a 31 y.o. male who presents for co-visit with PCP and clinical pharmacist. Patient was referred by PCP for comprehensive medication management in the setting of  diabetes . Patient's medications, preferred pharmacy and allergies were updated and marked as reviewed, as appropriate.                Vitals:    06/08/22 1409   BP: 117/57   Pulse: 85   Temp: 36.4 C (97.6 F)   Weight: 84 kg (185 lb 3.2 oz)   Height: 1.651 m (5\' 5" )        Additional labs/vitals as documented in chart - reviewed all relevant objective info at time of visit.    Current Outpatient Medications   Medication Sig Dispense Refill    levETIRAcetam (KEPPRA) 100 mg/mL solution give 15 MILLILITER by g-tube twice daily 2700 mL 5    metFORMIN (GLUCOPHAGE) 500 MG/5ML solution administer 5 mls (500 MILLIGRAM total) via g tube 2 times daily (with meals) 473 mL 5    Accu-Chek FastClix Lancets MISC 1 EACH BY MISC.(NON-DRUG COMBO ROUTE) ROUTE 3 (THREE) TIMES DAILY. E11.65 102 each 5    TOBI 300 MG/5ML nebulizer solution Inhale 5 mLs (300 mg total) by nebulization every 12 hours  for pseudomonas infection, trach/vented patient NEBULIZE 1 VIAL (300 MG) TWICE DAILY EVERY OTHER MONTH   DIAGNOSIS: E84.0 280 mL 5    famotidine (PEPCID) 20 mg tablet take 1 tablet by mouth 2 (two) times daily. ok to crush it and put it down tube if necessary 180 tablet 5    B-D UF III MINI PEN NEEDLES 31G X 5 MM USE 3 (THREE) TIMES DAILY AS NEEDED. 100 each 5    ipratropium (ATROVENT) 0.02 % nebulizer solution INHALE 2.5 MLS (500 MCG TOTAL) BY NEBULIZATION 2 TIMES DAILY FOR ASTHMA DX: J45.4 125 mL 5    blood glucose (ACCU-CHEK GUIDE) test strip USE 1 STRIP THREE TIMES DAILY. 100 each 5    Accu-Chek Guide (ACCU-CHEK GUIDE) kit Use to test blood sugar as directed 1 each 0    blood glucose monitor device Use to test blood sugar as directed 1 each 0    Blood Glucose Monitoring Suppl (FIFTY50 GLUCOSE  METER 2.0) w/Device KIT by Other route 1 kit 0    Docusate Sodium (SILACE) 150 MG/15ML syrup take 10 mls (100 MILLIGRAM total) by mouth 3 times daily  hold for loose stool 900 mL 5    chlorhexidine (PERIDEX) 0.12 % solution use as directed 473 mL 5    guaifenesin (HUMIBID E) 400 mg TABS take 1 tablet by mouth 2 times daily 60 tablet 5    albuterol (PROVENTIL) (2.5 mg/86mL) 0.083% nebulizer solution Inhale 3 mLs (2.5 mg total) by nebulization 4 times daily  for Spasm of Lung Air Passages with vesting.  When ill, use four times a day with vesting. 1050 mL 3    Docusate Sodium (SILACE) 150 MG/15ML syrup Take 10 mLs (100 mg total) by mouth 3 times daily  Hold for loose stool 900 mL 5    cetirizine (ZYRTEC) 10 mg tablet take 1 tablet (10 MILLIGRAM total) by mouth nightly 90 tablet 5    EPINEPHrine (EPIPEN) 0.3 mg/0.3 mL auto-injector use as directed 2 each 5    Docusate Sodium (SILACE) 150 MG/15ML syrup Administer 10 mLs (100 mg total) via G tube 3 times daily  for Constipation 900 mL 5  calcium carbonate 250 mg/mL (100 mg/mL elemental calcium) suspension Administer 3 mLs (750 mg total) via G tube daily 500 mL 5    sodium chloride (OCEAN) 0.65 % nasal spray Spray 1 spray into each nostril 2 times daily as needed for Congestion 45 mL 5    clonazePAM (KLONOPIN) 1 mg tablet take 1 tablet by mouth twice daily as needed (crush for g-tube) for seizures; MAXIMUM DAILY DOSE 2 tabs 10 tablet 0    Adhesive Tape (PAPER TAPE 1"X10YD) TAPE Use as directed. Diagnosis code: G82.50 300 each 5    Catheter Syringes 60 ML MISC Use as directed. Diagnosis code: G82.50 5 each 5    fluticasone (FLONASE) 50 MCG/ACT nasal spray spray 1 spray into nostril daily 16 g 5    SENNA 8.6 MG tablet administer 2 tablets via g tube daily 180 tablet 5    calcium carbonate 650 mg tablet take 1 tablet (650 MILLIGRAM total) by mouth daily via g tube 30 tablet 5    calcium carbonate (TUMS) 500 MG chewable tablet Administer 1 tablet (500 mg total) via G tube  daily 30 tablet 11    bisacodyl (DULCOLAX) 10 mg suppository please give suppository every other day, please check with dad prior to giving 12 each 5    ACCU-CHEK AVIVA PLUS test strip USE 1 STRIP THREE TIMES DAILY. DX: E11.65 AND Z79.4 100 each 5    lacosamide (VIMPAT) 10 mg/mL SOLN oral solution Take 10 MILLILITER per tube twice daily. ; MAXIMUM DAILY DOSE 20 MILLILITER 600 mL 5    ibuprofen (ADVIL,MOTRIN) 600 mg tablet take 1 tablet by mouth 4 times daily as needed for pain 30 tablet 11    sodium chloride 0.9 % nebulizer solution inhale 3 mls by nebulization as needed for wheezing mcaid id [id number] 120 mL 5    carbamide peroxide (DEBROX) 6.5 % otic solution use 5 drops twice daily on the first 3 days of each month. 15 mL 5    nystatin (MYCOSTATIN) powder apply topically 4 times daily  to the following areas: neck, groin, axilla 60 g 0    generic DME Low rate alarms set at 4 breaths, high rate alarm set at 40 breaths, pressure support set at 13 cms 1 each 0    budesonide (PULMICORT) 0.25 mg/26mL nebulizer suspension INHALE 2 ML (CONTENT OF 1 VIAL) VIA NEBULIZER 2 TIMES DAILY FOR ASTHMA 360 mL 5    generic DME Ventilator for home use with humidifier 1 each 0    oxygen concentrator Use  as instructed 1 each 0    generic DME Item to dispense: Bivona silicone cuffed trach tube size 7 80 mm length  Instructions for use: Use as directed 2 each 5    Nebulizers (PARI PRONEB MAX NEBULIZER) device Pari pron neb or comperableUse as directed 1 each 0    generic DME Item to dispense: trach care kits  Instructions for use: use as directed 30 each 5    generic DME Item to dispense: trach ties  Instructions for use: use as directed 30 each 5    Gauze Pads & Dressings (SPLIT GAUZE DRAINAGE SPONGE) 4"X4" Use  as instructed. 30 each 5    generic DME Item to dispense: 2X2 gauze 30 each 5    generic DME Item to dispense: stationary pulse oximeter 1 each 0    generic DME Item to dispense: Purple Passey-Muir valve  Instructions for use:  Use As Directed 1 each 5  generic DME Vent settings:  SIMV/PC/PS, RATE 10 IP 21. PS 13, PEEP 7, HIGH PRESSURE 50, LOW PRESSURE 5, HIGH RR 40, LOW RR14 1 each 0    suction catheter 14 Fr (ARGYLE) Needs 12 fr. Catheters. Use as directed for tracheal secretions 30 each 11    suction machine Instructions for use: use as directed for clearance of secretions 1 each 0    Oral Hygiene Products (Q-CARE COVERD YANKAUER/SUCTION) MISC Yankauer suction tip to be used when clearing oral secretions 4 each 11    generic DME .Cough Assist and related supplies. Use as directed 1 each 0    baclofen (LIORESAL) 10 mg tablet take 1 tablet (10 milligram total) by mouth 2 times daily 180 tablet 5    GLUCAGON EMERGENCY 1 MG injection kit Use as directed AS NEEDED for blood glucose < 70 1 each 0    generic DME Item to dispense: Multifunction Vent  Instructions for use: use 24 hours a day 1 each 0    generic DME Item to dispense: Portable Suction Machine Repair or Replace  Instructions for use: Use As Directed 1 each 0    valproate (VALPROIC ACID) 50 mg/mL syrup take 5 mls (250 milligram total) by mouth 3 times daily 1350 mL 5    nystatin (MYCOSTATIN) 100,000 Units/mL suspension Swish and swallow 5 mLs (500,000 units total) 4 times daily  for Candidiasis Fungal Infection of the Oropharynx Swish and hold in mouth before swallowing. 473 mL 0    generic DME Item to dispense: 2 Tracheostomies #7 Bivona Silicone Cuffed 80 mm Length  Instructions for use: use as directed 2 each 6    generic DME Item to dispense: hospital bed mattress Instructions for use: use as directed 1 each 0    generic DME Gel Overlay Mattress.  LON: 99, [id number]. Use as directed. G82.50, S06.9x9 1 each 0    generic DME Item to dispense: Large Volume Nebulizer Pump 50 psi  Instructions for use: use as directed 1 each 0    generic DME Non-sterile gloves size medium Use as directed. MCAID ID [id number] 2boxes/month 3 each 5    nystatin (MYCOSTATIN) 100000 UNIT/GM cream Apply  topically 2 times daily  to the following areas: buttocks 15 g 11    sodium chloride 0.9% 0.9 % irrigation Irrigate with 250 mLs as directed 3 times daily 500 mL 0    syringe disposable (BD LUER-LOK) 10 ML Use as instructed for inflation of tracheostomy balloon. 5 each 11    Nebulizer MISC Disposable nebulizer kits to be used inline with trach  MCAID ID [id number] 4 each 5    LACTULOSE 10 GM/15ML solution administer 30 mls (20 g total) via g tube daily as needed (no stool in 48 hours) use as directed 946 mL 5    oxygen 1-2 L/min to keep O2 sat above 92%, may increase up to 4 lpm as needed.  Low saturation alarm 85%, high alarm off, low HR 50, High HR 200 1 each 0    generic DME Item to dispense: Stationary Continuous Oximeter  Instructions for use: Use at night time Dx: Z99.11 1 each 0    generic DME Cough assist machine repairs for: [employer] respironics cough assist model T70. 1 each 0    generic DME Repair, parts, and labor for coughing assist machine. MCAID CIN: [id number] 1 each 0    Misc. Devices (PULSE OXIMETER) MISC Pulse Oximeter with Supplies, LON: 99, [id number], J96.11, Z99.11 1 each 0  Syringe Disposable 60 ML MISC Use as directed 5 each 5    generic DME Adhesive tape 1" x 5 yards. Use as directed. Disp 3 box 3 each 5    generic DME Suction canister Use as directed.MCAID ID [id number] 4 each 5    generic DME Suction tubing Use as directed.MCAID ID [id number] 4 each 5    suction machine Instructions for use: Use as needed for secretions, LON: 99 months, [id number] 1 each 0    generic DME Pulse ox probe replacement tape. Use as directed.[id number], LON 99 15 each 5    generic DME Pulse ox sensors (disposable)Use as directed.MCAID ID [id number], LON 99 4 each 5    generic DME Hill Rom Percussion Vest Model # 105  55inch  Use as directed. 1 each 0    Cholecalciferol 25 MCG (1000 UT) CHEW Take 1,000 units by mouth daily  2 chews daily      Non-System Medication Ok to change Gtube at home by nursing staff every 3 months and  PRN 1 each 0    MIC-KEY gastrostomy kit 14FR Please dispense every 3 months and prn, MicKey 14Fr 2.5cm 1 kit 3    lactobacillus rhamnosus, GG, (CULTURELLE) capsule Take 1 capsule (1 each total) by mouth daily 30 capsule 5    triamcinolone (KENALOG) 0.025 % cream Apply topically 2 times daily  APPLY TO AFFECTED AREA 15 g 2    zinc oxide (DESITIN) 40 % paste Apply topically as needed for Diaper Rash 57 g 1    generic DME TRACH TIES WITH O2 PORT. Use as directed.MCAID ID [id number] 30 each 11    MIC-KEY gastrostomy feeding tube extension set 12" Use as directed with gtube 1 kit 11    generic DME Adult size trach ties Use as directed. 30 each 5    generic DME Butterfly [employer] for use in wheelchair, dispense 1. Use as directed. Dx G80.0, S06.9x9, LON: lifetime 1 each 0    MIC-KEY gastrostomy feeding tube extension set 24" Use as directed, please dispense 4 per month 4 kit 5    gauze pads 2"X2" Use as directed around trach. MCAID ID [id number] 100 each 5    Nebulizers (PARI LC PLUS VIOS PRO NEB) MISC Pari Vios Pro Neb or comparable with Nebulizer kits and nebulizer trach mask. Use twice daily With albuterol.MCAID # [id number], MCARE [id number] 1 each 0    generic DME 15mm Adapter for trach to be used as directed. Replace weekly. Dx.Z99.11 4 each 5    generic DME 15 mm adapters for nebulized therapies via trach  Medicare ID [id number], Medicaid ID [id number] 4 each 11    nebulizer device with mask and tubing Use as directed 1 kit 0    generic DME Please dispense one 50mL syringe. Use as directed. 1 each 5    generic DME MCAID ID [id number], BIVONA CUFFED TRACH 7.0 Please change trach every month and as needed. 1 each 5    generic DME Please dispense 30mL measuring cup. Use as directed. 1 each 5    Gauze Pads & Dressings (SPLIT GAUZE DRAINAGE SPONGE) 4"X4" Use once and PRN times a day as instructed. 30 each 5    generic DME 10 isothermal adult "antiflect" connector. MCAID [id number] Use as directed. 10 each 5    generic DME Suction  catheter with chimney valve.Use as directed.MCAID ID [id number] 1 each 0    gauze sponge (CURITY) 4"X4" pads Use PRN as instructed.MCAID ID [id number] 50 each  5    generic DME PASSEY MUIR VALVE (PURPLE) Use as tolerated.MCAID ID [id number] 1 each 3    Adhesive Tape 1"x5yd TAPE By 1 Units no specified route as needed MCAID ID [id number] 3 each 5    generic DME 10 ml syringes Use as directed.MCAID ID [id number] 2 each 5    generic DME Trach care kits Use as directed.MCAID ID [id number] 30 each 5    surgical lubricant (SURGILUBE) gel Apply topically as needed MCAID ID [id number] 60 g 5    Distilled Water LIQD Distilled water, use as directedMCAID ID [id number] 10 Bottle 5    generic DME Mepilex 4X 4. Use as directed.MCAID ID [id number] 1 each 5    generic DME HME for trach humidification Use as directed.MCAID ID [id number] 30 each 5    generic DME Cough Assist circuits Use as directed.MCAID ID [id number] 1 each 5    generic DME Suction filter Use as directed.MCAID ID [id number] 2 each 5    generic DME Nasal adapters Use as directed.MCAID ID [id number] 5 each 5    diphenhydrAMINE (BENADRYL) 25 MG oral solid Administer 1 each (25 mg total) via G tube nightly as needed for Sleep      FIBER SELECT GUMMIES PO Take by mouth Give 2 gummies daily      Multiple Vitamins-Minerals (HM MULTIVITAMIN ADULT GUMMY PO) Take by mouth Give 2 gummies daily      Ascorbic Acid (VITAMIN C ADULT GUMMIES PO) Take 250 mg by mouth daily  Give 2 gummies once daily       oxygen 2 l/m bleed through ventilator at night 1 each 0    miconazole (MICATIN) 2 % powder 2 times daily as needed       insulin pen needle (FIFTY50 PEN NEEDLES) 31G X 8 MM 1 each      Insulin Glargine, 2 Unit Dial, (TOUJEO MAX SOLOSTAR) 300 UNIT/ML injection pen Inject 75 units into the skin nightly  for Type 2 Diabetes Discard each pen 56 days after first use. 9 each 0    insulin aspart (NOVOLOG FLEXPEN) 100 UNIT/ML injection pen Inject 2-10 units into the skin 3 times daily (before meals)  <70 Hold  Insulin, give 15g of carb/sugar to treat hypoglycemia.  70-180 No insulin.  181-260 Give 1 Units  261-340 Give 2 units 341-400 Give 3 Units  >400  Give 4 units 45 each 5     No current facility-administered medications for this visit.         Assessment and Plan:  With regards to medication management for this patient, the following aspects were reviewed: Indication, effectiveness, safety and convenience of each medication. The patient's medical conditions and medications were assessed, evaluated, and deemed meeting goals of drug therapy except as listed below:     1. Diabetes  BG's much improved. Fasting BG's 80-100mg /dL  Pre-meal BG's: 130mg /dL  Due for B7S, last E8B=1.5%  Decrease Toujeo to 75 units nightly  Repeat A1c in 3 months  Decrease sliding scale to:    New Sliding Scale:  <70  Hold Insulin, give 15g of carb/sugar to treat hypoglycemia  70-180  No insulin  181-260 Give 1 Units   261-340  Give 2 units  341-400 Give 3 Units  >400    Give 4 units       2. Other Medication-Related Interventions: In addition to the medication changes noted above, these medication-related interventions were completed by the Pharmacist during today's visit:  disease state education +  other    Plan  Recommendations were discussed with Provider Homero Fellers, NP) during appointment. Plan for implementation of medication changes are documented in Provider's note.     Patient was able to verbalize understanding and repeat back key educational points.    Follow-up:  3 months      Patient Status: Open to [facility]     Duration of Visit: 16-30 min    Thank you for allowing me to participate in the care of this patient. Please contact me with any questions.     Abram Sander, PharmD., [facility]  Ambulatory Care Clinical Pharmacy Specialist  [facility]  [address] [place], [place] [address]  [phone]

## 2022-06-08 NOTE — Progress Notes (Unsigned)
[facility]  Visit Note  [address]  [place], [place] [address]  P: [phone]  F: [phone]     REASON FOR TODAY'S VISIT      Chief Complaint   Patient presents with    Follow-up     SUBJECTIVE      Patient presents to clinic in person *** (via telemedicine) with *** to discuss the following:    Eye redness crusty inam    Doing well    Bg concrns    Allergy testing upcoming so will be stopping allergy med    Back pain  Wondering about pt  Gets tylenol daily  While in chair  PT has cleared    ROS  Pertinent Review of Systems as per above.    Past Medical History, Social History, Family History, and Medications/allergies reviewed during this visit.    Current Outpatient Medications   Medication    levETIRAcetam (KEPPRA) 100 mg/mL solution    metFORMIN (GLUCOPHAGE) 500 MG/5ML solution    Accu-Chek FastClix Lancets MISC    TOBI 300 MG/5ML nebulizer solution    famotidine (PEPCID) 20 mg tablet    Insulin Glargine, 2 Unit Dial, (TOUJEO MAX SOLOSTAR) 300 UNIT/ML injection pen    B-D UF III MINI PEN NEEDLES 31G X 5 MM    ipratropium (ATROVENT) 0.02 % nebulizer solution    blood glucose (ACCU-CHEK GUIDE) test strip    Accu-Chek Guide (ACCU-CHEK GUIDE) kit    blood glucose monitor device    Blood Glucose Monitoring Suppl (FIFTY50 GLUCOSE METER 2.0) w/Device KIT    Docusate Sodium (SILACE) 150 MG/15ML syrup    chlorhexidine (PERIDEX) 0.12 % solution    guaifenesin (HUMIBID E) 400 mg TABS    albuterol (PROVENTIL) (2.5 mg/60mL) 0.083% nebulizer solution    Docusate Sodium (SILACE) 150 MG/15ML syrup    cetirizine (ZYRTEC) 10 mg tablet    EPINEPHrine (EPIPEN) 0.3 mg/0.3 mL auto-injector    Docusate Sodium (SILACE) 150 MG/15ML syrup    calcium carbonate 250 mg/mL (100 mg/mL elemental calcium) suspension    sodium chloride (OCEAN) 0.65 % nasal spray    clonazePAM (KLONOPIN) 1 mg tablet    Adhesive Tape (PAPER TAPE 1"X10YD) TAPE    Catheter Syringes 60 ML MISC    fluticasone (FLONASE) 50 MCG/ACT nasal spray     SENNA 8.6 MG tablet    calcium carbonate 650 mg tablet    calcium carbonate (TUMS) 500 MG chewable tablet    bisacodyl (DULCOLAX) 10 mg suppository    ACCU-CHEK AVIVA PLUS test strip    lacosamide (VIMPAT) 10 mg/mL SOLN oral solution    ibuprofen (ADVIL,MOTRIN) 600 mg tablet    sodium chloride 0.9 % nebulizer solution    carbamide peroxide (DEBROX) 6.5 % otic solution    nystatin (MYCOSTATIN) powder    generic DME    budesonide (PULMICORT) 0.25 mg/23mL nebulizer suspension    generic DME    oxygen concentrator    generic DME    Nebulizers (PARI PRONEB MAX NEBULIZER) device    generic DME    generic DME    Gauze Pads & Dressings (SPLIT GAUZE DRAINAGE SPONGE) 4"X4"    generic DME    generic DME    generic DME    generic DME    suction catheter 14 Fr (ARGYLE)    suction machine    Oral Hygiene Products (Q-CARE COVERD YANKAUER/SUCTION) MISC    generic DME    baclofen (LIORESAL) 10 mg  tablet    GLUCAGON EMERGENCY 1 MG injection kit    generic DME    insulin aspart (NOVOLOG FLEXPEN) 100 UNIT/ML injection pen    generic DME    valproate (VALPROIC ACID) 50 mg/mL syrup    nystatin (MYCOSTATIN) 100,000 Units/mL suspension    generic DME    generic DME    generic DME    generic DME    generic DME    nystatin (MYCOSTATIN) 100000 UNIT/GM cream    sodium chloride 0.9% 0.9 % irrigation    syringe disposable (BD LUER-LOK) 10 ML    Nebulizer MISC    LACTULOSE 10 GM/15ML solution    oxygen    generic DME    generic DME    generic DME    Misc. Devices (PULSE OXIMETER) MISC    Syringe Disposable 60 ML MISC    generic DME    generic DME    generic DME    suction machine    generic DME    generic DME    generic DME    Cholecalciferol 25 MCG (1000 UT) CHEW    Non-System Medication    MIC-KEY gastrostomy kit 14FR    lactobacillus rhamnosus, GG, (CULTURELLE) capsule    triamcinolone (KENALOG) 0.025 % cream    zinc oxide (DESITIN) 40 % paste    generic DME    MIC-KEY gastrostomy feeding tube extension set 12"    generic DME    generic DME     MIC-KEY gastrostomy feeding tube extension set 24"    gauze pads 2"X2"    Nebulizers (PARI LC PLUS VIOS PRO NEB) MISC    generic DME    generic DME    nebulizer device with mask and tubing    generic DME    generic DME    generic DME    Gauze Pads & Dressings (SPLIT GAUZE DRAINAGE SPONGE) 4"X4"    generic DME    generic DME    gauze sponge (CURITY) 4"X4" pads    generic DME    Adhesive Tape 1"x5yd TAPE    generic DME    generic DME    surgical lubricant (SURGILUBE) gel    Distilled Water LIQD    generic DME    generic DME    generic DME    generic DME    generic DME    diphenhydrAMINE (BENADRYL) 25 MG oral solid    FIBER SELECT GUMMIES PO    Multiple Vitamins-Minerals (HM MULTIVITAMIN ADULT GUMMY PO)    Ascorbic Acid (VITAMIN C ADULT GUMMIES PO)    oxygen    miconazole (MICATIN) 2 % powder    insulin pen needle (FIFTY50 PEN NEEDLES) 31G X 8 MM     No current facility-administered medications for this visit.       OBJECTIVE      There were no vitals taken for this visit.    Physical Exam    ASSESSMENT / DIAGNOSIS     There are no diagnoses linked to this encounter.    Orders Placed This Encounter   No orders placed during this encounter.       Patient instructed to call if symptoms are not improving or worsening    -- No follow-ups on file.  -- Considerations for next visit include:     I personally spent *** minutes on the calendar day of the encounter, including pre and post visit work reviewing the EMR and management of this patient.  [name], NP  2:08 PM  06/08/22  [facility]  [address]  [place], [place] [address]  P: [phone]  F: [phone]

## 2022-06-08 NOTE — Progress Notes (Signed)
Angel Wells's nurse would like cough assist settings checked to see if appropriate. Did several rounds of 5 cycles. Settings are appropriate with good chest rise and adequate time for inspiration and exhalation. Suggest saline nebs multiple times a day, nursing staff does not do saline instilled directly into trach. Suctioned for small amout thisck, yelloe secretions after cough assist treatment.

## 2022-06-09 ENCOUNTER — Telehealth: Payer: Self-pay | Admitting: Oncology

## 2022-06-09 MED ORDER — TOUJEO MAX SOLOSTAR 300 UNIT/ML SC SOPN
75.0000 [IU] | PEN_INJECTOR | Freq: Every evening | SUBCUTANEOUS | 0 refills | Status: DC
Start: 2022-06-09 — End: 2022-06-09

## 2022-06-09 MED ORDER — TOUJEO MAX SOLOSTAR 300 UNIT/ML SC SOPN
74.0000 [IU] | PEN_INJECTOR | Freq: Every evening | SUBCUTANEOUS | 0 refills | Status: DC
Start: 2022-06-09 — End: 2022-11-15

## 2022-06-09 NOTE — Telephone Encounter (Signed)
Writer spoke to [place], [place]. [name] relayed order must be changed today, before she leaves patient for the weekend, or [name] must receive call from nurse.   [phone]

## 2022-06-09 NOTE — Progress Notes (Signed)
[facility]    Visit Note     REASON FOR VISIT      Chief Complaint   Patient presents with    Follow-up         PRIMARY DIAGNOSIS          Subjective     SUBJECTIVE      Here today for routine follow up    Eye redness:  Happening every morning  Wakes up and skin around eyes is slightly red and crusty  Resolves throughout the day    Will be having allergy testing in the near future    Back pain:  Continues to struggle with back pain  No active or new issue - was cleared by PT  Still desires PT for strengthening    ROS  Review of Systems as per HPI above    Past Medical History, Social History, Family History, and Medications/allergies reviewed during this visit    Current Outpatient Medications   Medication    levETIRAcetam (KEPPRA) 100 mg/mL solution    metFORMIN (GLUCOPHAGE) 500 MG/5ML solution    Accu-Chek FastClix Lancets MISC    TOBI 300 MG/5ML nebulizer solution    famotidine (PEPCID) 20 mg tablet    B-D UF III MINI PEN NEEDLES 31G X 5 MM    ipratropium (ATROVENT) 0.02 % nebulizer solution    blood glucose (ACCU-CHEK GUIDE) test strip    Accu-Chek Guide (ACCU-CHEK GUIDE) kit    blood glucose monitor device    Blood Glucose Monitoring Suppl (FIFTY50 GLUCOSE METER 2.0) w/Device KIT    Docusate Sodium (SILACE) 150 MG/15ML syrup    chlorhexidine (PERIDEX) 0.12 % solution    guaifenesin (HUMIBID E) 400 mg TABS    albuterol (PROVENTIL) (2.5 mg/74mL) 0.083% nebulizer solution    Docusate Sodium (SILACE) 150 MG/15ML syrup    cetirizine (ZYRTEC) 10 mg tablet    EPINEPHrine (EPIPEN) 0.3 mg/0.3 mL auto-injector    Docusate Sodium (SILACE) 150 MG/15ML syrup    calcium carbonate 250 mg/mL (100 mg/mL elemental calcium) suspension    sodium chloride (OCEAN) 0.65 % nasal spray    clonazePAM (KLONOPIN) 1 mg tablet    Adhesive Tape (PAPER TAPE 1"X10YD) TAPE    Catheter Syringes 60 ML MISC    fluticasone (FLONASE) 50 MCG/ACT nasal spray    SENNA 8.6 MG tablet    calcium carbonate 650 mg tablet    calcium  carbonate (TUMS) 500 MG chewable tablet    bisacodyl (DULCOLAX) 10 mg suppository    ACCU-CHEK AVIVA PLUS test strip    lacosamide (VIMPAT) 10 mg/mL SOLN oral solution    ibuprofen (ADVIL,MOTRIN) 600 mg tablet    sodium chloride 0.9 % nebulizer solution    carbamide peroxide (DEBROX) 6.5 % otic solution    nystatin (MYCOSTATIN) powder    generic DME    budesonide (PULMICORT) 0.25 mg/61mL nebulizer suspension    generic DME    oxygen concentrator    generic DME    Nebulizers (PARI PRONEB MAX NEBULIZER) device    generic DME    generic DME    Gauze Pads & Dressings (SPLIT GAUZE DRAINAGE SPONGE) 4"X4"    generic DME    generic DME    generic DME    generic DME    suction catheter 14 Fr (ARGYLE)    suction machine    Oral Hygiene Products (Q-CARE COVERD YANKAUER/SUCTION) MISC    generic DME    baclofen (LIORESAL) 10 mg tablet  GLUCAGON EMERGENCY 1 MG injection kit    generic DME    generic DME    valproate (VALPROIC ACID) 50 mg/mL syrup    nystatin (MYCOSTATIN) 100,000 Units/mL suspension    generic DME    generic DME    generic DME    generic DME    generic DME    nystatin (MYCOSTATIN) 100000 UNIT/GM cream    sodium chloride 0.9% 0.9 % irrigation    syringe disposable (BD LUER-LOK) 10 ML    Nebulizer MISC    LACTULOSE 10 GM/15ML solution    oxygen    generic DME    generic DME    generic DME    Misc. Devices (PULSE OXIMETER) MISC    Syringe Disposable 60 ML MISC    generic DME    generic DME    generic DME    suction machine    generic DME    generic DME    generic DME    Cholecalciferol 25 MCG (1000 UT) CHEW    Non-System Medication    MIC-KEY gastrostomy kit 14FR    lactobacillus rhamnosus, GG, (CULTURELLE) capsule    triamcinolone (KENALOG) 0.025 % cream    zinc oxide (DESITIN) 40 % paste    generic DME    MIC-KEY gastrostomy feeding tube extension set 12"    generic DME    generic DME    MIC-KEY gastrostomy feeding tube extension set 24"    gauze pads 2"X2"    Nebulizers (PARI LC PLUS VIOS PRO NEB) MISC    generic  DME    generic DME    nebulizer device with mask and tubing    generic DME    generic DME    generic DME    Gauze Pads & Dressings (SPLIT GAUZE DRAINAGE SPONGE) 4"X4"    generic DME    generic DME    gauze sponge (CURITY) 4"X4" pads    generic DME    Adhesive Tape 1"x5yd TAPE    generic DME    generic DME    surgical lubricant (SURGILUBE) gel    Distilled Water LIQD    generic DME    generic DME    generic DME    generic DME    generic DME    diphenhydrAMINE (BENADRYL) 25 MG oral solid    FIBER SELECT GUMMIES PO    Multiple Vitamins-Minerals (HM MULTIVITAMIN ADULT GUMMY PO)    Ascorbic Acid (VITAMIN C ADULT GUMMIES PO)    oxygen    miconazole (MICATIN) 2 % powder    insulin pen needle (FIFTY50 PEN NEEDLES) 31G X 8 MM    Insulin Glargine, 2 Unit Dial, (TOUJEO MAX SOLOSTAR) 300 UNIT/ML injection pen    insulin aspart (NOVOLOG FLEXPEN) 100 UNIT/ML injection pen     No current facility-administered medications for this visit.          Objective     OBJECTIVE      BP 117/57   Pulse 85   Temp 36.4 C (97.6 F) (Temporal)   Ht 1.651 m (5\' 5" )   Wt 84 kg (185 lb 3.2 oz)   SpO2 94%   BMI 30.82 kg/m     Physical Exam  Constitutional:       General: He is not in acute distress.     Appearance: He is not ill-appearing, toxic-appearing or diaphoretic.   Cardiovascular:      Rate and Rhythm: Normal rate and regular rhythm.   Pulmonary:  Effort: Pulmonary effort is normal. No respiratory distress.      Breath sounds: Normal breath sounds.   Neurological:      Mental Status: He is alert.                ASSESSMENT / DIAGNOSIS     1. Type 2 diabetes mellitus with hyperglycemia, with long-term current use of insulin  Due for routine monitoring  Appreciate pharmD insulin change recommendations  - Microalbumin, Urine, Random; Future  - Hemoglobin A1c; Future  - insulin aspart (NOVOLOG FLEXPEN) 100 UNIT/ML injection pen; Inject 2-10 units into the skin 3 times daily (before meals)  <70 Hold Insulin, give 15g of carb/sugar to  treat hypoglycemia.  70-180 No insulin.  181-260 Give 1 Units  261-340 Give 2 units 341-400 Give 3 Units  >400  Give 4 units  Dispense: 45 each; Refill: 5    2. Spastic quadriplegia  Will refer to [facility] for PT to help prevent deconditioning- discussed I cannot promise insurance will cover this  - AMB REFERRAL TO HOME HEALTH SERVICES    3. Low back pain  See above  - AMB REFERRAL TO HOME HEALTH SERVICES      4.  Eye redness:  -No interventions required - likely d/t allergies or dry air  Can consider humidifier in the room      Discussed with Dad via phone who agrees with changes  Orders Placed This Encounter    Microalbumin, Urine, Random    Hemoglobin A1c    AMB REFERRAL TO HOME HEALTH SERVICES    insulin aspart (NOVOLOG FLEXPEN) 100 UNIT/ML injection pen       Patient Instructions   New Sliding Scale:  <70  Hold Insulin, give 15g of carb/sugar to treat hypoglycemia  70-180  No insulin  181-260 Give 1 Units   261-340  Give 2 units  341-400 Give 3 Units  >400    Give 4 units        --Patient instructed to call if symptoms are not improving or worsening  --Follow-up arranged  No follow-ups on file.     I personally spent 30 minutes on the calendar day of the encounter, including pre and post visit work reviewing the EMR and management of this patient.     Electronically signed by Palma Holter, NP on 06/09/2022 at 10:54 AM     [facility], Phone: [phone]

## 2022-06-09 NOTE — Telephone Encounter (Signed)
[facility] TELEPHONE TRIAGE     Reason for call: [name] requests to receive new order for Toujeo Insulin due to current directions. [name] states order is written for 75 units but should be for 74 units as pen delivers doses in 2 unit increments.     Name of caller: [name] for [facility]  Relationship to patient: PDN  Phone:  [phone]

## 2022-06-09 NOTE — Telephone Encounter (Signed)
Called and spoke with pts father Carolin Guernsey that toujeo order has been changed to 74 units nightly  Dad aware that [name] called asking for the change and will relay the order change to [name]  No further need at this time

## 2022-06-12 ENCOUNTER — Telehealth: Payer: Self-pay | Admitting: Pediatrics

## 2022-06-12 NOTE — Telephone Encounter (Signed)
[facility]  Prior Authorization Request    Date of request: 06/12/2022  Time of call: 9:33 AM    Medication Requiring PA: Insulin Glargine, 2 Unit Dial, (TOUJEO MAX SOLOSTAR) 300 UNIT/ML injection pen     Directions(dosing and frequency): Inject 74 units into the skin nightly for Type 2 Diabetes Discard each pen 56 days after first use.     Quantity/ Day Supply: 9 Each    Dx code: E11.65    Request received from:[facility] PA Source: Pharmacy    Name and Phone Number of Pharmacy: [facility]    Additional Pharmacy Benefit plan: no    Secondary insurance: Yes    Please run secondary if not covered under primary: yes  Medicaid    Insurance coverage confirmed: yes    Covered Alternative Medication:     Tax ID: [id number]    Provider and NPI: [facility]- [id number]    Additional Justification:n/a    Key ID: [id number]    Please provide justification for PA submission & urgency or indicate which covered alternative was ordered

## 2022-06-12 NOTE — Telephone Encounter (Signed)
Called and lm about not taking antihistamines prior to upcoming appt,    Also sent Mychart message!

## 2022-06-13 ENCOUNTER — Other Ambulatory Visit
Admission: RE | Admit: 2022-06-13 | Discharge: 2022-06-13 | Disposition: A | Discharge status: Home / Self Care | Payer: Medicare Other | Source: Ambulatory Visit | Attending: Oncology | Admitting: Oncology

## 2022-06-13 ENCOUNTER — Other Ambulatory Visit: Payer: Self-pay | Admitting: Oncology

## 2022-06-13 DIAGNOSIS — G8250 Quadriplegia, unspecified: Secondary | ICD-10-CM | POA: Insufficient documentation

## 2022-06-13 DIAGNOSIS — Z794 Long term (current) use of insulin: Secondary | ICD-10-CM | POA: Insufficient documentation

## 2022-06-13 DIAGNOSIS — E1165 Type 2 diabetes mellitus with hyperglycemia: Secondary | ICD-10-CM | POA: Insufficient documentation

## 2022-06-13 LAB — MICROALBUMIN, URINE, RANDOM
Creatinine,UR: 30 mg/dL (ref 20–300)
Microalbumin,UR: <1.2 mg/dL

## 2022-06-13 LAB — HEMOGLOBIN A1C: Hemoglobin A1C: 5.2 %

## 2022-06-15 ENCOUNTER — Encounter: Payer: Self-pay | Admitting: Oncology

## 2022-06-15 NOTE — Progress Notes (Signed)
Incoming paperwork for completion to the [facility]  Date Received: 06/15/2022    Type of paperwork:   Sender/organization: [employer]  Form type: Physician Orders - Addendum    Copy and scan before mailing? No    Completed paperwork to be sent to:  Fax to [phone]    Placed in box for: Sharmon Revere  1:30 PM  06/15/2022

## 2022-06-16 ENCOUNTER — Other Ambulatory Visit: Payer: Self-pay | Admitting: Neurology

## 2022-06-16 ENCOUNTER — Other Ambulatory Visit: Payer: Self-pay | Admitting: Pediatrics

## 2022-06-16 DIAGNOSIS — G40219 Localization-related (focal) (partial) symptomatic epilepsy and epileptic syndromes with complex partial seizures, intractable, without status epilepticus: Secondary | ICD-10-CM

## 2022-06-16 MED ORDER — LACOSAMIDE 10 MG/ML PO SOLN *I*
ORAL | 5 refills | Status: DC
Start: 2022-06-16 — End: 2022-12-07

## 2022-06-19 ENCOUNTER — Encounter: Payer: Self-pay | Admitting: Pediatrics

## 2022-06-19 ENCOUNTER — Ambulatory Visit: Payer: Medicare Other

## 2022-06-19 ENCOUNTER — Ambulatory Visit: Payer: Medicare Other | Attending: Pediatrics | Admitting: Pediatrics

## 2022-06-19 ENCOUNTER — Other Ambulatory Visit: Payer: Self-pay

## 2022-06-19 ENCOUNTER — Telehealth: Payer: Self-pay | Admitting: Pediatrics

## 2022-06-19 ENCOUNTER — Other Ambulatory Visit
Admission: RE | Admit: 2022-06-19 | Discharge: 2022-06-19 | Disposition: A | Discharge status: Home / Self Care | Payer: Medicare Other | Source: Ambulatory Visit

## 2022-06-19 VITALS — BP 115/57 | HR 81 | Temp 97.3°F | Ht 65.0 in | Wt 185.2 lb

## 2022-06-19 DIAGNOSIS — Z9109 Other allergy status, other than to drugs and biological substances: Secondary | ICD-10-CM | POA: Insufficient documentation

## 2022-06-19 DIAGNOSIS — J309 Allergic rhinitis, unspecified: Secondary | ICD-10-CM | POA: Insufficient documentation

## 2022-06-19 DIAGNOSIS — E1165 Type 2 diabetes mellitus with hyperglycemia: Secondary | ICD-10-CM | POA: Insufficient documentation

## 2022-06-19 DIAGNOSIS — Z794 Long term (current) use of insulin: Secondary | ICD-10-CM | POA: Insufficient documentation

## 2022-06-19 LAB — MULTIPLE ORDERING DOCS

## 2022-06-19 NOTE — Progress Notes (Signed)
No testing completed today.
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
## 2022-06-19 NOTE — Progress Notes (Signed)
Outgoing paperwork:    Completed paperwork received on  06/19/2022  Sent to scanning? Yes  Sent as outlined in prior documentation: Fax to [phone]    Sherron Ales  9:17 AM  06/19/2022

## 2022-06-19 NOTE — Telephone Encounter (Signed)
Copied from CRM #[id number]. Topic: Appointments - Appointment Information  >> Jun 19, 2022 12:31 PM Abran Duke wrote:  [name], [facility] Aide/Nurse, called to report a late arrival.    Current appointment time: 1:00 PM  Expected arrival time: Caller stated they are unsure. Caller stated their transportation broke down. Writer advised the caller of the late policy  Patient was informed of late policy? yes    If necessary, [name], the patient's nurse, can be reached at [phone]

## 2022-06-19 NOTE — Patient Instructions (Signed)
Seen today for evaluation of environmental allergies   Recommend adding Azelastine nasal spray two sprays as needed   Recommend obtaining blood work for environmental allergens    Continue Cetirizine 10 mg for now   Can consider switch to Fexofenadine 180 mg twice a day

## 2022-06-19 NOTE — Telephone Encounter (Signed)
Pt is all set   Thanks Chelesea Weiand

## 2022-06-20 ENCOUNTER — Telehealth: Payer: Self-pay | Admitting: Pediatrics

## 2022-06-20 ENCOUNTER — Encounter: Payer: Self-pay | Admitting: Oncology

## 2022-06-20 LAB — DP MITES IGE: DP Mites IgE: 4.04 kU/L

## 2022-06-20 LAB — ALLERGEN JOHNSON GRASS IGE: Grass,Johnson IgE: <0.1 kU/L

## 2022-06-20 LAB — ALLERGEN OAK, RED IGE: Tree, Oak IgE: <0.1 kU/L

## 2022-06-20 LAB — YELLOW DOCK IGE: Yellow Dock IgE: <0.1 kU/L

## 2022-06-20 LAB — ALLERGEN WILLOW, PUSSY IGE: Tree,Willow IgE: <0.1 kU/L

## 2022-06-20 LAB — ALLERGEN TIMOTHY IGE: Grass,Timothy IgE: <0.1 kU/L

## 2022-06-20 LAB — ARTEMESIA IGE: Artemesia IgE: <0.1 kU/L

## 2022-06-20 LAB — ALLERGEN PIGWEED, ROUGH IGE: Pigweed,Common IgE: <0.1 kU/L

## 2022-06-20 LAB — ALLERGEN BIRCH: Tree,Birch IgE: <0.1 kU/L

## 2022-06-20 LAB — ALLERGEN MOUSE EPITHELIA IGE: Mouse Epithelial IgE: 0.15 kU/L

## 2022-06-20 LAB — COCKROACH GERMAN IGE: Cockroach,German IgE: 0.54 kU/L

## 2022-06-20 LAB — HEMOGLOBIN A1C: Hemoglobin A1C: 5.1 %

## 2022-06-20 LAB — ALLERGEN ORCHARD GRASS: Grass,Orchard IgE: <0.1 kU/L

## 2022-06-20 LAB — TREE, RED CEDAR IGE: Tree, red cedar IgE: <0.1 kU/L

## 2022-06-20 LAB — ALLERGEN DOG DANDER: Dog Dander: <0.1 kU/L

## 2022-06-20 LAB — ALLERGEN ALTERNARIA TENUIS: Alternaria Tenuis IgE: <0.1 kU/L

## 2022-06-20 LAB — ALLERGEN LAMB'S QUARTER IGE: Lamb's Quarters IgE: <0.1 kU/L

## 2022-06-20 LAB — ALLERGEN COTTONWOOD TREE IGE: Tree,Cottonwood IgE: 0.12 kU/L

## 2022-06-20 LAB — ALLERGEN HELMINTHOSPORIUM SATIVIUM: Helminthosporium IgE: <0.1 kU/L

## 2022-06-20 LAB — ALLERGEN COCKLEBUR IGE: Cocklebur IgE: 0.16 kU/L

## 2022-06-20 LAB — DF MITES IGE: DF Mites IgE: 3.29 kU/L

## 2022-06-20 LAB — ALLERGEN MAPLE/BOX ELDER IGE: Tree,Box Elder/Maple IgE: <0.1 kU/L

## 2022-06-20 LAB — FIREBRUSH KOCHIA IGE: Firebush Kochia IgE: <0.1 kU/L

## 2022-06-20 LAB — RAST INTERPRETATION

## 2022-06-20 LAB — TREE,WALNUT IGE: Tree,Walnut IgE: <0.1 kU/L

## 2022-06-20 LAB — ALLERGEN ELM TREE: Tree,Elm IgE: <0.1 kU/L

## 2022-06-20 LAB — ALLERGEN RAGWEED, SHORT/COMMON IGE: Ragweed,Short IgE: <0.1 kU/L

## 2022-06-20 LAB — ENGLISH PLANTAIN IGE: English Plantain IgE: <0.1 kU/L

## 2022-06-20 LAB — ALLERGEN ASPERGILLUS FUMAGATUS: A.fumigatus IgE: <0.1 kU/L

## 2022-06-20 LAB — ALLERGEN ASH, WHITE IGE: Tree,White Ash IgE: <0.1 kU/L

## 2022-06-20 LAB — TREE, WHITE HICKORY IGE: Tree, white hickory IgE: <0.1 kU/L

## 2022-06-20 LAB — ALLERGEN SHEEP SORREL (DOCK) IGE: Sheep Sorrel IgE: <0.1 kU/L

## 2022-06-20 LAB — ALLERGEN CAT EPI AND DANDER IGE: Cat Epi/Dander IgE: <0.1 kU/L

## 2022-06-20 LAB — TREE,PINE RAST: Tree,Pine IgE: <0.1 kU/L

## 2022-06-20 LAB — ALLERGEN SYCAMORE, AMERICAN IGE: Tree,Sycamore IgE: <0.1 kU/L

## 2022-06-20 NOTE — Telephone Encounter (Signed)
Copied from CRM [id number]. Topic: Access to Care - Speak to Provider/Office Staff  >> Jun 20, 2022  1:17 PM Rulon Abide wrote:  [name], patients nurse is calling looking to speak with a nurse or Dr. Venetia Maxon. She states she was in yesterday with the patient for his appointment and she was given medication orders that need more clarification.     She states for example, the orders say to give azelastine spray, 2 sprays as needed. She states she needs more specific directions. Should she give 2 sprays in each nostril or one spray in each nostril?    She says it also says to take FLONASE twice a day and can switch to [employer]. She would like to know how long they should wait to see if the Flonase works. Should they call first?    She would like to go over the details of the orders to have them written for all their staff to be able to administer correctly and accurately.     She would like a call back at [phone]

## 2022-06-20 NOTE — Progress Notes (Signed)
Incoming paperwork for completion to the [facility]  Date Received: 06/20/2022    Type of paperwork:   Sender/organization: [employer]  Form type: Practitioner Standard Written Order - Repair/Non-Routine Service    Completed paperwork to be sent to:  Fax to [phone]    Placed in box for: Pricilla Loveless  10:29 AM  06/20/2022

## 2022-06-21 ENCOUNTER — Telehealth: Payer: Self-pay | Admitting: Oncology

## 2022-06-21 ENCOUNTER — Encounter: Payer: Self-pay | Admitting: Oncology

## 2022-06-21 ENCOUNTER — Encounter: Payer: Self-pay | Admitting: Gastroenterology

## 2022-06-21 NOTE — Progress Notes (Signed)
[facility]  Prior Authorization Request    Date of request: 06/21/2022  Time of call: 10:04 AM    Medication Requiring PA: metFORMIN (GLUCOPHAGE) 500 MG/5ML solution     Directions(dosing and frequency): administer 5 mls (500 MILLIGRAM total) via g tube 2 times daily (with meals)     Quantity/ Day Supply: 473 mL    Dx code: E11.65    Request received from:[facility] PA Source: Pharmacy    Name and Phone Number of Pharmacy:    Additional Pharmacy Benefit plan: no    Secondary insurance: Yes    Please run secondary if not covered under primary: yes  Medicaid    Insurance coverage confirmed: yes    Covered Alternative Medication:     Tax ID: [id number]    Provider and NPI: [name]- [id number]    Additional Justification:n/a    Key ID:     Please provide justification for PA submission & urgency or indicate which covered alternative was ordered

## 2022-06-21 NOTE — Telephone Encounter (Signed)
Copied from CRM [id number]. Topic: Access to Care - Speak to Provider/Office Staff  >> Jun 21, 2022  1:16 PM Brunilda Payor wrote:  [name], the patient's nurse, is calling to follow up on the message she sent yesterday in regards to the instructions for the patient's medication. (Refer to previous message)    She states she needs clarification before the end of the day, because she "has all this paperwork that is dated for tomorrow".    Please reach out to [name] as soon as possible at [phone].

## 2022-06-21 NOTE — Telephone Encounter (Unsigned)
Copied from CRM [id number]. Topic: Medications/Prescriptions - Medication Question/Problem  >> Jun 21, 2022  2:40 PM Brunilda Payor wrote:  [name], the patient's nurse, is calling because the script for Azelastine was never sent to [facility], [place] - [address].    Please send as soon as possible.    [name] can be reached at [phone]

## 2022-06-21 NOTE — Telephone Encounter (Signed)
[facility] TELEPHONE TRIAGE     Reason for call: [name] states pharmacy needed to change the brand of patients vitamin gummy. [name] states dosing of one gummy changed from 6 grams to 4 grams. [name] requested to receive verbal order stating "may substitute fiber supplement 4 grams, three times daily". [name] requested to speak to a nurse.    Name of caller: [name] for [facility]  Relationship to patient: PDN  Phone:  [phone]

## 2022-06-21 NOTE — Telephone Encounter (Signed)
Pts father returning call to office  Per Dad, the reason the pharmacy wouldn't have a record of this is because he buys it over the counter  Pt was previously taking a gummy supplement that was 6 mg  He took 2 of them to total 12 mg  Recently, they could only get a tablet that is 4 mg  So pt will now take three 4 mg tablets    Dad passed the phone to [name] (pts PDN)  Writer again confirmed medication change and gave verbal order    No further needs at this time

## 2022-06-21 NOTE — Progress Notes (Signed)
Outgoing paperwork:    Completed paperwork received on  06/21/2022  Sent to scanning? Yes  Sent as outlined in prior documentation: Fax to [phone]    Sherron Ales  12:51 PM  06/21/2022

## 2022-06-21 NOTE — Telephone Encounter (Signed)
Writer spoke with [facility]; No record of change to fiber supplement - reports they have no existing script for this and perhaps family fills OTC.     Attempted to contact Father, [name] to discuss; No answer; Generic voicemail left to call back to clinic to discuss;     Gary Fleet, RN

## 2022-06-22 ENCOUNTER — Ambulatory Visit: Payer: Medicare Other | Admitting: Primary Care

## 2022-06-22 ENCOUNTER — Ambulatory Visit
Payer: Medicare Other | Attending: Pulmonary and Critical Care Medicine | Admitting: Pulmonary and Critical Care Medicine

## 2022-06-22 ENCOUNTER — Other Ambulatory Visit: Payer: Self-pay

## 2022-06-22 ENCOUNTER — Encounter: Payer: Self-pay | Admitting: Pulmonary and Critical Care Medicine

## 2022-06-22 ENCOUNTER — Ambulatory Visit: Payer: Medicare Other

## 2022-06-22 VITALS — BP 104/55 | HR 80 | Temp 96.9°F | Ht 65.0 in

## 2022-06-22 DIAGNOSIS — Z87820 Personal history of traumatic brain injury: Secondary | ICD-10-CM | POA: Insufficient documentation

## 2022-06-22 DIAGNOSIS — R0689 Other abnormalities of breathing: Secondary | ICD-10-CM | POA: Insufficient documentation

## 2022-06-22 DIAGNOSIS — Z930 Tracheostomy status: Secondary | ICD-10-CM

## 2022-06-22 DIAGNOSIS — E119 Type 2 diabetes mellitus without complications: Secondary | ICD-10-CM

## 2022-06-22 DIAGNOSIS — J9610 Chronic respiratory failure, unspecified whether with hypoxia or hypercapnia: Secondary | ICD-10-CM

## 2022-06-22 DIAGNOSIS — Z9911 Dependence on respirator [ventilator] status: Secondary | ICD-10-CM

## 2022-06-22 DIAGNOSIS — K011 Impacted teeth: Secondary | ICD-10-CM

## 2022-06-22 DIAGNOSIS — G8250 Quadriplegia, unspecified: Secondary | ICD-10-CM

## 2022-06-22 NOTE — Progress Notes (Unsigned)
Off vent ETCO2 29, RR 20
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
## 2022-06-22 NOTE — Invasive Procedure Plan of Care (Signed)
CONSENT FOR MEDICAL  OR SURGICAL PROCEDURE                            Patient Name: Angel Wells  [facility] [address]                                                              DOB: Nov 08, 1990         Please read this form or have someone read it to you.   It's important to understand all parts of this form. If something isn't clear, ask [place] to explain.   When you sign it, that means you understand the form and give [place] permission to do this surgery or procedure.     I agree for Om[facility], [facility]t [facility] , and - [facility] attendings: Lenon Ahmadi, Trisha Mangle, Crissie Sickles, Jospeh Fantuzzo, Dahlia Client Chance    - [facility] Residents along with any assistants* they may choose, to treat the following condition(s): Caries, periodontal disease, non-restorable teeth   By doing this surgery or procedure on me: - Removal of teeth #1 and 32  - Removal of the following teeth:   upper right third molar     lower right third molar  - Smoothing of bone as needed  - Any other indicated procedures   This is also known as: - Extraction of teeth #1 and 32  - Alveoloplasty as needed  - Any other indicated procedures   Laterality: Right     *if you'd like a list of the assistants, please ask. We can give that to you.    1. The care provider has explained my condition to me. They have told me how the procedure can help me. They have told me about other ways of treating my condition. I understand the care provider cannot guarantee the result of the procedure. If I don't have this procedure, my other choices are: -Continued monitoring.   -Possible root canal therapy and prosthodontic treatment.   -No treatment.    2. The care provider has told me the risks (problems that can happen) of the procedure. I understand there may be unwanted results. The risks that are related to this procedure include: Pain, bleeding, swelling, infection, damage to adjacent teeth or structures, paresthesia, retained root tips, sinus exposure,  tuberosity/bone fracture, mandible fracture, need for future additional procedures.    3. I understand that during the procedure, my care provider may find a condition that we didn't know about before the treatment started. Therefore, I agree that my care provider can perform any other treatment which they think is necessary and available.    4. I give permission to the hospital and/or its departments to examine and keep tissue, blood, body parts, fluids or materials removed from my body during the procedure(s) to aid in diagnosis and treatment, after which they may be used for scientific research or teaching by appropriate persons. If these materials are used for science or teaching, my identity will be protected. I will no longer own or have any rights to these materials regardless of how they may be used.    5. My care provider might want a representative from a medical device company to be there during my procedure.  I understand that person works for:          The ways they might help my care provider during my procedure include:            6. Here are my decisions about receiving blood, blood products, or tissues. I understand my decisions cover the time before, during and after my procedure, my treatment, and my time in the hospital. After my procedure, if my condition changes a lot, my care provider will talk with me again about receiving blood or blood products. At that time, my care provider might need me to review and sign another consent form, about getting or refusing blood.    I understand that the blood is from the community blood supply. Volunteers donated the blood, the volunteers were screened for health problems. The blood was examined with very sensitive and accurate tests to look for hepatitis, HIV/AIDS, and other diseases. Before I receive blood, it is tested again to make sure it is the correct type.    My chances of getting a sickness from blood products are small. But no transfusion is 100%  safe. I understand that my care provider feels the good I will receive from the blood is greater than the chances of something going wrong. My care provider has answered my questions about blood products.      My decision  about blood or  blood products   Not applicable.        My decision   about tissue  Implants     Not applicable.          I understand this  form.    My care provider  or his/her  assistants have  explained:   What I am having done and why I need it.  What other choices I can make instead of having this done.  The benefits and possible risks (problems) to me of having this done.  The benefits and possible risks (problems) to me of receiving transplants, blood, or blood products.  There is no guarantee of the results.  The care provider may not stay with me the entire time that I am in the operating or procedure room.  My provider has explained how this may affect my procedure. My provider has answered my  questions about this.         I give my  permission for  this surgery or  procedure.            _______________________________________________                                     My signature  (or parent or other person authorized to sign for you, if you are unable to sign for  yourself or if you are under 71 years old)        ______           Date        _____        Time   Electronic Signatures will display at the bottom of the consent form.    Care provider's statement: I have discussed the planned procedure, including the possibility for transfusion of blood  products or receipt of tissue as necessary; expected benefits; the possible complications and risks; and possible alternatives  and their benefits and risks with the patients or the patient's surrogate. In my opinion, the patient or  the patient's surrogate  understands the proposed procedure, its risks, benefits and alternatives.              Electronically signed by: Ernestina Penna, DDS                                                 06/22/2022         Date        10:47 AM        Time

## 2022-06-22 NOTE — Telephone Encounter (Unsigned)
Copied from CRM [id number]. Topic: Medications/Prescriptions - Medication Question/Problem  >> Jun 22, 2022  3:43 PM Abran Duke wrote:  [name], the patient's nurse, is calling because the script for Azelastine was never sent to [facility] [employer], [place] - [address]    [name] can be reached at [phone]

## 2022-06-22 NOTE — Progress Notes (Signed)
Pediatric Pulmonary Follow-up Clinic Note      CC:      HPI:    Hx of infections every few years   Accompanied by RN    Janina Mayo:  Type: Bivona 7.0 cuffed   Changed: Every week due to excessive secretions  Issues: None    Ventilator:  Type: Trilogy   Mode: SIMV PC/PS  Settings: PC 21  PS 13  RR   PEEP 7  FiO2 2L bled in   Use: qhs. TC with PMV or HME during day.   Issues: No recent alarms.      Home Care Company: [facility]    Airway Clearance Regimen:   -Copious secretions requiring suctioning almost hourly  --Starts thick and yellow, becomes thinner and clear as day progresses  -Current regimen: 3x per day  -AM: Duonebs followed by vest  --Following breakfast, cough assist then Pulmicort  -Mid-day: Duoneb and vest  -PM: Duonebs followed by vest  --Later cough assist      Nutrition:  -Eats by mouth. RN admits there is increased gurgling and secretions during meals that intermittently requires suctioning.   -Meds and hydration via G tube.       Medications  Current Outpatient Medications on File Prior to Visit   Medication Sig Dispense Refill    lacosamide (VIMPAT) 10 mg/mL SOLN oral solution Take 10 MILLILITER per tube twice daily. ; MAXIMUM DAILY DOSE 20 MILLILITER 600 mL 5    bisacodyl (DULCOLAX) 10 mg suppository please give suppository every other day, please check with dad prior to giving 12 each 5    Insulin Glargine, 2 Unit Dial, (TOUJEO MAX SOLOSTAR) 300 UNIT/ML injection pen Inject 74 units into the skin nightly  for Type 2 Diabetes Discard each pen 56 days after first use. 9 each 0    insulin aspart (NOVOLOG FLEXPEN) 100 UNIT/ML injection pen Inject 2-10 units into the skin 3 times daily (before meals)  <70 Hold Insulin, give 15g of carb/sugar to treat hypoglycemia.  70-180 No insulin.  181-260 Give 1 Units  261-340 Give 2 units 341-400 Give 3 Units  >400  Give 4 units 45 each 5    levETIRAcetam (KEPPRA) 100 mg/mL solution give 15 MILLILITER by g-tube twice daily 2700 mL 5    metFORMIN (GLUCOPHAGE) 500 MG/5ML  solution administer 5 mls (500 MILLIGRAM total) via g tube 2 times daily (with meals) 473 mL 5    Accu-Chek FastClix Lancets MISC 1 EACH BY MISC.(NON-DRUG COMBO ROUTE) ROUTE 3 (THREE) TIMES DAILY. E11.65 102 each 5    TOBI 300 MG/5ML nebulizer solution Inhale 5 mLs (300 mg total) by nebulization every 12 hours  for pseudomonas infection, trach/vented patient NEBULIZE 1 VIAL (300 MG) TWICE DAILY EVERY OTHER MONTH   DIAGNOSIS: E84.0 280 mL 5    famotidine (PEPCID) 20 mg tablet take 1 tablet by mouth 2 (two) times daily. ok to crush it and put it down tube if necessary (Patient taking differently: Administer 1 tablet (20 mg total) via G tube 2 times daily  TAKE 1 TABLET BY MOUTH 2 (TWO) TIMES DAILY. OK TO CRUSH IT AND PUT IT DOWN TUBE IF NECESSARY) 180 tablet 5    B-D UF III MINI PEN NEEDLES 31G X 5 MM USE 3 (THREE) TIMES DAILY AS NEEDED. 100 each 5    ipratropium (ATROVENT) 0.02 % nebulizer solution INHALE 2.5 MLS (500 MCG TOTAL) BY NEBULIZATION 2 TIMES DAILY FOR ASTHMA DX: J45.4 125 mL 5    blood glucose (  ACCU-CHEK GUIDE) test strip USE 1 STRIP THREE TIMES DAILY. 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  When ill, use four times a day with vesting. 1050 mL 3    cetirizine (ZYRTEC) 10 mg tablet take 1 tablet (10 MILLIGRAM total) by mouth nightly (Patient taking differently: Administer 1 tablet (10 mg total) via G tube nightly) 90 tablet 5    EPINEPHrine (EPIPEN) 0.3 mg/0.3  mL auto-injector use as directed 2 each 5    Docusate Sodium (SILACE) 150 MG/15ML syrup Administer 10 mLs (100 mg total) via G tube 3 times daily  for Constipation 900 mL 5    sodium chloride (OCEAN) 0.65 % nasal spray Spray 1 spray into each nostril 2 times daily as needed for Congestion 45 mL 5    clonazePAM (KLONOPIN) 1 mg tablet take 1 tablet by mouth twice daily as needed (crush for g-tube) for seizures; MAXIMUM DAILY DOSE 2 tabs 10 tablet 0    Adhesive Tape (PAPER TAPE 1"X10YD) TAPE Use as directed. Diagnosis code: G82.50 300 each 5    Catheter Syringes 60 ML MISC Use as directed. Diagnosis code: G82.50 5 each 5    fluticasone (FLONASE) 50 MCG/ACT nasal spray spray 1 spray into nostril daily 16 g 5    SENNA 8.6 MG tablet administer 2 tablets via g tube daily 180 tablet 5    calcium carbonate (TUMS) 500 MG chewable tablet Administer 1 tablet (500 mg total) via G tube daily 30 tablet 11    ACCU-CHEK AVIVA PLUS test strip USE 1 STRIP THREE TIMES DAILY. DX: E11.65 AND Z79.4 100 each 5    ibuprofen (ADVIL,MOTRIN) 600 mg tablet take 1 tablet by mouth 4 times daily as needed for pain 30 tablet 11    sodium chloride 0.9 % nebulizer solution inhale 3 mls by nebulization as needed for wheezing mcaid id [id number] 120 mL 5    carbamide peroxide (DEBROX) 6.5 % otic solution use 5 drops twice daily on the first 3 days of each month. 15 mL 5    nystatin (MYCOSTATIN) powder apply topically 4 times daily  to the following areas: neck, groin, axilla 60 g 0    generic DME Low rate alarms set at 4 breaths, high rate alarm set at 40 breaths, pressure support set at 13 cms 1 each 0    budesonide (PULMICORT) 0.25 mg/47mL nebulizer suspension INHALE 2 ML (CONTENT OF 1 VIAL) VIA NEBULIZER 2 TIMES DAILY FOR ASTHMA 360 mL 5    generic DME Ventilator for home use with humidifier 1 each 0    oxygen concentrator Use  as instructed 1 each 0    generic DME Item to dispense: Bivona silicone cuffed trach tube size 7 80 mm length  Instructions  for use: Use as directed 2 each 5    Nebulizers (PARI PRONEB  MAX NEBULIZER) device Pari pron neb or comperableUse as directed 1 each 0    generic DME Item to dispense: trach care kits  Instructions for use: use as directed 30 each 5    generic DME Item to dispense: trach ties  Instructions for use: use as directed 30 each 5    Gauze Pads & Dressings (SPLIT GAUZE DRAINAGE SPONGE) 4"X4" Use  as instructed. 30 each 5    generic DME Item to dispense: 2X2 gauze 30 each 5    generic DME Item to dispense: stationary pulse oximeter 1 each 0    generic DME Item to dispense: Purple Passey-Muir valve  Instructions for use: Use As Directed 1 each 5    generic DME Vent settings:  SIMV/PC/PS, RATE 10 IP 21. PS 13, PEEP 7, HIGH PRESSURE 50, LOW PRESSURE 5, HIGH RR 40, LOW RR14 1 each 0    suction catheter 14 Fr (ARGYLE) Needs 12 fr. Catheters. Use as directed for tracheal secretions 30 each 11    suction machine Instructions for use: use as directed for clearance of secretions 1 each 0    Oral Hygiene Products (Q-CARE COVERD YANKAUER/SUCTION) MISC Yankauer suction tip to be used when clearing oral secretions 4 each 11    generic DME .Cough Assist and related supplies. Use as directed 1 each 0    baclofen (LIORESAL) 10 mg tablet take 1 tablet (10 milligram total) by mouth 2 times daily (Patient taking differently: Administer 1 tablet (10 mg total) via G tube 2 times daily) 180 tablet 5    GLUCAGON EMERGENCY 1 MG injection kit Use as directed AS NEEDED for blood glucose < 70 1 each 0    generic DME Item to dispense: Multifunction Vent  Instructions for use: use 24 hours a day 1 each 0    generic DME Item to dispense: Portable Suction Machine Repair or Replace  Instructions for use: Use As Directed 1 each 0    valproate (VALPROIC ACID) 50 mg/mL syrup take 5 mls (250 milligram total) by mouth 3 times daily 1350 mL 5    nystatin (MYCOSTATIN) 100,000 Units/mL suspension Swish and swallow 5 mLs (500,000 units total) 4 times daily  for  Candidiasis Fungal Infection of the Oropharynx Swish and hold in mouth before swallowing. 473 mL 0    generic DME Item to dispense: 2 Tracheostomies #7 Bivona Silicone Cuffed 80 mm Length  Instructions for use: use as directed 2 each 6    generic DME Item to dispense: hospital bed mattress Instructions for use: use as directed 1 each 0    generic DME Gel Overlay Mattress.  LON: 99, [id number]. Use as directed. G82.50, S06.9x9 1 each 0    generic DME Item to dispense: Large Volume Nebulizer Pump 50 psi  Instructions for use: use as directed 1 each 0    generic DME Non-sterile gloves size medium Use as directed. MCAID ID [id number] 2boxes/month 3 each 5    nystatin (MYCOSTATIN) 100000 UNIT/GM cream Apply topically 2 times daily  to the following areas: buttocks 15 g 11    sodium chloride 0.9% 0.9 % irrigation Irrigate with 250 mLs as directed 3 times daily 500 mL 0    syringe disposable (BD LUER-LOK) 10 ML Use as instructed for inflation of tracheostomy balloon. 5 each 11    Nebulizer MISC Disposable nebulizer kits to be used inline with trach  MCAID ID [id number] 4 each 5    LACTULOSE 10 GM/15ML solution administer 30 mls (  20 g total) via g tube daily as needed (no stool in 48 hours) use as directed 946 mL 5    oxygen 1-2 L/min to keep O2 sat above 92%, may increase up to 4 lpm as needed.  Low saturation alarm 85%, high alarm off, low HR 50, High HR 200 1 each 0    generic DME Item to dispense: Stationary Continuous Oximeter  Instructions for use: Use at night time Dx: Z99.11 1 each 0    generic DME Cough assist machine repairs for: Philips respironics cough assist model T70. 1 each 0    generic DME Repair, parts, and labor for coughing assist machine. MCAID CIN: [id number] 1 each 0    Misc. Devices (PULSE OXIMETER) MISC Pulse Oximeter with Supplies, LON: 99, [id number], J96.11, Z99.11 1 each 0    Syringe Disposable 60 ML MISC Use as directed 5 each 5    generic DME Adhesive tape 1" x 5 yards. Use as directed. Disp 3 box 3  each 5    generic DME Suction canister Use as directed.MCAID ID [id number] 4 each 5    generic DME Suction tubing Use as directed.MCAID ID [id number] 4 each 5    suction machine Instructions for use: Use as needed for secretions, LON: 99 months, [id number] 1 each 0    generic DME Pulse ox probe replacement tape. Use as directed.[id number], LON 99 15 each 5    generic DME Pulse ox sensors (disposable)Use as directed.MCAID ID [id number], LON 99 4 each 5    generic DME Hill Rom Percussion Vest Model # 105  55inch  Use as directed. 1 each 0    Cholecalciferol 25 MCG (1000 UT) CHEW Take 1,000 units by mouth daily  2 chews daily      Non-System Medication Ok to change Gtube at home by nursing staff every 3 months and PRN 1 each 0    MIC-KEY gastrostomy kit 14FR Please dispense every 3 months and prn, MicKey 14Fr 2.5cm 1 kit 3    lactobacillus rhamnosus, GG, (CULTURELLE) capsule Take 1 capsule (1 each total) by mouth daily 30 capsule 5    triamcinolone (KENALOG) 0.025 % cream Apply topically 2 times daily  APPLY TO AFFECTED AREA 15 g 2    zinc oxide (DESITIN) 40 % paste Apply topically as needed for Diaper Rash 57 g 1    generic DME TRACH TIES WITH O2 PORT. Use as directed.MCAID ID [id number] 30 each 11    MIC-KEY gastrostomy feeding tube extension set 12" Use as directed with gtube 1 kit 11    generic DME Adult size trach ties Use as directed. 30 each 5    generic DME Butterfly [employer]ness for use in wheelchair, dispense 1. Use as directed. Dx G80.0, S06.9x9, LON: lifetime 1 each 0    MIC-KEY gastrostomy feeding tube extension set 24" Use as directed, please dispense 4 per month 4 kit 5    gauze pads 2"X2" Use as directed around trach. MCAID ID [id number] 100 each 5    Nebulizers (PARI LC PLUS VIOS PRO NEB) MISC Pari Vios Pro Neb or comparable with Nebulizer kits and nebulizer trach mask. Use twice daily With albuterol.MCAID # [id number], MCARE [id number] 1 each 0    generic DME 15mm Adapter for trach to be used as directed. Replace  weekly. Dx.Z99.11 4 each 5    generic DME 15 mm adapters for nebulized therapies via trach  Medicare ID [id number], Medicaid ID [id number] 4 each 11  nebulizer device with mask and tubing Use as directed 1 kit 0    generic DME Please dispense one 50mL syringe. Use as directed. 1 each 5    generic DME MCAID ID [id number], BIVONA CUFFED TRACH 7.0 Please change trach every month and as needed. 1 each 5    generic DME Please dispense 30mL measuring cup. Use as directed. 1 each 5    Gauze Pads & Dressings (SPLIT GAUZE DRAINAGE SPONGE) 4"X4" Use once and PRN times a day as instructed. 30 each 5    generic DME 10 isothermal adult "antiflect" connector. MCAID [id number] Use as directed. 10 each 5    generic DME Suction catheter with chimney valve.Use as directed.MCAID ID [id number] 1 each 0    gauze sponge (CURITY) 4"X4" pads Use PRN as instructed.MCAID ID [id number] 50 each 5    generic DME PASSEY MUIR VALVE (PURPLE) Use as tolerated.MCAID ID [id number] 1 each 3    Adhesive Tape 1"x5yd TAPE By 1 Units no specified route as needed MCAID ID [id number] 3 each 5    generic DME 10 ml syringes Use as directed.MCAID ID [id number] 2 each 5    generic DME Trach care kits Use as directed.MCAID ID [id number] 30 each 5    surgical lubricant (SURGILUBE) gel Apply topically as needed MCAID ID [id number] 60 g 5    Distilled Water LIQD Distilled water, use as directedMCAID ID [id number] 10 Bottle 5    generic DME Mepilex 4X 4. Use as directed.MCAID ID [id number] 1 each 5    generic DME HME for trach humidification Use as directed.MCAID ID [id number] 30 each 5    generic DME Cough Assist circuits Use as directed.MCAID ID [id number] 1 each 5    generic DME Suction filter Use as directed.MCAID ID [id number] 2 each 5    generic DME Nasal adapters Use as directed.MCAID ID [id number] 5 each 5    diphenhydrAMINE (BENADRYL) 25 MG oral solid Administer 1 each (25 mg total) via G tube nightly as needed for Sleep      FIBER SELECT GUMMIES PO Take by mouth Give 2 gummies daily       Multiple Vitamins-Minerals (HM MULTIVITAMIN ADULT GUMMY PO) Take by mouth Give 2 gummies daily      Ascorbic Acid (VITAMIN C ADULT GUMMIES PO) Take 250 mg by mouth daily  Give 2 gummies once daily       oxygen 2 l/m bleed through ventilator at night 1 each 0    miconazole (MICATIN) 2 % powder 2 times daily as needed       insulin pen needle (FIFTY50 PEN NEEDLES) 31G X 8 MM 1 each      Docusate Sodium (SILACE) 150 MG/15ML syrup take 10 mls (100 MILLIGRAM total) by mouth 3 times daily  hold for loose stool (Patient not taking: Reported on 06/22/2022) 900 mL 5    Docusate Sodium (SILACE) 150 MG/15ML syrup Take 10 mLs (100 mg total) by mouth 3 times daily  Hold for loose stool 900 mL 5    calcium carbonate 250 mg/mL (100 mg/mL elemental calcium) suspension Administer 3 mLs (750 mg total) via G tube daily 500 mL 5    calcium carbonate 650 mg tablet take 1 tablet (650 MILLIGRAM total) by mouth daily via g tube 30 tablet 5     No current facility-administered medications on file prior to visit.         Allergies  Allergies   Allergen Reactions    Augmentin [  Amoxicillin-Pot Clavulanate] Other (See Comments)     Unknown reactions    Lorazepam Other (See Comments)     Unknown    Piperacillin-Tazobactam In Dex Other (See Comments)     BP drops    Sulfamethoxazole-Trimethoprim Anaphylaxis     Unknown    Vancomycin Other (See Comments)     unknown       ROS  10 pt ROS completed and is as per HPI or otherwise negative.     Physical Exam  Vitals:    06/22/22 1440   BP: 104/55   Pulse: 80   Temp: 36.1 C (96.9 F)   TempSrc: Temporal   SpO2: 96%   Height: 1.651 m (5\' 5" )       General: Pleasant young ***   HEENT: NC/AT. PERRL. Conjunctivae not injected. No nasal drainage seen. MMM. Oropharynx with no apparent lesions.   Neck: Supple. No cervical or supraclavicular lymphadenopathy.   CV: RRR. No murmur appreciated.   Lungs: Breathing comfortably. CTAB with no wheezes, rales, or rhonchi.   Abdomen: +BS. Soft, NT/ND.   Ext: No  cyanosis, clubbing, or LE edema.   Skin: No significant rash.   Neuro: Awake and alert. Answers questions appropriately. MAEs. No obvious motor deficit.       Labs  Reviewed and notable for:      Imaging      PFT:  FEV1:  FVC:  FEV1/FVC:  TLC:    Impression:      Assessment      Plan      Thank you for allowing [place] to partake in the care of ***. We will re-evaluate *** in *** month. In the interim, please call with any questions or concerns.     Jobe Gibbon, MD  Pediatric Pulmonary Fellow

## 2022-06-22 NOTE — Patient Instructions (Signed)
ORAL AND MAXILLOFACIAL SURGERY  Pre-procedure Instructions for Local Anesthetics (numbing medicine)    You are scheduled to have a procedure with local anesthetics (numbing medicine) in the Oral and Maxillofacial Surgery Clinic.  To get to the clinic take the Silver elevators in the main lobby of the hospital to the 4th floor.  The clinic is the last waiting room at the end.  Please check in with the receptionist upon arrival.  When you check in you MUST have  (If you need to take medications you may do it up to 2 hours before your appointment with no more than ¼ cup of water only)   • If you are under the age of 18, you MUST have a legal parent or guardian with you on arrival.  (this is the person who makes legal decisions for you)  • Do not wear facial piercings (tongue, lip, cheek), nail polish or makeup.  If you do the procedure will be cancelled if it cannot be removed.  This is for your safety.    Local anesthetics (numbing medicine) are used to decrease any pain you might have at the site of the surgery.  You will be fully awake, just numb at the procedure site.    If you are late for your appointment (more than 15 minutes) it will be cancelled and rescheduled at the discretion of the provider.  The above instructions are for your safety and those of the providers in the procedure.  If you fail to follow these instructions the procedure will be cancelled.   Every effort will be made to ensure that your procedure will begin at the scheduled time.  However, there are occasions when a procedure may take longer than expected and your procedure may be delayed.  If you have any questions or concerns, please feel free to call the office between 9am -5pm.  [phone]

## 2022-06-23 DIAGNOSIS — Z87820 Personal history of traumatic brain injury: Secondary | ICD-10-CM | POA: Insufficient documentation

## 2022-06-23 NOTE — Progress Notes (Unsigned)
[facility]    OFFICE VISIT NOTE  SUBJECTIVE   TBI/Spastic quadriplegia  Routine follow up visit - no acute issues  Lives at home - 24 hour nursing support  Tracheostomy - nocturnal ventilation - seeing pulmonary in clinic today  No skin issues  Seizures OK - on usual medications    Type II Diabetes  Dose of Lantus decreased from 100 to 74 units about 2 weeks ago  BGs great since then - AMs typically 100-120  Also on Metformin and Novolog coverage scale - not needing any coverage insulin        Current Outpatient Medications   Medication Sig    Probiotic Product (PROBIOTIC GUMMIES) 30 MG CHEW Take 30 mg by mouth daily    EPINEPHrine (EPIPEN JR) 0.15 mg/0.3 mL auto-injector Inject 0.3 mLs (0.15 mg total) into the muscle as needed for Anaphylaxis    Acetaminophen 500 mg tablet Administer 1 tablet (500 mg total) via G tube 3 times daily as needed for Pain or Fever    lacosamide (VIMPAT) 10 mg/mL SOLN oral solution Take 10 MILLILITER per tube twice daily. ; MAXIMUM DAILY DOSE 20 MILLILITER    bisacodyl (DULCOLAX) 10 mg suppository please give suppository every other day, please check with dad prior to giving    Insulin Glargine, 2 Unit Dial, (TOUJEO MAX SOLOSTAR) 300 UNIT/ML injection pen Inject 74 units into the skin nightly  for Type 2 Diabetes Discard each pen 56 days after first use. (Patient taking differently: Inject 84 units into the skin nightly  for Type 2 Diabetes Discard each pen 56 days after first use.)    insulin aspart (NOVOLOG FLEXPEN) 100 UNIT/ML injection pen Inject 2-10 units into the skin 3 times daily (before meals)  <70 Hold Insulin, give 15g of carb/sugar to treat hypoglycemia.  70-180 No insulin.  181-260 Give 1 Units  261-340 Give 2 units 341-400 Give 3 Units  >400  Give 4 units    levETIRAcetam (KEPPRA) 100 mg/mL solution give 15 MILLILITER by g-tube twice daily    metFORMIN (GLUCOPHAGE) 500 MG/5ML solution administer 5 mls (500 MILLIGRAM total) via g tube 2 times daily  (with meals)    Accu-Chek FastClix Lancets MISC 1 EACH BY MISC.(NON-DRUG COMBO ROUTE) ROUTE 3 (THREE) TIMES DAILY. E11.65    TOBI 300 MG/5ML nebulizer solution Inhale 5 mLs (300 mg total) by nebulization every 12 hours  for pseudomonas infection, trach/vented patient NEBULIZE 1 VIAL (300 MG) TWICE DAILY EVERY OTHER MONTH   DIAGNOSIS: E84.0    famotidine (PEPCID) 20 mg tablet take 1 tablet by mouth 2 (two) times daily. ok to crush it and put it down tube if necessary (Patient taking differently: Administer 1 tablet (20 mg total) via G tube 2 times daily  TAKE 1 TABLET BY MOUTH 2 (TWO) TIMES DAILY. OK TO CRUSH IT AND PUT IT DOWN TUBE IF NECESSARY)    B-D UF III MINI PEN NEEDLES 31G X 5 MM USE 3 (THREE) TIMES DAILY AS NEEDED.    ipratropium (ATROVENT) 0.02 % nebulizer solution INHALE 2.5 MLS (500 MCG TOTAL) BY NEBULIZATION 2 TIMES DAILY FOR ASTHMA DX: J45.4    blood glucose (ACCU-CHEK GUIDE) test strip USE 1 STRIP THREE TIMES DAILY.    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  When ill, use four times a day with vesting.    cetirizine (ZYRTEC) 10 mg tablet take 1 tablet (10 MILLIGRAM total) by mouth nightly (Patient taking differently: Administer 1 tablet (10 mg total) via G tube nightly)    EPINEPHrine (EPIPEN) 0.3 mg/0.3 mL auto-injector use as directed    calcium carbonate 250 mg/mL (100 mg/mL elemental calcium) suspension Administer 3 mLs (750 mg total) via G tube daily    sodium chloride (OCEAN) 0.65 % nasal spray Spray 1 spray into each nostril 2 times daily as needed for Congestion     clonazePAM (KLONOPIN) 1 mg tablet take 1 tablet by mouth twice daily as needed (crush for g-tube) for seizures; MAXIMUM DAILY DOSE 2 tabs    Adhesive Tape (PAPER TAPE 1"X10YD) TAPE Use as directed. Diagnosis code: G82.50    fluticasone (FLONASE) 50 MCG/ACT nasal spray spray 1 spray into nostril daily    SENNA 8.6 MG tablet administer 2 tablets via g tube daily    calcium carbonate 650 mg tablet take 1 tablet (650 MILLIGRAM total) by mouth daily via g tube    ibuprofen (ADVIL,MOTRIN) 600 mg tablet take 1 tablet by mouth 4 times daily as needed for pain (Patient taking differently: Administer 1 tablet (600 mg total) via G tube 4 times daily as needed for Pain or Fever)    sodium chloride 0.9 % nebulizer solution inhale 3 mls by nebulization as needed for wheezing mcaid id [id number]    carbamide peroxide (DEBROX) 6.5 % otic solution use 5 drops twice daily on the first 3 days of each month.    nystatin (MYCOSTATIN) powder apply topically 4 times daily  to the following areas: neck, groin, axilla    generic DME Low rate alarms set at 4 breaths, high rate alarm set at 40 breaths, pressure support set at 13 cms    budesonide (PULMICORT) 0.25 mg/95mL nebulizer suspension INHALE 2 ML (CONTENT OF 1 VIAL) VIA NEBULIZER 2 TIMES DAILY FOR ASTHMA    generic DME Ventilator for home use with humidifier    generic DME Item to dispense: Bivona silicone cuffed trach tube size 7 80 mm length  Instructions for use: Use as directed    generic DME Item to dispense: trach care kits  Instructions for use: use as directed    generic DME Item to dispense: trach ties  Instructions for use: use as directed    Gauze Pads & Dressings (SPLIT GAUZE DRAINAGE SPONGE) 4"X4" Use  as instructed.    generic DME Item to dispense: 2X2 gauze    generic DME Item to dispense: stationary pulse oximeter    generic DME Item to dispense: Purple Passey-Muir valve  Instructions for use: Use As Directed    generic DME Vent settings:  SIMV/PC/PS, RATE 10 IP 21. PS 13,  PEEP 7, HIGH PRESSURE 50, LOW PRESSURE 5, HIGH RR 40, LOW YN82    generic DME .Cough Assist and related supplies. Use as directed    baclofen (LIORESAL) 10 mg tablet take 1 tablet (10 milligram total) by mouth 2 times daily (Patient taking differently: Administer 1 tablet (10 mg total) via G tube 2 times daily)    GLUCAGON EMERGENCY 1 MG injection kit Use as directed AS  NEEDED for blood glucose < 70 (Patient taking differently: Inject 1 mg into the skin once as needed for Low blood sugar (per med review sheet for BG <60))    valproate (VALPROIC ACID) 50 mg/mL syrup take 5 mls (250 milligram total) by mouth 3 times daily    generic DME Item to dispense: 2 Tracheostomies #7 Bivona Silicone Cuffed 80 mm Length  Instructions for use: use as directed    generic DME Gel Overlay Mattress.  LON: 99, [id number]. Use as directed. G82.50, S06.9x9    generic DME Non-sterile gloves size medium Use as directed. MCAID ID [id number] 2boxes/month    sodium chloride 0.9% 0.9 % irrigation Irrigate with 250 mLs as directed 3 times daily    LACTULOSE 10 GM/15ML solution administer 30 mls (20 g total) via g tube daily as needed (no stool in 48 hours) use as directed    oxygen 1-2 L/min to keep O2 sat above 92%, may increase up to 4 lpm as needed.  Low saturation alarm 85%, high alarm off, low HR 50, High HR 200    Misc. Devices (PULSE OXIMETER) MISC Pulse Oximeter with Supplies, LON: 99, O121283, J96.11, Z99.11    generic DME [place] Percussion Vest Model # 105  55inch  Use as directed.    Cholecalciferol 25 MCG (1000 UT) CHEW Take 1,000 units by mouth daily  2 chews daily    MIC-KEY gastrostomy kit 14FR Please dispense every 3 months and prn, MicKey 14Fr 2.5cm    lactobacillus rhamnosus, GG, (CULTURELLE) capsule Take 1 capsule (1 each total) by mouth daily    triamcinolone (KENALOG) 0.025 % cream Apply topically 2 times daily  APPLY TO AFFECTED AREA    MIC-KEY gastrostomy feeding tube extension set 12" Use as directed with gtube     MIC-KEY gastrostomy feeding tube extension set 24" Use as directed, please dispense 4 per month    generic DME PASSEY MUIR VALVE (PURPLE) Use as tolerated.MCAID ID [id number]    diphenhydrAMINE (BENADRYL) 25 MG oral solid Administer 1 each (25 mg total) via G tube nightly as needed for Sleep    Ascorbic Acid (VITAMIN C ADULT GUMMIES PO) Take 250 mg by mouth daily  Give 2 gummies once daily     insulin pen needle (FIFTY50 PEN NEEDLES) 31G X 8 MM 1 each       Medications reviewed at today's visit    OBJECTIVE   PHYSICAL EXAM  GEN:  Alert, no distress  HEENT:  Conjunctiva not injected, nasal mucosa normal, no discharge, oral mucosa moist, oropharynx not erythematous  LYMPH:  No cervical or supraclavicular lymphadenopathy  CV: RRR, normal S1/S2, no murmurs   PULM: no increased work of breathing, clear to ascultation bilaterally  ABD:   Normal bowel sounds, soft, non-tender, no rebound or guarding, no HSM.   EXT: No edema  NEURO:  spasticity X 4.  Spasms noted    ASSESSMENT       ICD-10-CM ICD-9-CM   1. History of traumatic brain injury  Z87.820 V15.52   2. Spastic quadriplegia  G82.50 344.00   3. Type 2 diabetes mellitus  E11.9 250.00         Health Maintenance Due   Topic Date Due    IMM-Hepatitis B Vaccine (1 of 3 - 3-dose series) Never done    HIV Screening USPSTF/[place]  Never done    Diabetic Eye Exam ADA  Never done    Diabetic Foot Exam ADA  07/06/2022  PLAN   TBI  Spastic quadriplegia  Doing well at home with supports in place  Spasticity/spasms present but on Baclofen and not bothersome  No wound issues  Should have baseline DEXA scan and Vitamin D level    Type II Diabetes  Continue current regimen - working with PharmD here to hopefully come down (and maybe off Lantus).  Will likely need titration of Metformin and consideration of a GLP1 agonist to come off all insulin      Orders Placed This Encounter   No orders placed during this encounter.         No follow-ups on file.        Marchia Meiers, MD

## 2022-06-26 ENCOUNTER — Telehealth: Payer: Self-pay | Admitting: Pediatrics

## 2022-06-26 ENCOUNTER — Encounter: Payer: Self-pay | Admitting: Pulmonary and Critical Care Medicine

## 2022-06-26 MED ORDER — AZELASTINE HCL 137 MCG/SPRAY NA SOLN *I*
2.0000 | Freq: Two times a day (BID) | NASAL | 3 refills | Status: DC | PRN
Start: 2022-06-26 — End: 2022-10-27

## 2022-06-26 NOTE — Telephone Encounter (Signed)
Copied from CRM [id number]. Topic: Medications/Prescriptions - Prescription Status/Information  >> Jun 26, 2022 10:05 AM Rulon Abide wrote:  [name] , Caregiver, patients Primary RN , is calling to check the status of the patients medication refill request for Azelastine. They are requesting a return call at [phone]    [name] is looking for an update as she has been calling since 12/12 and was told this would be sent in from his last appointment visit on 12/11. She would like to know what is taking so long.     She would like to make sure it will go to [facility] [place] - [address]  Please see encounter on 12/12 for other inquiry's

## 2022-06-27 NOTE — Telephone Encounter (Signed)
Writer called to let [name], RN know that the prescription for Azelastine Nasal Spray was sent yesterday afternoon to [facility]. Writer apologized for the delay in care. [name] was appreciative of the phone call letting her know. They will trial the nasal spray and let the office know how it goes.

## 2022-06-28 ENCOUNTER — Encounter: Payer: Self-pay | Admitting: Gastroenterology

## 2022-06-28 ENCOUNTER — Encounter: Payer: Self-pay | Admitting: Oncology

## 2022-06-28 NOTE — Progress Notes (Signed)
Incoming paperwork for completion to the [facility]  Date Received: 06/28/2022    Type of paperwork:   Sender/organization: [facility]  Form type: Home Health Certification and Plan of Care    Completed paperwork to be sent to:  Fax to [phone]    Placed in box for: Pricilla Loveless  3:47 PM  06/28/2022

## 2022-06-28 NOTE — Progress Notes (Signed)
Incoming paperwork for completion to the [facility]  Date Received: 06/28/2022    Type of paperwork:   Sender/organization: [facility]  Form type: Clarification Telephone Orders/Verbal Orders    Completed paperwork to be sent to:  Fax to [phone]    Placed in box for: Pricilla Loveless  4:20 PM  06/28/2022

## 2022-06-30 ENCOUNTER — Telehealth: Payer: Self-pay

## 2022-06-30 NOTE — Telephone Encounter (Signed)
[name] caregiver called to sched LA/Ext. Writer informed caller that there are no available appts at this time and suggested calling back. Caller wanted to send a message to Dr.Brooks that they got consent signed. Writer will pass this message along.

## 2022-07-04 ENCOUNTER — Encounter: Payer: Self-pay | Admitting: Gastroenterology

## 2022-07-04 ENCOUNTER — Telehealth: Payer: Self-pay

## 2022-07-04 NOTE — Telephone Encounter (Signed)
Called pt caregiver, [name]. She says that the nurse has already answered her questions. No additional questions at this time.    Gayla Medicus, DDS  OMFS Resident

## 2022-07-04 NOTE — Telephone Encounter (Signed)
Writer called patient's caregiver [name] back who had questions patient's baseline pain tolerance as patient has had a TBI as a child that has impacted his ability to verbalize or identify pain. Caregiver inquired if patient's home prescription of Tylenol would interfere with patient's ability to have procedure. Writer notified [name] that this shouldn't impact ability to do procedure if patient takes Tylenol prior to procedure, however this is often one of the medications that is ordered post-procedure for pain management. [name] verbalized understanding of things discussed.

## 2022-07-04 NOTE — Telephone Encounter (Signed)
Patient is scheduled for Local 09/19/21 AM.  Patient's care giver called with concerns prior to appointment. [name] is concerned if the patient will be completely numb since there some difficulty discussing pain with the patient.     Please call Caregiver [name] : [phone]

## 2022-07-05 NOTE — Progress Notes (Signed)
Outgoing paperwork:    Completed paperwork received on  07/05/2022  Sent to scanning? Yes  Sent as outlined in prior documentation: Fax to [phone]    Sherron Ales  9:29 AM  07/05/2022

## 2022-07-05 NOTE — Progress Notes (Signed)
Outgoing paperwork:    Completed paperwork received on  07/05/2022  Sent to scanning? Yes  Sent as outlined in prior documentation: Fax to [phone]    Sherron Ales  2:25 PM  07/05/2022

## 2022-07-07 ENCOUNTER — Ambulatory Visit: Payer: Medicare Other

## 2022-07-07 ENCOUNTER — Telehealth: Payer: Self-pay | Admitting: Oncology

## 2022-07-07 NOTE — Telephone Encounter (Signed)
[facility] TELEPHONE TRIAGE     Reason for call: [name] states patient started taking a newly prescribed medication from patients last appointment. [name] inquired if patient should schedule follow up with patients PCP.     Name of caller: [name] for Jannie Wenrick  Relationship to patient: PDN  Phone: [phone]

## 2022-07-11 ENCOUNTER — Telehealth: Payer: Self-pay | Admitting: Oncology

## 2022-07-11 NOTE — Telephone Encounter (Signed)
[facility] TELEPHONE TRIAGE     Reason for call: [name] requested to speak directly to nurse. [name] requested to review continuation of patients medication orders. [name] scheduled patient for three month follow up with PCP for medication review.      Name of caller: [name] for [name]  Relationship to patient: PDN  Phone:  [phone]

## 2022-07-12 ENCOUNTER — Ambulatory Visit: Payer: Medicare Other | Admitting: Optometrist

## 2022-07-12 ENCOUNTER — Encounter: Payer: Self-pay | Admitting: Optometrist

## 2022-07-12 ENCOUNTER — Other Ambulatory Visit: Payer: Self-pay

## 2022-07-12 DIAGNOSIS — E119 Type 2 diabetes mellitus without complications: Secondary | ICD-10-CM

## 2022-07-12 MED ORDER — BISACODYL 10 MG RE SUPP *I*
10.0000 mg | Freq: Every day | RECTAL | 3 refills | Status: DC | PRN
Start: 2022-07-12 — End: 2022-07-21

## 2022-07-12 MED ORDER — METFORMIN HCL 500 MG/5ML PO SOLN *I*
1000.0000 mg | Freq: Two times a day (BID) | ORAL | 5 refills | Status: DC
Start: 2022-07-12 — End: 2023-01-29

## 2022-07-12 NOTE — Progress Notes (Signed)
Assessment:  DM type II x 5-10 years   - dilated fundus views difficult due to fixation today  Lab Results   Component Value Date    HA1C 5.1 06/19/2022     2. Low myopia/astigmatism OU   -pt asymptomatic     Plan:  Proper glycemic and hypertensive control advised. Pt educated on importance of yearly dilated exams.  2. Pt ed on findings; will continue to monitor for progression. Re-check yearly, sooner prn.    RTC 1 year for DM eye exam, sooner prn.

## 2022-07-12 NOTE — Addendum Note (Signed)
Addended byOleh Genin on: 07/12/2022 02:12 PM     Modules accepted: Orders

## 2022-07-12 NOTE — Addendum Note (Signed)
Addended by: Homero Fellers A on: 07/12/2022 02:28 PM     Modules accepted: Orders

## 2022-07-12 NOTE — Telephone Encounter (Signed)
Angel Wells requests that pharmacy receives new prescription for Metformin. States patient should be taking 1000mg  or 10ml twice daily instead Metformin 500mg  or 5 ml.     Writer sees that this was requested to be adjusted in November. Caregiver would like to make sure pt has adequate length of time on correct dose before nest visit with PCP in March.      Dulcolax needs a PRN order to give if no BM after 2 days in addition to the current routine order of every other day.

## 2022-07-13 ENCOUNTER — Telehealth: Payer: Self-pay | Admitting: Pediatrics

## 2022-07-13 ENCOUNTER — Other Ambulatory Visit: Payer: Self-pay | Admitting: Oncology

## 2022-07-13 DIAGNOSIS — J4540 Moderate persistent asthma, uncomplicated: Secondary | ICD-10-CM

## 2022-07-13 NOTE — Telephone Encounter (Signed)
Copied from CRM [id number]. Topic: Access to Care - Speak to Provider/Office Staff  >> Jul 13, 2022 10:34 AM Quentin Mulling wrote:  [name]; patients nurse is calling in regarding the medication azelastine (ASTELIN) 0.1 % nasal spray.  Caller states that the medication is working very well and is relieving the eye puffiness, redness, and nasal congestion and they would like to continue on the medication and [name] would like a call back from the nurses to discuss . Kate/ nurse  [phone]

## 2022-07-13 NOTE — Telephone Encounter (Signed)
Contacted [name] to let her know that there is nothing on file giving [facility] permission to discuss anything with her office. Will send ROI to father to have him fill out and return. Nurse stated that dad was with her and could give writer a verbal okay to discuss. Writer declined and stated ROI would be sent.

## 2022-07-14 ENCOUNTER — Telehealth: Payer: Self-pay | Admitting: Optometrist

## 2022-07-14 NOTE — Telephone Encounter (Signed)
Patient is interested in purchasing readers and needs to know what power Dr. Everardo Beals recommends.    Patient contact [name] can be reached at [phone].    Thank you

## 2022-07-17 ENCOUNTER — Telehealth: Payer: Self-pay | Admitting: Oncology

## 2022-07-17 ENCOUNTER — Encounter: Payer: Self-pay | Admitting: Gastroenterology

## 2022-07-17 NOTE — Telephone Encounter (Signed)
Angel Wells walked into office today wanting to speak with a nurse regarding [name] Dulcolax orders apparently the orders that the pharmacy have are different then the ones nurses have? She needs to get that  clarified as soon as possible. Please call her back at [phone]  Thank you

## 2022-07-18 NOTE — Telephone Encounter (Signed)
Angel Wells called back to get update on what was discussed yesterday  Angel Wells would like a call back but will be available after 3 pm  Please advise      Phone: [phone]

## 2022-07-18 NOTE — Telephone Encounter (Addendum)
As per [facility] Nurse Manager, as per continuity of care, [facility] is the Home Health Nursing Team of record for the last 5 years in the Home with the Patient.   Spoke with [facility] Clinical Director   [employer] who will facilitate to get the signature from Patient's Father Brett Canales to add Jacques Earthly, RN on to the ROI for the Patient which already exists for the Agency.   Eunice Blase will provide the signed ROI to this Clinical research associate.    In the meantime, the prupose of Kate's call was to inform Dr. Venetia Maxon that the Azelastine prescribed by Dr. Venetia Maxon is truly helping him and, he plans to continue it's use.  This Clinical research associate noted that there are refills on the Rx and [name] is aware of this as well.   Thanks!

## 2022-07-18 NOTE — Telephone Encounter (Signed)
Writer spoke with Father, [name] and inquired on errors in Doculax orders. Per Father, he is unaware of any issues with patient prescription. Writer notified Father that staff received a call from [place], [place] RN with questions.     Per Quintella Reichert is off today but he requested [facility] RN's call him tomorrow to discuss after he has an opportunity to discuss with [name] and review orders.     Gary Fleet, RN

## 2022-07-18 NOTE — Telephone Encounter (Signed)
Writer contacted [place], [place]; Reminded [name] that Angel Wells, [place] Father has not given [place] permission to speak with her regarding medication needs. All communications are required to go through parent/guardian who will communicate this to our office. [name] verbalized understanding.     Gary Fleet, RN

## 2022-07-19 ENCOUNTER — Other Ambulatory Visit: Payer: Self-pay | Admitting: Oncology

## 2022-07-19 NOTE — Telephone Encounter (Signed)
[name] called says dad is with her now if someone could give her a call back. Phone number [phone]

## 2022-07-20 ENCOUNTER — Encounter: Payer: Self-pay | Admitting: Gastroenterology

## 2022-07-21 ENCOUNTER — Other Ambulatory Visit: Payer: Self-pay | Admitting: Oncology

## 2022-07-21 DIAGNOSIS — Z9911 Dependence on respirator [ventilator] status: Secondary | ICD-10-CM

## 2022-07-21 DIAGNOSIS — J9600 Acute respiratory failure, unspecified whether with hypoxia or hypercapnia: Secondary | ICD-10-CM

## 2022-07-21 NOTE — Telephone Encounter (Signed)
Call transferred to writer  [name], RN and dad on speaker phone    Prescription for Dulcolax   10mg  suppository     Sig needs to be changed to:  Place 1 suppository (10mg ) rectally every other day and PRN if no BM in 2 days    Pended RX  Sent to PCP    Leonard Schwartz, RN

## 2022-07-21 NOTE — Addendum Note (Signed)
Addended by: Leonard Schwartz on: 07/21/2022 10:55 AM     Modules accepted: Orders

## 2022-07-25 MED ORDER — BISACODYL 10 MG RE SUPP *I*
10.0000 mg | RECTAL | 3 refills | Status: DC
Start: 2022-07-25 — End: 2022-11-16

## 2022-07-25 NOTE — Addendum Note (Signed)
Addended by: Homero Fellers A on: 07/25/2022 09:03 AM     Modules accepted: Orders

## 2022-07-28 ENCOUNTER — Other Ambulatory Visit: Payer: Self-pay | Admitting: Oncology

## 2022-07-28 DIAGNOSIS — J4540 Moderate persistent asthma, uncomplicated: Secondary | ICD-10-CM

## 2022-07-30 ENCOUNTER — Other Ambulatory Visit: Payer: Self-pay

## 2022-08-07 ENCOUNTER — Encounter: Payer: Self-pay | Admitting: Student in an Organized Health Care Education/Training Program

## 2022-08-07 NOTE — Progress Notes (Signed)
Incoming paperwork for completion to the [facility]  Date Received: 08/07/2022    Type of paperwork:   Sender/organization: [facility]  Form type: Certification of Medical Necessity     Completed paperwork to be sent to:  Fax to [phone]    Placed in box for:  Kyra Manges  4:32 PM  08/07/2022

## 2022-08-07 NOTE — Progress Notes (Signed)
Incoming paperwork for completion to the [facility]  Date Received: 08/07/2022    Type of paperwork:   Sender/organization: [facility]  Form type: Physician Order    Completed paperwork to be sent to:  Fax to [phone]    Placed in box for:  Angel Wells  2:21 PM  08/07/2022

## 2022-08-09 ENCOUNTER — Encounter: Payer: Self-pay | Admitting: Student in an Organized Health Care Education/Training Program

## 2022-08-09 NOTE — Progress Notes (Signed)
Incoming paperwork for completion to the [facility]  Date Received: 08/09/2022    Type of paperwork:   Sender/organization: [facility]  Form type: Plan of Care    Copy and scan before mailing? No    Completed paperwork to be sent to:  Fax to [phone]    Placed in box for:  Fredrich Romans  3:47 PM  08/09/2022

## 2022-08-09 NOTE — Progress Notes (Signed)
Incoming paperwork for completion to the [facility]  Date Received: 08/09/2022    Type of paperwork:   Sender/organization: [employer]  Form type: Physician Order    Copy and scan before mailing? No    Completed paperwork to be sent to:  Fax to [phone]    Placed in box for:  Fredrich Romans  3:28 PM  08/09/2022

## 2022-08-11 NOTE — Progress Notes (Signed)
Outgoing paperwork:    Completed paperwork received on  08/11/2022  Sent to scanning? Yes  Sent as outlined in prior documentation: Fax to [phone]    Angel Wells  10:31 AM  08/11/2022

## 2022-08-11 NOTE — Progress Notes (Signed)
Outgoing paperwork:    Completed paperwork received on  08/11/2022  Sent to scanning? Yes  Sent as outlined in prior documentation: Fax to [phone]    Sherron Ales  10:21 AM  08/11/2022

## 2022-08-16 ENCOUNTER — Telehealth: Payer: Self-pay | Admitting: Student in an Organized Health Care Education/Training Program

## 2022-08-16 DIAGNOSIS — Z87820 Personal history of traumatic brain injury: Secondary | ICD-10-CM

## 2022-08-16 NOTE — Telephone Encounter (Signed)
[facility] TELEPHONE TRIAGE     Reason for call: Irving Burton requested to receive a new referral for patient to continue receiving speech services at [facility]. Irving Burton requests referral is placed for "speech and swallow evaluation" and faxed to the number provided below. Referral pended for provider review.     Name of caller: Lourena Simmonds for Angel Wells  Relationship to patient: Speech Therapist   Organization: [employer]  Phone: [phone]  Fax: [phone]

## 2022-08-18 ENCOUNTER — Telehealth: Payer: Self-pay

## 2022-08-18 ENCOUNTER — Other Ambulatory Visit: Payer: Self-pay | Admitting: Student in an Organized Health Care Education/Training Program

## 2022-08-18 DIAGNOSIS — Z9911 Dependence on respirator [ventilator] status: Secondary | ICD-10-CM

## 2022-08-18 DIAGNOSIS — J9611 Chronic respiratory failure with hypoxia: Secondary | ICD-10-CM

## 2022-08-18 DIAGNOSIS — J45909 Unspecified asthma, uncomplicated: Secondary | ICD-10-CM

## 2022-08-18 MED ORDER — NEBULIZER/TUBING/MOUTHPIECE KIT *A*
PACK | 0 refills | Status: DC
Start: 2022-08-18 — End: 2023-10-10

## 2022-08-18 NOTE — Telephone Encounter (Signed)
[name], nurse from [facility], called to inquire if patient can have his teeth cleaned before his extraction procedure.  Writer informed nurse, that there is no contraindication to having teeth cleaned prior to his procedure.  [name] verbalized understanding of instructions and will call back to clinic if there are any additional questions or concerns.

## 2022-08-18 NOTE — Telephone Encounter (Signed)
Liborio Nixon request script be faxed to Health system services.  Liborio Nixon gave fax # of [phone]

## 2022-08-24 ENCOUNTER — Encounter: Payer: Self-pay | Admitting: Gastroenterology

## 2022-08-24 ENCOUNTER — Encounter: Payer: Self-pay | Admitting: Student in an Organized Health Care Education/Training Program

## 2022-08-24 NOTE — Progress Notes (Signed)
Incoming paperwork for completion to the [facility]  Date Received: 08/24/2022    Type of paperwork (sender:form type): [employer]    Completed paperwork to be sent to:  Fax to [phone]    Special Requests:     Placed in box for: hardy

## 2022-08-25 NOTE — Progress Notes (Signed)
Outgoing paperwork:    Completed paperwork received on  08/25/2022  Sent to scanning? Yes  Sent as outlined in prior documentation: Fax to [phone]    Sherron Ales  9:27 AM  08/25/2022

## 2022-08-31 ENCOUNTER — Encounter: Payer: Self-pay | Admitting: Gastroenterology

## 2022-09-04 ENCOUNTER — Other Ambulatory Visit: Payer: Self-pay | Admitting: Oncology

## 2022-09-04 ENCOUNTER — Other Ambulatory Visit: Payer: Self-pay | Admitting: Primary Care

## 2022-09-05 ENCOUNTER — Other Ambulatory Visit: Payer: Self-pay | Admitting: Primary Care

## 2022-09-07 ENCOUNTER — Encounter: Payer: Self-pay | Admitting: Student in an Organized Health Care Education/Training Program

## 2022-09-07 ENCOUNTER — Encounter: Payer: Self-pay | Admitting: Gastroenterology

## 2022-09-07 NOTE — Progress Notes (Signed)
Incoming paperwork for completion to the [facility]  Date Received: 09/07/2022    Type of paperwork:   Sender/organization: [facility])  Form type: Speech Evaluation/Plan of Care Tracheostomy    Completed paperwork to be sent to:  Fax to [phone]    Placed in box for:  Debbra Riding  4:27 PM  09/07/2022

## 2022-09-13 ENCOUNTER — Ambulatory Visit
Payer: Medicare Other | Attending: Student in an Organized Health Care Education/Training Program | Admitting: Student in an Organized Health Care Education/Training Program

## 2022-09-13 ENCOUNTER — Ambulatory Visit: Payer: Medicare Other

## 2022-09-13 ENCOUNTER — Other Ambulatory Visit
Admission: RE | Admit: 2022-09-13 | Discharge: 2022-09-13 | Disposition: A | Discharge status: Home / Self Care | Payer: Medicare Other | Source: Ambulatory Visit

## 2022-09-13 ENCOUNTER — Other Ambulatory Visit: Payer: Self-pay

## 2022-09-13 VITALS — BP 130/74 | HR 91 | Wt 185.6 lb

## 2022-09-13 DIAGNOSIS — E119 Type 2 diabetes mellitus without complications: Secondary | ICD-10-CM | POA: Insufficient documentation

## 2022-09-13 LAB — BASIC METABOLIC PANEL
Anion Gap: 15 (ref 7–16)
CO2: 23 mmol/L (ref 20–28)
Calcium: 9.8 mg/dL (ref 9.0–10.3)
Chloride: 93 mmol/L — ABNORMAL LOW (ref 96–108)
Creatinine: 0.45 mg/dL — ABNORMAL LOW (ref 0.67–1.17)
Glucose: 122 mg/dL — ABNORMAL HIGH (ref 60–99)
Lab: 13 mg/dL (ref 6–20)
Potassium: 4.5 mmol/L (ref 3.3–5.1)
Sodium: 131 mmol/L — ABNORMAL LOW (ref 133–145)
eGFR BY CREAT: 145 *

## 2022-09-13 NOTE — Progress Notes (Signed)
[facility]    Visit Note     REASON FOR VISIT      Chief Complaint   Patient presents with    Other     Medication review       PRIMARY DIAGNOSIS      TBI    Subjective     SUBJECTIVE      DM2  - has had sugars mostly in low 100s  - last adjustment was made to Toujeo from 84 units nightly to 74 units nightly   - denies new symptoms  - most recent A1C 5.1 on 06/19/22  - nurse will send BG log, forgot to bring it today       Review of Systems as per HPI above    Past Medical History, Social History, Family History, and Medications/allergies reviewed during this visit    Current Outpatient Medications   Medication    guaifenesin (HUMIBID E) 400 mg TABS    chlorhexidine (PERIDEX) 0.12 % solution    nebulizer device with mask and tubing    Lactulose 20 gm/74mL solution    GLUCAGON EMERGENCY 1 MG injection kit    ipratropium (ATROVENT) 0.02 % nebulizer solution    bisacodyl (DULCOLAX) 10 mg suppository    sodium chloride 0.9 % nebulizer solution    metFORMIN (GLUCOPHAGE) 500 MG/5ML solution    azelastine (ASTELIN) 0.1 % nasal spray    Probiotic Product (PROBIOTIC GUMMIES) 30 MG CHEW    EPINEPHrine (EPIPEN JR) 0.15 mg/0.3 mL auto-injector    Acetaminophen 500 mg tablet    lacosamide (VIMPAT) 10 mg/mL SOLN oral solution    bisacodyl (DULCOLAX) 10 mg suppository    Insulin Glargine, 2 Unit Dial, (TOUJEO MAX SOLOSTAR) 300 UNIT/ML injection pen    insulin aspart (NOVOLOG FLEXPEN) 100 UNIT/ML injection pen    levETIRAcetam (KEPPRA) 100 mg/mL solution    Accu-Chek FastClix Lancets MISC    TOBI 300 MG/5ML nebulizer solution    famotidine (PEPCID) 20 mg tablet    B-D UF III MINI PEN NEEDLES 31G X 5 MM    blood glucose (ACCU-CHEK GUIDE) test strip    albuterol (PROVENTIL) (2.5 mg/47mL) 0.083% nebulizer solution    cetirizine (ZYRTEC) 10 mg tablet    EPINEPHrine (EPIPEN) 0.3 mg/0.3 mL auto-injector    calcium carbonate 250 mg/mL (100 mg/mL elemental calcium) suspension    sodium chloride (OCEAN) 0.65 % nasal  spray    clonazePAM (KLONOPIN) 1 mg tablet    Adhesive Tape (PAPER TAPE 1"X10YD) TAPE    fluticasone (FLONASE) 50 MCG/ACT nasal spray    SENNA 8.6 MG tablet    calcium carbonate 650 mg tablet    ibuprofen (ADVIL,MOTRIN) 600 mg tablet    carbamide peroxide (DEBROX) 6.5 % otic solution    nystatin (MYCOSTATIN) powder    generic DME    budesonide (PULMICORT) 0.25 mg/74mL nebulizer suspension    generic DME    generic DME    generic DME    generic DME    Gauze Pads & Dressings (SPLIT GAUZE DRAINAGE SPONGE) 4"X4"    generic DME    generic DME    generic DME    generic DME    generic DME    baclofen (LIORESAL) 10 mg tablet    valproate (VALPROIC ACID) 50 mg/mL syrup    generic DME    generic DME    generic DME    sodium chloride 0.9% 0.9 % irrigation    oxygen  Misc. Devices (PULSE OXIMETER) MISC    generic DME    Cholecalciferol 25 MCG (1000 UT) CHEW    MIC-KEY gastrostomy kit 14FR    lactobacillus rhamnosus, GG, (CULTURELLE) capsule    triamcinolone (KENALOG) 0.025 % cream    MIC-KEY gastrostomy feeding tube extension set 12"    MIC-KEY gastrostomy feeding tube extension set 24"    generic DME    diphenhydrAMINE (BENADRYL) 25 MG oral solid    Ascorbic Acid (VITAMIN C ADULT GUMMIES PO)    insulin pen needle (FIFTY50 PEN NEEDLES) 31G X 8 MM     No current facility-administered medications for this visit.          Objective     OBJECTIVE      BP 130/74   Pulse 91   Wt 84.2 kg (185 lb 9.6 oz)   SpO2 96%   BMI 30.89 kg/m?     Physical Exam  Constitutional:       General: He is not in acute distress.     Appearance: He is not ill-appearing.   HENT:      Nose: Nose normal. No congestion.      Mouth/Throat:      Mouth: Mucous membranes are moist.   Eyes:      Extraocular Movements: Extraocular movements intact.      Conjunctiva/sclera: Conjunctivae normal.   Cardiovascular:      Rate and Rhythm: Normal rate and regular rhythm.   Pulmonary:      Effort: Pulmonary effort is normal. No respiratory distress.   Musculoskeletal:          General: No swelling or deformity.   Skin:     General: Skin is warm and dry.      Capillary Refill: Capillary refill takes less than 2 seconds.   Neurological:      Mental Status: He is alert. Mental status is at baseline.                ASSESSMENT / DIAGNOSIS     1. Type 2 diabetes mellitus  - will obtain new A1c  - most likely can decrease long acting insulin if A1C is stable  - discussed goal would be to transition off insulin, but continue treatment with another oral medication  - Hemoglobin A1c; Future  - Basic metabolic panel; Future      Orders Placed This Encounter    Hemoglobin A1c    Basic metabolic panel       There are no Patient Instructions on file for this visit.       --Patient instructed to call if symptoms are not improving or worsening  --Follow-up arranged  Follow up in about 3 months (around 12/14/2022) for Physical.     I personally spent 35 minutes on the calendar day of the encounter, including pre and post visit work reviewing the EMR and management of this patient.     Electronically signed by Zollie Scale, DO on 09/28/2022 at 12:28 PM     [facility], Phone: [phone]

## 2022-09-13 NOTE — Progress Notes (Signed)
[facility] - Comprehensive Medication Management Note    Angel Wells is a 32 y.o. male who presents for co-visit with PCP and clinical pharmacist. Patient was referred by PCP for comprehensive medication management in the setting of uncontrolled DM . Patient's medications, preferred pharmacy and allergies were updated and marked as reviewed, as appropriate.                  There were no vitals filed for this visit.     Additional labs/vitals as documented in chart - reviewed all relevant objective info at time of visit.    Current Outpatient Medications   Medication Sig Dispense Refill    guaifenesin (HUMIBID E) 400 mg TABS take 1 tablet by mouth 2 times daily 60 tablet 5    chlorhexidine (PERIDEX) 0.12 % solution use as directed 473 mL 5    nebulizer device with mask and tubing Use as directed 1 kit 0    Lactulose 20 gm/44mL solution administer 30 mls (20 g total) via g tube daily as needed (no stool in 48 hours) use as directed 946 mL 5    GLUCAGON EMERGENCY 1 MG injection kit use as directed as needed for blood glucose < 70 1 each 0    ipratropium (ATROVENT) 0.02 % nebulizer solution INHALE 2.5 MLS BY NEBULIZATION 4 TIMES DAILY AS NEEDED FOR WHEEZING 62.5 mL 5    bisacodyl (DULCOLAX) 10 mg suppository Place 1 suppository (10 mg total) rectally every other day  AND PRN if pt has not had a BM in 2 days 12 suppository 3    sodium chloride 0.9 % nebulizer solution Inhale 5 mLs by nebulization as needed for Wheezing 15 mL 5    metFORMIN (GLUCOPHAGE) 500 MG/5ML solution Take 10 mLs (1,000 mg total) by mouth 2 times daily (with meals) 473 mL 5    azelastine (ASTELIN) 0.1 % nasal spray Spray 2 sprays into nostril 2 times daily as needed for Rhinitis  Use in each nostril as directed 30 mL 3    Probiotic Product (PROBIOTIC GUMMIES) 30 MG CHEW Take 30 mg by mouth daily      EPINEPHrine (EPIPEN JR) 0.15 mg/0.3 mL auto-injector Inject 0.3 mLs (0.15 mg total) into the muscle as needed for Anaphylaxis       Acetaminophen 500 mg tablet Administer 1 tablet (500 mg total) via G tube 3 times daily as needed for Pain or Fever      lacosamide (VIMPAT) 10 mg/mL SOLN oral solution Take 10 MILLILITER per tube twice daily. ; MAXIMUM DAILY DOSE 20 MILLILITER 600 mL 5    bisacodyl (DULCOLAX) 10 mg suppository please give suppository every other day, please check with dad prior to giving 12 each 5    Insulin Glargine, 2 Unit Dial, (TOUJEO MAX SOLOSTAR) 300 UNIT/ML injection pen Inject 74 units into the skin nightly  for Type 2 Diabetes Discard each pen 56 days after first use. (Patient taking differently: Inject 84 units into the skin nightly  for Type 2 Diabetes Discard each pen 56 days after first use.) 9 each 0    insulin aspart (NOVOLOG FLEXPEN) 100 UNIT/ML injection pen Inject 2-10 units into the skin 3 times daily (before meals)  <70 Hold Insulin, give 15g of carb/sugar to treat hypoglycemia.  70-180 No insulin.  181-260 Give 1 Units  261-340 Give 2 units 341-400 Give 3 Units  >400  Give 4 units 45 each 5    levETIRAcetam (KEPPRA) 100 mg/mL solution give  15 MILLILITER by g-tube twice daily 2700 mL 5    Accu-Chek FastClix Lancets MISC 1 EACH BY MISC.(NON-DRUG COMBO ROUTE) ROUTE 3 (THREE) TIMES DAILY. E11.65 102 each 5    TOBI 300 MG/5ML nebulizer solution Inhale 5 mLs (300 mg total) by nebulization every 12 hours  for pseudomonas infection, trach/vented patient NEBULIZE 1 VIAL (300 MG) TWICE DAILY EVERY OTHER MONTH   DIAGNOSIS: E84.0 280 mL 5    famotidine (PEPCID) 20 mg tablet take 1 tablet by mouth 2 (two) times daily. ok to crush it and put it down tube if necessary (Patient taking differently: Administer 1 tablet (20 mg total) via G tube 2 times daily  TAKE 1 TABLET BY MOUTH 2 (TWO) TIMES DAILY. OK TO CRUSH IT AND PUT IT DOWN TUBE IF NECESSARY) 180 tablet 5    B-D UF III MINI PEN NEEDLES 31G X 5 MM USE 3 (THREE) TIMES DAILY AS NEEDED. 100 each 5    blood glucose (ACCU-CHEK GUIDE) test strip USE 1 STRIP THREE TIMES DAILY.  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  When ill, use four times a day with vesting. 1050 mL 3    cetirizine (ZYRTEC) 10 mg tablet take 1 tablet (10 MILLIGRAM total) by mouth nightly (Patient taking differently: Administer 1 tablet (10 mg total) via G tube nightly) 90 tablet 5    EPINEPHrine (EPIPEN) 0.3 mg/0.3 mL auto-injector use as directed 2 each 5    calcium carbonate 250 mg/mL (100 mg/mL elemental calcium) suspension Administer 3 mLs (750 mg total) via G tube daily 500 mL 5    sodium chloride (OCEAN) 0.65 % nasal spray Spray 1 spray into each nostril 2 times daily as needed for Congestion 45 mL 5    clonazePAM (KLONOPIN) 1 mg tablet take 1 tablet by mouth twice daily as needed (crush for g-tube) for seizures; MAXIMUM DAILY DOSE 2 tabs 10 tablet 0    Adhesive Tape (PAPER TAPE 1"X10YD) TAPE Use as directed. Diagnosis code: G82.50 300 each 5    fluticasone (FLONASE) 50 MCG/ACT nasal spray spray 1 spray into nostril daily 16 g 5    SENNA 8.6 MG tablet administer 2 tablets via g tube daily 180 tablet 5    calcium carbonate 650 mg tablet take 1 tablet (650 MILLIGRAM total) by mouth daily via g tube 30 tablet 5    ibuprofen (ADVIL,MOTRIN) 600 mg tablet take 1 tablet by mouth 4 times daily as needed for pain (Patient taking differently: Administer 1 tablet (600 mg total) via G tube 4 times daily as needed for Pain or Fever) 30 tablet 11    carbamide peroxide (DEBROX) 6.5 % otic solution use 5 drops twice daily on the first 3 days of each month. 15 mL 5    nystatin (MYCOSTATIN) powder apply topically 4 times daily  to the following areas: neck, groin, axilla 60 g 0    generic DME Low rate alarms set at 4  breaths, high rate alarm set at 40 breaths, pressure support set at 13 cms 1 each 0    budesonide (PULMICORT) 0.25 mg/22mL nebulizer suspension INHALE 2 ML (CONTENT OF 1  VIAL) VIA NEBULIZER 2 TIMES DAILY FOR ASTHMA 360 mL 5    generic DME Ventilator for home use with humidifier 1 each 0    generic DME Item to dispense: Bivona silicone cuffed trach tube size 7 80 mm length  Instructions for use: Use as directed 2 each 5    generic DME Item to dispense: trach care kits  Instructions for use: use as directed 30 each 5    generic DME Item to dispense: trach ties  Instructions for use: use as directed 30 each 5    Gauze Pads & Dressings (SPLIT GAUZE DRAINAGE SPONGE) 4"X4" Use  as instructed. 30 each 5    generic DME Item to dispense: 2X2 gauze 30 each 5    generic DME Item to dispense: stationary pulse oximeter 1 each 0    generic DME Item to dispense: Purple Passey-Muir valve  Instructions for use: Use As Directed 1 each 5    generic DME Vent settings:  SIMV/PC/PS, RATE 10 IP 21. PS 13, PEEP 7, HIGH PRESSURE 50, LOW PRESSURE 5, HIGH RR 40, LOW RR14 1 each 0    generic DME .Cough Assist and related supplies. Use as directed 1 each 0    baclofen (LIORESAL) 10 mg tablet take 1 tablet (10 milligram total) by mouth 2 times daily (Patient taking differently: Administer 1 tablet (10 mg total) via G tube 2 times daily) 180 tablet 5    valproate (VALPROIC ACID) 50 mg/mL syrup take 5 mls (250 milligram total) by mouth 3 times daily 1350 mL 5    generic DME Item to dispense: 2 Tracheostomies #7 Bivona Silicone Cuffed 80 mm Length  Instructions for use: use as directed 2 each 6    generic DME Gel Overlay Mattress.  LON: 99, [id number]. Use as directed. G82.50, S06.9x9 1 each 0    generic DME Non-sterile gloves size medium Use as directed. MCAID ID [id number] 2boxes/month 3 each 5    sodium chloride 0.9% 0.9 % irrigation Irrigate with 250 mLs as directed 3 times daily 500 mL 0    oxygen 1-2 L/min to keep O2 sat above 92%, may increase up  to 4 lpm as needed.  Low saturation alarm 85%, high alarm off, low HR 50, High HR 200 1 each 0    Misc. Devices (PULSE OXIMETER) MISC Pulse Oximeter with Supplies, LON: 99, [id number], J96.11, Z99.11 1 each 0

## 2022-09-14 ENCOUNTER — Telehealth: Payer: Self-pay

## 2022-09-14 LAB — HEMOGLOBIN A1C: Hemoglobin A1C: 5.5 %

## 2022-09-14 NOTE — Telephone Encounter (Addendum)
Someone left voicemail on behalf of patient asking questions about appointment for next week.    Called patient's dad back (phone number listed in chart). Verified caller from earlier to be Tempie Donning, patient's home nurse. Verbal consent from patient's father obtained that our office can contact Anda Kraft concerning patient's care. Anda Kraft also a part of call at this time.    Anda Kraft Fleury's phone number [phone].    Questions regarding patient's upcoming procedure on 3/13 discussed. Kate/Steve asked for more information regarding necessity for patient's procedure. This Probation officer reiterated information directly from Gentry Fitz, DDS' note about extraction rationale. Kate/Steve verbalized understanding, but proceeded with questions concerning LA vs. sedation vs. GA possibilities. Writer explained that this would need to be a conversation with a provider and offered to take the rest of their questions and refer them to one of our [facility] providers. Anda Kraft Richardson Landry stated that this would work well.    Questions about upcoming procedure:  - Patient is on vent for 12 hours overnight, should they bring vent with them for procedure?  - How should his diabetes be managed pre and post Adreyan's procedure? Will he get insulin for procedure if needed?  - What will his post-op pain management plan be? Can he get sedation/GA for the procedure?  - Would like specific instructions in regards to patient's post-procedure diet, as he has a G-tube.    Writer followed up with Joaquin Courts, DDS about patient concerns, and based on extent of necessary conversation, she asked that we schedule patient for a video visit. Video visit booked for tomorrow (3/8) at 2pm, and Kate/Steve made aware. They stated that a video call with instructions via MyChart will work well. They both verbalized understanding of plan and thanked Probation officer for call. OMFS nursing line phone number confirmed with family and instructed them to call back for further questions  and/or issues with video visit.    Maggie, RN

## 2022-09-14 NOTE — Telephone Encounter (Signed)
Patient's nurse called with questions about patient's procedure.  Please follow up

## 2022-09-15 ENCOUNTER — Other Ambulatory Visit: Payer: Self-pay | Admitting: Oncology

## 2022-09-15 ENCOUNTER — Ambulatory Visit: Payer: Medicare Other

## 2022-09-18 ENCOUNTER — Telehealth: Payer: Self-pay

## 2022-09-18 NOTE — Telephone Encounter (Signed)
Patient's nurse Anda Kraft called stating patient dad & nurse will like to change appt procedure to sedation & not LA/Ext. She also stated she will like to have upcoming appt 3/13 changed to an in person consultation. Anda Kraft will like to speak with provider who's in charge of patiens procedure.  Anda Kraft [phone].

## 2022-09-19 ENCOUNTER — Telehealth: Payer: Self-pay

## 2022-09-20 ENCOUNTER — Other Ambulatory Visit: Payer: Self-pay

## 2022-09-20 ENCOUNTER — Ambulatory Visit: Payer: Medicare Other

## 2022-09-20 VITALS — BP 134/82 | HR 98

## 2022-09-20 DIAGNOSIS — K011 Impacted teeth: Secondary | ICD-10-CM

## 2022-09-20 NOTE — Progress Notes (Signed)
Outgoing paperwork:    Completed paperwork received on  09/20/2022  Sent to scanning? Yes  Sent as outlined in prior documentation: Fax to [phone]    Angel Wells  10:17 AM  09/20/2022

## 2022-09-20 NOTE — Patient Instructions (Signed)
ORAL AND MAXILLOFACIAL SURGERY  Pre-procedure Instructions for Local Anesthetics (numbing medicine)    You are scheduled to have a procedure with local anesthetics (numbing medicine) in the [facility].  To get to the clinic take the Silver elevators in the main lobby of the hospital to the 4th floor.  The clinic is the last waiting room at the end.  Please check in with the receptionist upon arrival.  When you check in you MUST have  • Not eaten or had anything to drink for 4 hours before your appointment.  (If you need to take medications you may do it up to 2 hours before your appointment with no more than ¼ cup of water only)   • If you are under the age of 18, you MUST have a legal parent or guardian with you on arrival.  (this is the person who makes legal decisions for you)  • Do not wear facial piercings (tongue, lip, cheek), nail polish or makeup.  If you do the procedure will be cancelled if it cannot be removed.  This is for your safety.    Local anesthetics (numbing medicine) are used to decrease any pain you might have at the site of the surgery.  You will be fully awake, just numb at the procedure site.    If you are late for your appointment (more than 15 minutes) it will be cancelled and rescheduled at the discretion of the provider.  The above instructions are for your safety and those of the providers in the procedure.  If you fail to follow these instructions the procedure will be cancelled.   Every effort will be made to ensure that your procedure will begin at the scheduled time.  However, there are occasions when a procedure may take longer than expected and your procedure may be delayed.  If you have any questions or concerns, please feel free to call the office between 9am -5pm.  [phone]

## 2022-09-21 ENCOUNTER — Ambulatory Visit: Payer: Medicare Other | Admitting: Optometrist

## 2022-09-22 ENCOUNTER — Telehealth: Payer: Self-pay | Admitting: Student in an Organized Health Care Education/Training Program
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
## 2022-09-22 NOTE — Telephone Encounter (Signed)
[facility] TELEPHONE INTAKE    Reason for call: [name] is calling to request a referral for OT for patient's hands  [name] would like to speak with nurse/PCP    Name of caller: [name]  Relationship to patient: RN    Phone:  [phone]
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
## 2022-10-04 ENCOUNTER — Other Ambulatory Visit: Payer: Self-pay | Admitting: Oncology

## 2022-10-04 ENCOUNTER — Telehealth: Payer: Self-pay | Admitting: Student in an Organized Health Care Education/Training Program

## 2022-10-04 DIAGNOSIS — J4540 Moderate persistent asthma, uncomplicated: Secondary | ICD-10-CM

## 2022-10-04 DIAGNOSIS — R062 Wheezing: Secondary | ICD-10-CM

## 2022-10-04 DIAGNOSIS — R569 Unspecified convulsions: Secondary | ICD-10-CM

## 2022-10-04 NOTE — Telephone Encounter (Deleted)
[facility] TELEPHONE TRIAGE     Reason for call: [name] relayed concern that "he is not doing very well right now". [name] requested to speak to a nurse regarding desaturation. [name] requests patient is scheduled for follow up tomorrow with an available provider. .     Name of caller: [name] for Angel Wells  Relationship to patient: PDN  Phone:  [phone]

## 2022-10-04 NOTE — Telephone Encounter (Signed)
[facility] TELEPHONE TRIAGE     Reason for call: Kate relayed concern that "he is not doing very well right now". Kate requested to speak to a nurse regarding desaturation. Kate requests patient is scheduled for follow up tomorrow with an available provider. .     Name of caller: Kate for Angel Wells  Relationship to patient: PDN  Phone:  [phone]

## 2022-10-04 NOTE — Telephone Encounter (Addendum)
Writer spoke with patients Father, Angel Wells.   Reviewed request for OT referral for patients hands.   Per Angel Wells, no acute issues; They would like to exercise and strengthen his hand proactively with the help of therapy.     Routing to PCP for review.     Gary Fleet, RN

## 2022-10-04 NOTE — Telephone Encounter (Signed)
Order faxed to [facility].     Gary Fleet, RN

## 2022-10-04 NOTE — Addendum Note (Signed)
Addended by: Gabriel Rung on: 10/04/2022 02:36 PM     Modules accepted: Orders

## 2022-10-04 NOTE — Telephone Encounter (Signed)
Writer spoke with Father, [name] and [name], PDN.   Per [name], patient had difficulty weaning down from the vent this morning.   She noted wheezing on auscultation and reports patient appeared "tight".   Administered scheduled duo neb which patient receives three times per day - per [name], patient also has a PRN order for Albuterol every six hours as needed.   Family has also administered saline nebs for comfort.   Last dose of Albuterol given at 10:30 AM.     Reports patient is currently doing well, on room air and off vent.   Tolerating fluids, however they have noticed decreased interest in eating.   Vesting three times per day, followed by cough assist, patient tolerating well.   [name] denies noting any changes to secretions.   Reports they appear slightly thicker, however pale in color.   Denies fever.   HR currently 80 bpm while resting.     [name] and Father, [name] requesting appointment for evaluation.   Writer offered Friday at 8:30 AM - Father requests video visit due to limited mobility for patient.   Writer booked w/ Gabriel Rung, NP.   Family inquiring on mobile x-ray prior to visit.     Writer reviewed to call 911 with concerns for difficulty breathing, despite using interventions, prolonged desaturations or concerns for clinical presentation.   Father and [name], PDN verbalized understanding and agreed with plan of care.     Routing to provider of the day for review/recommendations.     Gary Fleet, RN

## 2022-10-05 ENCOUNTER — Other Ambulatory Visit: Payer: Self-pay | Admitting: Gastroenterology

## 2022-10-05 ENCOUNTER — Telehealth: Payer: Self-pay

## 2022-10-05 NOTE — Telephone Encounter (Signed)
[name] called with a question regarding direct instillation of saline into Angel Wells's trach. Yesterday he had a mucus plug and she is asking if instillation of respiratory saline is appropriate. I use direct instillation often and find it very useful. We discussed instilling a couple cc's of saline, using the ambu bag to distribute the saline and then suction. Using judgement regarding the amount and quality of the sputum return in the catheter to gauge how many times to instill/suction. [name] verbalized understanding.

## 2022-10-05 NOTE — Telephone Encounter (Signed)
Writer spoke with [employer] - confirmed it has been completed. Will be faxed to [facility] once read by radiologist.     Writer notified front desk to provide copy to Gabriel Rung, NP or nursing once received in preparation for video visit tomorrow morning.     Gary Fleet, RN

## 2022-10-06 ENCOUNTER — Telehealth: Payer: Self-pay | Admitting: Student in an Organized Health Care Education/Training Program

## 2022-10-06 ENCOUNTER — Ambulatory Visit: Payer: Medicare Other | Admitting: Primary Care

## 2022-10-06 ENCOUNTER — Other Ambulatory Visit: Payer: Self-pay | Admitting: Student in an Organized Health Care Education/Training Program

## 2022-10-06 DIAGNOSIS — J069 Acute upper respiratory infection, unspecified: Secondary | ICD-10-CM

## 2022-10-06 DIAGNOSIS — J9600 Acute respiratory failure, unspecified whether with hypoxia or hypercapnia: Secondary | ICD-10-CM

## 2022-10-06 DIAGNOSIS — Z9911 Dependence on respirator [ventilator] status: Secondary | ICD-10-CM

## 2022-10-06 DIAGNOSIS — T17998A Other foreign object in respiratory tract, part unspecified causing other injury, initial encounter: Secondary | ICD-10-CM

## 2022-10-06 MED ORDER — COVID-19 AT HOME ANTIGEN TEST VI KIT
1.0000 | PACK | Freq: Once | 5 refills | Status: AC
Start: 2022-10-06 — End: 2022-10-06

## 2022-10-06 MED ORDER — SODIUM CHLORIDE 0.9 % IN NEBU *I*
5.0000 mL | INHALATION_SOLUTION | Freq: Three times a day (TID) | RESPIRATORY_TRACT | 0 refills | Status: DC
Start: 2022-10-06 — End: 2022-10-12

## 2022-10-06 MED ORDER — LEVOFLOXACIN 500 MG PO TABS *I*
500.0000 mg | ORAL_TABLET | Freq: Every day | ORAL | 0 refills | Status: AC
Start: 2022-10-06 — End: 2022-10-13

## 2022-10-06 MED ORDER — COVID-19 AT HOME ANTIGEN TEST VI KIT
1.0000 | PACK | Freq: Once | 5 refills | Status: DC
Start: 2022-10-06 — End: 2022-10-06

## 2022-10-06 NOTE — Telephone Encounter (Signed)
Writer spoke with Father, [name].   Notified that we were returning a phone call placed from [place].   Father provided the phone to [place], [place].   Writer reviewed instructions from PCP indicating to hold antibiotic therapy unless respiratory status changes (increased secretions, increased WOB, hypoxia).    [name] verbalized understanding and states she is required to note these instructions on the order.   She has updated on her end and will send a copy to [facility] for reference.   No other needs at this time.     Gary Fleet, RN

## 2022-10-06 NOTE — Progress Notes (Signed)
[facility]  Visit Note    UR Medicine  [facility]  [address]  [place], [place] [address]  P: [phone]  F: [phone]    PCP: Zollie Scale, DO     REASON FOR TODAY'S VISIT      Respiratory distress    SUBJECTIVE      Patient presents to clinic in via telemedicine accompanied by dad and Nurse [name] to discuss the following:    URI  Tuesday into Wednesday (3/26-3/27) he was very congested. Was difficult to get him off the vent, was dropping O2 sats to 70's, said he "needed air", and looked very distressed. Used vest and cough assist. Got extra saline nebulizer, albuterol, finally around 11am he was able to get off the vent.   Since this episode he has been at his baseline, able to come off vent without problem, on RA, no extra secretions, but nurse and dad concerned are very he could develop something over the weekend.  Does have hay fever currently and some sinusitis, red eyes, puffy eyes, sensitivity to light, crusted eyes and photophobia "don't open curtains". Has had nasal congestion over the past two days.  No fevers  Every other day BM. Appetite is great  Current pulmonary toileting regimen: Vest TID, Cough assist TID, Suctioned hourly sometimes more, Albuterol TID, Budesonide BID. Saline TID PRN, Saline nasal spray PRN  Not yet COVID swabbed, they ask for a kit  Not wheezing  Mild ronchi in upper lobes per nurse    Pertinent Review of Systems as per above.  Past Medical History, Social History, Family History, and Medications/allergies reviewed during this visit.    Current Outpatient Medications   Medication    valproate (VALPROIC ACID) 50 mg/mL syrup    guaifenesin (HUMIBID E) 400 mg TABS    chlorhexidine (PERIDEX) 0.12 % solution    nebulizer device with mask and tubing    Lactulose 20 gm/44mL solution    GLUCAGON EMERGENCY 1 MG injection kit    ipratropium (ATROVENT) 0.02 % nebulizer solution    bisacodyl (DULCOLAX) 10 mg suppository    sodium chloride 0.9 % nebulizer  solution    metFORMIN (GLUCOPHAGE) 500 MG/5ML solution    azelastine (ASTELIN) 0.1 % nasal spray    Probiotic Product (PROBIOTIC GUMMIES) 30 MG CHEW    EPINEPHrine (EPIPEN JR) 0.15 mg/0.3 mL auto-injector    Acetaminophen 500 mg tablet    lacosamide (VIMPAT) 10 mg/mL SOLN oral solution    bisacodyl (DULCOLAX) 10 mg suppository    Insulin Glargine, 2 Unit Dial, (TOUJEO MAX SOLOSTAR) 300 UNIT/ML injection pen    insulin aspart (NOVOLOG FLEXPEN) 100 UNIT/ML injection pen    levETIRAcetam (KEPPRA) 100 mg/mL solution    Accu-Chek FastClix Lancets MISC    TOBI 300 MG/5ML nebulizer solution    famotidine (PEPCID) 20 mg tablet    B-D UF III MINI PEN NEEDLES 31G X 5 MM    blood glucose (ACCU-CHEK GUIDE) test strip    albuterol (PROVENTIL) (2.5 mg/42mL) 0.083% nebulizer solution    cetirizine (ZYRTEC) 10 mg tablet    EPINEPHrine (EPIPEN) 0.3 mg/0.3 mL auto-injector    calcium carbonate 250 mg/mL (100 mg/mL elemental calcium) suspension    sodium chloride (OCEAN) 0.65 % nasal spray    clonazePAM (KLONOPIN) 1 mg tablet    Adhesive Tape (PAPER TAPE 1"X10YD) TAPE    fluticasone (FLONASE) 50 MCG/ACT nasal spray    SENNA 8.6 MG tablet  calcium carbonate 650 mg tablet    ibuprofen (ADVIL,MOTRIN) 600 mg tablet    carbamide peroxide (DEBROX) 6.5 % otic solution    nystatin (MYCOSTATIN) powder    generic DME    budesonide (PULMICORT) 0.25 mg/4mL nebulizer suspension    generic DME    generic DME    generic DME    generic DME    Gauze Pads & Dressings (SPLIT GAUZE DRAINAGE SPONGE) 4"X4"    generic DME    generic DME    generic DME    generic DME    generic DME    baclofen (LIORESAL) 10 mg tablet    generic DME    generic DME    generic DME    sodium chloride 0.9% 0.9 % irrigation    oxygen    Misc. Devices (PULSE OXIMETER) MISC    generic DME    Cholecalciferol 25 MCG (1000 UT) CHEW    MIC-KEY gastrostomy kit 14FR    lactobacillus rhamnosus, GG, (CULTURELLE) capsule    triamcinolone (KENALOG) 0.025 % cream    MIC-KEY gastrostomy  feeding tube extension set 12"    MIC-KEY gastrostomy feeding tube extension set 24"    generic DME    diphenhydrAMINE (BENADRYL) 25 MG oral solid    Ascorbic Acid (VITAMIN C ADULT GUMMIES PO)    insulin pen needle (FIFTY50 PEN NEEDLES) 31G X 8 MM     No current facility-administered medications for this visit.       Past Medical History:   Diagnosis Date    Asthma     Chronic respiratory failure     Diabetes     Seizure disorder     TBI (traumatic brain injury)     Ventilator dependence     nocturnal only      OBJECTIVE      There were no vitals taken for this visit.    Physical Exam  Constitutional:       General: He is not in acute distress.     Appearance: He is not ill-appearing, toxic-appearing or diaphoretic.   HENT:      Nose: No congestion or rhinorrhea.   Eyes:      General:         Right eye: Discharge present.         Left eye: Discharge present.  Pulmonary:      Effort: Pulmonary effort is normal.      Breath sounds: No wheezing.   Neurological:      Mental Status: He is alert. Mental status is at baseline.       ASSESSMENT / DIAGNOSIS     1. Mucus plug in respiratory tract  - Because desaturations and respiratory distress sounds to be isolated and resolved with aggressive pulmonary toileting, I suspect he may have had a mucus plug which has now cleared (patient is now back to respiratory baseline). I recommended that home nursing increase Saline Nebs to three times a day to thin secretions.  - Continue with aggressive pulmonary toileting (vesting, cough assist, albuterol, budesonide, saline)    2. Upper respiratory infection, acute  - New right basilar infiltrate seen on Mobile CXR obtained on 10/05/22 (new since prior CXR 5/23), possible that this is the very beginnings of CAP. Given that we are coming up on the weekend, family and nurse were agreeable to my sending in a script for Levaquin just in case respiratory status changes (increased secretions, increased WOB, hypoxia). Advised that abx NOT be  started if patient continues to do well and remains at current status.   - Will send over a home COVID kit to rule out COVID infection    Orders Placed This Encounter    COVID-19 At Home Antigen Test KIT    levoFLOXacin (LEVAQUIN) 500 mg tablet    sodium chloride 0.9 % nebulizer solution     Patient instructed to call if symptoms are not improving or worsening     I personally spent 42 minutes on the calendar day of the encounter, including pre and post visit work reviewing the EMR and management of this patient.     [name], NP  7:42 AM  10/06/22  [facility]  [address]  [place], [place] [address]  P: [phone]  F: [phone]

## 2022-10-06 NOTE — Telephone Encounter (Signed)
[facility] TELEPHONE TRIAGE     Reason for call: [name] requested to get clarification regarding patients Levaquin prescription. [name] states that is was her understanding patient was to take this medication if patient began to get sick. Per [name], paperwork from after visit summary states patient is to start Levaquin.     Name of caller: [name] for Trev Paco  Relationship to patient: PDN  Organization: [phone]

## 2022-10-09 ENCOUNTER — Encounter: Payer: Self-pay | Admitting: Student in an Organized Health Care Education/Training Program

## 2022-10-09 NOTE — Telephone Encounter (Signed)
A user error has taken place: encounter opened in error, closed for administrative reasons.

## 2022-10-11 ENCOUNTER — Telehealth: Payer: Self-pay | Admitting: Student in an Organized Health Care Education/Training Program

## 2022-10-11 NOTE — Telephone Encounter (Signed)
Angel Wells called and she would like an update on the occupational therapy referral doesn't look like any thing has been placed yet

## 2022-10-11 NOTE — Telephone Encounter (Signed)
Received fax from pharmacy stating directions and qty do not match for sodium chloride 0.9 % nebulizer solution. Please advise

## 2022-10-12 ENCOUNTER — Other Ambulatory Visit: Payer: Self-pay | Admitting: Student in an Organized Health Care Education/Training Program

## 2022-10-12 DIAGNOSIS — J9600 Acute respiratory failure, unspecified whether with hypoxia or hypercapnia: Secondary | ICD-10-CM

## 2022-10-12 DIAGNOSIS — Z9911 Dependence on respirator [ventilator] status: Secondary | ICD-10-CM

## 2022-10-12 NOTE — Telephone Encounter (Signed)
Writer spoke to [place] PDN. Requesting its documented that patients home Covid test on 3/30 was negative. No further patient needs at this time.

## 2022-10-16 MED ORDER — SODIUM CHLORIDE 0.9 % IN NEBU *I*
5.0000 mL | INHALATION_SOLUTION | Freq: Three times a day (TID) | RESPIRATORY_TRACT | 0 refills | Status: DC
Start: 2022-10-16 — End: 2024-02-01

## 2022-10-18 NOTE — Telephone Encounter (Signed)
Angel Wells called again says they still havent heard anything about a referral to OT but they would like it to be for [facility]. They would like a call back when put in.

## 2022-10-20 ENCOUNTER — Encounter: Payer: Self-pay | Admitting: Gastroenterology

## 2022-10-20 ENCOUNTER — Other Ambulatory Visit: Payer: Self-pay | Admitting: Student in an Organized Health Care Education/Training Program

## 2022-10-20 DIAGNOSIS — G8250 Quadriplegia, unspecified: Secondary | ICD-10-CM

## 2022-10-20 NOTE — Telephone Encounter (Signed)
Secure chat sent to PCP requesting order placement.     Eyana Stolze, RN

## 2022-10-23 NOTE — Telephone Encounter (Signed)
Writer spoke to [place], [place]. [name] requested to receive update regarding OT referral for patient.  [phone]

## 2022-10-25 ENCOUNTER — Encounter: Payer: Self-pay | Admitting: Student in an Organized Health Care Education/Training Program

## 2022-10-25 NOTE — Telephone Encounter (Signed)
Left generic VM with [facility] contact. If [name] calls back please advise referral has been faxed to [facility] on [address]

## 2022-10-25 NOTE — Telephone Encounter (Signed)
Secure chat sent to PCP requesting order placement.     Gary Fleet, RN

## 2022-10-25 NOTE — Progress Notes (Signed)
Incoming paperwork for completion to the [facility]  Date Received: 10/25/2022    Type of paperwork:   Sender/organization: [employer] [facility] [employer]  Form type: Detailed written order for equipment and supplies    Any specific/special request? no    Copy and scan before mailing? Yes    Completed paperwork to be sent to:  Fax to [phone]    Placed in box for:  Jacqlyn Krauss  10:31 AM  10/25/2022

## 2022-10-26 ENCOUNTER — Other Ambulatory Visit: Payer: Self-pay | Admitting: Pediatrics

## 2022-10-26 ENCOUNTER — Telehealth: Payer: Self-pay | Admitting: Student in an Organized Health Care Education/Training Program

## 2022-10-26 ENCOUNTER — Other Ambulatory Visit: Payer: Self-pay | Admitting: Oncology

## 2022-10-26 ENCOUNTER — Encounter: Payer: Self-pay | Admitting: Student in an Organized Health Care Education/Training Program

## 2022-10-26 NOTE — Telephone Encounter (Signed)
This patient attachment is clinically relevant.  Please keep in the patient's chart.    [] Document  [x] Photo    Brief attachment description: g-tube site  (Ex. L forearm rash, WC papers)    Thank you,  Ayyan Sites C Jerardo Costabile, RN

## 2022-10-26 NOTE — Progress Notes (Signed)
Incoming paperwork for completion to the [facility]  Date Received: 10/26/2022    Type of paperwork (sender:form type): [employer] plan of care    Completed paperwork to be sent to:  Fax to [phone]    Special Requests:     Placed in box for: Susette Racer

## 2022-10-26 NOTE — Telephone Encounter (Signed)
[facility] TELEPHONE TRIAGE     Reason for call: [name] requested urgent return call from nurse regarding patients skin around G-tube placement. [name] states patients skin is red an irritated. Per [name], there have been no improvements in condition after using prescribed ointments.     Name of caller: [name] for Angel Wells  Relationship to patient: PDN  Phone: [phone]

## 2022-10-26 NOTE — Telephone Encounter (Signed)
Last fuv 06/19/22

## 2022-10-26 NOTE — Telephone Encounter (Addendum)
Spoke to [place], [place].  Redness around g-tube site for past 10 days.  Using Porters liniment salve twice a day PRN.  Also tried a Nystatin cream.  Did not seem to help at all.  Encouraged to send MyChart of g-tube site to determine needs.    Dad aware of phone call and need.  Will update PCP.

## 2022-10-27 ENCOUNTER — Encounter: Payer: Self-pay | Admitting: Student in an Organized Health Care Education/Training Program

## 2022-10-27 ENCOUNTER — Telehealth: Payer: Self-pay | Admitting: Student in an Organized Health Care Education/Training Program

## 2022-10-27 NOTE — Telephone Encounter (Signed)
Continued communication in separate mychart encounter.  Will complete current encounter.

## 2022-10-27 NOTE — Telephone Encounter (Signed)
Erroneous Encounter. Please Disregard.

## 2022-10-27 NOTE — Progress Notes (Signed)
Outgoing paperwork:    Completed paperwork received on  10/27/2022  Sent to scanning? Yes  Sent as outlined in prior documentation: Faxed to [phone] and Original mailed to [address] [facility]. [place].    Lizbeth Bark  11:13 AM  10/27/2022

## 2022-10-27 NOTE — Telephone Encounter (Signed)
[facility] TELEPHONE TRIAGE     Reason for call: Wynona Canes states patients referral for PT has been denied due to lack of staff and resources.     Name of caller: Wynona Canes for [name]  Relationship to patient: Intake Nurse  Organization: [facility]  Phone: [phone]

## 2022-10-27 NOTE — Telephone Encounter (Signed)
[name] calling to report that patient's dad will be sending over new pictures today and would like to discuss them before the weekend.    [name] can be reached at [phone]

## 2022-10-29 ENCOUNTER — Encounter: Payer: Self-pay | Admitting: Student in an Organized Health Care Education/Training Program

## 2022-10-30 ENCOUNTER — Encounter: Payer: Self-pay | Admitting: Gastroenterology

## 2022-10-30 ENCOUNTER — Other Ambulatory Visit: Payer: Self-pay | Admitting: Oncology

## 2022-10-30 ENCOUNTER — Telehealth: Payer: Self-pay | Admitting: Student in an Organized Health Care Education/Training Program

## 2022-10-30 DIAGNOSIS — Z794 Long term (current) use of insulin: Secondary | ICD-10-CM

## 2022-10-30 NOTE — Telephone Encounter (Signed)
[facility] TELEPHONE INTAKE    Reason for call: [name] calling to request an apt for Friday morning for patient for is skin irritation and  also requesting an order to say leave it dry until patient is seen by provider.    Name of caller: [name]  Relationship to patient: nuse  Organization:   Phone:  [phone]  Fax:

## 2022-10-30 NOTE — Progress Notes (Signed)
Outgoing paperwork:    Completed paperwork received on  10/30/2022  Sent to scanning? Yes  Sent as outlined in prior documentation: Fax to [phone]    Sherron Ales  3:37 PM  10/30/2022

## 2022-10-31 ENCOUNTER — Encounter: Payer: Self-pay | Admitting: Student in an Organized Health Care Education/Training Program

## 2022-10-31 NOTE — Progress Notes (Signed)
Incoming paperwork for completion to the [facility]  Date Received: 10/31/2022    Type of paperwork:   Sender/organization: [employer]  Form type: Physician Order Addendum    Copy and scan before mailing? No    Completed paperwork to be sent to:  Fax to [phone]    Placed in box for:  Fredrich Romans  9:59 AM  10/31/2022

## 2022-10-31 NOTE — Telephone Encounter (Signed)
This has been addressed in a phone encounter for the same concern.

## 2022-10-31 NOTE — Telephone Encounter (Signed)
Spoke with pts father and advised of below message. Pts father will talk it over with [name] and give a call back

## 2022-10-31 NOTE — Telephone Encounter (Signed)
Writer spoke to [place]. [name] inquired if there is an update regarding patient being scheduled on Friday. Writer confirmed message had been sent to nursing staff regarding request for appointment.   [phone]

## 2022-10-31 NOTE — Telephone Encounter (Signed)
Angel Wells says that nursing staff have been inconsistent in care for skin around g tube. Says that Porters liniment salve worked in the past, but now seems to be ineffective. Over the weekend the area was kept Clean Dry and covered with a 2x2 split guaze dressing and showed improvement.     Angel Wells would like to continue this and update office on Thursday.     Will route to PCP as FYI.

## 2022-10-31 NOTE — Progress Notes (Signed)
Incoming paperwork for completion to the [facility]  Date Received: 10/31/2022    Type of paperwork:   Sender/organization: [facility]   Form type: Detailed Written Order    Completed paperwork to be sent to:  Fax to [phone]    Placed in box for:  Kyra Manges  3:00 PM  10/31/2022

## 2022-11-01 ENCOUNTER — Encounter: Payer: Self-pay | Admitting: Gastroenterology

## 2022-11-01 NOTE — Progress Notes (Signed)
Outgoing paperwork:    Completed paperwork received on  11/01/2022  Sent to scanning? Yes  Sent as outlined in prior documentation: Fax to [phone]     Sherron Ales  4:32 PM  11/01/2022

## 2022-11-01 NOTE — Progress Notes (Signed)
Outgoing paperwork:    Completed paperwork received on  11/01/2022  Sent to scanning? Yes  Sent as outlined in prior documentation: Fax to [phone]     Sherron Ales  4:22 PM  11/01/2022

## 2022-11-02 ENCOUNTER — Encounter: Payer: Self-pay | Admitting: Student in an Organized Health Care Education/Training Program

## 2022-11-02 NOTE — Telephone Encounter (Signed)
[facility] TELEPHONE TRIAGE     Reason for call: Phone connection was poor. [name] requests images attached are reviewed by nurse or PCP and inquired if patient could be scheduled for follow up tomorrow.     [name] states patients calcium prescription may need to be changed due to difficulties receiving medication from pharmacy who often does not have medication in stock.     [name] requested to discuss OT referral with nurse. [name] relayed she is having difficulty getting patient scheduled to be seen and inquired if patient should request services through another company.     Name of caller: [name] for Angel Wells  Relationship to patient: PDN  Phone:  [phone]

## 2022-11-03 NOTE — Telephone Encounter (Signed)
Angel Wells requesting orders for the weekend to keep the skin dry at the [place] [facility].    Also would like to talk about calcium formula   Angel Wells can be reached at [phone]

## 2022-11-03 NOTE — Telephone Encounter (Signed)
Spoke with Angel Wells she would like to continue keeping g tube site Clean Dry and covered with a 2x2 split guaze dressing since area continues showing improvement.     Says that calcium carbonate 250 mg/mL (100 mg/mL elemental calcium) suspension is not available at many pharmacies and would like to know if PCP would recommend other form. Says that pt can chew tums if PCP chooses this. Per recent swallow eval pt is able to chew foods.     Will route to PCP for review.

## 2022-11-06 ENCOUNTER — Encounter: Payer: Self-pay | Admitting: Student in an Organized Health Care Education/Training Program

## 2022-11-06 NOTE — Progress Notes (Signed)
Incoming paperwork for completion to the [facility]  Date Received: 11/06/2022    Type of paperwork:   Sender/organization: [employer]  Form type: Physician's Order - Mickey Site    Completed paperwork to be sent to:  Fax to [phone]    Placed in box for:  Kyra Manges  2:00 PM  11/06/2022

## 2022-11-08 ENCOUNTER — Encounter: Payer: Self-pay | Admitting: Student in an Organized Health Care Education/Training Program

## 2022-11-09 ENCOUNTER — Encounter: Payer: Self-pay | Admitting: Student in an Organized Health Care Education/Training Program

## 2022-11-09 ENCOUNTER — Telehealth: Payer: Self-pay | Admitting: Student in an Organized Health Care Education/Training Program

## 2022-11-09 NOTE — Telephone Encounter (Signed)
[facility] TELEPHONE INTAKE    Reason for call: [name] calling to request an apt. For next week to have patient's Mickey Tube site looked at.    Name of caller: [name]  Relationship to patient: Nurse  Organization:   Phone:  [phone]  Fax:

## 2022-11-10 ENCOUNTER — Telehealth: Payer: Self-pay | Admitting: Student in an Organized Health Care Education/Training Program

## 2022-11-10 NOTE — Telephone Encounter (Signed)
[name] called again about an appoint next week. For a mickey button and calcium

## 2022-11-10 NOTE — Telephone Encounter (Signed)
See additional encounters for follow up on these items.     Gary Fleet, RN

## 2022-11-10 NOTE — Telephone Encounter (Signed)
.  A user error has taken place: error

## 2022-11-10 NOTE — Telephone Encounter (Signed)
Angel Wells requesting a call back about making an apt.  Phone:[phone]

## 2022-11-10 NOTE — Telephone Encounter (Signed)
Writer contacted Father, [name].   Inquired if Father would like to reinstate privileges with PDN, [name].   Last discussion several months ago, Father requested all communication go only through him, and communication privileges were revoked with PDN RN.   Per Father, [facility] staff has approval to discuss return [name], RN phone calls as of 11/10/2022.  Father notified that he is able to withdrawal this care involvement at any time.   Verbalized understanding.     Writer contact [name], PDN RN.   Scheduled for in-person evaluation next week with PCP for follow up on g-tube redness and swelling.     Gary Fleet, RN

## 2022-11-13 ENCOUNTER — Encounter: Payer: Self-pay | Admitting: Student in an Organized Health Care Education/Training Program

## 2022-11-13 ENCOUNTER — Encounter: Payer: Self-pay | Admitting: Gastroenterology

## 2022-11-15 ENCOUNTER — Other Ambulatory Visit: Payer: Self-pay | Admitting: Oncology

## 2022-11-15 ENCOUNTER — Other Ambulatory Visit: Payer: Self-pay

## 2022-11-15 ENCOUNTER — Ambulatory Visit
Payer: Medicare Other | Attending: Student in an Organized Health Care Education/Training Program | Admitting: Student in an Organized Health Care Education/Training Program

## 2022-11-15 ENCOUNTER — Encounter: Payer: Self-pay | Admitting: Student in an Organized Health Care Education/Training Program

## 2022-11-15 VITALS — BP 98/60 | HR 82 | Temp 98.0°F | Wt 187.8 lb

## 2022-11-15 DIAGNOSIS — R21 Rash and other nonspecific skin eruption: Secondary | ICD-10-CM

## 2022-11-15 DIAGNOSIS — E119 Type 2 diabetes mellitus without complications: Secondary | ICD-10-CM | POA: Insufficient documentation

## 2022-11-15 MED ORDER — CALCIUM CARBONATE ANTACID 750 MG PO CHEW *I*
2.0000 | CHEWABLE_TABLET | Freq: Every day | ORAL | 2 refills | Status: DC
Start: 2022-11-15 — End: 2023-04-20

## 2022-11-15 MED ORDER — LACTOBACILLUS RHAMNOSUS (GG) PO CAPS *I*
2.0000 | ORAL_CAPSULE | Freq: Every day | ORAL | 5 refills | Status: AC
Start: 2022-11-15 — End: ?

## 2022-11-15 MED ORDER — NYSTATIN 100000 UNIT/GM EX OINT *I*
TOPICAL_OINTMENT | CUTANEOUS | 0 refills | Status: DC | PRN
Start: 2022-11-15 — End: 2022-12-11

## 2022-11-15 MED ORDER — CANAGLIFLOZIN 100 MG PO TABS *A*
100.0000 mg | ORAL_TABLET | Freq: Every day | ORAL | 1 refills | Status: DC
Start: 2022-11-15 — End: 2023-04-05

## 2022-11-15 MED ORDER — TOUJEO MAX SOLOSTAR 300 UNIT/ML SC SOPN
64.0000 [IU] | PEN_INJECTOR | Freq: Every evening | SUBCUTANEOUS | 0 refills | Status: DC
Start: 2022-11-15 — End: 2023-01-15

## 2022-11-15 MED ORDER — AZELASTINE HCL 137 MCG/SPRAY NA SOLN *I*
NASAL | 11 refills | Status: DC
Start: 2022-11-15 — End: 2023-12-06

## 2022-11-15 NOTE — Progress Notes (Signed)
[facility] Comprehensive Medication Management Note    Angel Wells is a 32 y.o. male who presents for co-visit with PCP and clinical pharmacist. Patient was referred by PCP for comprehensive medication management in the setting of  Diabetes . Patient's medications, preferred pharmacy and allergies were updated and marked as reviewed, as appropriate.                Vitals:    11/15/22 1316   BP: 98/60   Pulse: 82   Temp: 36.7 C (98 F)   Weight: 85.2 kg (187 lb 12.8 oz)        Additional labs/vitals as documented in chart - reviewed all relevant objective info at time of visit.    Current Outpatient Medications   Medication Sig Dispense Refill    B-D UF III MINI PEN NEEDLES 31G X 5 MM USE 3 (THREE) TIMES DAILY AS NEEDED. 100 each 5    azelastine (ASTELIN) 0.1 % nasal spray spray 2 sprays into nostril 2 times daily as needed for rhinitis use in each nostril as directed 30 mL 11    sodium chloride 0.9 % nebulizer solution Inhale 5 mLs by nebulization 3 times daily 17 mL 0    budesonide (PULMICORT) 0.25 mg/60mL nebulizer suspension INHALE 2 ML (CONTENT OF 1 VIAL) VIA NEBULIZER 2 TIMES DAILY FOR ASTHMA 360 mL 5    valproate (VALPROIC ACID) 50 mg/mL syrup take 5 MILLILITER by mouth 3 times daily 1350 mL 0    guaifenesin (HUMIBID E) 400 mg TABS take 1 tablet by mouth 2 times daily 60 tablet 5    chlorhexidine (PERIDEX) 0.12 % solution use as directed 473 mL 5    nebulizer device with mask and tubing Use as directed 1 kit 0    Lactulose 20 gm/24mL solution administer 30 mls (20 g total) via g tube daily as needed (no stool in 48 hours) use as directed 946 mL 5    GLUCAGON EMERGENCY 1 MG injection kit use as directed as needed for blood glucose < 70 1 each 0    ipratropium (ATROVENT) 0.02 % nebulizer solution INHALE 2.5 MLS BY NEBULIZATION 4 TIMES DAILY AS NEEDED FOR WHEEZING 62.5 mL 5    bisacodyl (DULCOLAX) 10 mg suppository Place 1 suppository (10 mg total) rectally every other day  AND PRN if pt has not  had a BM in 2 days 12 suppository 3    metFORMIN (GLUCOPHAGE) 500 MG/5ML solution Take 10 mLs (1,000 mg total) by mouth 2 times daily (with meals) 473 mL 5    Probiotic Product (PROBIOTIC GUMMIES) 30 MG CHEW Take 30 mg by mouth daily      EPINEPHrine (EPIPEN JR) 0.15 mg/0.3 mL auto-injector Inject 0.3 mLs (0.15 mg total) into the muscle as needed for Anaphylaxis.      Acetaminophen 500 mg tablet Administer 1 tablet (500 mg total) via G tube 3 times daily as needed for Pain or Fever.      lacosamide (VIMPAT) 10 mg/mL SOLN oral solution Take 10 MILLILITER per tube twice daily. ; MAXIMUM DAILY DOSE 20 MILLILITER 600 mL 5    bisacodyl (DULCOLAX) 10 mg suppository please give suppository every other day, please check with dad prior to giving 12 each 5    Insulin Glargine, 2 Unit Dial, (TOUJEO MAX SOLOSTAR) 300 UNIT/ML injection pen Inject 74 units into the skin nightly  for Type 2 Diabetes Discard each pen 56 days after first use. (Patient taking differently: Inject 84  units into the skin nightly for Type 2 Diabetes. Discard each pen 56 days after first use.) 9 each 0    insulin aspart (NOVOLOG FLEXPEN) 100 UNIT/ML injection pen Inject 2-10 units into the skin 3 times daily (before meals)  <70 Hold Insulin, give 15g of carb/sugar to treat hypoglycemia.  70-180 No insulin.  181-260 Give 1 Units  261-340 Give 2 units 341-400 Give 3 Units  >400  Give 4 units 45 each 5    levETIRAcetam (KEPPRA) 100 mg/mL solution give 15 MILLILITER by g-tube twice daily 2700 mL 5    Accu-Chek FastClix Lancets MISC 1 EACH BY MISC.(NON-DRUG COMBO ROUTE) ROUTE 3 (THREE) TIMES DAILY. E11.65 102 each 5    TOBI 300 MG/5ML nebulizer solution Inhale 5 mLs (300 mg total) by nebulization every 12 hours  for pseudomonas infection, trach/vented patient NEBULIZE 1 VIAL (300 MG) TWICE DAILY EVERY OTHER MONTH   DIAGNOSIS: E84.0 280 mL 5    famotidine (PEPCID) 20 mg tablet take 1 tablet by mouth 2 (two) times daily. ok to crush it and put it down tube if  necessary (Patient taking differently: Administer 1 tablet (20 mg total) via G tube 2 times daily. TAKE 1 TABLET BY MOUTH 2 (TWO) TIMES DAILY. OK TO CRUSH IT AND PUT IT DOWN TUBE IF NECESSARY) 180 tablet 5    blood glucose (ACCU-CHEK GUIDE) test strip USE 1 STRIP THREE TIMES DAILY. 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  When ill, use four times a day with vesting. 1050 mL 3    cetirizine (ZYRTEC) 10 mg tablet take 1 tablet (10 MILLIGRAM total) by mouth nightly (Patient taking differently: Administer 1 tablet (10 mg total) via G tube nightly.) 90 tablet 5    EPINEPHrine (EPIPEN) 0.3 mg/0.3 mL auto-injector use as directed 2 each 5    calcium carbonate 250 mg/mL (100 mg/mL elemental calcium) suspension Administer 3 mLs (750 mg total) via G tube daily 500 mL 5    sodium chloride (OCEAN) 0.65 % nasal spray Spray 1 spray into each nostril 2 times daily as needed for Congestion 45 mL 5    clonazePAM (KLONOPIN) 1 mg tablet take 1 tablet by mouth twice daily as needed (crush for g-tube) for seizures; MAXIMUM DAILY DOSE 2 tabs 10 tablet 0    Adhesive Tape (PAPER TAPE 1"X10YD) TAPE Use as directed. Diagnosis code: G82.50 300 each 5    fluticasone (FLONASE) 50 MCG/ACT nasal spray spray 1 spray into nostril daily 16 g 5    SENNA 8.6 MG tablet administer 2 tablets via g tube daily 180 tablet 5    calcium carbonate 650 mg tablet take 1 tablet (650 MILLIGRAM total) by mouth daily via g tube 30 tablet 5    ibuprofen (ADVIL,MOTRIN) 600 mg tablet take 1 tablet by mouth 4 times daily as needed for pain (Patient taking differently: Administer 1 tablet (600 mg total) via G tube 4 times daily as needed for Pain or Fever.) 30 tablet 11    carbamide peroxide (DEBROX) 6.5 % otic solution use 5 drops twice daily on the first 3 days of each month. 15 mL 5    nystatin (MYCOSTATIN) powder apply topically 4  times daily  to the following areas: neck, groin, axilla 60 g 0    generic DME Low rate alarms set at 4 breaths,  high rate alarm set at 40 breaths, pressure support set at 13 cms 1 each 0    generic DME Ventilator for home use with humidifier 1 each 0    generic DME Item to dispense: Bivona silicone cuffed trach tube size 7 80 mm length  Instructions for use: Use as directed 2 each 5    generic DME Item to dispense: trach care kits  Instructions for use: use as directed 30 each 5    generic DME Item to dispense: trach ties  Instructions for use: use as directed 30 each 5    Gauze Pads & Dressings (SPLIT GAUZE DRAINAGE SPONGE) 4"X4" Use  as instructed. 30 each 5    generic DME Item to dispense: 2X2 gauze 30 each 5    generic DME Item to dispense: stationary pulse oximeter 1 each 0    generic DME Item to dispense: Purple Passey-Muir valve  Instructions for use: Use As Directed 1 each 5    generic DME Vent settings:  SIMV/PC/PS, RATE 10 IP 21. PS 13, PEEP 7, HIGH PRESSURE 50, LOW PRESSURE 5, HIGH RR 40, LOW RR14 1 each 0    generic DME .Cough Assist and related supplies. Use as directed 1 each 0    baclofen (LIORESAL) 10 mg tablet take 1 tablet (10 milligram total) by mouth 2 times daily (Patient taking differently: Administer 1 tablet (10 mg total) via G tube 2 times daily.) 180 tablet 5    generic DME Item to dispense: 2 Tracheostomies #7 Bivona Silicone Cuffed 80 mm Length  Instructions for use: use as directed 2 each 6    generic DME Gel Overlay Mattress.  LON: 99, [id number]. Use as directed. G82.50, S06.9x9 1 each 0    generic DME Non-sterile gloves size medium Use as directed. MCAID ID [id number] 2boxes/month 3 each 5    sodium chloride 0.9% 0.9 % irrigation Irrigate with 250 mLs as directed 3 times daily 500 mL 0    oxygen 1-2 L/min to keep O2 sat above 92%, may increase up to 4 lpm as needed.  Low saturation alarm 85%, high alarm off, low HR 50, High HR 200 1 each 0    Misc. Devices (PULSE OXIMETER) MISC Pulse  Oximeter with Supplies, LON: 99, [id number], J96.11, Z99.11 1 each 0    generic DME [place] Percussion Vest Model # 105  55inch  Use as directed. 1 each 0    Cholecalciferol 25 MCG (1000 UT) CHEW Take 1,000 units by mouth daily  2 chews daily      MIC-KEY gastrostomy kit 14FR Please dispense every 3 months and prn, MicKey 14Fr 2.5cm 1 kit 3    lactobacillus rhamnosus, GG, (CULTURELLE) capsule Take 1 capsule (1 each total) by mouth daily 30 capsule 5    triamcinolone (KENALOG) 0.025 % cream Apply topically 2 times daily  APPLY TO AFFECTED AREA 15 g 2    MIC-KEY gastrostomy feeding tube extension set 12" Use as directed with gtube 1 kit 11    MIC-KEY gastrostomy feeding tube extension set 24" Use as directed, please dispense 4 per month 4 kit 5    generic DME PASSEY MUIR VALVE (PURPLE) Use as tolerated.MCAID ID [id number] 1 each 3    diphenhydrAMINE (BENADRYL) 25 MG oral solid Administer 1 each (25 mg total) via G tube nightly as needed for Sleep.      Ascorbic Acid (VITAMIN C ADULT GUMMIES PO) Take 250 mg by mouth daily  Give 2 gummies once  daily       insulin pen needle (FIFTY50 PEN NEEDLES) 31G X 8 MM 1 each       No current facility-administered medications for this visit.         Assessment and Plan:  With regards to medication management for this patient, the following aspects were reviewed: Indication, effectiveness, safety and convenience of each medication. The patient's medical conditions and medications were assessed, evaluated, and deemed meeting goals of drug therapy except as listed below:     1. Type 2 Diabetes  Current regimen: metformin 1000mg  twice daily + toujeo 74 units nightly  Fasting BG's 163mg /dL and lower (most often 161-$[id number]$$[id number]$ /dL range)  Pre-meal BG's are less than 180mg /dL, using the Novolog 2-3F a month)  Highest BG reported in month of April was 209mg /dL (pre-meal reading)  Last A1c=5.5%  Would like to continue down trending insulin needs and replace with oral agents   Start canagliflozin  100mg  daily, reevaluate for dosage increase in 1-3 months  Decrease Toujeo to 64 units nightly    Current Sliding Scale:  <70                  Hold Insulin, give 15g of carb/sugar to treat hypoglycemia  70-180             No insulin  181-260           Give 1 Units   261-340           Give 2 units  341-400Give 3 Units  >400                Give 4 units           2. Other Medication-Related Interventions: In addition to the medication changes noted above, these medication-related interventions were completed by the Pharmacist during today's visit:  disease state education + other    Plan  Recommendations were discussed with Provider (Dr. Susette Racer) during appointment. Plan for implementation of medication changes are documented in Provider's note.     Patient was able to verbalize understanding and repeat back key educational points.    Follow-up:  Next PCP visit      Patient Status: Open to [facility]     Duration of Visit: 16-30 min    Thank you for allowing me to participate in the care of this patient. Please contact me with any questions.     Abram Sander, PharmD., BCCCP  Clinical Pharmacist Practitioner, CPP  [facility]  [address] [place], [place] [address]  [phone]

## 2022-11-15 NOTE — Progress Notes (Signed)
[facility]    Visit Note     REASON FOR VISIT      Chief Complaint   Patient presents with    Follow-up         PRIMARY DIAGNOSIS      TBI    Subjective     SUBJECTIVE      DM2  - taking metformin and toujeo 74 units nightly  - BG range between 120 and low 200s  - denies episodes of hypoglycemia  - most recent A1C 5.5, increased from prior   - denies significant change in diet     Gtube  - erythema around tube site waxes and wanes depending on consistent with care  - denies fever, purulent discharge, bleeding  - when using gentle soap to clean site followed by keeping dry bandage, it does better  - having some leakage around tube, but does not soak through gauze           Review of Systems as per HPI above    Past Medical History, Social History, Family History, and Medications/allergies reviewed during this visit    Current Outpatient Medications   Medication    B-D UF III MINI PEN NEEDLES 31G X 5 MM    sodium chloride 0.9 % nebulizer solution    budesonide (PULMICORT) 0.25 mg/42mL nebulizer suspension    guaifenesin (HUMIBID E) 400 mg TABS    chlorhexidine (PERIDEX) 0.12 % solution    nebulizer device with mask and tubing    Lactulose 20 gm/70mL solution    GLUCAGON EMERGENCY 1 MG injection kit    ipratropium (ATROVENT) 0.02 % nebulizer solution    metFORMIN (GLUCOPHAGE) 500 MG/5ML solution    Probiotic Product (PROBIOTIC GUMMIES) 30 MG CHEW    EPINEPHrine (EPIPEN JR) 0.15 mg/0.3 mL auto-injector    Acetaminophen 500 mg tablet    bisacodyl (DULCOLAX) 10 mg suppository    insulin aspart (NOVOLOG FLEXPEN) 100 UNIT/ML injection pen    levETIRAcetam (KEPPRA) 100 mg/mL solution    Accu-Chek FastClix Lancets MISC    TOBI 300 MG/5ML nebulizer solution    famotidine (PEPCID) 20 mg tablet    albuterol (PROVENTIL) (2.5 mg/72mL) 0.083% nebulizer solution    cetirizine (ZYRTEC) 10 mg tablet    EPINEPHrine (EPIPEN) 0.3 mg/0.3 mL auto-injector    sodium chloride (OCEAN) 0.65 % nasal spray    clonazePAM  (KLONOPIN) 1 mg tablet    Adhesive Tape (PAPER TAPE 1"X10YD) TAPE    fluticasone (FLONASE) 50 MCG/ACT nasal spray    SENNA 8.6 MG tablet    ibuprofen (ADVIL,MOTRIN) 600 mg tablet    carbamide peroxide (DEBROX) 6.5 % otic solution    nystatin (MYCOSTATIN) powder    generic DME    generic DME    generic DME    generic DME    generic DME    Gauze Pads & Dressings (SPLIT GAUZE DRAINAGE SPONGE) 4"X4"    generic DME    generic DME    generic DME    generic DME    generic DME    generic DME    generic DME    generic DME    sodium chloride 0.9% 0.9 % irrigation    oxygen    Misc. Devices (PULSE OXIMETER) MISC    generic DME    Cholecalciferol 25 MCG (1000 UT) CHEW    MIC-KEY gastrostomy kit 14FR    triamcinolone (KENALOG) 0.025 % cream    MIC-KEY gastrostomy feeding tube extension  set 12"    MIC-KEY gastrostomy feeding tube extension set 24"    generic DME    diphenhydrAMINE (BENADRYL) 25 MG oral solid    Ascorbic Acid (VITAMIN C ADULT GUMMIES PO)    insulin pen needle (FIFTY50 PEN NEEDLES) 31G X 8 MM    blood glucose (ACCU-CHEK GUIDE) test strip    nystatin (MYCOSTATIN) ointment    lacosamide (VIMPAT) 10 mg/mL SOLN oral solution    valproate (VALPROIC ACID) 50 mg/mL syrup    baclofen (LIORESAL) 10 mg tablet    Docusate Sodium (SILACE) 150 MG/15ML syrup    bisacodyl (DULCOLAX) 10 mg suppository    canagliflozin (INVOKANA) 100 mg tablet    azelastine (ASTELIN) 0.1 % nasal spray    calcium carbonate (TUMS EX) 750 MG chewable tablet    Insulin Glargine, 2 Unit Dial, (TOUJEO MAX SOLOSTAR) 300 UNIT/ML injection pen    lactobacillus rhamnosus, GG, (CULTURELLE) capsule     No current facility-administered medications for this visit.          Objective     OBJECTIVE      BP 98/60   Pulse 82   Temp 36.7 C (98 F) (Temporal)   Wt 85.2 kg (187 lb 12.8 oz)   SpO2 96%   BMI 31.25 kg/m     Physical Exam  Constitutional:       General: He is not in acute distress.     Appearance: He is not ill-appearing.   HENT:      Nose: No  congestion.      Mouth/Throat:      Mouth: Mucous membranes are moist.   Eyes:      General: No scleral icterus.     Conjunctiva/sclera: Conjunctivae normal.   Cardiovascular:      Rate and Rhythm: Normal rate and regular rhythm.   Pulmonary:      Effort: Pulmonary effort is normal. No respiratory distress.   Abdominal:      General: There is no distension.      Palpations: Abdomen is soft.      Tenderness: There is no abdominal tenderness.   Skin:     General: Skin is warm and dry.      Capillary Refill: Capillary refill takes less than 2 seconds.      Findings: Erythema (surrounding gtube site without purulence or satellite lesions) present.   Neurological:      Mental Status: He is alert. Mental status is at baseline.                ASSESSMENT / DIAGNOSIS     1. Type 2 diabetes mellitus  - will continue moving away from insulin and increase oral regimen  - decrease toujeo to 64 units nightly  - start canagliflozin 100 mg daily   - Hemoglobin A1c; Future    2. Rash and other nonspecific skin eruption  - less likely fungal rash without satellite lesions and worsening erythema  - leakage around tube is not significant enough to suggest imminent need for tube size increase  - no fever or purulence to suggest infection, no antibiotics at this time  - continue gentle cleaning follows by dry gauze      Orders Placed This Encounter    Hemoglobin A1c    canagliflozin (INVOKANA) 100 mg tablet    azelastine (ASTELIN) 0.1 % nasal spray    calcium carbonate (TUMS EX) 750 MG chewable tablet    Insulin Glargine, 2 Unit Dial, (TOUJEO MAX SOLOSTAR)  300 UNIT/ML injection pen    lactobacillus rhamnosus, GG, (CULTURELLE) capsule       There are no Patient Instructions on file for this visit.       --Patient instructed to call if symptoms are not improving or worsening  --Follow-up arranged  Follow up in about 3 months (around 02/15/2023) for Follow up, Diabetes.     I personally spent 30 minutes on the calendar day of the encounter,  including pre and post visit work reviewing the EMR and management of this patient.     Electronically signed by Zollie Scale, DO    [facility], Phone: [phone]

## 2022-11-16 ENCOUNTER — Encounter: Payer: Self-pay | Admitting: Student in an Organized Health Care Education/Training Program

## 2022-11-16 ENCOUNTER — Other Ambulatory Visit: Payer: Self-pay | Admitting: Oncology

## 2022-11-16 DIAGNOSIS — R569 Unspecified convulsions: Secondary | ICD-10-CM

## 2022-11-16 DIAGNOSIS — M2450 Contracture, unspecified joint: Secondary | ICD-10-CM

## 2022-11-16 NOTE — Progress Notes (Signed)
Received the below message regarding PA:    We denied this request under Medicare Part D because:    TOBRAMYCIN Nebu Soln is used in a nebulizer. A nebulizer is a piece of durable medical equipment (DME). Drugs used with DME in the home are covered under Medicare Part B. Our records show that you do not live in a long term care (LTC) facility. We cannot pay for drugs under Medicare Part D if they are covered under   Medicare Part A or B. We did not decide whether TOBRAMYCIN Nebu Soln is medically necessary. We made our decision only on the fact that we cannot pay for the drug under Medicare Part D

## 2022-11-16 NOTE — Progress Notes (Signed)
Outgoing paperwork:    Completed paperwork received on  11/16/2022  Sent to scanning? Yes  Sent as outlined in prior documentation: Fax to [phone]     Sherron Ales  1:56 PM  11/16/2022

## 2022-11-16 NOTE — Telephone Encounter (Signed)
This patient attachment is clinically relevant.  Please keep in the patient's chart.    []  Document  [x]  Photo    Brief attachment description: drainage   (Ex. L forearm rash, WC papers)    Thank you,  Dorian Pod, RN

## 2022-11-16 NOTE — Progress Notes (Signed)
[facility]  Prior Authorization Request    Date of request: 11/16/2022  Time of call: 9:37 AM    Medication Requiring PA: TOBI 300 MG/5ML nebulizer solution     Directions(dosing and frequency): Inhale 5 mLs (300 mg total) by nebulization every 12 hours for pseudomonas infection, trach/vented patient NEBULIZE 1 VIAL (300 MG) TWICE DAILY EVERY OTHER MONTH     Quantity/ Day Supply: 280/28    Dx code: E84.0    Request received from:[facility] PA Source: Pharmacy    Name and Phone Number of Pharmacy:    Additional Pharmacy Benefit plan: no    Secondary insurance: Medicaid    Please run secondary if not covered under primary: yes       Insurance coverage confirmed: yes    Covered Alternative Medication:     Tax ID: [id number]    Provider and NPI: Susette Racer - [id number]    Additional Justification:n/a    Key ID: [id number]    Please provide justification for PA submission & urgency or indicate which covered alternative was ordered

## 2022-11-17 ENCOUNTER — Encounter: Payer: Self-pay | Admitting: Student in an Organized Health Care Education/Training Program

## 2022-11-17 ENCOUNTER — Telehealth: Payer: Self-pay | Admitting: Student in an Organized Health Care Education/Training Program

## 2022-11-17 NOTE — Telephone Encounter (Addendum)
[facility] TELEPHONE TRIAGE     Reason for call: [name] relayed patient "is leaning to the right a lot". [name] inquired if patients neck brace could be worn as a PRN. [name] requested to receive return call as a Engineer, civil (consulting).    Name of caller: [name] for Angel Wells  Relationship to patient: PDN  Phone: [phone]

## 2022-11-20 ENCOUNTER — Other Ambulatory Visit: Payer: Self-pay | Admitting: Primary Care

## 2022-11-20 DIAGNOSIS — M2450 Contracture, unspecified joint: Secondary | ICD-10-CM

## 2022-11-22 ENCOUNTER — Encounter: Payer: Self-pay | Admitting: Student in an Organized Health Care Education/Training Program

## 2022-11-22 NOTE — Telephone Encounter (Addendum)
[name] called again and asked for an update

## 2022-11-22 NOTE — Addendum Note (Signed)
Addended by: Gerrie Nordmann on: 11/22/2022 11:20 AM     Modules accepted: Orders

## 2022-11-24 ENCOUNTER — Telehealth: Payer: Self-pay

## 2022-11-24 NOTE — Telephone Encounter (Signed)
Writer spoke to [employer]. She claims that per Dr Shon Baton & Lynnell Dike, can be scheduled for Sed vs. LA.  Her contact # is [phone].  Please advise  Thank you

## 2022-11-24 NOTE — Telephone Encounter (Signed)
Writer spoke to Angel Wells. Angel Wells requesting to confirm message regarding neck collar was received. Angel Wells requesting to receive reply regarding neck collar today.   [phone]

## 2022-11-24 NOTE — Telephone Encounter (Signed)
[name] is calling to get status on neck brace  [name] would like to speak with nurse/PCP    Phone: [phone]

## 2022-11-24 NOTE — Telephone Encounter (Signed)
Says that order at last visit was ended. Plan was to see how pt did without the neck brace. Says that after activities pt becomes really tired and begins to lean to the right more. Would like a PRN order that would allow pt to use neck brace while up in WC. Writer gave verbal approval for this. Pt has a fuv on 6/21 and [name] would like to discuss this more at that time.     Will route to PCP as FYI.

## 2022-11-27 ENCOUNTER — Telehealth: Payer: Self-pay | Admitting: Student in an Organized Health Care Education/Training Program

## 2022-11-27 DIAGNOSIS — R093 Abnormal sputum: Secondary | ICD-10-CM

## 2022-11-27 NOTE — Telephone Encounter (Signed)
Increased to 4x a day for 2 days  Increase nebs to 4x a day for 2 days   Increase mucinex to 4 times a day for 2 days

## 2022-11-28 ENCOUNTER — Telehealth: Payer: Self-pay | Admitting: Student in an Organized Health Care Education/Training Program

## 2022-11-28 NOTE — Telephone Encounter (Signed)
Called and spoke with Darel Hong at [facility]  Requesting order and face sheet be faxed to them at [phone]  No further needs  Provider aware

## 2022-11-28 NOTE — Telephone Encounter (Signed)
[facility] TELEPHONE TRIAGE     Reason for call: Lyla Son requested to speak to a nurse regarding [facility] order request. Lyla Son relayed she does not have enough information to schedule imaging.     Name of caller: Lyla Son for Angel Wells  Relationship to patient: RN  Organization: [facility]   Phone:  [phone]

## 2022-12-01 ENCOUNTER — Telehealth: Payer: Self-pay | Admitting: Student in an Organized Health Care Education/Training Program

## 2022-12-01 ENCOUNTER — Encounter: Payer: Self-pay | Admitting: Student in an Organized Health Care Education/Training Program

## 2022-12-01 NOTE — Progress Notes (Signed)
Incoming paperwork for completion to the [facility]  Date Received: 12/01/2022    Type of paperwork:   Sender/organization: [employer]  Form type: Physician Order    Completed paperwork to be sent to:  Fax to [phone]    Placed in box for:  Kyra Manges  10:17 AM  12/01/2022

## 2022-12-01 NOTE — Telephone Encounter (Signed)
[facility] TELEPHONE INTAKE    Reason for call: [name] requesting an Physician Order for patient to take 5000 units of Vitamin D because the parents bought the 5000 instead of the 2000.     Name of caller: [name]  Relationship to patient: Nurse  Organization:   Phone:  [phone]  Fax:

## 2022-12-01 NOTE — Telephone Encounter (Signed)
[facility] TELEPHONE TRIAGE     Reason for call: [name] states patients father is currently in the hospital. [name] inquired if she could receive and review recent x-ray imaging results if available.     Name of caller: [name] for Serigo Coutts  Relationship to patient: PDN  Phone: [phone]

## 2022-12-06 ENCOUNTER — Telehealth: Payer: Self-pay

## 2022-12-06 ENCOUNTER — Encounter: Payer: Self-pay | Admitting: Gastroenterology

## 2022-12-06 NOTE — Telephone Encounter (Signed)
Patient's Nurse/Care taker Florentina Addison reached out stating pt's provider spoke with Lynnell Dike stating appt procedure was agreed to be changed to a sedation procedure. Writer spoke with RW & per RW he will revise pt chart & someone will reach nurse with next steps.    Katie's contact # is [phone].

## 2022-12-06 NOTE — Telephone Encounter (Signed)
Writer spoke to [place], [place]. [name] requested to receive update regarding changes to patients vitamin D dosage. [name] states patiens G-tube site is doing well so [name] would like to extend orders for nystatin to next appointment, 6/21. [name] would also like to extend order for mucinex for patients secretions to 6/21.    [phone]

## 2022-12-07 ENCOUNTER — Other Ambulatory Visit: Payer: Self-pay | Admitting: Neurology

## 2022-12-07 DIAGNOSIS — G40219 Localization-related (focal) (partial) symptomatic epilepsy and epileptic syndromes with complex partial seizures, intractable, without status epilepticus: Secondary | ICD-10-CM

## 2022-12-07 MED ORDER — LACOSAMIDE 10 MG/ML PO SOLN *I*
ORAL | 5 refills | Status: DC
Start: 2022-12-07 — End: 2023-06-21

## 2022-12-07 NOTE — Telephone Encounter (Signed)
Angel Wells called again and stated she did not receive a return call  Angel Wells would like to speak with nurse/PCP  Please advise    Phone: [phone]

## 2022-12-07 NOTE — Telephone Encounter (Signed)
Please call Mr. Trick parents to schedule his now overdue yearly follow-up.  I last saw him on 11/09/2021.  There is no documentation that anyone has attempted to reach them yet.  Thank you.

## 2022-12-07 NOTE — Telephone Encounter (Signed)
Confidential Drug Report  Search Terms: Micai Anuszewski, 1991/01/13  Search Date: 12/07/2022 11:58:32 AM  Searching on behalf of: [id number] - Newman Nip  The Drug Utilization Report below displays all of the controlled substance prescriptions, if any, that your patient has filled in the last twelve months. The information displayed on this report is compiled from pharmacy submissions to the Department, and accurately reflects the information as submitted by the pharmacies.  This report was requested by: Myer Haff  Reference #: [id number]  Practitioner Count: 1  Pharmacy Count: 1  Current Opioid Prescriptions: 0  Current Benzodiazepine Prescriptions: 0  Current Stimulant Prescriptions: 0        Patient Demographic Information [facility])       PDI First Name Last Name Birth Date Gender Street Address [place] Zip Code   A Saam Sadoski 24-Sep-1990 Male [address] [place] [address]        Search:  [facility] My Rx Current Rx Drug Type Rx Written Rx Dispensed Drug Quantity Days Supply Prescriber Name Prescriber DEA # Payment Method Dispenser   A N Y  06/16/2022 11/17/2022 lacosamide 10 mg/ml solution 600 30 Azucena Kuba (MD) [id number] Insurance [facility]   A N N  06/16/2022 10/20/2022 lacosamide 10 mg/ml solution 600 30 Azucena Kuba (MD) [id number] Insurance [facility]   A N N  06/16/2022 09/18/2022 lacosamide 10 mg/ml solution 600 30 Azucena Kuba (MD) [id number] Insurance [facility]   A N N  06/16/2022 08/23/2022 lacosamide 10 mg/ml solution 600 30 Azucena Kuba (MD) [id number] Insurance [facility]   A N N  06/16/2022 07/28/2022 lacosamide 10 mg/ml solution 600 30 Azucena Kuba (MD) [id number] Insurance [facility]   A N N  06/16/2022 06/27/2022 lacosamide 10 mg/ml solution 600 30 Azucena Kuba (MD) [id number] Insurance [facility]   A N N  01/12/2022 05/30/2022 lacosamide 10 mg/ml solution 600 30 Azucena Kuba (MD) [id number] Insurance [facility]   A  N N  01/12/2022 05/05/2022 lacosamide 10 mg/ml solution 600 30 Azucena Kuba (MD) [id number] Insurance [facility]   A N N B 03/17/2022 03/20/2022 clonazepam 1 mg tablet 10 5 [name] [id number] Insurance [facility]   A N N  01/12/2022 03/09/2022 lacosamide 10 mg/ml solution 600 30 Azucena Kuba (MD) [id number] Insurance [facility]   A N N  01/12/2022 02/10/2022 lacosamide 10 mg/ml solution 600 30 Azucena Kuba (MD) [id number] Insurance [facility]   A N N  01/12/2022 01/13/2022 lacosamide 10 mg/ml solution 600 30 Azucena Kuba (MD) [id number] Insurance [facility]     Showing 1 to 12 of 12 entries     * - Details of Drug Type : O = Opioid, B = Benzodiazepine, S = Stimulant  * - Drugs marked with an asterisk are compound drugs. If the compound drug is made up of more than one controlled substance, then each controlled substance will be a separate row in the table.   2024 [facility] [place]

## 2022-12-08 ENCOUNTER — Other Ambulatory Visit: Payer: Self-pay | Admitting: Oncology

## 2022-12-08 DIAGNOSIS — E119 Type 2 diabetes mellitus without complications: Secondary | ICD-10-CM

## 2022-12-08 DIAGNOSIS — E1165 Type 2 diabetes mellitus with hyperglycemia: Secondary | ICD-10-CM

## 2022-12-08 DIAGNOSIS — Z794 Long term (current) use of insulin: Secondary | ICD-10-CM

## 2022-12-10 ENCOUNTER — Other Ambulatory Visit: Payer: Self-pay | Admitting: Student in an Organized Health Care Education/Training Program

## 2022-12-12 ENCOUNTER — Encounter: Payer: Self-pay | Admitting: Dental General Practice

## 2022-12-12 NOTE — Progress Notes (Signed)
Angel Wells is a 60 yom with TBI, chronic respiratory failure, wheelchair dependent ventilator dependent with cognitive impairment and uncooperative/difficult cooperation in office setting.      After reviewing documentation regarding patient health and cooperation, the patient is felt to be better served in operating room for dental extractions for patient safety and due to combativeness and inability to tolerate exam, radiographs in office setting. Patient had never had extractions in office and combative with radiographs. Per notes and reports patient expresses pain in lower jaw, potentially associated with impacted teeth.     Plan for third molar extractions in operating room setting 04/04/23

## 2022-12-14 ENCOUNTER — Ambulatory Visit: Payer: Medicare Other | Attending: Student in an Organized Health Care Education/Training Program

## 2022-12-14 ENCOUNTER — Other Ambulatory Visit: Payer: Self-pay

## 2022-12-14 DIAGNOSIS — G8250 Quadriplegia, unspecified: Secondary | ICD-10-CM | POA: Insufficient documentation

## 2022-12-14 DIAGNOSIS — Z9911 Dependence on respirator [ventilator] status: Secondary | ICD-10-CM | POA: Insufficient documentation

## 2022-12-14 DIAGNOSIS — S069XAA Unspecified intracranial injury with loss of consciousness status unknown, initial encounter: Secondary | ICD-10-CM

## 2022-12-14 DIAGNOSIS — E119 Type 2 diabetes mellitus without complications: Secondary | ICD-10-CM

## 2022-12-14 DIAGNOSIS — Z87820 Personal history of traumatic brain injury: Secondary | ICD-10-CM | POA: Insufficient documentation

## 2022-12-14 NOTE — Progress Notes (Signed)
Outgoing paperwork:    Completed paperwork received on  12/14/2022  Sent as outlined in prior documentation: Fax to [phone]    [name]  10:49 AM  12/14/2022
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
## 2022-12-16 ENCOUNTER — Telehealth: Payer: Self-pay

## 2022-12-16 NOTE — Telephone Encounter (Signed)
OMFS- Writer spoke to patient's father regarding insurance authorization/OR date. Advised date is tentatively set for 9/25. Will update again once a decision is received from insurance company.

## 2022-12-22 ENCOUNTER — Ambulatory Visit: Payer: Medicare Other

## 2022-12-22 DIAGNOSIS — Z87820 Personal history of traumatic brain injury: Secondary | ICD-10-CM

## 2022-12-22 DIAGNOSIS — G8250 Quadriplegia, unspecified: Secondary | ICD-10-CM

## 2022-12-22 NOTE — Progress Notes (Signed)
Physical Medicine & Rehabilitation  Occupational Therapy  Daily Note        Name:  Angel Wells  MRN:  [id number]  Diagnosis:    Encounter Diagnoses   Name Primary?    Spastic quadriplegia Yes    History of traumatic brain injury    Today's Date:  12/22/2022    Subjective: [name] caregiver reports that splint that was adjusted last session now has a crack in it and is "floppy." Reports that they have been stretching at home.     Pain: did not rate    Intervention:  TREATMENT TIME  DESCRIPTION   ADL Training 30 min Pt trialing self-feeding during session with raised lip plate brought in from home and built up utensil brought in from home with universal cuff handle. Pt requiring assist to load food onto utensil. Pt able to bring food to mouth with CGA-min A to keep utensil level and reduce spillage.     Pt trialing various utensils including universal cuff with standard utentsil, built up utensil without universal cuff, and universal cuff with wrist support. Pt doing best with universal cuff with built up utensil. Would benefit from further practice. Recommending bendable utensils, provided with handout for purchase.     Pt trialing drinking from cup with universal cuff/adapted handle. Pt able to grasp appropriately, but difficult today d/t cup being too wide for cuff. Would benefit from further practice.                SPLINT:  Orthotic Training:  Designed and fabricated custom anti-spasticity thermoplastic splint.  Affected Body Part:  Right, Arm   Splint Type: Anti-spasticity splint  Wearing Schedule: During the day                 Patient Education: Instructed patient in splint care, wear & precautions, Patient demonstrated understanding of instructions                 Assessment: Splint fits well.     Plan: Continue OT    Clearnce Sorrel, OT  Minutes:  Total time: 60  Timed mins: 30   Untimed: 30   Unbillable: 0

## 2022-12-23 ENCOUNTER — Other Ambulatory Visit: Payer: Self-pay | Admitting: Oncology

## 2022-12-23 DIAGNOSIS — J4540 Moderate persistent asthma, uncomplicated: Secondary | ICD-10-CM

## 2022-12-26 ENCOUNTER — Encounter: Payer: Self-pay | Admitting: Student in an Organized Health Care Education/Training Program

## 2022-12-26 NOTE — Progress Notes (Signed)
Incoming paperwork for completion to the [facility]  Date Received: 12/26/2022    Type of paperwork:   Sender/organization: [employer]  Form type: Physician Order - Med Review/Neck Brace    Completed paperwork to be sent to:  Fax to [phone]    Placed in box for:  Kyra Manges  2:00 PM  12/26/2022

## 2022-12-28 ENCOUNTER — Encounter: Payer: Self-pay | Admitting: Gastroenterology

## 2022-12-29 ENCOUNTER — Encounter: Payer: Self-pay | Admitting: Student in an Organized Health Care Education/Training Program

## 2022-12-29 ENCOUNTER — Other Ambulatory Visit: Payer: Self-pay

## 2022-12-29 ENCOUNTER — Ambulatory Visit
Payer: Medicare Other | Attending: Student in an Organized Health Care Education/Training Program | Admitting: Student in an Organized Health Care Education/Training Program

## 2022-12-29 VITALS — BP 121/71 | HR 94 | Temp 98.6°F | Wt 181.3 lb

## 2022-12-29 DIAGNOSIS — Z0001 Encounter for general adult medical examination with abnormal findings: Secondary | ICD-10-CM | POA: Insufficient documentation

## 2022-12-29 DIAGNOSIS — Z1329 Encounter for screening for other suspected endocrine disorder: Secondary | ICD-10-CM | POA: Insufficient documentation

## 2022-12-29 DIAGNOSIS — E119 Type 2 diabetes mellitus without complications: Secondary | ICD-10-CM

## 2022-12-29 MED ORDER — GUAIFENESIN 400 MG PO TABS *I*
ORAL_TABLET | ORAL | Status: DC
Start: 2022-12-29 — End: 2023-01-03

## 2022-12-29 MED ORDER — GENERIC DME *A*
Status: DC
Start: 2022-12-29 — End: 2023-10-10
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
## 2023-01-03 ENCOUNTER — Other Ambulatory Visit
Admission: RE | Admit: 2023-01-03 | Discharge: 2023-01-03 | Disposition: A | Discharge status: Home / Self Care | Payer: Medicare Other | Source: Ambulatory Visit

## 2023-01-03 ENCOUNTER — Other Ambulatory Visit: Payer: Self-pay | Admitting: Gastroenterology

## 2023-01-03 ENCOUNTER — Ambulatory Visit: Payer: Medicare Other | Attending: Neurology | Admitting: Neurology

## 2023-01-03 ENCOUNTER — Other Ambulatory Visit: Payer: Self-pay | Admitting: Student in an Organized Health Care Education/Training Program

## 2023-01-03 ENCOUNTER — Other Ambulatory Visit: Payer: Self-pay

## 2023-01-03 DIAGNOSIS — Z794 Long term (current) use of insulin: Secondary | ICD-10-CM

## 2023-01-03 DIAGNOSIS — G40219 Localization-related (focal) (partial) symptomatic epilepsy and epileptic syndromes with complex partial seizures, intractable, without status epilepticus: Secondary | ICD-10-CM | POA: Insufficient documentation

## 2023-01-03 DIAGNOSIS — E119 Type 2 diabetes mellitus without complications: Secondary | ICD-10-CM | POA: Insufficient documentation

## 2023-01-03 DIAGNOSIS — E1165 Type 2 diabetes mellitus with hyperglycemia: Secondary | ICD-10-CM | POA: Insufficient documentation

## 2023-01-03 LAB — CBC
Hematocrit: 40 % (ref 37–52)
Hemoglobin: 13.5 g/dL (ref 12.0–17.0)
MCV: 89 fL (ref 75–100)
Platelets: 255 10*3/uL (ref 150–450)
RBC: 4.5 MIL/uL (ref 4.0–6.0)
RDW: 13.2 % (ref 0.0–15.0)
WBC: 9.2 10*3/uL (ref 3.5–11.0)

## 2023-01-03 LAB — COMPREHENSIVE METABOLIC PANEL
ALT: 76 U/L — ABNORMAL HIGH (ref 0–50)
AST: 34 U/L (ref 0–50)
Albumin: 4.6 g/dL (ref 3.5–5.2)
Alk Phos: 66 U/L (ref 40–130)
Anion Gap: 18 — ABNORMAL HIGH (ref 7–16)
Bilirubin,Total: 0.3 mg/dL (ref 0.0–1.2)
CO2: 21 mmol/L (ref 20–28)
Calcium: 9.9 mg/dL (ref 9.0–10.3)
Chloride: 95 mmol/L — ABNORMAL LOW (ref 96–108)
Creatinine: 0.47 mg/dL — ABNORMAL LOW (ref 0.67–1.17)
Glucose: 116 mg/dL — ABNORMAL HIGH (ref 60–99)
Lab: 10 mg/dL (ref 6–20)
Potassium: 4.5 mmol/L (ref 3.3–5.1)
Sodium: 134 mmol/L (ref 133–145)
Total Protein: 8.1 g/dL — ABNORMAL HIGH (ref 6.3–7.7)
eGFR BY CREAT: 143 *

## 2023-01-03 LAB — MULTIPLE ORDERING DOCS

## 2023-01-03 LAB — VALPROIC ACID LEVEL, TOTAL: Valproic Acid: 48 ug/mL — ABNORMAL LOW (ref 50–100)

## 2023-01-03 LAB — UNABLE TO VOID

## 2023-01-03 NOTE — Telephone Encounter (Signed)
Patient Demographic Information [facility])  [facility] First Name Last Name Birth Date Gender Street Address [facility] Zip Code   Angel Wells 1991-06-27 Male [address] [place] [address]   Search:  [facility] My Rx Current Rx Drug Type Rx Written Rx Dispensed Drug Quantity Days Supply Prescriber Name Prescriber [facility] # Payment Method Dispenser   Angel N Y  12/07/2022 12/18/2022 lacosamide 10 mg/ml solution 600 30 Azucena Kuba (MD) [id number] Insurance [facility]   Angel N N  06/16/2022 11/17/2022 lacosamide 10 mg/ml solution 600 30 Azucena Kuba (MD) [id number] Insurance [facility]   Angel N N  06/16/2022 10/20/2022 lacosamide 10 mg/ml solution 600 30 Azucena Kuba ([place]) [id number] Insurance [facility]   Angel N N  06/16/2022 09/18/2022 lacosamide 10 mg/ml solution 600 30 Azucena Kuba (MD) [id number] Insurance [facility]   Angel N N  06/16/2022 08/23/2022 lacosamide 10 mg/ml solution 600 30 Azucena Kuba (MD) [id number] Insurance [facility]   Angel N N  06/16/2022 07/28/2022 lacosamide 10 mg/ml solution 600 30 Azucena Kuba (MD) [id number] Insurance [facility]   Angel N N  06/16/2022 06/27/2022 lacosamide 10 mg/ml solution 600 30 Azucena Kuba (MD) [id number] Insurance [facility]   Angel N N  01/12/2022 05/30/2022 lacosamide 10 mg/ml solution 600 30 Azucena Kuba ([place]) [id number] Insurance [facility]   Angel N N  01/12/2022 05/05/2022 lacosamide 10 mg/ml solution 600 30 Azucena Kuba (MD) [id number] Insurance [facility]   Angel N N B 03/17/2022 03/20/2022 clonazepam 1 mg tablet 10 5 [name] [id number] Insurance [facility]   Angel N N  01/12/2022 03/09/2022 lacosamide 10 mg/ml solution 600 30 Azucena Kuba (MD) [id number] Insurance [facility]   Angel N N  01/12/2022 02/10/2022 lacosamide 10 mg/ml solution 600 30 Azucena Kuba (MD) [id number] Insurance [facility]   Angel N N  01/12/2022 01/13/2022 lacosamide 10 mg/ml solution 600 30 Azucena Kuba (MD)  [id number] Insurance [facility]

## 2023-01-03 NOTE — Progress Notes (Signed)
I saw Angel Wells in the General Neurology Clinic at [facility] on 01/03/2023 for follow-up evaluation of his medically intractable seizure disorder.  [name], his primary nurse, accompanied him today.  I last saw him on 11/09/2021.  I transcribed the history of present illness below during my discussion with Angel Wells and [name], though it was not written verbatim.    HISTORY OF PRESENT ILLNESS:  He is doing well.  He has seizures a few times a month, and they are mostly under 10 seconds in duration.  He spends  a lot of time at medical appointments.  He did some bowling in the winter.  He enjoys watching video games.  He participated in occupational therapy with the goal of improving his independent function with his right hand, in particular so he can feed himself with his right hand and possibly play a video game independently.      EXAMINATION:  No formal examination was performed today.       LABORATORY DATA:   (11/11/2021) sodium 130, BUN 7, creatinine 0.31, ALT 45, AST 18, alk phos 61WBC 8.4, Hgb 11.9, Hct 35, platelets 255, valproic acid 61    IMPRESSION/DISCUSSION:   This is a 32 year old man with a medically intractable focal seizure disorder with seizures that cause impairment of consciousness.  His seizure frequency is relatively low and the duration of his seizures is short with his current regimen.  His father contacted me after our last meeting to let me know that he does not want to make any changes in Angel Wells anti-seizure medication regimen.  He told me that he prefers that Angel Wells have occasional mild seizures to him being sedated with medications.  He recalled that a previous neurologist told him that Angel Wells body needed occasional seizures, and that made sense to him.  We did not make any medication changes today.  I explained to [name] the clinical features that distinguish myoclonic jerks, rhythmic clonus, and rhythmic tremors from one another and features of his clinical  episodes that suggest that they are likely seizures.       PLAN:  He will continue lacosamide 10 mg/mL 10 mL per tube twice daily, levetiracetam 100 mg/mL 15 mL per tube twice daily, and valproate 50 mg/mL 5 mL per tube three times daily.  He will have blood drawn to check a CBC, comprehensive metabolic profile, and valproic acid level.  I will be in touch with his father when I know the results.  I will see him back in one year.  I would be happy to see him sooner if needed.      Azucena Kuba, MD  Professor of Clinical Neurology  Pager: [phone]  Office phone: [phone]  Office fax: [phone]  01/03/2023 1:08 PM

## 2023-01-04 ENCOUNTER — Encounter: Payer: Self-pay | Admitting: Neurology

## 2023-01-04 LAB — HEMOGLOBIN A1C: Hemoglobin A1C: 5.2 %

## 2023-01-04 MED ORDER — GUAIFENESIN 400 MG PO TABS *I*
ORAL_TABLET | ORAL | 5 refills | Status: DC
Start: 2023-01-04 — End: 2023-04-27

## 2023-01-05 ENCOUNTER — Ambulatory Visit: Payer: Medicare Other

## 2023-01-05 ENCOUNTER — Other Ambulatory Visit
Admission: RE | Admit: 2023-01-05 | Discharge: 2023-01-05 | Disposition: A | Discharge status: Home / Self Care | Payer: Medicare Other | Source: Ambulatory Visit | Attending: Oncology | Admitting: Oncology

## 2023-01-05 ENCOUNTER — Other Ambulatory Visit: Payer: Self-pay

## 2023-01-05 DIAGNOSIS — Z9911 Dependence on respirator [ventilator] status: Secondary | ICD-10-CM

## 2023-01-05 DIAGNOSIS — E1165 Type 2 diabetes mellitus with hyperglycemia: Secondary | ICD-10-CM | POA: Insufficient documentation

## 2023-01-05 DIAGNOSIS — G8250 Quadriplegia, unspecified: Secondary | ICD-10-CM

## 2023-01-05 DIAGNOSIS — Z87820 Personal history of traumatic brain injury: Secondary | ICD-10-CM

## 2023-01-05 LAB — MICROALBUMIN, URINE, RANDOM
Creatinine,UR: 64 mg/dL (ref 20–300)
Microalbumin,UR: <1.2 mg/dL

## 2023-01-05 NOTE — Progress Notes (Signed)
Physical Medicine & Rehabilitation  Occupational Therapy  Daily Note        Name:  Lamell Bohlander  MRN:  [id number]  Diagnosis:    Encounter Diagnoses   Name Primary?    Spastic quadriplegia Yes    History of traumatic brain injury     Ventilator dependence    Today's Date:  01/05/2023    Subjective: [name] aid reports he now has bendable built up silverware.     Pain: 0/10     Intervention:  TREATMENT TIME  DESCRIPTION   ADL Training 30 min   Self-feeding training  -pt eating snack with raised lip plate, trialing bendable built up silverware, various adjustments of angle of silverware to be able to stab food and bring food to mouth, with universal cuff, R hand, able to stab and bring food to mouth successfully ~70% of opportunities, adjustments to positioning by raising elbow and lowering table height               Patient education: Discussed progress towards goals, Discussed/Reviewed treatment plan    Assessment: Pt able to perform self-feeding today with improved independence and reduced spillage, able to perform self-feeding with ~70% accuracy with adjustments to positioning and AE. Plan to address adapted writing and gaming next session to increase functional independence. Continue to follow.    Plan: Continue OT    Clearnce Sorrel, OT  Minutes:  Total time: 30  Timed mins: 30   Untimed: 0   Unbillable: 0

## 2023-01-09 NOTE — Progress Notes (Signed)
Possible duplicate received. Placed in providers box for reviwew.

## 2023-01-15 ENCOUNTER — Other Ambulatory Visit: Payer: Self-pay | Admitting: Student in an Organized Health Care Education/Training Program

## 2023-01-17 ENCOUNTER — Encounter: Payer: Self-pay | Admitting: Gastroenterology

## 2023-01-17 MED ORDER — TOUJEO MAX SOLOSTAR 300 UNIT/ML SC SOPN
64.0000 [IU] | PEN_INJECTOR | Freq: Every evening | SUBCUTANEOUS | 0 refills | Status: DC
Start: 2023-01-17 — End: 2023-01-29

## 2023-01-18 ENCOUNTER — Encounter: Payer: Self-pay | Admitting: Student in an Organized Health Care Education/Training Program

## 2023-01-18 NOTE — Progress Notes (Signed)
[facility]coming [facility]  Date Received: 01/18/2023    Type of paperwork:   Sender/organization: [employer]  Form type: Physician Orders/Home Health Cert & Plan of Care    Any specific/special request? no    Copy and scan before mailing? Yes    Completed paperwork to be sent to:  Fax to [phone]    Placed in box for:  Jacqlyn Krauss  11:17 AM  01/18/2023

## 2023-01-19 ENCOUNTER — Other Ambulatory Visit: Payer: Self-pay | Admitting: Student in an Organized Health Care Education/Training Program

## 2023-01-19 ENCOUNTER — Encounter: Payer: Self-pay | Admitting: Gastroenterology

## 2023-01-19 NOTE — Progress Notes (Signed)
Outgoing paperwork:    Completed paperwork received on  01/19/2023  Sent to scanning? Yes  Sent as outlined in prior documentation:  [phone]     Lizbeth Bark  2:48 PM  01/19/2023

## 2023-01-25 ENCOUNTER — Telehealth: Payer: Self-pay | Admitting: Student in an Organized Health Care Education/Training Program

## 2023-01-25 ENCOUNTER — Other Ambulatory Visit: Payer: Self-pay | Admitting: Neurology

## 2023-01-25 DIAGNOSIS — G40219 Localization-related (focal) (partial) symptomatic epilepsy and epileptic syndromes with complex partial seizures, intractable, without status epilepticus: Secondary | ICD-10-CM

## 2023-01-25 NOTE — Telephone Encounter (Signed)
[facility] TELEPHONE INTAKE    Reason for call: [name] requesting a call back states parents bought a different kind of probiotic than what was ordered and would like to know if ok to use what they bought. 4 billion live cultures with vitamin D.    Name of caller: [name]  Relationship to patient: nurse  Organization:   Phone:  [phone]  Fax:

## 2023-01-25 NOTE — Telephone Encounter (Signed)
[name] says that probiotic is not covered by insurance. When pt needs refill they will buy the most cost effective brand. Most recently they have bought the [employer] brand probiotic. This does have Vit D included. Writer sees that last Vit D was 4 years ago and in normal range.      Writer expressed that this is likely ok to give in place of CULTURELLE that was ordered. Will route to PCP for review.

## 2023-01-25 NOTE — Telephone Encounter (Signed)
I did not authorize the renewal of the lacosamide prescription.  I sent a 30-day prescription with five refills to [facility] on 12/07/2022.

## 2023-01-26 NOTE — Telephone Encounter (Signed)
Angel Wells requesting update

## 2023-01-27 ENCOUNTER — Other Ambulatory Visit: Payer: Self-pay | Admitting: Primary Care

## 2023-01-27 ENCOUNTER — Other Ambulatory Visit: Payer: Self-pay | Admitting: Oncology

## 2023-01-27 ENCOUNTER — Other Ambulatory Visit: Payer: Self-pay

## 2023-01-27 DIAGNOSIS — R569 Unspecified convulsions: Secondary | ICD-10-CM

## 2023-01-27 DIAGNOSIS — M2450 Contracture, unspecified joint: Secondary | ICD-10-CM

## 2023-01-29 ENCOUNTER — Other Ambulatory Visit: Payer: Self-pay | Admitting: Oncology

## 2023-01-29 MED ORDER — TOUJEO MAX SOLOSTAR 300 UNIT/ML SC SOPN
64.0000 [IU] | PEN_INJECTOR | Freq: Every evening | SUBCUTANEOUS | 0 refills | Status: DC
Start: 2023-01-29 — End: 2023-04-20

## 2023-01-29 NOTE — Telephone Encounter (Signed)
Writer spoke to [place], [place]. [name] requesting update regarding patient receiving probiotic.   [phone]

## 2023-01-30 NOTE — Progress Notes (Signed)
Outgoing paperwork:    Completed paperwork received on  01/30/2023  Sent to scanning? Yes  Sent as outlined in prior documentation: Fax to [phone]    [name]  8:47 AM  01/30/2023

## 2023-01-30 NOTE — Telephone Encounter (Signed)
Outgoing paperwork:    Completed paperwork received on  01/30/2023  Sent to scanning? Yes  Sent as outlined in prior documentation: Fax to [phone]    [name]  10:09 AM  01/30/2023

## 2023-02-02 ENCOUNTER — Encounter: Payer: Self-pay | Admitting: Gastroenterology

## 2023-02-02 ENCOUNTER — Ambulatory Visit: Payer: Medicare Other | Attending: Student in an Organized Health Care Education/Training Program

## 2023-02-02 ENCOUNTER — Other Ambulatory Visit: Payer: Self-pay

## 2023-02-02 DIAGNOSIS — Z87820 Personal history of traumatic brain injury: Secondary | ICD-10-CM | POA: Insufficient documentation

## 2023-02-02 DIAGNOSIS — G8250 Quadriplegia, unspecified: Secondary | ICD-10-CM | POA: Insufficient documentation

## 2023-02-02 DIAGNOSIS — Z9911 Dependence on respirator [ventilator] status: Secondary | ICD-10-CM | POA: Insufficient documentation

## 2023-02-02 NOTE — Progress Notes (Signed)
Physical Medicine & Rehabilitation  Occupational Therapy  Daily Note        Name:  Angel Wells  MRN:  [id number]  Diagnosis:    Encounter Diagnosis   Name Primary?    Spastic quadriplegia Yes   Today's Date:  02/02/2023    Subjective: Pt and aid reporting no new major updates.    Pain: did not rate    Intervention:  TREATMENT TIME  DESCRIPTION   Neuromuscular Re-Education 20 min Adjustment of splint to improve fit    Instructed in cervical AROM exercises per pt caregiver rest to assist with head control and positioning, added to HEP   ADL training                IADL training 20 min                20 min Feeding:   -dycem underneath plate, high lip plate, bendable utensils, elbow supported and built up by towels, able to self feed 10 bits with spoon and 5 bites with fork with CGA, improvement noted        Discussion of adapted game controller, reviewed options including switch activated game controller, pt caregiver reporting that they would like to work on remote control first, given contact information for [employer] company that specializes in assistive technology       Briefly discussed handwriting, briefly trialed wanchik premade handwriting splint, may benefit from further practice and custom handwriting splint in future          Assessment: Pt progressed with self-feeding now able to feed self without physical assistance today. Discussed options for adapted game controller and briefly discussed handwriting. Will address more next session. Will also plan to complete progress report next session. Continue to follow.    Plan: Continue OT    Clearnce Sorrel, OT  Minutes:  Total time: 60  Timed mins: 60   Untimed: 0   Unbillable: 0

## 2023-02-08 ENCOUNTER — Encounter: Payer: Self-pay | Admitting: Student in an Organized Health Care Education/Training Program

## 2023-02-08 NOTE — Progress Notes (Signed)
Incoming [facility]  Date Received: 02/08/2023    Type of paperwork:   Sender/organization: [employer]  Form type:  [employer]    Any specific/special request?     Copy and scan before mailing? Yes    Completed paperwork to be sent to:  Fax to [phone]    Placed in box for:  Fredda Hammed  3:30 PM  02/08/2023

## 2023-02-10 ENCOUNTER — Other Ambulatory Visit: Payer: Self-pay | Admitting: Student in an Organized Health Care Education/Training Program

## 2023-02-10 DIAGNOSIS — Z8669 Personal history of other diseases of the nervous system and sense organs: Secondary | ICD-10-CM

## 2023-02-13 ENCOUNTER — Telehealth: Payer: Self-pay | Admitting: Student in an Organized Health Care Education/Training Program

## 2023-02-13 NOTE — Telephone Encounter (Signed)
Per mail dropped off from [place], pt needs 2015 form

## 2023-02-16 ENCOUNTER — Encounter: Payer: Self-pay | Admitting: Student in an Organized Health Care Education/Training Program

## 2023-02-16 ENCOUNTER — Ambulatory Visit: Payer: Medicare Other | Attending: Student in an Organized Health Care Education/Training Program

## 2023-02-16 ENCOUNTER — Other Ambulatory Visit: Payer: Self-pay

## 2023-02-16 DIAGNOSIS — Z87820 Personal history of traumatic brain injury: Secondary | ICD-10-CM | POA: Insufficient documentation

## 2023-02-16 DIAGNOSIS — Z9911 Dependence on respirator [ventilator] status: Secondary | ICD-10-CM | POA: Insufficient documentation

## 2023-02-16 DIAGNOSIS — G8250 Quadriplegia, unspecified: Secondary | ICD-10-CM | POA: Insufficient documentation

## 2023-02-16 NOTE — Telephone Encounter (Signed)
2015 Forms  Date Uploaded Trans Type Expiration Date Status  02/16/2023 Wheelchair 02/16/2024 Approved

## 2023-02-16 NOTE — Progress Notes (Signed)
Physical Medicine & Rehabilitation  Occupational Therapy  Progress Note    Sent via: eRecord EMR INBASKET  Physician attestation for [facility] Plan of Care: Physician/NP/PA: Zollie Scale, DO    Per signature, I have reviewed and agree with the documented plan of care.    X______________________________________________________   Date:_____    Please sign this Medicare plan of care for outpatient therapy treatment as required by Medicare. If you have any questions, please contact us at [phone].  We appreciate your prompt attention to this request.    Sincerely,   [facility]      Name:  Angel Wells  MRN:  [id number]  Diagnosis:    Encounter Diagnoses   Name Primary?    Spastic quadriplegia Yes    History of traumatic brain injury     Ventilator dependence    Today's Date:  02/16/2023    Subjective: Pt reports neck exercises going well.     Pain: 0/10     Intervention:  TREATMENT TIME  DESCRIPTION   Neuromuscular Re-Education          ADL training 30 min          30 min Reassessment of pt's progress towards functional goals  Adjustment of R splint to improve fit      Self-feeding:  -pt able to self feed today with bendable utensil using R hand with velcro universal cuff straps to hold in place 10-15 bites with min A for supination but able to stab food and bring to mouth with CGA, with elbow raised, dycem underneath plate               Performance Deficits     ADL Status:  Impaired    Self Care Independent Modified   Independent Minimal  Assistance Moderate  Assistance Maximal  Assistance Dependent   Eating     X Once food on utensil able to place in mouth  Improving, has demonstrated ability to self-feed with CGA, continue to work on         Grooming           x   Bathing           X bed bath daily   Upper Body  Dressing           x   Lower Body  Dressing           x   Toileting           x      Instrumental ADL/Community status: Impaired       Independent Modified Indendpendent  Minimal  Assistance Moderate   Assistance Maximal  Assistance   Homecare          x   Laundry         x   Shopping         x   Meal preparation          x   Medication  Management         x   Childcare         na   Phone Use          x      Transfers: Impaired     -uses overhead hoyer lift, dependant for all transfers  -uses manual WC for mobility     Vision:  impaired, but not currently using glasses.  Corrective lenses: No    Cognition: History of traumatic  brain injury with resulting cognitive impairments. Formal cognitive testing not completed at this time.      Upper Extremity Assessment     Range of Motion: (unnoted implies WFL)  Shoulder    UE AROM (PROM) AROM (PROM)     Right Left   Scapula:       Protraction        trace trace   Retraction trace trace   Elevation 1/4 range 1/4 range   Depression       Shoulder:       Extension/Flexion 60 (135)  unchanged 30 (135)  unchanged   Abduction 55 (115)  unchanged 40 (115)  unchanged   Adduction       Horizontal Abduction       Horizontal Adduction       External Rotation/Internal Rotation       , Elbow    UE  AROM (PROM)  AROM (PROM)      Right  Left    Elbow Extension/Flexion full/120 Rests in flexion (full )/105   Supination/Pronation       , and Wrist    UE  AROM (PROM)   AROM (PROM)     Right  Left    Wrist Extension/Flexion Trace (can get to neutral with PROM)/rests in flexion  unchanged Rests in flexion (can get to neutral with PROM)  unchanged   Radial/Ulnar Deviation           -L digits PIP contractures preventing full PROM  able to achieve 3/4 range extension, improving  -R Digits full PROM  -Some volitional grasp and release present with R hand although minimal now demonstrating two point pinch emerging  -B hands presented in flx     Strength: Manual Muscle Testing scores __/5 (unnoted implies [facility])   Measured unchanged  Motion Right Left   Scapular elevation       Shoulder flexion 2 2   Shoulder abduction 2 2   Internal rotation       External rotation        Elbow flexion 3 3   Elbow extension 3- 3-   Supination       Pronation       Wrist flexion 3 3   Wrist extension trace                        0      Grip/Pinch Assessment: No tested due to tone and hand function      Sensation: intact     Tone:  R UE   Elbow: 0 unchanged  Wrist flexors: 2, clonus  unchanged  Digit flexors: 1 , clonus  improved        LUE  Elbow: 1 unchanged  Wrist flexors: 3, clonus     unchanged  Digit flexors: 1+, clonus    unchanged     Hand function: RHD, able to use universal cuff and built up handle to hold items when placed in hand. Unable to grasp cone, foam, or 1in cube when placed on table but once in R hand can grasp with no functional release of object. Clonus observed when grasping item. can now functionally grasp (with two point pinch) and release 1 inch block without set up with SBA, improved      Patient Education:  Discussed progress towards goals, Discussed/Reviewed treatment plan  Education Point Discipline Responsible Teaching Method  Verbal, Written, Video, or Demonstration Learner  Patient, Family, or  Caregiver Date Education Initiated Date Completed   Durable Medical Equipment Physical Therapy  Occupational Therapy           Activities of Daily Living Occupational Therapy Verbal  Patient and caregiver 12/14/22     Bathroom Accessibility Occupational Therapy, Physical Therapy           Upper Extremity Orthotics Occupational Therapy           Independent Living / Chartered loss adjuster Work Engineer, water Health Information Occupational Therapy            Cognition Speech Language Pathology  Occupational Therapy           Safety in Environment  Occupational Therapy, Physical Therapy              Goals:   STG  Pt will be educated in, demonstrate understanding of, and implement AE, compensatory strategies, and activity modifications for eating, writing, and leisure activities- ongoing met leisure and eating,  continue writing  [name] caregiver will demonstrate understanding of stretching and neuroinhibitive strategies to prevent hypertonicity in bilateral UE- met     LTG  Pt will improve functional use of hand as evidenced by being able to functionally grasp and release 5 blocks with R hand met  Pt will be able to self-feed with MI using AE, compensatory strategies, and activity modifications- ongoing  Pt will be able to perform handwriting with mod A using AE, compensatory strategies, and activity modifications ongoing  Pt will be able to perform gaming with min A with adaptations as needed- discontinue, given contact information assistive technology specialist    Assessment: Pt demonstrating some improved passive left digit extension, volitional grasp and release of R hand, and reduced spasticity of R digit flexors. Pt making some progress with feeding although not yet able to consistently feed self without physical assist. Benefits from raised lip plate, dycem under plate, built up handles, bendable utensils, universal cuff, and raised elbow, demonstrating difficulty with supination and may benefit from more positioning adjustments. Does have consult with AT specialist scheduled to address remote control/game controller. Will plan to address writing next session. Continue to follow.    Plan: Continue OT    Clearnce Sorrel, OT  Minutes:  Total time: 60  Timed mins: 60   Untimed: 0   Unbillable: 0

## 2023-02-19 ENCOUNTER — Other Ambulatory Visit: Payer: Self-pay | Admitting: Student in an Organized Health Care Education/Training Program

## 2023-02-19 DIAGNOSIS — R569 Unspecified convulsions: Secondary | ICD-10-CM

## 2023-02-20 ENCOUNTER — Other Ambulatory Visit: Payer: Self-pay

## 2023-02-20 ENCOUNTER — Other Ambulatory Visit: Payer: Self-pay | Admitting: Oncology

## 2023-02-21 NOTE — Progress Notes (Signed)
Incoming [facility]  Date Received: 02/16/2023    Type of paperwork:   Sender/organization: [employer]   Form type: Reasonable Accomiation/Modification Request Form    Copy and scan before pick up? Yes    Completed paperwork to be sent to:  Call for pick up [phone]    Placed in box for:  Kyra Manges  2:17 PM  02/21/2023

## 2023-02-21 NOTE — Progress Notes (Signed)
Outgoing paperwork:    Completed paperwork received on  02/21/2023  Sent to scanning? Yes  Sent as outlined in prior documentation: Fax to [phone]     Sherron Ales  2:59 PM  02/21/2023

## 2023-02-23 ENCOUNTER — Ambulatory Visit: Payer: Medicare Other

## 2023-02-23 ENCOUNTER — Other Ambulatory Visit: Payer: Self-pay

## 2023-02-23 DIAGNOSIS — Z87820 Personal history of traumatic brain injury: Secondary | ICD-10-CM

## 2023-02-23 DIAGNOSIS — G8250 Quadriplegia, unspecified: Secondary | ICD-10-CM

## 2023-02-23 DIAGNOSIS — Z9911 Dependence on respirator [ventilator] status: Secondary | ICD-10-CM

## 2023-02-23 NOTE — Progress Notes (Signed)
Physical Medicine & Rehabilitation  Occupational Therapy  Daily Note        Name:  Angel Wells  MRN:  [id number]  Diagnosis:    Encounter Diagnoses   Name Primary?    Spastic quadriplegia Yes    History of traumatic brain injury     Ventilator dependence    Today's Date:  02/23/2023    Subjective: Pt reporting consultation for assistive technology specialist is scheduled for next week.     Pain: did not rate    Intervention:  TREATMENT TIME  DESCRIPTION   ADL Training                IADL training 30 min                30 min Self-feeding:  -pt able to self feed today with angled utensil using R hand with velcro universal cuff strap to hold in place, able to self feed 5 bites including stabbing food with CGA with elbow raised, dycem underneath plate, built up lip of plate    Able to write/draw with built up pen inside of foam tubing with coban wrapped around foam tubing for wider handle, able to grasp with gross grasp with supervision/SBA               Patient education: Discussed progress towards goals, Discussed/Reviewed treatment plan    Assessment: Pt benefiting from activity modifications now able to self-feed with CGA and write with grossly SBA. Will follow up in 1 month and reassess progress/ensure consistency.     Plan: Continue OT, decrease frequency to FUV in 1 month    Clearnce Sorrel, OT  Minutes:  Total time: 60  Timed mins: 60   Untimed: 0   Unbillable: 0

## 2023-02-26 ENCOUNTER — Encounter: Payer: Self-pay | Admitting: Gastroenterology

## 2023-02-26 NOTE — Progress Notes (Signed)
Outgoing paperwork:    Completed paperwork received on  02/26/2023  Sent to scanning? Yes  Sent as outlined in prior documentation: Fax to [facility], [phone] and Call for pick up Angel Wells, [phone]    [name]  4:04 PM  02/26/2023

## 2023-03-05 ENCOUNTER — Other Ambulatory Visit: Payer: Self-pay | Admitting: Student in an Organized Health Care Education/Training Program

## 2023-03-06 ENCOUNTER — Encounter: Payer: Self-pay | Admitting: Student in an Organized Health Care Education/Training Program

## 2023-03-06 NOTE — Progress Notes (Signed)
Incoming paperwork for [facility]  Date Received: 03/06/2023    Type of paperwork:   Sender/organization: [employer]  Form type: Updated Plan of Care    Completed paperwork to be sent to:  Fax to [phone]    Placed in box for:  Kyra Manges  10:05 AM  03/06/2023

## 2023-03-07 ENCOUNTER — Encounter: Payer: Self-pay | Admitting: Gastroenterology

## 2023-03-07 NOTE — Progress Notes (Signed)
Outgoing paperwork:    Completed paperwork received on  03/07/2023  Sent to scanning? Yes  Sent as outlined in prior documentation: [phone]     Lizbeth Bark  9:48 AM  03/07/2023

## 2023-03-10 ENCOUNTER — Encounter: Payer: Self-pay | Admitting: Gastroenterology

## 2023-03-14 ENCOUNTER — Telehealth: Payer: Self-pay

## 2023-03-14 NOTE — Telephone Encounter (Signed)
Patient's nurse called with questions regarding patient's procedure.  Please follow up.

## 2023-03-15 NOTE — Telephone Encounter (Signed)
Writer called listed phone number and got VM. Writer left a message as well as clinic phone number to return call at their earliest convenience.

## 2023-03-16 ENCOUNTER — Telehealth: Payer: Self-pay | Admitting: Student in an Organized Health Care Education/Training Program

## 2023-03-16 ENCOUNTER — Other Ambulatory Visit: Payer: Self-pay | Admitting: Student in an Organized Health Care Education/Training Program

## 2023-03-16 ENCOUNTER — Other Ambulatory Visit: Payer: Self-pay | Admitting: Dental General Practice

## 2023-03-16 DIAGNOSIS — K0263 Dental caries on smooth surface penetrating into pulp: Secondary | ICD-10-CM | POA: Insufficient documentation

## 2023-03-16 DIAGNOSIS — Z8701 Personal history of pneumonia (recurrent): Secondary | ICD-10-CM

## 2023-03-16 DIAGNOSIS — K011 Impacted teeth: Secondary | ICD-10-CM | POA: Insufficient documentation

## 2023-03-16 NOTE — Telephone Encounter (Signed)
Please fax to [phone]

## 2023-03-16 NOTE — Telephone Encounter (Signed)
Writer called nurse [name] and discussed the date of the upcoming procedure as well as if Sandor's vent needed to be brought for surgery and when to get consent from his dad. Writer will follow up next week when more information is received in the office.

## 2023-03-16 NOTE — Telephone Encounter (Signed)
Erroneous Encounter. Please Disregard.

## 2023-03-20 MED ORDER — TOBRAMYCIN 300 MG/5ML IN NEBU *I*
300.0000 mg | INHALATION_SOLUTION | Freq: Two times a day (BID) | RESPIRATORY_TRACT | 5 refills | Status: DC
Start: 2023-03-20 — End: 2023-08-23

## 2023-03-20 NOTE — Telephone Encounter (Signed)
Jody called from [facility] and stated that clinical notes are needed for script for tobramycin to show it is needed for Bronchiectasis    Fax to: [phone]

## 2023-03-21 ENCOUNTER — Other Ambulatory Visit: Payer: Self-pay

## 2023-03-21 ENCOUNTER — Telehealth: Payer: Self-pay | Admitting: Student in an Organized Health Care Education/Training Program

## 2023-03-21 ENCOUNTER — Ambulatory Visit: Payer: Medicare Other | Attending: Student in an Organized Health Care Education/Training Program

## 2023-03-21 DIAGNOSIS — Z23 Encounter for immunization: Secondary | ICD-10-CM | POA: Insufficient documentation

## 2023-03-21 NOTE — Progress Notes (Signed)
Flu Immunization     Angel Wells is here today for an influenza vaccine.     Influenza immunization reviewed and discussed.  Questions answered.    Verbal consent obtained.    No history of any of the following:                Severe allergy to eggs                 Severe allergy to Influenza vaccine                 History of Guillain-Barre Syndrome                Current illness or fever                No current pregnancy        Signed: Oleh Genin, RN on 03/21/2023 at 1:43 PM

## 2023-03-21 NOTE — Telephone Encounter (Addendum)
[facility] TELEPHONE INTAKE    Reason for call: Ariel states PA for tobramycin was denied  Ariel requested to speak with Marchelle Folks the nurse  Nurse was not available at this time  Writer will forward to PA Specialist    Name of caller: Ariel  Relationship to patient: Pharmacy     Phone:  [phone]    [phone]

## 2023-03-21 NOTE — Telephone Encounter (Signed)
[facility]  Prior Authorization Request     Date of request: 03/21/2023  Time of call: 2:48 PM     Medication Requiring PA: TOBI 300 MG/5ML nebulizer solution      Directions(dosing and frequency): Inhale 5 mLs (300 mg total) by nebulization every 12 hours for pseudomonas infection, trach/vented patient NEBULIZE 1 VIAL (300 MG) TWICE DAILY EVERY OTHER MONTH      Quantity/ Day Supply: 280/28     Dx code: E84.0, Z87.01     Request received from:[facility] PA Source: Pharmacy     Name and Phone Number of Pharmacy:     Additional Pharmacy Benefit plan: no     Secondary insurance: Medicaid     Please run secondary if not covered under primary: yes        Insurance coverage confirmed: yes     Covered Alternative Medication:      Tax ID: [id number]     Provider and NPI: Susette Racer - [id number]     Additional Justification:n/a     Key ID: [facility]     Please provide justification for PA submission & urgency or indicate which covered alternative was ordered

## 2023-03-22 ENCOUNTER — Other Ambulatory Visit: Payer: Self-pay | Admitting: Student in an Organized Health Care Education/Training Program

## 2023-03-22 ENCOUNTER — Telehealth: Payer: Self-pay

## 2023-03-22 NOTE — Telephone Encounter (Signed)
Surgery call placed to patient's mother, Angel Wells.  Left a message and reviewed upcoming oral surgery date for Wednesday September 25 and instructions.    Must have responsible adult to bring to and drive home from procedure  Covid testing is no longer required in most cases; if they are required to get covid test prior to surgery, one of the OMFS providers will call  Nothing to eat or drink after midnight prior to procedure; may take PO medications with small amount of water.  Provided number to OR and instructions to call day before surgery for exact time.    Reviewed medications and instructions regarding which need to be held.  Gave clinic number for any additional questions or concerns.

## 2023-03-22 NOTE — Telephone Encounter (Signed)
Received the below message regarding PA:    TOBRAMYCIN Nebu Soln is used in a nebulizer. A nebulizer is a piece of durable medical equipment (DME). Drugs used with DME in the home are covered under Medicare Part B. Our records show that you do not live in a long term care (LTC) facility. We cannot pay for drugs under Medicare Part D if they are covered under   Medicare Part A or B. We did not decide whether TOBRAMYCIN Nebu Soln is medically necessary. We made our decision only on the fact that we cannot pay for the drug under Medicare Part D.    Per Part B: Medicare B has specific coverage requirements for home nebulizer's and related medication, and it has come to [facility] attention that the provided clinical notes do not document a Medicare B billable indication/ICD10 for inhaled tobramycin for above patient. 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 Coding in medication list as "E84.0" only is not enough to substantive validity of the covered diagnosis (nor that therapy used for that diagnosis)    Available progress notes repeatedly document a history of pneumonia due to pseudomonas bacteria (Z87.01), pneumonia due to pseudomonas (J15.1), pseudomonas in trach vented patient, which we cannot bill Medicare B for inhaled tobramycin therapy.    Inhaled tobramycin therapy os only covered for bronchiectasis or CF with pulmonary manifestations.

## 2023-03-23 ENCOUNTER — Other Ambulatory Visit: Payer: Self-pay

## 2023-03-23 ENCOUNTER — Ambulatory Visit: Payer: Medicare Other | Attending: Student in an Organized Health Care Education/Training Program

## 2023-03-23 DIAGNOSIS — Z9911 Dependence on respirator [ventilator] status: Secondary | ICD-10-CM | POA: Insufficient documentation

## 2023-03-23 DIAGNOSIS — G8250 Quadriplegia, unspecified: Secondary | ICD-10-CM | POA: Insufficient documentation

## 2023-03-23 DIAGNOSIS — Z87820 Personal history of traumatic brain injury: Secondary | ICD-10-CM | POA: Insufficient documentation

## 2023-03-23 NOTE — Progress Notes (Unsigned)
Physical Medicine & Rehabilitation  Occupational Therapy  Progress Note    Sent via: eRecord EMR INBASKET  Physician attestation for [facility] Plan of Care: Physician/NP/PA: Zollie Scale, DO    Per signature, I have reviewed and agree with the documented plan of care.    X______________________________________________________   Date:_____    Please sign this Medicare plan of care for outpatient therapy treatment as required by Medicare. If you have any questions, please contact us at [phone].  We appreciate your prompt attention to this request.    Sincerely,   [facility]      Name:  Angel Wells  MRN:  [id number]  Diagnosis:    Encounter Diagnoses   Name Primary?    Spastic quadriplegia Yes    History of traumatic brain injury     Ventilator dependence    Today's Date:  03/23/2023    Subjective: eating utensils and cuff,        Pain: 0/10     Intervention:  TREATMENT TIME  DESCRIPTION   Neuromuscular Re-Education   30 min     Reassessment of pt's progress towards functional goals               Performance Deficits     ADL Status:  Impaired    Self Care Independent Modified   Independent Minimal  Assistance Moderate  Assistance Maximal  Assistance Dependent   Eating     X Once food on utensil able to place in mouth  has demonstrated ability to self-feed with CGA, continue to work on         Grooming           x   Bathing           X bed bath daily   Upper Body  Dressing           x   Lower Body  Dressing           x   Toileting           x      Instrumental ADL/Community status: Impaired       Independent Modified Indendpendent Minimal  Assistance Moderate   Assistance Maximal  Assistance   Homecare          x   Laundry         x   Shopping         x   Meal preparation          x   Medication  Management         x   Childcare         na   Phone Use          x      Transfers: Impaired     -uses overhead hoyer lift, dependant for all transfers  -uses manual WC for mobility      Vision:  impaired, but not currently using glasses.  Corrective lenses: No    Cognition: History of traumatic brain injury with resulting cognitive impairments. Formal cognitive testing not completed at this time.      Upper Extremity Assessment     Range of Motion: (unnoted implies WFL)  Shoulder    UE AROM (PROM) AROM (PROM)     Right Left   Scapula:       Protraction        trace trace   Retraction trace trace   Elevation 1/4  range 1/4 range   Depression       Shoulder:       Extension/Flexion 60 (135) 30 (135)   Abduction 55 (115) 40 (115)   Adduction       Horizontal Abduction       Horizontal Adduction       External Rotation/Internal Rotation       , Elbow    UE  AROM (PROM)  AROM (PROM)      Right  Left    Elbow Extension/Flexion full/120 Rests in flexion (full )/105   Supination/Pronation       , and Wrist    UE  AROM (PROM)   AROM (PROM)     Right  Left    Wrist Extension/Flexion Trace (can get to neutral with PROM)/rests in flexion Rests in flexion (can get to neutral with PROM)   Radial/Ulnar Deviation           -L digits PIP contractures preventing full PROM, able to achieve 3/4 range extension  -R Digits full PROM  -Some volitional grasp and release present with R hand although minimal, now demonstrating two point pinch emerging  -B hands presented in flx     Strength: Manual Muscle Testing scores __/5 (unnoted implies [facility])   Measured unchanged  Motion Right Left   Scapular elevation       Shoulder flexion 2 2   Shoulder abduction 2 2   Internal rotation       External rotation       Elbow flexion 3 3   Elbow extension 3- 3-   Supination       Pronation       Wrist flexion 3 3   Wrist extension trace                        0      Grip/Pinch Assessment: No tested due to tone and hand function      Sensation: intact     Tone:  R UE   Elbow:   Wrist flexors: 2, clonus   Digit flexors: 1 , clonus    LUE  Elbow: 1  Wrist flexors: 3, clonus       Digit flexors: 1+, clonus       Hand function: RHD, able to  use universal cuff and built up handle to hold items when placed in hand. Unable to grasp cone, foam, or 1in cube when placed on table but once in R hand can grasp with no functional release of object. Clonus observed when grasping item. can now functionally grasp (with two point pinch) and release 1 inch block without set up with SBA     Patient Education:  Discussed progress towards goals, Discussed/Reviewed treatment plan  Education Point Discipline Responsible Teaching Method  Verbal, Written, Video, or Demonstration Learner  Patient, Family, or Caregiver Date Education Initiated Date Completed   Durable Medical Equipment Physical Therapy  Occupational Therapy           Activities of Daily Living Occupational Therapy Verbal  Patient and caregiver 12/14/22     Bathroom Accessibility Occupational Therapy, Physical Therapy           Upper Extremity Orthotics Occupational Therapy           Independent Living / Chartered loss adjuster Work Engineer, water Health Information Occupational Therapy  Cognition Speech Language Pathology  Occupational Therapy           Safety in Environment  Occupational Therapy, Physical Therapy              Goals:   STG  Pt will be educated in, demonstrate understanding of, and implement AE, compensatory strategies, and activity modifications for eating, writing, and leisure activities- ongoing met leisure and eating, continue writing  [name] caregiver will demonstrate understanding of stretching and neuroinhibitive strategies to prevent hypertonicity in bilateral UE- met     LTG  Pt will improve functional use of hand as evidenced by being able to functionally grasp and release 5 blocks with R hand met  Pt will be able to self-feed with MI using AE, compensatory strategies, and activity modifications- ongoing  Pt will be able to perform handwriting with mod A using AE, compensatory strategies, and activity  modifications ongoing  Pt will be able to perform gaming with min A with adaptations as needed- discontinue, given contact information assistive technology specialist    Assessment:     Plan: Continue OT    Clearnce Sorrel, OT  Minutes:  Total time: 60  Timed mins: 60   Untimed: 0   Unbillable: 0

## 2023-03-29 ENCOUNTER — Telehealth: Payer: Self-pay

## 2023-03-29 NOTE — Telephone Encounter (Signed)
NURSE CALLED FOR PATIENT REQUESTING CALL FROM PROVIDER ABOUT A SPLINT

## 2023-04-02 NOTE — Anesthesia Preprocedure Evaluation (Addendum)
Anesthesia Pre-operative History and Physical for [name]  .  Marland Kitchen  CPM/PAT Summary:  Chart review only: Flagged by RN screener [id number] trach/ complex medical history/ TBI- seizures-quad- chronic respiratory failure    I have reviewed the RN anesthesia screening information and erecord for CPM.  Maijor Gines is a 32 y.o. male who is scheduled for extraction of third molar on 04/04/2023 at [facility] main OR by Dr Bishop Dublin.  He has a past medical history of TBI, Seizure disorder, Spastic quadriplegia, Chronic respiratory failure, Tracheostomy, Ventilator dependence, Asthma, Diabetes.    Most recent notes reviewed in ercecord (Neuro, PCP, Complex Care).      Per RN screen: The patient has no personal or family issues with anesthesia.      Discussed with Dr Sallee Lange.  No additional cardiopulmonary testing or in person anesthesia evaluation is indicated for this procedure.  Anesthesia communication placed for TBI, trach, vent.        This patient, upon review and physical exam, is deemed most appropriate to have their surgical procedure performed at a setting where admission is possible.CPM Chart Review onlyBy Darrick Meigs, PA at 10:43 AM on 04/02/2023    ROS/MED HX Not Completed  Physical Exam Not Completed________________________________________________________________________  Ermalinda Memos Plan  Anesthesia Consent Not Performed
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
## 2023-04-03 NOTE — Invasive Procedure Plan of Care (Signed)
CONSENT FOR MEDICAL  OR SURGICAL PROCEDURE                            Patient Name: Angel Wells  [facility] [address]                                                              DOB: [birth year]         Please read this form or have someone read it to you.   It's important to understand all parts of this form. If something isn't clear, ask [place] to explain.   When you sign it, that means you understand the form and give [place] permission to do this surgery or procedure.     I agree for Roselind Rily, DDS , and [facility] Residents along with any assistants* they may choose, to treat the following condition(s): Impacted teeth, nonrestorable teeth     By doing this surgery or procedure on me: - Extraction of teeth #1,16,17,32  - Removal of the following teeth:   upper right and upper left third molars (1,16)  lower left and lower right third molars (17,32)  - Bone smoothing as needed  - Any other indicated procedures  Dental radiographs as necessary    This is also known as: - Extraction of teeth #1,16,17,32  - Alveoloplasty as needed  - Any other indicated procedures  -Dental radiographs as necessary      Laterality: Bilateral     *if you'd like a list of the assistants, please ask. We can give that to you.    1. The care provider has explained my condition to me. They have told me how the procedure can help me. They have told me about other ways of treating my condition. I understand the care provider cannot guarantee the result of the procedure. If I don't have this procedure, my other choices are: Continued monitoring, No treatment.      2. The care provider has told me the risks (problems that can happen) of the procedure. I understand there may be unwanted results. The risks that are related to this procedure include: Pain, bleeding, swelling, infection, dry socket, bony spicule, damage to adjacent teeth or structures, retained or displaced tooth fragments, sinus communication, TMJ pain, trismus, nerve injury with  temporary or permanent numbness to chin, lip and/or tongue with/without loss of some taste, mandibular and/or tuberosity fracture. Risks of anesthesia.      3. I understand that during the procedure, my care provider may find a condition that we didn't know about before the treatment started. Therefore, I agree that my care provider can perform any other treatment which they think is necessary and available.    4. I give permission to the hospital and/or its departments to examine and keep tissue, blood, body parts, fluids or materials removed from my body during the procedure(s) to aid in diagnosis and treatment, after which they may be used for scientific research or teaching by appropriate persons. If these materials are used for science or teaching, my identity will be protected. I will no longer own or have any rights to these materials regardless of how they may be used.    5. My care provider might want a representative  from a medical device company to be there during my procedure. I understand that person works for:          The ways they might help my care provider during my procedure include:            6. Here are my decisions about receiving blood, blood products, or tissues. I understand my decisions cover the time before, during and after my procedure, my treatment, and my time in the hospital. After my procedure, if my condition changes a lot, my care provider will talk with me again about receiving blood or blood products. At that time, my care provider might need me to review and sign another consent form, about getting or refusing blood.    I understand that the blood is from the community blood supply. Volunteers donated the blood, the volunteers were screened for health problems. The blood was examined with very sensitive and accurate tests to look for hepatitis, HIV/AIDS, and other diseases. Before I receive blood, it is tested again to make sure it is the correct type.    My chances of getting  a sickness from blood products are small. But no transfusion is 100% safe. I understand that my care provider feels the good I will receive from the blood is greater than the chances of something going wrong. My care provider has answered my questions about blood products.      My decision  about blood or  blood products   Yes        My decision   about tissue  Implants     N/A          I understand this  form.    My care provider  or his/her  assistants have  explained:   What I am having done and why I need it.  What other choices I can make instead of having this done.  The benefits and possible risks (problems) to me of having this done.  The benefits and possible risks (problems) to me of receiving transplants, blood, or blood products.  There is no guarantee of the results.  The care provider may not stay with me the entire time that I am in the operating or procedure room.  My provider has explained how this may affect my procedure. My provider has answered my  questions about this.         I give my  permission for  this surgery or  procedure.            _______________________________________________                                     My signature  (or parent or other person authorized to sign for you, if you are unable to sign for  yourself or if you are under 67 years old)        ______           Date        _____        Time   Electronic Signatures will display at the bottom of the consent form.    Care provider's statement: I have discussed the planned procedure, including the possibility for transfusion of blood  products or receipt of tissue as necessary; expected benefits; the possible complications and risks; and possible alternatives  and their benefits and risks with the patients or  the patient's surrogate. In my opinion, the patient or the patient's surrogate  understands the proposed procedure, its risks, benefits and alternatives.              Electronically signed by: Roselind Rily,  DDS                                                04/04/2023         Date        8:09 AM        Time

## 2023-04-04 ENCOUNTER — Encounter
Admission: RE | Disposition: A | Discharge status: Home / Self Care | Payer: Self-pay | Source: Ambulatory Visit | Attending: Dental General Practice

## 2023-04-04 ENCOUNTER — Other Ambulatory Visit: Payer: Self-pay

## 2023-04-04 ENCOUNTER — Ambulatory Visit
Admission: RE | Admit: 2023-04-04 | Discharge: 2023-04-04 | Disposition: A | Discharge status: Home / Self Care | Payer: Medicare Other | Source: Ambulatory Visit | Attending: Dental General Practice | Admitting: Dental General Practice

## 2023-04-04 ENCOUNTER — Other Ambulatory Visit: Payer: Self-pay | Admitting: Student in an Organized Health Care Education/Training Program

## 2023-04-04 ENCOUNTER — Ambulatory Visit: Payer: Self-pay | Admitting: Anesthesiology

## 2023-04-04 DIAGNOSIS — E669 Obesity, unspecified: Secondary | ICD-10-CM | POA: Insufficient documentation

## 2023-04-04 DIAGNOSIS — K0263 Dental caries on smooth surface penetrating into pulp: Secondary | ICD-10-CM | POA: Insufficient documentation

## 2023-04-04 DIAGNOSIS — Z87820 Personal history of traumatic brain injury: Secondary | ICD-10-CM | POA: Insufficient documentation

## 2023-04-04 DIAGNOSIS — Z6832 Body mass index (BMI) 32.0-32.9, adult: Secondary | ICD-10-CM | POA: Insufficient documentation

## 2023-04-04 DIAGNOSIS — K011 Impacted teeth: Secondary | ICD-10-CM | POA: Insufficient documentation

## 2023-04-04 DIAGNOSIS — J4540 Moderate persistent asthma, uncomplicated: Secondary | ICD-10-CM | POA: Insufficient documentation

## 2023-04-04 DIAGNOSIS — Z9911 Dependence on respirator [ventilator] status: Secondary | ICD-10-CM | POA: Insufficient documentation

## 2023-04-04 DIAGNOSIS — E119 Type 2 diabetes mellitus without complications: Secondary | ICD-10-CM | POA: Insufficient documentation

## 2023-04-04 HISTORY — PX: PR UNLISTED PROCEDURE DENTOALVEOLAR STRUCTURES: 41899

## 2023-04-04 LAB — POCT GLUCOSE
Glucose POCT: 98 mg/dL (ref 60–99)
Glucose POCT: 126 mg/dL — ABNORMAL HIGH (ref 60–99)

## 2023-04-04 SURGERY — EXTRACTION, TOOTH, THIRD MOLAR
Anesthesia: General | Site: Mouth | Laterality: Bilateral | Wound class: Clean Contaminated

## 2023-04-04 MED ORDER — ONDANSETRON HCL 2 MG/ML IV SOLN *I*
INTRAMUSCULAR | Status: DC | PRN
Start: 2023-04-04 — End: 2023-04-04
  Administered 2023-04-04: 4 mg via INTRAVENOUS

## 2023-04-04 MED ORDER — CHLORHEXIDINE GLUCONATE 0.12 % MT SOLN *I*
OROMUCOSAL | Status: AC
Start: 2023-04-04 — End: 2023-04-04
  Filled 2023-04-04: qty 15

## 2023-04-04 MED ORDER — OXYCODONE HCL 5 MG/5ML PO SOLN *I*
10.0000 mg | Freq: Once | ORAL | Status: DC | PRN
Start: 2023-04-04 — End: 2023-04-04

## 2023-04-04 MED ORDER — LIDOCAINE-EPINEPHRINE (PF) 2 %-1:200000 IJ SOLN *I*
INTRAMUSCULAR | Status: AC
Start: 2023-04-04 — End: 2023-04-04
  Filled 2023-04-04: qty 30

## 2023-04-04 MED ORDER — ACETAMINOPHEN 160 MG/5 ML BULK *I*
500.0000 mg | Freq: Four times a day (QID) | 0 refills | Status: AC
Start: 2023-04-04 — End: 2023-04-09
  Filled 2023-04-04: qty 312.6, 5d supply, fill #0

## 2023-04-04 MED ORDER — DEXAMETHASONE SODIUM PHOSPHATE 4 MG/ML INJ SOLN *WRAPPED*
INTRAMUSCULAR | Status: AC
Start: 2023-04-04 — End: 2023-04-04
  Filled 2023-04-04: qty 1

## 2023-04-04 MED ORDER — ONDANSETRON HCL 2 MG/ML IV SOLN *I*
4.0000 mg | Freq: Three times a day (TID) | INTRAMUSCULAR | Status: DC | PRN
Start: 2023-04-04 — End: 2023-04-04

## 2023-04-04 MED ORDER — FENTANYL CITRATE 50 MCG/ML IJ SOLN *WRAPPED*
INTRAMUSCULAR | Status: DC | PRN
Start: 2023-04-04 — End: 2023-04-04
  Administered 2023-04-04 (×2): 25 ug via INTRAVENOUS
  Administered 2023-04-04: 50 ug via INTRAVENOUS

## 2023-04-04 MED ORDER — FENTANYL CITRATE 50 MCG/ML IJ SOLN *WRAPPED*
INTRAMUSCULAR | Status: AC
Start: 2023-04-04 — End: 2023-04-04
  Filled 2023-04-04: qty 2

## 2023-04-04 MED ORDER — OXYCODONE HCL 5 MG/5ML PO SOLN *I*
5.0000 mg | Freq: Once | ORAL | Status: DC | PRN
Start: 2023-04-04 — End: 2023-04-04

## 2023-04-04 MED ORDER — ACETAMINOPHEN 650 MG/20.3 ML WRAPPED *I*
650.0000 mg | Freq: Four times a day (QID) | Status: DC
Start: 2023-04-04 — End: 2023-04-04
  Filled 2023-04-04 (×3): qty 20.3

## 2023-04-04 MED ORDER — OXYCODONE HCL 5 MG/5ML PO SOLN *I*
5.0000 mg | Freq: Four times a day (QID) | ORAL | 0 refills | Status: AC
Start: 2023-04-04 — End: 2023-04-05
  Filled 2023-04-04: qty 20, 1d supply, fill #0

## 2023-04-04 MED ORDER — ONDANSETRON HCL 2 MG/ML IV SOLN *I*
INTRAMUSCULAR | Status: AC
Start: 2023-04-04 — End: 2023-04-04
  Filled 2023-04-04: qty 2

## 2023-04-04 MED ORDER — IBUPROFEN 20 MG/ML PO SUSP *I*
600.0000 mg | Freq: Four times a day (QID) | ORAL | 0 refills | Status: AC
Start: 2023-04-04 — End: 2023-04-11
  Filled 2023-04-04: qty 840, 7d supply, fill #0

## 2023-04-04 MED ORDER — CLINDAMYCIN PHOSPHATE IN D5W 900 MG/50ML IV SOLN *I*
INTRAVENOUS | Status: AC
Start: 2023-04-04 — End: 2023-04-04
  Filled 2023-04-04: qty 50

## 2023-04-04 MED ORDER — OXYCODONE HCL 5 MG/5ML PO SOLN *I*
5.0000 mg | ORAL | Status: DC | PRN
Start: 2023-04-04 — End: 2023-04-04

## 2023-04-04 MED ORDER — OXYCODONE HCL 5 MG/5ML PO SOLN *I*
10.0000 mg | ORAL | Status: DC | PRN
Start: 2023-04-04 — End: 2023-04-04

## 2023-04-04 MED ORDER — DEXAMETHASONE SODIUM PHOSPHATE 4 MG/ML INJ SOLN *WRAPPED*
INTRAMUSCULAR | Status: DC | PRN
Start: 2023-04-04 — End: 2023-04-04
  Administered 2023-04-04: 4 mg via INTRAVENOUS

## 2023-04-04 MED ORDER — LACTATED RINGERS IV SOLN *I*
140.0000 mL/h | INTRAVENOUS | Status: DC
Start: 2023-04-04 — End: 2023-04-04
  Administered 2023-04-04: 140 mL/h via INTRAVENOUS

## 2023-04-04 MED ORDER — LIDOCAINE HCL 1% IJ SOLN *WRAPPED* (FOR LINE PLACEMENT ONLY)
10.0000 mL | INTRAMUSCULAR | Status: DC | PRN
Start: 2023-04-04 — End: 2023-04-04

## 2023-04-04 MED ORDER — SODIUM CHLORIDE 0.9 % INJ (FLUSH) WRAPPED *I*
Status: AC
Start: 2023-04-04 — End: 2023-04-04
  Filled 2023-04-04: qty 10

## 2023-04-04 MED ORDER — FENTANYL CITRATE 50 MCG/ML IJ SOLN *WRAPPED*
25.0000 ug | INTRAMUSCULAR | Status: DC | PRN
Start: 2023-04-04 — End: 2023-04-04
  Administered 2023-04-04 (×2): 25 ug via INTRAVENOUS

## 2023-04-04 MED ORDER — LIDOCAINE HCL (PF) 1 % IJ SOLN *I*
INTRAMUSCULAR | Status: AC
Start: 2023-04-04 — End: 2023-04-04
  Filled 2023-04-04: qty 2

## 2023-04-04 MED ORDER — MIDAZOLAM HCL 1 MG/ML IJ SOLN *I* WRAPPED
INTRAMUSCULAR | Status: DC | PRN
Start: 2023-04-04 — End: 2023-04-04
  Administered 2023-04-04: 2 mg via INTRAVENOUS

## 2023-04-04 MED ORDER — CLINDAMYCIN PHOSPHATE 150 MG/ML INJ SOLN *WRAPPED*
INTRAMUSCULAR | Status: AC
Start: 2023-04-04 — End: 2023-04-04
  Filled 2023-04-04: qty 6

## 2023-04-04 MED ORDER — LIDOCAINE HCL 1% IJ SOLN *WRAPPED* (FOR LINE PLACEMENT ONLY)
0.1000 mL | INTRAMUSCULAR | Status: DC | PRN
Start: 2023-04-04 — End: 2023-04-04

## 2023-04-04 MED ORDER — MIDAZOLAM HCL 1 MG/ML IJ SOLN *I* WRAPPED
INTRAMUSCULAR | Status: AC
Start: 2023-04-04 — End: 2023-04-04
  Filled 2023-04-04: qty 2

## 2023-04-04 MED ORDER — PROPOFOL 10 MG/ML IV EMUL (INTERMITTENT DOSING) WRAPPED *I*
INTRAVENOUS | Status: AC
Start: 2023-04-04 — End: 2023-04-04
  Filled 2023-04-04: qty 20

## 2023-04-04 MED ORDER — CHLORHEXIDINE GLUCONATE 0.12 % MT SOLN *I*
OROMUCOSAL | Status: DC | PRN
Start: 2023-04-04 — End: 2023-04-04
  Administered 2023-04-04: 15 mL via OROMUCOSAL

## 2023-04-04 MED ORDER — IBUPROFEN 20 MG/ML PO SUSP *I*
800.0000 mg | Freq: Three times a day (TID) | ORAL | Status: DC
Start: 2023-04-04 — End: 2023-04-04

## 2023-04-04 MED ORDER — LIDOCAINE-EPINEPHRINE (PF) 2 %-1:200000 IJ SOLN *I*
INTRAMUSCULAR | Status: DC | PRN
Start: 2023-04-04 — End: 2023-04-04
  Administered 2023-04-04: 10 mL via SUBCUTANEOUS

## 2023-04-04 MED ORDER — SODIUM CHLORIDE 0.9 % FLUSH FOR PUMPS *I*
0.0000 mL/h | INTRAVENOUS | Status: DC | PRN
Start: 2023-04-04 — End: 2023-04-04

## 2023-04-04 MED ORDER — PHENYLEPHRINE 100 MCG/ML IN NS 10 ML *WRAPPED*
INTRAMUSCULAR | Status: DC | PRN
Start: 2023-04-04 — End: 2023-04-04
  Administered 2023-04-04 (×2): 100 ug via INTRAVENOUS

## 2023-04-04 MED ORDER — ONDANSETRON HCL 2 MG/ML IV SOLN *I*
4.0000 mg | Freq: Once | INTRAMUSCULAR | Status: DC | PRN
Start: 2023-04-04 — End: 2023-04-04

## 2023-04-04 MED ORDER — PROPOFOL 10 MG/ML IV EMUL (INTERMITTENT DOSING) WRAPPED *I*
INTRAVENOUS | Status: DC | PRN
Start: 2023-04-04 — End: 2023-04-04
  Administered 2023-04-04: 50 mg via INTRAVENOUS

## 2023-04-04 MED ORDER — CHLORHEXIDINE GLUCONATE 0.12 % MT SOLN *I*
15.0000 mL | Freq: Once | OROMUCOSAL | Status: DC
Start: 2023-04-04 — End: 2023-04-04

## 2023-04-04 MED ORDER — SODIUM CHLORIDE 0.9 % IR SOLN *I*
20.0000 mL | Freq: Three times a day (TID) | 3 refills | Status: AC
Start: 2023-04-04 — End: 2023-04-11

## 2023-04-04 MED ORDER — CLINDAMYCIN PHOSPHATE IN D5W 900 MG/50ML IV SOLN *I*
INTRAVENOUS | Status: DC | PRN
Start: 2023-04-04 — End: 2023-04-04
  Administered 2023-04-04: 900 mg via INTRAVENOUS

## 2023-04-04 SURGICAL SUPPLY — 17 items
BUR SURGICAL M HEAD DIA2.1MM CROSS CUT FISSURE ROUND TAPERED NONFLUTED ELITE TPS (Supply) ×1 IMPLANT
COVER LIGHT HANDLE SURG RIGID (Supply) IMPLANT
FILTER NEPTUNE 4PORT MANIFOLD (Supply) ×1 IMPLANT
GLOVE SURG BIOGEL PI ULTRATOUCH SZ 7.0 (Glove) ×1 IMPLANT
PACK CUSTOM OMFS MINOR (Pack) ×1 IMPLANT
PACK TOWEL LIGHT BLUE STERILE (Pack) ×1 IMPLANT
PAD GROUNDING ADULT SURE FIT (Supply) ×1 IMPLANT
PEN SURG MARKER REG TIP (Supply) ×1 IMPLANT
PROTECTOR ULNA NERVE ~~LOC~~ (Supply) ×3 IMPLANT
SLEEVE COMP KNEE HI MED (Supply) ×1 IMPLANT
SOL H2O IRRIG 500ML STERILE BTL (Solution) ×1 IMPLANT
SOL SODIUM CHLORIDE IRRIG 1000ML BTL (Solution) ×1 IMPLANT
SPIKE MINI DISPENSING PIN (BBRAUN DP1000) (Supply) ×1 IMPLANT
SPONGE SURGIFOAM GELATIN 12-7 (Sponge) ×1 IMPLANT
STAPLER SKIN WIDE STPL LEG L3.9MM WIRE DIA0.58MM FIX HD PROX (Supply) ×1 IMPLANT
SUTR CHROMIC GUT 3-0 SH 27IN (Suture) ×1 IMPLANT
SYRINGE LUERLOCK 30ML INDIVIDUAL WRAP (Syringe) ×1 IMPLANT

## 2023-04-04 NOTE — Discharge Instructions (Signed)
WISDOM TEETH EXTRACTIONS DISCHARGE INSTRUCTIONS  After a sedation procedure DO NOT drive, operate heavy machinery, drink alcoholic beverages, make important personal or business decisions, and/or sign legal documents for 24 hours.    First 30 minutes after procedure:  Bite on gauze to help stop bleeding as continued pressure is desired. If bleeding continues after 30 minutes, bite on fresh gauze as follows:  moisten the gauze then fold twice. Place gauze on extraction site(s) for 30 minutes without talking. Repeat as needed.   A small amount of blood tinged saliva is expected during the first 24 hours.    Avoid physical activity for 72 hours after your procedure.  No bending, lifting, strenuous activity, sports or gym-class.  After, you may resume normal activity slowly as tolerated.      NO BRUSHING for the first 24 hours. Begin tooth brushing the day after surgery. Brush gently around the extraction site(s).   The day after surgery, begin warm salt water rinses after meals (or 4-5 times per day): 1/2 teaspoon of salt in an 8oz. glass of warm water.    A mouth rinse may be prescribed. If prescribed, use as instructed starting the day after surgery.     DO NOT USE STRAWS for 7 days after surgery.  Straws create suction pressure inside your mouth and could dislodge the blood clots that form in the extraction site(s).      DO NOT SMOKE during the first 21 days after surgery.  Smoking increases the risk of dry socket and other healing complications.     Stitches will dissolve in 7-10 days. If stitches fall out or become loose before 7-10 days, it does not need further attention. If stitches are not dissolvable a follow up visit will be made to remove them.     You may expect:  Mild to moderate pain may occur after the local anesthetic wears off. Avoid aspirin, this may lead to more bleeding.  For moderate to severe pain, take the prescription medication(s) as prescribed for you.  Do NOT operate heavy machinery, drink  alcoholic beverages, or make important decisions while on narcotics.  Limited mouth opening due to swelling and tightness of jaw muscles.  Bruising may develop after surgery.  Bruising should resolve after the first 7-10 days.   Swelling is normal and will increase 48 - 72 hours after surgery.    Apply ice against your cheek(s) for the first 24-48 hours: 20 minutes on 40 minutes off, then repeat.   Keep your head elevated with 2 or 3 pillows when lying down during the first 24 hours.  This will reduce bleeding, swelling and improve your level of comfort.  Earache or TMJ (jaw joint) tenderness may develop. That will slowly resolve over the first week.     In case of uncontrolled bleeding, extreme pain or unusual circumstances, please call the office immediately at [phone].    DIET   Avoid eating or drinking anything within the first hour after surgery. Avoid hot liquids and foods for the first 24 hours.    During the first week, avoid hard, crispy foods such as chips, hard pretzels, and popcorn.   Soft foods are recommended on the day of surgery. Return to a normal diet as tolerated unless otherwise instructed. The following food choices are recommended:  During the first few days: soup broth, well-cooked pasta, eggs, yogurt, cottage cheese, Jell-O, pudding, custard, applesauce, bananas and ice cream.    After the first few days, slowly increase   your diet to include: fish, chicken, meatloaf, mashed potatoes, cooked vegetables, soups and cereals.

## 2023-04-04 NOTE — Op Note (Signed)
Operative Note (Surgical Case/Log ID: [id number])       Date of Surgery: 04/04/2023       Surgeons: Surgeons and Role:     * Vorrasi, Esmeralda Arthur, DDS - Primary     * Sherlyn Ebbert, Earna Coder, DMD - Resident - Assisting     * Bibat, Jeanette Caprice, DMD - Resident - Assisting   Assistants:         Pre-op Diagnosis: Pre-Op Diagnosis Codes:      * Dental caries on smooth surface penetrating into pulp [K02.63]     * Impacted tooth [K01.1]       Post-op Diagnosis: Post-Op Diagnosis Codes:     * Dental caries on smooth surface penetrating into pulp [K02.63]     * Impacted tooth [K01.1]       Procedure(s) Performed: Procedure(s) (LRB):  EXTRACTION, TOOTH, THIRD MOLAR (Bilateral)       Anesthesia Type: General        Fluid Totals: I/O this shift:  09/25 0700 - 09/25 1459  In: 650 (7.9 mL/kg) [I.V.:600; IV Piggyback:50]  Out: 0 (0 mL/kg)   Net: 650  Weight: 82.1 kg        Estimated Blood Loss: 15 mL       Specimens to Pathology:  * No specimens in log *       Temporary Implants: None       Packing:  None               Patient Condition: good       Indications: Teeth #1,16,17,32 are impacted with no room for normal eruption and function, are a source of discomfort due to partial eruption of teeth #1,16 and 32, can potentially compromise periodontal health of adjacent teeth and indicated for extraction. Due to the patient's limited ability to cooperate in the outpatient dental care setting, this is best managed in the OR under GA.        Findings (Including unexpected complications): Soft tissue impacted teeth #1 and 16, full bony impacted teeth #17 and 32. Extraction of teeth #1,16,17,32 performed under GA.      Description of Procedure:     Surgeon: Dr. Bishop Dublin    Assistants: Loney Hering, DMD    Anesthesia: General endotracheal anesthesia    ASA Class: 3    On the day of the procedure, the patient and family were met in the preanesthesia area. Risks and benefits of the procedure were discussed and written consent was obtained. The  history and physical was also updated. The patient was then transferred to the operating room and placed in a supine position on the OR table. General anesthesia was induced via inhalational anesthesia with patient's existing tracheotomy for airway management.  The patient was prepped and draped in the normal sterile fashion. A timeout was held with all parties agreeing and a bite block was placed. The oropharynx was then examined and suctioned, and a throat pack was placed. 2% lidocaine with 1:100,000 epinephrine was injected via blocks and infiltrations for a total of 10cc.     Attention was then directed intraorally where a 15 blade and periosteal elevator were used to reflect a full thickness mucoperiosteal flap to access surgical sites at #17 and 32. A high speed surgical handpiece under copious irrigation was used to create a buccal trough and section teeth #17 and 32 as required. Soft tissue released at sites #1 and 16 with periosteal elevator.  Teeth [phone] were then mobilized with dental elevators  and delivered with forceps. The sockets were curetted. A rongeur and hand bone file were used to smooth and trim excess bone until smooth. The sockets and mucoperiosteal flaps were irrigated with copious saline. Gel foam placed at sites #1 and 32 and soft tissue was then closed using 3-0 chromic gut sutures.     The patient's mouth was then rinsed copiously again with saline to ensure no loose or remaining debris in the mouth. The throat pack was removed. The bite block was removed. The patient was turned over to the anesthesia team who awoke and extubated the patient with no complications, and the patient was transferred to the PACU in stable condition.    Dr. Lenon Ahmadi, DDS was present for all key portions of the case.              Signed:  Loney Hering, DMD  on 04/04/2023 at 11:00 AM

## 2023-04-04 NOTE — Progress Notes (Signed)
UPDATES TO PATIENT'S CONDITION on the DAY OF SURGERY/PROCEDURE    I. Updates to Patient's Condition (to be completed by a provider privileged to complete a H&P, following reassessment of the patient by the provider):    Day of Surgery/Procedure Update:  History  History reviewed and no change    Physical  Physical exam updated and no change            II. Procedure Readiness   I have reviewed the patient's H&P and updated condition. By completing and signing this form, I attest that this patient is ready for surgery/procedure.    III. Attestation   I have reviewed the updated information regarding the patient's condition and it is appropriate to proceed with the planned surgery/procedure.      Roselind Rily, DDS as of 8:09 AM 04/04/2023

## 2023-04-04 NOTE — Anesthesia Case Conclusion (Signed)
CASE CONCLUSION  Emergence  Actions:  Suctioned and extubated  Criteria Used for Airway Removal:  Adequate Tv & RR and acceptable O2 saturation  Assessment:  Routine  Transport  Directly to: PACU  Airway:  Trach  Oxygen Delivery:  6 lpm  Position:  Supine  Patient Condition on Handoff  Level of Consciousness:  Mildly sedated  Patient Condition:  Stable  Handoff Report to:  RN

## 2023-04-04 NOTE — Anesthesia Postprocedure Evaluation (Signed)
Anesthesia Post-Op Note    Patient: Angel Wells    Procedure(s) Performed:  Procedure Summary  Date:  04/04/2023 Anesthesia Start: 04/04/2023  9:03 AM Anesthesia Stop:  Room / Location:  [id number] / [facility] MAIN OR   Procedure(s):  EXTRACTION, TOOTH, THIRD MOLAR Diagnosis:  Dental caries on smooth surface penetrating into pulp [K02.63]  Impacted tooth [K01.1] Surgeon(s):  Vorrasi, Esmeralda Arthur, DDS  Loney Hering, DMD  Bibat, Jeanette Caprice, DMD Responsible Anesthesia Provider:  Lilian Kapur, MD         Recovery Vitals  BP: 112/68 (04/04/2023  7:59 AM)  Heart Rate: 58 (04/04/2023  7:59 AM)  Resp: 18 (04/04/2023  7:59 AM)  Temp: 36.7 C (98.1 F) (04/04/2023  7:59 AM)  SpO2: 95 % (04/04/2023  7:59 AM)    Anesthesia type:  general  Complications Noted During Procedure or in PACU:  No value filed.   Comment: No value filed.  Patient Location:  PACU  Level of Consciousness:    Sedated  Patient Participation:     Able to participate  Temperature Status:    Normothermic  Oxygen Saturation:    Within patient's normal range  Cardiac Status:   within patient's normal range  Fluid Status:    Stable  Airway Patency:     Yes  Pulmonary Status:    Baseline  Neuraxial Block Evaluation:    No residual motor or sensory symptoms  Pain Management:    Adequate analgesia  Nausea and Vomiting:  None    Post Op Assessment:    Tolerated procedure well  Responsible Anesthesia Provider Attestation:  All indicated post anesthesia care provided   -

## 2023-04-04 NOTE — Anesthesia Procedure Notes (Signed)
---------------------------------------------------------------------------------------------------------------------------------------    AIRWAY   GENERAL INFORMATION AND STAFF    Patient location during procedure: OR       Date of Procedure: 04/04/2023 9:33 AM  CONDITION PRIOR TO MANIPULATION     Current Airway/Neck Condition:  Normal        For more airway physical exam details, see Anesthesia PreOp Evaluation  AIRWAY METHOD     Patient Position:  Sniffing    Preoxygenated: yes      Maintained In-Line Stability: not needed, normal c-spine condition          To see details of medications used, see MAR    Induction: IV    Mask Difficulty Assessment:  0 - not attempted    Number of Attempts at Approach:  1    Number of Other Approaches Attempted:  0  FINAL AIRWAY DETAILS    Final Airway Type:  Trach device  ----------------------------------------------------------------------------------------------------------------------------------------

## 2023-04-04 NOTE — INTERIM OP NOTE (Cosign Needed)
Interim Op Note (Surgical Log ID: [id number])       Date of Surgery: 04/04/2023       Surgeons: Surgeons and Role:     * Vorrasi, Esmeralda Arthur, DDS - Primary     * Costantino Kohlbeck, Earna Coder, DMD - Resident - Assisting     * Bibat, Jeanette Caprice, DMD - Resident - Assisting   Assistants:         Pre-op Diagnosis: Pre-Op Diagnosis Codes:      * Dental caries on smooth surface penetrating into pulp [K02.63]     * Impacted tooth [K01.1]       Post-op Diagnosis: Post-Op Diagnosis Codes:     * Dental caries on smooth surface penetrating into pulp [K02.63]     * Impacted tooth [K01.1]       Procedure(s) Performed: Procedure(s) (LRB):  Extraction of teeth #1,16,17,32       Anesthesia Type: General        Fluid Totals: I/O this shift:  09/25 0700 - 09/25 1459  In: 650 (7.9 mL/kg) [I.V.:600; IV Piggyback:50]  Out: 0 (0 mL/kg)   Net: 650  Weight: 82.1 kg        Estimated Blood Loss: 0 mL       Specimens to Pathology:  None       Temporary Implants: None       Packing:  None               Patient Condition: good       Findings (Including unexpected complications): Soft tissue impacted teeth #1 and 16, full bony impacted teeth #17 and 32.  Extraction of teeth #1,16,17,32 performed under GA.     Signed:  Loney Hering, DMD  on 04/04/2023 at 10:59 AM

## 2023-04-09 ENCOUNTER — Encounter: Payer: Self-pay | Admitting: Dental General Practice

## 2023-04-10 ENCOUNTER — Telehealth: Payer: Self-pay

## 2023-04-10 NOTE — Telephone Encounter (Signed)
Patient's nurse has a question about medications.  Please follow up with her

## 2023-04-10 NOTE — Telephone Encounter (Signed)
Called patient's nurse back. She asked if the patient can begin using home chlorhexidine (he is post procedure, had wisdom teeth extracted on 9/25 in the OR per [name]). RN checked with OMFS provider team, but especially if patient uses this medication at baseline, there is no contraindication to using chlorhexidine. [name] verbalized understanding and thanked Clinical research associate for call.    Maggie, RN

## 2023-04-11 ENCOUNTER — Other Ambulatory Visit: Payer: Self-pay | Admitting: Oncology

## 2023-04-12 ENCOUNTER — Other Ambulatory Visit: Payer: Self-pay | Admitting: Student in an Organized Health Care Education/Training Program

## 2023-04-12 ENCOUNTER — Other Ambulatory Visit: Payer: Self-pay | Admitting: Oncology

## 2023-04-12 DIAGNOSIS — Z8701 Personal history of pneumonia (recurrent): Secondary | ICD-10-CM

## 2023-04-12 DIAGNOSIS — J4540 Moderate persistent asthma, uncomplicated: Secondary | ICD-10-CM

## 2023-04-12 DIAGNOSIS — J45909 Unspecified asthma, uncomplicated: Secondary | ICD-10-CM

## 2023-04-17 ENCOUNTER — Other Ambulatory Visit: Payer: Self-pay | Admitting: Student in an Organized Health Care Education/Training Program

## 2023-04-17 DIAGNOSIS — Z794 Long term (current) use of insulin: Secondary | ICD-10-CM

## 2023-04-18 ENCOUNTER — Other Ambulatory Visit: Payer: Self-pay | Admitting: Oncology

## 2023-04-19 ENCOUNTER — Other Ambulatory Visit: Payer: Self-pay | Admitting: Primary Care

## 2023-04-20 ENCOUNTER — Other Ambulatory Visit: Payer: Self-pay | Admitting: Student in an Organized Health Care Education/Training Program

## 2023-04-20 ENCOUNTER — Ambulatory Visit
Payer: Medicare Other | Attending: Student in an Organized Health Care Education/Training Program | Admitting: Student in an Organized Health Care Education/Training Program

## 2023-04-20 ENCOUNTER — Other Ambulatory Visit: Payer: Self-pay

## 2023-04-20 ENCOUNTER — Encounter: Payer: Self-pay | Admitting: Student in an Organized Health Care Education/Training Program

## 2023-04-20 VITALS — BP 117/55 | HR 83 | Temp 97.0°F | Ht 63.0 in | Wt 176.2 lb

## 2023-04-20 DIAGNOSIS — Z0000 Encounter for general adult medical examination without abnormal findings: Secondary | ICD-10-CM | POA: Insufficient documentation

## 2023-04-20 DIAGNOSIS — E119 Type 2 diabetes mellitus without complications: Secondary | ICD-10-CM | POA: Insufficient documentation

## 2023-04-20 MED ORDER — CANAGLIFLOZIN 100 MG PO TABS *A*
300.0000 mg | ORAL_TABLET | Freq: Every day | ORAL | 0 refills | Status: DC
Start: 2023-04-20 — End: 2023-10-04

## 2023-04-20 MED ORDER — CALCIUM CARBONATE ANTACID 1000 MG PO CHEW *I*
1000.0000 mg | CHEWABLE_TABLET | Freq: Every day | ORAL | 3 refills | Status: DC
Start: 2023-04-20 — End: 2023-09-21

## 2023-04-20 MED ORDER — TOUJEO MAX SOLOSTAR 300 UNIT/ML SC SOPN
50.0000 [IU] | PEN_INJECTOR | Freq: Every evening | SUBCUTANEOUS | 0 refills | Status: DC
Start: 2023-04-20 — End: 2023-05-03

## 2023-04-20 NOTE — Patient Instructions (Signed)
Thank you for completing your Follow-up (Was on tobramycin insurance denied it, /Prescitopn for tums to danwin/Redness around g tube/) and Initial Annual Medicare visit   with [place] today.     The purpose of this visits was to:    Screen for disease  Assess risk of future medical problems  Help develop a healthy lifestyle  Update vaccines  Get to know your doctor in case of an illness    Patient Care Team:  Zollie Scale, DO as PCP - General (Internal Medicine)  Zollie Scale, DO as PCP - CCP-COMPLEX CARE (Internal Medicine)  Konrad Dolores as Care Manager  Leta Speller, RRT as Respiratory Therapist (Pediatrics)     Medicare 5 Year Plan    The following items were identified as areas of concern during your screening today:  BMI greater than 25 - This is a risk for Heart Attack, Stroke, High Blood Pressure, Diabetes, High Cholesterol and other complications.       The Health Maintenance table below identifies screening tests and immunizations recommended by your health care team:  Health Maintenance: These screening recommendations are based on [place]STF, [employer], and [place] state guidelines   Topic Date Due   . HIV Screening  Never done   . Hepatitis B Vaccine (1 of 3 - 19+ 3-dose series) Never done   . Diabetic Foot Exam  07/06/2022   . Diabetic A1C Monitoring  07/05/2023   . Diabetic Kidney Disease Blood  01/03/2024   . Diabetic Kidney Disease Urine  01/05/2024   . Depression Screen Yearly  04/19/2024   . Diabetic Eye Exam  07/12/2024   . DTaP/Tdap/Td Vaccines (2 - Td or Tdap) 07/07/2031   . Flu Shot  Completed   . Hepatitis C Screening  Completed   . Pneumococcal Vaccination  Completed   . COVID-19 Vaccine  Completed   . HIB Vaccine  Aged Out   . HPV Vaccine  Aged Out   . Meningococcal Vaccine  Aged Out   . Rotavirus Vaccine  Aged Out     In addition, goals and orders placed to address these recommendations are listed in the "Today's Visit" section.    We wish you the best of health and look forward to seeing  you again next year for your Annual Medicare Wellness Visit.     If you have any health care concerns before then, please do not hesitate to contact us.
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
## 2023-04-20 NOTE — Progress Notes (Signed)
[facility] - Comprehensive Medication Management Note    Angel Wells is a 32 y.o. male who presents for co-visit with PCP and clinical pharmacist. Patient was referred by PCP for comprehensive medication management in the setting of polypharmacy/medication complexity and Diabetes . Patient's medications, preferred pharmacy and allergies were updated and marked as reviewed, as appropriate.                  Vitals:    04/20/23 1501   BP: 117/55   Pulse: 83   Temp: 36.1 C (97 F)   Weight: 79.9 kg (176 lb 3.2 oz)   Height: 1.6 m (5\' 3" )        Additional labs/vitals as documented in chart - reviewed all relevant objective info at time of visit.    Current Outpatient Medications   Medication Sig Dispense Refill    B-D UF III MINI PEN NEEDLES 31G X 5 MM USE 3 (THREE) TIMES DAILY AS NEEDED. 100 each 5    cetirizine (ZYRTEC) 10 mg tablet Administer 1 tablet (10 mg total) via G tube nightly. 90 tablet 2    EPINEPHrine (EPIPEN) 0.3 mg/0.3 mL auto-injector use as directed 2 each 0    ipratropium (ATROVENT) 0.02 % nebulizer solution INHALE 2.5 MLS BY NEBULIZATION 4 TIMES DAILY AS NEEDED FOR WHEEZING 62.5 mL 5    albuterol (PROVENTIL) (2.5 mg/32mL) 0.083% nebulizer solution INHALE 3 MLS BY NEBULIZATION 4 TIMES DAILY FOR SPASM OF LUNG AIR PASSAGES WITH VESTING. WHEN ILL, USE FOUR TIMES A DAY WITH VESTING. 1050 mL 3    INVOKANA 100 MG tablet take 1 tablet by mouth daily (before breakfast). 90 tablet 0    bisacodyl (DULCOLAX) 10 mg suppository place 1 suppository rectally every other day and as needed if pt has not had a bowel movement in 2 days 30 suppository 5    tobramycin, PF, (TOBI) 300 MG/5ML nebulizer solution Inhale 5 mLs (300 mg total) by nebulization every 12 hours for pseudomonas infection, trach/vented patient. NEBULIZE 1 VIAL (300 MG) TWICE DAILY EVERY OTHER MONTH   DIAGNOSIS: E84.0 280 mL 5    metFORMIN (GLUCOPHAGE) 500 MG/5ML solution administer 10 mls (1,000 milligram total) via g tube 2 times daily  (with meals) 473 mL 5    valproate (VALPROIC ACID) 50 mg/mL oral solution take 5 milliliter by mouth 3 times daily 1350 mL 5    fluticasone (FLONASE) 50 MCG/ACT nasal spray spray 1 spray into nostril daily 16 g 5    chlorhexidine (PERIDEX) 0.12 % solution use as directed 473 mL 5    levETIRAcetam (KEPPRA) 100 mg/mL solution give 15 MILLILITER by g-tube twice daily 473 mL 0    carbamide peroxide (DEBROX) 6.5 % otic solution use 5 drops twice daily on the first 3 days of each month. 15 mL 0    baclofen (LIORESAL) 10 mg tablet take 1 tablet (10 milligram total) by mouth 2 times daily 180 tablet 3    Insulin Glargine, 2 Unit Dial, (TOUJEO MAX SOLOSTAR) 300 UNIT/ML injection pen Inject 64 units into the skin nightly for Type 2 Diabetes. Discard each pen 56 days after first use. 9 each 0    guaifenesin (HUMIBID E) 400 mg TABS Take 1 tablet by mouth 3 times daily. Ok for additional dose prn for respiratory illness. 84 tablet 5    clonazePAM (KLONOPIN) 1 mg tablet take 1 tablet by mouth twice daily as needed (crush for g-tube) for seizures; maximum daily dose 2 tabs 10  tablet 0    generic DME Give 1/2 cup of juice through gtube for BG lower than 70. Repeat BG after 15 minutes. If still below 70, give another 1/2 cup of juice.      blood glucose (ACCU-CHEK GUIDE) test strip USE 1 STRIP 3 TIMES A DAY 100 each 5    nystatin (MYCOSTATIN) ointment apply topically with each diaper change as needed for diaper rash. to the following areas: gtube site after mild cleansing 15 g 5    lacosamide (VIMPAT) 10 mg/mL SOLN oral solution Take 10 MILLILITER per tube twice daily. ; MAXIMUM DAILY DOSE 20 MILLILITER 600 mL 5    Docusate Sodium (SILACE) 150 MG/15ML syrup take 10 mls (100 milligram total) by mouth 3 times daily hold for loose stool 900 mL 5    azelastine (ASTELIN) 0.1 % nasal spray Apply 2 sprays in each nostril every morning and then in the afternoon as needed 30 mL 11    calcium carbonate (TUMS EX) 750 MG chewable tablet Place 2  tablets (1,500 mg total) into mouth, chew and swallow daily. 60 tablet 2    lactobacillus rhamnosus, GG, (CULTURELLE) capsule Take 2 capsules (2 each total) by mouth daily. 30 capsule 5    sodium chloride 0.9 % nebulizer solution Inhale 5 mLs by nebulization 3 times daily 17 mL 0    budesonide (PULMICORT) 0.25 mg/11mL nebulizer suspension INHALE 2 ML (CONTENT OF 1 VIAL) VIA NEBULIZER 2 TIMES DAILY FOR ASTHMA 360 mL 5    nebulizer device with mask and tubing Use as directed 1 kit 0    Lactulose 20 gm/60mL solution administer 30 mls (20 g total) via g tube daily as needed (no stool in 48 hours) use as directed 946 mL 5    GLUCAGON EMERGENCY 1 MG injection kit use as directed as needed for blood glucose < 70 1 each 0    Probiotic Product (PROBIOTIC GUMMIES) 30 MG CHEW Take 30 mg by mouth daily      EPINEPHrine (EPIPEN JR) 0.15 mg/0.3 mL auto-injector Inject 0.3 mLs (0.15 mg total) into the muscle as needed for Anaphylaxis.      bisacodyl (DULCOLAX) 10 mg suppository please give suppository every other day, please check with dad prior to giving 12 each 5    insulin aspart (NOVOLOG FLEXPEN) 100 UNIT/ML injection pen Inject 2-10 units into the skin 3 times daily (before meals)  <70 Hold Insulin, give 15g of carb/sugar to treat hypoglycemia.  70-180 No insulin.  181-260 Give 1 Units  261-340 Give 2 units 341-400 Give 3 Units  >400  Give 4 units 45 each 5    Accu-Chek FastClix Lancets MISC 1 EACH BY MISC.(NON-DRUG COMBO ROUTE) ROUTE 3 (THREE) TIMES DAILY. E11.65 102 each 5    TOBI 300 MG/5ML nebulizer solution Inhale 5 mLs (300 mg total) by nebulization every 12 hours  for pseudomonas infection, trach/vented patient NEBULIZE 1 VIAL (300 MG) TWICE DAILY EVERY OTHER MONTH   DIAGNOSIS: E84.0 280 mL 5    famotidine (PEPCID) 20 mg tablet take 1 tablet by mouth 2 (two) times daily. ok to crush it and put it down tube if necessary (Patient taking differently: Administer 1 tablet (20 mg total) via G tube 2 times daily. TAKE 1 TABLET  BY MOUTH 2 (TWO) TIMES DAILY. OK TO CRUSH IT AND PUT IT DOWN TUBE IF NECESSARY) 180 tablet 5    sodium chloride (OCEAN) 0.65 % nasal spray Spray 1 spray into each nostril 2 times  daily as needed for Congestion 45 mL 5    Adhesive Tape (PAPER TAPE 1"X10YD) TAPE Use as directed. Diagnosis code: G82.50 300 each 5    SENNA 8.6 MG tablet administer 2 tablets via g tube daily 180 tablet 5    nystatin (MYCOSTATIN) powder apply topically 4 times daily  to the following areas: neck, groin, axilla 60 g 0    generic DME Low rate alarms set at 4 breaths, high rate alarm set at 40 breaths, pressure support set at 13 cms 1 each 0    generic DME Ventilator for home use with humidifier 1 each 0    generic DME Item to dispense: Bivona silicone cuffed trach tube size 7 80 mm length  Instructions for use: Use as directed 2 each 5    generic DME Item to dispense: trach care kits  Instructions for use: use as directed 30 each 5    generic DME Item to dispense: trach ties  Instructions for use: use as directed 30 each 5    Gauze Pads & Dressings (SPLIT GAUZE DRAINAGE SPONGE) 4"X4" Use  as instructed. 30 each 5    generic DME Item to dispense: 2X2 gauze 30 each 5    generic DME Item to dispense: stationary pulse oximeter 1 each 0    generic DME Item to dispense: Purple Passey-Muir valve  Instructions for use: Use As Directed 1 each 5    generic DME Vent settings:  SIMV/PC/PS, RATE 10 IP 21. PS 13, PEEP 7, HIGH PRESSURE 50, LOW PRESSURE 5, HIGH RR 40, LOW RR14 1 each 0    generic DME .Cough Assist and related supplies. Use as directed 1 each 0    generic DME Item to dispense: 2 Tracheostomies #7 Bivona Silicone Cuffed 80 mm Length  Instructions for use: use as directed 2 each 6    generic DME Gel Overlay Mattress.  LON: 99, [id number]. Use as directed. G82.50, S06.9x9 1 each 0    generic DME Non-sterile gloves size medium Use as directed. MCAID ID [id number] 2boxes/month 3 each 5    oxygen 1-2 L/min to keep O2 sat above 92%, may increase up to  4 lpm as needed.  Low saturation alarm 85%, high alarm off, low HR 50, High HR 200 1 each 0    Misc. Devices (PULSE OXIMETER) MISC Pulse Oximeter with Supplies, LON: 99, [id number], J96.11, Z99.11 1 each 0    generic DME [place] Percussion Vest Model # 105  55inch  Use as directed. 1 each 0    Cholecalciferol 25 MCG (1000 UT) CHEW Take 1,000 units by mouth daily  2 chews daily      MIC-KEY gastrostomy kit 14FR Please dispense every 3 months and prn, MicKey 14Fr 2.5cm 1 kit 3    triamcinolone (KENALOG) 0.025 % cream Apply topically 2 times daily  APPLY TO AFFECTED AREA 15 g 2    MIC-KEY gastrostomy feeding tube extension set 12" Use as directed with gtube 1 kit 11    MIC-KEY gastrostomy feeding tube extension set 24" Use as directed, please dispense 4 per month 4 kit 5    generic DME PASSEY MUIR VALVE (PURPLE) Use as tolerated.MCAID ID [id number] 1 each 3    diphenhydrAMINE (BENADRYL) 25 MG oral solid Administer 1 each (25 mg total) via G tube nightly as needed for Sleep.      Ascorbic Acid (VITAMIN C ADULT GUMMIES PO) Take 250 mg by mouth daily  Give 2 gummies once daily  insulin pen needle (FIFTY50 PEN NEEDLES) 31G X 8 MM 1 each       No current facility-administered medications for this visit.         Assessment and Plan:  With regards to medication management for this patient, the following aspects were reviewed: Indication, effectiveness, safety and convenience of each medication. The patient's medical conditions and medications were assessed, evaluated, and deemed meeting goals of drug therapy except as listed below:     1. Diabetes  Currently taking Metformin 1000mg  BID + Invokana 100mg  daily + Toujeo 64 units daily  FPG are 130mg /dL one value as 156mg  likely had a later night snack  Post prandial sugars are all 180mg /dL  No hypoglycemia  Discontinue sliding scale insulin (hasn't needed to use at all)  Decrease Toujeo to 50units nightly  Increase Invokana to 300mg  daily  A1c in June was 5.6%, due for  repeat check     2. Other Medication-Related Interventions: In addition to the medication changes noted above, these medication-related interventions were completed by the Pharmacist during today's visit:  disease state education + other    Plan  Recommendations were discussed with Provider (Dr. Susette Racer) during appointment. Plan for implementation of medication changes are documented in Provider's note.     Patient was able to verbalize understanding and repeat back key educational points.    Follow-up:  Next PCP visit      Patient Status: Open to [facility] Services     Duration of Visit: 16-30 min    Thank you for allowing me to participate in the care of this patient. Please contact me with any questions.     Abram Sander, PharmD., BCCCP  Clinical Pharmacist Practitioner, CPP  [facility]  [address], [place], [place] [address]  [phone]

## 2023-04-23 MED ORDER — CANAGLIFLOZIN 300 MG PO TABS *A*
300.0000 mg | ORAL_TABLET | Freq: Every day | ORAL | 3 refills | Status: DC
Start: 2023-04-23 — End: 2023-07-24

## 2023-04-23 NOTE — Progress Notes (Addendum)
OMFS- [employer] documents received, signed by Dr. Bishop Dublin and faxed back to [phone]. Documents uploaded to [employer] under notes tab.

## 2023-04-23 NOTE — Telephone Encounter (Signed)
Discussed during in person visit. Decided to stop with inhaled tobramycin and monitor clinically as he had been on it awhile ago and may not still require it

## 2023-04-24 NOTE — Telephone Encounter (Signed)
Discussed during recent visit. We decided to hold off on further inhaled tobramycin for now.

## 2023-04-26 ENCOUNTER — Encounter: Payer: Self-pay | Admitting: Student in an Organized Health Care Education/Training Program

## 2023-04-26 NOTE — Progress Notes (Signed)
[facility]  Prior Authorization Request    Date of request: 04/26/2023  Time of call: 11:02 AM    Medication Requiring PA:   canagliflozin (INVOKANA) 100 mg tablet     Directions(dosing and frequency): Take 3 tablets (300 mg total) by mouth daily (with breakfast) for Type 2 Diabetes.     Quantity/ Day Supply: 90/30    Dx code: E11.9    Request received from:[facility] PA Source: Pharmacy    Name and Phone Number of Pharmacy:    Additional Pharmacy Benefit plan: no    Secondary insurance: Medicaid    Please run secondary if not covered under primary: Yes    Insurance coverage confirmed: yes    Covered Alternative Medication:     Tax ID: [id number]    Provider and NPI: [id number]    Additional Justification:n/a    Key ID: [id number]    Please provide justification for PA submission & urgency or indicate which covered alternative was ordered

## 2023-04-27 ENCOUNTER — Other Ambulatory Visit: Payer: Self-pay

## 2023-05-01 ENCOUNTER — Other Ambulatory Visit: Payer: Self-pay | Admitting: Student in an Organized Health Care Education/Training Program

## 2023-05-02 ENCOUNTER — Other Ambulatory Visit: Payer: Self-pay

## 2023-05-02 ENCOUNTER — Ambulatory Visit: Payer: Medicare Other | Attending: Student in an Organized Health Care Education/Training Program

## 2023-05-02 DIAGNOSIS — Z9911 Dependence on respirator [ventilator] status: Secondary | ICD-10-CM | POA: Insufficient documentation

## 2023-05-02 DIAGNOSIS — G8250 Quadriplegia, unspecified: Secondary | ICD-10-CM | POA: Insufficient documentation

## 2023-05-02 DIAGNOSIS — Z87820 Personal history of traumatic brain injury: Secondary | ICD-10-CM | POA: Insufficient documentation

## 2023-05-02 NOTE — Progress Notes (Signed)
Physical Medicine & Rehabilitation  Occupational Therapy  Daily Note        Name:  Angel Wells  MRN:  [id number]  Diagnosis:    Encounter Diagnoses   Name Primary?    Spastic quadriplegia Yes    History of traumatic brain injury     Ventilator dependence      Today's Date:  05/02/2023    Subjective: Caregiver reports splint on R UE continues to fall off despite adjustments. Caregiver reports that splint rubs on skin at [facility] MP despite adjustments and padding.     Pain: did not report    Intervention:  TREATMENT TIME  DESCRIPTION   Neuromuscular Re-Education 30 min Orthotic Training:  Designed and fabricated custom thermoplastic splint.  Affected Body Part:  Right, Arm  Splint Type:Anti-spasticity splint  Wearing Schedule: During the day               Patient Education:  Instructed patient/caregiver in splint care, wear & precautions, Patient/caregiver demonstrated understanding of instructions     Assessment: Anti-spasticity splint re-made today d/t ill fit despite previous adjustments. New splint fits well today.     Plan:  Continue with current OT plan of care    Clearnce Sorrel, OT  Minutes:  Total time: 30  Timed mins: 30  Untimed: 0   Unbillable: 0

## 2023-05-07 ENCOUNTER — Encounter: Payer: Self-pay | Admitting: Gastroenterology

## 2023-05-07 ENCOUNTER — Encounter: Payer: Self-pay | Admitting: Student in an Organized Health Care Education/Training Program

## 2023-05-07 NOTE — Progress Notes (Signed)
Incoming paperwork for completion to the [facility]  Date Received:    Date Paperwork was Intake: 05/07/2023     Type of paperwork:   Sender/organization: [employer]  Form type: Home Health Certification and Plan of Care    Copy and scan before mailing? No    Completed paperwork to be sent to:  Fax to [phone]    Placed in box for:  Fredrich Romans  9:30 AM  05/07/2023

## 2023-05-08 NOTE — Progress Notes (Signed)
Outgoing paperwork:    Completed paperwork received on  05/08/2023  Sent to scanning? Yes  Sent as outlined in prior documentation: Fax to [phone]    [name]  3:20 PM  05/08/2023

## 2023-05-23 ENCOUNTER — Other Ambulatory Visit: Payer: Self-pay | Admitting: Primary Care

## 2023-05-23 ENCOUNTER — Other Ambulatory Visit: Payer: Self-pay | Admitting: Student in an Organized Health Care Education/Training Program

## 2023-05-25 ENCOUNTER — Other Ambulatory Visit: Payer: Self-pay

## 2023-05-25 ENCOUNTER — Ambulatory Visit: Payer: Medicare Other | Attending: Student in an Organized Health Care Education/Training Program

## 2023-05-25 DIAGNOSIS — Z87820 Personal history of traumatic brain injury: Secondary | ICD-10-CM | POA: Insufficient documentation

## 2023-05-25 DIAGNOSIS — G8250 Quadriplegia, unspecified: Secondary | ICD-10-CM | POA: Insufficient documentation

## 2023-05-25 DIAGNOSIS — Z9911 Dependence on respirator [ventilator] status: Secondary | ICD-10-CM | POA: Insufficient documentation

## 2023-05-25 NOTE — Progress Notes (Signed)
Physical Medicine & Rehabilitation  Occupational Therapy  Discharge Note    Sent via: eRecord EMR INBASKET  Physician attestation for [facility] Plan of Care: Physician/NP/PA: Zollie Scale, DO  Per signature, I have reviewed and agree with the documented plan of care.    X______________________________________________________   Date:_____    Please sign this Medicare plan of care for outpatient therapy treatment as required by Medicare. If you have any questions, please contact us at [phone].  We appreciate your prompt attention to this request.    Sincerely,   [facility]      Name:  Angel Wells  MRN:  [id number]  Diagnosis:    Encounter Diagnoses   Name Primary?    Spastic quadriplegia Yes    History of traumatic brain injury     Ventilator dependence      Today's Date:  05/25/2023    Subjective: Pt and caregiver report splints fitting well.     Pain: 0/10     Promis; Depression screening came back normal with a score of .   Pain Interferences creening came back normal with a score of .  Physical Function screening came back severe with a score of .       Intervention:  TREATMENT TIME  DESCRIPTION   Neuromuscular Re-Education                        ADL training 40 min                        15 min Reassessment of pt's progress towards functional goals    [facility] training picking up 1 inch blocks, pt able to successfully grasp and release ~7 blocks using two point pinch into container SBA   Provided with HEP to continue to work on [facility] and hand function including grasping various household objects and two pinch with theraputty    ADL training facilitating drinking from cup with lid and straw, using universal cuff, recommending pt to wear thumb spica brace also to assist with grasp, pt able to complete with CGA-min A               Performance Deficits  ADL Status:  Impaired    Self Care Independent Modified   Independent Minimal  Assistance Moderate  Assistance Maximal  Assistance  Dependent   Eating     X Once food on utensil able to place in mouth  Improving, has demonstrated ability to self-feed with CGA    Aid reports that pt still getting assist for feeding although improved with universal cuff and bendable utensils, dycem, elbow raised, raised lip plate         Grooming           x   Bathing           X bed bath daily   Upper Body  Dressing           x   Lower Body  Dressing           x   Toileting           x      Instrumental ADL/Community status: Impaired       Independent Modified Indendpendent Minimal  Assistance Moderate   Assistance Maximal  Assistance   Homecare          x   Laundry  x   Shopping         x   Meal preparation          x   Medication  Management         x   Childcare         na   Phone Use          x      Handwriting: reports able to write with min A with built up utensil and foam tubing  Leisure: still unable to grasp game controller, did have AAT specialist come into home to do assessment but family choosing not to follow through with specialist    Transfers: Impaired     -uses overhead hoyer lift, dependant for all transfers  -uses manual WC for mobility     Vision:  impaired, but not currently using glasses.  Corrective lenses: No    Cognition: History of traumatic brain injury with resulting cognitive impairments. Formal cognitive testing not completed at this time.      Upper Extremity Assessment  Range of Motion: (unnoted implies WFL)  Shoulder    UE AROM (PROM) AROM (PROM)     Right Left   Scapula:       Protraction        trace trace   Retraction trace trace   Elevation 1/4 range 1/4 range   Depression       Shoulder:       Extension/Flexion 60 (135)  unchanged 30 (135)  unchanged   Abduction 55 (115)  unchanged 40 (115)  unchanged   Adduction       Horizontal Abduction       Horizontal Adduction       External Rotation/Internal Rotation       , Elbow    UE  AROM (PROM)  AROM (PROM)      Right  Left    Elbow Extension/Flexion full/120  unchanged  Rests in flexion (full )/105  Unchanged    Supination/Pronation       , and Wrist    UE  AROM (PROM)   AROM (PROM)     Right  Left    Wrist Extension/Flexion Trace, rests in flexion Rests in flexion    Radial/Ulnar Deviation           -L digits PIP contractures preventing full passive extension, trace activation of digit flexion/extension  -R Digits full PROM, R hand demonstrating active pincer grasp now improved  -B hands rest in digit flx     Strength: Manual Muscle Testing scores __/5 (unnoted implies WFL)   Measured unchanged  Motion Right Left   Scapular elevation       Shoulder flexion 2 2   Shoulder abduction 2 2   Internal rotation       External rotation       Elbow flexion 3 3   Elbow extension 3- 3-   Supination       Pronation       Wrist flexion 3 3   Wrist extension trace                        0      Grip/Pinch Assessment: No tested due to tone    Tone:  R UE unchanged  Bicep: 0   Wrist flexors: 2, clonus   Digit flexors: 1 , clonus      LUE  Bicep: 1 unchanged  Wrist flexors:  2 improved  Digit flexors: 1+, clonus  unchanged     Hand function: RHD, able to use universal cuff and built up handle to hold items when placed in hand, can now functionally grasp (with two point pinch) and release blocks, improved      Patient Education:  Discussed progress towards goals, Discussed/Reviewed treatment plan  Education Point Discipline Responsible Teaching Method  Verbal, Written, Video, or Demonstration Learner  Patient, Family, or Caregiver Date Education Initiated Date Completed   Durable Medical Equipment Physical Therapy  Occupational Therapy           Activities of Daily Living Occupational Therapy Verbal  Patient and caregiver 12/14/22  05/25/23   Bathroom Accessibility Occupational Therapy, Physical Therapy           Upper Extremity Orthotics Occupational Therapy           Independent Living / Chartered loss adjuster Work Engineer, water  Health Information Occupational Therapy            Cognition Speech Language Pathology  Occupational Therapy           Safety in Environment  Occupational Therapy, Physical Therapy              Goals:   STG  Pt will be educated in, demonstrate understanding of, and implement AE, compensatory strategies, and activity modifications for eating, writing, and leisure activities- met  [name] caregiver will demonstrate understanding of stretching and neuroinhibitive strategies to prevent hypertonicity in bilateral UE- met     LTG  Pt will improve functional use of hand as evidenced by being able to functionally grasp and release 5 blocks with R hand met  Pt will be able to self-feed with MI using AE, compensatory strategies, and activity modifications- discontinue, CGA-min A  Pt will be able to perform handwriting with mod A using AE, compensatory strategies, and activity modifications met  Pt will be able to perform gaming with min A with adaptations as needed- discontinue, given contact information assistive technology specialist    Assessment: Pt demonstrating gradual progress with feeding and handwriting with improved functional use of R UE and implementation of activity modifications and AE. Pt demonstrating limited progress in ROM although with emerging R hand function and some decreased spasticity of L wrist flexors. Did have consult with AT specialist to address remote control/game controller use but not following through with their services. Pt has reached maximum functional potential with OT services. Discharge OT. Recommend continued use of AE and HEP for tone management and R hand function.     Plan: Discharge OT    Clearnce Sorrel, OT  Minutes:  Total time: 55  Timed mins: 55   Untimed: 0   Unbillable: 0

## 2023-05-28 NOTE — Progress Notes (Signed)
[facility]    Visit Note     REASON FOR VISIT      Chief Complaint   Patient presents with    Follow-up     Was on tobramycin insurance denied it,   Prescitopn for tums to danwin  Redness around g tube      Initial Annual Medicare visit         PRIMARY DIAGNOSIS      TBI    Subjective     SUBJECTIVE      Diabetes  - continues to have improved sugar congrol  - usually fasting BG is below 130, rarely above 150  - post prandial usually less than 200  - not needing SSI at this time  - due for A1C        ROS  Review of Systems as per HPI above    Past Medical History, Social History, Family History, and Medications/allergies reviewed during this visit    Current Outpatient Medications   Medication    Docusate Sodium (SILACE) 150 MG/15ML syrup    guaifenesin (HUMIBID E) 400 mg TABS    insulin glargine 2 unit dial (TOUJEO MAX SOLOSTAR) 300 UNIT/ML injection pen    canagliflozin (INVOKANA) 300 mg tablet    canagliflozin (INVOKANA) 100 mg tablet    calcium carbonate (TUMS ULTRA) 1000 MG chewable tablet    B-D UF III MINI PEN NEEDLES 31G X 5 MM    cetirizine (ZYRTEC) 10 mg tablet    EPINEPHrine (EPIPEN) 0.3 mg/0.3 mL auto-injector    ipratropium (ATROVENT) 0.02 % nebulizer solution    albuterol (PROVENTIL) (2.5 mg/32mL) 0.083% nebulizer solution    bisacodyl (DULCOLAX) 10 mg suppository    tobramycin, PF, (TOBI) 300 MG/5ML nebulizer solution    metFORMIN (GLUCOPHAGE) 500 MG/5ML solution    valproate (VALPROIC ACID) 50 mg/mL oral solution    fluticasone (FLONASE) 50 MCG/ACT nasal spray    chlorhexidine (PERIDEX) 0.12 % solution    levETIRAcetam (KEPPRA) 100 mg/mL solution    carbamide peroxide (DEBROX) 6.5 % otic solution    baclofen (LIORESAL) 10 mg tablet    clonazePAM (KLONOPIN) 1 mg tablet    generic DME    blood glucose (ACCU-CHEK GUIDE) test strip    nystatin (MYCOSTATIN) ointment    lacosamide (VIMPAT) 10 mg/mL SOLN oral solution    azelastine (ASTELIN) 0.1 % nasal spray    lactobacillus rhamnosus,  GG, (CULTURELLE) capsule    sodium chloride 0.9 % nebulizer solution    budesonide (PULMICORT) 0.25 mg/54mL nebulizer suspension    nebulizer device with mask and tubing    Lactulose 20 gm/51mL solution    GLUCAGON EMERGENCY 1 MG injection kit    Probiotic Product (PROBIOTIC GUMMIES) 30 MG CHEW    EPINEPHrine (EPIPEN JR) 0.15 mg/0.3 mL auto-injector    bisacodyl (DULCOLAX) 10 mg suppository    Accu-Chek FastClix Lancets MISC    TOBI 300 MG/5ML nebulizer solution    famotidine (PEPCID) 20 mg tablet    sodium chloride (OCEAN) 0.65 % nasal spray    Adhesive Tape (PAPER TAPE 1"X10YD) TAPE    SENNA 8.6 MG tablet    nystatin (MYCOSTATIN) powder    generic DME    generic DME    generic DME    generic DME    generic DME    Gauze Pads & Dressings (SPLIT GAUZE DRAINAGE SPONGE) 4"X4"    generic DME    generic DME    generic DME  generic DME    generic DME    generic DME    generic DME    generic DME    oxygen    Misc. Devices (PULSE OXIMETER) MISC    generic DME    Cholecalciferol 25 MCG (1000 UT) CHEW    MIC-KEY gastrostomy kit 14FR    triamcinolone (KENALOG) 0.025 % cream    MIC-KEY gastrostomy feeding tube extension set 12"    MIC-KEY gastrostomy feeding tube extension set 24"    generic DME    diphenhydrAMINE (BENADRYL) 25 MG oral solid    Ascorbic Acid (VITAMIN C ADULT GUMMIES PO)    insulin pen needle (FIFTY50 PEN NEEDLES) 31G X 8 MM     No current facility-administered medications for this visit.          Objective     OBJECTIVE      BP 117/55   Pulse 83   Temp 36.1 C (97 F) (Temporal)   Ht 1.6 m (5\' 3" )   Wt 79.9 kg (176 lb 3.2 oz)   SpO2 95%   BMI 31.21 kg/m     Physical Exam  Constitutional:       General: He is not in acute distress.     Appearance: He is not ill-appearing.      Comments: Wheelchair bound   HENT:      Head: Atraumatic.      Nose: No congestion.      Mouth/Throat:      Mouth: Mucous membranes are moist.   Eyes:      Extraocular Movements: Extraocular movements intact.       Conjunctiva/sclera: Conjunctivae normal.   Cardiovascular:      Rate and Rhythm: Normal rate and regular rhythm.   Pulmonary:      Effort: Pulmonary effort is normal. No respiratory distress.      Breath sounds: No wheezing.   Musculoskeletal:         General: No swelling or deformity.   Skin:     General: Skin is warm and dry.      Capillary Refill: Capillary refill takes less than 2 seconds.   Neurological:      Mental Status: He is alert. Mental status is at baseline.                ASSESSMENT / DIAGNOSIS     1. Preventative health care  - received flu and covid shot this season  - due for A1C     2. Type 2 diabetes mellitus  - due for A1C  - with improved sugar control, will continue to move towards optimizing oral medications and reducing insulin need  - stop short acting SSI  - increase invokana to 300mg  daily   - decrease toujeo to 50 units nightly   - reviewed with pharmacy       Orders Placed This Encounter    canagliflozin (INVOKANA) 100 mg tablet    calcium carbonate (TUMS ULTRA) 1000 MG chewable tablet       Patient Instructions   Thank you for completing your Follow-up (Was on tobramycin insurance denied it, /Prescitopn for tums to danwin/Redness around g tube/) and Initial Annual Medicare visit   with [place] today.     The purpose of this visits was to:    Screen for disease  Assess risk of future medical problems  Help develop a healthy lifestyle  Update vaccines  Get to know your doctor in case of an illness  Patient Care Team:  Zollie Scale, DO as PCP - General (Internal Medicine)  Zollie Scale, DO as PCP - CCP-[facility] (Internal Medicine)  Konrad Dolores as Care Manager  Leta Speller, RRT as Respiratory Therapist (Pediatrics)     Medicare 5 Year Plan    The following items were identified as areas of concern during your screening today:  BMI greater than 25 - This is a risk for Heart Attack, Stroke, High Blood Pressure, Diabetes, High Cholesterol and other complications.       The Health  Maintenance table below identifies screening tests and immunizations recommended by your health care team:  Health Maintenance: These screening recommendations are based on USPSTF, [employer], and [place] state guidelines   Topic Date Due    HIV Screening  Never done    Hepatitis B Vaccine (1 of 3 - 19+ 3-dose series) Never done    Diabetic Foot Exam  07/06/2022    Diabetic A1C Monitoring  07/05/2023    Diabetic Kidney Disease Blood  01/03/2024    Diabetic Kidney Disease Urine  01/05/2024    Depression Screen Yearly  04/19/2024    Diabetic Eye Exam  07/12/2024    DTaP/Tdap/Td Vaccines (2 - Td or Tdap) 07/07/2031    Flu Shot  Completed    Hepatitis C Screening  Completed    Pneumococcal Vaccination  Completed    COVID-19 Vaccine  Completed    HIB Vaccine  Aged Out    HPV Vaccine  Aged Out    Meningococcal Vaccine  Aged Out    Rotavirus Vaccine  Aged Out     In addition, goals and orders placed to address these recommendations are listed in the "Today's Visit" section.    We wish you the best of health and look forward to seeing you again next year for your Annual Medicare Wellness Visit.     If you have any health care concerns before then, please do not hesitate to contact us.        --Patient instructed to call if symptoms are not improving or worsening  --Follow-up arranged  Follow up in about 3 months (around 07/21/2023) for Follow up, Diabetes.     I personally spent 30 minutes on the calendar day of the encounter, including pre and post visit work reviewing the EMR and management of this patient.     Electronically signed by Zollie Scale, DO at 6:37 AM on 05/28/2023     [facility], Phone: [phone]

## 2023-06-01 ENCOUNTER — Other Ambulatory Visit: Payer: Self-pay | Admitting: Student in an Organized Health Care Education/Training Program

## 2023-06-01 DIAGNOSIS — E1165 Type 2 diabetes mellitus with hyperglycemia: Secondary | ICD-10-CM

## 2023-06-01 DIAGNOSIS — J4540 Moderate persistent asthma, uncomplicated: Secondary | ICD-10-CM

## 2023-06-01 DIAGNOSIS — Z794 Long term (current) use of insulin: Secondary | ICD-10-CM

## 2023-06-01 DIAGNOSIS — E119 Type 2 diabetes mellitus without complications: Secondary | ICD-10-CM

## 2023-06-05 ENCOUNTER — Other Ambulatory Visit: Payer: Self-pay | Admitting: Student in an Organized Health Care Education/Training Program

## 2023-06-05 ENCOUNTER — Encounter: Payer: Self-pay | Admitting: Student in an Organized Health Care Education/Training Program

## 2023-06-05 DIAGNOSIS — E119 Type 2 diabetes mellitus without complications: Secondary | ICD-10-CM

## 2023-06-05 DIAGNOSIS — Z794 Long term (current) use of insulin: Secondary | ICD-10-CM

## 2023-06-05 DIAGNOSIS — E1165 Type 2 diabetes mellitus with hyperglycemia: Secondary | ICD-10-CM

## 2023-06-05 NOTE — Progress Notes (Signed)
[facility]  Prior Authorization Request    Date of request: 06/05/2023  Time of call: 10:47 AM    Medication Requiring PA: ipratropium (ATROVENT) 0.02 % nebulizer solution    Directions(dosing and frequency): INHALE 2.5 MLS (500 MCG TOTAL) BY NEBULIZATION 2 TIMES DAILY FOR ASTHMA     Quantity/ Day Supply: 125/28    Dx code: J45.909    Request received from:[facility] PA Source: Pharmacy    Name and Phone Number of Pharmacy:    Additional Pharmacy Benefit plan: no    Secondary insurance: Medicaid    Please run secondary if not covered under primary: yes       Insurance coverage confirmed: yes    Covered Alternative Medication:     Tax ID: [id number]    Provider and NPI: [id number]    Additional Justification:n/a    Key ID: [id number]    Please provide justification for PA submission & urgency or indicate which covered alternative was ordered

## 2023-06-06 ENCOUNTER — Other Ambulatory Visit: Payer: Self-pay | Admitting: Student in an Organized Health Care Education/Training Program

## 2023-06-13 ENCOUNTER — Other Ambulatory Visit: Payer: Self-pay | Admitting: Oncology

## 2023-06-14 NOTE — Telephone Encounter (Signed)
Spoke with insurance and was advised

## 2023-06-19 ENCOUNTER — Encounter: Payer: Self-pay | Admitting: Student in an Organized Health Care Education/Training Program

## 2023-06-19 NOTE — Progress Notes (Signed)
Incoming paperwork for completion to the [facility]  Date Received:    Date Paperwork was Intake: 06/19/2023     Type of paperwork:   Sender/organization: [employer]  Form type: Missed Visit Note/ Home Health Certification and Plan of Care    Completed paperwork to be sent to:  Fax to [phone]    Placed in box for: Kyra Manges  9:27 AM  06/19/2023

## 2023-06-20 ENCOUNTER — Other Ambulatory Visit: Payer: Self-pay | Admitting: Neurology

## 2023-06-20 ENCOUNTER — Encounter: Payer: Self-pay | Admitting: Gastroenterology

## 2023-06-20 DIAGNOSIS — G40219 Localization-related (focal) (partial) symptomatic epilepsy and epileptic syndromes with complex partial seizures, intractable, without status epilepticus: Secondary | ICD-10-CM

## 2023-06-20 NOTE — Progress Notes (Signed)
Outgoing paperwork:    Completed paperwork received on  06/20/2023  Sent to scanning? Yes  Sent as outlined in prior documentation:  [phone]     Lizbeth Bark  3:39 PM  06/20/2023

## 2023-06-21 MED ORDER — LACOSAMIDE 10 MG/ML PO SOLN *I*
ORAL | 5 refills | Status: DC
Start: 2023-06-21 — End: 2023-12-14

## 2023-06-22 ENCOUNTER — Other Ambulatory Visit
Admission: RE | Admit: 2023-06-22 | Discharge: 2023-06-22 | Disposition: A | Discharge status: Home / Self Care | Payer: Medicare Other | Source: Ambulatory Visit | Attending: Student in an Organized Health Care Education/Training Program | Admitting: Student in an Organized Health Care Education/Training Program

## 2023-06-22 ENCOUNTER — Other Ambulatory Visit: Payer: Self-pay

## 2023-06-22 ENCOUNTER — Ambulatory Visit
Payer: Medicare Other | Attending: Student in an Organized Health Care Education/Training Program | Admitting: Student in an Organized Health Care Education/Training Program

## 2023-06-22 ENCOUNTER — Encounter: Payer: Self-pay | Admitting: Gastroenterology

## 2023-06-22 VITALS — BP 121/64 | HR 91 | Temp 97.2°F | Wt 174.6 lb

## 2023-06-22 DIAGNOSIS — E119 Type 2 diabetes mellitus without complications: Secondary | ICD-10-CM | POA: Insufficient documentation

## 2023-06-22 DIAGNOSIS — H1013 Acute atopic conjunctivitis, bilateral: Secondary | ICD-10-CM | POA: Insufficient documentation

## 2023-06-22 DIAGNOSIS — Z1329 Encounter for screening for other suspected endocrine disorder: Secondary | ICD-10-CM | POA: Insufficient documentation

## 2023-06-22 DIAGNOSIS — J4540 Moderate persistent asthma, uncomplicated: Secondary | ICD-10-CM | POA: Insufficient documentation

## 2023-06-22 LAB — COMPREHENSIVE METABOLIC PANEL
ALT: 65 U/L — ABNORMAL HIGH (ref 0–50)
AST: 24 U/L (ref 0–50)
Albumin: 4.4 g/dL (ref 3.5–5.2)
Alk Phos: 79 U/L (ref 40–130)
Anion Gap: 17 — ABNORMAL HIGH (ref 7–16)
Bilirubin,Total: <0.2 mg/dL (ref 0.0–1.2)
CO2: 23 mmol/L (ref 20–28)
Calcium: 9.9 mg/dL (ref 9.0–10.3)
Chloride: 98 mmol/L (ref 96–108)
Creatinine: 0.38 mg/dL — ABNORMAL LOW (ref 0.67–1.17)
Glucose: 118 mg/dL — ABNORMAL HIGH (ref 60–99)
Lab: 11 mg/dL (ref 6–20)
Potassium: 4.1 mmol/L (ref 3.3–5.1)
Sodium: 138 mmol/L (ref 133–145)
Total Protein: 8.1 g/dL — ABNORMAL HIGH (ref 6.3–7.7)
eGFR BY CREAT: 152 *

## 2023-06-22 LAB — TSH: TSH: 0.87 u[IU]/mL (ref 0.27–4.20)

## 2023-06-22 MED ORDER — IPRATROPIUM BROMIDE 0.02 % IN SOLN *I*
500.0000 ug | Freq: Three times a day (TID) | RESPIRATORY_TRACT | 5 refills | Status: DC
Start: 2023-06-22 — End: 2023-07-23

## 2023-06-22 NOTE — Progress Notes (Signed)
 [facility]     Subjective     Angel Wells is a 32 y.o. male who presents for No chief complaint on file.  History of Present Illness  The patient presents for follow up of diabetes    He has been experiencing persistent ocular discharge, particularly noticeable in the mornings. This symptom has been present for an extended period. He reports no associated fever. There is a pet dog in the household, which has been present for a significant duration. The heating system was activated approximately one month ago, but the ocular discharge predates this event. He reports no new cough or rashes.    He has been experiencing facial flushing, which has been more pronounced this week. He also reports occasional morning redness on the right side of his face, which appears to improve with Astelin. He plans to monitor these symptoms upon his return from a week-long trip.    He is currently on insulin therapy and requires blood glucose testing three times daily. He is running low on test strips, with an estimated supply lasting for another two weeks. He has experienced some weight loss and is considering dietary modifications. He engages in physical exercise three times daily.    He is immunocompromised and is seeking advice regarding the RSV vaccine. He is currently on nighttime mechanical ventilation.    He has been prescribed lacosamide by Dr. Jacob Moores and is due for a refill. He also requires a refill of ipratropium, which is administered three times daily.    Supplemental Information  His wisdom teeth have been extracted, and he is doing well post-procedure.    MEDICATIONS  Current: insulin, tobramycin, lacosamide, ipratropium       Objective   Blood pressure 121/64, pulse 91, temperature 36.2 C (97.2 F), temperature source Temporal, weight 79.2 kg (174 lb 9.6 oz), SpO2 95%.  Physical Exam      Results  Laboratory Studies  Blood sugar levels have been stable without signs of hypoglycemia            Assessment & Plan  1. Diabetes Mellitus.  His blood glucose levels are within the normal range, indicating effective management of his diabetes. He is advised to continue monitoring his blood glucose levels three times daily while adjustments to his diabetes medication are being made. A hemoglobin A1c test and a comprehensive metabolic panel (CMP) will be conducted prior to the next medication adjustment. If his A1C continues to stay stable, will reduce insulin and continue to follow sugars.     2. Hyponatremia.  He has a history of hyponatremia. His sodium levels will be checked during the upcoming blood work to monitor this condition.    3. Ocular Discharge.  The ocular discharge does not appear to be bacterial in nature, as it is not accompanied by significant amounts of purulent material. The presence of a pet dog in the household could potentially contribute to his symptoms. Continue treatment for allergic conjunctivitis.    4. Facial Flushing.  The facial flushing could be indicative of an allergic reaction, given its responsiveness to Astelin. If the symptoms resolve, it could be viral in nature. He is advised to monitor his symptoms and report any changes.    5. Immunocompromised Status and concern for RSV  Reviewed that he does not meet the outlined requirements for RSV vaccination, though I can understand concern that he is high risk. A note will be provided to the pharmacy detailing his immunocompromised status, history  of tracheostomy, and requirement for nighttime mechanical ventilation, which may necessitate an RSV vaccination.    6. Medication Management.  Prescriptions for lacosamide 10 mg/mL, 10 mL twice a day, and ipratropium three times daily have been provided. The ipratropium prescription has been adjusted to reflect the correct dosage.    PROCEDURE  The patient had his wisdom teeth extracted and is doing well post-procedure.      I personally spent 30 minutes on the calendar day of the  encounter, including pre and post visit work.           Author: Zollie Scale, DO  Note signed: 07/26/2023

## 2023-06-24 LAB — HEMOGLOBIN A1C: Hemoglobin A1C: 4.9 %

## 2023-06-29 ENCOUNTER — Telehealth: Payer: Self-pay | Admitting: Student in an Organized Health Care Education/Training Program

## 2023-06-29 NOTE — Telephone Encounter (Signed)
[facility]  Prior Authorization Request    Date of request: 06/29/2023  Time of call: 11:23 AM    Medication Requiring PA: Glucose Blood (ACCU-CHEK GUIDE) test strip  Directions(dosing and frequency): USE 1 STRIP 3 TIMES A DAY   Quantity/ Day Supply: 100/30  Dx code: Type 2 diabetes mellitus [E11.9]      Request received from:[facility] PA Source: Patient  Name and Phone Number of Pharmacy:  [facility] [phone]  [phone]     Tax ID: [id number]  Provider and NPI: [name] - [id number]    Please provide justification for PA submission & urgency or indicate which covered alternative was ordered

## 2023-07-02 NOTE — Telephone Encounter (Signed)
Spoke with pharmacy and was advised that PA was not needed and that the pharmacy was waiting on inventory

## 2023-07-05 ENCOUNTER — Other Ambulatory Visit: Payer: Self-pay | Admitting: Student in an Organized Health Care Education/Training Program

## 2023-07-05 MED ORDER — GVOKE HYPOPEN 1-PACK 1 MG/0.2ML SC SOAJ
SUBCUTANEOUS | 5 refills | Status: AC
Start: 2023-07-05 — End: ?

## 2023-07-10 ENCOUNTER — Other Ambulatory Visit: Payer: Self-pay | Admitting: Student in an Organized Health Care Education/Training Program

## 2023-07-10 NOTE — Telephone Encounter (Signed)
[facility] TELEPHONE TRIAGE     Reason for call: [name] requests to receive an updated order for patients guaifenesin tablets. [name] states patient has been receiving three tablets daily. [name] requests prescription for three tablets daily is sent to [facility].     Name of caller: [name] for Aundrea Demmons  Relationship to patient: PDN  Phone: [phone]

## 2023-07-12 ENCOUNTER — Telehealth: Payer: Self-pay | Admitting: Optometrist

## 2023-07-12 MED ORDER — GUAIFENESIN 400 MG PO TABS *I*
ORAL_TABLET | ORAL | 5 refills | Status: DC
Start: 2023-07-12 — End: 2023-12-06

## 2023-07-12 NOTE — Telephone Encounter (Signed)
 Angel Wells's nurse Polly Brink called to confirm appointment date, time, and location. Writer advised and marked confirmed.

## 2023-07-13 ENCOUNTER — Other Ambulatory Visit: Payer: Self-pay | Admitting: Student in an Organized Health Care Education/Training Program

## 2023-07-13 DIAGNOSIS — Z8669 Personal history of other diseases of the nervous system and sense organs: Secondary | ICD-10-CM

## 2023-07-17 ENCOUNTER — Other Ambulatory Visit: Payer: Self-pay | Admitting: Student in an Organized Health Care Education/Training Program

## 2023-07-19 ENCOUNTER — Telehealth: Payer: Self-pay | Admitting: Optometrist

## 2023-07-19 NOTE — Telephone Encounter (Signed)
07/19/2023    I wanted you to be aware that the following patient has cancelled their appointment.    Provider Name: Everardo Beals   Reason for Cancellation: Transportation issue    Patient Rescheduled (Date/Time): 09/07/23@ 1:15   Patient did not reschedule, will call back    Patient does not want to reschedule    Patient aware of change of location from original scheduled appointment    Date of Appointment: 07/20/23   Patient Name: Angel Wells   MRN: [id number]   DOB: [birth year]       Thank you,  Jassmin Kemmerer

## 2023-07-20 ENCOUNTER — Ambulatory Visit: Payer: Medicare Other | Admitting: Optometrist

## 2023-07-20 ENCOUNTER — Other Ambulatory Visit: Payer: Self-pay | Admitting: Student in an Organized Health Care Education/Training Program

## 2023-07-23 ENCOUNTER — Other Ambulatory Visit: Payer: Self-pay | Admitting: Student in an Organized Health Care Education/Training Program

## 2023-07-23 DIAGNOSIS — J4540 Moderate persistent asthma, uncomplicated: Secondary | ICD-10-CM

## 2023-07-24 MED ORDER — IPRATROPIUM BROMIDE 0.02 % IN SOLN *I*
500.0000 ug | Freq: Three times a day (TID) | RESPIRATORY_TRACT | 5 refills | Status: DC
Start: 2023-07-24 — End: 2023-08-23

## 2023-08-02 ENCOUNTER — Other Ambulatory Visit: Payer: Self-pay | Admitting: Oncology

## 2023-08-02 DIAGNOSIS — K219 Gastro-esophageal reflux disease without esophagitis: Secondary | ICD-10-CM

## 2023-08-02 NOTE — Telephone Encounter (Signed)
 [facility]  Prior Authorization Request     Date of request: 07/24/2023  Time of call: 10:47 AM     Medication Requiring PA: ipratropium (ATROVENT) 0.02 % nebulizer solution     Directions(dosing and frequency): INHALE 2.5 MLS (500 MCG TOTAL) BY NEBULIZATION 2 TIMES DAILY FOR ASTHMA      Quantity/ Day Supply: 125/28     Dx code: J45.909     Request received from:[facility] PA Source: Pharmacy     Name and Phone Number of Pharmacy:     Additional Pharmacy Benefit plan: no     Secondary insurance: Medicaid     Please run secondary if not covered under primary: yes        Insurance coverage confirmed: yes     Covered Alternative Medication:      Tax ID: [id number]     Provider and NPI: [id number]     Additional Justification:n/a     Key ID: [id number]     Please provide justification for PA submission & urgency or indicate which covered alternative was ordered

## 2023-08-10 ENCOUNTER — Telehealth: Payer: Self-pay | Admitting: Student in an Organized Health Care Education/Training Program

## 2023-08-10 DIAGNOSIS — J4540 Moderate persistent asthma, uncomplicated: Secondary | ICD-10-CM

## 2023-08-10 NOTE — Telephone Encounter (Signed)
[facility] TELEPHONE TRIAGE     Reason for call: [name] states the current quantity prescribed for the ipratropium prescription is incorrect. [name] states patient is to receive 750 ml for a 30 day supply. [name] requests prescription is reviewed, corrected, and a new Rx is sent to the pharmacy.     Name of caller: [name] for Angel Wells  Relationship to patient: PDN  Phone:  [phone]

## 2023-08-10 NOTE — Telephone Encounter (Signed)
Spoke with [name], clarified that current order from 07/24/2023 is correct. [name] says that order was denied and is need of PA. Says that pt needs this TID and insurance was only allowing for BID. Will route to PA specialist to see if PA can be obtained.

## 2023-08-10 NOTE — Telephone Encounter (Signed)
Spoke with pharmacy this morning and advised it needed to be billed through part B. Pharmacy stated received paid claim

## 2023-08-15 ENCOUNTER — Telehealth: Payer: Self-pay | Admitting: Student in an Organized Health Care Education/Training Program

## 2023-08-15 NOTE — Telephone Encounter (Signed)
Called and spoke to [employer] pt's most recent A1C was 4.9  His BGs have been trending down over the past few months  Lately BGs have been around the high 90s-low 100s  Feel they need an appointment to consider insulin adjustments    There is also an error in his Ipratropium order that is preventing insurance from allowing refills  States the order is written for twice daily but it should only be written for once daily  Current order in chart written for 3 times daily  States she would like to discuss this with provider at visit    Booked in clinic on 2/13 @ 3pm    Advised to call office with any questions, concerns, or acute changes  Acknowledged understanding    Will route to PCP

## 2023-08-15 NOTE — Telephone Encounter (Signed)
[facility] TELEPHONE INTAKE    Reason for call: [name] requesting a call back about patient's insulin.    Name of caller: [name]  Relationship to patient:   Organization:   Phone:  [phone]  Fax:

## 2023-08-17 ENCOUNTER — Other Ambulatory Visit: Payer: Self-pay | Admitting: Student in an Organized Health Care Education/Training Program

## 2023-08-17 DIAGNOSIS — K219 Gastro-esophageal reflux disease without esophagitis: Secondary | ICD-10-CM

## 2023-08-21 ENCOUNTER — Encounter: Payer: Self-pay | Admitting: Student in an Organized Health Care Education/Training Program

## 2023-08-21 NOTE — Progress Notes (Signed)
Incoming paperwork for completion to the [facility]  Date Received:    Date Paperwork was Intake: 08/21/2023     Type of paperwork:   Sender/organization: [employer]   Form type: POC for continuing skilled nursing services      Completed paperwork to be sent to:  Fax to [phone]    Placed in box for: Lawana Pai  8:59 AM  08/21/2023

## 2023-08-22 ENCOUNTER — Encounter: Payer: Self-pay | Admitting: Gastroenterology

## 2023-08-23 ENCOUNTER — Other Ambulatory Visit: Payer: Self-pay

## 2023-08-23 ENCOUNTER — Ambulatory Visit
Payer: Medicare Other | Attending: Student in an Organized Health Care Education/Training Program | Admitting: Student in an Organized Health Care Education/Training Program

## 2023-08-23 VITALS — BP 134/70 | HR 83 | Temp 96.8°F | Wt 174.2 lb

## 2023-08-23 DIAGNOSIS — E119 Type 2 diabetes mellitus without complications: Secondary | ICD-10-CM | POA: Insufficient documentation

## 2023-08-23 DIAGNOSIS — J4540 Moderate persistent asthma, uncomplicated: Secondary | ICD-10-CM | POA: Insufficient documentation

## 2023-08-23 MED ORDER — IPRATROPIUM BROMIDE 0.02 % IN SOLN *I*
500.0000 ug | Freq: Two times a day (BID) | RESPIRATORY_TRACT | 5 refills | Status: DC
Start: 2023-08-23 — End: 2023-09-07

## 2023-08-23 MED ORDER — IPRATROPIUM BROMIDE 0.02 % IN SOLN *I*
500.0000 ug | Freq: Every day | RESPIRATORY_TRACT | 2 refills | Status: DC | PRN
Start: 2023-08-23 — End: 2023-10-10

## 2023-08-23 MED ORDER — IPRATROPIUM BROMIDE 0.02 % IN SOLN *I*
500.0000 ug | Freq: Two times a day (BID) | RESPIRATORY_TRACT | 5 refills | Status: DC
Start: 2023-08-23 — End: 2023-08-23

## 2023-08-23 NOTE — Progress Notes (Signed)
 [facility]     Subjective     Angel Wells is a 33 y.o. male who presents for Follow-up  History of Present Illness  The patient presents for evaluation of diabetes, facial flushing, and medication management.    History is reported by other person in the presence of the patient.  His blood glucose levels have been well-managed, with only one instance of a reading exceeding the target range at 142. The majority of his readings are below 115, with the lowest recorded value being 98. He has been adhering to a healthy diet. It is reported that he will continue to monitor his blood glucose levels three times daily until the cessation of Toujeo, after which the frequency will be reduced to twice daily, and eventually once weekly. He is currently on a regimen of Toujeo 50 units.    It is reported that he has been experiencing facial flushing, which was initially attributed to a change in the brand of products he uses. However, upon further observation, it is believed that this may be a side effect of Astelin nasal spray. Despite this, they are reluctant to discontinue the medication due to its efficacy. The flushing does not occur consistently and is not associated with any respiratory symptoms or throat swelling. He is currently receiving two sprays of Astelin in each nostril, and there is a suggestion to reduce this to one spray. It is also noted that he wakes up with eye discharge, redness, and puffiness, which resolve within an hour.    It is reported that he has been prescribed Atrovent three times daily, but the pharmacy did not receive the order and assumed it was to be administered twice daily. This resulted in them running out of the medication. A new order was requested, but it was denied by the insurance company. He has shown improvement with the three times daily dosage of Atrovent, which is administered four times daily when he is ill and twice daily as needed. They are currently running low on  the medication as they were only provided with enough for 17 days. He has not required hospitalization since starting the three times daily Atrovent regimen. It is also reported that he has not required albuterol as frequently as before.    MEDICATIONS  Current: Toujeo, canagliflozin, Invokana, Atrovent, Astelin    IMMUNIZATIONS  He received a COVID-19 vaccine in September.       Objective   Blood pressure 134/70, pulse 83, temperature 36 C (96.8 F), temperature source Temporal, weight 79 kg (174 lb 3.2 oz), SpO2 96%.  Physical Exam      Results  Laboratory Studies  A1c was 4.9 on December 9.          Assessment & Plan  1. Diabetes Mellitus.  His A1c level, last measured on December 9, was 4.9, indicating a prediabetic state. His morning blood glucose values for December and January were predominantly below 115, with a single instance of 142. The lowest recorded value was 98. Given the well-controlled A1c and the recent increase in canagliflozin (Invokana) dosage, a reduction in Toujeo to 20 units is proposed. The Toujeo dosage will be reduced to 20 units. An A1c test will be ordered for March. He will continue with his current oral medications. If the A1c level remains below 6% in March, the Toujeo will be discontinued, and he will maintain his oral medication regimen. If he experiences hypoglycemia or a further decrease in A1c, adjustments to the oral  medications will be considered.    2. Facial Flushing.  The facial redness does not appear to be rosacea. It could potentially be histamine-related, causing vasodilation. Astelin, an antihistamine, has been effective in managing his symptoms. He will continue with the Astelin nasal spray, and the situation will be closely monitored. If the redness persists or worsens, further evaluation will be necessary.    3. Medication Management.  A letter will be written to the insurance company to request approval for Atrovent three times daily. Prescriptions for Atrovent  twice daily and once daily as needed have been provided. [facility] [place] [facility] [address].    4. Health Maintenance.  He is eligible for a second COVID-19 vaccine dose on March 20. The pneumonia vaccine will be deferred until a new version becomes available. The RSV vaccine is not indicated and may not be covered by insurance.      I personally spent 30 minutes on the calendar day of the encounter, including pre and post visit work.           Author: Zollie Scale, DO  Note signed: 09/12/2023

## 2023-08-23 NOTE — Progress Notes (Signed)
[facility] Comprehensive Medication Management Note    Angel Wells is a 33 y.o. male who presents for co-visit with PCP and clinical pharmacist. Patient was referred by PCP for comprehensive medication management in the setting of uncontrolled Type 2 DM and polypharmacy/medication complexity. Patient's medications, preferred pharmacy and allergies were updated and marked as reviewed, as appropriate.                  Vitals:    08/23/23 1518   BP: 134/70   Pulse: 83   Temp: 36 C (96.8 F)   Weight: 79 kg (174 lb 3.2 oz)        Additional labs/vitals as documented in chart - reviewed all relevant objective info at time of visit.    Current Outpatient Medications   Medication Sig Dispense Refill    famotidine (PEPCID) 20 mg tablet take 1 tablet by mouth 2 (two) times daily. ok to crush it and put it down tube if necessary 180 tablet 0    INVOKANA 300 MG tablet take 1 tablet by mouth daily (before breakfast). 30 tablet 5    ipratropium (ATROVENT) 0.02 % nebulizer solution Inhale 2.5 mLs (500 mcg total) by nebulization 3 times daily. 125 mL 5    metFORMIN (GLUCOPHAGE) 500 MG/5ML solution administer 10 mls (1,000 milligram total) via g tube 2 times daily (with meals) 473 mL 5    carbamide peroxide (DEBROX) 6.5 % otic solution use 5 drops twice daily on the first 3 days of each month. 15 mL 0    guaifenesin (HUMIBID E) 400 mg TABS take 1 tablet by mouth 3 times daily 90 tablet 5    Glucose Blood (ACCU-CHEK GUIDE) test strip USE 1 STRIP 3 TIMES A DAY 100 each 5    lacosamide (VIMPAT) 10 mg/mL SOLN oral solution Take 10 MILLILITER per tube twice daily. ; MAXIMUM DAILY DOSE 20 MILLILITER 600 mL 5    levETIRAcetam (KEPPRA) 100 mg/mL solution give 15 milliliter by g-tube twice daily 1050 mL 3    insulin glargine 2 unit dial (TOUJEO MAX SOLOSTAR) 300 UNIT/ML injection pen inject 50 units into the skin nightly for type 2 diabetes. discard each pen 56 days after first use. 9 mL 5    Docusate Sodium (SILACE) 150  MG/15ML syrup take 10 mls (100 milligram total) by mouth 3 times daily hold for loose stool 900 mL 5    canagliflozin (INVOKANA) 100 mg tablet Take 3 tablets (300 mg total) by mouth daily (with breakfast) for Type 2 Diabetes. 90 tablet 0    calcium carbonate (TUMS ULTRA) 1000 MG chewable tablet Place 1 tablet (1,000 mg total) into mouth, chew and swallow daily. 30 tablet 3    B-D UF III MINI PEN NEEDLES 31G X 5 MM USE 3 (THREE) TIMES DAILY AS NEEDED. 100 each 5    cetirizine (ZYRTEC) 10 mg tablet Administer 1 tablet (10 mg total) via G tube nightly. 90 tablet 2    EPINEPHrine (EPIPEN) 0.3 mg/0.3 mL auto-injector use as directed 2 each 0    albuterol (PROVENTIL) (2.5 mg/4mL) 0.083% nebulizer solution INHALE 3 MLS BY NEBULIZATION 4 TIMES DAILY FOR SPASM OF LUNG AIR PASSAGES WITH VESTING. WHEN ILL, USE FOUR TIMES A DAY WITH VESTING. 1050 mL 3    valproate (VALPROIC ACID) 50 mg/mL oral solution take 5 milliliter by mouth 3 times daily 1350 mL 5    fluticasone (FLONASE) 50 MCG/ACT nasal spray spray 1 spray into nostril daily 16  g 5    chlorhexidine (PERIDEX) 0.12 % solution use as directed 473 mL 5    baclofen (LIORESAL) 10 mg tablet take 1 tablet (10 milligram total) by mouth 2 times daily 180 tablet 3    generic DME Give 1/2 cup of juice through gtube for BG lower than 70. Repeat BG after 15 minutes. If still below 70, give another 1/2 cup of juice.      azelastine (ASTELIN) 0.1 % nasal spray Apply 2 sprays in each nostril every morning and then in the afternoon as needed 30 mL 11    lactobacillus rhamnosus, GG, (CULTURELLE) capsule Take 2 capsules (2 each total) by mouth daily. 30 capsule 5    budesonide (PULMICORT) 0.25 mg/74mL nebulizer suspension INHALE 2 ML (CONTENT OF 1 VIAL) VIA NEBULIZER 2 TIMES DAILY FOR ASTHMA 360 mL 5    nebulizer device with mask and tubing Use as directed 1 kit 0    Probiotic Product (PROBIOTIC GUMMIES) 30 MG CHEW Take 30 mg by mouth daily      bisacodyl (DULCOLAX) 10 mg suppository please  give suppository every other day, please check with dad prior to giving 12 each 5    Accu-Chek FastClix Lancets MISC 1 EACH BY MISC.(NON-DRUG COMBO ROUTE) ROUTE 3 (THREE) TIMES DAILY. E11.65 102 each 5    Adhesive Tape (PAPER TAPE 1"X10YD) TAPE Use as directed. Diagnosis code: G82.50 300 each 5    SENNA 8.6 MG tablet administer 2 tablets via g tube daily 180 tablet 5    generic DME Low rate alarms set at 4 breaths, high rate alarm set at 40 breaths, pressure support set at 13 cms 1 each 0    generic DME Ventilator for home use with humidifier 1 each 0    generic DME Item to dispense: Bivona silicone cuffed trach tube size 7 80 mm length  Instructions for use: Use as directed 2 each 5    generic DME Item to dispense: trach care kits  Instructions for use: use as directed 30 each 5    generic DME Item to dispense: trach ties  Instructions for use: use as directed 30 each 5    Gauze Pads & Dressings (SPLIT GAUZE DRAINAGE SPONGE) 4"X4" Use  as instructed. 30 each 5    generic DME Item to dispense: 2X2 gauze 30 each 5    generic DME Item to dispense: stationary pulse oximeter 1 each 0    generic DME Item to dispense: Purple Passey-Muir valve  Instructions for use: Use As Directed 1 each 5    generic DME Vent settings:  SIMV/PC/PS, RATE 10 IP 21. PS 13, PEEP 7, HIGH PRESSURE 50, LOW PRESSURE 5, HIGH RR 40, LOW RR14 1 each 0    generic DME .Cough Assist and related supplies. Use as directed 1 each 0    generic DME Item to dispense: 2 Tracheostomies #7 Bivona Silicone Cuffed 80 mm Length  Instructions for use: use as directed 2 each 6    generic DME Gel Overlay Mattress.  LON: 99, [id number]. Use as directed. G82.50, S06.9x9 1 each 0    generic DME Non-sterile gloves size medium Use as directed. MCAID ID [id number] 2boxes/month 3 each 5    oxygen 1-2 L/min to keep O2 sat above 92%, may increase up to 4 lpm as needed.  Low saturation alarm 85%, high alarm off, low HR 50, High HR 200 1 each 0    Misc. Devices (PULSE OXIMETER)  MISC Pulse Oximeter with  Supplies, LON: 99, [id number], J96.11, Z99.11 1 each 0    generic DME Hill Rom Percussion Vest Model # 105  55inch  Use as directed. 1 each 0    Cholecalciferol 25 MCG (1000 UT) CHEW Take 1,000 units by mouth daily  2 chews daily      MIC-KEY gastrostomy kit 14FR Please dispense every 3 months and prn, MicKey 14Fr 2.5cm 1 kit 3    MIC-KEY gastrostomy feeding tube extension set 12" Use as directed with gtube 1 kit 11    MIC-KEY gastrostomy feeding tube extension set 24" Use as directed, please dispense 4 per month 4 kit 5    generic DME PASSEY MUIR VALVE (PURPLE) Use as tolerated.MCAID ID [id number] 1 each 3    Ascorbic Acid (VITAMIN C ADULT GUMMIES PO) Take 250 mg by mouth daily  Give 2 gummies once daily       insulin pen needle (FIFTY50 PEN NEEDLES) 31G X 8 MM 1 each      Glucagon (GVOKE HYPOPEN 1-PACK) 1 MG/0.2ML SOAJ for Disorder with Low Blood Sugar. Use as needed for blood glucose < 70 1 each 5    bisacodyl (DULCOLAX) 10 mg suppository place 1 suppository rectally every other day and as needed if pt has not had a bowel movement in 2 days 30 suppository 5    clonazePAM (KLONOPIN) 1 mg tablet take 1 tablet by mouth twice daily as needed (crush for g-tube) for seizures; maximum daily dose 2 tabs 10 tablet 0    nystatin (MYCOSTATIN) ointment apply topically with each diaper change as needed for diaper rash. to the following areas: gtube site after mild cleansing 15 g 5    sodium chloride 0.9 % nebulizer solution Inhale 5 mLs by nebulization 3 times daily 17 mL 0    Lactulose 20 gm/27mL solution administer 30 mls (20 g total) via g tube daily as needed (no stool in 48 hours) use as directed 946 mL 5    EPINEPHrine (EPIPEN JR) 0.15 mg/0.3 mL auto-injector Inject 0.3 mLs (0.15 mg total) into the muscle as needed for Anaphylaxis.      sodium chloride (OCEAN) 0.65 % nasal spray Spray 1 spray into each nostril 2 times daily as needed for Congestion 45 mL 5    nystatin (MYCOSTATIN) powder apply  topically 4 times daily  to the following areas: neck, groin, axilla 60 g 0    triamcinolone (KENALOG) 0.025 % cream Apply topically 2 times daily  APPLY TO AFFECTED AREA (Patient not taking: Reported on 08/23/2023) 15 g 2     No current facility-administered medications for this visit.         Assessment and Plan:  With regards to medication management for this patient, the following aspects were reviewed: Indication, effectiveness, safety and convenience of each medication. The patient's medical conditions and medications were assessed, evaluated, and deemed meeting goals of drug therapy except as listed below:     1. Type 2 Diabetes  Currently taking Metformin 1000mg  BID + Invokana 300mg  daily + Toujeo 50 units daily    Last A1c in December was 4.9%  FPG are 115mg /dL the majority of the time. All except one value over the past 3 months has been 130mg /dL  Post prandial sugars are 130mg /dL, the majority are 120mg /dL  Based on physical reports brought in from home nurse  Continue current oral antidiabetics at current dosages  Decrease Toujeo to 20 units once nightly  Repeat A1c in March  2. Other Medication-Related Interventions: In addition to the medication changes noted above, these medication-related interventions were completed by the Pharmacist during today's visit:  disease state education + other    Plan  Recommendations were discussed with Provider (Dr. Susette Racer) during appointment. Plan for implementation of medication changes are documented in Provider's note.     Patient was able to verbalize understanding and repeat back key educational points.    Follow-up:  Next PCP visit      Patient Status: Open to [facility]     Duration of Visit: 31-45 min    Thank you for allowing me to participate in the care of this patient. Please contact me with any questions.     Abram Sander, PharmD., BCCCP  Clinical Pharmacist Practitioner, CPP  [facility]  [address], [place], [place]  [address]  [phone]

## 2023-08-29 ENCOUNTER — Encounter: Payer: Self-pay | Admitting: Student in an Organized Health Care Education/Training Program

## 2023-08-29 NOTE — Progress Notes (Signed)
Incoming paperwork for completion to the [facility]  Date Received:    Date Paperwork was Intake: 08/29/2023     Type of paperwork:   Sender/organization: [employer]   Form type: Addendum Verbal Order       Completed paperwork to be sent to:  Fax to [phone]    Placed in box for: Lawana Pai  10:45 AM  08/29/2023

## 2023-08-29 NOTE — Progress Notes (Signed)
Outgoing paperwork:    Completed paperwork received on  08/29/2023  Sent to scanning? Yes  Sent as outlined in prior documentation: Fax to [phone]    Lavetta Nielsen  2:00 PM  08/29/2023

## 2023-09-03 ENCOUNTER — Telehealth: Payer: Self-pay | Admitting: Student in an Organized Health Care Education/Training Program

## 2023-09-03 NOTE — Telephone Encounter (Signed)
 [facility] TELEPHONE INTAKE    Reason for call: [name] would like to discuss orders that were sent over last week and why they are doing what was requested    Name of caller: [name]  Relationship to patient: RN    Phone:  [phone]

## 2023-09-04 ENCOUNTER — Encounter: Payer: Self-pay | Admitting: Gastroenterology

## 2023-09-04 NOTE — Telephone Encounter (Signed)
 Called and spoke to [place] pt had a clinic appt with Dr. Susette Racer last week and the incorrect order was sent to pharmacy  Pt was previously on Ipratropium nebs 3x daily  Most recent order was written for twice daily  [name] states this frequency does not produce therapeutic results    [name] faxed altered order to office for signature from PCP  Order in PCP's mailbox for signing    Will follow up with PCP

## 2023-09-07 ENCOUNTER — Ambulatory Visit: Payer: Medicare Other | Admitting: Optometrist

## 2023-09-07 ENCOUNTER — Other Ambulatory Visit: Payer: Self-pay | Admitting: Student in an Organized Health Care Education/Training Program

## 2023-09-07 ENCOUNTER — Encounter: Payer: Self-pay | Admitting: Optometrist

## 2023-09-07 ENCOUNTER — Other Ambulatory Visit: Payer: Self-pay

## 2023-09-07 DIAGNOSIS — J4540 Moderate persistent asthma, uncomplicated: Secondary | ICD-10-CM

## 2023-09-07 DIAGNOSIS — E119 Type 2 diabetes mellitus without complications: Secondary | ICD-10-CM

## 2023-09-07 DIAGNOSIS — H5213 Myopia, bilateral: Secondary | ICD-10-CM

## 2023-09-07 DIAGNOSIS — H52223 Regular astigmatism, bilateral: Secondary | ICD-10-CM

## 2023-09-07 MED ORDER — IPRATROPIUM BROMIDE 0.02 % IN SOLN *I*
500.0000 ug | Freq: Two times a day (BID) | RESPIRATORY_TRACT | 5 refills | Status: DC
Start: 2023-09-07 — End: 2023-09-14

## 2023-09-07 NOTE — Progress Notes (Signed)
 Assessment:  DM type II x 5-10 years   - dilated fundus views difficult due to fixation today  Lab Results   Component Value Date    HA1C 4.9 06/22/2023     2. Low myopia/astigmatism OU   -pt asymptomatic     Plan:  Proper glycemic and hypertensive control advised. Pt educated on importance of yearly dilated exams.  2.  Rx printed for pt for prn use if he desires.     RTC 1 year for DM eye exam, sooner prn.

## 2023-09-07 NOTE — Telephone Encounter (Signed)
 [name] states was advised by pharmacy that 3 boxes will be needed for a 90 day supply

## 2023-09-14 MED ORDER — IPRATROPIUM BROMIDE 0.02 % IN SOLN *I*
500.0000 ug | Freq: Three times a day (TID) | RESPIRATORY_TRACT | 5 refills | Status: DC
Start: 2023-09-14 — End: 2023-10-04

## 2023-09-14 NOTE — Telephone Encounter (Signed)
 Angel Wells states script is still incorrect and needs to state 3xs daily with a 30 day supply. Please advise

## 2023-09-14 NOTE — Addendum Note (Signed)
 Addended by: Jaymes Graff on: 09/14/2023 09:09 AM     Modules accepted: Orders

## 2023-09-21 ENCOUNTER — Encounter: Payer: Self-pay | Admitting: Student in an Organized Health Care Education/Training Program

## 2023-09-21 ENCOUNTER — Other Ambulatory Visit: Payer: Self-pay | Admitting: Student in an Organized Health Care Education/Training Program

## 2023-09-21 NOTE — Telephone Encounter (Signed)
 This is something his nurse should have been calling here about and requesting some wound care orders. This wound is going to need some intervention/wound care.  Can we reach out to see what the home care team can provide - wound nurse evaluation?

## 2023-09-21 NOTE — Telephone Encounter (Signed)
This patient attachment is clinically relevant.  Please keep in the patient's chart.    [] Document  [x] Photo    Brief attachment description: leg wound  (Ex. L forearm rash, WC papers)    Thank you,  Guled Gahan Anne Giavonni Cizek, RN

## 2023-09-24 MED ORDER — COLLAGENASE 250 UNIT/GM EX OINT *I*
TOPICAL_OINTMENT | Freq: Every day | CUTANEOUS | 1 refills | Status: DC
Start: 2023-09-24 — End: 2023-10-10

## 2023-09-24 MED ORDER — ALLEVYN GENTLE EX PADS
MEDICATED_PAD | CUTANEOUS | 2 refills | Status: DC
Start: 2023-09-24 — End: 2023-10-10

## 2023-09-24 NOTE — Telephone Encounter (Signed)
 This is a wound that is not likely to clear up with just soap and water and some antibiotics.  It's got yellow exudate and hard borders    He really needs a wound care consult -- is he getting PDN or nursing from one of the home care agencies?  If he has a home care agency let's ask them to have their wound nurse see him.    If the meantime:  Clean wound once daily  Apply collagenase to wound once daily  Cover with foam dressing once daily    I sent prescriptions to the pharmacy for these last two things

## 2023-09-26 ENCOUNTER — Telehealth: Payer: Self-pay | Admitting: Student in an Organized Health Care Education/Training Program

## 2023-09-26 NOTE — Telephone Encounter (Signed)
 [name] called back to say she spoke with patient's dad and they will keep the appointment on 10/04/23 at 11am.

## 2023-09-26 NOTE — Telephone Encounter (Signed)
 [facility] TELEPHONE TRIAGE     Reason for call: [name] wants to schedule the patient for an appointment this Friday regarding a wound on his left knee.      Name of caller: [name] for [facility] Al[facility]  Relationship to patient: Nurse     Phone:  [phone]

## 2023-09-26 NOTE — Telephone Encounter (Signed)
 Called and spoke with Angel Wells requesting an appointment for wound assessment and to text a wound photo for updates  Writer declined text, asked that she have dad send a photo via mychart (as he knows how to do this and to keep wound documentation in one place)  Also asked that [name] speak with pts father to confirm the necessity of an appointment as dad declined multiple offers and Clinical research associate had just read a Clinical cytogeneticist from pts Ambulance person who also declined a need for appointment at this time  [name] will call back after speaking with dad

## 2023-09-26 NOTE — Telephone Encounter (Signed)
 This patient attachment is clinically relevant.  Please keep in the patient's chart.    []  Document  [x]  Photo    Brief attachment description: updated wound photo  (Ex. L forearm rash, WC papers)    Thank you,  Dorian Pod, RN

## 2023-09-27 ENCOUNTER — Other Ambulatory Visit: Payer: Self-pay | Admitting: Student in an Organized Health Care Education/Training Program

## 2023-10-02 ENCOUNTER — Encounter: Payer: Self-pay | Admitting: Student in an Organized Health Care Education/Training Program

## 2023-10-02 NOTE — Progress Notes (Signed)
 Incoming paperwork for completion to the [facility]  Date Received:    Date Paperwork was Intake: 10/02/2023     Type of paperwork:   Sender/organization: [employer]  Form type: revision to plan of care 08/19/23-10-17-23    Any specific/special request? no    Copy and scan before mailing? Yes    Completed paperwork to be sent to:  Fax to [phone]    Placed in box for: Jacqlyn Krauss  9:59 AM  10/02/2023

## 2023-10-03 ENCOUNTER — Other Ambulatory Visit: Payer: Self-pay | Admitting: Student in an Organized Health Care Education/Training Program

## 2023-10-03 DIAGNOSIS — Z794 Long term (current) use of insulin: Secondary | ICD-10-CM

## 2023-10-04 ENCOUNTER — Other Ambulatory Visit: Payer: Self-pay

## 2023-10-04 ENCOUNTER — Other Ambulatory Visit
Admission: RE | Admit: 2023-10-04 | Discharge: 2023-10-04 | Disposition: A | Discharge status: Home / Self Care | Source: Ambulatory Visit

## 2023-10-04 ENCOUNTER — Ambulatory Visit: Attending: Primary Care | Admitting: Primary Care

## 2023-10-04 ENCOUNTER — Encounter: Payer: Self-pay | Admitting: Gastroenterology

## 2023-10-04 VITALS — BP 128/68 | HR 78 | Temp 96.3°F | Wt 171.9 lb

## 2023-10-04 DIAGNOSIS — E119 Type 2 diabetes mellitus without complications: Secondary | ICD-10-CM | POA: Insufficient documentation

## 2023-10-04 DIAGNOSIS — L97129 Non-pressure chronic ulcer of left thigh with unspecified severity: Secondary | ICD-10-CM | POA: Insufficient documentation

## 2023-10-04 DIAGNOSIS — J4540 Moderate persistent asthma, uncomplicated: Secondary | ICD-10-CM

## 2023-10-04 MED ORDER — IPRATROPIUM BROMIDE 0.02 % IN SOLN *I*
RESPIRATORY_TRACT | 5 refills | Status: DC
Start: 2023-10-04 — End: 2023-11-05

## 2023-10-04 MED ORDER — IPRATROPIUM BROMIDE 0.02 % IN SOLN *I*
RESPIRATORY_TRACT | 5 refills | Status: DC
Start: 2023-10-04 — End: 2023-10-04

## 2023-10-04 MED ORDER — IPRATROPIUM BROMIDE 0.02 % IN SOLN *I*
RESPIRATORY_TRACT | 0 refills | Status: DC
Start: 2023-10-04 — End: 2023-10-04

## 2023-10-05 LAB — HEMOGLOBIN A1C: Hemoglobin A1C: 4.9 %

## 2023-10-10 NOTE — Progress Notes (Signed)
 Outgoing paperwork:    Completed paperwork received on  10/10/2023  Sent to scanning? Yes  Sent as outlined in prior documentation: Fax to [phone]    [name]  5:21 PM  10/10/2023

## 2023-10-10 NOTE — Addendum Note (Signed)
 Addended by: Forbes Cellar on: 10/10/2023 07:51 AM     Modules accepted: Level of Service

## 2023-10-10 NOTE — Progress Notes (Signed)
 [facility]    OFFICE VISIT NOTE  SUBJECTIVE   Left thigh wound  Here with his visiting nurse to follow-up on left thigh/knee wound  Sent pictures last week because of a wound on his left thigh (see photo in media dated 3/14).  At that time recommended use of collagenase and dressing  Follow-up photo sent 5 days later (again in media dated 3/19) that showed decreasing size  Here today to follow-up-in the last 5 days there has been continued decrease in the size of the wound.    Has not been using collagenase on it - just cleaning with soap and water and covering intermittently    Type 2 diabetes  Currently managed on once daily insulin, Invokana, and Metformin  Checking BGs 4 times a day - always less then 200       Current Outpatient Medications   Medication Sig    Multiple Vitamins-Minerals (MULTIVITAMIN GUMMIES ADULTS PO) Take by mouth daily. 2 gummies one a day nutritional supplement    levETIRAcetam (KEPPRA) 100 mg/mL solution give 15 milliliter by g-tube twice daily    fluticasone (FLONASE) 50 MCG/ACT nasal spray spray 1 spray into nostril daily    ANTACID ULTRA STRENGTH 1000 MG chewable tablet place 1 tablet into mouth, chew and swallow daily.    famotidine (PEPCID) 20 mg tablet take 1 tablet by mouth 2 (two) times daily. ok to crush it and put it down tube if necessary    INVOKANA 300 MG tablet take 1 tablet by mouth daily (before breakfast).    metFORMIN (GLUCOPHAGE) 500 MG/5ML solution administer 10 mls (1,000 milligram total) via g tube 2 times daily (with meals)    carbamide peroxide (DEBROX) 6.5 % otic solution use 5 drops twice daily on the first 3 days of each month.    lacosamide (VIMPAT) 10 mg/mL SOLN oral solution Take 10 MILLILITER per tube twice daily. ; MAXIMUM DAILY DOSE 20 MILLILITER    insulin glargine 2 unit dial (TOUJEO MAX SOLOSTAR) 300 UNIT/ML injection pen inject 50 units into the skin nightly for type 2 diabetes. discard each pen 56 days after first use. (Patient  taking differently: Inject 20 units into the skin nightly for Type 2 Diabetes. Discard each pen 56 days after first use.)    Docusate Sodium (SILACE) 150 MG/15ML syrup take 10 mls (100 milligram total) by mouth 3 times daily hold for loose stool    cetirizine (ZYRTEC) 10 mg tablet Administer 1 tablet (10 mg total) via G tube nightly.    valproate (VALPROIC ACID) 50 mg/mL oral solution take 5 milliliter by mouth 3 times daily    baclofen (LIORESAL) 10 mg tablet take 1 tablet (10 milligram total) by mouth 2 times daily    azelastine (ASTELIN) 0.1 % nasal spray Apply 2 sprays in each nostril every morning and then in the afternoon as needed    budesonide (PULMICORT) 0.25 mg/43mL nebulizer suspension INHALE 2 ML (CONTENT OF 1 VIAL) VIA NEBULIZER 2 TIMES DAILY FOR ASTHMA    Probiotic Product (PROBIOTIC GUMMIES) 30 MG CHEW Take 30 mg by mouth daily. 2  tablets/gummies as provided by family-digestive health    SENNA 8.6 MG tablet administer 2 tablets via g tube daily    Ascorbic Acid (VITAMIN C ADULT GUMMIES PO) Take 250 mg by mouth daily  Give 2 gummies once daily     ipratropium (ATROVENT) 0.02 % nebulizer solution NEBULIZE 1 UNIT DOSE THREE TIMES A DAY, MAY GIVE 1  ADDITIONAL DOSE ONCE DAILY IS NEEDED FOR WHEEZING/COUGH/RESPIRATORY DISTRESS   DISPENSE: 120 UNIT DOSES  BILL TO MCR PART B, DIAGNOSIS CODE:  J45.909    B-D UF III MINI PEN NEEDLES 31G X 5 MM USE 3 (THREE) TIMES DAILY AS NEEDED.    guaifenesin (HUMIBID E) 400 mg TABS take 1 tablet by mouth 3 times daily    Glucagon (GVOKE HYPOPEN 1-PACK) 1 MG/0.2ML SOAJ for Disorder with Low Blood Sugar. Use as needed for blood glucose < 70    Glucose Blood (ACCU-CHEK GUIDE) test strip USE 1 STRIP 3 TIMES A DAY    EPINEPHrine (EPIPEN) 0.3 mg/0.3 mL auto-injector use as directed    albuterol (PROVENTIL) (2.5 mg/68mL) 0.083% nebulizer solution INHALE 3 MLS BY NEBULIZATION 4 TIMES DAILY FOR SPASM OF LUNG AIR PASSAGES WITH VESTING. WHEN ILL, USE FOUR TIMES A DAY WITH VESTING.     bisacodyl (DULCOLAX) 10 mg suppository place 1 suppository rectally every other day and as needed if pt has not had a bowel movement in 2 days    chlorhexidine (PERIDEX) 0.12 % solution use as directed    clonazePAM (KLONOPIN) 1 mg tablet take 1 tablet by mouth twice daily as needed (crush for g-tube) for seizures; maximum daily dose 2 tabs    nystatin (MYCOSTATIN) ointment apply topically with each diaper change as needed for diaper rash. to the following areas: gtube site after mild cleansing    lactobacillus rhamnosus, GG, (CULTURELLE) capsule Take 2 capsules (2 each total) by mouth daily.    sodium chloride 0.9 % nebulizer solution Inhale 5 mLs by nebulization 3 times daily    Lactulose 20 gm/65mL solution administer 30 mls (20 g total) via g tube daily as needed (no stool in 48 hours) use as directed    EPINEPHrine (EPIPEN JR) 0.15 mg/0.3 mL auto-injector Inject 0.3 mLs (0.15 mg total) into the muscle as needed for Anaphylaxis.    Accu-Chek FastClix Lancets MISC 1 EACH BY MISC.(NON-DRUG COMBO ROUTE) ROUTE 3 (THREE) TIMES DAILY. E11.65    sodium chloride (OCEAN) 0.65 % nasal spray Spray 1 spray into each nostril 2 times daily as needed for Congestion    nystatin (MYCOSTATIN) powder apply topically 4 times daily  to the following areas: neck, groin, axilla    generic DME Item to dispense: Bivona silicone cuffed trach tube size 7 80 mm length  Instructions for use: Use as directed    generic DME Vent settings:  SIMV/PC/PS, RATE 10 IP 21. PS 13, PEEP 7, HIGH PRESSURE 50, LOW PRESSURE 5, HIGH RR 40, LOW RR14    oxygen 1-2 L/min to keep O2 sat above 92%, may increase up to 4 lpm as needed.  Low saturation alarm 85%, high alarm off, low HR 50, High HR 200    generic DME Hill Rom Percussion Vest Model # 105  55inch  Use as directed.    cholecalciferol (VITAMIN D) 1,000 unit tablet Take 1 tablet (1,000 units total) by mouth daily. 2 chews or tablet via G-tube- for total dose of 2,000 units.    MIC-KEY gastrostomy kit  14FR Please dispense every 3 months and prn, MicKey 14Fr 2.5cm    triamcinolone (KENALOG) 0.025 % cream Apply topically 2 times daily  APPLY TO AFFECTED AREA (Patient not taking: Reported on 09/07/2023)    MIC-KEY gastrostomy feeding tube extension set 24" Use as directed, please dispense 4 per month    insulin pen needle (FIFTY50 PEN NEEDLES) 31G X 8 MM 1 each  Medications reviewed at today's visit    OBJECTIVE   PHYSICAL EXAM  BP 128/68 (BP Location: Right arm, Patient Position: Sitting, Cuff Size: adult)   Pulse 78   Temp 35.7 C (96.3 F) (Temporal)   Wt 78 kg (171 lb 14.4 oz)   SpO2 96% Comment: room air  BMI 30.45 kg/m   GEN:  Alert, no distress  CV: RRR, normal S1/S2, no murmurs   PULM: no increased work of breathing, clear to ascultation bilaterally  ABD:   Normal bowel sounds, soft, non-tender, no rebound or guarding, no HSM.   EXT: No edema  SKIN:   1/2 cm x 1/2 cm open wound with central eschar.  Minimal surrounding erythema    ASSESSMENT       ICD-10-CM ICD-9-CM   1. Skin ulcer of left thigh  L97.129 707.11   2. Type 2 diabetes mellitus  E11.9 250.00       Health Maintenance Due   Topic Date Due    HIV Screening [place]  Never done    IMM-Hepatitis B Vaccine (1 of 3 - 19+ 3-dose series) Never done       PLAN   Left thigh skin ulcer  Reportedly started off as a blistering lesion currently healing nicely with local care.  Expect this will heal completely over the next few weeks    Type 2 diabetes  Continue current treatment-working with his PCP and Pharm.D. here gradually lower his once daily insulin dose as able      Orders Placed This Encounter    ipratropium (ATROVENT) 0.02 % nebulizer solution       Follow up in about 2 months (around 12/04/2023).      Marchia Meiers, MD

## 2023-10-17 ENCOUNTER — Encounter: Payer: Self-pay | Admitting: Gastroenterology

## 2023-10-17 ENCOUNTER — Encounter: Payer: Self-pay | Admitting: Student in an Organized Health Care Education/Training Program

## 2023-10-17 NOTE — Progress Notes (Signed)
 Incoming paperwork for [facility] to the [facility]  Date Received:    Date Paperwork was Intake: 10/17/2023     Type of paperwork:   Sender/organization: [employer]   Form type: POC      Completed paperwork to be sent to:  Fax to [phone]    Placed in box for: Lawana Pai  1:34 PM  10/17/2023

## 2023-10-18 ENCOUNTER — Other Ambulatory Visit: Payer: Self-pay | Admitting: Student in an Organized Health Care Education/Training Program

## 2023-10-18 DIAGNOSIS — J4540 Moderate persistent asthma, uncomplicated: Secondary | ICD-10-CM

## 2023-10-18 NOTE — Progress Notes (Signed)
 Outgoing paperwork:    Completed paperwork received on  10/18/2023  Sent to scanning? Yes  Sent as outlined in prior documentation: Fax to [phone]    Lavetta Nielsen  3:17 PM  10/18/2023

## 2023-10-24 ENCOUNTER — Other Ambulatory Visit: Payer: Self-pay | Admitting: Student in an Organized Health Care Education/Training Program

## 2023-10-24 ENCOUNTER — Other Ambulatory Visit: Payer: Self-pay | Admitting: Neurology

## 2023-10-24 DIAGNOSIS — K219 Gastro-esophageal reflux disease without esophagitis: Secondary | ICD-10-CM

## 2023-10-24 DIAGNOSIS — G40219 Localization-related (focal) (partial) symptomatic epilepsy and epileptic syndromes with complex partial seizures, intractable, without status epilepticus: Secondary | ICD-10-CM

## 2023-10-25 ENCOUNTER — Other Ambulatory Visit: Payer: Self-pay | Admitting: Student in an Organized Health Care Education/Training Program

## 2023-10-25 DIAGNOSIS — K219 Gastro-esophageal reflux disease without esophagitis: Secondary | ICD-10-CM

## 2023-10-26 ENCOUNTER — Encounter: Payer: Self-pay | Admitting: Student in an Organized Health Care Education/Training Program

## 2023-10-26 NOTE — Progress Notes (Signed)
 Incoming paperwork for completion to the [facility]  Date Received:    Date Paperwork was Intake: 10/26/2023     Type of paperwork:   Sender[facility]organization: [facility]  Form type: Detailed Written Order - 12 Items    Completed paperwork to be sent to:  Fax to [phone]    Placed in box for:  Cherl Corner  1:42 PM  10/26/2023

## 2023-10-30 ENCOUNTER — Encounter: Payer: Self-pay | Admitting: Gastroenterology

## 2023-11-03 ENCOUNTER — Other Ambulatory Visit: Payer: Self-pay | Admitting: Primary Care

## 2023-11-05 ENCOUNTER — Encounter: Payer: Self-pay | Admitting: Student in an Organized Health Care Education/Training Program

## 2023-11-08 ENCOUNTER — Other Ambulatory Visit: Payer: Self-pay | Admitting: Oncology

## 2023-11-08 ENCOUNTER — Other Ambulatory Visit: Payer: Self-pay | Admitting: Student in an Organized Health Care Education/Training Program

## 2023-11-08 ENCOUNTER — Encounter: Payer: Self-pay | Admitting: Student in an Organized Health Care Education/Training Program

## 2023-11-08 ENCOUNTER — Other Ambulatory Visit: Payer: Self-pay

## 2023-11-08 NOTE — Telephone Encounter (Addendum)
 Returned call to [facility] regarding equipment issue. Message left for return to me. [name]'s cough assist has died. Order sent to [facility] ([facility]) for replacement.

## 2023-11-08 NOTE — Progress Notes (Signed)
 Incoming paperwork for completion to the [facility]  Date Received:    Date Paperwork was Intake: 11/08/2023     Type of paperwork:   Sender/organization: [facility]  Form type:  Detailed Written Order - 17 Items       Completed paperwork to be sent to:  Fax to [phone]    Placed in box for: Angel Wells  2:59 PM  11/08/2023

## 2023-11-09 ENCOUNTER — Other Ambulatory Visit: Payer: Self-pay

## 2023-11-09 ENCOUNTER — Encounter: Payer: Self-pay | Admitting: Gastroenterology

## 2023-11-09 NOTE — Telephone Encounter (Signed)
 Patient Demographic Information [facility])       [facility] First Name Last Name Birth Date Gender Street Address [facility] Zip Code   A Angel Wells [birth year] Male [address] [place] [address]   Search:  [facility] My Rx Current Rx Drug Type Rx Written Rx Dispensed Drug Quantity Days Supply Prescriber Name Prescriber [facility] # Payment Method Dispenser   A U Y  06/21/2023 10/25/2023 lacosamide  10 mg/ml solution 600 30 Rolan Curtistine HERO (MD) [id number] Insurance [facility]   A U N  06/21/2023 09/27/2023 lacosamide  10 mg/ml solution 600 30 Rolan Curtistine HERO (MD) [id number] Insurance [facility]   A U N  06/21/2023 08/30/2023 lacosamide  10 mg/ml solution 600 30 Rolan Curtistine HERO (MD) [id number] Insurance [facility]   A U N  06/21/2023 07/26/2023 lacosamide  10 mg/ml solution 600 30 Rolan Curtistine HERO (MD) [id number] Insurance [facility]   A U N  06/21/2023 06/27/2023 lacosamide  10 mg/ml solution 600 30 Rolan Curtistine HERO (MD) [id number] Insurance [facility]   A U N  12/07/2022 05/29/2023 lacosamide  10 mg/ml solution 600 30 Rolan Curtistine HERO (MD) [id number] Insurance [facility]   A U N  12/07/2022 05/01/2023 lacosamide  10 mg/ml solution 600 30 Rolan Curtistine HERO (MD) [id number] Insurance [facility]   A U N O 04/04/2023 04/04/2023 oxycodone  hcl 5 mg/5 ml soln 20ml 1 [facility] [id number] Other [facility]   A U N  12/07/2022 03/30/2023 lacosamide  10 mg/ml solution 600 30 Rolan Curtistine HERO (MD) [id number] Insurance [facility]   A U N  12/07/2022 02/27/2023 lacosamide  10 mg/ml solution 600 30 Rolan Curtistine HERO (MD) [id number] Insurance [facility]   A U N  12/07/2022 01/29/2023 lacosamide  10 mg/ml solution 600 30 Rolan Curtistine HERO (MD) [id number] Insurance [facility]   A U N B 01/04/2023 01/05/2023 clonazepam  1 mg tablet 10 5 Lawernce Fallow [id number] Insurance [facility]   A U N  12/07/2022 12/18/2022 lacosamide  10 mg/ml solution 600 30 Rolan Curtistine HERO (MD) [id number] Insurance [facility]   A U N  06/16/2022 11/17/2022 lacosamide  10 mg/ml solution 600 30 Rolan Curtistine HERO (MD) [id number] Insurance [facility]

## 2023-11-13 NOTE — Progress Notes (Signed)
 Outgoing paperwork:    Completed paperwork received on  11/13/2023  Sent to scanning? Yes  Sent as outlined in prior documentation: Fax to [phone]     Jill Motes  2:29 PM  11/13/2023

## 2023-11-16 ENCOUNTER — Ambulatory Visit
Attending: Student in an Organized Health Care Education/Training Program | Admitting: Student in an Organized Health Care Education/Training Program

## 2023-11-16 ENCOUNTER — Other Ambulatory Visit: Payer: Self-pay

## 2023-11-16 VITALS — BP 114/63 | HR 88 | Temp 97.3°F | Wt 169.2 lb

## 2023-11-16 DIAGNOSIS — Z794 Long term (current) use of insulin: Secondary | ICD-10-CM | POA: Insufficient documentation

## 2023-11-16 DIAGNOSIS — G40909 Epilepsy, unspecified, not intractable, without status epilepticus: Secondary | ICD-10-CM | POA: Insufficient documentation

## 2023-11-16 DIAGNOSIS — Z931 Gastrostomy status: Secondary | ICD-10-CM | POA: Insufficient documentation

## 2023-11-16 DIAGNOSIS — S069XAA Unspecified intracranial injury with loss of consciousness status unknown, initial encounter: Secondary | ICD-10-CM | POA: Insufficient documentation

## 2023-11-16 DIAGNOSIS — E1165 Type 2 diabetes mellitus with hyperglycemia: Secondary | ICD-10-CM | POA: Insufficient documentation

## 2023-11-16 DIAGNOSIS — J9611 Chronic respiratory failure with hypoxia: Secondary | ICD-10-CM | POA: Insufficient documentation

## 2023-11-16 DIAGNOSIS — Z9189 Other specified personal risk factors, not elsewhere classified: Secondary | ICD-10-CM | POA: Insufficient documentation

## 2023-11-16 MED ORDER — CLOBETASOL PROPIONATE 0.05 % EX OINT *I*
TOPICAL_OINTMENT | CUTANEOUS | 1 refills | Status: DC
Start: 2023-11-16 — End: 2023-12-28

## 2023-11-16 NOTE — Progress Notes (Signed)
 [facility]    Visit Note     REASON FOR VISIT      Chief Complaint   Patient presents with    Follow-up     Has irritation to L ear. Would like to discuss. Would like dermatology referral considered. Brought list of BG's. Need miconazole rx.   Having issues with DME supplier. 60ml syringes and paper tape are needed. Will need visit notes from today to support this.   Mikey needs order.  Will need a PT referral in Aug for [facility].       PRIMARY DIAGNOSIS      TBI    Subjective     SUBJECTIVE      Diabetes type 2  - continues to do well based on Bgs provided  - no concerns for frequent hypoglycemia  - currently on 20units of long acting insulin   - not needing any sort acting insulin  at this time    Rash behind L ear  - has been going on awhile, but has worsened recently  - seems to bother him when its evaluated or messed with  - no fevers or discharge  - described as irritate with yellow flakes/scales  - not currently using anything specific topically  - denies fever, trauma to the area  - no new detergents or lotions     Feeding by gtube  - due for Mic key refill along with tubing supplies  - [phone]  - [facility]   - needs paper scripts faxed  - currently also needs 60ml syringes for gravity medication administration     ROS  Review of Systems as per HPI above    Past Medical History, Social History, Family History, and Medications/allergies reviewed during this visit    Current Outpatient Medications   Medication    chlorhexidine  (PERIDEX ) 0.12 % solution    ipratropium (ATROVENT ) 0.02 % nebulizer solution    famotidine  (PEPCID ) 20 mg tablet    budesonide  (PULMICORT ) 0.25 mg/2mL nebulizer suspension    Multiple Vitamins-Minerals (MULTIVITAMIN GUMMIES ADULTS PO)    fluticasone  (FLONASE ) 50 MCG/ACT nasal spray    ANTACID ULTRA STRENGTH 1000 MG chewable tablet    INVOKANA  300 MG tablet    carbamide peroxide (DEBROX) 6.5 % otic solution    guaifenesin  (HUMIBID E) 400 mg TABS    Glucose  Blood (ACCU-CHEK GUIDE) test strip    lacosamide  (VIMPAT ) 10 mg/mL SOLN oral solution    cetirizine  (ZYRTEC ) 10 mg tablet    albuterol  (PROVENTIL ) (2.5 mg/35mL) 0.083% nebulizer solution    valproate (VALPROIC ACID ) 50 mg/mL oral solution    lactobacillus rhamnosus, GG, (CULTURELLE) capsule    sodium chloride  0.9 % nebulizer solution    Probiotic Product (PROBIOTIC GUMMIES) 30 MG CHEW    Accu-Chek FastClix Lancets MISC    SENNA 8.6 MG tablet    generic DME    generic DME    generic DME    cholecalciferol (VITAMIN D ) 1,000 unit tablet    triamcinolone  (KENALOG ) 0.025 % cream    Ascorbic Acid (VITAMIN C ADULT GUMMIES PO)    insulin  pen needle (FIFTY50 PEN NEEDLES) 31G X 8 MM    miconazole (MICATIN) 2 % powder    baclofen  (LIORESAL ) 10 mg tablet    MIC-KEY gastrostomy kit 14FR    MIC-KEY gastrostomy feeding tube extension set 24"    Docusate Sodium  (SILACE) 150 MG/15ML syrup    levETIRAcetam  (KEPPRA ) 100 mg/mL solution    metFORMIN  (GLUCOPHAGE )  500 MG/5ML solution    nystatin  (MYCOSTATIN ) ointment    clobetasol  (TEMOVATE ) 0.05 % ointment    clonazePAM  (KLONOPIN ) 1 mg tablet    nystatin  (MYCOSTATIN ) powder    B-D UF III MINI PEN NEEDLES 31G X 5 MM    Glucagon  (GVOKE HYPOPEN  1-PACK) 1 MG/0.2ML SOAJ    EPINEPHrine  (EPIPEN ) 0.3 mg/0.3 mL auto-injector    bisacodyl  (DULCOLAX) 10 mg suppository    azelastine  (ASTELIN ) 0.1 % nasal spray    Lactulose 20 gm/30mL solution    EPINEPHrine  (EPIPEN  JR) 0.15 mg/0.3 mL auto-injector    sodium chloride  (OCEAN) 0.65 % nasal spray    oxygen      No current facility-administered medications for this visit.          Objective     OBJECTIVE      BP 114/63   Pulse 88   Temp 36.3 C (97.3 F) (Temporal)   Wt 76.7 kg (169 lb 3.2 oz)   SpO2 97%   BMI 29.97 kg/m     Physical Exam  Constitutional:       General: He is not in acute distress.     Appearance: He is not ill-appearing.      Comments: Sitting comfortably in wheelchair    HENT:      Head: Atraumatic.      Nose: No congestion.       Mouth/Throat:      Mouth: Mucous membranes are moist.   Eyes:      General: No scleral icterus.     Extraocular Movements: Extraocular movements intact.      Conjunctiva/sclera: Conjunctivae normal.   Pulmonary:      Effort: Pulmonary effort is normal. No respiratory distress.   Musculoskeletal:      Comments: Increased tone in all extremities    Skin:     General: Skin is warm and dry.      Findings: Rash (Rash with yellow scale and underlying erythema behind left ear without swelling, underlying fluctuance/tenderness, or discharge) present.   Neurological:      Mental Status: He is alert. Mental status is at baseline.                ASSESSMENT / DIAGNOSIS     1. Type 2 diabetes mellitus with hyperglycemia, with long-term current use of insulin   - with continued control of his sugars, and consistently appropriate hemoglobin A1C, we can discontinue long acting insulin   - I suspect he will require an additional oral medication, but we will see how his sugars respond to being off long acting insulin   - discussed he may require an increase in mealtime insulin  as a result of stopping long acting insulin , which will help guide us  in deciding on adding oral medication     2. Gastrostomy in place, TBI, chronic respiratory failure with hypoxia at risk for aspiration  - he continues to require gtube for feedings and medication administration  - needs 60ml syringes in order to give medications through gravity  - MIC-KEY gastrostomy feeding tube extension set 24"; Use as directed, please dispense 4 per month  Dispense: 4 kit; Refill: 5    3. Rash posterior to L ear  - presentation is most consistent with seborrheic dermatitis with thick yellow scale and underlying erythema, there is some signs of it in his scalp though to a lesser degree  - without fever, pain on palpation, or underlying fluctuance low concern this is related to underlying infection or malignant otitis externa  -  start clobetasol  ointment on rash, would not  use for more than 2 weeks at a time. If no improvement can consider additional antifungal therapies       Orders Placed This Encounter    clobetasol  (TEMOVATE ) 0.05 % ointment    MIC-KEY gastrostomy kit 14FR    MIC-KEY gastrostomy feeding tube extension set 24"       There are no Patient Instructions on file for this visit.       --Patient instructed to call if symptoms are not improving or worsening  --Follow-up arranged  Follow up in about 4 months (around 03/18/2024).     I personally spent 30 minutes on the calendar day of the encounter, including pre and post visit work reviewing the EMR and management of this patient.     Electronically signed by Abner Ables, DO at 12:12 PM on 11/28/2023     [facility], Phone: [phone]

## 2023-11-20 ENCOUNTER — Other Ambulatory Visit: Payer: Self-pay | Admitting: Student in an Organized Health Care Education/Training Program

## 2023-11-22 ENCOUNTER — Other Ambulatory Visit: Payer: Self-pay | Admitting: Student in an Organized Health Care Education/Training Program

## 2023-11-23 ENCOUNTER — Telehealth: Payer: Self-pay | Admitting: Student in an Organized Health Care Education/Training Program

## 2023-11-23 ENCOUNTER — Encounter: Payer: Self-pay | Admitting: Student in an Organized Health Care Education/Training Program

## 2023-11-23 NOTE — Telephone Encounter (Signed)
 Spoke with Polly Brink notified that clobetasole ointment that was prescribed is only meant for no longer than 2 weeks as stated in order. Ok to hold use since area resolved. Polly Brink says that she would like this discontinued.     Polly Brink also mentions that she would like miconazole refill called into [facility]. Says that she would like to have some of this available in case it is needed.     She also wanted PCP and Pharm D to looks at recent BG's on pt.  This image is in a [employer].

## 2023-11-23 NOTE — Telephone Encounter (Signed)
 [facility] TELEPHONE INTAKE    Reason for call: Polly Brink requesting a cal back about pictures she sent on my chart  of patient's ear and glucose numbers, requesting a discontinue order for the ear cream, also requesting a call back from [place] to see if any changes are needed.    Name of caller:   Relationship to patient: care giver  Organization:   Phone:  [phone]  Fax:

## 2023-11-25 ENCOUNTER — Encounter: Payer: Self-pay | Admitting: Student in an Organized Health Care Education/Training Program

## 2023-11-26 ENCOUNTER — Encounter: Payer: Self-pay | Admitting: Student in an Organized Health Care Education/Training Program

## 2023-11-26 ENCOUNTER — Other Ambulatory Visit: Payer: Self-pay | Admitting: Student in an Organized Health Care Education/Training Program

## 2023-11-26 DIAGNOSIS — M2450 Contracture, unspecified joint: Secondary | ICD-10-CM

## 2023-11-28 ENCOUNTER — Other Ambulatory Visit: Payer: Self-pay | Admitting: Student in an Organized Health Care Education/Training Program

## 2023-11-28 ENCOUNTER — Telehealth: Payer: Self-pay | Admitting: Student in an Organized Health Care Education/Training Program

## 2023-11-28 MED ORDER — MIC-KEY GASTROSTOMY BOLUS FEED EXTENSION 24IN 60CM MISC *A*
5 refills | Status: AC
Start: 2023-11-28 — End: ?

## 2023-11-28 MED ORDER — MIC-KEY GASTROSTOMY KIT 14FR MISC *A*
3 refills | Status: AC
Start: 2023-11-28 — End: ?

## 2023-11-28 MED ORDER — MICONAZOLE NITRATE 2 % EX POWD *I*
Freq: Two times a day (BID) | CUTANEOUS | 0 refills | Status: AC | PRN
Start: 2023-11-28 — End: ?

## 2023-11-28 NOTE — Telephone Encounter (Signed)
 Angel Wells reports that she has been applying PRN nystatin  to axilla, says that this area has improved. Also using warm wet compresses, then drying area. Miconazole  has been ordered and she will consider using this to see if area improves. Says that area looks like yeast rash. Says that she will send an updated picture and will call back is area worsens.

## 2023-11-28 NOTE — Telephone Encounter (Signed)
 Discussed that PCP would like pt to have BG's monitored a bit longer before starting any other oral medications. Polly Brink voiced understanding of this plan. Polly Brink says that she will be away for the first 10 days of June. She says that we can call her with any updates. Writer notified Polly Brink that we can speak with caregiver that is covering for her while she is away. If she has questions she can call office when she returns from vacation. Notified to call back for questions or concerns prior to next visit. Understanding verbalized.     Hulan Maffucci, RN  11/28/2023  2:57 PM

## 2023-11-28 NOTE — Telephone Encounter (Signed)
 Garnet Just  [facility]    11/28/23 12:00 PM  Note      Please reference Patient Advice message for more information regarding this telephone encounter.     Angel Wells is calling to find out the outcome from the MyChart message that was sent on 5/18  Please advise     Phone: [phone]

## 2023-11-28 NOTE — Telephone Encounter (Signed)
 Please reference Patient Advice message for more information regarding this telephone encounter.    Angel Wells is calling to find out the outcome from the MyChart message that was sent on 5/18  Please advise    Phone: [phone]

## 2023-11-28 NOTE — Telephone Encounter (Signed)
 Erroneous Encounter. Please Disregard.

## 2023-11-29 ENCOUNTER — Encounter: Payer: Self-pay | Admitting: Student in an Organized Health Care Education/Training Program

## 2023-11-29 NOTE — Progress Notes (Addendum)
 Incoming paperwork for completion to the [facility]  Date Received:    Date Paperwork was Intake: 11/29/2023     Type of paperwork:   Sender/organization: [employer]  Form type: Script for Mic-key gastrostomy Kit/Feeding Tube Extension      Completed paperwork to be sent to:  Fax to [phone]    Placed in box for: Gerrie Krebs  9:26 AM  11/29/2023

## 2023-11-29 NOTE — Progress Notes (Signed)
 Outgoing paperwork:    Completed paperwork received on  11/29/2023  Sent to scanning? No  Sent as outlined in prior documentation: Fax to [phone]    Jill Motes  9:27 AM  11/29/2023

## 2023-11-30 ENCOUNTER — Telehealth: Payer: Self-pay | Admitting: Student in an Organized Health Care Education/Training Program

## 2023-11-30 ENCOUNTER — Encounter: Payer: Self-pay | Admitting: Student in an Organized Health Care Education/Training Program

## 2023-11-30 ENCOUNTER — Other Ambulatory Visit: Payer: Self-pay | Admitting: Student in an Organized Health Care Education/Training Program

## 2023-11-30 NOTE — Telephone Encounter (Signed)
 [facility]  Prior Authorization Request    Date of request: 11/30/2023  Time of call: 11:51 AM    Medication Requiring PA:miconazole  (MICATIN) 2 % powder     Directions(dosing and frequency): Apply topically 2 times daily as needed. - Topical     Quantity/ Day Supply:71 g     Dx code:B37.2/112.9    Request received from:[facility] PA Source: Pharmacy    Name and Phone Number of Pharmacy:[facility], [place] - [place], [place] - [address] [facility]  [address], [place] [address]  Phone: [phone]  Fax: [phone]     Insurance coverage confirmed: yes    Tax ID: [id number]    Provider and NPI: [id number]    Additional Justification:n/a    Key ID: [id number]    Please provide justification for PA submission & urgency or indicate which covered alternative was ordered    This submission is for continuation of pain management therapy on which the patient has been stable and prevents hospitalization for the manifestations of underlying sickle cell disease.  Per the CDC statement in April 2019 opioid therapy is clinically indicated in the care of patients with sickle cell disease and their recommendations should NOT be utilized to deny these patients access to opioid therapy.  Lack of access to this medication will result in increased hospital utilization, increase risk of significant morbidity and mortality.

## 2023-11-30 NOTE — Telephone Encounter (Signed)
Sent to PCP for review

## 2023-11-30 NOTE — Telephone Encounter (Signed)
 error

## 2023-11-30 NOTE — Addendum Note (Signed)
 Addended by: Ferrel Hsu on: 11/30/2023 02:55 PM     Modules accepted: Orders

## 2023-11-30 NOTE — Telephone Encounter (Signed)
 [facility] TELEPHONE INTAKE    Reason for call: Polly Brink would like to speak with nurse/PCP regarding patient's rash and that miconazole  (MICATIN) 2 % powder was ordered and not the ointment    Name of caller: Polly Brink  Relationship to patient: RN    Phone:  [phone]

## 2023-11-30 NOTE — Telephone Encounter (Signed)
 Angel Wells called back and stated she will need to speak with nurse/PCP before end of day  Also, Angel Wells states the request if for miconazole  powder and also the ointment/creem    Phone: [phone]

## 2023-12-04 ENCOUNTER — Other Ambulatory Visit: Payer: Self-pay | Admitting: Student in an Organized Health Care Education/Training Program

## 2023-12-04 NOTE — Telephone Encounter (Signed)
 Private nurse called as they did not have any miconazole  yet on 5/24 at 913am.  Shared I could not do much about the PA over the weekend, but advised that miconazole  was over the counter and recommended they obtain this. Encouraged to call if they encountered any difficulty.     Barnett Booty, MD

## 2023-12-05 ENCOUNTER — Other Ambulatory Visit: Payer: Self-pay | Admitting: Student in an Organized Health Care Education/Training Program

## 2023-12-05 ENCOUNTER — Encounter: Payer: Self-pay | Admitting: Student in an Organized Health Care Education/Training Program

## 2023-12-05 ENCOUNTER — Telehealth: Payer: Self-pay | Admitting: Student in an Organized Health Care Education/Training Program

## 2023-12-05 NOTE — Telephone Encounter (Signed)
 ERROR

## 2023-12-05 NOTE — Telephone Encounter (Signed)
This patient attachment is clinically relevant.  Please keep in the patient's chart.    [] Document  [x] Photo    Brief attachment description: skin concerns  (Ex. L forearm rash, WC papers)    Thank you,  Lindon Kiel Anne Dannie Woolen, RN

## 2023-12-05 NOTE — Telephone Encounter (Signed)
 [facility] TELEPHONE INTAKE    Reason for call: Polly Brink states patient's skin is still not healing  Polly Brink would like patient to be seen as soon as possible  Appointment scheduled for tomorrow 5/29 @ 3:30 pm with provider    Name of caller: Polly Brink  Relationship to patient: RN     Phone:  [phone]

## 2023-12-06 ENCOUNTER — Ambulatory Visit
Attending: Student in an Organized Health Care Education/Training Program | Admitting: Student in an Organized Health Care Education/Training Program

## 2023-12-06 ENCOUNTER — Other Ambulatory Visit: Payer: Self-pay

## 2023-12-06 ENCOUNTER — Other Ambulatory Visit: Payer: Self-pay | Admitting: Student in an Organized Health Care Education/Training Program

## 2023-12-06 VITALS — BP 107/58 | HR 86 | Temp 96.6°F | Wt 172.0 lb

## 2023-12-06 DIAGNOSIS — L219 Seborrheic dermatitis, unspecified: Secondary | ICD-10-CM | POA: Insufficient documentation

## 2023-12-06 DIAGNOSIS — J4540 Moderate persistent asthma, uncomplicated: Secondary | ICD-10-CM | POA: Insufficient documentation

## 2023-12-06 DIAGNOSIS — B372 Candidiasis of skin and nail: Secondary | ICD-10-CM | POA: Insufficient documentation

## 2023-12-06 DIAGNOSIS — E119 Type 2 diabetes mellitus without complications: Secondary | ICD-10-CM | POA: Insufficient documentation

## 2023-12-06 MED ORDER — ZINC OXIDE 40 % EX PSTE *I*
PASTE | CUTANEOUS | 1 refills | Status: DC | PRN
Start: 2023-12-06 — End: 2023-12-06

## 2023-12-06 MED ORDER — ZINC OXIDE 40 % EX PSTE *I*
PASTE | Freq: Two times a day (BID) | CUTANEOUS | 1 refills | Status: DC
Start: 2023-12-06 — End: 2024-05-30

## 2023-12-06 MED ORDER — AZELASTINE HCL 137 MCG/SPRAY NA SOLN *I*
NASAL | 0 refills | Status: DC
Start: 2023-12-06 — End: 2024-01-07

## 2023-12-06 MED ORDER — KETOCONAZOLE 2 % EX CREA *I*
TOPICAL_CREAM | Freq: Two times a day (BID) | CUTANEOUS | 0 refills | Status: DC
Start: 2023-12-06 — End: 2023-12-24

## 2023-12-06 MED ORDER — BUDESONIDE 0.25 MG/2ML IN SUSP *I*
0.2500 mg | Freq: Two times a day (BID) | RESPIRATORY_TRACT | 5 refills | Status: AC
Start: 2023-12-06 — End: ?

## 2023-12-06 MED ORDER — GUAIFENESIN 400 MG PO TABS *I*
400.0000 mg | ORAL_TABLET | Freq: Three times a day (TID) | ORAL | 0 refills | Status: DC
Start: 2023-12-06 — End: 2024-02-06

## 2023-12-10 ENCOUNTER — Telehealth: Payer: Self-pay | Admitting: Student in an Organized Health Care Education/Training Program

## 2023-12-10 NOTE — Telephone Encounter (Signed)
 Writer called and spoke with Angel Wells and informed him they could pick budesonide  up from the pharmacy 12/29/23.

## 2023-12-10 NOTE — Telephone Encounter (Signed)
 [facility]  Prior Authorization Request    Date of request: 12/10/2023  Time of call: 9:35 AM    Medication Requiring PA:budesonide  (PULMICORT ) 0.25 mg/2mL nebulizer suspension     Directions(dosing and frequency): Inhale 2 mLs (0.25 mg total) by nebulization 2 times daily     Quantity/ Day Supply:360 mL (90 day supply)     Dx code: J45.909    Request received from:[facility] PA Source: Pharmacy    Name and Phone Number of Pharmacy:  [phone] [place], [place] - [address]  [address] [place], [place] [address]  Phone: [phone]  Fax: [phone]     Insurance coverage confirmed: yes    Tax ID: [id number]    Provider and NPI: [id number]    Additional Justification:n/a    Key ID: [id number]    Please provide justification for PA submission & urgency or indicate which covered alternative was ordered    This submission is for continuation of pain management therapy on which the patient has been stable and prevents hospitalization for the manifestations of underlying sickle cell disease.  Per the CDC statement in April 2019 opioid therapy is clinically indicated in the care of patients with sickle cell disease and their recommendations should NOT be utilized to deny these patients access to opioid therapy.  Lack of access to this medication will result in increased hospital utilization, increase risk of significant morbidity and mortality.

## 2023-12-10 NOTE — Telephone Encounter (Signed)
 [facility]  Prior Authorization Request    Date of request: 12/10/2023  Time of call: 4:03 PM    Medication Requiring PA: zinc  oxide (DESITIN) 40 % paste     Directions(dosing and frequency): Route: Apply topically 2 times daily. - Topical     Quantity/ Day Supply:113 g     Dx code: Z61    Request received from:CCC PA Source: Pharmacy    Name and Phone Number of Pharmacy:  [facility], [place] - [place], [place] - [address] [facility]  [address] [place], [place] [address]  Phone: [phone]  Fax: [phone]     Insurance coverage confirmed: yes    Tax ID: [id number]    Provider and NPI: [id number]    Additional Justification:n/a    Key ID: [id number]    Please provide justification for PA submission & urgency or indicate which covered alternative was ordered    This submission is for continuation of pain management therapy on which the patient has been stable and prevents hospitalization for the manifestations of underlying sickle cell disease.  Per the CDC statement in April 2019 opioid therapy is clinically indicated in the care of patients with sickle cell disease and their recommendations should NOT be utilized to deny these patients access to opioid therapy.  Lack of access to this medication will result in increased hospital utilization, increase risk of significant morbidity and mortality.

## 2023-12-12 ENCOUNTER — Other Ambulatory Visit: Payer: Self-pay | Admitting: Student in an Organized Health Care Education/Training Program

## 2023-12-14 ENCOUNTER — Other Ambulatory Visit: Payer: Self-pay | Admitting: Neurology

## 2023-12-14 ENCOUNTER — Encounter: Payer: Self-pay | Admitting: Student in an Organized Health Care Education/Training Program

## 2023-12-14 ENCOUNTER — Other Ambulatory Visit: Payer: Self-pay | Admitting: Student in an Organized Health Care Education/Training Program

## 2023-12-14 DIAGNOSIS — G40219 Localization-related (focal) (partial) symptomatic epilepsy and epileptic syndromes with complex partial seizures, intractable, without status epilepticus: Secondary | ICD-10-CM

## 2023-12-14 MED ORDER — METFORMIN HCL 500 MG/5ML PO SOLN *I*
1000.0000 mg | Freq: Two times a day (BID) | ORAL | 0 refills | Status: DC
Start: 2023-12-14 — End: 2024-03-13

## 2023-12-14 NOTE — Telephone Encounter (Signed)
 This patient attachment is clinically relevant.  Please keep in the patient's chart.    []  Document  [x]  Photo    Brief attachment description: fungal rash  (Ex. L forearm rash, WC papers)    Thank you,  Tresa Frohlich, RN

## 2023-12-18 ENCOUNTER — Other Ambulatory Visit: Payer: Self-pay | Admitting: Student in an Organized Health Care Education/Training Program

## 2023-12-18 ENCOUNTER — Encounter: Payer: Self-pay | Admitting: Student in an Organized Health Care Education/Training Program

## 2023-12-18 DIAGNOSIS — J9611 Chronic respiratory failure with hypoxia: Secondary | ICD-10-CM

## 2023-12-18 NOTE — Telephone Encounter (Signed)
 Need updated script and qualifying O2 sats.

## 2023-12-19 ENCOUNTER — Encounter: Payer: Self-pay | Admitting: Student in an Organized Health Care Education/Training Program

## 2023-12-19 ENCOUNTER — Other Ambulatory Visit: Payer: Self-pay | Admitting: Student in an Organized Health Care Education/Training Program

## 2023-12-19 DIAGNOSIS — Z794 Long term (current) use of insulin: Secondary | ICD-10-CM

## 2023-12-19 DIAGNOSIS — E1165 Type 2 diabetes mellitus with hyperglycemia: Secondary | ICD-10-CM

## 2023-12-19 DIAGNOSIS — E119 Type 2 diabetes mellitus without complications: Secondary | ICD-10-CM

## 2023-12-19 NOTE — Progress Notes (Signed)
 Incoming paperwork for completion to the [facility]  Date Received:    Date Paperwork was Intake: 12/19/2023     Type of paperwork:   Sender/organization: [employer]  Form type: Home Health Certification and Plan of Care    Completed paperwork to be sent to:  Fax to [phone]    Placed in box for: Cherl Corner  5:07 PM  12/19/2023

## 2023-12-20 ENCOUNTER — Encounter: Payer: Self-pay | Admitting: Gastroenterology

## 2023-12-20 ENCOUNTER — Other Ambulatory Visit: Payer: Self-pay | Admitting: Student in an Organized Health Care Education/Training Program

## 2023-12-20 DIAGNOSIS — E119 Type 2 diabetes mellitus without complications: Secondary | ICD-10-CM

## 2023-12-20 DIAGNOSIS — Z794 Long term (current) use of insulin: Secondary | ICD-10-CM

## 2023-12-20 DIAGNOSIS — E1165 Type 2 diabetes mellitus with hyperglycemia: Secondary | ICD-10-CM

## 2023-12-20 NOTE — Telephone Encounter (Signed)
 Angel Wells from [facility] called still having received a call regarding the issues below please call at [phone]

## 2023-12-21 ENCOUNTER — Telehealth: Payer: Self-pay | Admitting: Student in an Organized Health Care Education/Training Program

## 2023-12-21 ENCOUNTER — Other Ambulatory Visit
Admission: RE | Admit: 2023-12-21 | Discharge: 2023-12-21 | Disposition: A | Discharge status: Home / Self Care | Source: Ambulatory Visit

## 2023-12-21 ENCOUNTER — Other Ambulatory Visit: Payer: Self-pay | Admitting: Student in an Organized Health Care Education/Training Program

## 2023-12-21 ENCOUNTER — Other Ambulatory Visit: Payer: Self-pay

## 2023-12-21 ENCOUNTER — Ambulatory Visit: Payer: Medicare Other | Attending: Neurology | Admitting: Neurology

## 2023-12-21 DIAGNOSIS — G40219 Localization-related (focal) (partial) symptomatic epilepsy and epileptic syndromes with complex partial seizures, intractable, without status epilepticus: Secondary | ICD-10-CM | POA: Insufficient documentation

## 2023-12-21 DIAGNOSIS — B372 Candidiasis of skin and nail: Secondary | ICD-10-CM

## 2023-12-21 LAB — COMPREHENSIVE METABOLIC PANEL
ALT: 136 U/L — ABNORMAL HIGH (ref 0–50)
AST: 56 U/L — ABNORMAL HIGH (ref 0–50)
Albumin: 4.6 g/dL (ref 3.5–5.2)
Alk Phos: 90 U/L (ref 40–130)
Anion Gap: 19 — ABNORMAL HIGH (ref 7–16)
Bilirubin,Total: 0.2 mg/dL (ref 0.0–1.2)
CO2: 21 mmol/L (ref 20–28)
Calcium: 10.2 mg/dL (ref 9.0–10.3)
Chloride: 99 mmol/L (ref 96–108)
Creatinine: 0.37 mg/dL — ABNORMAL LOW (ref 0.67–1.17)
Glucose: 114 mg/dL — ABNORMAL HIGH (ref 60–99)
Lab: 14 mg/dL (ref 6–20)
Potassium: 4.4 mmol/L (ref 3.3–5.1)
Sodium: 139 mmol/L (ref 133–145)
Total Protein: 8.2 g/dL — ABNORMAL HIGH (ref 6.3–7.7)
eGFR BY CREAT: 152 *

## 2023-12-21 LAB — CBC
Hematocrit: 44 % (ref 37–52)
Hemoglobin: 14 g/dL (ref 12.0–17.0)
MCV: 91 fL (ref 75–100)
Platelets: 233 10*3/uL (ref 150–450)
RBC: 4.9 MIL/uL (ref 4.0–6.0)
RDW: 13.4 % (ref 0.0–15.0)
WBC: 7.2 10*3/uL (ref 3.5–11.0)

## 2023-12-21 NOTE — Telephone Encounter (Signed)
 [facility] TELEPHONE TRIAGE     Reason for call: Polly Brink requests to speak directly to a nurse. Polly Brink requests patient is scheduled for an appointment with patients PCP early next week. Polly Brink states she will keep calling to receive an update from patients provider.     Name of caller: Loetta Ringer for Janelle Culton  Relationship to patient: PDN  Phone: [phone]

## 2023-12-21 NOTE — Progress Notes (Signed)
 Outgoing paperwork:    Completed paperwork received on  12/21/2023  Sent to scanning? Yes  Sent as outlined in prior documentation: Fax to [phone]    Mikki Alexander  1:17 PM  12/21/2023

## 2023-12-21 NOTE — Progress Notes (Signed)
 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  Angel Wells, his primary nurse, accompanied him today.  I last saw him on 01/03/2023.  I transcribed the history of present illness below during my discussion with Angel Wells and Angel Wells, though it was not written verbatim.    HISTORY OF PRESENT ILLNESS:  He had no seizures in February or March.  He has otherwise had multiple seizures per month.  His lungs have been good.  He is being treated for three different skin conditions: underarm yeast rash, redness around the Mickey, and seborrhea on his ear.  He loves watching video games.  They would like to get him an adaptive set up so that he could play video games again.  He continues to take lacosamide  10 mg/mL 10 mL per tube twice daily, levetiracetam  100 mg/mL 15 mL per tube twice daily, and valproic acid  50 mg/mL 5 mL per tube three times daily.      EXAMINATION:  No formal examination was performed today.      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  His seizure disorder has remained stable with his current three medication management regimen.  I have suggested to his father in the past that we could titrate his dose of lacosamide  to a daily maximum of 200 mg twice daily with the goal of optimizing his seizure control, but his father has indicated a preference not to increase any of his medications.  I asked Angel Wells and Angel Wells to have his blood drawn to update his surveillance blood work.      PLAN:  He will continue lacosamide  10 mg/mL 10 mL per tube twice daily, levetiracetam  100 mg/mL 15 mL  per tube twice daily, and valproic acid  50 mg/mL 5 mL per tube three times daily.  He will have blood drawn to check a CBC, comprehensive metabolic profile, and valproic acid  level.  I will see him back in one year.  I would be happy to see him sooner if needed.      Alethia Andrea, MD  Professor of Clinical Neurology  Pager: [phone]  Office phone: [phone]  Office fax: [phone]  12/21/2023 3:22 PM

## 2023-12-22 LAB — VALPROIC ACID LEVEL, TOTAL: Valproic Acid: 35 ug/mL — ABNORMAL LOW (ref 50–100)

## 2023-12-23 ENCOUNTER — Encounter: Payer: Self-pay | Admitting: Neurology

## 2023-12-26 ENCOUNTER — Other Ambulatory Visit: Payer: Self-pay | Admitting: Student in an Organized Health Care Education/Training Program

## 2023-12-26 NOTE — Progress Notes (Signed)
 [facility]    Visit Note     REASON FOR VISIT      Chief Complaint   Patient presents with    Other     Mickey button redness   Medication refills          PRIMARY DIAGNOSIS      TBI    Subjective     SUBJECTIVE      Right armpit rash  - noticed several days ago  - has progressively worsened  - no fevers  - reports with weather change having more moisture in occluded skin areas including hsi armpits    Skin changes behind ears  - skin improved significantly with steroid cream from last appointment  - was doing well and then started to have some of the skin change return with flaky/oily skin  - no new symptoms including erythema, swelling, fever    Gtube irritation  - having some irritation return around his gtube  - no bleeding or swelling  - no purulent looking discharge    Type 2 diabetes  - has been off long acting insulin    - sugars overall are still well controlled  - requiring minimal to no short acting insulin         ROS  Review of Systems as per HPI above    Past Medical History, Social History, Family History, and Medications/allergies reviewed during this visit    Current Outpatient Medications   Medication    lacosamide  (VIMPAT ) 10 mg/mL SOLN oral solution    ketoconazole  (NIZORAL ) 2 % cream    chlorhexidine  (PERIDEX ) 0.12 % solution    blood glucose (ACCU-CHEK GUIDE) test strip    levETIRAcetam  (KEPPRA ) 100 mg/mL solution    metFORMIN  (GLUCOPHAGE ) 500 MG/5ML solution    SILACE 150 MG/15ML syrup    cetirizine  (ZYRTEC ) 10 mg tablet    azelastine  (ASTELIN ) 0.1 % nasal spray    guaifenesin  (HUMIBID E) 400 mg TABS    budesonide  (PULMICORT ) 0.25 mg/2mL nebulizer suspension    zinc  oxide (DESITIN) 40 % paste    nystatin  (MYCOSTATIN ) ointment    nystatin  (MYCOSTATIN ) powder    INVOKANA  300 MG tablet    miconazole  (MICATIN) 2 % powder    baclofen  (LIORESAL ) 10 mg tablet    MIC-KEY gastrostomy kit 14FR    MIC-KEY gastrostomy feeding tube extension set 24    clobetasol  (TEMOVATE ) 0.05 %  ointment    clonazePAM  (KLONOPIN ) 1 mg tablet    ipratropium (ATROVENT ) 0.02 % nebulizer solution    famotidine  (PEPCID ) 20 mg tablet    Multiple Vitamins-Minerals (MULTIVITAMIN GUMMIES ADULTS PO)    B-D UF III MINI PEN NEEDLES 31G X 5 MM    fluticasone  (FLONASE ) 50 MCG/ACT nasal spray    ANTACID ULTRA STRENGTH 1000 MG chewable tablet    carbamide peroxide (DEBROX) 6.5 % otic solution    Glucagon  (GVOKE HYPOPEN  1-PACK) 1 MG/0.2ML SOAJ    EPINEPHrine  (EPIPEN ) 0.3 mg/0.3 mL auto-injector    albuterol  (PROVENTIL ) (2.5 mg/69mL) 0.083% nebulizer solution    bisacodyl  (DULCOLAX) 10 mg suppository    valproate (VALPROIC ACID ) 50 mg/mL oral solution    lactobacillus rhamnosus, GG, (CULTURELLE) capsule    sodium chloride  0.9 % nebulizer solution    Lactulose 20 gm/30mL solution    Probiotic Product (PROBIOTIC GUMMIES) 30 MG CHEW    EPINEPHrine  (EPIPEN  JR) 0.15 mg/0.3 mL auto-injector    Accu-Chek FastClix Lancets MISC    sodium chloride  (OCEAN) 0.65 % nasal  spray    SENNA 8.6 MG tablet    generic DME    generic DME    oxygen     generic DME    cholecalciferol (VITAMIN D ) 1,000 unit tablet    triamcinolone  (KENALOG ) 0.025 % cream    Ascorbic Acid (VITAMIN C ADULT GUMMIES PO)    insulin  pen needle (FIFTY50 PEN NEEDLES) 31G X 8 MM     No current facility-administered medications for this visit.          Objective     OBJECTIVE      BP 107/58   Pulse 86   Temp 35.9 C (96.6 F) (Temporal)   Wt 78 kg (172 lb)   SpO2 95%   BMI 30.47 kg/m     Physical Exam  Constitutional:       General: He is not in acute distress.     Appearance: He is not ill-appearing.      Comments: Sitting comfortably in wheelchair   HENT:      Head: Atraumatic.      Nose: Rhinorrhea present.      Mouth/Throat:      Mouth: Mucous membranes are moist.   Eyes:      Extraocular Movements: Extraocular movements intact.      Conjunctiva/sclera: Conjunctivae normal.   Pulmonary:      Effort: Pulmonary effort is normal. No respiratory distress.   Skin:      Findings: Rash (erythematous R axillary rash with satellite lesions, gtube with mild surrounding erythema) present.   Neurological:      Mental Status: He is alert. Mental status is at baseline.                ASSESSMENT / DIAGNOSIS     1. Candidal intertrigo  - ketoconazole  cream for R axilla  - discussed mitigating additional fungal issues by keeping areas cool and dry     2. Moderate persistent asthma without complication  - budesonide  (PULMICORT ) 0.25 mg/2mL nebulizer suspension; Inhale 2 mLs (0.25 mg total) by nebulization 2 times daily.  Dispense: 360 mL; Refill: 5    3. Type 2 diabetes mellitus  - doing well off long acting insulin   - continue to monitor sugars and will re assess diabetes medication changes this fall    4. Seborrheic dermatitis  - restart clobetasol   - can stop once rash resolves, would not use for more than 2 weeks at a time       Orders Placed This Encounter    azelastine  (ASTELIN ) 0.1 % nasal spray    guaifenesin  (HUMIBID E) 400 mg TABS    budesonide  (PULMICORT ) 0.25 mg/2mL nebulizer suspension    zinc  oxide (DESITIN) 40 % paste       There are no Patient Instructions on file for this visit.       --Patient instructed to call if symptoms are not improving or worsening  --Follow-up arranged  Follow up if symptoms worsen or fail to improve.     I personally spent 30 minutes on the calendar day of the encounter, including pre and post visit work reviewing the EMR and management of this patient.     Electronically signed by Abner Ables, DO at 6:33 AM on 12/26/2023     [facility], Phone: [phone]

## 2023-12-28 ENCOUNTER — Telehealth: Payer: Self-pay | Admitting: Student in an Organized Health Care Education/Training Program

## 2023-12-28 NOTE — Telephone Encounter (Signed)
 [facility]  Prior Authorization Request    Date of request: 12/28/2023  Time of call: 11:47 AM    Medication Requiring PA:azelastine  (ASTELIN ) 0.1 % nasal spray     Directions(dosing and frequency):Apply 2 sprays in each nostril every morning and then in the afternoon as needed     Quantity/ Day Supply:30 mL     Dx code:  J30.9     Request received from:CCC PA Source: Pharmacy    Name and Phone Number of Pharmacy:[facility], [place], [place] - [address] [facility]  [address] [place], [place] [address]  Phone: [phone]  Fax: [phone]       Please run secondary if not covered under primary: yes       Insurance coverage confirmed: yes    Tax ID: [id number]    Provider and NPI: [id number]    Additional Justification:n/a    Key ID:     Please provide justification for PA submission & urgency or indicate which covered alternative was ordered    This submission is for continuation of pain management therapy on which the patient has been stable and prevents hospitalization for the manifestations of underlying sickle cell disease.  Per the CDC statement in April 2019 opioid therapy is clinically indicated in the care of patients with sickle cell disease and their recommendations should NOT be utilized to deny these patients access to opioid therapy.  Lack of access to this medication will result in increased hospital utilization, increase risk of significant morbidity and mortality.

## 2024-01-02 ENCOUNTER — Other Ambulatory Visit: Payer: Self-pay | Admitting: Student in an Organized Health Care Education/Training Program

## 2024-01-04 ENCOUNTER — Other Ambulatory Visit: Payer: Self-pay

## 2024-01-04 ENCOUNTER — Ambulatory Visit
Attending: Student in an Organized Health Care Education/Training Program | Admitting: Student in an Organized Health Care Education/Training Program

## 2024-01-04 ENCOUNTER — Other Ambulatory Visit
Admission: RE | Admit: 2024-01-04 | Discharge: 2024-01-04 | Disposition: A | Discharge status: Home / Self Care | Source: Ambulatory Visit

## 2024-01-04 VITALS — BP 114/57 | HR 89 | Temp 98.1°F | Wt 168.8 lb

## 2024-01-04 DIAGNOSIS — B372 Candidiasis of skin and nail: Secondary | ICD-10-CM | POA: Insufficient documentation

## 2024-01-04 DIAGNOSIS — E119 Type 2 diabetes mellitus without complications: Secondary | ICD-10-CM | POA: Insufficient documentation

## 2024-01-04 DIAGNOSIS — Z931 Gastrostomy status: Secondary | ICD-10-CM | POA: Insufficient documentation

## 2024-01-04 MED ORDER — KETOCONAZOLE 2 % EX CREA *I*
TOPICAL_CREAM | Freq: Two times a day (BID) | CUTANEOUS | 0 refills | Status: DC
Start: 2024-01-04 — End: 2024-03-05

## 2024-01-04 MED ORDER — CLOBETASOL PROPIONATE 0.05 % EX OINT *I*: TOPICAL_OINTMENT | CUTANEOUS | 0 refills | Status: DC

## 2024-01-04 NOTE — Progress Notes (Signed)
 [facility] - Comprehensive Medication Management Note    Angel Wells is a 33 y.o. male who presents for co-visit with PCP and clinical pharmacist. Patient was referred by PCP for comprehensive medication management in the setting of polypharmacy/medication complexity and Type 2 DM. Patient's medications, preferred pharmacy and allergies were updated and marked as reviewed, as appropriate.                  Vitals:    01/04/24 1344   BP: 114/57   Pulse: 89   Temp: 36.7 C (98.1 F)   Weight: 76.6 kg (168 lb 12.8 oz)        Additional labs/vitals as documented in chart - reviewed all relevant objective info at time of visit.    Current Outpatient Medications   Medication Sig Dispense Refill    lacosamide  (VIMPAT ) 10 mg/mL SOLN oral solution Take 10 MILLILITER per tube twice daily. ; MAXIMUM DAILY DOSE 20 MILLILITER 600 mL 5    INVOKANA  300 MG tablet take 1 tablet by mouth daily (before breakfast). 30 tablet 0    clobetasol  (TEMOVATE ) 0.05 % ointment apply two times a day to the affected area. do not use for more than 2 weeks at a time. 45 g 0    ketoconazole  (NIZORAL ) 2 % cream apply topically 2 times daily. to the following areas: right armpit 60 g 0    chlorhexidine  (PERIDEX ) 0.12 % solution use as directed 473 mL 0    blood glucose (ACCU-CHEK GUIDE) test strip USE 1 STRIP 3 TIMES A DAY 100 each 5    levETIRAcetam  (KEPPRA ) 100 mg/mL solution give 15 milliliter by g-tube twice daily 1050 mL 0    metFORMIN  (GLUCOPHAGE ) 500 MG/5ML solution Take 10 mLs (1,000 mg total) by mouth 2 times daily (with meals). 473 mL 0    SILACE 150 MG/15ML syrup take 10 mls by mouth 3 times daily hold for loose stool 900 mL 1    cetirizine  (ZYRTEC ) 10 mg tablet administer 1 tablet (10 MILLIGRAM total) via g tube nightly. 90 tablet 1    azelastine  (ASTELIN ) 0.1 % nasal spray Apply 2 sprays in each nostril every morning and then in the afternoon as needed 30 mL 0    guaifenesin  (HUMIBID E) 400 mg TABS Take 1 tablet (400 mg  total) by mouth 3 times daily. 90 tablet 0    budesonide  (PULMICORT ) 0.25 mg/47mL nebulizer suspension Inhale 2 mLs (0.25 mg total) by nebulization 2 times daily. 360 mL 5    zinc  oxide (DESITIN) 40 % paste Apply topically 2 times daily. 113 g 1    nystatin  (MYCOSTATIN ) ointment apply topically with each diaper change as needed for diaper rash. to the following areas: gtube site after mild cleansing 15 g 0    nystatin  (MYCOSTATIN ) powder apply topically 4 times daily to the following areas: neck, groin, axilla 30 g 0    miconazole  (MICATIN) 2 % powder Apply topically 2 times daily as needed. 71 g 0    baclofen  (LIORESAL ) 10 mg tablet take 1 tablet by mouth 2 times daily 180 tablet 0    MIC-KEY gastrostomy kit 14FR Please dispense every 3 months and prn, MicKey 14Fr 2.5cm 1 kit 3    MIC-KEY gastrostomy feeding tube extension set 24 Use as directed, please dispense 4 per month 4 kit 5    clonazePAM  (KLONOPIN ) 1 mg tablet take 1 tablet by mouth twice daily as needed (crush for g-tube) for seizures; maximum daily  dose 2 tabs 10 tablet 0    ipratropium (ATROVENT ) 0.02 % nebulizer solution NEBULIZE 1 UNIT DOSE THREE TIMES A DAY, MAY GIVE 1 ADDITIONAL DOSE ONCE DAILY IS NEEDED FOR WHEEZING/COUGH/RESPIRATORY DISTRESS DISPENSE: 120 UNIT DOSES BILL TO MCR PART B, DIAGNOSIS CODE: J45.909 312.5 mL 2    famotidine  (PEPCID ) 20 mg tablet take 1 tablet by mouth 2 times daily. ok to crush it and put it down tube if necessary 180 tablet 0    Multiple Vitamins-Minerals (MULTIVITAMIN GUMMIES ADULTS PO) Take by mouth daily. 2 gummies one a day nutritional supplement      B-D UF III MINI PEN NEEDLES 31G X 5 MM USE 3 (THREE) TIMES DAILY AS NEEDED. 100 each 5    fluticasone  (FLONASE ) 50 MCG/ACT nasal spray spray 1 spray into nostril daily 16 mL 5    ANTACID ULTRA STRENGTH 1000 MG chewable tablet place 1 tablet into mouth, chew and swallow daily. 30 tablet 5    carbamide peroxide (DEBROX) 6.5 % otic solution use 5 drops twice daily on the  first 3 days of each month. 15 mL 0    Glucagon  (GVOKE HYPOPEN  1-PACK) 1 MG/0.2ML SOAJ for Disorder with Low Blood Sugar. Use as needed for blood glucose < 70 1 each 5    EPINEPHrine  (EPIPEN ) 0.3 mg/0.3 mL auto-injector use as directed 2 each 0    albuterol  (PROVENTIL ) (2.5 mg/3mL) 0.083% nebulizer solution INHALE 3 MLS BY NEBULIZATION 4 TIMES DAILY FOR SPASM OF LUNG AIR PASSAGES WITH VESTING. WHEN ILL, USE FOUR TIMES A DAY WITH VESTING. 1050 mL 3    bisacodyl  (DULCOLAX) 10 mg suppository place 1 suppository rectally every other day and as needed if pt has not had a bowel movement in 2 days 30 suppository 5    valproate (VALPROIC ACID ) 50 mg/mL oral solution take 5 milliliter by mouth 3 times daily 1350 mL 5    lactobacillus rhamnosus, GG, (CULTURELLE) capsule Take 2 capsules (2 each total) by mouth daily. 30 capsule 5    sodium chloride  0.9 % nebulizer solution Inhale 5 mLs by nebulization 3 times daily 17 mL 0    Lactulose 20 gm/30mL solution administer 30 mls (20 g total) via g tube daily as needed (no stool in 48 hours) use as directed 946 mL 5    Probiotic Product (PROBIOTIC GUMMIES) 30 MG CHEW Take 30 mg by mouth daily. 2  tablets/gummies as provided by [facility]      EPINEPHrine  (EPIPEN  JR) 0.15 mg/0.3 mL auto-injector Inject 0.3 mLs (0.15 mg total) into the muscle as needed for Anaphylaxis.      Accu-Chek FastClix Lancets MISC 1 EACH BY MISC.(NON-DRUG COMBO ROUTE) ROUTE 3 (THREE) TIMES DAILY. E11.65 102 each 5    sodium chloride  (OCEAN) 0.65 % nasal spray Spray 1 spray into each nostril 2 times daily as needed for Congestion 45 mL 5    SENNA 8.6 MG tablet administer 2 tablets via g tube daily 180 tablet 5    generic DME Item to dispense: Bivona silicone cuffed trach tube size 7 80 mm length  Instructions for use: Use as directed 2 each 5    generic DME Vent settings:  SIMV/PC/PS, RATE 10 IP 21. PS 13, PEEP 7, HIGH PRESSURE 50, LOW PRESSURE 5, HIGH RR 40, LOW RR14 1 each 0    oxygen  1-2 L/min to  keep O2 sat above 92%, may increase up to 4 lpm as needed.  Low saturation alarm 85%, high alarm off, low  HR 50, High HR 200 1 each 0    generic DME [employer] Percussion Vest Model # 105  55inch  Use as directed. 1 each 0    cholecalciferol (VITAMIN D ) 1,000 unit tablet Take 1 tablet (1,000 units total) by mouth daily. 2 chews or tablet via G-tube- for total dose of 2,000 units.      triamcinolone  (KENALOG ) 0.025 % cream Apply topically 2 times daily  APPLY TO AFFECTED AREA 15 g 2    Ascorbic Acid (VITAMIN C ADULT GUMMIES PO) Take 250 mg by mouth daily  Give 2 gummies once daily       insulin  pen needle (FIFTY50 PEN NEEDLES) 31G X 8 MM 1 each       No current facility-administered medications for this visit.         Assessment and Plan:  With regards to medication management for this patient, the following aspects were reviewed: Indication, effectiveness, safety and convenience of each medication. The patient's medical conditions and medications were assessed, evaluated, and deemed meeting goals of drug therapy except as listed below:     1. Type 2 Diabetes  Due for repeat A1c   Currently taking Metformin  1000mg  BID + Invokana  300mg  daily   Toujeo  was discontinued in May PCP visit  FPG's and post-prandials are </= 140mg /dL  No changes needed at this time    2. 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 Other Medication-Related Interventions: In addition to the medication changes noted above, these medication-related interventions were completed by the Pharmacist during today's visit:  general medication related education + disease state education    Plan  Recommendations were discussed with Provider (Dr. Lawernce) during appointment. Plan for implementation of medication changes are documented in Provider's note.     Patient was able to verbalize understanding and repeat back key educational points.    Follow-up: Next PCP visit     Patient Status: Open to  [facility]     Duration of Visit: 16-30 min    Thank you for allowing me to participate in the care of this patient. Please contact me with any questions.     Oscar Rosa, PharmD., BCCCP  Clinical Pharmacist Practitioner, CPP  [facility]  [address] [place], [place] [address]  [phone]

## 2024-01-04 NOTE — Progress Notes (Signed)
 [facility]     Subjective     Angel Wells is a 33 y.o. male who presents for Other (Under arm rashes /G tube eval/Small trach for emergencies )  History of Present Illness  The patient presents for evaluation of diabetes, rash in the armpit, and rash behind the ears.    He has been experiencing a rash under his arm, which has shown improvement following a change in his shirt style to sleeveless. The rash has also manifested on the left side, but it is less severe and appears to be healing. He continues to use antifungal medication.    His ear condition has improved. He uses clobetasol  0.05% twice daily for a week when he notices yellow flakes behind his ears. He does not apply it to his eyebrows, even if they become flaky, to avoid potential eye irritation.    He has a MIC-KEY tube in place, which is slightly longer than necessary. The balloon water  is changed weekly without any leakage. He is considering whether a larger size might be more suitable to better seal the opening.    He has a Bivona 7.0 mm silicone cuffed tracheostomy tube in place, which is well-healed. He is contemplating having a smaller size on hand as a backup.    He is due for an A1c test. He recently had an annual visit with his neurologist, during which blood work was performed, but the results have not yet been received. He is due for urine microalbumin screen.         Objective   Blood pressure 114/57, pulse 89, temperature 36.7 C (98.1 F), temperature source Temporal, weight 76.6 kg (168 lb 12.8 oz), SpO2 94%.  Physical Exam  Ears: Yellow flakes noted behind both ears.  Skin: Rash noted in the right and left armpits.    Results  Laboratory Studies  Fasting blood sugars are less than 140.          Assessment & Plan  1. Candidal intertrigo   The rash is improving after last visit with topical antifungal and supportive changes including decreased moisture with sleeveless shirts. Continue current therapy, discussed they can  consider using a powder like goldbond powder for added moisture control. Follow up if rash worsens.    2. Rash behind the ears.  The rash behind the ears is characterized by yellow flakes. Clobetasol  0.05% will be applied twice daily as needed for 7 days if yellow flakes appear. If the rash spreads to other areas, a less potent steroid may be considered.    3. Diabetes.  Blood sugar levels are well-controlled, with fasting sugars consistently below 140. An A1c test will be conducted today. A clean catch urine sample will be collected for microalbumin testing.    4. G-tube management.  The G-tube is functioning well, with no leakage or discomfort reported. Desitin will be applied as needed for any irritation around the G-tube site. The G-tube will continue to be monitored closely. If it starts leaking or causing issues, a consultation with the trauma surgery team will be considered.    5. Tracheostomy management.  A smaller size tracheostomy tube will be ordered as a backup. The respiratory therapist will verify the correct size before ordering.    Follow-up:  The patient will follow up in August.      I personally spent 30 minutes on the calendar day of the encounter, including pre and post visit work.  Author: Elsie Mayotte, DO  Note signed: 01/16/2024

## 2024-01-06 LAB — HEMOGLOBIN A1C: Hemoglobin A1C: 5 % (ref <=5.6)

## 2024-01-07 ENCOUNTER — Ambulatory Visit: Payer: Self-pay | Admitting: Student in an Organized Health Care Education/Training Program

## 2024-01-07 ENCOUNTER — Other Ambulatory Visit: Payer: Self-pay | Admitting: Student in an Organized Health Care Education/Training Program

## 2024-01-07 DIAGNOSIS — Z930 Tracheostomy status: Secondary | ICD-10-CM

## 2024-01-07 DIAGNOSIS — J9611 Chronic respiratory failure with hypoxia: Secondary | ICD-10-CM

## 2024-01-07 NOTE — Telephone Encounter (Signed)
 Order for back up trach

## 2024-01-07 NOTE — Progress Notes (Signed)
gener

## 2024-01-08 ENCOUNTER — Other Ambulatory Visit: Payer: Self-pay | Admitting: Student in an Organized Health Care Education/Training Program

## 2024-01-08 DIAGNOSIS — B372 Candidiasis of skin and nail: Secondary | ICD-10-CM

## 2024-01-09 ENCOUNTER — Other Ambulatory Visit: Payer: Self-pay | Admitting: Student in an Organized Health Care Education/Training Program

## 2024-01-09 DIAGNOSIS — Z8669 Personal history of other diseases of the nervous system and sense organs: Secondary | ICD-10-CM

## 2024-01-10 ENCOUNTER — Other Ambulatory Visit: Payer: Self-pay | Admitting: Student in an Organized Health Care Education/Training Program

## 2024-01-10 MED ORDER — AZELASTINE HCL 137 MCG/SPRAY NA SOLN *I*
NASAL | 3 refills | Status: DC
Start: 2024-01-10 — End: 2024-04-29

## 2024-01-12 ENCOUNTER — Other Ambulatory Visit
Admission: RE | Admit: 2024-01-12 | Discharge: 2024-01-12 | Disposition: A | Discharge status: Home / Self Care | Source: Ambulatory Visit | Attending: Student in an Organized Health Care Education/Training Program | Admitting: Student in an Organized Health Care Education/Training Program

## 2024-01-12 DIAGNOSIS — E119 Type 2 diabetes mellitus without complications: Secondary | ICD-10-CM | POA: Insufficient documentation

## 2024-01-12 LAB — MICROALBUMIN, URINE, RANDOM
Creatinine,UR: 52 mg/dL (ref 20–300)
Microalbumin,UR: <1.2 mg/dL

## 2024-01-14 MED ORDER — GENERIC DME *A*
0 refills | Status: AC
Start: 2024-01-14 — End: ?

## 2024-01-15 ENCOUNTER — Other Ambulatory Visit: Payer: Self-pay | Admitting: Student in an Organized Health Care Education/Training Program

## 2024-01-15 DIAGNOSIS — K219 Gastro-esophageal reflux disease without esophagitis: Secondary | ICD-10-CM

## 2024-01-16 MED ORDER — LEVETIRACETAM 100 MG/ML PO SOLN *I*
ORAL | 0 refills | Status: DC
Start: 2024-01-16 — End: 2024-02-13

## 2024-01-17 ENCOUNTER — Telehealth: Payer: Self-pay | Admitting: Student in an Organized Health Care Education/Training Program

## 2024-01-17 NOTE — Telephone Encounter (Signed)
 Writer received a call from [employer] in regards to test strips, due to circumstances Angel Wells is currently adjusting his medications and is not on insulin  right now. Due to him not being on Insulin  Insurance now only wants to cover 1 test strip a day but 3 strips a day are still needed. Can we run authorization through with new circumstances?    [facility]  Prior Authorization Request    Date of request: 01/17/2024  Time of call: 11:08 AM    Medication Requiring EJ:aonni glucose (ACCU-CHEK GUIDE) test strip     Directions(dosing and frequency): blood glucose (ACCU-CHEK GUIDE) test strip     Quantity/ Day Supply:100 each     Dx code: E11.9, E11.65     Request received from:CCC PA Source: Pharmacy    Name and Phone Number of Pharmacy:  [facility] [phone] [place], [place] - [address]  [address] [place] [address]  Phone: [phone]  Fax: [phone]       Please run secondary if not covered under primary: yes       Insurance coverage confirmed: yes    Tax ID: [id number]    Provider and NPI: [id number]    Additional Justification:n/a    Key ID:     Please provide justification for PA submission & urgency or indicate which covered alternative was ordered

## 2024-01-30 ENCOUNTER — Other Ambulatory Visit: Payer: Self-pay | Admitting: Student in an Organized Health Care Education/Training Program

## 2024-01-31 ENCOUNTER — Other Ambulatory Visit: Payer: Self-pay | Admitting: Student in an Organized Health Care Education/Training Program

## 2024-01-31 DIAGNOSIS — J9600 Acute respiratory failure, unspecified whether with hypoxia or hypercapnia: Secondary | ICD-10-CM

## 2024-01-31 DIAGNOSIS — Z9911 Dependence on respirator [ventilator] status: Secondary | ICD-10-CM

## 2024-02-04 ENCOUNTER — Other Ambulatory Visit: Payer: Self-pay | Admitting: Student in an Organized Health Care Education/Training Program

## 2024-02-05 ENCOUNTER — Other Ambulatory Visit: Payer: Self-pay | Admitting: Student in an Organized Health Care Education/Training Program

## 2024-02-05 ENCOUNTER — Other Ambulatory Visit: Payer: Self-pay | Admitting: Primary Care

## 2024-02-05 DIAGNOSIS — M2450 Contracture, unspecified joint: Secondary | ICD-10-CM

## 2024-02-07 ENCOUNTER — Other Ambulatory Visit: Payer: Self-pay | Admitting: Student in an Organized Health Care Education/Training Program

## 2024-02-07 DIAGNOSIS — Z8669 Personal history of other diseases of the nervous system and sense organs: Secondary | ICD-10-CM

## 2024-02-13 ENCOUNTER — Other Ambulatory Visit: Payer: Self-pay | Admitting: Student in an Organized Health Care Education/Training Program

## 2024-02-15 ENCOUNTER — Other Ambulatory Visit: Payer: Self-pay

## 2024-02-15 ENCOUNTER — Encounter: Payer: Self-pay | Admitting: Gastroenterology

## 2024-02-15 ENCOUNTER — Ambulatory Visit
Attending: Student in an Organized Health Care Education/Training Program | Admitting: Student in an Organized Health Care Education/Training Program

## 2024-02-15 VITALS — BP 104/59 | HR 87 | Temp 96.1°F | Wt 167.9 lb

## 2024-02-15 DIAGNOSIS — G8250 Quadriplegia, unspecified: Secondary | ICD-10-CM | POA: Insufficient documentation

## 2024-02-15 DIAGNOSIS — G40909 Epilepsy, unspecified, not intractable, without status epilepticus: Secondary | ICD-10-CM | POA: Insufficient documentation

## 2024-02-15 DIAGNOSIS — E119 Type 2 diabetes mellitus without complications: Secondary | ICD-10-CM | POA: Insufficient documentation

## 2024-02-15 MED ORDER — SURGILUBE EX GEL *I*
CUTANEOUS | 2 refills | Status: AC | PRN
Start: 2024-02-15 — End: ?

## 2024-02-15 NOTE — Progress Notes (Unsigned)
-   PT referral for wheelchair - orthotics for wrist

## 2024-02-15 NOTE — Progress Notes (Addendum)
 [facility] Care CenterVisit Note REASON FOR VISIT  Chief Complaint Patient presents with  Follow-up   Referral to PT for new WC and to review excercise. OT referral for splints that dont fit properly. BG are increasing per Mallie. Will give copy. Needs new bed. Rx has to say broken beyond repair G tube site and underarms improved. Lubricant order for trach placement.  PRIMARY DIAGNOSIS  Spastic quadriplegia Subjective SUBJECTIVE  Seizures- has had increase in seizures recently- maybe its from his clonus from waking up- then had one early in the day- did not have more than 3 in an hour, so he didn't need to have any rescue - no increase in secretions- working with neurology to determine if changes are needed for medications- no overt infection, though has been around sick contactsDM2- overall stable sugars- every once and awhile with a high sugar, but continues to be controlled- due for repeat A1CDME need- needs script for OT reassessment of bilateral wrist splints- working on Corporate investment banker wheelchair. Currently the one he has is broken beyond repair- also in need of a hospital bed  ROSReview of Systems as per HPI abovePast Medical History, Social History, Family History, and Medications/allergies reviewed during this visitCurrent Outpatient Medications Medication  surgical lubricant (SURGILUBE) gel  levETIRAcetam  (KEPPRA ) 100 mg/mL solution  carbamide peroxide (DEBROX) 6.5 % otic solution  baclofen  (LIORESAL ) 10 mg tablet  fluticasone  (FLONASE ) 50 MCG/ACT nasal spray  sodium chloride  0.9 % nebulizer solution  clobetasol  (TEMOVATE ) 0.05 % ointment  lacosamide  (VIMPAT ) 10 mg/mL SOLN oral solution  cetirizine  (ZYRTEC ) 10 mg tablet  budesonide  (PULMICORT ) 0.25 mg/2mL nebulizer suspension  nystatin  (MYCOSTATIN ) ointment  miconazole  (MICATIN) 2 % powder  valproate (VALPROIC ACID ) 50  mg/mL oral solution  Lactulose 20 gm/30mL solution  Probiotic Product (PROBIOTIC GUMMIES) 30 MG CHEW  Accu-Chek FastClix Lancets MISC  SENNA 8.6 MG tablet  blood glucose (ACCU-CHEK GUIDE) test strip  fidaxomicin  (DIFICID ) 200 MG tablet  famotidine  (PEPCID ) 20 mg tablet  INVOKANA  300 MG tablet  EPINEPHrine  (EPIPEN ) 0.3 mg/0.3 mL auto-injector  guaifenesin  (HUMIBID E) 400 mg TABS  bisacodyl  (DULCOLAX) 10 mg suppository  metFORMIN  (GLUCOPHAGE ) 500 MG/5ML solution  ketoconazole  (NIZORAL ) 2 % cream  albuterol  (PROVENTIL ) (2.5 mg/62mL) 0.083% nebulizer solution  ipratropium (ATROVENT ) 0.02 % nebulizer solution  generic DME  azelastine  (ASTELIN ) 0.1 % nasal spray  chlorhexidine  (PERIDEX ) 0.12 % solution  SILACE 150 MG/15ML syrup  zinc  oxide (DESITIN) 40 % paste  nystatin  (MYCOSTATIN ) powder  MIC-KEY gastrostomy kit 14FR  MIC-KEY gastrostomy feeding tube extension set 24  clonazePAM  (KLONOPIN ) 1 mg tablet  Multiple Vitamins-Minerals (MULTIVITAMIN GUMMIES ADULTS PO)  B-D UF III MINI PEN NEEDLES 31G X 5 MM  ANTACID ULTRA STRENGTH 1000 MG chewable tablet  Glucagon  (GVOKE HYPOPEN  1-PACK) 1 MG/0.2ML SOAJ  lactobacillus rhamnosus, GG, (CULTURELLE) capsule  EPINEPHrine  (EPIPEN  JR) 0.15 mg/0.3 mL auto-injector  sodium chloride  (OCEAN) 0.65 % nasal spray  generic DME  generic DME  oxygen   generic DME  cholecalciferol (VITAMIN D ) 1,000 unit tablet  triamcinolone  (KENALOG ) 0.025 % cream  Ascorbic Acid (VITAMIN C ADULT GUMMIES PO)  insulin  pen needle (FIFTY50 PEN NEEDLES) 31G X 8 MM No current facility-administered medications for this visit.  Objective OBJECTIVE  BP 104/59   Pulse 87   Temp 35.6 C (96.1 F)   Wt 76.2 kg (167 lb 14.4 oz)   SpO2 95%   BMI 29.74 kg/m Physical ExamConstitutional:     General: He is not in acute distress.   Appearance:  He is not ill-appearing. HENT:    Head: Atraumatic.    Nose: No  congestion.    Mouth/Throat:    Mouth: Mucous membranes are moist. Eyes:    Conjunctiva/sclera: Conjunctivae normal. Pulmonary:    Effort: Pulmonary effort is normal. No respiratory distress. Musculoskeletal:       General: No deformity.    Comments: Decreased tone in all extremities  Neurological:    Mental Status: He is alert. Mental status is at baseline.  ASSESSMENT / DIAGNOSIS 1. Seizure disorder- most likely provoked by recent infection, will obtain CBC and CMP to evaluate for signs of localizing infection- CBC and differential; Future- Comprehensive metabolic panel; Future2. Type 2 diabetes mellitus- sugars stable on current regimen- Hemoglobin A1c; Future3. 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 The ability to reposition him in a hospital bed will reduce the amount of pain and complicates associated with the high tone of his muscles- he also is high risk for aspiration and is dependent on feeds through a feeding tube requiring the head of his bed to be elevated to at least 30 degrees - the ability to adjust his bed height is needed to allow for safe transfers out of bed and into his wheelchair Orders Placed This Encounter  CBC and differential  Comprehensive metabolic panel  Hemoglobin A1c  surgical lubricant (SURGILUBE) gel There are no Patient Instructions on file for this visit. --Patient instructed to call if symptoms are not improving or worsening--Follow-up arrangedFollow up in about 2 months (around 04/16/2024) for Follow up, Diabetes, sugars . I personally spent 25 minutes on the calendar  day of the encounter, including pre and post visit work reviewing the EMR and management of this patient. Electronically signed by Elsie Mayotte, DO at 2:01 PM on 04/01/2024 [facility], Phone: [phone]

## 2024-02-20 ENCOUNTER — Other Ambulatory Visit: Payer: Self-pay | Admitting: Student in an Organized Health Care Education/Training Program

## 2024-02-20 ENCOUNTER — Telehealth: Payer: Self-pay | Admitting: Student in an Organized Health Care Education/Training Program

## 2024-02-20 DIAGNOSIS — J45909 Unspecified asthma, uncomplicated: Secondary | ICD-10-CM

## 2024-02-20 DIAGNOSIS — Z8701 Personal history of pneumonia (recurrent): Secondary | ICD-10-CM

## 2024-02-20 NOTE — Telephone Encounter (Signed)
 Florence Gift and notified that PCP would like pt to call if urine test is needed. She says that at this time pt is no longer having seizure activity or urinary sx. A1c to be rechecked in 2 months time. She is aware says that she will have this done at next visit.

## 2024-02-20 NOTE — Telephone Encounter (Signed)
 [facility]ARE C[facility] TELEPHONE INTAKEReason for call: Mallie states she would like to postpone urine test for patient due to no symptoms at this timeKate states will need new orderName of caller: KateRelationship to patient: RNPhone:  [phone]

## 2024-02-21 ENCOUNTER — Telehealth: Payer: Self-pay | Admitting: Neurology

## 2024-02-21 ENCOUNTER — Other Ambulatory Visit: Payer: Self-pay | Admitting: Student in an Organized Health Care Education/Training Program

## 2024-02-21 ENCOUNTER — Encounter: Payer: Self-pay | Admitting: Student in an Organized Health Care Education/Training Program

## 2024-02-21 ENCOUNTER — Telehealth: Payer: Self-pay | Admitting: Student in an Organized Health Care Education/Training Program

## 2024-02-21 DIAGNOSIS — L97129 Non-pressure chronic ulcer of left thigh with unspecified severity: Secondary | ICD-10-CM

## 2024-02-21 DIAGNOSIS — G8250 Quadriplegia, unspecified: Secondary | ICD-10-CM

## 2024-02-21 DIAGNOSIS — B372 Candidiasis of skin and nail: Secondary | ICD-10-CM

## 2024-02-21 NOTE — Telephone Encounter (Signed)
 Patient Telephone CallCaller name: Mallie Rushing number: [phone] for call: Caller stated the patient has had a few seizures, since his last visit. Patients pcp wanted Group home nurse to follow out to confirm. If patient needs follow up appointment with neurologist.Caller advised patients seizure activity is noted under Media in patients chart Action taken: Message routed

## 2024-02-21 NOTE — Telephone Encounter (Signed)
 [facility] TELEPHONE TRIAGE Reason for call: Mallie requests patient is referred to home physical therapy services through [facility] in [place]. Referral pended as requested. Name of caller: Mallie for Angel AgostoRelationship to patient: PDNPhone: [phone]

## 2024-02-21 NOTE — Telephone Encounter (Signed)
 I spoke with Angel Wells.  Angel Wells had three typical seizures in one day last week.  His father suspects that he has an infection when he has an increase in seizure frequency.  He has had a couple of his typical brief seizures lasting less than 10 seconds since last week.  Angel Wells PCP apparently advised them to contact me to find out if I want to have any testing done or an office visit, though I did not find any documentation of this.  Angel Wells has a medically intractable seizure disorder while taking three anti-seizure medications.  As I have documented previously, his father does not want to make further changes in his anti-seizure medications with the goal of improved seizure control.  I asked Angel Wells if Angel Wells father would like to make a change in his seizure medications now, and she told me that he does not.  I told her that I do not have any recommendations for testing, and there is no need for an office visit with me.

## 2024-02-21 NOTE — Progress Notes (Signed)
 Incoming paperwork for completion to the Complex Care CenterDate Received:Date Paperwork was Intake: 02/21/2024 Type of paperwork: Sender/organization: NurseCoreForm type: POC for continuing Nursing ServicesCompleted paperwork to be sent to:  Fax to [phone] in box for: HardyAngela Burns8:57 AM8/14/2025

## 2024-02-26 ENCOUNTER — Telehealth: Payer: Self-pay | Admitting: Student in an Organized Health Care Education/Training Program

## 2024-02-26 NOTE — Telephone Encounter (Signed)
 Attempted to call pt  No answer  LVM  Call back number provided    Loann Quill, RN

## 2024-02-26 NOTE — Telephone Encounter (Signed)
 Called and spoke to [employer] had an appt on 8/8 and was supposed to have multiple referrals placedRequesting placement of the following referrals:- Physical therapy at [facility]: wheelchair eval and home PT- [facility]: OT and splint revision- [facility] dermat[facility]: ongoing skin issues- [facility] services of [facility]: bed repair or replacement neededKate also mentioned she would like to obtain a back up suction machine for JoshRequesting input from Arland Galas route encounter to PCP and RT for orders

## 2024-02-26 NOTE — Telephone Encounter (Signed)
 [facility]ARE CENTER TELEPHONE INTAKEReason for call: Mallie would like to discuss referral that were requested for patientKate would like to speak with nurse/PCPName of caller: KateRelationship to patient: NursePhone: [phone]

## 2024-02-26 NOTE — Telephone Encounter (Signed)
 No answer. Unable to leave VM at this time. I spoke to [facility] (ventilator vendor) and they can try with a new script and chart notes with justification for backup unit. Will work on it.

## 2024-02-26 NOTE — Telephone Encounter (Signed)
 Mallie returning call to speak to a nurse once available. [phone]

## 2024-02-26 NOTE — Telephone Encounter (Signed)
 Complex Care CenterPrior Authorization RequestDate of request: 8/19/2025Time of call: 9:21 AMMedication Requiring PA:ipratropium (ATROVENT ) 0.02 % nebulizer solution Directions(dosing and frequency): NEBULIZE 1 UNIT DOSE THREE TIMES A DAY, MAY GIVE 1 ADDITIONAL DOSE ONCE DAILY IS NEEDED FOR WHEEZING/COUGH/RESPIRATORY DISTRESS Quantity/ Day Supply:312.5 mL Dx code: J45.909Request received from:CCC PA Source: PharmacyName and Phone Number of [facility]acy:[id number] [place] [place] - [address] [place] [address]Phone: [phone]  Fax: [phone] Please run secondary if not covered under primary: yes   Insurance coverage confirmed: yesCovered Alternative Medication: Tax ID: [id number] Provider and NPI: [id number] Additional Justification:n/aKey ID: [id number] provide justification for PA submission & urgency or indicate which covered alternative was orderedThis submission is for continuation of pain management therapy on which the patient has been stable and prevents hospitalization for the manifestations of underlying sickle cell disease.  Per the CDC statement in April 2019 opioid therapy is clinically indicated in the care of patients with sickle cell disease and their recommendations should NOT be utilized to deny these patients access to opioid therapy.  Lack of access to this medication will result in increased hospital utilization, increase risk of significant morbidity and mortality.

## 2024-03-03 ENCOUNTER — Encounter: Payer: Self-pay | Admitting: Gastroenterology

## 2024-03-04 NOTE — Progress Notes (Signed)
 Outgoing paperwork:Completed paperwork received on  8/26/2025Sent to scanning? YesSent as outlined in prior documentation:   Fax to [phone] Jon Burns1:51 PM8/26/2025

## 2024-03-07 ENCOUNTER — Telehealth: Payer: Self-pay | Admitting: Student in an Organized Health Care Education/Training Program

## 2024-03-07 ENCOUNTER — Encounter: Payer: Self-pay | Admitting: Student in an Organized Health Care Education/Training Program

## 2024-03-07 NOTE — Telephone Encounter (Signed)
 Called and spoke to [facility] pt has had 10 occurrences of diarrhea in the last 2 days, had an additional 4 occurrences since she arrived this morningPt does not seem dehydrated, caregivers pushing fluidsPt otherwise at baselineDenies change in urinary habits, fevers, and nausea/vomitingAdvised to be seen at [facility] todayNo CCC appts availableAgreeable to planBooked at [facility] next week for follow upAdvised to call office with any questions, concerns, or acute changesAcknowledged understandingWill route to PCP

## 2024-03-07 NOTE — Telephone Encounter (Signed)
 [facility]ENTER TELEPHONE TRIAGE Reason for call:  Mallie states patient has developed really bad diarrhea. Mallie states she's unable to take patient to urgent care. Mallie states she's unsure what to do for patient. Mallie inquired if there is an available provider who may be able to assess patient today. Name of caller: Mallie for Obi AgostoRelationship to patient: PDNPhone: [phone]

## 2024-03-07 NOTE — Telephone Encounter (Signed)
 COM[facility] CARE CENTER TELEPHONE INTAKEReason for call: Mallie requesting  call back from nursing, states Haile has diarrhea for 2 days. Name of caller: KateRelationship to patient: caregiverOrganization: Phone:  [phone]:

## 2024-03-07 NOTE — Telephone Encounter (Signed)
 Attempted to call pt  No answer  LVM  Call back number provided    Loann Quill, RN

## 2024-03-10 ENCOUNTER — Telehealth: Payer: Self-pay | Admitting: Student in an Organized Health Care Education/Training Program

## 2024-03-10 DIAGNOSIS — R197 Diarrhea, unspecified: Secondary | ICD-10-CM

## 2024-03-10 NOTE — Telephone Encounter (Signed)
 Angel Wells's nurse Mallie called over the weekend regarding diarrhea.Friday evening he had had multiple episodes during the day. Not watery, but fairly soft and frequent. No blood in his sotol. No fever, emesis, or reported abdominal pain. He did have mild tachycardia with HR in upper 90s, though this is within his normal range. Discussed this may be a viral gastroenteritis and with increasing HR discussed additional fluids through his gtube.Sunday morning nursing called to report overall improvemeng in Angel Wells's diarrhea and HR. Transitioned to brat type diet through his gtube and continued monitoring of his hydration status. Monday he had more episodes of diarrhea. Still without fever or abdominal pain. No blood in his stool or nausea/emesis. Reports now his mom is having similar symptoms. They also let me know that his father had a cdiff infection about a month ago. Angel Wells has not been on any antibiotics recently. Inquiring about additional testing and imaging.At this point I think we are still looking at viral gastroenteritis, especially now with a family member developing similar symptoms. With bowel movements and no emesis, low concern for bowel obstruction. I believe risk of cdiff is low, though he has had multiple antibiotic courses in his life and recent known exposure. Writer felt appropriate for cdiff and viral stool studies, but we can hold on imaging until he is evaluated in clinic. Discussed calling back for blood in stool, new fever or emesis.Angel Wells, DOComplex Care CenterInternal Medicine-Pediatrics

## 2024-03-11 ENCOUNTER — Other Ambulatory Visit
Admission: RE | Admit: 2024-03-11 | Discharge: 2024-03-11 | Disposition: A | Discharge status: Home / Self Care | Source: Ambulatory Visit | Attending: Student in an Organized Health Care Education/Training Program | Admitting: Student in an Organized Health Care Education/Training Program

## 2024-03-11 DIAGNOSIS — R197 Diarrhea, unspecified: Secondary | ICD-10-CM | POA: Insufficient documentation

## 2024-03-11 LAB — C DIFF TOXIN B NAAT (PCR)-TOXIN EIA IF POSITIVE: C diff toxin B NAAT (PCR) - Toxin EIA if positive: POSITIVE — AB

## 2024-03-11 LAB — C DIFF TOXIN A/B EIA: C diff toxin A/B EIA: NEGATIVE

## 2024-03-11 NOTE — Telephone Encounter (Signed)
 This concern was reviewed with the oncall provider over the weekend. Pt has acute visit for 9/3. No further needs this encounter.

## 2024-03-12 ENCOUNTER — Other Ambulatory Visit: Payer: Self-pay | Admitting: Oncology

## 2024-03-12 ENCOUNTER — Other Ambulatory Visit
Admission: RE | Admit: 2024-03-12 | Discharge: 2024-03-12 | Disposition: A | Discharge status: Home / Self Care | Source: Ambulatory Visit

## 2024-03-12 ENCOUNTER — Other Ambulatory Visit: Payer: Self-pay | Admitting: Student in an Organized Health Care Education/Training Program

## 2024-03-12 ENCOUNTER — Other Ambulatory Visit: Payer: Self-pay

## 2024-03-12 ENCOUNTER — Telehealth: Payer: Self-pay | Admitting: Student in an Organized Health Care Education/Training Program

## 2024-03-12 ENCOUNTER — Ambulatory Visit
Attending: Student in an Organized Health Care Education/Training Program | Admitting: Student in an Organized Health Care Education/Training Program

## 2024-03-12 ENCOUNTER — Other Ambulatory Visit: Payer: Self-pay | Admitting: Primary Care

## 2024-03-12 VITALS — BP 101/55 | HR 92 | Temp 97.8°F | Wt 168.4 lb

## 2024-03-12 DIAGNOSIS — G8250 Quadriplegia, unspecified: Secondary | ICD-10-CM | POA: Insufficient documentation

## 2024-03-12 DIAGNOSIS — G40909 Epilepsy, unspecified, not intractable, without status epilepticus: Secondary | ICD-10-CM | POA: Insufficient documentation

## 2024-03-12 DIAGNOSIS — R197 Diarrhea, unspecified: Secondary | ICD-10-CM | POA: Insufficient documentation

## 2024-03-12 DIAGNOSIS — E119 Type 2 diabetes mellitus without complications: Secondary | ICD-10-CM | POA: Insufficient documentation

## 2024-03-12 DIAGNOSIS — A0472 Enterocolitis due to Clostridium difficile, not specified as recurrent: Secondary | ICD-10-CM | POA: Insufficient documentation

## 2024-03-12 LAB — COMPREHENSIVE METABOLIC PANEL
ALT: 230 U/L — ABNORMAL HIGH (ref 0–50)
AST: 123 U/L — ABNORMAL HIGH (ref 0–50)
Albumin: 4.3 g/dL (ref 3.5–5.2)
Alk Phos: 89 U/L (ref 40–130)
Anion Gap: 22 — ABNORMAL HIGH (ref 7–16)
Bilirubin,Total: 0.3 mg/dL (ref 0.0–1.2)
CO2: 19 mmol/L — ABNORMAL LOW (ref 20–28)
Calcium: 10.2 mg/dL (ref 9.0–10.3)
Chloride: 100 mmol/L (ref 96–108)
Creatinine: 0.33 mg/dL — ABNORMAL LOW (ref 0.67–1.17)
Glucose: 107 mg/dL — ABNORMAL HIGH (ref 60–99)
Lab: 7 mg/dL (ref 6–20)
Potassium: 4.1 mmol/L (ref 3.3–5.1)
Sodium: 141 mmol/L (ref 133–145)
Total Protein: 7.8 g/dL — ABNORMAL HIGH (ref 6.3–7.7)
eGFR BY CREAT: 158

## 2024-03-12 LAB — CBC AND DIFFERENTIAL
Baso # K/uL: 0 THOU/uL (ref 0.0–0.2)
Eos # K/uL: 0.2 THOU/uL (ref 0.0–0.5)
Hematocrit: 45 % (ref 37–52)
Hemoglobin: 14 g/dL (ref 12.0–17.0)
IMM Granulocytes #: 0 THOU/uL
IMM Granulocytes: 0.5 %
Lymph # K/uL: 2.4 THOU/uL (ref 1.0–5.0)
MCV: 95 fL (ref 75–100)
Mono # K/uL: 0.4 THOU/uL (ref 0.1–1.0)
Neut # K/uL: 4.7 THOU/uL (ref 1.5–6.5)
Nucl RBC # K/uL: 0 THOU/uL
Nucl RBC %: 0 /100{WBCs} (ref 0.0–0.2)
Platelets: 287 THOU/uL (ref 150–450)
RBC: 4.7 MIL/uL (ref 4.0–6.0)
RDW: 14.1 % (ref 0.0–15.0)
Seg Neut %: 60.2 %
WBC: 7.8 THOU/uL (ref 3.5–11.0)

## 2024-03-12 LAB — HUMAN ASTROVIRUS PCR: Human Astrovirus PCR: 0

## 2024-03-12 LAB — NOROVIRUS RNA PCR: Norovirus RNA PCR: 0

## 2024-03-12 LAB — ADENOVIRUS DNA PCR (STOOL ONLY): Adenovirus DNA PCR (stool): 0

## 2024-03-12 LAB — SAPOVIRUS PCR: Sapovirus PCR: 0

## 2024-03-12 LAB — ROTAVIRUS A PCR: Rotavirus A PCR: 0

## 2024-03-12 NOTE — Telephone Encounter (Signed)
 2015 FormsDate Uploaded Trans Type Expiration Date Status09/09/2023  Wheelchair 03/12/2025  Approved

## 2024-03-12 NOTE — Telephone Encounter (Signed)
 [facility] CENTER TELEPHONE INTAKEReason for call: Appointment was scheduled on 8/29 for today and transportation was not set upPatient needs 2015 form and appointment for today is @ 2:15 pmName of caller: KateRelationship to patient: RNPhone:  [phone]

## 2024-03-12 NOTE — Telephone Encounter (Signed)
 Per MAS: There is already a trip scheduled for this enrollee on 03/12/2024.

## 2024-03-13 LAB — HEMOGLOBIN A1C: Hemoglobin A1C: 5.2 % (ref <=5.6)

## 2024-03-13 NOTE — Progress Notes (Addendum)
 [facility] Subjective Angel Wells is a 33 y.o. male who presents for Diarrhea (Started 1 week ago  but improve/On Bland diet/) and Other (Wants flu and covid shot script for pharmacy\/Needs new Hospital Bed Respiratory sevice ([facility]) [phone] fax//PT Script for Outpatient PT - Needs for New wheelchair and exercises /[facility] Fax [phone]//[facility] - Epi pen /,Metformin  and Chest congestion/)History of Present IllnessThe patient presents for evaluation of diarrhea, quadriplegia, and hyponatremia.Diarrhea- The patient has been experiencing diarrhea since Tuesday.- 6 episodes on the first day, 9 on the second, and 8 on the third.- The frequency has since decreased, and there have been no episodes in the past 2 days.- No antibiotics have been prescribed.- A bland diet appeared to be beneficial - stool study came back yesterday with positive C. Diff but he has no longer had any more episodes of diarrhea, still with no stools either. Quadriplegia- He is scheduled to receive a new wheelchair so needs to prioritize PT for this certification. - He will be starting additional exercises.- He requires a new bed from [facility] .- It has been 5 years since his last bed, and he is now eligible for a replacement, it is broken without repair Hyponatremia- He has a longstanding diagnosis of hyponatremia.- Raises concerns during illness due to the risk of over-dilution from extra fluids.- Labs, including electrolytes and CMP, are planned to be drawn today.Diet: Bland diet Objective Blood pressure 101/55, pulse 92, temperature 36.6 C (97.8 F), temperature source Temporal, weight 76.4 kg (168 lb 6.4 oz), SpO2 93%.Physical ExamWell appearing no concerns Results  Assessment & Plan1. Diarrhea:- Signs of improvement, no episodes in the past 2 days- Current bland diet  effective, no antibiotic treatment necessary- Contact office immediately if symptoms recur (diarrhea, fever, abdominal distension)2. Quadriplegia:- Prescription for a new bed to be sent to [facility]- Diagnosis of quadriplegia, which requires positioning of the body in ways not feasible with an ordinary bed in order to alleviate pain and prevent chronic aspiration (elevation >30degrees). This cannot be accomplished with an ordinary bed or with modifications to an ordinary bed (wedge/pillow cannot support what he needs for positioning due to quadriplegia). 3. Hyponatremia:- Longstanding diagnosis of hyponatremia- Labs, including electrolytes and CMP, to be obtained today to monitor conditionAuthor: Rocky Almarie Richard, MD  Note signed: 03/13/2024

## 2024-03-14 ENCOUNTER — Encounter: Payer: Self-pay | Admitting: Student in an Organized Health Care Education/Training Program

## 2024-03-14 ENCOUNTER — Other Ambulatory Visit: Payer: Self-pay | Admitting: Student in an Organized Health Care Education/Training Program

## 2024-03-14 NOTE — Progress Notes (Signed)
 Incoming paperwork for completion to the Complex Care CenterDate Received:Date Paperwork was Intake: 03/14/2024 Type of paperwork: Sender/organization: PATIENT DROP OFF FORMForm type: NURSECORE - Physician OrderCompleted paperwork to be sent to:  Fax to Debbie [phone] in box for: HardyLauren N Snyder3:06 PM9/11/2023

## 2024-03-17 ENCOUNTER — Encounter: Payer: Self-pay | Admitting: Gastroenterology

## 2024-03-17 NOTE — Progress Notes (Signed)
 Outgoing paperwork:Completed paperwork received on  9/8/2025Sent to scanning? YesSent as outlined in prior documentation: Fax to [phone] N Snyder4:29 PM9/02/2024

## 2024-03-18 ENCOUNTER — Other Ambulatory Visit: Payer: Self-pay | Admitting: Student in an Organized Health Care Education/Training Program

## 2024-03-18 DIAGNOSIS — K219 Gastro-esophageal reflux disease without esophagitis: Secondary | ICD-10-CM

## 2024-03-19 ENCOUNTER — Encounter: Payer: Self-pay | Admitting: Student in an Organized Health Care Education/Training Program

## 2024-03-19 NOTE — Progress Notes (Signed)
 Incoming paperwork for completion to the Complex Care CenterDate Received:Date Paperwork was Intake: 03/19/2024 Type of paperwork: Sender/organization: NursecoreForm type: Missed Visit Note/Physician OrdersCompleted paperwork to be sent to:  Fax to [phone] in box for: HardyAngela Burns12:13 PM9/04/2024

## 2024-03-20 ENCOUNTER — Telehealth: Payer: Self-pay | Admitting: Student in an Organized Health Care Education/Training Program

## 2024-03-20 ENCOUNTER — Encounter: Payer: Self-pay | Admitting: Gastroenterology

## 2024-03-20 ENCOUNTER — Encounter: Payer: Self-pay | Admitting: Student in an Organized Health Care Education/Training Program

## 2024-03-20 DIAGNOSIS — G8250 Quadriplegia, unspecified: Secondary | ICD-10-CM

## 2024-03-20 DIAGNOSIS — R0902 Hypoxemia: Secondary | ICD-10-CM

## 2024-03-20 NOTE — Telephone Encounter (Addendum)
 Spoke with KateSuctioned this AM and noticed small amount of bright red bloodHas been requiring O2- 2L throughout today (not baseline) to maintain O2 above 90%When off of O2- drops into 80'sTemp 98.0 (states this is a fever for him)Has been doing vest treatmentsDiminished sounds in right lower lobeWheezingIncreased fatigue Mallie requesting chest x-ray from ultramobile imagingWorried about aspiration pneumonia Declining ED at this timeRouting to PCP and secure chat also Brion Range, RN

## 2024-03-20 NOTE — Telephone Encounter (Signed)
 Complex Care CenterPrior Authorization RequestDate of request: 9/11/2025Time of call: 11:03 AMMedication Requiring PA:EPINEPHrine  (EPIPEN ) 0.3 mg/0.3 mL auto-injector Directions(dosing and frequency): EPINEPHrine  (EPIPEN ) 0.3 mg/0.3 mL auto-injector Quantity/ Day Supply:2 each Dx code: T78.0Request received from:CCC PA Source: PharmacyName and Phone Number of Pharmacy:[facility] [place], [place] - [address] [place] [address]Eynwz: [phone]  Fax: [phone] Please run secondary if not covered under primary: yes   Insurance coverage confirmed: yesCovered Alternative Medication: Tax ID: [id number] Provider and NPI: [id number] Additional Justification:n/aKey ID: [id number] provide justification for PA submission & urgency or indicate which covered alternative was orderedThis submission is for continuation of pain management therapy on which the patient has been stable and prevents hospitalization for the manifestations of underlying sickle cell disease.  Per the CDC statement in April 2019 opioid therapy is clinically indicated in the care of patients with sickle cell disease and their recommendations should NOT be utilized to deny these patients access to opioid therapy.  Lack of access to this medication will result in increased hospital utilization, increase risk of significant morbidity and mortality.

## 2024-03-20 NOTE — Telephone Encounter (Signed)
 [facility]ARE CENTER TELEPHONE INTAKEReason for call: Mallie states patient has low fever, oxygen  low 80's, keeps oxygen  on all day, and may have a mucus plugKate thinks patient may need x-rayKate would like to speak with nurse/PCP as soon as possibleName of caller: KateRelationship to patient: PDNPhone:  [phone]

## 2024-03-20 NOTE — Progress Notes (Signed)
 Outgoing paperwork:Completed paperwork received on  9/11/2025Sent to scanning? YesSent as outlined in prior documentation: Fax to [phone] N Snyder3:36 PM9/05/2024

## 2024-03-20 NOTE — Progress Notes (Signed)
 Incoming paperwork for completion to the Complex Care CenterDate Received:Date Paperwork was Intake: 03/20/2024 Type of paperwork: Sender/organization: Health System ServicesForm type: Detailed Written Order Date 9/10/25Completed paperwork to be sent to:  Fax to [phone] in box for: HardyAngela Burns10:38 AM9/05/2024

## 2024-03-20 NOTE — Progress Notes (Signed)
 Outgoing paperwork:Completed paperwork received on  9/11/2025Sent to scanning? YesSent as outlined in prior documentation: Fax to [phone] Tinnie SAILOR Snyder3:30 PM9/05/2024

## 2024-03-21 NOTE — Addendum Note (Signed)
 Addended by: LAWERNCE FALLOW on: 03/21/2024 04:24 PM Modules accepted: Orders

## 2024-03-25 ENCOUNTER — Other Ambulatory Visit: Payer: Self-pay | Admitting: Gastroenterology

## 2024-03-25 NOTE — Telephone Encounter (Addendum)
 Called and spoke to [place].States Angel Wells has been doing ok.Relayed that CXR has been ordered and faxed to [facility]. Verbalized understanding. Also requesting follow-up on scripts/referrals and from 8/14 encounter. Still need script for hospital bed. Order from Dr. Lawernce on 8/21 pended but awaiting authorization.Also requested from [facility] on 9/3 by Dr. Metta.Referral for PT from [facility] was faxed 03/12/24 by Dr. Metta. Routing to PSS and Dr. Metta.Angel Stanislawski, RN

## 2024-03-27 ENCOUNTER — Telehealth: Payer: Self-pay | Admitting: Student in an Organized Health Care Education/Training Program

## 2024-03-27 DIAGNOSIS — A0472 Enterocolitis due to Clostridium difficile, not specified as recurrent: Secondary | ICD-10-CM

## 2024-03-27 MED ORDER — FIDAXOMICIN 200 MG PO TABS *I*
200.0000 mg | ORAL_TABLET | Freq: Two times a day (BID) | ORAL | 0 refills | Status: AC
Start: 2024-03-27 — End: 2024-04-06

## 2024-03-27 NOTE — Telephone Encounter (Signed)
 Called and spoke with [facility] was home, but got a call from pts parentsStates the last 2 days pt has had about 7 loose, yellow stoolsPt is afebrile, but complaining that he doesn't feel wellDad also tells kate that parents are having stomach upsetFamily is concerned that CDIFF is active againPt tested positive on the 3rd of this month and treated this with a bland diet and hydration (pt is allergic to vancomycin)As far as Mallie know, pt has still been eating and hydratingKate will be back at work tomorrow with ptAgreed nursing will touch base tomorrowKate also inquiring about recent chest xray results

## 2024-03-27 NOTE — Telephone Encounter (Signed)
 [facility] TELEPHONE INTAKEReason for call: Mallie states patient has had 7 loose stools in the last 2 daysKate states C-Diff may have came backKate would like to speak with nurse/PCPName of caller: KateRelationship to patient: PDN Phone:  [phone]

## 2024-03-27 NOTE — Addendum Note (Signed)
 Addended by: Carlito Bogert on: 03/27/2024 05:30 PM Modules accepted: Orders

## 2024-03-27 NOTE — Telephone Encounter (Signed)
 Called and spoke with Angel Wells. When we had seen him in the office, he had resolved symptoms in the setting of a bland diet, so chose not to start treatment but now with discomfort and return of diarrhea, we will start treatment. He has an allergy to vancomycin so we will start fidaxomicin  200mg  q12 for 10 days Angel Wells agreeable and will talk to dad. Dr. Metta

## 2024-03-28 ENCOUNTER — Ambulatory Visit: Payer: Self-pay | Admitting: Primary Care

## 2024-04-02 ENCOUNTER — Other Ambulatory Visit: Payer: Self-pay | Admitting: Oncology

## 2024-04-02 ENCOUNTER — Other Ambulatory Visit: Payer: Self-pay | Admitting: Student in an Organized Health Care Education/Training Program

## 2024-04-02 ENCOUNTER — Encounter: Payer: Self-pay | Admitting: Student in an Organized Health Care Education/Training Program

## 2024-04-02 DIAGNOSIS — R569 Unspecified convulsions: Secondary | ICD-10-CM

## 2024-04-02 DIAGNOSIS — B372 Candidiasis of skin and nail: Secondary | ICD-10-CM

## 2024-04-02 NOTE — Progress Notes (Signed)
 Incoming paperwork for completion to the [facility] CenterDate Received:Date Paperwork was Intake: 04/02/2024 Type of paperwork: Sender/organization: [employer]Form type: Physician Order - Fidaxomicin  Completed paperwork to be sent to:  Fax to [phone] Placed in box for: Angel N Snyder10:20 AM9/24/2025

## 2024-04-03 ENCOUNTER — Encounter: Payer: Self-pay | Admitting: Gastroenterology

## 2024-04-03 MED ORDER — GENERIC DME *A*
0 refills | Status: AC
Start: 2024-04-03 — End: ?

## 2024-04-03 NOTE — Telephone Encounter (Signed)
 Ok found it, I thought the referrals were active but they were just pended. I went back to the original encounter from 8/14 and signed them. Script for hospital bed also placed in PSS bin. Sorry I did not catch that when I saw him. Angel Wells

## 2024-04-03 NOTE — Addendum Note (Signed)
 Addended by: Priscille Shadduck on: 04/03/2024 07:31 PM Modules accepted: Orders

## 2024-04-04 ENCOUNTER — Other Ambulatory Visit: Payer: Self-pay | Admitting: Student in an Organized Health Care Education/Training Program

## 2024-04-04 NOTE — Progress Notes (Signed)
 Outgoing paperwork:Completed paperwork received on  9/26/2025Sent to scanning? YesSent as outlined in prior documentation: Fax to [phone] Jon Burns1:45 PM9/26/2025

## 2024-04-04 NOTE — Telephone Encounter (Addendum)
 Outgoing paperwork:Completed paperwork received on  9/26/2025Sent to scanning? NoSent as outlined in prior documentation: Fax to [employer] [phone] Burns11:30 AM9/26/2025

## 2024-04-08 ENCOUNTER — Encounter: Payer: Self-pay | Admitting: Student in an Organized Health Care Education/Training Program

## 2024-04-08 ENCOUNTER — Other Ambulatory Visit: Payer: Self-pay | Admitting: Student in an Organized Health Care Education/Training Program

## 2024-04-08 ENCOUNTER — Other Ambulatory Visit: Payer: Self-pay | Admitting: Primary Care

## 2024-04-08 NOTE — Progress Notes (Signed)
 Incoming paperwork for completion to the [facility] Care CenterDate Received:Date Paperwork was Intake: 04/08/2024 Type of paperwork: Sender/organization: [employer]Form type: 04/08/24 Practitioner Note needs to be addend to justify [facility] specific/special request? Fax resent office note Copy and scan before mailing? NoCompleted paperwork to be sent to:  Fax to [phone] in box for: GoodAngela Burns9:17 AM9/30/2025

## 2024-04-10 ENCOUNTER — Other Ambulatory Visit: Payer: Self-pay | Admitting: Student in an Organized Health Care Education/Training Program

## 2024-04-11 ENCOUNTER — Other Ambulatory Visit: Payer: Self-pay | Admitting: Student in an Organized Health Care Education/Training Program

## 2024-04-11 DIAGNOSIS — J9611 Chronic respiratory failure with hypoxia: Secondary | ICD-10-CM

## 2024-04-11 DIAGNOSIS — Z930 Tracheostomy status: Secondary | ICD-10-CM

## 2024-04-11 NOTE — Progress Notes (Signed)
 Call from [place] requesting a back up suction as they have had issues with primary suction and there are concerns if it goes down, [name] could be in big trouble. Order pended for Dr. Lawernce and will be sent to [facility] upon signing.

## 2024-04-14 ENCOUNTER — Telehealth: Payer: Self-pay | Admitting: Student in an Organized Health Care Education/Training Program

## 2024-04-14 MED ORDER — NYSTATIN 100000 UNIT/ML MT SUSP *I*
500000.0000 [IU] | Freq: Four times a day (QID) | OROMUCOSAL | 1 refills | Status: DC
Start: 2024-04-14 — End: 2024-04-16

## 2024-04-15 NOTE — Progress Notes (Signed)
 Outgoing paperwork:Completed paperwork received on  10/7/2025Sent to scanning? YesSent as outlined in prior documentation: Fax to [phone] Montulli1:55 PM10/01/2024

## 2024-04-16 ENCOUNTER — Telehealth: Payer: Self-pay | Admitting: Student in an Organized Health Care Education/Training Program

## 2024-04-16 NOTE — Addendum Note (Signed)
 Addended by: WILHELMINA DOMINO on: 04/16/2024 02:38 PM Modules accepted: Orders

## 2024-04-16 NOTE — Telephone Encounter (Signed)
 [facility] TELEPHONE TRIAGE Reason for call: Mallie inquired if patient may receive an extension for patient mouthwash. Mallie believes patient may benefit from continuing to take the mediation. Mallie inquired if she may send an updated photo of patients tongue to nurse phone.Name of caller: Mallie for Joshiah AgostoRelationship to patient: PDNPhone: [phone]

## 2024-04-16 NOTE — Telephone Encounter (Signed)
 Called and spoke with [facility] for an extension of the Nystatin  mouthwash for pt Was only prescribed 3 days worthStates it has really helped with pts tongue Will route to provider to advise

## 2024-04-17 ENCOUNTER — Encounter: Payer: Self-pay | Admitting: Student in an Organized Health Care Education/Training Program

## 2024-04-17 NOTE — Progress Notes (Signed)
 Incoming paperwork for completion to the [facility] Care CenterDate Received:Date Paperwork was Intake: 04/17/2024 Type of paperwork: Sender/organization: [employer]Form type: Revision to Plan of Care Completed paperwork to be sent to:  Fax to [phone] Placed in box for: HardyLauren N Snyder3:57 PM10/03/2024

## 2024-04-17 NOTE — Progress Notes (Signed)
 Incoming paperwork for completion to the [facility] Care CenterDate Received:Date Paperwork was Intake: 04/17/2024 Type of paperwork: Sender/organization: [employer]Form type: POC ordersAny specific/special request? noCopy and scan before mailing? YesCompleted paperwork to be sent to:  Fax to [phone] in box for: HardyHeather L Fico10:49 AM10/03/2024

## 2024-04-18 ENCOUNTER — Telehealth: Payer: Self-pay

## 2024-04-18 ENCOUNTER — Encounter: Payer: Self-pay | Admitting: Student in an Organized Health Care Education/Training Program

## 2024-04-18 MED ORDER — NYSTATIN 100000 UNIT/ML MT SUSP *I*
500000.0000 [IU] | Freq: Four times a day (QID) | OROMUCOSAL | 1 refills | Status: AC
Start: 2024-04-18 — End: ?

## 2024-04-18 NOTE — Telephone Encounter (Signed)
 Called and spoke to [facility] that nystatin  mouthwash order has been extendedAdvised to reach out if symptoms do not resolveAcknowledged understanding

## 2024-04-18 NOTE — Progress Notes (Signed)
 Incoming paperwork for completion to the Complex Care CenterDate Received:Date Paperwork was Intake: 04/18/2024 Type of paperwork: Sender/organization: [employer]Form type:  Revision to Plan of Care - Nystatin  SwishCompleted paperwork to be sent to:  Fax to [phone] in box for: Angel N Snyder5:09 PM10/04/2024

## 2024-04-18 NOTE — Addendum Note (Signed)
 Addended by: LAWERNCE FALLOW on: 04/18/2024 02:17 PM Modules accepted: Orders

## 2024-04-18 NOTE — Telephone Encounter (Signed)
 Received message from [employer] about Angel Wells's cough assist making vibrations when using it. I returned her call and we figured out that the oscillation feature had been inadvertently turned on. She turned it off and it was working fine.

## 2024-04-22 ENCOUNTER — Encounter: Payer: Self-pay | Admitting: Gastroenterology

## 2024-04-22 ENCOUNTER — Encounter: Payer: Self-pay | Admitting: Student in an Organized Health Care Education/Training Program

## 2024-04-22 NOTE — Progress Notes (Signed)
 Incoming and Outgoing paperwork for completion to the [facility] Care CenterDate Received:Date Paperwork was Intake and Faxed : 04/22/2024 Type of paperwork: Sender/organization: [facility] [place]Form type: Practitioner NoteAny specific/special request? Faxed over addend note for 8/8/25Copy and scan before mailing? NoCompleted paperwork to be sent to:  Fax to [phone] Burns2:37 PM10/14/2025

## 2024-04-23 ENCOUNTER — Other Ambulatory Visit: Payer: Self-pay | Admitting: Oncology

## 2024-04-23 NOTE — Progress Notes (Signed)
 Outgoing paperwork:Completed paperwork received on  10/15/2025Sent to scanning? YesSent as outlined in prior documentation: Fax to [phone] Jon Burns2:53 PM10/15/2025

## 2024-04-23 NOTE — Progress Notes (Signed)
 Outgoing paperwork:Completed paperwork received on  10/15/2025Sent to scanning? YesSent as outlined in prior documentation: Fax to [phone] Jon Burns3:43 PM10/15/2025

## 2024-04-24 ENCOUNTER — Encounter: Payer: Self-pay | Admitting: Student in an Organized Health Care Education/Training Program

## 2024-04-24 ENCOUNTER — Encounter: Payer: Self-pay | Admitting: Gastroenterology

## 2024-04-24 NOTE — Progress Notes (Signed)
 Incoming paperwork for completion to the Complex Care CenterDate Received:Date Paperwork was Intake: 04/24/2024 Type of paperwork: Sender/organization: [facility] ServicesForm type: Detailed Written Order - 8 pagesCompleted paperwork to be sent to:  Fax to [phone] in box for: HardyLauren N Snyder2:08 PM10/16/2025

## 2024-04-25 NOTE — Progress Notes (Signed)
 Outgoing paperwork:Completed paperwork received on  10/17/2025Sent to scanning? YesSent as outlined in prior documentation: Fax to [phone] Jon Burns3:50 PM10/17/2025

## 2024-04-28 ENCOUNTER — Other Ambulatory Visit: Payer: Self-pay | Admitting: Student in an Organized Health Care Education/Training Program

## 2024-05-07 ENCOUNTER — Other Ambulatory Visit: Payer: Self-pay | Admitting: Student in an Organized Health Care Education/Training Program

## 2024-05-09 ENCOUNTER — Other Ambulatory Visit: Payer: Self-pay | Admitting: Student in an Organized Health Care Education/Training Program

## 2024-05-09 DIAGNOSIS — Z794 Long term (current) use of insulin: Secondary | ICD-10-CM

## 2024-05-09 MED ORDER — ACCU-CHEK FASTCLIX LANCETS MISC
5 refills | Status: AC
Start: 2024-05-09 — End: ?

## 2024-05-14 ENCOUNTER — Other Ambulatory Visit: Payer: Self-pay | Admitting: Student in an Organized Health Care Education/Training Program

## 2024-05-14 DIAGNOSIS — E119 Type 2 diabetes mellitus without complications: Secondary | ICD-10-CM

## 2024-05-15 ENCOUNTER — Encounter: Payer: Self-pay | Admitting: Gastroenterology

## 2024-05-15 MED ORDER — SIMPLE DIAGNOSTICS LANCING DEV MISC
2 refills | Status: DC
Start: 2024-05-15 — End: 2024-05-16

## 2024-05-15 NOTE — Telephone Encounter (Signed)
 Refer to other messaging

## 2024-05-16 ENCOUNTER — Other Ambulatory Visit: Payer: Self-pay | Admitting: Student in an Organized Health Care Education/Training Program

## 2024-05-16 DIAGNOSIS — M2450 Contracture, unspecified joint: Secondary | ICD-10-CM

## 2024-05-16 MED ORDER — SIMPLE DIAGNOSTICS LANCING DEV MISC
2 refills | Status: AC
Start: 2024-05-16 — End: ?

## 2024-05-16 NOTE — Addendum Note (Signed)
 Addended by: Corderro Koloski on: 05/16/2024 09:47 AM Modules accepted: Orders

## 2024-05-21 ENCOUNTER — Encounter: Payer: Self-pay | Admitting: Student in an Organized Health Care Education/Training Program

## 2024-05-21 NOTE — Progress Notes (Signed)
 Incoming paperwork for completion to the [facility] Care CenterDate Received:Date Paperwork was Intake: 05/21/2024 Type of paperwork: Sender/organization: [employer] Form type: Certification of Medical NecessityAny specific/special request? noCopy and scan before mailing? YesCompleted paperwork to be sent to:  Fax to [phone] in box for: HardyMargaret Moses9:12 AM11/06/2024

## 2024-05-22 NOTE — Progress Notes (Signed)
 Outgoing paperwork:Completed paperwork received on  11/13/2025Sent to scanning? YesSent as outlined in prior documentation:  Fax to [phone] Jon Burns10:53 AM11/13/2025

## 2024-05-28 ENCOUNTER — Telehealth: Payer: Self-pay | Admitting: Student in an Organized Health Care Education/Training Program

## 2024-05-28 NOTE — Telephone Encounter (Signed)
 [facility] TELEPHONE INTAKEReason for call: Alisa states patient is having thick clumpy mucous with bad breath possible sinus infectionGail would like to speak with nurse/PCPName of caller: GailRelationship to patient: RNPhone:  [phone]

## 2024-05-29 NOTE — Telephone Encounter (Signed)
 Called and spoke with GailReports that pt has had about a week now of symptoms:HeadachesOccasional desaturationsNeed for increased suctioningBad breathSeizure yesterdayBGs have been stable, but this morning it was a little higher than normal at 152They have been doing increased airway clearanceNo fevers Booked for assessment in acute appointment tomorrow (pt couldn't come this morning to appointment offered)[name] will call prior to appointment with any concerns

## 2024-05-30 ENCOUNTER — Ambulatory Visit
Attending: Student in an Organized Health Care Education/Training Program | Admitting: Student in an Organized Health Care Education/Training Program

## 2024-05-30 ENCOUNTER — Ambulatory Visit

## 2024-05-30 ENCOUNTER — Other Ambulatory Visit: Payer: Self-pay

## 2024-05-30 VITALS — BP 108/57 | HR 82 | Temp 97.9°F | Wt 165.8 lb

## 2024-05-30 DIAGNOSIS — B9689 Other specified bacterial agents as the cause of diseases classified elsewhere: Secondary | ICD-10-CM | POA: Insufficient documentation

## 2024-05-30 DIAGNOSIS — E119 Type 2 diabetes mellitus without complications: Secondary | ICD-10-CM | POA: Insufficient documentation

## 2024-05-30 DIAGNOSIS — J019 Acute sinusitis, unspecified: Secondary | ICD-10-CM | POA: Insufficient documentation

## 2024-05-30 MED ORDER — GENERIC DME *A*
Status: AC
Start: 2024-05-30 — End: ?

## 2024-05-30 MED ORDER — DOXYCYCLINE HYCLATE 100 MG PO TABS *I*
100.0000 mg | ORAL_TABLET | Freq: Two times a day (BID) | ORAL | 0 refills | Status: AC
Start: 2024-05-30 — End: 2024-06-04

## 2024-05-30 MED ORDER — ZINC OXIDE 40 % EX PSTE *I*
PASTE | CUTANEOUS | 1 refills | Status: AC | PRN
Start: 2024-05-30 — End: ?

## 2024-05-30 NOTE — Progress Notes (Signed)
 [facility] - Comprehensive Medication Management Angel Wells is a 33 y.o. male who presents for co-visit with PCP and clinical pharmacist. Patient's medications, preferred pharmacy and allergies were updated and marked as reviewed, as appropriate.       Vitals:  05/30/24 1357 BP: 108/57 Pulse: 82 Temp: 36.6 C (97.9 F) Weight: 75.2 kg (165 lb 12.8 oz)  Additional labs/vitals as documented in chart - reviewed all relevant objective info at time of visit.Current Outpatient Medications Medication Sig Dispense Refill  Lancet Devices (SIMPLE DIAGNOSTICS LANCING DEV) MISC Use to test blood sugar 3 times daily. Diagnosis code: E11.9 - Type 2 diabetes mellitus. Brand: Fast Clix Lancet Device 90 each 2  metFORMIN  (GLUCOPHAGE ) 500 MG/5ML solution administer 10 mls (1,000 milligram total) via g tube 2 times daily (with meals) 473 mL 0  baclofen  (LIORESAL ) 10 mg tablet take 1 tablet by mouth 2 times daily 180 tablet 0  INVOKANA  300 MG tablet take 1 tablet by mouth daily (before breakfast). 30 tablet 0  SENNA 8.6 MG tablet administer 2 tablets via g tube daily 180 tablet 1  Accu-Chek FastClix Lancets MISC 1 EACH BY MISC.(NON-DRUG COMBO ROUTE) ROUTE 3 (THREE) TIMES DAILY. E11.65 102 each 5  azelastine  (ASTELIN ) 0.1 % nasal spray apply 2 sprays in each nostril every morning and then in the afternoon as needed 30 mL 0  nystatin  (MYCOSTATIN ) 100,000 Units/mL suspension Swish and swallow 5 mLs (500,000 units total) 4 times daily for Candidiasis Fungal Infection of the Oropharynx. Swish and hold in mouth before swallowing. Use for at least 7 days until symptoms resolve. 80 mL 1  ANTACID ULTRA STRENGTH 1000 MG chewable tablet place 1 tablet into mouth, chew and swallow daily. 30 tablet 0  levETIRAcetam  (KEPPRA ) 100 mg/mL solution give 15 milliliter by g-tube twice daily 1050 mL 2  fluticasone  (FLONASE ) 50 MCG/ACT nasal spray spray 1 spray into nostril daily  16 mL 2  generic DME Item to dispense: [facility] Bed; Use as directed as needed. Diagnosis codes: G82.50, Z87.820, Z99.11 1 each 0  generic DME Item to dispense: Hospital bed. (Current hospital bed is broken without ability to repair).  Instructions: use for diagnosis of quadriplegia. 1 each 0  ketoconazole  (NIZORAL ) 2 % cream apply topically daily. to the following areas: right and left armpit 60 g 5  bisacodyl  (DULCOLAX) 10 mg suppository please give suppository as needed if no bowel movement in 3 days, please check with dad prior to giving 12 suppository 5  valproate (VALPROIC ACID ) 50 mg/mL oral solution take 5 MILLILITER by mouth 3 times daily 1350 mL 5  SILACE 150 MG/15ML syrup take 10 mls by mouth 3 times daily hold for loose stool 900 mL 5  blood glucose (ACCU-CHEK GUIDE) test strip USE 1 STRIP 3 TIMES A DAY 100 each 5  famotidine  (PEPCID ) 20 mg tablet take 1 tablet by mouth 2 times daily. ok to crush it and put it down tube if necessary 180 tablet 3  EPINEPHrine  (EPIPEN ) 0.3 mg/0.3 mL auto-injector use as directed 2 each 3  guaifenesin  (HUMIBID E) 400 mg TABS take 1 tablet by mouth 3 times daily. 90 tablet 3  albuterol  (PROVENTIL ) (2.5 mg/22mL) 0.083% nebulizer solution INHALE 3 MLS BY NEBULIZATION 4 TIMES DAILY FOR SPASM OF LUNG AIR PASSAGES WITH VESTING. WHEN ILL, USE FOUR TIMES A DAY WITH VESTING. 1050 mL 5  ipratropium (ATROVENT ) 0.02 % nebulizer solution NEBULIZE 1 UNIT DOSE THREE TIMES A DAY, MAY GIVE 1 ADDITIONAL DOSE ONCE DAILY  IS NEEDED FOR WHEEZING/COUGH/RESPIRATORY DISTRESS 312.5 mL 5  surgical lubricant (SURGILUBE) gel Apply topically as needed (trach changes). 31 g 2  carbamide peroxide (DEBROX) 6.5 % otic solution use 5 drops twice daily on the first 3 days of each month. 15 mL 2  sodium chloride  0.9 % nebulizer solution inhale 5 mls by nebulization 3 times daily 500 mL 0  generic DME Item to dispense: Item to dispense: Bivona silicone cuffed trach  tube size 6.0    Instructions for use: for back up use in case of difficult insertion to maintain airway 1 each 0  clobetasol  (TEMOVATE ) 0.05 % ointment Apply two times a day to behind ears for yellow flakes. Use for 7 days once started. 45 g 0  chlorhexidine  (PERIDEX ) 0.12 % solution use as directed 473 mL 0  lacosamide  (VIMPAT ) 10 mg/mL SOLN oral solution Take 10 MILLILITER per tube twice daily. ; MAXIMUM DAILY DOSE 20 MILLILITER 600 mL 5  cetirizine  (ZYRTEC ) 10 mg tablet administer 1 tablet (10 MILLIGRAM total) via g tube nightly. 90 tablet 1  budesonide  (PULMICORT ) 0.25 mg/47mL nebulizer suspension Inhale 2 mLs (0.25 mg total) by nebulization 2 times daily. 360 mL 5  zinc  oxide (DESITIN) 40 % paste Apply topically 2 times daily. 113 g 1  nystatin  (MYCOSTATIN ) ointment apply topically with each diaper change as needed for diaper rash. to the following areas: gtube site after mild cleansing 15 g 0  nystatin  (MYCOSTATIN ) powder apply topically 4 times daily to the following areas: neck, groin, axilla 30 g 0  miconazole  (MICATIN) 2 % powder Apply topically 2 times daily as needed. 71 g 0  MIC-KEY gastrostomy kit 14FR Please dispense every 3 months and prn, MicKey 14Fr 2.5cm 1 kit 3  MIC-KEY gastrostomy feeding tube extension set 24 Use as directed, please dispense 4 per month 4 kit 5  clonazePAM  (KLONOPIN ) 1 mg tablet take 1 tablet by mouth twice daily as needed (crush for g-tube) for seizures; maximum daily dose 2 tabs 10 tablet 0  Multiple Vitamins-Minerals (MULTIVITAMIN GUMMIES ADULTS PO) Take by mouth daily. 2 gummies one a day nutritional supplement    B-D UF III MINI PEN NEEDLES 31G X 5 MM USE 3 (THREE) TIMES DAILY AS NEEDED. 100 each 5  Glucagon  (GVOKE HYPOPEN  1-PACK) 1 MG/0.2ML SOAJ for Disorder with Low Blood Sugar. Use as needed for blood glucose < 70 1 each 5  lactobacillus rhamnosus, GG, (CULTURELLE) capsule Take 2 capsules (2 each total) by mouth daily. 30 capsule 5   Lactulose 20 gm/30mL solution administer 30 mls (20 g total) via g tube daily as needed (no stool in 48 hours) use as directed 946 mL 5  Probiotic Product (PROBIOTIC GUMMIES) 30 MG CHEW Take 30 mg by mouth daily. 2  tablets/gummies as provided by family-digestive health    EPINEPHrine  (EPIPEN  JR) 0.15 mg/0.3 mL auto-injector Inject 0.3 mLs (0.15 mg total) into the muscle as needed for Anaphylaxis.    sodium chloride  (OCEAN) 0.65 % nasal spray Spray 1 spray into each nostril 2 times daily as needed for Congestion 45 mL 5  generic DME Item to dispense: Bivona silicone cuffed trach tube size 7 80 mm length  Instructions for use: Use as directed 2 each 5  generic DME Vent settings:SIMV/PC/PS, RATE 10 IP 21. PS 13, PEEP 7, HIGH PRESSURE 50, LOW PRESSURE 5, HIGH RR 40, LOW RR14 1 each 0  oxygen  1-2 L/min to keep O2 sat above 92%, may increase up to 4 lpm as  needed.  Low saturation alarm 85%, high alarm off, low HR 50, High HR 200 1 each 0  generic DME [place] Percussion Vest Model # 105  55inchUse as directed. 1 each 0  cholecalciferol (VITAMIN D ) 1,000 unit tablet Take 1 tablet (1,000 units total) by mouth daily. 2 chews or tablet via G-tube- for total dose of 2,000 units.    triamcinolone  (KENALOG ) 0.025 % cream Apply topically 2 times daily  APPLY TO AFFECTED AREA (Patient not taking: Reported on 02/15/2024) 15 g 2  Ascorbic Acid (VITAMIN C ADULT GUMMIES PO) Take 250 mg by mouth daily  Give 2 gummies once daily     insulin  pen needle (FIFTY50 PEN NEEDLES) 31G X 8 MM 1 each   No current facility-administered medications for this visit.   Assessment and Plan:With regards to medication management for this patient, the following aspects were reviewed: Indication, effectiveness, safety and convenience of each medication. The patient's medical conditions and medications were assessed, evaluated, and deemed meeting goals of drug therapy except as listed below: Type 2 DiabetesThe  patient's last A1C on 03/12/24 = 5.2%, the patient is: at goal. Fasting/post-prandial BG levels at goal. Current antihyperglycemic therapy of Metformin  1000 mg BID + Canagliflozin  300mg  daily is appropriateFPG's </= 140mg /dLPP's generally 140mg /dL, some values elevated at 177 and some lower at 96Plan: repeat A1c in December, continue current regimenSinus InfectionTreat with Doxycycline  100mg  BID x 5 daysFocal EpilepsyManaged by neurologyPlanRecommendations were discussed with Provider (Dr. Lawernce) during appointment. Plan for implementation of medication changes are documented in Provider's note. Patient was able to verbalize understanding and repeat back key educational points.Follow-up: 3 months Thank you for allowing me to participate in the care of this patient. Please contact me with any questions. Angel Wells, PharmD., BCCCPClinical Pharmacist Practitioner, CPPUR Medicine[facility] [address], [place], [place] [phone]

## 2024-05-30 NOTE — Progress Notes (Signed)
 [facility] Subjective Angel Wells is a 33 y.o. male who presents for No chief complaint on file.History of Present IllnessThe patient presents for evaluation blood sugar management, sinusitis, and medication management.The accompanying adult male reports that the patient experienced desaturation episodes during her absence, which she attributes to a mucus plug. Upon her return, his oxygen  saturation levels were in the 89 to 90 range, prompting her to perform suctioning. This intervention resulted in an increase in his oxygen  saturation to 95 to 96. She does not believe antibiotic therapy is necessary at this time. She also mentions a suspicion of a sinus infection due to occasional halitosis and a distinct odor reminiscent of Pseudomonas detected during a tracheostomy change. His lungs were clear upon auscultation this morning. He was last suctioned approximately 20 minutes prior to the visit.His blood glucose levels have been well-controlled, with fasting levels typically below 130 and postprandial levels generally below 140, although there have been instances of readings as high as 177 or 153. There have been no observed signs of hypoglycemia, with the exception of one episode last month when his blood glucose level dropped to 80 or 90, but he managed well without the need for additional sugar intake. His weight has remained stable.His skin condition is generally good, with the exception of seborrheic dermatitis in his ears and flaking on his eyelashes. He has been experiencing excoriation in the groin and penis area, which is sometimes pink, moist, and shiny, but does not appear to cause him significant discomfort. The accompanying adult male suspects this may be due to prolonged diaper use and infrequent changes by one of the caregivers. She reports that clobetasol  has been effective for his ear condition but expresses concern about its use in the genital area. She has  Vaseline and Desitin available for use.She requests an order for Tylenol  to be administered prior to sleep, as this is a practice followed by his father. She also inquires about the possibility of reducing his probiotic dosage from two gummies to one per day, as his bowel movements have been regular on the current regimen. Objective Blood pressure 108/57, pulse 82, temperature 36.6 C (97.9 F), temperature source Temporal, weight 75.2 kg (165 lb 12.8 oz), SpO2 92%.Physical ExamEars: Seborrheic dermatitis noted.Nose: Purulence noted in the nasal passages.Respiratory: Lungs are clear at the bases and edges, with some gurgling sounds noted in the upper areas.ResultsLabs - Fasting blood sugar: Less than 130 for the most part, some are a little bit elevated - Postprandial blood sugar: Less than 140 for the most part, some are a little bit higher at 177 or 153  Assessment & Plan1. Sinus infection:- There is purulence noted in the nasal passages.- Antibiotics will be initiated without MRSA coverage initially.- Doxycycline  100 mg twice daily for 5 days has been prescribed and sent to the pharmacy. If symptoms do not improve, antibiotics with MRSA coverage will be considered.2. Blood sugar management:- Blood glucose levels have been stable on the current oral medications.- Fasting levels are mostly <130, and postprandial levels are mostly <140. The target A1c is 6% or lower, and it has been in the 5% range for an extended period.- An A1c test will be conducted in 06/2024.- No changes to the current regimen are necessary at this time.3. Excoriation in the groin and penis:- The condition appears to be a form of dermatitis, likely due to moisture accumulation.- Desitin is recommended to be applied topically twice daily as needed for skin irritation.4. Medication management:- It is  acceptable to administer Tylenol  just prior to sleep. The probiotic dosage  can be reduced to one gummy per day, with the option to increase to two if diarrhea occurs.Follow-up: The patient will follow up in January 2026.I personally spent 30 minutes on the calendar day of the encounter, including pre and post visit work. Author: Elsie Mayotte, DO  Note signed: 06/10/2024

## 2024-06-09 ENCOUNTER — Telehealth: Payer: Self-pay | Admitting: Student in an Organized Health Care Education/Training Program

## 2024-06-09 NOTE — Telephone Encounter (Signed)
 [facility] TELEPHONE TRIAGE Reason for call: Mallie requests to speak to a nurse to provide update regarding how patient is doing with recently prescribed antibiotics. Name of caller: Mallie for Angel AgostoRelationship to patient: PDNPhone: [phone]

## 2024-06-10 ENCOUNTER — Other Ambulatory Visit: Payer: Self-pay | Admitting: Student in an Organized Health Care Education/Training Program

## 2024-06-11 ENCOUNTER — Other Ambulatory Visit: Payer: Self-pay | Admitting: Student in an Organized Health Care Education/Training Program

## 2024-06-11 NOTE — Telephone Encounter (Signed)
 Called and spoke to [facility] pt is feeling better after antibioticsNo longer complaining of fevers or nasal drainagePt still complaining of consistent sinus painNot requesting new medications, will wait to see if pt continues to improvePt also has a small blister on the R 5th toeNo swelling, redness, drainage, or warmth to touchRN keeping area clean, dry, and open to airWill send a picture todayAdvised to notify [facility] if area develops any signs of infectionNo further needs at this timeAdvised to call office with any questions, concerns, or acute changesAcknowledged understandingWill route to PCP

## 2024-06-11 NOTE — Telephone Encounter (Signed)
 Writer spoke to [place]. Mallie requests return call from a nurse today.[phone]

## 2024-06-11 NOTE — Telephone Encounter (Signed)
 Pended incontinence prescriptions to be signed and faxed to [facility], [phone], with [address] demographic sheet. [address], [place] [address]Eynwz: [phone]: [phone]

## 2024-06-12 ENCOUNTER — Encounter: Payer: Self-pay | Admitting: Student in an Organized Health Care Education/Training Program

## 2024-06-12 ENCOUNTER — Ambulatory Visit

## 2024-06-12 NOTE — Progress Notes (Signed)
 Incoming paperwork for completion to the Complex Care CenterDate Received:Date Paperwork was Intake: 06/12/2024 Type of paperwork: Sender/organization: [employer] type: Practitioner Standard Written Order - 2 Pages to SignCompleted paperwork to be sent to:  Fax to [phone] in box for: HardyLauren N Snyder3:34 PM12/10/2023

## 2024-06-12 NOTE — Telephone Encounter (Signed)
 Does the patient have any of the following red flag symptoms?Chest pain NoSevere abdominal pain NoShortness of breath NoStroke symptoms (facial droop, confusion, weakness/numbness, abnormal speech)  NoCoughing up blood NoSuicidal thoughts NoLoss of consciousness/passing out NoInability to tolerate oral intake or tube feeds NoNo urination in the past 12 hours NoSignificant caregiver convern NoIf yes to any of the above, call should be transferred directly to a nurse for warm handoff and urgent triage.If no, is there availability within the next 24 hours? Yes -- patient booked for evaluation. (please schedule in the following order: open spots on provider schedules-->Acute schedule) Angel Wells states she feels patients toe has worsened. Angel states patient has a scheduled appointment for a dental at 4 PM today. Angel requests available provider examine patients toe to confirm integrity of patients skin. Name of caller: Angel for Angel AgostoRelationship to patient: PDNPhone: [phone]

## 2024-06-13 ENCOUNTER — Encounter: Payer: Self-pay | Admitting: Student in an Organized Health Care Education/Training Program

## 2024-06-13 NOTE — Progress Notes (Signed)
 Outgoing paperwork:Completed paperwork received on  12/5/2025Sent to scanning? YesSent as outlined in prior documentation: [phone] Rollene Moses3:57 PM12/11/2023

## 2024-06-13 NOTE — Telephone Encounter (Signed)
 [facility] TELEPHONE INTAKEReason for call: Mallie request a call back about patient's toe and the pics she sent via mychart.Name of caller: KateRelationship to patient: nurseOrganization: Phone:  [phone]:

## 2024-06-13 NOTE — Telephone Encounter (Addendum)
 Spoke to Dr. Lawernce.Plan to keep site clean and dry.Cover with band-aid.Call with changes.Spoke to [place], CHARITY FUNDRAISER.Relayed update.Acknowledged understanding.Letter written and sent to [facility] to [facility] as well.Nurse core:Fax #:  [phone]

## 2024-06-13 NOTE — Progress Notes (Signed)
 Outgoing paperwork:Completed paperwork received on  12/5/2025Sent to scanning? YesSent as outlined in prior documentation: [phone] Angel Moses3:55 PM12/11/2023

## 2024-06-13 NOTE — Telephone Encounter (Signed)
 Spoke to [place], CHARITY FUNDRAISER.Discussed open wound on right pinky toe.About a week.Unsure how this happened.Sent image via nurse cell phone.Discussed appropriateness of cell phone use, as request by [facility] nurses only.Acknowledged understanding.Will show to Dr. Lawernce as there are limited clinic appts.Pt consents to send and receive text messages via the nurse phone at [facility]. Pt may send secure patient information such as forms, pictures, and BG logs to the [email or web address] if unable to send via mychart.The Smart phone hours of operation are M-F 8:30-4:30All voice calls will forward to the main office phone since the smart phone is not meant for receiving calls. [facility] smartphone is for work use only.

## 2024-06-13 NOTE — Progress Notes (Signed)
 Incoming paperwork for completion to the [facility] CenterDate Received:Date Paperwork was Intake: 06/13/2024 Type of paperwork: Sender/organization: [facility] Form type: Written order for 16 itemsCompleted paperwork to be sent to:  Fax to [phone] in box for: HardyStephanie Montulli8:27 AM12/11/2023

## 2024-06-14 MED ORDER — INCONTINENCE SUPPLY DISPOSABLE MISC *A*: 6 refills | Status: AC

## 2024-06-16 ENCOUNTER — Encounter: Payer: Self-pay | Admitting: Student in an Organized Health Care Education/Training Program

## 2024-06-16 ENCOUNTER — Telehealth: Payer: Self-pay | Admitting: Student in an Organized Health Care Education/Training Program

## 2024-06-16 DIAGNOSIS — R197 Diarrhea, unspecified: Secondary | ICD-10-CM

## 2024-06-16 NOTE — Telephone Encounter (Signed)
 [facility] TELEPHONE INTAKEReason for call: Nurse returned call. Requesting a call back to know plan of care. States stool sample has been collected.Name of caller: gailRelationship to patient: nurseOrganization: Phone:  [phone]:

## 2024-06-16 NOTE — Telephone Encounter (Signed)
 Called and spoke to [employer] pt has been having loose stools for about 1 weekStates stools are very largePt also complaining of frequent gasPt has history of c diffStates pt is hydrating wellDenies fevers,  loss of appetite, abdominal pain/distensionRequesting stool sample to test for c diffAdvised to call office with any questions, concerns, or acute changesAcknowledged understandingWill route to PCP

## 2024-06-16 NOTE — Telephone Encounter (Signed)
Faxed to [employer]

## 2024-06-16 NOTE — Progress Notes (Signed)
 Incoming paperwork for completion to the [facility] CenterDate Received:Date Paperwork was Intake: 06/16/2024 Type of paperwork: Sender/organization: NurseCoreForm type: Home Health Certification & Plan of Care Completed paperwork to be sent to:  Fax to [phone] in box for: HardyLauren N Snyder1:11 PM12/02/2024

## 2024-06-16 NOTE — Telephone Encounter (Signed)
 Attempted to call pt  No answer  LVM  Call back number provided    Loann Quill, RN

## 2024-06-16 NOTE — Telephone Encounter (Signed)
 [facility] TELEPHONE INTAKEReason for call: Mallie requesting an order for stool test, states patient has symptoms of CC.  Diff.Name of caller: KateRelationship to patient: nurseOrganization: Phone:  [phone]:

## 2024-06-17 ENCOUNTER — Encounter: Payer: Self-pay | Admitting: Gastroenterology

## 2024-06-17 NOTE — Addendum Note (Signed)
 Addended by: LAWERNCE FALLOW on: 06/17/2024 02:05 PM Modules accepted: Orders

## 2024-06-17 NOTE — Telephone Encounter (Signed)
 Called and spoke to [place].Relayed that stool test had been ordered. Verbalized understanding. No further needs voiced. Sadiq Mccauley, RN

## 2024-06-18 NOTE — Progress Notes (Signed)
 Outgoing paperwork:Completed paperwork received on  12/10/2025Sent to scanning? YesSent as outlined in prior documentation: Fax to [phone] N Snyder9:29 AM12/04/2024

## 2024-06-20 ENCOUNTER — Other Ambulatory Visit: Payer: Self-pay | Admitting: Student in an Organized Health Care Education/Training Program

## 2024-06-20 ENCOUNTER — Other Ambulatory Visit: Payer: Self-pay | Admitting: Neurology

## 2024-06-20 DIAGNOSIS — G40219 Localization-related (focal) (partial) symptomatic epilepsy and epileptic syndromes with complex partial seizures, intractable, without status epilepticus: Secondary | ICD-10-CM

## 2024-06-20 MED ORDER — LACOSAMIDE 10 MG/ML PO SOLN *I*: ORAL | 5 refills | Status: AC

## 2024-06-20 NOTE — Telephone Encounter (Signed)
 [place]ed, medication was last dispensed 05/21/24

## 2024-06-25 ENCOUNTER — Other Ambulatory Visit: Payer: Self-pay | Admitting: Student in an Organized Health Care Education/Training Program

## 2024-06-25 MED ORDER — CALCIUM CARBONATE ANTACID 1000 MG PO CHEW *I*: 1000.0000 mg | CHEWABLE_TABLET | Freq: Every day | ORAL | 0 refills | Status: DC

## 2024-07-04 ENCOUNTER — Other Ambulatory Visit: Payer: Self-pay | Admitting: Student in an Organized Health Care Education/Training Program

## 2024-07-04 DIAGNOSIS — J45909 Unspecified asthma, uncomplicated: Secondary | ICD-10-CM

## 2024-07-04 DIAGNOSIS — Z8701 Personal history of pneumonia (recurrent): Secondary | ICD-10-CM

## 2024-07-04 MED ORDER — ALBUTEROL SULFATE (2.5 MG/3ML) 0.083% IN NEBU *I*: INHALATION_SOLUTION | RESPIRATORY_TRACT | 3 refills | Status: AC

## 2024-07-11 ENCOUNTER — Ambulatory Visit: Attending: Student in an Organized Health Care Education/Training Program

## 2024-07-11 ENCOUNTER — Other Ambulatory Visit: Payer: Self-pay

## 2024-07-11 DIAGNOSIS — G8250 Quadriplegia, unspecified: Secondary | ICD-10-CM | POA: Insufficient documentation

## 2024-07-11 DIAGNOSIS — Z87820 Personal history of traumatic brain injury: Secondary | ICD-10-CM | POA: Insufficient documentation
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
## 2024-07-16 ENCOUNTER — Other Ambulatory Visit: Payer: Self-pay | Admitting: Student in an Organized Health Care Education/Training Program

## 2024-07-16 DIAGNOSIS — Z9911 Dependence on respirator [ventilator] status: Secondary | ICD-10-CM

## 2024-07-16 DIAGNOSIS — J9600 Acute respiratory failure, unspecified whether with hypoxia or hypercapnia: Secondary | ICD-10-CM

## 2024-07-21 ENCOUNTER — Other Ambulatory Visit: Payer: Self-pay | Admitting: Student in an Organized Health Care Education/Training Program

## 2024-07-21 DIAGNOSIS — M2450 Contracture, unspecified joint: Secondary | ICD-10-CM

## 2024-07-23 ENCOUNTER — Other Ambulatory Visit: Payer: Self-pay | Admitting: Student in an Organized Health Care Education/Training Program

## 2024-07-23 DIAGNOSIS — E1165 Type 2 diabetes mellitus with hyperglycemia: Secondary | ICD-10-CM

## 2024-07-23 DIAGNOSIS — Z794 Long term (current) use of insulin: Secondary | ICD-10-CM

## 2024-07-23 DIAGNOSIS — E119 Type 2 diabetes mellitus without complications: Secondary | ICD-10-CM

## 2024-07-25 ENCOUNTER — Other Ambulatory Visit
Admission: RE | Admit: 2024-07-25 | Discharge: 2024-07-25 | Disposition: A | Source: Ambulatory Visit | Attending: Student in an Organized Health Care Education/Training Program | Admitting: Student in an Organized Health Care Education/Training Program

## 2024-07-25 DIAGNOSIS — E1165 Type 2 diabetes mellitus with hyperglycemia: Secondary | ICD-10-CM | POA: Insufficient documentation

## 2024-07-27 LAB — HEMOGLOBIN A1C: Hemoglobin A1C: 5.6 % (ref <=5.6)

## 2024-07-28 ENCOUNTER — Other Ambulatory Visit: Payer: Self-pay | Admitting: Student in an Organized Health Care Education/Training Program

## 2024-07-29 ENCOUNTER — Ambulatory Visit: Payer: Self-pay | Admitting: Student in an Organized Health Care Education/Training Program

## 2024-08-01 ENCOUNTER — Ambulatory Visit: Admitting: Student in an Organized Health Care Education/Training Program

## 2024-08-01 ENCOUNTER — Telehealth: Payer: Self-pay | Admitting: Neurology

## 2024-08-01 DIAGNOSIS — J9611 Chronic respiratory failure with hypoxia: Secondary | ICD-10-CM

## 2024-08-01 DIAGNOSIS — G40219 Localization-related (focal) (partial) symptomatic epilepsy and epileptic syndromes with complex partial seizures, intractable, without status epilepticus: Secondary | ICD-10-CM

## 2024-08-01 DIAGNOSIS — E119 Type 2 diabetes mellitus without complications: Secondary | ICD-10-CM

## 2024-08-01 DIAGNOSIS — Z87820 Personal history of traumatic brain injury: Secondary | ICD-10-CM

## 2024-08-01 DIAGNOSIS — Z794 Long term (current) use of insulin: Secondary | ICD-10-CM

## 2024-08-01 DIAGNOSIS — E1165 Type 2 diabetes mellitus with hyperglycemia: Secondary | ICD-10-CM

## 2024-08-01 MED ORDER — ACCU-CHEK GUIDE VI STRP *A*: ORAL_STRIP | 5 refills | Status: AC

## 2024-08-01 MED ORDER — SILACE 150 MG/15ML PO LIQD: 100.0000 mg | Freq: Three times a day (TID) | ORAL | 5 refills | Status: AC

## 2024-08-01 NOTE — Progress Notes (Signed)
 [facility] Medication Management Angel Wells is a 34 y.o. male who presents for follow-up visit with the clinical pharmacist. Method of visit: Video. Patient's medications, preferred pharmacy and allergies were updated and marked as reviewed, as appropriate.        There were no vitals filed for this visit. Additional labs/vitals as documented in chart - reviewed all relevant objective info at time of visit.Current Outpatient Medications Medication Sig Dispense Refill  ANTACID ULTRA STRENGTH 1000 MG chewable tablet place 1 tablet (1,000 MILLIGRAM total) into mouth, chew and swallow daily. 30 tablet 0  clobetasol  (TEMOVATE ) 0.05 % ointment apply two times a day to the affected area. do not use for more than 2 weeks at a time. 45 g 0  bisacodyl  (DULCOLAX) 10 mg suppository please give suppository as needed if no bowel movement in 3 days, please check with dad prior to giving 12 each 0  blood glucose (ACCU-CHEK GUIDE) test strip USE 1 STRIP 3 TIMES A DAY 100 each 5  baclofen  (LIORESAL ) 10 mg tablet take 1 tablet by mouth 2 times daily 180 tablet 0  sodium chloride  0.9 % nebulizer solution inhale 5 mls by nebulization 3 times daily 500 mL 2  Lactulose 20 gm/30mL solution administer 30 mls (20 g total) via g tube daily as needed (no stool in 48 hours) use as directed 946 mL 5  levETIRAcetam  (KEPPRA ) 100 mg/mL solution give 15 milliliter by g-tube twice daily 900 mL 5  albuterol  (PROVENTIL ) (2.5 mg/85mL) 0.083% nebulizer solution INHALE 3 MLS BY NEBULIZATION 4 TIMES DAILY FOR SPASM OF LUNG AIR PASSAGES WITH VESTING. WHEN ILL, USE FOUR TIMES A DAY WITH VESTING. 360 each 3  cetirizine  (ZYRTEC ) 10 mg tablet administer 1 tablet (10 milligram total) via g tube nightly. 90 tablet 1  guaifenesin  (HUMIBID E) 400 mg TABS take 1 tablet by mouth 3 times daily. 90 tablet 1  lacosamide  (VIMPAT ) 10 mg/mL SOLN oral solution Take 10 MILLILITER per tube  twice daily. ; MAXIMUM DAILY DOSE 20 MILLILITER 600 mL 5  incontinence supply disposable Adult Disposable Pull-Up Briefs; Use as directed as needed. Diagnosis code: N39.46, G82.50 250 each 6  incontinence supply disposable Disposable Liners; Use as directed as needed. Diagnosis code: N39.46, G82.50 250 each 6  metFORMIN  (GLUCOPHAGE ) 500 MG/5ML solution Administer 10 mls (1,000 milligram total) via g tube 2 times daily (with meals) 473 mL 5  canagliflozin  (INVOKANA ) 300 mg tablet Take 1 tablet by mouth daily (before breakfast). 30 tablet 5  azelastine  (ASTELIN ) 0.1 % nasal spray Apply 2 sprays in each nostril every morning and then in the afternoon as needed 30 mL 5  zinc  oxide (DESITIN) 40 % paste Apply topically as needed (Skin irritation. Use up to 4x a day). 113 g 1  generic DME Ok to use tylenol  just prior to sleep.    Lancet Devices (SIMPLE DIAGNOSTICS LANCING DEV) MISC Use to test blood sugar 3 times daily. Diagnosis code: E11.9 - Type 2 diabetes mellitus. Brand: Fast Clix Lancet Device 90 each 2  SENNA 8.6 MG tablet administer 2 tablets via g tube daily 180 tablet 1  Accu-Chek FastClix Lancets MISC 1 EACH BY MISC.(NON-DRUG COMBO ROUTE) ROUTE 3 (THREE) TIMES DAILY. E11.65 102 each 5  nystatin  (MYCOSTATIN ) 100,000 Units/mL suspension Swish and swallow 5 mLs (500,000 units total) 4 times daily for Candidiasis Fungal Infection of the Oropharynx. Swish and hold in mouth before swallowing. Use for at least 7 days until symptoms resolve. 80 mL 1  fluticasone  (FLONASE ) 50 MCG/ACT nasal spray spray 1 spray into nostril daily 16 mL 2  generic DME Item to dispense: [facility] Bed; Use as directed as needed. Diagnosis codes: G82.50, Z87.820, Z99.11 1 each 0  generic DME Item to dispense: Hospital bed. (Current hospital bed is broken without ability to repair).  Instructions: use for diagnosis of quadriplegia. 1 each 0  ketoconazole  (NIZORAL ) 2 % cream apply topically daily. to  the following areas: right and left armpit 60 g 5  valproate (VALPROIC ACID ) 50 mg/mL oral solution take 5 MILLILITER by mouth 3 times daily 1350 mL 5  SILACE 150 MG/15ML syrup take 10 mls by mouth 3 times daily hold for loose stool 900 mL 5  famotidine  (PEPCID ) 20 mg tablet take 1 tablet by mouth 2 times daily. ok to crush it and put it down tube if necessary 180 tablet 3  EPINEPHrine  (EPIPEN ) 0.3 mg/0.3 mL auto-injector use as directed 2 each 3  ipratropium (ATROVENT ) 0.02 % nebulizer solution NEBULIZE 1 UNIT DOSE THREE TIMES A DAY, MAY GIVE 1 ADDITIONAL DOSE ONCE DAILY IS NEEDED FOR WHEEZING/COUGH/RESPIRATORY DISTRESS 312.5 mL 5  surgical lubricant (SURGILUBE) gel Apply topically as needed (trach changes). 31 g 2  carbamide peroxide (DEBROX) 6.5 % otic solution use 5 drops twice daily on the first 3 days of each month. 15 mL 2  generic DME Item to dispense: Item to dispense: Bivona silicone cuffed trach tube size 6.0    Instructions for use: for back up use in case of difficult insertion to maintain airway 1 each 0  chlorhexidine  (PERIDEX ) 0.12 % solution use as directed 473 mL 0  budesonide  (PULMICORT ) 0.25 mg/78mL nebulizer suspension Inhale 2 mLs (0.25 mg total) by nebulization 2 times daily. 360 mL 5  nystatin  (MYCOSTATIN ) ointment apply topically with each diaper change as needed for diaper rash. to the following areas: gtube site after mild cleansing 15 g 0  nystatin  (MYCOSTATIN ) powder apply topically 4 times daily to the following areas: neck, groin, axilla 30 g 0  miconazole  (MICATIN) 2 % powder Apply topically 2 times daily as needed. 71 g 0  MIC-KEY gastrostomy kit 14FR Please dispense every 3 months and prn, MicKey 14Fr 2.5cm 1 kit 3  MIC-KEY gastrostomy feeding tube extension set 24 Use as directed, please dispense 4 per month 4 kit 5  clonazePAM  (KLONOPIN ) 1 mg tablet take 1 tablet by mouth twice daily as needed (crush for g-tube) for seizures; maximum daily dose 2  tabs 10 tablet 0  Multiple Vitamins-Minerals (MULTIVITAMIN GUMMIES ADULTS PO) Take by mouth daily. 2 gummies one a day nutritional supplement    B-D UF III MINI PEN NEEDLES 31G X 5 MM USE 3 (THREE) TIMES DAILY AS NEEDED. 100 each 5  Glucagon  (GVOKE HYPOPEN  1-PACK) 1 MG/0.2ML SOAJ for Disorder with Low Blood Sugar. Use as needed for blood glucose < 70 1 each 5  lactobacillus rhamnosus, GG, (CULTURELLE) capsule Take 2 capsules (2 each total) by mouth daily. 30 capsule 5  Probiotic Product (PROBIOTIC GUMMIES) 30 MG CHEW Take 30 mg by mouth daily. 2  tablets/gummies as provided by family-digestive health    EPINEPHrine  (EPIPEN  JR) 0.15 mg/0.3 mL auto-injector Inject 0.3 mLs (0.15 mg total) into the muscle as needed for Anaphylaxis.    sodium chloride  (OCEAN) 0.65 % nasal spray Spray 1 spray into each nostril 2 times daily as needed for Congestion 45 mL 5  generic DME Item to dispense: Bivona silicone cuffed trach tube size 7 80  mm length  Instructions for use: Use as directed 2 each 5  generic DME Vent settings:SIMV/PC/PS, RATE 10 IP 21. PS 13, PEEP 7, HIGH PRESSURE 50, LOW PRESSURE 5, HIGH RR 40, LOW RR14 1 each 0  oxygen  1-2 L/min to keep O2 sat above 92%, may increase up to 4 lpm as needed.  Low saturation alarm 85%, high alarm off, low HR 50, High HR 200 1 each 0  generic DME [place] Percussion Vest Model # 105  55inchUse as directed. 1 each 0  cholecalciferol (VITAMIN D ) 1,000 unit tablet Take 1 tablet (1,000 units total) by mouth daily. 2 chews or tablet via G-tube- for total dose of 2,000 units.    Ascorbic Acid (VITAMIN C ADULT GUMMIES PO) Take 250 mg by mouth daily  Give 2 gummies once daily     insulin  pen needle (FIFTY50 PEN NEEDLES) 31G X 8 MM 1 each   No current facility-administered medications for this visit.   Assessment and Plan:With regards to medication management for this patient, the following aspects were reviewed: Indication, effectiveness, safety and  convenience of each medication. The patient's medical conditions and medications were assessed, evaluated, and deemed meeting goals of drug therapy except as listed below: Type 2 DiabetesThe patient's last A1C on 07/25/24 = 5.6%, the patient is: at goal. Fasting/post-prandial BG levels at goal. Current antihyperglycemic therapy of Metformin  1000 mg BID + Canagliflozin  300mg  daily is appropriate.Fasting sugars= 130-180mg /dL but mostly the BG's are in the 130sLunch time PP (1-2p)= 120-150mg /dLDinner time PP (5-6p)=120-150mg /dLNeeds refills for Accu check test strips, pendedDiscussed cutting back monitoring to just once weekly and collecting a fasting BGPatient was able to verbalize understanding and repeat back key educational points. Follow-up: Next PCP visit Thank you for allowing me to participate in the care of this patient. Please contact me with any questions. Oscar Rosa, PharmD., BCCCPClinical Pharmacist Practitioner, CPPUR Medicine-Complex Care [address], [place], [place] [phone]

## 2024-08-01 NOTE — Progress Notes (Signed)
 [facility]ENTERTELEMEDICINE VISITCHIEF COMPLAINT No chief complaint on file.Subjective TELEMEDICINE CONSENT Visit being conducted by Video Location of Patient: HomeLocation of Telemedicine Provider: ClinicOther Participants in telemedicine encounter and roles: nursing, parentsConsent was obtained from the patient to complete this telemedicine visit; including the potential for financial liability.How did the patient provide consent? Verbal Consent OnlySUBJECTIVE Seizures- having an increase in seizure incidence- no identifiable trigger- often gets worse during change in season, but hard to tell- denies fevers, trauma, change in medications, change in diet- seeing neurology in May DM2- A1C UTD, stable- minimal concerns regarding sugar management- denies symptoms of hypoglycemiaHx traumatic brain injury- vented, no acute concerns but has not seen Dr. Krol in awhile- would like an order to use night splints on bilateral legs and increase as tolerated MEDICATIONS Current Outpatient Medications Medication Sig  generic DME Use resting night splints on bilateral feed, starting at 1-2 hours and gradually increasing as tolerated  blood glucose (ACCU-CHEK GUIDE) test strip USE 1 STRIP 3 TIMES A DAY  Docusate Sodium  (SILACE) 150 MG/15ML syrup Administer 10 mLs (100 mg total) via G tube 3 times daily.  ANTACID ULTRA STRENGTH 1000 MG chewable tablet place 1 tablet (1,000 MILLIGRAM total) into mouth, chew and swallow daily.  clobetasol  (TEMOVATE ) 0.05 % ointment apply two times a day to the affected area. do not use for more than 2 weeks at a time.  bisacodyl  (DULCOLAX) 10 mg suppository please give suppository as needed if no bowel movement in 3 days, please check with dad prior to giving  baclofen  (LIORESAL ) 10 mg tablet take 1 tablet by mouth 2 times daily  sodium chloride  0.9 % nebulizer solution inhale 5 mls  by nebulization 3 times daily  Lactulose 20 gm/30mL solution administer 30 mls (20 g total) via g tube daily as needed (no stool in 48 hours) use as directed  levETIRAcetam  (KEPPRA ) 100 mg/mL solution give 15 milliliter by g-tube twice daily  albuterol  (PROVENTIL ) (2.5 mg/3mL) 0.083% nebulizer solution INHALE 3 MLS BY NEBULIZATION 4 TIMES DAILY FOR SPASM OF LUNG AIR PASSAGES WITH VESTING. WHEN ILL, USE FOUR TIMES A DAY WITH VESTING.  cetirizine  (ZYRTEC ) 10 mg tablet administer 1 tablet (10 milligram total) via g tube nightly.  guaifenesin  (HUMIBID E) 400 mg TABS take 1 tablet by mouth 3 times daily.  lacosamide  (VIMPAT ) 10 mg/mL SOLN oral solution Take 10 MILLILITER per tube twice daily. ; MAXIMUM DAILY DOSE 20 MILLILITER  incontinence supply disposable Adult Disposable Pull-Up Briefs; Use as directed as needed. Diagnosis code: N39.46, G82.50  incontinence supply disposable Disposable Liners; Use as directed as needed. Diagnosis code: N39.46, G82.50  metFORMIN  (GLUCOPHAGE ) 500 MG/5ML solution Administer 10 mls (1,000 milligram total) via g tube 2 times daily (with meals)  canagliflozin  (INVOKANA ) 300 mg tablet Take 1 tablet by mouth daily (before breakfast).  azelastine  (ASTELIN ) 0.1 % nasal spray Apply 2 sprays in each nostril every morning and then in the afternoon as needed  zinc  oxide (DESITIN) 40 % paste Apply topically as needed (Skin irritation. Use up to 4x a day).  generic DME Ok to use tylenol  just prior to sleep.  Lancet Devices (SIMPLE DIAGNOSTICS LANCING DEV) MISC Use to test blood sugar 3 times daily. Diagnosis code: E11.9 - Type 2 diabetes mellitus. Brand: Fast Clix Lancet Device  SENNA 8.6 MG tablet administer 2 tablets via g tube daily  Accu-Chek FastClix Lancets MISC 1 EACH BY MISC.(NON-DRUG COMBO ROUTE) ROUTE 3 (THREE) TIMES DAILY. E11.65  nystatin  (MYCOSTATIN ) 100,000 Units/mL suspension  Swish and swallow 5 mLs (500,000 units total) 4 times daily for Candidiasis  Fungal Infection of the Oropharynx. Swish and hold in mouth before swallowing. Use for at least 7 days until symptoms resolve.  fluticasone  (FLONASE ) 50 MCG/ACT nasal spray spray 1 spray into nostril daily  generic DME Item to dispense: [facility] Bed; Use as directed as needed. Diagnosis codes: G82.50, Z87.820, Z99.11  generic DME Item to dispense: Hospital bed. (Current hospital bed is broken without ability to repair).  Instructions: use for diagnosis of quadriplegia.  ketoconazole  (NIZORAL ) 2 % cream apply topically daily. to the following areas: right and left armpit  valproate (VALPROIC ACID ) 50 mg/mL oral solution take 5 MILLILITER by mouth 3 times daily  famotidine  (PEPCID ) 20 mg tablet take 1 tablet by mouth 2 times daily. ok to crush it and put it down tube if necessary  EPINEPHrine  (EPIPEN ) 0.3 mg/0.3 mL auto-injector use as directed  ipratropium (ATROVENT ) 0.02 % nebulizer solution NEBULIZE 1 UNIT DOSE THREE TIMES A DAY, MAY GIVE 1 ADDITIONAL DOSE ONCE DAILY IS NEEDED FOR WHEEZING/COUGH/RESPIRATORY DISTRESS  surgical lubricant (SURGILUBE) gel Apply topically as needed (trach changes).  carbamide peroxide (DEBROX) 6.5 % otic solution use 5 drops twice daily on the first 3 days of each month.  generic DME Item to dispense: Item to dispense: Bivona silicone cuffed trach tube size 6.0    Instructions for use: for back up use in case of difficult insertion to maintain airway  chlorhexidine  (PERIDEX ) 0.12 % solution use as directed  budesonide  (PULMICORT ) 0.25 mg/58mL nebulizer suspension Inhale 2 mLs (0.25 mg total) by nebulization 2 times daily.  nystatin  (MYCOSTATIN ) ointment apply topically with each diaper change as needed for diaper rash. to the following areas: gtube site after mild cleansing  nystatin  (MYCOSTATIN ) powder apply topically 4 times daily to the following areas: neck, groin, axilla  miconazole  (MICATIN) 2 % powder Apply topically 2 times daily as  needed.  MIC-KEY gastrostomy kit 14FR Please dispense every 3 months and prn, MicKey 14Fr 2.5cm  MIC-KEY gastrostomy feeding tube extension set 24 Use as directed, please dispense 4 per month  clonazePAM  (KLONOPIN ) 1 mg tablet take 1 tablet by mouth twice daily as needed (crush for g-tube) for seizures; maximum daily dose 2 tabs  Multiple Vitamins-Minerals (MULTIVITAMIN GUMMIES ADULTS PO) Take by mouth daily. 2 gummies one a day nutritional supplement  B-D UF III MINI PEN NEEDLES 31G X 5 MM USE 3 (THREE) TIMES DAILY AS NEEDED.  Glucagon  (GVOKE HYPOPEN  1-PACK) 1 MG/0.2ML SOAJ for Disorder with Low Blood Sugar. Use as needed for blood glucose < 70  lactobacillus rhamnosus, GG, (CULTURELLE) capsule Take 2 capsules (2 each total) by mouth daily.  Probiotic Product (PROBIOTIC GUMMIES) 30 MG CHEW Take 30 mg by mouth daily. 2  tablets/gummies as provided by family-digestive health  EPINEPHrine  (EPIPEN  JR) 0.15 mg/0.3 mL auto-injector Inject 0.3 mLs (0.15 mg total) into the muscle as needed for Anaphylaxis.  sodium chloride  (OCEAN) 0.65 % nasal spray Spray 1 spray into each nostril 2 times daily as needed for Congestion  generic DME Item to dispense: Bivona silicone cuffed trach tube size 7 80 mm length  Instructions for use: Use as directed  generic DME Vent settings:SIMV/PC/PS, RATE 10 IP 21. PS 13, PEEP 7, HIGH PRESSURE 50, LOW PRESSURE 5, HIGH RR 40, LOW RR14  oxygen  1-2 L/min to keep O2 sat above 92%, may increase up to 4 lpm as needed.  Low saturation alarm 85%, high alarm off, low  HR 50, High HR 200  generic DME [employer] Percussion Vest Model # 105  55inchUse as directed.  cholecalciferol (VITAMIN D ) 1,000 unit tablet Take 1 tablet (1,000 units total) by mouth daily. 2 chews or tablet via G-tube- for total dose of 2,000 units.  Ascorbic Acid (VITAMIN C ADULT GUMMIES PO) Take 250 mg by mouth daily  Give 2 gummies once daily   insulin  pen needle (FIFTY50 PEN NEEDLES) 31G X 8 MM 1  each Medications reviewed at today's visit Objective OBJECTIVE PHYSICAL EXAMThe physical exam was limited to my Video observation of this patient's organ systems and/or body areas. GEN:  alert, no acute distress, answering questions at his baseline  ASSESSMENT Today we discussed the assessment and management of the following diagnosis:  ICD-10-CM ICD-9-CM 1. Type 2 diabetes mellitus  E11.9 250.00 2. Type 2 diabetes mellitus with hyperglycemia  E11.65 250.00 3. Current use of insulin   Z79.4 V58.67 4. Chronic respiratory failure with hypoxia  J96.11 518.83   799.02 5. History of traumatic brain injury  Z87.820 V15.52 6. 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 Type 2 diabetes mellitus- continue current management- decrease BG checks to twice weekly, Monday and Thursday - repeat A1C in 6 months 2. Seizures- no identifiable trigger- advised they keep neurology informed in case he needs earlier follow up, advised a seizure journal if they do not have one currently- advised to confirm rescue plan with neurology Orders Placed This Encounter  blood glucose (ACCU-CHEK GUIDE) test strip  Docusate Sodium  (SILACE) 150 MG/15ML syrup  generic DME There are no Patient Instructions on file for this visit. I personally spent 30 minutes, on the calendar day of the encounter, including PreVisit, Visit, and Post visit work, in the care of this patient.Follow up in about 3 months (around 10/30/2024) for Diabetes.Elsie Mayotte, DO

## 2024-08-01 NOTE — Telephone Encounter (Signed)
 Called and spoke with Mallie PEAK. She states patient has had an increase in seizure frequency and duration this month. Per Mallie, he usually has 10 or less per month (typically in the morning) and they usually last 8 seconds at a time. This month he's had 24 with some occurring in evenings and lasting upwards of 20 seconds occasionally. Seizures are still self limiting and she believes patient returns to baseline within a few minutes. Mallie states we can't think of anything contributing to this- denies any missed medication doses, recent illness, changes in intake, sleep. She would like FUV with Dr. Rolan Wednesday, Thursday or Friday of next week if possible to discuss. Writer advised to call this office back or seek ED evaluation with seizures that won't break or if patient does not return to baseline. Routing to providers for further advisement.

## 2024-08-01 NOTE — Telephone Encounter (Signed)
 I called and spoke with Mallie this evening.  Abdur is doing well currently, no fevers, interacting at baseline, eating dinner currently.  He gets seizure increasing prior to getting sick but no signs so far.  They have an order for Klonopin  if he has more than three seizures in an hour but they've not needed to use that.I recommended that we repeat some blood work, including drug levels as we do have room to increase the lacosamide  and potentially Depakote (although his LFTs are rising so would favor the lacosamide  as the next step).  Mallie would strongly prefer to come in and see Dr. Rolan late next week and have the blood work drawn same day.  I will look into his availability.  I did place the lab orders in case they decide to bring Frantz in to the outpatient lab before then.  We reviewed when and how to use the clonazepam  as a rescue treatment and that we are available over the weekend and to call us  back if things continue to worsen.  Mallie expressed understanding and was appreciative of my call.

## 2024-08-01 NOTE — Telephone Encounter (Signed)
 Patient Telephone Call - SymptomsCaller name: Mallie home nurse Relationship to patient: CaregiverCallback number: [phone] Symptom(s): Patient has twice in number of seizure episodes even patient is taking all medications on time. Mallie requests a call back.When did the symptom(s) start?: More than 3 days ago Have these symptoms been previously reported or treated by Neurology?: No

## 2024-08-05 NOTE — Telephone Encounter (Signed)
Noted. Thank you for your help.

## 2024-08-07 ENCOUNTER — Telehealth: Payer: Self-pay | Admitting: Student in an Organized Health Care Education/Training Program

## 2024-08-07 NOTE — Telephone Encounter (Signed)
 [facility] TELEPHONE TRIAGE Reason for call: Mallie wants to know the status on night splint. The patient had a visit on 08/01/24, and there was talk about a resting night splint. Name of caller: Mallie for Angel AgostoRelationship to patient: RNOrganization: Nurse CorePhone: [phone]

## 2024-08-08 ENCOUNTER — Ambulatory Visit

## 2024-08-12 ENCOUNTER — Other Ambulatory Visit: Payer: Self-pay | Admitting: Student in an Organized Health Care Education/Training Program

## 2024-08-12 MED ORDER — GENERIC DME *A*: Status: AC

## 2024-08-14 NOTE — Telephone Encounter (Signed)
 Writer received another call still waiting for feed back on night splint.

## 2024-08-15 ENCOUNTER — Ambulatory Visit

## 2024-08-27 ENCOUNTER — Ambulatory Visit: Admitting: Neurology

## 2024-09-12 ENCOUNTER — Ambulatory Visit: Admitting: Optometrist
